# Patient Record
Sex: Female | Born: 1935
Health system: Southern US, Community
[De-identification: ages and names within clinical notes are randomized; demographics above are authoritative.]

## PROBLEM LIST (undated history)

## (undated) DIAGNOSIS — G20 Parkinson's disease: Secondary | ICD-10-CM

## (undated) DIAGNOSIS — I10 Essential (primary) hypertension: Secondary | ICD-10-CM

## (undated) DIAGNOSIS — F419 Anxiety disorder, unspecified: Secondary | ICD-10-CM

## (undated) DIAGNOSIS — F329 Major depressive disorder, single episode, unspecified: Secondary | ICD-10-CM

## (undated) DIAGNOSIS — K219 Gastro-esophageal reflux disease without esophagitis: Secondary | ICD-10-CM

## (undated) DIAGNOSIS — F32A Depression, unspecified: Secondary | ICD-10-CM

## (undated) DIAGNOSIS — G20A1 Parkinson's disease without dyskinesia, without mention of fluctuations: Secondary | ICD-10-CM

## (undated) DIAGNOSIS — E119 Type 2 diabetes mellitus without complications: Secondary | ICD-10-CM

## (undated) DIAGNOSIS — H9190 Unspecified hearing loss, unspecified ear: Secondary | ICD-10-CM

## (undated) DIAGNOSIS — C8590 Non-Hodgkin lymphoma, unspecified, unspecified site: Secondary | ICD-10-CM

## (undated) HISTORY — PX: MASTECTOMY: SHX3

## (undated) HISTORY — DX: Gastro-esophageal reflux disease without esophagitis: K21.9

## (undated) HISTORY — DX: Unspecified hearing loss, unspecified ear: H91.90

## (undated) HISTORY — PX: OTHER SURGICAL HISTORY: SHX169

## (undated) HISTORY — DX: Type 2 diabetes mellitus without complications: E11.9

---

## 1998-03-20 ENCOUNTER — Other Ambulatory Visit: Admission: RE | Admit: 1998-03-20 | Discharge: 1998-03-20 | Payer: Self-pay | Admitting: Obstetrics and Gynecology

## 1998-10-31 ENCOUNTER — Ambulatory Visit (HOSPITAL_BASED_OUTPATIENT_CLINIC_OR_DEPARTMENT_OTHER): Admission: RE | Admit: 1998-10-31 | Discharge: 1998-10-31 | Payer: Self-pay | Admitting: Otolaryngology

## 1999-03-27 ENCOUNTER — Other Ambulatory Visit: Admission: RE | Admit: 1999-03-27 | Discharge: 1999-03-27 | Payer: Self-pay | Admitting: Obstetrics and Gynecology

## 1999-08-14 ENCOUNTER — Ambulatory Visit (HOSPITAL_COMMUNITY): Admission: RE | Admit: 1999-08-14 | Discharge: 1999-08-14 | Payer: Self-pay | Admitting: Family Medicine

## 1999-08-14 ENCOUNTER — Encounter: Payer: Self-pay | Admitting: Family Medicine

## 1999-08-25 ENCOUNTER — Encounter: Payer: Self-pay | Admitting: *Deleted

## 1999-08-25 ENCOUNTER — Ambulatory Visit (HOSPITAL_COMMUNITY): Admission: RE | Admit: 1999-08-25 | Discharge: 1999-08-25 | Payer: Self-pay | Admitting: *Deleted

## 2000-04-02 ENCOUNTER — Other Ambulatory Visit: Admission: RE | Admit: 2000-04-02 | Discharge: 2000-04-02 | Payer: Self-pay | Admitting: Obstetrics and Gynecology

## 2000-04-29 ENCOUNTER — Ambulatory Visit (HOSPITAL_COMMUNITY): Admission: RE | Admit: 2000-04-29 | Discharge: 2000-04-29 | Payer: Self-pay | Admitting: *Deleted

## 2000-04-29 ENCOUNTER — Encounter (INDEPENDENT_AMBULATORY_CARE_PROVIDER_SITE_OTHER): Payer: Self-pay | Admitting: *Deleted

## 2000-10-13 ENCOUNTER — Ambulatory Visit (HOSPITAL_COMMUNITY): Admission: RE | Admit: 2000-10-13 | Discharge: 2000-10-13 | Payer: Self-pay | Admitting: Gastroenterology

## 2000-10-13 ENCOUNTER — Encounter (INDEPENDENT_AMBULATORY_CARE_PROVIDER_SITE_OTHER): Payer: Self-pay | Admitting: Specialist

## 2001-05-30 ENCOUNTER — Other Ambulatory Visit: Admission: RE | Admit: 2001-05-30 | Discharge: 2001-05-30 | Payer: Self-pay | Admitting: Obstetrics and Gynecology

## 2002-02-16 ENCOUNTER — Ambulatory Visit (HOSPITAL_COMMUNITY): Admission: RE | Admit: 2002-02-16 | Discharge: 2002-02-16 | Payer: Self-pay | Admitting: Family Medicine

## 2002-02-16 ENCOUNTER — Encounter: Payer: Self-pay | Admitting: Family Medicine

## 2002-11-01 ENCOUNTER — Encounter: Payer: Self-pay | Admitting: Obstetrics and Gynecology

## 2002-11-01 ENCOUNTER — Encounter: Admission: RE | Admit: 2002-11-01 | Discharge: 2002-11-01 | Payer: Self-pay | Admitting: Obstetrics and Gynecology

## 2003-01-02 ENCOUNTER — Encounter: Admission: RE | Admit: 2003-01-02 | Discharge: 2003-01-02 | Payer: Self-pay | Admitting: Obstetrics and Gynecology

## 2003-01-02 ENCOUNTER — Encounter: Payer: Self-pay | Admitting: Obstetrics and Gynecology

## 2003-03-05 ENCOUNTER — Encounter: Admission: RE | Admit: 2003-03-05 | Discharge: 2003-03-05 | Payer: Self-pay | Admitting: Obstetrics and Gynecology

## 2003-03-05 ENCOUNTER — Encounter: Payer: Self-pay | Admitting: Obstetrics and Gynecology

## 2003-03-19 ENCOUNTER — Ambulatory Visit (HOSPITAL_COMMUNITY): Admission: RE | Admit: 2003-03-19 | Discharge: 2003-03-19 | Payer: Self-pay | Admitting: Obstetrics and Gynecology

## 2003-03-19 ENCOUNTER — Encounter: Payer: Self-pay | Admitting: Obstetrics and Gynecology

## 2003-03-22 ENCOUNTER — Encounter: Admission: RE | Admit: 2003-03-22 | Discharge: 2003-03-22 | Payer: Self-pay | Admitting: Obstetrics and Gynecology

## 2003-03-22 ENCOUNTER — Encounter: Payer: Self-pay | Admitting: Obstetrics and Gynecology

## 2003-04-12 ENCOUNTER — Other Ambulatory Visit: Admission: RE | Admit: 2003-04-12 | Discharge: 2003-04-12 | Payer: Self-pay | Admitting: *Deleted

## 2003-04-16 ENCOUNTER — Encounter: Payer: Self-pay | Admitting: Surgery

## 2003-04-16 ENCOUNTER — Encounter: Admission: RE | Admit: 2003-04-16 | Discharge: 2003-04-16 | Payer: Self-pay | Admitting: Surgery

## 2003-04-17 ENCOUNTER — Ambulatory Visit (HOSPITAL_BASED_OUTPATIENT_CLINIC_OR_DEPARTMENT_OTHER): Admission: RE | Admit: 2003-04-17 | Discharge: 2003-04-17 | Payer: Self-pay | Admitting: Surgery

## 2003-05-14 ENCOUNTER — Other Ambulatory Visit: Admission: RE | Admit: 2003-05-14 | Discharge: 2003-05-14 | Payer: Self-pay | Admitting: Oncology

## 2003-05-14 ENCOUNTER — Encounter (INDEPENDENT_AMBULATORY_CARE_PROVIDER_SITE_OTHER): Payer: Self-pay | Admitting: Specialist

## 2003-07-02 ENCOUNTER — Encounter: Admission: RE | Admit: 2003-07-02 | Discharge: 2003-07-02 | Payer: Self-pay | Admitting: Oncology

## 2003-09-10 ENCOUNTER — Encounter: Admission: RE | Admit: 2003-09-10 | Discharge: 2003-09-10 | Payer: Self-pay | Admitting: Oncology

## 2003-11-12 ENCOUNTER — Encounter: Admission: RE | Admit: 2003-11-12 | Discharge: 2003-11-12 | Payer: Self-pay | Admitting: Oncology

## 2003-12-25 ENCOUNTER — Encounter: Admission: RE | Admit: 2003-12-25 | Discharge: 2003-12-25 | Payer: Self-pay | Admitting: Oncology

## 2004-02-05 ENCOUNTER — Encounter: Payer: Self-pay | Admitting: Thoracic Surgery

## 2004-02-05 ENCOUNTER — Encounter (INDEPENDENT_AMBULATORY_CARE_PROVIDER_SITE_OTHER): Payer: Self-pay | Admitting: *Deleted

## 2004-02-05 ENCOUNTER — Inpatient Hospital Stay (HOSPITAL_COMMUNITY): Admission: RE | Admit: 2004-02-05 | Discharge: 2004-02-09 | Payer: Self-pay | Admitting: Thoracic Surgery

## 2004-02-21 ENCOUNTER — Encounter: Admission: RE | Admit: 2004-02-21 | Discharge: 2004-02-21 | Payer: Self-pay | Admitting: Thoracic Surgery

## 2004-03-13 ENCOUNTER — Encounter: Admission: RE | Admit: 2004-03-13 | Discharge: 2004-03-13 | Payer: Self-pay | Admitting: Thoracic Surgery

## 2004-03-24 ENCOUNTER — Encounter: Admission: RE | Admit: 2004-03-24 | Discharge: 2004-03-24 | Payer: Self-pay | Admitting: Oncology

## 2004-05-27 ENCOUNTER — Encounter: Admission: RE | Admit: 2004-05-27 | Discharge: 2004-05-27 | Payer: Self-pay | Admitting: Oncology

## 2004-06-02 ENCOUNTER — Ambulatory Visit: Payer: Self-pay | Admitting: Oncology

## 2004-06-03 ENCOUNTER — Ambulatory Visit: Payer: Self-pay | Admitting: Oncology

## 2004-06-25 ENCOUNTER — Encounter: Admission: RE | Admit: 2004-06-25 | Discharge: 2004-06-25 | Payer: Self-pay | Admitting: Thoracic Surgery

## 2004-08-11 ENCOUNTER — Encounter: Admission: RE | Admit: 2004-08-11 | Discharge: 2004-08-11 | Payer: Self-pay | Admitting: Oncology

## 2004-08-12 ENCOUNTER — Ambulatory Visit: Payer: Self-pay | Admitting: Oncology

## 2004-11-12 ENCOUNTER — Ambulatory Visit: Payer: Self-pay | Admitting: Oncology

## 2004-11-17 ENCOUNTER — Encounter: Admission: RE | Admit: 2004-11-17 | Discharge: 2004-11-17 | Payer: Self-pay | Admitting: Oncology

## 2005-01-01 ENCOUNTER — Encounter: Admission: RE | Admit: 2005-01-01 | Discharge: 2005-01-01 | Payer: Self-pay | Admitting: Oncology

## 2005-01-02 ENCOUNTER — Ambulatory Visit: Payer: Self-pay | Admitting: Oncology

## 2005-03-12 ENCOUNTER — Ambulatory Visit: Payer: Self-pay | Admitting: Oncology

## 2005-04-06 ENCOUNTER — Encounter: Admission: RE | Admit: 2005-04-06 | Discharge: 2005-04-06 | Payer: Self-pay | Admitting: Oncology

## 2005-05-05 ENCOUNTER — Ambulatory Visit: Payer: Self-pay | Admitting: Oncology

## 2005-05-06 ENCOUNTER — Ambulatory Visit (HOSPITAL_COMMUNITY): Admission: RE | Admit: 2005-05-06 | Discharge: 2005-05-06 | Payer: Self-pay | Admitting: Oncology

## 2005-06-30 ENCOUNTER — Encounter: Admission: RE | Admit: 2005-06-30 | Discharge: 2005-06-30 | Payer: Self-pay | Admitting: Oncology

## 2005-07-02 ENCOUNTER — Ambulatory Visit: Payer: Self-pay | Admitting: Oncology

## 2005-08-24 ENCOUNTER — Ambulatory Visit: Payer: Self-pay | Admitting: Oncology

## 2005-09-14 ENCOUNTER — Ambulatory Visit (HOSPITAL_COMMUNITY): Admission: RE | Admit: 2005-09-14 | Discharge: 2005-09-14 | Payer: Self-pay | Admitting: Oncology

## 2005-09-14 ENCOUNTER — Encounter (HOSPITAL_COMMUNITY): Admission: RE | Admit: 2005-09-14 | Discharge: 2005-11-05 | Payer: Self-pay | Admitting: Oncology

## 2005-10-12 ENCOUNTER — Ambulatory Visit: Payer: Self-pay | Admitting: Oncology

## 2005-11-30 ENCOUNTER — Ambulatory Visit: Payer: Self-pay | Admitting: Oncology

## 2005-11-30 LAB — CBC WITH DIFFERENTIAL/PLATELET
BASO%: 1.2 % (ref 0.0–2.0)
Eosinophils Absolute: 0.2 10*3/uL (ref 0.0–0.5)
HCT: UNDETERMINED % (ref 34.8–46.6)
MCHC: UNDETERMINED g/dL (ref 32.0–36.0)
MONO#: 0.5 10*3/uL (ref 0.1–0.9)
NEUT#: 5.5 10*3/uL (ref 1.5–6.5)
NEUT%: 69.9 % (ref 39.6–76.8)
Platelets: 240 10*3/uL (ref 145–400)
WBC: 7.9 10*3/uL (ref 3.9–10.0)
lymph#: 1.6 10*3/uL (ref 0.9–3.3)

## 2005-12-14 ENCOUNTER — Encounter: Admission: RE | Admit: 2005-12-14 | Discharge: 2005-12-14 | Payer: Self-pay | Admitting: Oncology

## 2005-12-29 LAB — CBC WITH DIFFERENTIAL/PLATELET
BASO%: 1.6 % (ref 0.0–2.0)
Eosinophils Absolute: 0.2 10*3/uL (ref 0.0–0.5)
LYMPH%: 17.9 % (ref 14.0–48.0)
MCHC: UNDETERMINED g/dL (ref 32.0–36.0)
MONO#: 0.5 10*3/uL (ref 0.1–0.9)
NEUT#: 5.5 10*3/uL (ref 1.5–6.5)
Platelets: 234 10*3/uL (ref 145–400)
RBC: UNDETERMINED 10*6/uL (ref 3.70–5.32)
RDW: UNDETERMINED % (ref 11.3–14.5)
WBC: 7.8 10*3/uL (ref 3.9–10.0)
lymph#: 1.4 10*3/uL (ref 0.9–3.3)

## 2006-01-05 ENCOUNTER — Other Ambulatory Visit: Admission: RE | Admit: 2006-01-05 | Discharge: 2006-01-05 | Payer: Self-pay | Admitting: Obstetrics and Gynecology

## 2006-01-08 ENCOUNTER — Encounter: Admission: RE | Admit: 2006-01-08 | Discharge: 2006-01-08 | Payer: Self-pay | Admitting: Oncology

## 2006-02-09 ENCOUNTER — Ambulatory Visit (HOSPITAL_COMMUNITY): Admission: RE | Admit: 2006-02-09 | Discharge: 2006-02-09 | Payer: Self-pay | Admitting: Oncology

## 2006-02-09 ENCOUNTER — Ambulatory Visit: Payer: Self-pay | Admitting: Oncology

## 2006-02-12 LAB — CBC WITH DIFFERENTIAL/PLATELET
BASO%: 1.8 % (ref 0.0–2.0)
Basophils Absolute: 0.1 10*3/uL (ref 0.0–0.1)
HCT: 30.5 % — ABNORMAL LOW (ref 34.8–46.6)
HGB: 10.6 g/dL — ABNORMAL LOW (ref 11.6–15.9)
MONO#: 0.5 10*3/uL (ref 0.1–0.9)
NEUT%: 70.4 % (ref 39.6–76.8)
WBC: 7.6 10*3/uL (ref 3.9–10.0)
lymph#: 1.3 10*3/uL (ref 0.9–3.3)

## 2006-04-07 ENCOUNTER — Ambulatory Visit (HOSPITAL_COMMUNITY): Admission: RE | Admit: 2006-04-07 | Discharge: 2006-04-07 | Payer: Self-pay | Admitting: Oncology

## 2006-04-07 ENCOUNTER — Ambulatory Visit: Payer: Self-pay | Admitting: Oncology

## 2006-04-09 LAB — CBC WITH DIFFERENTIAL/PLATELET
BASO%: 2.6 % — ABNORMAL HIGH (ref 0.0–2.0)
Basophils Absolute: 0.2 10*3/uL — ABNORMAL HIGH (ref 0.0–0.1)
EOS%: 5.9 % (ref 0.0–7.0)
HGB: 9.6 g/dL — ABNORMAL LOW (ref 11.6–15.9)
MCH: 32.9 pg (ref 26.0–34.0)
MCHC: 36.5 g/dL — ABNORMAL HIGH (ref 32.0–36.0)
MCV: 90.1 fL (ref 81.0–101.0)
MONO%: 8.6 % (ref 0.0–13.0)
RDW: 19.2 % — ABNORMAL HIGH (ref 11.3–14.5)
lymph#: 1.4 10*3/uL (ref 0.9–3.3)

## 2006-06-08 ENCOUNTER — Ambulatory Visit: Payer: Self-pay | Admitting: Oncology

## 2006-06-10 LAB — CBC WITH DIFFERENTIAL/PLATELET
BASO%: 0.9 % (ref 0.0–2.0)
Eosinophils Absolute: 0.1 10*3/uL (ref 0.0–0.5)
LYMPH%: 18 % (ref 14.0–48.0)
MONO#: 0.5 10*3/uL (ref 0.1–0.9)
NEUT#: 6.3 10*3/uL (ref 1.5–6.5)
Platelets: 232 10*3/uL (ref 145–400)
RBC: 2.94 10*6/uL — ABNORMAL LOW (ref 3.70–5.32)
WBC: 8.5 10*3/uL (ref 3.9–10.0)
lymph#: 1.5 10*3/uL (ref 0.9–3.3)

## 2006-06-11 LAB — FOLATE RBC

## 2006-06-11 LAB — VITAMIN B12: Vitamin B-12: 1053 pg/mL — ABNORMAL HIGH (ref 211–911)

## 2006-06-16 LAB — URINALYSIS, MICROSCOPIC - CHCC
Ketones: NEGATIVE mg/dL
Nitrite: POSITIVE
Protein: 300 mg/dL
pH: 6 (ref 4.6–8.0)

## 2006-06-22 LAB — CBC WITH DIFFERENTIAL/PLATELET
BASO%: 1.2 % (ref 0.0–2.0)
Basophils Absolute: 0.1 10*3/uL (ref 0.0–0.1)
EOS%: 1.8 % (ref 0.0–7.0)
Eosinophils Absolute: 0.1 10*3/uL (ref 0.0–0.5)
HCT: 25.3 % — ABNORMAL LOW (ref 34.8–46.6)
HGB: 8.9 g/dL — ABNORMAL LOW (ref 11.6–15.9)
LYMPH%: 17.9 % (ref 14.0–48.0)
MCH: 29.2 pg (ref 26.0–34.0)
MCHC: 34.9 g/dL (ref 32.0–36.0)
MCV: 83.6 fL (ref 81.0–101.0)
MONO#: 0.6 10*3/uL (ref 0.1–0.9)
MONO%: 7 % (ref 0.0–13.0)
NEUT#: 6 10*3/uL (ref 1.5–6.5)
NEUT%: 72.2 % (ref 39.6–76.8)
Platelets: 241 10*3/uL (ref 145–400)
RBC: 3.03 10*6/uL — ABNORMAL LOW (ref 3.70–5.32)
RDW: 15.3 % — ABNORMAL HIGH (ref 11.3–14.5)
WBC: 8.4 10*3/uL (ref 3.9–10.0)
lymph#: 1.5 10*3/uL (ref 0.9–3.3)

## 2006-06-22 LAB — HOLD TUBE, BLOOD BANK

## 2006-07-08 ENCOUNTER — Encounter (HOSPITAL_COMMUNITY): Admission: RE | Admit: 2006-07-08 | Discharge: 2006-07-22 | Payer: Self-pay | Admitting: Oncology

## 2006-07-08 LAB — CBC WITH DIFFERENTIAL/PLATELET
BASO%: 1.1 % (ref 0.0–2.0)
EOS%: 0.9 % (ref 0.0–7.0)
HCT: 26.7 % — ABNORMAL LOW (ref 34.8–46.6)
LYMPH%: 17 % (ref 14.0–48.0)
MCH: 26.6 pg (ref 26.0–34.0)
MCHC: 32.6 g/dL (ref 32.0–36.0)
MONO%: 8.1 % (ref 0.0–13.0)
NEUT%: 72.9 % (ref 39.6–76.8)
Platelets: 289 10*3/uL (ref 145–400)

## 2006-07-13 LAB — COMPREHENSIVE METABOLIC PANEL
AST: 19 U/L (ref 0–37)
Albumin: 4.1 g/dL (ref 3.5–5.2)
Alkaline Phosphatase: 99 U/L (ref 39–117)
BUN: 29 mg/dL — ABNORMAL HIGH (ref 6–23)
Potassium: 3.6 mEq/L (ref 3.5–5.3)
Sodium: 139 mEq/L (ref 135–145)

## 2006-07-13 LAB — CBC WITH DIFFERENTIAL/PLATELET
Basophils Absolute: 0.1 10*3/uL (ref 0.0–0.1)
EOS%: 4.4 % (ref 0.0–7.0)
MCH: 27.1 pg (ref 26.0–34.0)
MCHC: 33.6 g/dL (ref 32.0–36.0)
MCV: 80.5 fL — ABNORMAL LOW (ref 81.0–101.0)
MONO%: 7.5 % (ref 0.0–13.0)
RBC: 4.3 10*6/uL (ref 3.70–5.32)
RDW: 13.4 % (ref 11.3–14.5)

## 2006-07-15 LAB — COMPREHENSIVE METABOLIC PANEL
AST: 24 U/L (ref 0–37)
BUN: 23 mg/dL (ref 6–23)
Calcium: 11.3 mg/dL — ABNORMAL HIGH (ref 8.4–10.5)
Chloride: 99 mEq/L (ref 96–112)
Creatinine, Ser: 1.47 mg/dL — ABNORMAL HIGH (ref 0.40–1.20)

## 2006-07-16 LAB — CBC & DIFF AND RETIC
BASO%: 1.2 % (ref 0.0–2.0)
EOS%: 3.2 % (ref 0.0–7.0)
HCT: 32.1 % — ABNORMAL LOW (ref 34.8–46.6)
IRF: 0.13 (ref 0.130–0.330)
MCH: 28 pg (ref 26.0–34.0)
MCHC: 34 g/dL (ref 32.0–36.0)
NEUT%: 68.3 % (ref 39.6–76.8)
RDW: 15.6 % — ABNORMAL HIGH (ref 11.3–14.5)
lymph#: 1 10*3/uL (ref 0.9–3.3)

## 2006-07-16 LAB — COMPREHENSIVE METABOLIC PANEL
ALT: 13 U/L (ref 0–35)
CO2: 29 mEq/L (ref 19–32)
Creatinine, Ser: 1.17 mg/dL (ref 0.40–1.20)
Total Bilirubin: 0.6 mg/dL (ref 0.3–1.2)

## 2006-07-16 LAB — PTH, INTACT AND CALCIUM
Calcium, Total (PTH): 11.1 mg/dL — ABNORMAL HIGH (ref 8.4–10.5)
PTH: <2.5 pg/mL — ABNORMAL LOW (ref 14.0–72.0)

## 2006-07-16 LAB — CHCC SMEAR

## 2006-07-19 ENCOUNTER — Ambulatory Visit: Payer: Self-pay | Admitting: Oncology

## 2006-07-22 LAB — COMPREHENSIVE METABOLIC PANEL
AST: 20 U/L (ref 0–37)
Alkaline Phosphatase: 90 U/L (ref 39–117)
BUN: 10 mg/dL (ref 6–23)
Calcium: 9.7 mg/dL (ref 8.4–10.5)
Creatinine, Ser: 1.02 mg/dL (ref 0.40–1.20)
Glucose, Bld: 102 mg/dL — ABNORMAL HIGH (ref 70–99)

## 2006-07-22 LAB — CBC WITH DIFFERENTIAL/PLATELET
Basophils Absolute: 0.1 10*3/uL (ref 0.0–0.1)
Eosinophils Absolute: 0.2 10*3/uL (ref 0.0–0.5)
HCT: 33.4 % — ABNORMAL LOW (ref 34.8–46.6)
HGB: 11.2 g/dL — ABNORMAL LOW (ref 11.6–15.9)
LYMPH%: 18.8 % (ref 14.0–48.0)
MCHC: 33.7 g/dL (ref 32.0–36.0)
MONO#: 0.4 10*3/uL (ref 0.1–0.9)
NEUT#: 3.5 10*3/uL (ref 1.5–6.5)
NEUT%: 69.3 % (ref 39.6–76.8)
Platelets: 221 10*3/uL (ref 145–400)
WBC: 5.1 10*3/uL (ref 3.9–10.0)

## 2006-07-22 LAB — FERRITIN: Ferritin: 440 ng/mL — ABNORMAL HIGH (ref 10–291)

## 2006-07-29 LAB — LACTATE DEHYDROGENASE: LDH: 159 U/L (ref 94–250)

## 2006-07-29 LAB — CBC & DIFF AND RETIC
Basophils Absolute: 0.1 10*3/uL (ref 0.0–0.1)
HCT: 34 % — ABNORMAL LOW (ref 34.8–46.6)
HGB: 11.4 g/dL — ABNORMAL LOW (ref 11.6–15.9)
IRF: 0.27 (ref 0.130–0.330)
LYMPH%: 18.6 % (ref 14.0–48.0)
MCH: 27.5 pg (ref 26.0–34.0)
MONO#: 0.4 10*3/uL (ref 0.1–0.9)
NEUT%: 71 % (ref 39.6–76.8)
Platelets: 232 10*3/uL (ref 145–400)
WBC: 6 10*3/uL (ref 3.9–10.0)
lymph#: 1.1 10*3/uL (ref 0.9–3.3)

## 2006-07-29 LAB — HOLD TUBE, BLOOD BANK

## 2006-07-29 LAB — COMPREHENSIVE METABOLIC PANEL
ALT: 17 U/L (ref 0–35)
AST: 22 U/L (ref 0–37)
Albumin: 3.7 g/dL (ref 3.5–5.2)
Calcium: 9.2 mg/dL (ref 8.4–10.5)
Chloride: 101 mEq/L (ref 96–112)
Potassium: 4.3 mEq/L (ref 3.5–5.3)
Sodium: 138 mEq/L (ref 135–145)
Total Protein: 6.4 g/dL (ref 6.0–8.3)

## 2006-08-03 LAB — CBC WITH DIFFERENTIAL/PLATELET
BASO%: 1.6 % (ref 0.0–2.0)
HCT: 31.9 % — ABNORMAL LOW (ref 34.8–46.6)
MCHC: 34.2 g/dL (ref 32.0–36.0)
MONO#: 0.3 10*3/uL (ref 0.1–0.9)
NEUT%: 66.8 % (ref 39.6–76.8)
RDW: 12.9 % (ref 11.3–14.5)
WBC: 5 10*3/uL (ref 3.9–10.0)
lymph#: 1 10*3/uL (ref 0.9–3.3)

## 2006-08-13 LAB — COMPREHENSIVE METABOLIC PANEL
Albumin: 4 g/dL (ref 3.5–5.2)
BUN: 14 mg/dL (ref 6–23)
Calcium: 8.9 mg/dL (ref 8.4–10.5)
Chloride: 108 mEq/L (ref 96–112)
Glucose, Bld: 77 mg/dL (ref 70–99)
Potassium: 4.3 mEq/L (ref 3.5–5.3)
Sodium: 141 mEq/L (ref 135–145)
Total Protein: 6.6 g/dL (ref 6.0–8.3)

## 2006-08-13 LAB — CBC WITH DIFFERENTIAL/PLATELET
Basophils Absolute: 0.1 10*3/uL (ref 0.0–0.1)
Eosinophils Absolute: 0.2 10*3/uL (ref 0.0–0.5)
HCT: 34.4 % — ABNORMAL LOW (ref 34.8–46.6)
HGB: 11.6 g/dL (ref 11.6–15.9)
MONO#: 0.3 10*3/uL (ref 0.1–0.9)
NEUT#: 2.9 10*3/uL (ref 1.5–6.5)
NEUT%: 63.7 % (ref 39.6–76.8)
RDW: 13.4 % (ref 11.3–14.5)
WBC: 4.6 10*3/uL (ref 3.9–10.0)
lymph#: 1.1 10*3/uL (ref 0.9–3.3)

## 2006-08-24 LAB — CBC WITH DIFFERENTIAL/PLATELET
BASO%: 1.5 % (ref 0.0–2.0)
LYMPH%: 19.1 % (ref 14.0–48.0)
MCH: 28 pg (ref 26.0–34.0)
MCHC: 33.5 g/dL (ref 32.0–36.0)
MCV: 83.7 fL (ref 81.0–101.0)
MONO%: 6.5 % (ref 0.0–13.0)
Platelets: 201 10*3/uL (ref 145–400)
RBC: 3.92 10*6/uL (ref 3.70–5.32)
WBC: 4.7 10*3/uL (ref 3.9–10.0)

## 2006-08-24 LAB — HOLD TUBE, BLOOD BANK

## 2006-08-24 LAB — COMPREHENSIVE METABOLIC PANEL
ALT: 17 U/L (ref 0–35)
AST: 22 U/L (ref 0–37)
Creatinine, Ser: 0.89 mg/dL (ref 0.40–1.20)
Total Bilirubin: 0.8 mg/dL (ref 0.3–1.2)

## 2006-09-14 ENCOUNTER — Ambulatory Visit: Payer: Self-pay | Admitting: Oncology

## 2006-09-17 LAB — COMPREHENSIVE METABOLIC PANEL
ALT: 22 U/L (ref 0–35)
BUN: 22 mg/dL (ref 6–23)
CO2: 29 mEq/L (ref 19–32)
Calcium: 9.3 mg/dL (ref 8.4–10.5)
Creatinine, Ser: 0.88 mg/dL (ref 0.40–1.20)
Total Bilirubin: 0.6 mg/dL (ref 0.3–1.2)

## 2006-09-17 LAB — CBC WITH DIFFERENTIAL/PLATELET
BASO%: 1 % (ref 0.0–2.0)
Basophils Absolute: 0.1 10*3/uL (ref 0.0–0.1)
HCT: 33.6 % — ABNORMAL LOW (ref 34.8–46.6)
HGB: 11.8 g/dL (ref 11.6–15.9)
LYMPH%: 18.7 % (ref 14.0–48.0)
MCH: 29.8 pg (ref 26.0–34.0)
MCHC: 35.2 g/dL (ref 32.0–36.0)
MONO#: 0.4 10*3/uL (ref 0.1–0.9)
NEUT%: 69.3 % (ref 39.6–76.8)
Platelets: 200 10*3/uL (ref 145–400)
WBC: 5.7 10*3/uL (ref 3.9–10.0)

## 2006-09-17 LAB — LACTATE DEHYDROGENASE: LDH: 171 U/L (ref 94–250)

## 2006-10-04 LAB — CBC WITH DIFFERENTIAL/PLATELET
Basophils Absolute: 0 10*3/uL (ref 0.0–0.1)
EOS%: 3.4 % (ref 0.0–7.0)
Eosinophils Absolute: 0.2 10*3/uL (ref 0.0–0.5)
HCT: 35.5 % (ref 34.8–46.6)
HGB: 12 g/dL (ref 11.6–15.9)
MCH: 28.3 pg (ref 26.0–34.0)
MCV: 83.9 fL (ref 81.0–101.0)
MONO%: 5.9 % (ref 0.0–13.0)
NEUT#: 4 10*3/uL (ref 1.5–6.5)
NEUT%: 73 % (ref 39.6–76.8)
Platelets: 196 10*3/uL (ref 145–400)
RDW: 11.7 % (ref 11.3–14.5)

## 2006-10-04 LAB — COMPREHENSIVE METABOLIC PANEL
AST: 20 U/L (ref 0–37)
Albumin: 4.1 g/dL (ref 3.5–5.2)
Alkaline Phosphatase: 113 U/L (ref 39–117)
BUN: 15 mg/dL (ref 6–23)
Calcium: 8.9 mg/dL (ref 8.4–10.5)
Creatinine, Ser: 0.82 mg/dL (ref 0.40–1.20)
Glucose, Bld: 85 mg/dL (ref 70–99)
Potassium: 4.2 mEq/L (ref 3.5–5.3)

## 2006-11-10 ENCOUNTER — Ambulatory Visit: Payer: Self-pay | Admitting: Oncology

## 2006-11-15 LAB — CBC WITH DIFFERENTIAL/PLATELET
Basophils Absolute: 0.1 10*3/uL (ref 0.0–0.1)
Eosinophils Absolute: 0.2 10*3/uL (ref 0.0–0.5)
HGB: 10.5 g/dL — ABNORMAL LOW (ref 11.6–15.9)
MONO#: 0.4 10*3/uL (ref 0.1–0.9)
NEUT#: 3.7 10*3/uL (ref 1.5–6.5)
Platelets: 189 10*3/uL (ref 145–400)
RBC: 3.43 10*6/uL — ABNORMAL LOW (ref 3.70–5.32)
RDW: 13.4 % (ref 11.3–14.5)
WBC: 5.2 10*3/uL (ref 3.9–10.0)

## 2006-11-15 LAB — COMPREHENSIVE METABOLIC PANEL
Albumin: 3.8 g/dL (ref 3.5–5.2)
BUN: 11 mg/dL (ref 6–23)
CO2: 26 mEq/L (ref 19–32)
Calcium: 8.8 mg/dL (ref 8.4–10.5)
Glucose, Bld: 88 mg/dL (ref 70–99)
Potassium: 4.1 mEq/L (ref 3.5–5.3)
Sodium: 141 mEq/L (ref 135–145)
Total Protein: 7 g/dL (ref 6.0–8.3)

## 2006-11-15 LAB — LACTATE DEHYDROGENASE: LDH: 214 U/L (ref 94–250)

## 2006-12-07 LAB — CBC WITH DIFFERENTIAL/PLATELET
Basophils Absolute: 0.1 10*3/uL (ref 0.0–0.1)
EOS%: 3.2 % (ref 0.0–7.0)
HCT: 30.8 % — ABNORMAL LOW (ref 34.8–46.6)
HGB: 10.5 g/dL — ABNORMAL LOW (ref 11.6–15.9)
MCH: 29.1 pg (ref 26.0–34.0)
MONO#: 0.4 10*3/uL (ref 0.1–0.9)
NEUT%: 66.3 % (ref 39.6–76.8)
lymph#: 1.4 10*3/uL (ref 0.9–3.3)

## 2006-12-17 ENCOUNTER — Encounter: Admission: RE | Admit: 2006-12-17 | Discharge: 2006-12-17 | Payer: Self-pay | Admitting: Oncology

## 2006-12-22 ENCOUNTER — Ambulatory Visit: Payer: Self-pay | Admitting: Oncology

## 2006-12-24 LAB — CBC WITH DIFFERENTIAL/PLATELET
Basophils Absolute: 0.1 10*3/uL (ref 0.0–0.1)
EOS%: 2.4 % (ref 0.0–7.0)
HCT: 28.3 % — ABNORMAL LOW (ref 34.8–46.6)
HGB: 10.3 g/dL — ABNORMAL LOW (ref 11.6–15.9)
MCH: 30.8 pg (ref 26.0–34.0)
MCV: 84.4 fL (ref 81.0–101.0)
MONO%: 6.5 % (ref 0.0–13.0)
NEUT%: 73.3 % (ref 39.6–76.8)
Platelets: 193 10*3/uL (ref 145–400)

## 2006-12-24 LAB — COMPREHENSIVE METABOLIC PANEL
AST: 18 U/L (ref 0–37)
Alkaline Phosphatase: 118 U/L — ABNORMAL HIGH (ref 39–117)
BUN: 17 mg/dL (ref 6–23)
Calcium: 8.9 mg/dL (ref 8.4–10.5)
Creatinine, Ser: 0.88 mg/dL (ref 0.40–1.20)

## 2007-01-26 ENCOUNTER — Ambulatory Visit (HOSPITAL_COMMUNITY): Admission: RE | Admit: 2007-01-26 | Discharge: 2007-01-26 | Payer: Self-pay | Admitting: Oncology

## 2007-02-01 ENCOUNTER — Ambulatory Visit: Payer: Self-pay | Admitting: Oncology

## 2007-02-01 LAB — CBC & DIFF AND RETIC
Basophils Absolute: 0 10*3/uL (ref 0.0–0.1)
Eosinophils Absolute: 0.1 10*3/uL (ref 0.0–0.5)
HGB: 10.3 g/dL — ABNORMAL LOW (ref 11.6–15.9)
MCV: 97.2 fL (ref 81.0–101.0)
MONO#: 0.4 10*3/uL (ref 0.1–0.9)
MONO%: 6.6 % (ref 0.0–13.0)
NEUT#: 5.1 10*3/uL (ref 1.5–6.5)
Platelets: 196 10*3/uL (ref 145–400)
RDW: 14.6 % — ABNORMAL HIGH (ref 11.3–14.5)
RETIC #: 112.2 10*3/uL (ref 19.7–115.1)
WBC: 6.7 10*3/uL (ref 3.9–10.0)

## 2007-02-01 LAB — COMPREHENSIVE METABOLIC PANEL
AST: 22 U/L (ref 0–37)
Alkaline Phosphatase: 113 U/L (ref 39–117)
BUN: 17 mg/dL (ref 6–23)
Calcium: 9 mg/dL (ref 8.4–10.5)
Creatinine, Ser: 0.96 mg/dL (ref 0.40–1.20)
Total Bilirubin: 0.8 mg/dL (ref 0.3–1.2)

## 2007-03-16 LAB — COMPREHENSIVE METABOLIC PANEL
AST: 20 U/L (ref 0–37)
BUN: 15 mg/dL (ref 6–23)
CO2: 27 mEq/L (ref 19–32)
Calcium: 9.1 mg/dL (ref 8.4–10.5)
Chloride: 105 mEq/L (ref 96–112)
Creatinine, Ser: 0.83 mg/dL (ref 0.40–1.20)

## 2007-03-16 LAB — CBC WITH DIFFERENTIAL/PLATELET
Basophils Absolute: 0.1 10*3/uL (ref 0.0–0.1)
EOS%: 2.9 % (ref 0.0–7.0)
HCT: 30.7 % — ABNORMAL LOW (ref 34.8–46.6)
HGB: 10.5 g/dL — ABNORMAL LOW (ref 11.6–15.9)
LYMPH%: 17.4 % (ref 14.0–48.0)
MCH: 28.9 pg (ref 26.0–34.0)
MCV: 84.7 fL (ref 81.0–101.0)
NEUT%: 72.6 % (ref 39.6–76.8)
Platelets: 162 10*3/uL (ref 145–400)
lymph#: 1.3 10*3/uL (ref 0.9–3.3)

## 2007-04-15 ENCOUNTER — Ambulatory Visit: Payer: Self-pay | Admitting: Oncology

## 2007-04-19 LAB — CBC & DIFF AND RETIC
Basophils Absolute: 0 10*3/uL (ref 0.0–0.1)
HCT: 30.5 % — ABNORMAL LOW (ref 34.8–46.6)
HGB: 10.7 g/dL — ABNORMAL LOW (ref 11.6–15.9)
IRF: 0.32 (ref 0.130–0.330)
MONO#: 0.4 10*3/uL (ref 0.1–0.9)
NEUT%: 70.5 % (ref 39.6–76.8)
WBC: 6.7 10*3/uL (ref 3.9–10.0)
lymph#: 1.4 10*3/uL (ref 0.9–3.3)

## 2007-04-19 LAB — COMPREHENSIVE METABOLIC PANEL
ALT: 15 U/L (ref 0–35)
CO2: 27 mEq/L (ref 19–32)
Calcium: 9.2 mg/dL (ref 8.4–10.5)
Chloride: 106 mEq/L (ref 96–112)
Sodium: 142 mEq/L (ref 135–145)
Total Protein: 7.2 g/dL (ref 6.0–8.3)

## 2007-06-13 ENCOUNTER — Ambulatory Visit (HOSPITAL_COMMUNITY): Admission: RE | Admit: 2007-06-13 | Discharge: 2007-06-13 | Payer: Self-pay | Admitting: Oncology

## 2007-06-14 ENCOUNTER — Ambulatory Visit: Payer: Self-pay | Admitting: Oncology

## 2007-06-16 LAB — CBC & DIFF AND RETIC
Basophils Absolute: 0 10*3/uL (ref 0.0–0.1)
EOS%: 2.5 % (ref 0.0–7.0)
MCH: 28.4 pg (ref 26.0–34.0)
MCV: 80.8 fL — ABNORMAL LOW (ref 81.0–101.0)
MONO%: 6.7 % (ref 0.0–13.0)
RBC: 3.92 10*6/uL (ref 3.70–5.32)
RDW: 14.7 % — ABNORMAL HIGH (ref 11.3–14.5)
RETIC #: 86.6 10*3/uL (ref 19.7–115.1)
Retic %: 2.2 % (ref 0.4–2.3)

## 2007-06-16 LAB — COMPREHENSIVE METABOLIC PANEL
AST: 20 U/L (ref 0–37)
BUN: 18 mg/dL (ref 6–23)
Calcium: 9.2 mg/dL (ref 8.4–10.5)
Chloride: 105 mEq/L (ref 96–112)
Creatinine, Ser: 0.82 mg/dL (ref 0.40–1.20)

## 2007-07-15 ENCOUNTER — Ambulatory Visit (HOSPITAL_COMMUNITY): Admission: RE | Admit: 2007-07-15 | Discharge: 2007-07-15 | Payer: Self-pay | Admitting: Oncology

## 2007-07-15 LAB — COMPREHENSIVE METABOLIC PANEL
Albumin: 4 g/dL (ref 3.5–5.2)
BUN: 16 mg/dL (ref 6–23)
CO2: 26 mEq/L (ref 19–32)
Calcium: 9.7 mg/dL (ref 8.4–10.5)
Chloride: 104 mEq/L (ref 96–112)
Glucose, Bld: 105 mg/dL — ABNORMAL HIGH (ref 70–99)
Potassium: 4.2 mEq/L (ref 3.5–5.3)

## 2007-07-15 LAB — CBC WITH DIFFERENTIAL/PLATELET
Basophils Absolute: 0.1 10*3/uL (ref 0.0–0.1)
Eosinophils Absolute: 0.1 10*3/uL (ref 0.0–0.5)
HGB: 10.8 g/dL — ABNORMAL LOW (ref 11.6–15.9)
MCV: 81.5 fL (ref 81.0–101.0)
MONO#: 0.4 10*3/uL (ref 0.1–0.9)
MONO%: 5.4 % (ref 0.0–13.0)
NEUT#: 4.9 10*3/uL (ref 1.5–6.5)
RDW: 11.8 % (ref 11.3–14.5)
lymph#: 1.2 10*3/uL (ref 0.9–3.3)

## 2007-07-15 LAB — LACTATE DEHYDROGENASE: LDH: 231 U/L (ref 94–250)

## 2007-08-18 ENCOUNTER — Ambulatory Visit: Payer: Self-pay | Admitting: Oncology

## 2007-08-23 LAB — CBC WITH DIFFERENTIAL/PLATELET
BASO%: 1.1 % (ref 0.0–2.0)
Basophils Absolute: 0.1 10*3/uL (ref 0.0–0.1)
EOS%: 1.6 % (ref 0.0–7.0)
MCH: 27.6 pg (ref 26.0–34.0)
MCHC: 34.3 g/dL (ref 32.0–36.0)
MCV: 80.4 fL — ABNORMAL LOW (ref 81.0–101.0)
MONO%: 6.7 % (ref 0.0–13.0)
RDW: 11.6 % (ref 11.3–14.5)
lymph#: 0.8 10*3/uL — ABNORMAL LOW (ref 0.9–3.3)

## 2007-08-23 LAB — COMPREHENSIVE METABOLIC PANEL
ALT: 16 U/L (ref 0–35)
AST: 24 U/L (ref 0–37)
Albumin: 4 g/dL (ref 3.5–5.2)
Alkaline Phosphatase: 109 U/L (ref 39–117)
BUN: 17 mg/dL (ref 6–23)
Calcium: 9.5 mg/dL (ref 8.4–10.5)
Chloride: 102 mEq/L (ref 96–112)
Creatinine, Ser: 0.91 mg/dL (ref 0.40–1.20)
Potassium: 4.2 mEq/L (ref 3.5–5.3)

## 2007-10-04 ENCOUNTER — Ambulatory Visit: Payer: Self-pay | Admitting: Oncology

## 2007-10-06 LAB — CBC WITH DIFFERENTIAL/PLATELET
BASO%: 1.6 % (ref 0.0–2.0)
EOS%: 3.6 % (ref 0.0–7.0)
HCT: 33.2 % — ABNORMAL LOW (ref 34.8–46.6)
LYMPH%: 18.4 % (ref 14.0–48.0)
MCH: 27.3 pg (ref 26.0–34.0)
MCHC: 33.9 g/dL (ref 32.0–36.0)
NEUT%: 68.9 % (ref 39.6–76.8)
Platelets: 170 10*3/uL (ref 145–400)
RBC: 4.13 10*6/uL (ref 3.70–5.32)
lymph#: 1 10*3/uL (ref 0.9–3.3)

## 2007-10-06 LAB — COMPREHENSIVE METABOLIC PANEL
ALT: 12 U/L (ref 0–35)
AST: 19 U/L (ref 0–37)
Creatinine, Ser: 0.9 mg/dL (ref 0.40–1.20)
Total Bilirubin: 0.5 mg/dL (ref 0.3–1.2)

## 2007-11-30 ENCOUNTER — Ambulatory Visit: Payer: Self-pay | Admitting: Oncology

## 2007-12-02 ENCOUNTER — Ambulatory Visit (HOSPITAL_COMMUNITY): Admission: RE | Admit: 2007-12-02 | Discharge: 2007-12-02 | Payer: Self-pay | Admitting: Oncology

## 2007-12-02 LAB — CBC WITH DIFFERENTIAL/PLATELET
BASO%: 1.4 % (ref 0.0–2.0)
Eosinophils Absolute: 0.2 10*3/uL (ref 0.0–0.5)
HCT: 32.8 % — ABNORMAL LOW (ref 34.8–46.6)
MCHC: 33.4 g/dL (ref 32.0–36.0)
MONO#: 0.4 10*3/uL (ref 0.1–0.9)
NEUT#: 3.6 10*3/uL (ref 1.5–6.5)
RBC: 4.14 10*6/uL (ref 3.70–5.32)
WBC: 5.2 10*3/uL (ref 3.9–10.0)
lymph#: 0.9 10*3/uL (ref 0.9–3.3)

## 2007-12-02 LAB — COMPREHENSIVE METABOLIC PANEL
ALT: 17 U/L (ref 0–35)
Albumin: 3.9 g/dL (ref 3.5–5.2)
CO2: 28 mEq/L (ref 19–32)
Calcium: 10.2 mg/dL (ref 8.4–10.5)
Chloride: 106 mEq/L (ref 96–112)
Potassium: 3.9 mEq/L (ref 3.5–5.3)
Sodium: 142 mEq/L (ref 135–145)
Total Protein: 6.6 g/dL (ref 6.0–8.3)

## 2007-12-02 LAB — LACTATE DEHYDROGENASE: LDH: 245 U/L (ref 94–250)

## 2007-12-13 ENCOUNTER — Ambulatory Visit (HOSPITAL_COMMUNITY): Admission: RE | Admit: 2007-12-13 | Discharge: 2007-12-13 | Payer: Self-pay | Admitting: Oncology

## 2007-12-15 ENCOUNTER — Ambulatory Visit (HOSPITAL_COMMUNITY): Admission: RE | Admit: 2007-12-15 | Discharge: 2007-12-15 | Payer: Self-pay | Admitting: Oncology

## 2007-12-21 ENCOUNTER — Ambulatory Visit: Payer: Self-pay

## 2007-12-21 ENCOUNTER — Ambulatory Visit (HOSPITAL_COMMUNITY): Admission: RE | Admit: 2007-12-21 | Discharge: 2007-12-21 | Payer: Self-pay | Admitting: Oncology

## 2007-12-21 ENCOUNTER — Ambulatory Visit: Payer: Self-pay | Admitting: Cardiology

## 2007-12-21 ENCOUNTER — Encounter: Payer: Self-pay | Admitting: Internal Medicine

## 2007-12-22 ENCOUNTER — Ambulatory Visit (HOSPITAL_COMMUNITY): Admission: RE | Admit: 2007-12-22 | Discharge: 2007-12-22 | Payer: Self-pay | Admitting: Oncology

## 2007-12-23 ENCOUNTER — Ambulatory Visit: Payer: Self-pay

## 2007-12-26 ENCOUNTER — Inpatient Hospital Stay (HOSPITAL_COMMUNITY): Admission: AD | Admit: 2007-12-26 | Discharge: 2007-12-30 | Payer: Self-pay | Admitting: Oncology

## 2007-12-26 ENCOUNTER — Ambulatory Visit: Admission: RE | Admit: 2007-12-26 | Discharge: 2007-12-26 | Payer: Self-pay | Admitting: Critical Care Medicine

## 2007-12-26 ENCOUNTER — Ambulatory Visit: Payer: Self-pay | Admitting: Oncology

## 2007-12-27 ENCOUNTER — Ambulatory Visit: Payer: Self-pay | Admitting: Thoracic Surgery

## 2007-12-27 ENCOUNTER — Encounter (INDEPENDENT_AMBULATORY_CARE_PROVIDER_SITE_OTHER): Payer: Self-pay | Admitting: Interventional Radiology

## 2008-01-04 LAB — CBC WITH DIFFERENTIAL/PLATELET
BASO%: 1.5 % (ref 0.0–2.0)
Basophils Absolute: 0.1 10*3/uL (ref 0.0–0.1)
EOS%: 4 % (ref 0.0–7.0)
HCT: 30.4 % — ABNORMAL LOW (ref 34.8–46.6)
HGB: 10.2 g/dL — ABNORMAL LOW (ref 11.6–15.9)
LYMPH%: 6.5 % — ABNORMAL LOW (ref 14.0–48.0)
MCH: 26.4 pg (ref 26.0–34.0)
MCHC: 33.5 g/dL (ref 32.0–36.0)
MCV: 78.7 fL — ABNORMAL LOW (ref 81.0–101.0)
MONO%: 8.8 % (ref 0.0–13.0)
NEUT%: 79.2 % — ABNORMAL HIGH (ref 39.6–76.8)
Platelets: 262 10*3/uL (ref 145–400)
lymph#: 0.3 10*3/uL — ABNORMAL LOW (ref 0.9–3.3)

## 2008-01-16 ENCOUNTER — Ambulatory Visit: Payer: Self-pay | Admitting: Oncology

## 2008-01-16 ENCOUNTER — Ambulatory Visit (HOSPITAL_COMMUNITY): Admission: RE | Admit: 2008-01-16 | Discharge: 2008-01-16 | Payer: Self-pay | Admitting: Oncology

## 2008-01-16 ENCOUNTER — Encounter (HOSPITAL_COMMUNITY): Admission: RE | Admit: 2008-01-16 | Discharge: 2008-04-05 | Payer: Self-pay | Admitting: Oncology

## 2008-01-23 ENCOUNTER — Ambulatory Visit (HOSPITAL_COMMUNITY): Admission: RE | Admit: 2008-01-23 | Discharge: 2008-01-23 | Payer: Self-pay | Admitting: Oncology

## 2008-01-23 ENCOUNTER — Inpatient Hospital Stay (HOSPITAL_COMMUNITY): Admission: AD | Admit: 2008-01-23 | Discharge: 2008-01-25 | Payer: Self-pay | Admitting: Oncology

## 2008-01-23 LAB — BASIC METABOLIC PANEL
BUN: 19 mg/dL (ref 6–23)
Calcium: 9.2 mg/dL (ref 8.4–10.5)
Glucose, Bld: 101 mg/dL — ABNORMAL HIGH (ref 70–99)
Sodium: 140 mEq/L (ref 135–145)

## 2008-01-23 LAB — CBC WITH DIFFERENTIAL/PLATELET
EOS%: 2 % (ref 0.0–7.0)
Eosinophils Absolute: 0.1 10*3/uL (ref 0.0–0.5)
LYMPH%: 7.7 % — ABNORMAL LOW (ref 14.0–48.0)
MCH: 27.6 pg (ref 26.0–34.0)
MCV: 79.6 fL — ABNORMAL LOW (ref 81.0–101.0)
MONO%: 7.4 % (ref 0.0–13.0)
NEUT#: 3.8 10*3/uL (ref 1.5–6.5)
Platelets: 114 10*3/uL — ABNORMAL LOW (ref 145–400)
RBC: 4.37 10*6/uL (ref 3.70–5.32)
RDW: 15.5 % — ABNORMAL HIGH (ref 11.3–14.5)

## 2008-01-26 ENCOUNTER — Ambulatory Visit: Payer: Self-pay | Admitting: Internal Medicine

## 2008-01-30 LAB — BASIC METABOLIC PANEL
BUN: 19 mg/dL (ref 6–23)
CO2: 26 mEq/L (ref 19–32)
Chloride: 103 mEq/L (ref 96–112)
Creatinine, Ser: 1.13 mg/dL (ref 0.40–1.20)
Glucose, Bld: 93 mg/dL (ref 70–99)
Potassium: 5 mEq/L (ref 3.5–5.3)

## 2008-01-30 LAB — CBC WITH DIFFERENTIAL/PLATELET
EOS%: 2 % (ref 0.0–7.0)
LYMPH%: 9.3 % — ABNORMAL LOW (ref 14.0–48.0)
MCH: 27.8 pg (ref 26.0–34.0)
MCHC: 35.2 g/dL (ref 32.0–36.0)
MCV: 78.9 fL — ABNORMAL LOW (ref 81.0–101.0)
MONO%: 7.4 % (ref 0.0–13.0)
Platelets: 172 10*3/uL (ref 145–400)
RBC: 4.1 10*6/uL (ref 3.70–5.32)
RDW: 15.1 % — ABNORMAL HIGH (ref 11.3–14.5)

## 2008-02-08 LAB — CBC WITH DIFFERENTIAL/PLATELET
BASO%: 0.4 % (ref 0.0–2.0)
Eosinophils Absolute: 0 10*3/uL (ref 0.0–0.5)
LYMPH%: 44 % (ref 14.0–48.0)
MCHC: 34.5 g/dL (ref 32.0–36.0)
MCV: 77.6 fL — ABNORMAL LOW (ref 81.0–101.0)
MONO#: 0.1 10*3/uL (ref 0.1–0.9)
MONO%: 10.5 % (ref 0.0–13.0)
NEUT#: 0.5 10*3/uL — ABNORMAL LOW (ref 1.5–6.5)
Platelets: 204 10*3/uL (ref 145–400)
RBC: 3.41 10*6/uL — ABNORMAL LOW (ref 3.70–5.32)
RDW: 14.1 % (ref 11.3–14.5)
WBC: 1.3 10*3/uL — ABNORMAL LOW (ref 3.9–10.0)

## 2008-02-08 LAB — COMPREHENSIVE METABOLIC PANEL
ALT: 12 U/L (ref 0–35)
AST: 23 U/L (ref 0–37)
CO2: 27 mEq/L (ref 19–32)
Creatinine, Ser: 1.29 mg/dL — ABNORMAL HIGH (ref 0.40–1.20)
Sodium: 137 mEq/L (ref 135–145)
Total Bilirubin: 0.5 mg/dL (ref 0.3–1.2)
Total Protein: 4.6 g/dL — ABNORMAL LOW (ref 6.0–8.3)

## 2008-02-08 LAB — TECHNOLOGIST REVIEW

## 2008-02-14 LAB — CBC WITH DIFFERENTIAL/PLATELET
BASO%: 0.5 % (ref 0.0–2.0)
EOS%: 1.9 % (ref 0.0–7.0)
HCT: 26 % — ABNORMAL LOW (ref 34.8–46.6)
LYMPH%: 11.5 % — ABNORMAL LOW (ref 14.0–48.0)
MCH: 28.2 pg (ref 26.0–34.0)
MCHC: 35.1 g/dL (ref 32.0–36.0)
MCV: 80.5 fL — ABNORMAL LOW (ref 81.0–101.0)
MONO%: 8.7 % (ref 0.0–13.0)
NEUT%: 77.4 % — ABNORMAL HIGH (ref 39.6–76.8)
Platelets: 194 10*3/uL (ref 145–400)
RBC: 3.23 10*6/uL — ABNORMAL LOW (ref 3.70–5.32)

## 2008-02-14 LAB — COMPREHENSIVE METABOLIC PANEL
ALT: 13 U/L (ref 0–35)
AST: 22 U/L (ref 0–37)
Albumin: 2.5 g/dL — ABNORMAL LOW (ref 3.5–5.2)
Alkaline Phosphatase: 83 U/L (ref 39–117)
Potassium: 4.5 mEq/L (ref 3.5–5.3)
Sodium: 138 mEq/L (ref 135–145)
Total Bilirubin: 0.4 mg/dL (ref 0.3–1.2)
Total Protein: 4.9 g/dL — ABNORMAL LOW (ref 6.0–8.3)

## 2008-02-21 ENCOUNTER — Ambulatory Visit (HOSPITAL_COMMUNITY): Admission: RE | Admit: 2008-02-21 | Discharge: 2008-02-21 | Payer: Self-pay | Admitting: Oncology

## 2008-02-23 ENCOUNTER — Ambulatory Visit (HOSPITAL_COMMUNITY): Admission: RE | Admit: 2008-02-23 | Discharge: 2008-02-23 | Payer: Self-pay | Admitting: Oncology

## 2008-02-23 ENCOUNTER — Ambulatory Visit: Payer: Self-pay | Admitting: Internal Medicine

## 2008-02-23 LAB — CBC WITH DIFFERENTIAL/PLATELET
BASO%: 3.2 % — ABNORMAL HIGH (ref 0.0–2.0)
EOS%: 2 % (ref 0.0–7.0)
HCT: 25.4 % — ABNORMAL LOW (ref 34.8–46.6)
LYMPH%: 17.6 % (ref 14.0–48.0)
MCH: 28.7 pg (ref 26.0–34.0)
MCHC: 34.5 g/dL (ref 32.0–36.0)
MCV: 83.2 fL (ref 81.0–101.0)
MONO#: 0.3 10*3/uL (ref 0.1–0.9)
MONO%: 12.4 % (ref 0.0–13.0)
NEUT%: 64.8 % (ref 39.6–76.8)
Platelets: 208 10*3/uL (ref 145–400)

## 2008-02-27 ENCOUNTER — Ambulatory Visit: Payer: Self-pay | Admitting: Internal Medicine

## 2008-02-27 LAB — CONVERTED CEMR LAB
Calcium: 9.1 mg/dL (ref 8.4–10.5)
Creatinine, Ser: 1.5 mg/dL — ABNORMAL HIGH (ref 0.4–1.2)
GFR calc non Af Amer: 36 mL/min

## 2008-03-05 ENCOUNTER — Ambulatory Visit: Payer: Self-pay | Admitting: Oncology

## 2008-03-08 ENCOUNTER — Ambulatory Visit: Payer: Self-pay | Admitting: Internal Medicine

## 2008-03-08 LAB — CONVERTED CEMR LAB
Bilirubin, Direct: 0.1 mg/dL (ref 0.0–0.3)
Calcium: 8.3 mg/dL — ABNORMAL LOW (ref 8.4–10.5)
GFR calc Af Amer: 43 mL/min
Glucose, Bld: 141 mg/dL — ABNORMAL HIGH (ref 70–99)
Sodium: 136 meq/L (ref 135–145)
Total Bilirubin: 0.7 mg/dL (ref 0.3–1.2)
Total Protein: 5.7 g/dL — ABNORMAL LOW (ref 6.0–8.3)

## 2008-03-19 LAB — CBC WITH DIFFERENTIAL/PLATELET
Eosinophils Absolute: 0.1 10*3/uL (ref 0.0–0.5)
LYMPH%: 13.2 % — ABNORMAL LOW (ref 14.0–48.0)
MCH: 29.7 pg (ref 26.0–34.0)
MCV: 83.1 fL (ref 81.0–101.0)
MONO%: 26.1 % — ABNORMAL HIGH (ref 0.0–13.0)
NEUT#: 0.5 10*3/uL — ABNORMAL LOW (ref 1.5–6.5)
Platelets: 221 10*3/uL (ref 145–400)
RBC: 3.56 10*6/uL — ABNORMAL LOW (ref 3.70–5.32)

## 2008-03-19 LAB — COMPREHENSIVE METABOLIC PANEL
Albumin: 3 g/dL — ABNORMAL LOW (ref 3.5–5.2)
Alkaline Phosphatase: 85 U/L (ref 39–117)
BUN: 24 mg/dL — ABNORMAL HIGH (ref 6–23)
Calcium: 9.4 mg/dL (ref 8.4–10.5)
Chloride: 97 mEq/L (ref 96–112)
Glucose, Bld: 120 mg/dL — ABNORMAL HIGH (ref 70–99)
Potassium: 4 mEq/L (ref 3.5–5.3)

## 2008-03-19 LAB — HOLD TUBE, BLOOD BANK

## 2008-04-03 ENCOUNTER — Ambulatory Visit (HOSPITAL_COMMUNITY): Admission: RE | Admit: 2008-04-03 | Discharge: 2008-04-03 | Payer: Self-pay | Admitting: Oncology

## 2008-04-03 LAB — CBC WITH DIFFERENTIAL/PLATELET
BASO%: 1.2 % (ref 0.0–2.0)
Eosinophils Absolute: 0.2 10*3/uL (ref 0.0–0.5)
HCT: 29.8 % — ABNORMAL LOW (ref 34.8–46.6)
LYMPH%: 7.2 % — ABNORMAL LOW (ref 14.0–48.0)
MCHC: 34.4 g/dL (ref 32.0–36.0)
MCV: 86.2 fL (ref 81.0–101.0)
MONO#: 0.3 10*3/uL (ref 0.1–0.9)
MONO%: 5.3 % (ref 0.0–13.0)
NEUT%: 83.1 % — ABNORMAL HIGH (ref 39.6–76.8)
Platelets: 184 10*3/uL (ref 145–400)
WBC: 4.9 10*3/uL (ref 3.9–10.0)

## 2008-04-03 LAB — TSH: TSH: 3.267 u[IU]/mL (ref 0.350–4.500)

## 2008-04-03 LAB — COMPREHENSIVE METABOLIC PANEL
Alkaline Phosphatase: 94 U/L (ref 39–117)
CO2: 27 mEq/L (ref 19–32)
Creatinine, Ser: 0.96 mg/dL (ref 0.40–1.20)
Glucose, Bld: 90 mg/dL (ref 70–99)
Total Bilirubin: 0.4 mg/dL (ref 0.3–1.2)

## 2008-04-03 LAB — LACTATE DEHYDROGENASE: LDH: 311 U/L — ABNORMAL HIGH (ref 94–250)

## 2008-04-20 ENCOUNTER — Ambulatory Visit: Payer: Self-pay | Admitting: Oncology

## 2008-04-20 LAB — CBC WITH DIFFERENTIAL/PLATELET
Basophils Absolute: 0.1 10*3/uL (ref 0.0–0.1)
Eosinophils Absolute: 0.1 10*3/uL (ref 0.0–0.5)
HGB: 10.2 g/dL — ABNORMAL LOW (ref 11.6–15.9)
MONO#: 0.3 10*3/uL (ref 0.1–0.9)
NEUT#: 1.6 10*3/uL (ref 1.5–6.5)
RDW: 13.7 % (ref 11.3–14.5)
lymph#: 0.4 10*3/uL — ABNORMAL LOW (ref 0.9–3.3)

## 2008-04-20 LAB — URINALYSIS, MICROSCOPIC - CHCC
Bilirubin (Urine): NEGATIVE
Epithelial Cells: NONE SEEN
Ketones: NEGATIVE mg/dL
Specific Gravity, Urine: 1.01 (ref 1.003–1.035)
pH: 6.5 (ref 4.6–8.0)

## 2008-04-25 ENCOUNTER — Ambulatory Visit (HOSPITAL_COMMUNITY): Admission: RE | Admit: 2008-04-25 | Discharge: 2008-04-25 | Payer: Self-pay | Admitting: Oncology

## 2008-05-09 LAB — COMPREHENSIVE METABOLIC PANEL
ALT: 29 U/L (ref 0–35)
Albumin: 3.6 g/dL (ref 3.5–5.2)
Alkaline Phosphatase: 89 U/L (ref 39–117)
CO2: 27 mEq/L (ref 19–32)
Glucose, Bld: 121 mg/dL — ABNORMAL HIGH (ref 70–99)
Potassium: 4.1 mEq/L (ref 3.5–5.3)
Sodium: 142 mEq/L (ref 135–145)
Total Bilirubin: 0.6 mg/dL (ref 0.3–1.2)
Total Protein: 5.9 g/dL — ABNORMAL LOW (ref 6.0–8.3)

## 2008-05-09 LAB — CBC WITH DIFFERENTIAL/PLATELET
Basophils Absolute: 0.1 10*3/uL (ref 0.0–0.1)
Eosinophils Absolute: 0.2 10*3/uL (ref 0.0–0.5)
HGB: 11.7 g/dL (ref 11.6–15.9)
NEUT#: 2.6 10*3/uL (ref 1.5–6.5)
RBC: 3.96 10*6/uL (ref 3.70–5.32)
RDW: 13.7 % (ref 11.3–14.5)
WBC: 3.6 10*3/uL — ABNORMAL LOW (ref 3.9–10.0)
lymph#: 0.5 10*3/uL — ABNORMAL LOW (ref 0.9–3.3)

## 2008-05-09 LAB — URINALYSIS, MICROSCOPIC - CHCC
Bilirubin (Urine): NEGATIVE
Glucose: NEGATIVE g/dL
Ketones: NEGATIVE mg/dL
RBC count: NEGATIVE (ref 0–2)
pH: 6 (ref 4.6–8.0)

## 2008-05-09 LAB — LACTATE DEHYDROGENASE: LDH: 202 U/L (ref 94–250)

## 2008-05-17 ENCOUNTER — Encounter: Admission: RE | Admit: 2008-05-17 | Discharge: 2008-05-17 | Payer: Self-pay | Admitting: Family Medicine

## 2008-06-01 LAB — CBC WITH DIFFERENTIAL/PLATELET
EOS%: 1.6 % (ref 0.0–7.0)
HGB: 11 g/dL — ABNORMAL LOW (ref 11.6–15.9)
MCH: 29.3 pg (ref 26.0–34.0)
MCV: 82.9 fL (ref 81.0–101.0)
MONO%: 8.3 % (ref 0.0–13.0)
NEUT#: 3.7 10*3/uL (ref 1.5–6.5)
RBC: 3.76 10*6/uL (ref 3.70–5.32)
RDW: 12.6 % (ref 11.3–14.5)
lymph#: 0.3 10*3/uL — ABNORMAL LOW (ref 0.9–3.3)

## 2008-06-05 ENCOUNTER — Ambulatory Visit: Payer: Self-pay | Admitting: Oncology

## 2008-06-05 LAB — URINALYSIS, MICROSCOPIC - CHCC
Bilirubin (Urine): NEGATIVE
Blood: NEGATIVE
Glucose: NEGATIVE g/dL
Ketones: NEGATIVE mg/dL
Leukocyte Esterase: NEGATIVE
Specific Gravity, Urine: 1.015 (ref 1.003–1.035)
pH: 6 (ref 4.6–8.0)

## 2008-06-05 LAB — COMPREHENSIVE METABOLIC PANEL
AST: 33 U/L (ref 0–37)
Alkaline Phosphatase: 227 U/L — ABNORMAL HIGH (ref 39–117)
Glucose, Bld: 151 mg/dL — ABNORMAL HIGH (ref 70–99)
Sodium: 140 mEq/L (ref 135–145)
Total Bilirubin: 0.6 mg/dL (ref 0.3–1.2)
Total Protein: 6.4 g/dL (ref 6.0–8.3)

## 2008-06-05 LAB — CBC WITH DIFFERENTIAL/PLATELET
Basophils Absolute: 0.1 10*3/uL (ref 0.0–0.1)
Eosinophils Absolute: 0.1 10*3/uL (ref 0.0–0.5)
HGB: 10.9 g/dL — ABNORMAL LOW (ref 11.6–15.9)
MONO#: 0.4 10*3/uL (ref 0.1–0.9)
NEUT#: 3.4 10*3/uL (ref 1.5–6.5)
RBC: 3.76 10*6/uL (ref 3.70–5.32)
RDW: 12.4 % (ref 11.3–14.5)
WBC: 4.4 10*3/uL (ref 3.9–10.0)
lymph#: 0.3 10*3/uL — ABNORMAL LOW (ref 0.9–3.3)

## 2008-06-05 LAB — LACTATE DEHYDROGENASE: LDH: 216 U/L (ref 94–250)

## 2008-06-11 ENCOUNTER — Ambulatory Visit (HOSPITAL_COMMUNITY): Admission: RE | Admit: 2008-06-11 | Discharge: 2008-06-11 | Payer: Self-pay | Admitting: Oncology

## 2008-06-19 LAB — CBC WITH DIFFERENTIAL/PLATELET
BASO%: 1.8 % (ref 0.0–2.0)
EOS%: 1.1 % (ref 0.0–7.0)
HGB: 10.5 g/dL — ABNORMAL LOW (ref 11.6–15.9)
MCH: 28.4 pg (ref 26.0–34.0)
MCHC: 34.9 g/dL (ref 32.0–36.0)
RBC: 3.69 10*6/uL — ABNORMAL LOW (ref 3.70–5.32)
RDW: 12.3 % (ref 11.3–14.5)
lymph#: 0.3 10*3/uL — ABNORMAL LOW (ref 0.9–3.3)

## 2008-06-26 LAB — CBC WITH DIFFERENTIAL/PLATELET
BASO%: 3.7 % — ABNORMAL HIGH (ref 0.0–2.0)
Basophils Absolute: 0.1 10*3/uL (ref 0.0–0.1)
EOS%: 1.5 % (ref 0.0–7.0)
HGB: 10 g/dL — ABNORMAL LOW (ref 11.6–15.9)
MCH: 28 pg (ref 26.0–34.0)
MCHC: 34.2 g/dL (ref 32.0–36.0)
MCV: 81.9 fL (ref 81.0–101.0)
MONO%: 9.9 % (ref 0.0–13.0)
NEUT%: 69.1 % (ref 39.6–76.8)
RDW: 13.5 % (ref 11.3–14.5)
lymph#: 0.6 10*3/uL — ABNORMAL LOW (ref 0.9–3.3)

## 2008-07-30 ENCOUNTER — Ambulatory Visit: Payer: Self-pay | Admitting: Internal Medicine

## 2008-07-31 ENCOUNTER — Ambulatory Visit: Payer: Self-pay | Admitting: Oncology

## 2008-08-01 ENCOUNTER — Ambulatory Visit (HOSPITAL_COMMUNITY): Admission: RE | Admit: 2008-08-01 | Discharge: 2008-08-01 | Payer: Self-pay | Admitting: Oncology

## 2008-10-11 ENCOUNTER — Ambulatory Visit: Payer: Self-pay | Admitting: Oncology

## 2008-10-15 LAB — COMPREHENSIVE METABOLIC PANEL
ALT: 21 U/L (ref 0–35)
AST: 22 U/L (ref 0–37)
Albumin: 4 g/dL (ref 3.5–5.2)
CO2: 29 mEq/L (ref 19–32)
Calcium: 9.6 mg/dL (ref 8.4–10.5)
Chloride: 105 mEq/L (ref 96–112)
Potassium: 4 mEq/L (ref 3.5–5.3)
Sodium: 143 mEq/L (ref 135–145)
Total Protein: 6.2 g/dL (ref 6.0–8.3)

## 2008-10-15 LAB — CBC WITH DIFFERENTIAL/PLATELET
Basophils Absolute: 0 10*3/uL (ref 0.0–0.1)
Eosinophils Absolute: 0.1 10*3/uL (ref 0.0–0.5)
HGB: 11.6 g/dL (ref 11.6–15.9)
MCV: 83.6 fL (ref 79.5–101.0)
MONO#: 0.3 10*3/uL (ref 0.1–0.9)
MONO%: 8.1 % (ref 0.0–14.0)
NEUT#: 2.3 10*3/uL (ref 1.5–6.5)
RBC: 4.03 10*6/uL (ref 3.70–5.45)
RDW: 12.7 % (ref 11.2–14.5)
WBC: 3.2 10*3/uL — ABNORMAL LOW (ref 3.9–10.3)
lymph#: 0.5 10*3/uL — ABNORMAL LOW (ref 0.9–3.3)
nRBC: 0 % (ref 0–0)

## 2008-10-15 LAB — LACTATE DEHYDROGENASE: LDH: 220 U/L (ref 94–250)

## 2008-10-15 LAB — TECHNOLOGIST REVIEW: Technologist Review: 2

## 2008-10-25 ENCOUNTER — Encounter: Admission: RE | Admit: 2008-10-25 | Discharge: 2008-10-25 | Payer: Self-pay | Admitting: Orthopaedic Surgery

## 2008-11-26 ENCOUNTER — Ambulatory Visit: Payer: Self-pay | Admitting: Oncology

## 2008-12-04 ENCOUNTER — Encounter: Admission: RE | Admit: 2008-12-04 | Discharge: 2008-12-04 | Payer: Self-pay | Admitting: Neurology

## 2008-12-09 IMAGING — US US RENAL
1 series · 14 of 25 positions shown · non-contrast
Comparison: CT 01/23/2008

CLINICAL DATA: Lymphoma.  Hydronephrosis. Hypertension

RENAL/URINARY TRACT ULTRASOUND
TECHNIQUE: Complete ultrasound examination of the urinary tract
was performed including evaluation of the kidneys renal collecting
systems and urinary bladder.

[Series 1: unknown · 0.28mm/px · 14 of 31 slices shown]
[im 1/31]
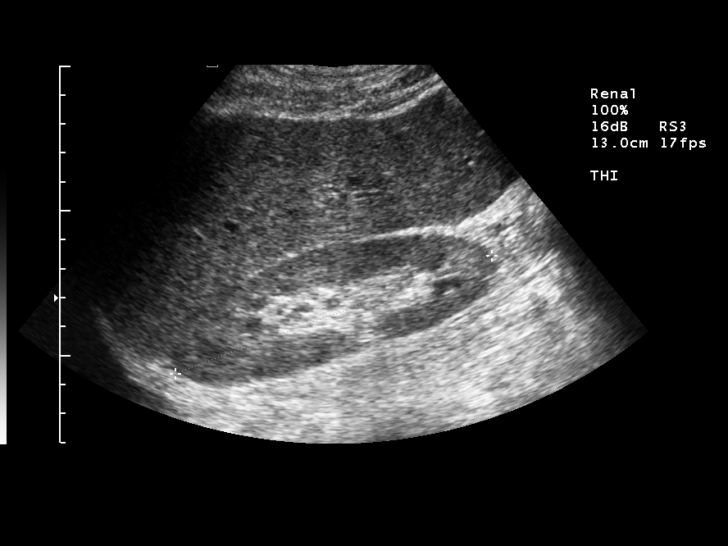
[im 3/31]
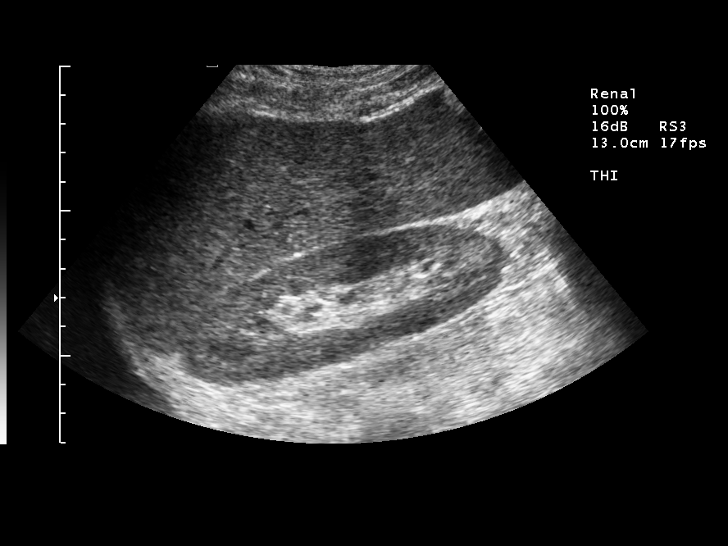
[im 6/31]
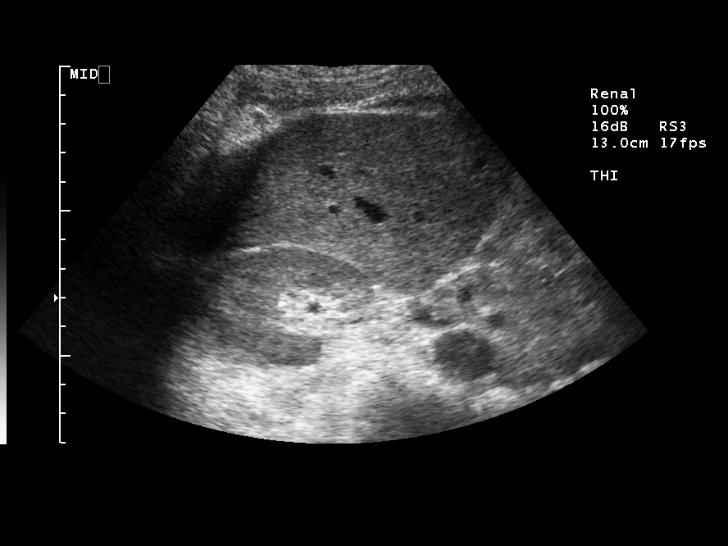
[im 8/31]
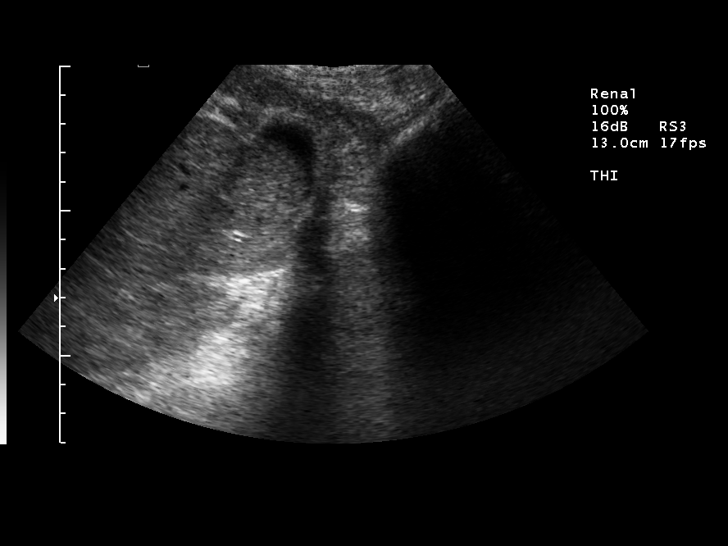
[im 11/31]
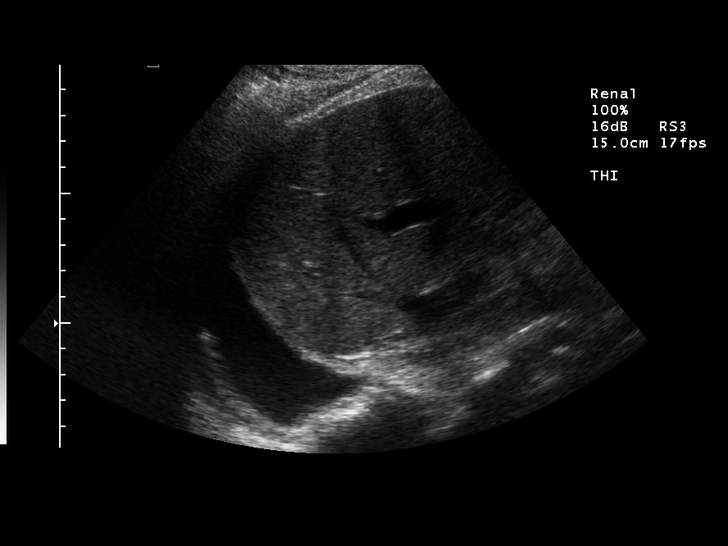
[im 12/31]
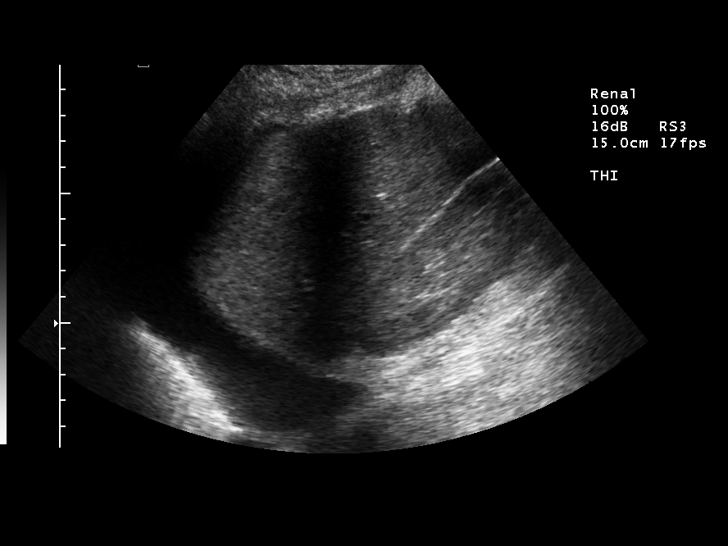
[im 14/31]
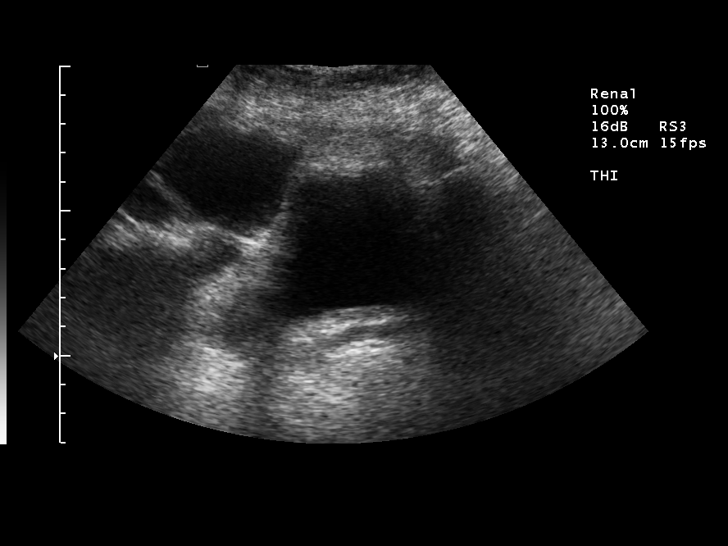
[im 17/31]
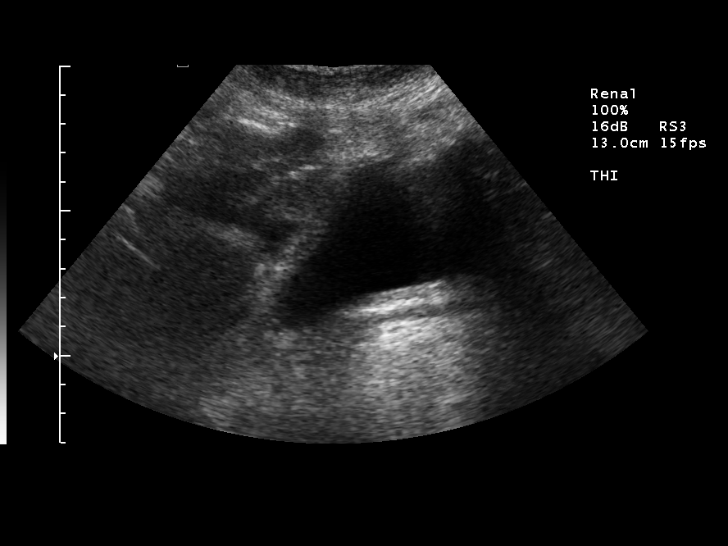
[im 19/31]
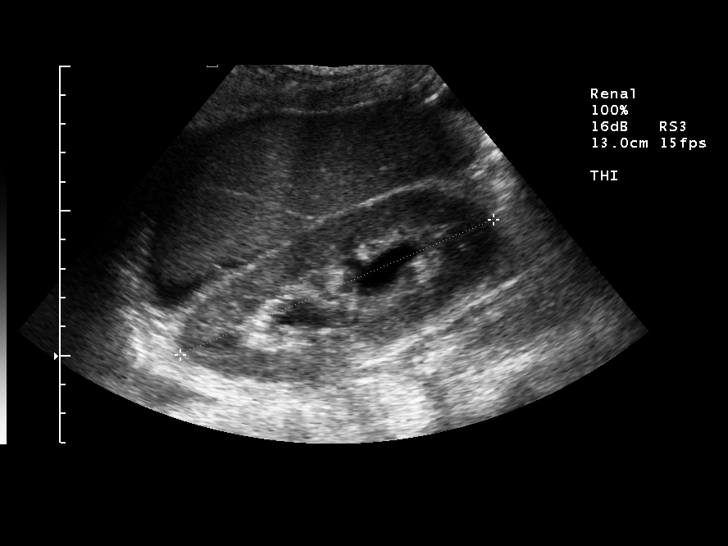
[im 21/31]
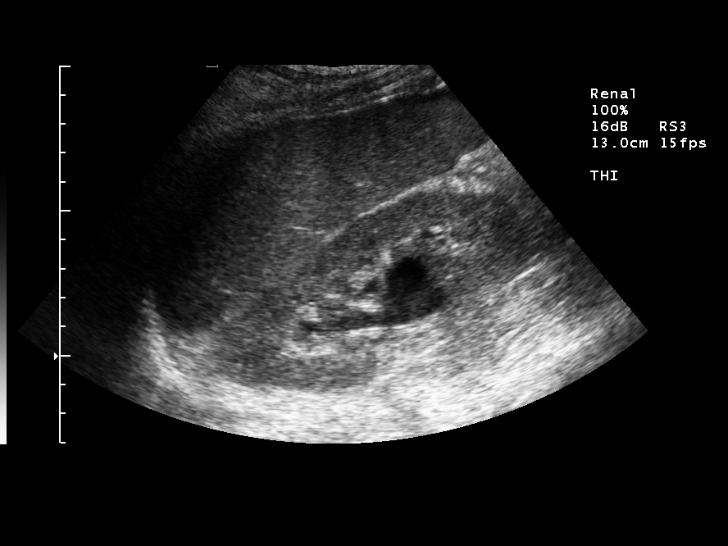
[im 23/31]
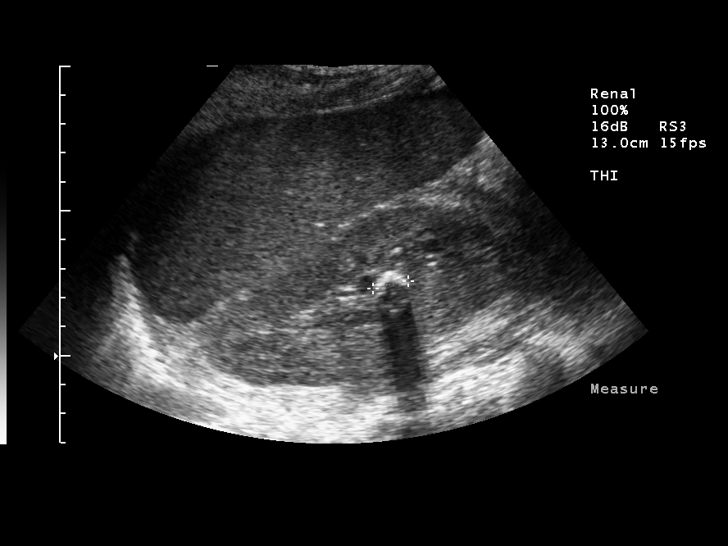
[im 26/31]
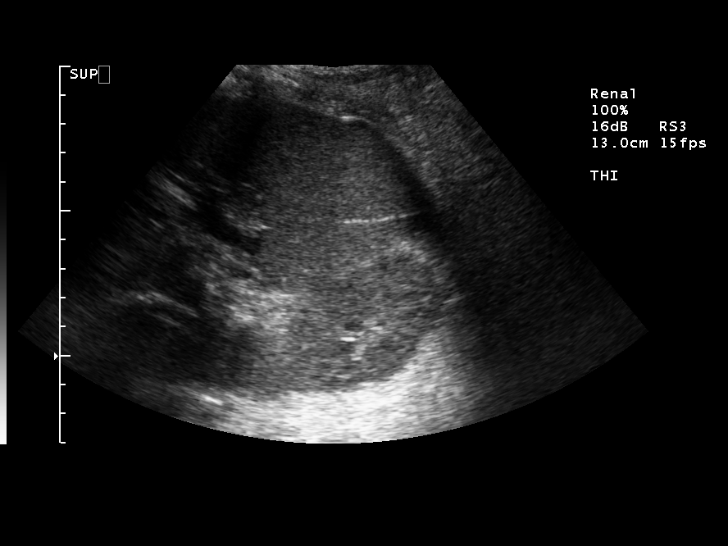
[im 28/31]
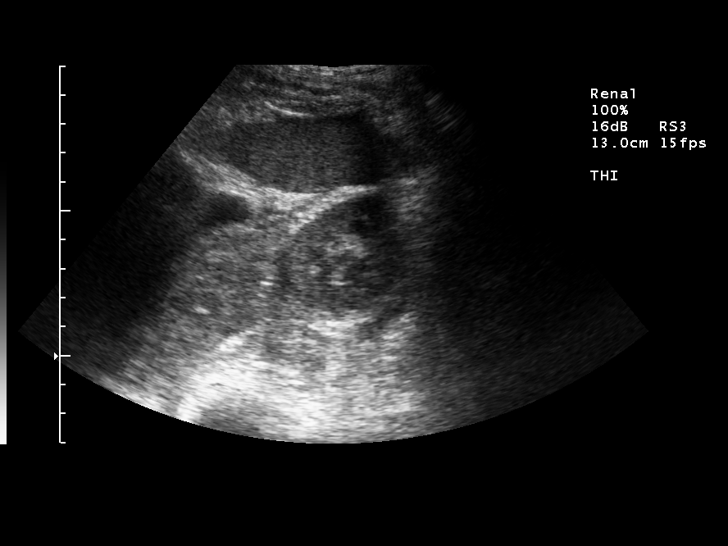
[im 31/31]
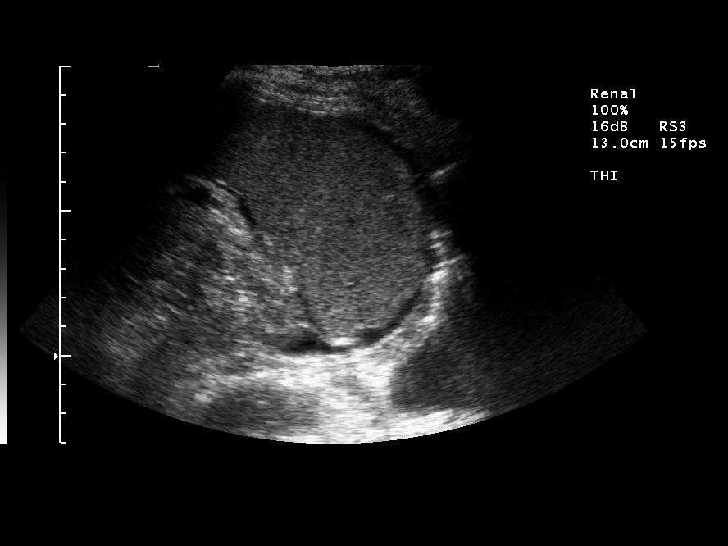

[14 of 25 positions shown; findings below may reference images not displayed]

FINDINGS: The right and left kidneys measure 11.6 and 11.8 cm in
length, respectively.  Moderate left hydronephrosis.  1.2 cm
curvilinear calcification in the left kidney corresponding with the
calcification noted on CT. Normal renal parenchymal
echotexture/echogenicity.  Pleural effusions and ascites.
IMPRESSION: Moderate left hydronephrosis.

## 2008-12-21 ENCOUNTER — Encounter (INDEPENDENT_AMBULATORY_CARE_PROVIDER_SITE_OTHER): Payer: Self-pay | Admitting: *Deleted

## 2008-12-31 ENCOUNTER — Encounter: Payer: Self-pay | Admitting: Internal Medicine

## 2008-12-31 LAB — CBC WITH DIFFERENTIAL/PLATELET
Basophils Absolute: 0 10*3/uL (ref 0.0–0.1)
Eosinophils Absolute: 0.1 10*3/uL (ref 0.0–0.5)
HGB: 11.9 g/dL (ref 11.6–15.9)
LYMPH%: 19.2 % (ref 14.0–49.7)
MCV: 86.7 fL (ref 79.5–101.0)
MONO#: 0.3 10*3/uL (ref 0.1–0.9)
MONO%: 6.5 % (ref 0.0–14.0)
NEUT#: 3 10*3/uL (ref 1.5–6.5)
Platelets: 126 10*3/uL — ABNORMAL LOW (ref 145–400)
RDW: 12.4 % (ref 11.2–14.5)

## 2008-12-31 LAB — COMPREHENSIVE METABOLIC PANEL
Albumin: 4 g/dL (ref 3.5–5.2)
Alkaline Phosphatase: 101 U/L (ref 39–117)
BUN: 15 mg/dL (ref 6–23)
CO2: 26 mEq/L (ref 19–32)
Glucose, Bld: 81 mg/dL (ref 70–99)
Potassium: 4 mEq/L (ref 3.5–5.3)

## 2009-01-01 ENCOUNTER — Encounter: Payer: Self-pay | Admitting: Internal Medicine

## 2009-01-01 ENCOUNTER — Ambulatory Visit: Payer: Self-pay | Admitting: Internal Medicine

## 2009-01-01 DIAGNOSIS — R609 Edema, unspecified: Secondary | ICD-10-CM

## 2009-01-01 DIAGNOSIS — I10 Essential (primary) hypertension: Secondary | ICD-10-CM | POA: Insufficient documentation

## 2009-02-22 ENCOUNTER — Ambulatory Visit (HOSPITAL_COMMUNITY): Admission: RE | Admit: 2009-02-22 | Discharge: 2009-02-22 | Payer: Self-pay | Admitting: Otolaryngology

## 2009-04-03 ENCOUNTER — Ambulatory Visit: Payer: Self-pay | Admitting: Oncology

## 2009-04-05 ENCOUNTER — Encounter: Payer: Self-pay | Admitting: Internal Medicine

## 2009-04-05 LAB — COMPREHENSIVE METABOLIC PANEL WITH GFR
ALT: 29 U/L (ref 0–35)
AST: 28 U/L (ref 0–37)
Albumin: 3.9 g/dL (ref 3.5–5.2)
Alkaline Phosphatase: 108 U/L (ref 39–117)
BUN: 19 mg/dL (ref 6–23)
CO2: 25 meq/L (ref 19–32)
Calcium: 8.8 mg/dL (ref 8.4–10.5)
Chloride: 106 meq/L (ref 96–112)
Creatinine, Ser: 1 mg/dL (ref 0.40–1.20)
Glucose, Bld: 101 mg/dL — ABNORMAL HIGH (ref 70–99)
Potassium: 4.2 meq/L (ref 3.5–5.3)
Sodium: 142 meq/L (ref 135–145)
Total Bilirubin: 0.4 mg/dL (ref 0.3–1.2)
Total Protein: 6.3 g/dL (ref 6.0–8.3)

## 2009-04-05 LAB — CBC WITH DIFFERENTIAL/PLATELET
BASO%: 0.9 % (ref 0.0–2.0)
Basophils Absolute: 0 10*3/uL (ref 0.0–0.1)
EOS%: 2.9 % (ref 0.0–7.0)
Eosinophils Absolute: 0.1 10*3/uL (ref 0.0–0.5)
HCT: 34 % — ABNORMAL LOW (ref 34.8–46.6)
HGB: 11.7 g/dL (ref 11.6–15.9)
LYMPH%: 21.2 % (ref 14.0–49.7)
MCH: 29.3 pg (ref 25.1–34.0)
MCHC: 34.4 g/dL (ref 31.5–36.0)
MCV: 85 fL (ref 79.5–101.0)
MONO#: 0.3 10*3/uL (ref 0.1–0.9)
MONO%: 8.7 % (ref 0.0–14.0)
NEUT#: 2.3 10*3/uL (ref 1.5–6.5)
NEUT%: 66.3 % (ref 38.4–76.8)
Platelets: 122 10*3/uL — ABNORMAL LOW (ref 145–400)
RBC: 4 10*6/uL (ref 3.70–5.45)
RDW: 12.6 % (ref 11.2–14.5)
WBC: 3.5 10*3/uL — ABNORMAL LOW (ref 3.9–10.3)
lymph#: 0.7 10*3/uL — ABNORMAL LOW (ref 0.9–3.3)
nRBC: 0 % (ref 0–0)

## 2009-04-05 LAB — LACTATE DEHYDROGENASE: LDH: 150 U/L (ref 94–250)

## 2009-06-07 ENCOUNTER — Encounter: Admission: RE | Admit: 2009-06-07 | Discharge: 2009-06-07 | Payer: Self-pay | Admitting: Family Medicine

## 2009-06-26 ENCOUNTER — Ambulatory Visit: Payer: Self-pay | Admitting: Oncology

## 2009-06-28 ENCOUNTER — Encounter: Payer: Self-pay | Admitting: Internal Medicine

## 2009-06-28 LAB — CBC WITH DIFFERENTIAL/PLATELET
BASO%: 1.5 % (ref 0.0–2.0)
LYMPH%: 15.2 % (ref 14.0–49.7)
MCHC: 33.5 g/dL (ref 31.5–36.0)
MONO#: 0.3 10*3/uL (ref 0.1–0.9)
Platelets: 122 10*3/uL — ABNORMAL LOW (ref 145–400)
RBC: 4.05 10*6/uL (ref 3.70–5.45)
RDW: 12.9 % (ref 11.2–14.5)
WBC: 3.3 10*3/uL — ABNORMAL LOW (ref 3.9–10.3)
lymph#: 0.5 10*3/uL — ABNORMAL LOW (ref 0.9–3.3)

## 2009-06-28 LAB — COMPREHENSIVE METABOLIC PANEL
ALT: 26 U/L (ref 0–35)
CO2: 29 mEq/L (ref 19–32)
Sodium: 141 mEq/L (ref 135–145)
Total Bilirubin: 0.4 mg/dL (ref 0.3–1.2)
Total Protein: 6.2 g/dL (ref 6.0–8.3)

## 2009-06-28 LAB — LACTATE DEHYDROGENASE: LDH: 156 U/L (ref 94–250)

## 2009-08-29 ENCOUNTER — Ambulatory Visit: Payer: Self-pay | Admitting: Oncology

## 2009-09-02 ENCOUNTER — Encounter: Payer: Self-pay | Admitting: Internal Medicine

## 2009-09-02 LAB — COMPREHENSIVE METABOLIC PANEL
ALT: 22 U/L (ref 0–35)
Alkaline Phosphatase: 101 U/L (ref 39–117)
CO2: 28 mEq/L (ref 19–32)
Sodium: 141 mEq/L (ref 135–145)
Total Bilirubin: 0.4 mg/dL (ref 0.3–1.2)
Total Protein: 6.1 g/dL (ref 6.0–8.3)

## 2009-09-02 LAB — CBC WITH DIFFERENTIAL/PLATELET
Eosinophils Absolute: 0.1 10*3/uL (ref 0.0–0.5)
MCV: 87 fL (ref 79.5–101.0)
MONO%: 9.5 % (ref 0.0–14.0)
NEUT#: 2.5 10*3/uL (ref 1.5–6.5)
RBC: 3.92 10*6/uL (ref 3.70–5.45)
RDW: 13.1 % (ref 11.2–14.5)
WBC: 3.8 10*3/uL — ABNORMAL LOW (ref 3.9–10.3)
lymph#: 0.8 10*3/uL — ABNORMAL LOW (ref 0.9–3.3)
nRBC: 0 % (ref 0–0)

## 2009-09-02 LAB — LACTATE DEHYDROGENASE: LDH: 141 U/L (ref 94–250)

## 2009-11-11 ENCOUNTER — Encounter: Payer: Self-pay | Admitting: Internal Medicine

## 2009-11-11 ENCOUNTER — Ambulatory Visit: Payer: Self-pay | Admitting: Oncology

## 2009-11-11 LAB — CBC WITH DIFFERENTIAL/PLATELET
BASO%: 0.8 % (ref 0.0–2.0)
EOS%: 2.1 % (ref 0.0–7.0)
HGB: 11.6 g/dL (ref 11.6–15.9)
MCH: 29.1 pg (ref 25.1–34.0)
MCHC: 34.2 g/dL (ref 31.5–36.0)
RBC: 3.99 10*6/uL (ref 3.70–5.45)
RDW: 12.7 % (ref 11.2–14.5)
lymph#: 1 10*3/uL (ref 0.9–3.3)
nRBC: 0 % (ref 0–0)

## 2009-11-11 LAB — COMPREHENSIVE METABOLIC PANEL
AST: 26 U/L (ref 0–37)
Albumin: 3.8 g/dL (ref 3.5–5.2)
Alkaline Phosphatase: 84 U/L (ref 39–117)
Potassium: 4.2 mEq/L (ref 3.5–5.3)
Sodium: 141 mEq/L (ref 135–145)
Total Bilirubin: 0.7 mg/dL (ref 0.3–1.2)
Total Protein: 6 g/dL (ref 6.0–8.3)

## 2009-11-11 LAB — LACTATE DEHYDROGENASE: LDH: 138 U/L (ref 94–250)

## 2009-11-12 LAB — TSH: TSH: 2.141 u[IU]/mL (ref 0.350–4.500)

## 2009-12-03 ENCOUNTER — Encounter: Payer: Self-pay | Admitting: Internal Medicine

## 2010-04-22 ENCOUNTER — Ambulatory Visit: Payer: Self-pay | Admitting: Oncology

## 2010-04-24 LAB — CBC WITH DIFFERENTIAL/PLATELET
Eosinophils Absolute: 0.1 10*3/uL (ref 0.0–0.5)
HCT: 33.6 % — ABNORMAL LOW (ref 34.8–46.6)
LYMPH%: 29.8 % (ref 14.0–49.7)
MONO#: 0.3 10*3/uL (ref 0.1–0.9)
NEUT#: 2.6 10*3/uL (ref 1.5–6.5)
NEUT%: 60.5 % (ref 38.4–76.8)
Platelets: 126 10*3/uL — ABNORMAL LOW (ref 145–400)
RBC: 3.93 10*6/uL (ref 3.70–5.45)
WBC: 4.2 10*3/uL (ref 3.9–10.3)
nRBC: 0 % (ref 0–0)

## 2010-04-24 LAB — COMPREHENSIVE METABOLIC PANEL
ALT: 18 U/L (ref 0–35)
Albumin: 3.8 g/dL (ref 3.5–5.2)
CO2: 26 mEq/L (ref 19–32)
Calcium: 9.1 mg/dL (ref 8.4–10.5)
Chloride: 107 mEq/L (ref 96–112)
Creatinine, Ser: 0.83 mg/dL (ref 0.40–1.20)

## 2010-04-24 LAB — LACTATE DEHYDROGENASE: LDH: 160 U/L (ref 94–250)

## 2010-06-11 ENCOUNTER — Encounter: Admission: RE | Admit: 2010-06-11 | Discharge: 2010-06-11 | Payer: Self-pay | Admitting: Family Medicine

## 2010-06-30 ENCOUNTER — Encounter: Admission: RE | Admit: 2010-06-30 | Discharge: 2010-06-30 | Payer: Self-pay | Admitting: Family Medicine

## 2010-07-01 ENCOUNTER — Ambulatory Visit: Payer: Self-pay | Admitting: Oncology

## 2010-07-01 LAB — CBC WITH DIFFERENTIAL/PLATELET
BASO%: 0.6 % (ref 0.0–2.0)
EOS%: 1.6 % (ref 0.0–7.0)
HCT: 30.2 % — ABNORMAL LOW (ref 34.8–46.6)
LYMPH%: 13.9 % — ABNORMAL LOW (ref 14.0–49.7)
MCH: 27 pg (ref 25.1–34.0)
MCHC: 32.8 g/dL (ref 31.5–36.0)
NEUT%: 75.5 % (ref 38.4–76.8)
Platelets: 173 10*3/uL (ref 145–400)

## 2010-07-01 LAB — COMPREHENSIVE METABOLIC PANEL
Albumin: 3.8 g/dL (ref 3.5–5.2)
Alkaline Phosphatase: 164 U/L — ABNORMAL HIGH (ref 39–117)
BUN: 13 mg/dL (ref 6–23)
Glucose, Bld: 126 mg/dL — ABNORMAL HIGH (ref 70–99)
Total Bilirubin: 0.3 mg/dL (ref 0.3–1.2)

## 2010-07-10 ENCOUNTER — Encounter: Payer: Self-pay | Admitting: Internal Medicine

## 2010-07-10 LAB — CBC WITH DIFFERENTIAL/PLATELET
Eosinophils Absolute: 0.1 10*3/uL (ref 0.0–0.5)
HCT: 32.6 % — ABNORMAL LOW (ref 34.8–46.6)
LYMPH%: 22.6 % (ref 14.0–49.7)
MONO#: 0.4 10*3/uL (ref 0.1–0.9)
NEUT#: 3.3 10*3/uL (ref 1.5–6.5)
Platelets: 172 10*3/uL (ref 145–400)
RBC: 3.99 10*6/uL (ref 3.70–5.45)
WBC: 5 10*3/uL (ref 3.9–10.3)
lymph#: 1.1 10*3/uL (ref 0.9–3.3)
nRBC: 0 % (ref 0–0)

## 2010-07-10 LAB — IRON AND TIBC: Iron: 38 ug/dL — ABNORMAL LOW (ref 42–145)

## 2010-07-10 LAB — RETICULOCYTES
RBC: 4.12 10*6/uL (ref 3.70–5.45)
Retic %: 1.23 % (ref 0.50–1.50)
Retic Ct Abs: 50.68 10*3/uL (ref 18.30–72.70)

## 2010-07-10 LAB — TSH: TSH: 1.928 u[IU]/mL (ref 0.350–4.500)

## 2010-07-29 ENCOUNTER — Encounter
Admission: RE | Admit: 2010-07-29 | Discharge: 2010-07-29 | Payer: Self-pay | Source: Home / Self Care | Attending: Family Medicine | Admitting: Family Medicine

## 2010-07-31 ENCOUNTER — Encounter: Payer: Self-pay | Admitting: Internal Medicine

## 2010-07-31 ENCOUNTER — Ambulatory Visit: Payer: Self-pay | Admitting: Oncology

## 2010-07-31 LAB — CBC WITH DIFFERENTIAL/PLATELET
BASO%: 0.6 % (ref 0.0–2.0)
Basophils Absolute: 0 10*3/uL (ref 0.0–0.1)
EOS%: 2.3 % (ref 0.0–7.0)
Eosinophils Absolute: 0.1 10*3/uL (ref 0.0–0.5)
HCT: 33 % — ABNORMAL LOW (ref 34.8–46.6)
HGB: 10.6 g/dL — ABNORMAL LOW (ref 11.6–15.9)
LYMPH%: 26.4 % (ref 14.0–49.7)
MCH: 26 pg (ref 25.1–34.0)
MCHC: 32.1 g/dL (ref 31.5–36.0)
MCV: 81.1 fL (ref 79.5–101.0)
MONO#: 0.3 10*3/uL (ref 0.1–0.9)
MONO%: 7 % (ref 0.0–14.0)
NEUT#: 3 10*3/uL (ref 1.5–6.5)
NEUT%: 63.7 % (ref 38.4–76.8)
Platelets: 104 10*3/uL — ABNORMAL LOW (ref 145–400)
RBC: 4.07 10*6/uL (ref 3.70–5.45)
RDW: 14.2 % (ref 11.2–14.5)
WBC: 4.7 10*3/uL (ref 3.9–10.3)
lymph#: 1.2 10*3/uL (ref 0.9–3.3)
nRBC: 0 % (ref 0–0)

## 2010-08-01 ENCOUNTER — Encounter
Admission: RE | Admit: 2010-08-01 | Discharge: 2010-08-01 | Payer: Self-pay | Source: Home / Self Care | Attending: Family Medicine | Admitting: Family Medicine

## 2010-08-06 ENCOUNTER — Other Ambulatory Visit: Payer: Self-pay | Admitting: Diagnostic Radiology

## 2010-08-06 ENCOUNTER — Encounter
Admission: RE | Admit: 2010-08-06 | Discharge: 2010-08-06 | Payer: Self-pay | Source: Home / Self Care | Attending: Surgery | Admitting: Surgery

## 2010-08-13 ENCOUNTER — Encounter: Payer: Self-pay | Admitting: Internal Medicine

## 2010-08-17 ENCOUNTER — Encounter: Payer: Self-pay | Admitting: Family Medicine

## 2010-08-17 ENCOUNTER — Encounter: Payer: Self-pay | Admitting: Oncology

## 2010-08-18 ENCOUNTER — Encounter: Payer: Self-pay | Admitting: Family Medicine

## 2010-08-18 ENCOUNTER — Encounter: Payer: Self-pay | Admitting: Oncology

## 2010-08-20 ENCOUNTER — Other Ambulatory Visit (HOSPITAL_COMMUNITY): Payer: Self-pay | Admitting: Surgery

## 2010-08-20 DIAGNOSIS — C50919 Malignant neoplasm of unspecified site of unspecified female breast: Secondary | ICD-10-CM

## 2010-08-28 NOTE — Letter (Signed)
[facility]   Imported By: Marylou Mccoy 01/24/2010 11:27:56  _____________________________________________________________________  External Attachment:    Type:   Image     Comment:   External Document

## 2010-08-28 NOTE — Letter (Signed)
[facility]   Imported By: Marylou Mccoy 01/15/2010 17:52:26  _____________________________________________________________________  External Attachment:    Type:   Image     Comment:   External Document

## 2010-08-28 NOTE — Letter (Signed)
Summary: [facility]  [facility]   Imported By: Lennie Odor 07/24/2010 16:58:35  _____________________________________________________________________  External Attachment:    Type:   Image     Comment:   External Document

## 2010-08-28 NOTE — Letter (Signed)
[facility]   Imported By: Roderic Ovens 10/15/2009 15:59:26  _____________________________________________________________________  External Attachment:    Type:   Image     Comment:   External Document

## 2010-09-01 ENCOUNTER — Other Ambulatory Visit (HOSPITAL_COMMUNITY): Payer: Medicare Other

## 2010-09-01 ENCOUNTER — Other Ambulatory Visit (HOSPITAL_COMMUNITY): Payer: Self-pay | Admitting: Surgery

## 2010-09-01 ENCOUNTER — Ambulatory Visit (HOSPITAL_COMMUNITY)
Admission: RE | Admit: 2010-09-01 | Discharge: 2010-09-01 | Disposition: A | Discharge status: Home / Self Care | Payer: Medicare Other | Source: Ambulatory Visit | Attending: Surgery | Admitting: Surgery

## 2010-09-01 ENCOUNTER — Encounter (HOSPITAL_COMMUNITY)
Admission: RE | Admit: 2010-09-01 | Discharge: 2010-09-01 | Disposition: A | Discharge status: Home / Self Care | Payer: Medicare Other | Source: Ambulatory Visit | Attending: Surgery | Admitting: Surgery

## 2010-09-01 DIAGNOSIS — Z01811 Encounter for preprocedural respiratory examination: Secondary | ICD-10-CM

## 2010-09-01 DIAGNOSIS — Z01818 Encounter for other preprocedural examination: Secondary | ICD-10-CM | POA: Insufficient documentation

## 2010-09-01 DIAGNOSIS — C50919 Malignant neoplasm of unspecified site of unspecified female breast: Secondary | ICD-10-CM | POA: Insufficient documentation

## 2010-09-01 LAB — COMPREHENSIVE METABOLIC PANEL
ALT: 19 U/L (ref 0–35)
AST: 25 U/L (ref 0–37)
Alkaline Phosphatase: 93 U/L (ref 39–117)
CO2: 28 mEq/L (ref 19–32)
Chloride: 105 mEq/L (ref 96–112)
Creatinine, Ser: 0.99 mg/dL (ref 0.4–1.2)
GFR calc Af Amer: >60 mL/min (ref >60)
GFR calc non Af Amer: 54 mL/min — ABNORMAL LOW (ref >60)
Potassium: 4.3 mEq/L (ref 3.5–5.1)
Sodium: 142 mEq/L (ref 135–145)
Total Bilirubin: 0.4 mg/dL (ref 0.3–1.2)

## 2010-09-01 LAB — CBC
Hemoglobin: 11.2 g/dL — ABNORMAL LOW (ref 12.0–15.0)
MCH: 25.9 pg — ABNORMAL LOW (ref 26.0–34.0)
RBC: 4.32 MIL/uL (ref 3.87–5.11)
WBC: 4.1 10*3/uL (ref 4.0–10.5)

## 2010-09-01 LAB — DIFFERENTIAL
Basophils Relative: 1 % (ref 0–1)
Monocytes Relative: 8 % (ref 3–12)
Neutro Abs: 2.7 10*3/uL (ref 1.7–7.7)
Neutrophils Relative %: 66 % (ref 43–77)

## 2010-09-03 ENCOUNTER — Inpatient Hospital Stay (HOSPITAL_COMMUNITY)
Admission: RE | Admit: 2010-09-03 | Discharge: 2010-09-05 | DRG: 580 | Disposition: A | Discharge status: Home Health | Payer: Medicare Other | Source: Ambulatory Visit | Attending: Surgery | Admitting: Surgery

## 2010-09-03 ENCOUNTER — Inpatient Hospital Stay (HOSPITAL_COMMUNITY): Payer: Medicare Other

## 2010-09-03 ENCOUNTER — Other Ambulatory Visit: Payer: Self-pay | Admitting: Surgery

## 2010-09-03 ENCOUNTER — Other Ambulatory Visit (HOSPITAL_COMMUNITY): Payer: Self-pay

## 2010-09-03 DIAGNOSIS — J45909 Unspecified asthma, uncomplicated: Secondary | ICD-10-CM | POA: Diagnosis present

## 2010-09-03 DIAGNOSIS — C50919 Malignant neoplasm of unspecified site of unspecified female breast: Secondary | ICD-10-CM

## 2010-09-03 DIAGNOSIS — D62 Acute posthemorrhagic anemia: Secondary | ICD-10-CM | POA: Diagnosis not present

## 2010-09-03 DIAGNOSIS — Z87898 Personal history of other specified conditions: Secondary | ICD-10-CM

## 2010-09-03 DIAGNOSIS — G20 Parkinson's disease: Secondary | ICD-10-CM | POA: Diagnosis present

## 2010-09-03 DIAGNOSIS — G20A1 Parkinson's disease without dyskinesia, without mention of fluctuations: Secondary | ICD-10-CM | POA: Diagnosis present

## 2010-09-03 DIAGNOSIS — D0590 Unspecified type of carcinoma in situ of unspecified breast: Principal | ICD-10-CM | POA: Diagnosis present

## 2010-09-03 HISTORY — PX: MASTECTOMY: SHX3

## 2010-09-03 MED ORDER — TECHNETIUM TC 99M SULFUR COLLOID FILTERED
1.0000 | Freq: Once | INTRAVENOUS | Status: AC | PRN
  Administered 2010-09-03: 1 via INTRADERMAL

## 2010-09-03 NOTE — Letter (Signed)
Summary: [facility]   Imported By: Marylou Mccoy 08/25/2010 15:21:52  _____________________________________________________________________  External Attachment:    Type:   Image     Comment:   External Document

## 2010-09-04 LAB — CBC
Platelets: 118 10*3/uL — ABNORMAL LOW (ref 150–400)
RBC: 3.73 MIL/uL — ABNORMAL LOW (ref 3.87–5.11)
WBC: 4.4 10*3/uL (ref 4.0–10.5)

## 2010-09-04 LAB — BASIC METABOLIC PANEL
Chloride: 105 mEq/L (ref 96–112)
GFR calc Af Amer: >60 mL/min (ref >60)
Potassium: 4.3 mEq/L (ref 3.5–5.1)
Sodium: 137 mEq/L (ref 135–145)

## 2010-09-05 NOTE — Op Note (Signed)
NAMEMERELIN, HUMAN        ACCOUNT NO.:  [id number]  MEDICAL RECORD NO.:  [id number]           PATIENT TYPE:  I  LOCATION:  5126                         FACILITY:  [facility]  PHYSICIAN:  Wilmon Arms. Corliss Skains, M.D. DATE OF BIRTH:  20-Oct-1927  DATE OF PROCEDURE:  09/03/2010 DATE OF DISCHARGE:                              OPERATIVE REPORT   PREOPERATIVE DIAGNOSIS:  Bilaterally breast cancer.  POSTOPERATIVE DIAGNOSIS:  Bilaterally breast cancer.  PROCEDURE PERFORMED:  Bilateral mastectomies, bilateral sentinel lymph node biopsies, and blue dye injection.  SURGEON:  Wilmon Arms. Corliss Skains, MD  ASSISTANT:  Currie Paris, MD  ANESTHESIA:  General.  INDICATIONS:  This is an 77 year old female who recently had an abnormal mammogram on the right.  She underwent biopsy showing DCIS with some low- grade invasive ductal carcinoma.  After initial evaluation, she was also noted have a left-sided palpable mass.  This was later biopsied and also returned with DCIS and invasive ductal carcinoma.  She presents now for bilateral mastectomies and bilateral sentinel node biopsies.  DESCRIPTION OF PROCEDURE:  The patient was injected with technetium sulfur colloid in the holding area.  She was brought to the operating room, placed in supine position on the operating table.  After an adequate level of general anesthesia was obtained, the patient's chest and axilla were prepped with ChloraPrep, draped in sterile fashion. Time-out was taken to assure the proper patient and proper procedure. We outlined elliptical incisions on both sides of the chest involving the nipple-areolar complexes.  We began on the right side.  We made skin incisions and raised skin flaps using the mastectomy hooks.  We dissected to the edge of the sternum medially, the inframammary crease inferiorly, and the infraclavicular chest wall superiorly.  We dissected laterally until we could see the anterior edge of the latissimus  dorsi. We then used the NeoProbe to identify an area of increased activity.  We identified an area showing a blue node.  We dissected this free.  Ex vivo count was 1367.  This was sent as sentinel lymph node #1 from the right side.  We then interrogated the axilla again.  This showed activity of over 700.  Up under the edge of the pectoralis muscle, we were able to find another tiny sentinel lymph node with activity of 703. No other significant activity was noted.  We then dissected the breast free from the pectoralis muscle taking the pectoralis fascia.  We dissected the entire breast free, leaving the axillary contents.  The right chest was then irrigated and we inspected for hemostasis.  Several hemoclips were used.  We packed this with a wet sponge.  We then turned our attention to the left side.  We raised similar skin flaps and did a similar dissection.  We were able to look at one lymph node in the left axilla which had activity of 176.  This was also blue.  This was sent as sentinel lymph node #1 from the left.  No other activity was noted in the axilla.  We then removed the left breast tissue in similar fashion. We thoroughly irrigated and inspected for hemostasis.  We placed a single  19-Blake drain on each side.  We secured these with 2-0 nylon. The wounds were closed with subcutaneous 3-0 Vicryl with interrupted sutures.  Staples were used to close the skin.  Both drains were then placed to bulb suction.  The wounds were dressed with Xeroform gauze and dry 4 x 4's.  The patient was extubated and brought to recovery in stable condition.  All sponge, instrument, needle counts were correct.     Wilmon Arms. Corliss Skains, M.D.     MKT/MEDQ  D:  09/03/2010  T:  09/04/2010  Job:  [id number]  Electronically Signed by Manus Rudd M.D. on 09/05/2010 04:11:17 PM

## 2010-09-08 NOTE — Discharge Summary (Signed)
  [name], [name]        ACCOUNT NO.:  [id number]  MEDICAL RECORD NO.:  [id number]           PATIENT TYPE:  I  LOCATION:  5126                         FACILITY:  [facility]  PHYSICIAN:  Wilmon Arms. Corliss Skains, M.D. DATE OF BIRTH:  1927/08/02  DATE OF ADMISSION:  09/03/2010 DATE OF DISCHARGE:  09/05/2010                              DISCHARGE SUMMARY   ADMISSION DIAGNOSIS:  Bilateral breast cancer.  FINAL DIAGNOSIS:  Bilateral breast cancer.  PROCEDURE:  Bilateral mastectomies, bilateral sentinel lymph node biopsies.  This is an 77 year old female who was recently diagnosed with bilateral breast cancers.  She was admitted to the hospital on September 03, 2010, where she underwent bilateral mastectomies and bilateral sentinel lymph node biopsies with a blue dye injection.  The patient has done reasonably well postoperatively.  She is having minimal pain and this is well controlled with p.o. pain medication.  Her incisions looked good and are healing well.  Both drains are functioning and are draining some serosanguineous fluid.  DISCHARGE INSTRUCTIONS:  She was given Percocet p.r.n. for pain.  We will try to arrange home health to help with her drain care at least for the first couple of days.  She may eat a regular diet.  She should sponge bathe only.  Follow up will be arranged by my office but will be for next week to possibly remove the drain.     Wilmon Arms. Corliss Skains, M.D.     MKT/MEDQ  D:  09/05/2010  T:  09/05/2010  Job:  [id number]  Electronically Signed by Manus Rudd M.D. on 09/08/2010 04:53:41 PM

## 2010-09-10 ENCOUNTER — Encounter: Payer: Self-pay | Admitting: Internal Medicine

## 2010-09-10 ENCOUNTER — Encounter (HOSPITAL_BASED_OUTPATIENT_CLINIC_OR_DEPARTMENT_OTHER): Payer: Medicare Other | Admitting: Oncology

## 2010-09-10 DIAGNOSIS — T451X5A Adverse effect of antineoplastic and immunosuppressive drugs, initial encounter: Secondary | ICD-10-CM

## 2010-09-10 DIAGNOSIS — D693 Immune thrombocytopenic purpura: Secondary | ICD-10-CM

## 2010-09-10 DIAGNOSIS — D630 Anemia in neoplastic disease: Secondary | ICD-10-CM

## 2010-09-10 DIAGNOSIS — D6481 Anemia due to antineoplastic chemotherapy: Secondary | ICD-10-CM

## 2010-09-10 DIAGNOSIS — C8589 Other specified types of non-Hodgkin lymphoma, extranodal and solid organ sites: Secondary | ICD-10-CM

## 2010-09-23 NOTE — Progress Notes (Signed)
Summary: [facility]: Office Progress Note  [facility]: Office Progress Note   Imported By: Earl Many 09/17/2010 09:05:53  _____________________________________________________________________  External Attachment:    Type:   Image     Comment:   External Document

## 2010-10-13 ENCOUNTER — Encounter (HOSPITAL_BASED_OUTPATIENT_CLINIC_OR_DEPARTMENT_OTHER): Payer: Medicare Other | Admitting: Oncology

## 2010-10-13 ENCOUNTER — Other Ambulatory Visit: Payer: Self-pay | Admitting: Oncology

## 2010-10-13 DIAGNOSIS — T451X5A Adverse effect of antineoplastic and immunosuppressive drugs, initial encounter: Secondary | ICD-10-CM

## 2010-10-13 DIAGNOSIS — D630 Anemia in neoplastic disease: Secondary | ICD-10-CM

## 2010-10-13 DIAGNOSIS — D649 Anemia, unspecified: Secondary | ICD-10-CM

## 2010-10-13 DIAGNOSIS — C8589 Other specified types of non-Hodgkin lymphoma, extranodal and solid organ sites: Secondary | ICD-10-CM

## 2010-10-13 DIAGNOSIS — D693 Immune thrombocytopenic purpura: Secondary | ICD-10-CM

## 2010-10-13 LAB — CBC WITH DIFFERENTIAL/PLATELET
Basophils Absolute: 0 10*3/uL (ref 0.0–0.1)
Eosinophils Absolute: 0.1 10*3/uL (ref 0.0–0.5)
HCT: 30.5 % — ABNORMAL LOW (ref 34.8–46.6)
HGB: 9.7 g/dL — ABNORMAL LOW (ref 11.6–15.9)
MCH: 25 pg — ABNORMAL LOW (ref 25.1–34.0)
MONO#: 0.3 10*3/uL (ref 0.1–0.9)
NEUT#: 2.1 10*3/uL (ref 1.5–6.5)
NEUT%: 59.3 % (ref 38.4–76.8)
WBC: 3.5 10*3/uL — ABNORMAL LOW (ref 3.9–10.3)
lymph#: 1 10*3/uL (ref 0.9–3.3)

## 2010-10-14 ENCOUNTER — Other Ambulatory Visit: Payer: Self-pay | Admitting: Oncology

## 2010-10-14 LAB — CHCC SMEAR

## 2010-10-14 NOTE — Progress Notes (Signed)
Summary: [facility]: Office Visit  [facility]: Office Visit   Imported By: Earl Many 10/03/2010 13:58:29  _____________________________________________________________________  External Attachment:    Type:   Image     Comment:   External Document

## 2010-11-02 LAB — CBC
HCT: 34.2 % — ABNORMAL LOW (ref 36.0–46.0)
HCT: 34.7 % — ABNORMAL LOW (ref 36.0–46.0)
Hemoglobin: 12 g/dL (ref 12.0–15.0)
Hemoglobin: 12.1 g/dL (ref 12.0–15.0)
MCHC: 35 g/dL (ref 30.0–36.0)
MCV: 89.2 fL (ref 78.0–100.0)
Platelets: 147 10*3/uL — ABNORMAL LOW (ref 150–400)
RBC: 3.9 MIL/uL (ref 3.87–5.11)
RDW: 12.9 % (ref 11.5–15.5)
WBC: 5.1 10*3/uL (ref 4.0–10.5)

## 2010-11-02 LAB — COMPREHENSIVE METABOLIC PANEL
AST: 20 U/L (ref 0–37)
Alkaline Phosphatase: 99 U/L (ref 39–117)
BUN: 12 mg/dL (ref 6–23)
CO2: 28 mEq/L (ref 19–32)
Chloride: 106 mEq/L (ref 96–112)
Creatinine, Ser: 0.82 mg/dL (ref 0.4–1.2)
GFR calc non Af Amer: >60 mL/min (ref >60)
Potassium: 4.1 mEq/L (ref 3.5–5.1)
Total Bilirubin: 0.6 mg/dL (ref 0.3–1.2)

## 2010-11-02 LAB — DIFFERENTIAL
Basophils Absolute: 0 10*3/uL (ref 0.0–0.1)
Basophils Relative: 1 % (ref 0–1)
Eosinophils Relative: 4 % (ref 0–5)
Lymphocytes Relative: 16 % (ref 12–46)
Monocytes Absolute: 0.3 10*3/uL (ref 0.1–1.0)

## 2010-11-02 LAB — APTT: aPTT: 35 seconds (ref 24–37)

## 2010-11-02 LAB — PROTIME-INR: INR: 0.9 (ref 0.00–1.49)

## 2010-11-11 ENCOUNTER — Encounter (HOSPITAL_BASED_OUTPATIENT_CLINIC_OR_DEPARTMENT_OTHER): Payer: Medicare Other | Admitting: Oncology

## 2010-11-11 ENCOUNTER — Other Ambulatory Visit: Payer: Self-pay | Admitting: Oncology

## 2010-11-11 DIAGNOSIS — D693 Immune thrombocytopenic purpura: Secondary | ICD-10-CM

## 2010-11-11 DIAGNOSIS — D630 Anemia in neoplastic disease: Secondary | ICD-10-CM

## 2010-11-11 DIAGNOSIS — T451X5A Adverse effect of antineoplastic and immunosuppressive drugs, initial encounter: Secondary | ICD-10-CM

## 2010-11-11 DIAGNOSIS — D6481 Anemia due to antineoplastic chemotherapy: Secondary | ICD-10-CM

## 2010-11-11 DIAGNOSIS — C8590 Non-Hodgkin lymphoma, unspecified, unspecified site: Secondary | ICD-10-CM

## 2010-11-11 DIAGNOSIS — C8589 Other specified types of non-Hodgkin lymphoma, extranodal and solid organ sites: Secondary | ICD-10-CM

## 2010-11-11 LAB — CBC WITH DIFFERENTIAL/PLATELET
BASO%: 0.8 % (ref 0.0–2.0)
EOS%: 2 % (ref 0.0–7.0)
HCT: 28.6 % — ABNORMAL LOW (ref 34.8–46.6)
MCH: 24.7 pg — ABNORMAL LOW (ref 25.1–34.0)
MCHC: 31.8 g/dL (ref 31.5–36.0)
MONO#: 0.3 10*3/uL (ref 0.1–0.9)
RBC: 3.69 10*6/uL — ABNORMAL LOW (ref 3.70–5.45)
RDW: 14.4 % (ref 11.2–14.5)
WBC: 3.6 10*3/uL — ABNORMAL LOW (ref 3.9–10.3)
lymph#: 0.8 10*3/uL — ABNORMAL LOW (ref 0.9–3.3)
nRBC: 0 % (ref 0–0)

## 2010-11-11 LAB — RETICULOCYTES (CHCC)
RBC.: 3.72 MIL/uL — ABNORMAL LOW (ref 3.87–5.11)
Retic Ct Pct: 1.5 % (ref 0.4–3.1)

## 2010-11-11 LAB — CHCC SMEAR

## 2010-11-24 ENCOUNTER — Encounter (HOSPITAL_COMMUNITY): Payer: Self-pay

## 2010-11-24 ENCOUNTER — Ambulatory Visit (HOSPITAL_COMMUNITY)
Admission: RE | Admit: 2010-11-24 | Discharge: 2010-11-24 | Disposition: A | Discharge status: Home / Self Care | Payer: Medicare Other | Source: Ambulatory Visit | Attending: Oncology | Admitting: Oncology

## 2010-11-24 DIAGNOSIS — R599 Enlarged lymph nodes, unspecified: Secondary | ICD-10-CM | POA: Insufficient documentation

## 2010-11-24 DIAGNOSIS — I722 Aneurysm of renal artery: Secondary | ICD-10-CM | POA: Insufficient documentation

## 2010-11-24 DIAGNOSIS — J984 Other disorders of lung: Secondary | ICD-10-CM | POA: Insufficient documentation

## 2010-11-24 DIAGNOSIS — C8583 Other specified types of non-Hodgkin lymphoma, intra-abdominal lymph nodes: Secondary | ICD-10-CM | POA: Insufficient documentation

## 2010-11-24 DIAGNOSIS — Z9010 Acquired absence of unspecified breast and nipple: Secondary | ICD-10-CM | POA: Insufficient documentation

## 2010-11-24 DIAGNOSIS — C8590 Non-Hodgkin lymphoma, unspecified, unspecified site: Secondary | ICD-10-CM

## 2010-11-24 DIAGNOSIS — C50919 Malignant neoplasm of unspecified site of unspecified female breast: Secondary | ICD-10-CM | POA: Insufficient documentation

## 2010-11-24 DIAGNOSIS — D693 Immune thrombocytopenic purpura: Secondary | ICD-10-CM | POA: Insufficient documentation

## 2010-11-24 HISTORY — DX: Non-Hodgkin lymphoma, unspecified, unspecified site: C85.90

## 2010-11-24 MED ORDER — IOHEXOL 300 MG/ML  SOLN: 100.0000 mL | Freq: Once | INTRAMUSCULAR | Status: AC | PRN

## 2010-11-28 ENCOUNTER — Encounter (HOSPITAL_BASED_OUTPATIENT_CLINIC_OR_DEPARTMENT_OTHER): Payer: Medicare Other | Admitting: Oncology

## 2010-11-28 ENCOUNTER — Other Ambulatory Visit: Payer: Self-pay | Admitting: Oncology

## 2010-11-28 DIAGNOSIS — D630 Anemia in neoplastic disease: Secondary | ICD-10-CM

## 2010-11-28 DIAGNOSIS — T451X5A Adverse effect of antineoplastic and immunosuppressive drugs, initial encounter: Secondary | ICD-10-CM

## 2010-11-28 DIAGNOSIS — C8589 Other specified types of non-Hodgkin lymphoma, extranodal and solid organ sites: Secondary | ICD-10-CM

## 2010-11-28 DIAGNOSIS — D693 Immune thrombocytopenic purpura: Secondary | ICD-10-CM

## 2010-11-28 LAB — CBC & DIFF AND RETIC
BASO%: 0.8 % (ref 0.0–2.0)
Basophils Absolute: 0 10*3/uL (ref 0.0–0.1)
EOS%: 2.1 % (ref 0.0–7.0)
HGB: 10 g/dL — ABNORMAL LOW (ref 11.6–15.9)
MCH: 24.7 pg — ABNORMAL LOW (ref 25.1–34.0)
RDW: 14.7 % — ABNORMAL HIGH (ref 11.2–14.5)
Retic %: 1.11 % (ref 0.50–1.50)
Retic Ct Abs: 44.96 10*3/uL (ref 18.30–72.70)
WBC: 3.9 10*3/uL (ref 3.9–10.3)
lymph#: 1 10*3/uL (ref 0.9–3.3)

## 2010-11-28 LAB — COMPREHENSIVE METABOLIC PANEL
AST: 19 U/L (ref 0–37)
Alkaline Phosphatase: 100 U/L (ref 39–117)
BUN: 16 mg/dL (ref 6–23)
Creatinine, Ser: 0.94 mg/dL (ref 0.40–1.20)

## 2010-12-02 LAB — HEPATITIS B CORE ANTIBODY, TOTAL: Hep B Core Total Ab: NEGATIVE

## 2010-12-04 ENCOUNTER — Encounter (HOSPITAL_BASED_OUTPATIENT_CLINIC_OR_DEPARTMENT_OTHER): Payer: Medicare Other | Admitting: Oncology

## 2010-12-04 DIAGNOSIS — C8589 Other specified types of non-Hodgkin lymphoma, extranodal and solid organ sites: Secondary | ICD-10-CM

## 2010-12-04 DIAGNOSIS — Z5111 Encounter for antineoplastic chemotherapy: Secondary | ICD-10-CM

## 2010-12-04 DIAGNOSIS — Z5112 Encounter for antineoplastic immunotherapy: Secondary | ICD-10-CM

## 2010-12-05 ENCOUNTER — Encounter (HOSPITAL_BASED_OUTPATIENT_CLINIC_OR_DEPARTMENT_OTHER): Payer: Medicare Other | Admitting: Oncology

## 2010-12-05 DIAGNOSIS — C8589 Other specified types of non-Hodgkin lymphoma, extranodal and solid organ sites: Secondary | ICD-10-CM

## 2010-12-05 DIAGNOSIS — Z5111 Encounter for antineoplastic chemotherapy: Secondary | ICD-10-CM

## 2010-12-09 NOTE — Assessment & Plan Note (Signed)
[facility]                            CARDIOLOGY OFFICE NOTE   NAME:[name], [name]                 MRN:          [id number]  DATE:07/30/2008                            DOB:          1928/07/25    ONCOLOGIST:  Leighton Roach. Truett Perna, MD   INTERVAL HISTORY:  Ms. Mccullum is a 77 year old woman with a history  of recurrent non-Hodgkin lymphoma, retroperitoneal lymphadenopathy  associated with left hydronephrosis as well as hypertension and renal  insufficiency.  We have been following her for her hypertension and her  lower extremity edema.   She returns today for routine followup.  She says she is doing quite  well.  She is sad over the recent death of her brother.  Previously, we  have treated her blood pressure with Coreg, Norvasc, and spironolactone.  She said each of these had significant side effects of fatigue and  Norvasc worsened her edema, so she has stopped.  She says that her blood  pressure as she has been doing pretty well with systolics usually in the  140s.  Her lower extremity edema has also resolved and she has stopped  taking her Demadex.   She has not had any chest pain.  She does have occasional dyspnea.  No  focal neurologic symptoms.   CURRENT MEDICATIONS:  1  Os-Cal.  1. Multivitamin.  2. Acyclovir 200 b.i.d.  3. Advair b.i.d.   PHYSICAL EXAMINATION:  GENERAL:  She is an elderly woman, no acute  distress, ambulates around the clinic without respiratory difficulty.  VITAL SIGNS:  Blood pressure is 140/82, heart rate 83, and weight is  130.  HEENT:  Normal.  NECK:  Supple.  There is no JVD.  Carotids are 2+ bilaterally without  bruits.  There is no lymphadenopathy or thyromegaly.  CARDIAC:  Shows a  2/6 systolic ejection murmur at the left sternal border as well as a 2/6  systolic ejection murmur at the right sternal border as to is well  preserved.  LUNGS:  Clear.  ABDOMEN:  Soft, nontender, and nondistended.  No  hepatosplenomegaly, no  bruits, no masses.  Good bowel sounds.  EXTREMITIES:  Warm with no  cyanosis, clubbing, or edema.  NEUROLOGIC:  Alert and oriented x3.  Cranial nerves II-XII are intact.  Moves all 4 extremities without difficulty.   ASSESSMENT AND PLAN:  1. Hypertension.  Blood pressure remains borderline, but it is much      improved with all antihypertensive medications.  I suspect that she      may have had an adverse reaction to one of her chemotherapy      medications earlier in a year, which caused her blood pressure to      spike.  It is much better now, although not ideal.  I given her      symptoms with her previous even low-dose antihypertensive      medications.  I feel okay watching her blood pressure as long as      her systolics are under 150.  I explained this to her and her      husband.  They will  keep a blood pressure log.  Should the blood      pressure be over 150 persistently, they will contact me and we will      initiate therapy once again.  2. Lower extremity edema.  This is resolved.  She is off diuretics.      We will follow as needed.  3. Aortic stenosis.  This is a very mild by echocardiogram.  We will      follow every 2 years.   DISPOSITION:  Return to clinic in 6 months for followup.     Bevelyn Buckles. Bensimhon, MD  Electronically Signed    DRB/MedQ  DD: 07/30/2008  DT: 07/31/2008  Job #: [id number]   cc:   Leighton Roach. Truett Perna, M.D.

## 2010-12-09 NOTE — Assessment & Plan Note (Signed)
[facility]                            CARDIOLOGY OFFICE NOTE   NAME:Mathews, [name]                 MRN:          [id number]  DATE:12/21/2007                            DOB:          10-19-1927    REASON FOR CONSULTATION:  Severe dyspnea.   [name] is a very pleasant 77 year old woman who does not have  any known significant cardiac history.  She has never had a stress test  or a cardiac catheterization.   In 2004, she was diagnosed with non-Hodgkin's lymphoma.  She was treated  with Rituxan.  She did not have radiation.  She went into remission.  In  2005 there was some lung nodules and she underwent a VATS procedure by  Dr. Edwyna Shell and underwent lung biopsy.  The pathology on this was  negative.  However, there was still some concern of possible underlying  lymphoma according to [name].   She went to [place] in March for several weeks, and while she was there  she was doing very well.  She was able to walk all over the place  without any problem.  However, upon her return she began to be very  short of breath.  Chest x-ray revealed some fluid around her previous  operative lung site.  This was thought to be a pleural effusion with  possible recurrent lymphoma.  She was evaluated for thoracentesis,  apparently, but there was not enough fluid to tap.  Earlier this month  she underwent a non-contrast CT scan of the chest which showed a  recurrent lymphoma and retroperitoneal mass.  There was some evidence of  mild obstruction, but no evidence of total obstruction of the ureter.   Now she complains of being very short of breath to the point where she  cannot even walk 15 to 20 feet without getting very dyspneic.  Dr.  Truett Perna drew a BNP and it was just minimally elevated at 125.  She has  had some lower extremity edema but this has gotten better with Lasix.  She denies any orthopnea.  She has not had any chest pressure or pain.  No PND.  She does not have a history of tobacco use, but does have some  second-hand smoke exposure.  She has not had fevers, chills, or cough.  She is just about to begin her chemotherapy.   REVIEW OF SYSTEMS:  Review of systems is otherwise notable for a history  of asthma, anemia, fatigue, headaches, and occasional lightheadedness.  The remainder of the review of systems is negative except for HPI and  problem list.   PROBLEM LIST:  1. Non-Hodgkin's lymphoma with recurrence as described above.  2. History of asthma.  3. Hypertension.   CURRENT MEDICATIONS:  1. Allopurinol 300 a day.  2. Lasix 20 a day.  3. Avelox 400 a day.  4. Acyclovir.  5. Os-Cal.  6. Multivitamin.  7. Fludara chemotherapy.  8. Rituxan chemotherapy.   ALLERGIES:  No known drug allergies.   SOCIAL HISTORY:  She is married.  She has 4 grown children.  Denies any  significant tobacco or alcohol use.  FAMILY HISTORY:  There is no family history of premature coronary artery  disease.   PHYSICAL EXAMINATION:  She looks younger than her stated age; however,  she has significant dyspnea just walking a few feet.  Respiratory effort  at rest is not increased.  Blood pressure is 160/70 with a heart rate of 95.  HEENT:  Normal.  NECK:  Supple.  It is hard to see her JVD, but it does not appear  markedly elevated.  Carotids are 2+ bilaterally with bilateral bruits.  These are likely radiated off her aortic valve.  There is no  lymphadenopathy or thyromegaly appreciated.  CARDIAC:  Her PMI is nondisplaced.  She has an S4.  There is a 2/6  systolic ejection murmur at the right sternal border radiating across  the precordium.  S2 is preserved.  LUNGS:  She has very diminished air movement; however, there is no  wheezing just mild bibasilar crackles, but very blunted breath sounds at  the bases.  ABDOMEN:  Soft, nontender, nondistended, no hepatosplenomegaly, no  bruits, no obvious masses palpated.   EXTREMITIES:  Warm with no cyanosis or clubbing, there is just trace  edema.  Distal pulses are 1+.  NEURO:  Alert and oriented x3.  Cranial nerves II-XII are intact.  Moves  all 4 extremities without difficulty.  Affect is pleasant.   On ambulating around the clinic, saturations stay in the mid 90s on room  air.   EKG shows normal sinus rhythm at 90, no ST-T wave abnormalities.   We performed an echocardiogram in the office.  Preliminary  interpretation by me is ejection fraction of 65%.  She has mild left  ventricular hypertrophy.  There is evidence of mild diastolic  dysfunction.  Aortic valve is moderately calcified.  There is very mild  aortic stenosis.  The RV looks okay.  No obvious pericardial effusion.   ASSESSMENT AND PLAN:  Dyspnea:  This is a very tough situation.  I am  not exactly sure what the etiology of her severe dyspnea is.  She does  have some evidence of diastolic dysfunction, but I do not think that she  has significant fluid overload especially to explain her symptoms.  However, we will go ahead and check another BNP.  I am somewhat worried  about the possibility of a pulmonary embolism as this occurred on the  way back from her trip from [place].  We will check a D-dimer.  Other  possibilities include lung involvement with low lymphoma and coronary  artery disease.  We have also gone ahead and ordered a Myoview.   DISPOSITION:  We will wait for the results of her labs to come back.  If  her D-dimer is markedly elevated, I do think it would be worth checking  a CT scan of the chest to rule out pulmonary embolus, also help [place]  evaluate for possible worsening effusions or other airspace disease.  I  will discuss this with Dr. Truett Perna.   ADDENDUM:  Labs came back after I saw [name].  BNP was 430 which  was definitely more elevated than it was before, but not  exorbitantly  so.  Troponin was negative.  D-dimer was 7.7.  I have contacted Dr.  Truett Perna and  we are going to proceed with a CT scan of the chest to rule  out pulmonary embolus.  If this is negative we will see her back early  next week and initiate more aggressive treatment for heart failure  and  also see if we cannot get the results of her Myoview.   Total time spent:  Approximately an hour and 15 minutes.     Bevelyn Buckles. Bensimhon, MD  Electronically Signed    DRB/MedQ  DD: 12/22/2007  DT: 12/22/2007  Job #: [id number]   cc:   Leighton Roach. Truett Perna, M.D.  Ines Bloomer, M.D.

## 2010-12-09 NOTE — H&P (Signed)
NAMEJACOBA, CHERNEY        ACCOUNT NO.:  [id number]   MEDICAL RECORD NO.:  [id number]          PATIENT TYPE:  AMB   LOCATION:  SDS                          FACILITY:  [facility]   PHYSICIAN:  Carolan Shiver, M.D.    DATE OF BIRTH:  [birth year]   DATE OF ADMISSION:  02/22/2009  DATE OF DISCHARGE:  02/22/2009                              HISTORY & PHYSICAL   CHIEF COMPLAINT:  Inability to hear from her right ear.   HISTORY OF PRESENT ILLNESS:  Heath Badon is a very pleasant 77-  year-old white female who has had a lifelong history of chronic ear  disease.  She underwent a right modified radical mastoidectomy with an  incus transposition for right middle ear and attic cholesteatoma by  myself on January 06, 1996.  She then underwent a revision tympanoplasty of  her right ear with a Nanetta Batty ossiculoplasty on October 31, 1998.  She was left with a moderately severe mixed hearing loss due to absence  of the middle ear space due to chronic eustachian tube dysfunction.  She  was fitted with a left in-the-ear hearing aid in the left ear, but was  struggling to hear.  Preop audiometric testing on February 19, 2009,  documented an SRT of 85 dB with 88% discrimination in the right ear and  an SRT of 35 dB with 92% discrimination in the left ear.  She had a  moderately severe mixed hearing loss in the right ear.  She tried a BAHA  Cordelle headband stimulation and heard well from the right ear with a  BAHA.  She was therefore counseled that she could do nothing or proceed  with the [facility] implant of her right temporal bone.   The patient is known to have non-Hodgkin lymphoma and has received 2  courses of chemotherapy.  Her oncologist is Dr. Rolm Baptise.  I  discussed whether the patient could undergo a BAHA implant with him and  he cleared her for the procedure.   Risks, complications, and alternatives of the procedure were explained  to the patient and to her husband, [name].   Questions were invited and  answered and she elected to proceed with a BAHA implant of her right  temporal bone.   PAST MEDICAL HISTORY:  Positive for non-Hodgkin lymphoma, in remission.   MEDICATIONS:  Her medications include acyclovir, multivitamins, and  Advair.   ALLERGIES:  She has no known allergies.   PREVIOUS PROCEDURES:  Right modified radical mastoidectomy in 1997 and a  revision of Nanetta Batty ossiculoplasty of her right ear in April  2000.   FAMILY HISTORY:  Positive for lung disease.   SOCIAL HISTORY:  She is married, retired, has several children, has a  high school education, does not use alcohol or tobacco products.   REVIEW OF SYSTEMS:  Positive for hypertension, asthma, and the treated  non-Hodgkin lymphoma.   PHYSICAL EXAMINATION:  VITAL SIGNS:  Stable.  GENERAL:  Awake, alert, spontaneous, coherent, and logical.  HEENT:  Head was normocephalic with bilateral symmetric facial motion.  Her right external ear was stable.  She had a healed right postauricular  incision and a stable well epithelialized right mastoid cavity without  signs of cholesteatoma or infection.  Left tympanic membrane was intact  and mobile.  Nose was negative.  Oral cavity, lips, and palate were  normal.  NECK:  Negative.  CHEST:  Clear.  HEART:  Normal sinus rhythm.  BREAST:  Not performed.  ABDOMEN:  Benign.  GENITALIA AND RECTAL:  Not performed.  EXTREMITIES:  Unremarkable.  NEUROLOGIC:  Physiologic with the exception of inability to hear from  the right ear and the sensorineural hearing loss in the left ear.   ADMISSION LABORATORY DATA:  Hemoglobin of 12.1 with hematocrit of 34.7,  white blood cell count was 5100.  PT was 12.6, PTT 35, INR 0.9.  Electrolytes were within normal limits with a sodium of 114 and  potassium of 4.1.  Calcium was 9.2.  Chest x-ray showed chronic  bibasilar densities, slightly improved in the right lung base with  residual densities, most likely  representing scarring.  There is a trace  right effusion.  EKG showed normal sinus rhythm.   Preop audiometric testing showed SRT of 85 dB right ear with 88%  discrimination at 100 dB HTL.  She had SRT of 35 dB left ear with 92%  discrimination at 75 dB HTL.   IMPRESSION:  1. Moderately severe mixed hearing loss, right ear with 88%      discrimination.  2. Moderate sensorineural hearing loss, left ear with an SRT of 35 dB      and 92% discrimination.  3. Non-Hodgkin lymphoma, in remission.   PLAN:  The patient is recommended for a BAHA osseointegrated implant,  right temporal bone on the morning of February 22, 2009, at 7:30 a.m., [facility] OR room #3 under general endotracheal anesthesia.  Risks,  complications, and alternatives of the procedure were explained to the  patient and her husband.  Questions were invited and answered and  informed consent was signed and witnessed.   Approximately 6 weeks' postoperatively, the patient will be fit with a  BAHA [employer] sound processor.      Carolan Shiver, M.D.  Electronically Signed     EMK/MEDQ  D:  02/22/2009  T:  02/22/2009  Job:  [id number]   cc:   Leighton Roach. Truett Perna, M.D.  Melida Quitter, M.D.  Bevelyn Buckles. Bensimhon, MD  Genene Churn. Love, M.D.  Claude Manges. Cleophas Dunker, M.D.

## 2010-12-09 NOTE — Assessment & Plan Note (Signed)
[facility]                            CARDIOLOGY OFFICE NOTE   NAME:[name], [name]                 MRN:          [id number]  DATE:01/26/2008                            DOB:          09-15-1927    ONCOLOGIST:  Leighton Roach. Truett Perna, MD   INTERVAL HISTORY:  [name] is a 77 year old woman with a history  of recurrent non-Hodgkin lymphoma with retroperitoneal lymphadenopathy  associated with left hydronephrosis as well as severe hypertension and  renal insufficiency.  She comes today for followup on her lower  extremity edema.   She had been undergoing chemotherapy with Dr. Truett Perna.  Unfortunately a  few days ago, her blood pressure was markedly elevated, systolics in the  220-230 range.  This was associated with a headache.  She was  hospitalized and started on Norvasc.  She has had a nice response to  this, and her blood pressures come down anywhere from the 140-170 range.  Her headache has resolved.  Unfortunately, she has been having problems  with severe lower extremity edema.  She denies any orthopnea or PND.  She does have some mild exertional dyspnea associated with fatigue.   CURRENT MEDICATIONS:  1. Os-Cal.  2. Multivitamin.  3. Rituxan.  4. Fludara.  5. Acyclovir 200 b.i.d.  6. Norvasc 5 daily.   PHYSICAL EXAMINATION:  GENERAL:  She is a fatigued-appearing woman, in  no acute distress, ambulates around the clinic without any respiratory  difficulty.  VITAL SIGNS:  Blood pressure is 186/88, heart rate is 93, and weight is  144.  HEENT:  Normal.  NECK:  Supple.  JVD is elevated all the way to her ear.  Carotids are 2+  bilaterally without bruits.  There is no lymphadenopathy or thyromegaly.  CARDIAC:  Regular rate and rhythm with a 2/6 systolic ejection murmur at  the left sternal border.  There is also a 2/6 systolic ejection murmur  at the right sternal border.  LUNGS:  Clear.  ABDOMEN:  Distended.  No Hepatosplenomegaly,  no bruits, no masses  appreciated.  EXTREMITIES:  Warm.  She has at least 3+ edema bilaterally up to the  knees.  There is no significant thigh edema.  There is no skin  breakdown.  NEUROLOGIC:  Alert and oriented x3.  Cranial nerves II through XII are  intact.  Moves all 4 extremities without difficulty.   Review of her echocardiogram shows EF of 60-70% with very mild aortic  stenosis and evidence of diastolic dysfunction.  Estimated pulmonary  pressures, peak pulmonary pressure was not reported due to the lack of  significant TR jet.   ASSESSMENT AND PLAN:  1. Severe lower extremity edema.  She has evidence of significant      fluid overload.  We will start her on Demadex 20 mg b.i.d. and      potassium 20 mg b.i.d.  Hopefully, she will have some good response      to this.  We will also put her on compression stockings if she is      not responding to the Demadex, could consider adding Zaroxolyn 2.5  mg b.i.d.  She will check and follow up her electrolytes with Dr.      Truett Perna.  2. Hypertension.  Blood pressure is dramatically improved from before,      but still not perfect.  We will titrate her medications as needed.      We have to be careful with her Norvasc as this is likely      contributing to her edema.  She may be a candidate for something      like spironolactone, which may help her volume as well as her blood      pressure.  I will see her back in several weeks.     Bevelyn Buckles. Bensimhon, MD  Electronically Signed    DRB/MedQ  DD: 01/26/2008  DT: 01/27/2008  Job #: [id number]   cc:   Leighton Roach. Truett Perna, M.D.

## 2010-12-09 NOTE — Consult Note (Signed)
NAMEJAZARI, [name]        ACCOUNT NO.:  [id number]   MEDICAL RECORD NO.:  [id number]          PATIENT TYPE:  INP   LOCATION:  1342                         FACILITY:  [facility]   PHYSICIAN:  Ines Bloomer, M.D. DATE OF BIRTH:  1928-06-20   DATE OF CONSULTATION:  DATE OF DISCHARGE:                                 CONSULTATION   CHIEF COMPLAINT:  Dyspnea and fever.   HISTORY OF PRESENT ILLNESS:  This is a 77 year old patient who has been  treated for non-Hodgkin lymphoma who is admitted for shortness of  breath, fever, rash and with a generalized feeling poorly and altered  mental status.  She was diagnosed with low-grade non-Hodgkin lymphoma in  2004, and has had multiple lymph nodes.  She was treated with rituximab  in 2005 and had an autoimmune hemolytic anemia.  She has been off  therapy over the last year and a half.  She has developed shortness  breath and she had bilateral pleural effusions, right greater the than  left and some patchy ground-glass disease in the upper lobe.  She also  had a right axillary mass that has increased in size and bulky  retroperitoneal and iliac lymphadenopathy.  She was evaluated by Dr.  Gala Romney from cardiology for congestive heart failure, which was  apparently negative.  She has started back on fludarabine and rituximab  recently, and is in shortness of breath.  CT scan showed no pulmonary  embolus.   She also complains of a frontal headache.   PAST MEDICAL HISTORY:  Significant for herpes zoster, hypercalcemia,  chronic livedo rash, non-Hodgkin lymphoma, positive anti-cardiolipin  antibody, history of ITP.   MEDICATIONS:  1. Acyclovir.  2. Advair.  3. Quinine   ALLERGIES:  She has no allergies.   FAMILY HISTORY:  Noncontributory.   SOCIAL HISTORY:  Does not smoke or drink.  Lives with her husband.   REVIEW OF SYSTEMS:  RESPIRATORY:  She has shortness of breath on minimal  exertion.  GI:  No nausea, vomiting, constipation,  or diarrhea.  SKIN:  She has bluish rash on the trunk.  NEUROLOGICAL:  She complains of left frontal headache.  Neurologically,  she has apparently been very sleepy the last several days.  EENT:  No changes in her eyes or head.  HEMATOLOGIC:  See history of present illness and past medical history.  PSYCHIATRIC:  No psychiatric illnesses.   PHYSICAL EXAMINATION:  VITAL SIGNS:  Blood pressure 130/80, pulse 90,  temperature 99.5, sat 92%.  HEAD, EYES, EAR, NOSE, AND THROAT:  Unremarkable.  LUNGS:  There is decreased breath sound in both bases, particularly  right lower lobe.  CARDIAC:  Regular sinus rhythm, no murmurs.  ABDOMEN:  Soft. No hepatosplenomegaly.  EXTREMITY:  There is 1+ edema and 1+ pulses.  NEUROLOGICAL:  She is oriented x3.  Sensory and motor intact.  SKIN:  There is a rash on her upper chest and back.   IMPRESSION:  1. Non-Hodgkin lymphoma.  2. Dyspnea.  3. Fever and chills.  4. New right pleural effusion.   PLAN:  Thoracentesis and analysis.      Ines Bloomer, M.D.  Electronically Signed     DPB/MEDQ  D:  12/27/2007  T:  12/28/2007  Job:  [id number]

## 2010-12-09 NOTE — Assessment & Plan Note (Signed)
[name]                            CARDIOLOGY OFFICE NOTE   NAME:Mathews, [name]                 MRN:          [id number]  DATE:03/08/2008                            DOB:          1927/12/28    PRIMARY CARE PHYSICIAN:  [name] Roach. Truett Perna, MD   HISTORY:  [name] is 77 year old woman with a history of  recurrent non-Hodgkin lymphoma with retroperitoneal lymphadenopathy  associated with left hydronephrosis as well as severe hypertension,  renal insufficiency, and severe lower extremity edema.   We have been titrating her diuretics and her antihypertensive regimen.  She is finally doing quite well.  Her edema has resolved.  She is  feeling much much better.  She is able now to walk with her husband up  to 30 minutes at a time and feels back to her baseline.  She is not  having any orthopnea.  No PND.   REVIEW OF SYSTEMS:  Negative except for HPI and problem list.   CURRENT MEDICATIONS:  Prednisone as part of her chemotherapy regimen.  She also recently Rituxan, Vincristine, and cyclophosphamide.  Other  medications are spironolactone 25 a day, Advair, Demadex 20 b.i.d.,  acyclovir 200 b.i.d., multivitamin, and Os-Cal.   PHYSICAL EXAMINATION:  GENERAL:  She looks much better.  She is vigorous  and ambulates around the clinic without any respiratory distress.  VITAL SIGNS:  Blood pressure was initially 142/60, on manual recheck  160/72, heart rate 90, weight 127 which is down 17 pounds from last  month and 4 pounds from we saw her just 3 weeks ago.  HEENT:  Normal.  NECK:  Supple.  There is no JVD.  Carotids are 2+ bilaterally without  any bruits.  Others no lymphadenopathy or thyromegaly.  CARDIAC:  She  has a regular rate and rhythm with a soft systolic ejection murmur at  right sternal border.  S2 is well preserved.  There is also 2/6 systolic  ejection murmur at the right sternal border.  LUNGS:  Clear.  ABDOMEN:  Soft, nontender,  and nondistended.  No hepatosplenomegaly.  No  bruits.  No masses.  Good bowel sounds.  EXTREMITIES:  Warm with no  cyanosis, clubbing, or edema.  NEUROLOGIC:  Alert and oriented x3.  Cranial nerves II through XII are  intact.  Moves all fours extremities without difficulty.  Affect is  pleasant.   ASSESSMENT AND PLAN:  1. Volume overload.  This is much improved.  Her creatinine is up just      a bit from 1.26 to 1.5.  We will need to watch closely for volume      depletion.  We may need to back down on her Demadex a bit.  We will      recheck her labs today, if her creatinine continues to go up, we      will decrease her Demadex once a day.  2. Hypertension.  Blood pressure is elevated.  We will start Coreg      3.125 b.i.d.  She had significant amount of swelling on Norvasc.  3. Aortic stenosis is very mild.  Continue surveillance  echocardiograms.  4. Lymphoma as per Dr. Truett Mathews.   DISPOSITION:  We will see her back in several months for followup.     Bevelyn Buckles. Bensimhon, MD  Electronically Signed    DRB/MedQ  DD: 03/08/2008  DT: 03/09/2008  Job #: [id number]   cc:   [name], M.D.

## 2010-12-09 NOTE — H&P (Signed)
NAMEGISSELL, [name]        ACCOUNT NO.:  [id number]   MEDICAL RECORD NO.:  [id number]          PATIENT TYPE:  INP   LOCATION:  1339                         [facility]ACILITY:  [facility]   PHYSICIAN:  Leighton Roach. Truett Perna, M.D. DATE OF BIRTH:  12/14/1927   DATE OF ADMISSION:  01/23/2008  DATE OF DISCHARGE:                              HISTORY & PHYSICAL   PATIENT IDENTIFICATION:  [name] is a 77 year old with a history  of non-Hodgkin's lymphoma.  She is now admitted with a headache, severe  hypertension, and an elevated creatinine.   HISTORY OF PRESENT ILLNESS:  [name] was diagnosed with a low  grade non-Hodgkin's lymphoma in 2004.  She had a low grade lymphoma  involving multiple lymph nodes and the lungs.   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  She was again  treated with Rituximab in November 2007 when she developed progressive  anemia, malaise, and weight loss.  She was maintained off of specific  therapy until May 2009 when she developed increased shortness of breath  and a restaging CT confirmed new bilateral pleural effusions, patchy  ground glass air space disease within the upper lobes, a right axillary  soft tissue mass (increased from November 2008), and bulky  retroperitoneal adenopathy.  She was treated with a first cycle of  fludarabine/rituximab therapy on May 26.   She was admitted on June 1 with fever, chills, shortness of breath, and  malaise.  She also had a rash felt to be related to allopurinol.   Her performance status improved while hospitalized.  She was transfused  with packed red blood cells after the hemoglobin returned at 8.9 on June  22.  She reports persistent malaise following the red cell transfusion.  She presented to the office today for a routine appointment followup  visit.   She complains of a consistent headache and malaise.   She denies  fever.   She was noted to have a markedly elevated blood pressure in the office.  The blood pressure improved partially after the clonidine tablet.  She  is now admitted for further evaluation of the severe hypertension.   PAST MEDICAL HISTORY:  1. History of non-Hodgkin's lymphoma as per HPI.  2. History of ITP.  3. History of herpes zoster, maintained on prophylactic acyclovir.  4. History of a chronic livedo rash.  5. History of hypercalcemia.  6. History positive anticardiolipin antibody.   CURRENT MEDICATIONS:  1. Ambien 5 mg nightly p.r.n.  2. Percocet 2.5 mg q.4 h p.r.n.  3. Calcium plus vitamin D b.i.d.  4. Acyclovir 2 mg b.i.d.  5. Advair discus 1 inhalation b.i.d.  6. Multivitamin daily.  7. Quinine 3 tablets nightly p.r.n.   ALLERGIES:  NO KNOWN DRUG ALLERGIES.   FAMILY HISTORY:  Noncontributory.   SOCIAL HISTORY:  She lives with husband in [place].  She does not use  tobacco or alcohol.   REVIEW OF SYSTEMS:  CONSTITUTIONAL - She denies fever.  She feels cold  .  RESPIRATORY - She has exertional dyspnea.  CARDIAC - Negative.  GU -  Negative.  GI - Negative. NEUROLOGIC - She has been lethargic and  somnolent.  No focal symptoms.  No change in visual acuity.  She  complains of a headache.   PHYSICAL EXAMINATION:  Pressure 228/100 (repeat 236/112), pulse 94,  temperature 97.7.  HEENT:  No thrush.  The fundi are without evidence of papilledema.  NECK: The neck is supple.  LUNGS: Clear with a slight decrease in breath sounds at the right lower  chest.  CARDIAC:  Regular rhythm.  ABDOMEN: No hepatosplenomegaly.  No mass.  EXTREMITIES:  1-2+ pitting edema below the knee bilaterally.  NEUROLOGIC:  She is alert and oriented.  The motor exam appears grossly  intact.  Finger-to-nose testing is normal.  I did not test gait.   LABORATORY DATA:  Hemoglobin 12.1, platelets 114,000, white count 4.7,  ANC 3.8, BUN 19, creatinine 1.29.  Sodium 140, potassium 3.9,  calcium  9.2.   IMPRESSION AND PLAN:  1. Non-Hodgkin's lymphoma, status post one cycle of salvage therapy      consisting of fludarabine/rituximab on Dec 20, 2007.  2. History of dyspnea secondary to chronic obstructive pulmonary      disease and a right pleural effusion, improved; but she has      persistent exertional dyspnea.  3. Admission with a fever of unclear etiology on December 26, 2007 -      resolved.  4. History of ITP.  5. History of herpes zoster, maintained on prophylactic acyclovir.  6. Right pleural effusion, status post a thoracentesis on December 27, 2007      with the cytology consistent with involvement by non-Hodgkin's      lymphoma.  7. Anemia secondary to non-Hodgkin's lymphoma, chemotherapy, and a      history of autoimmune hemolytic anemia.  She is status post a red      cell transfusion on June 23.  8. Bilateral leg edema -?  Related to renal insufficiency or      abdominal/pelvic lymphadenopathy.  9. Left hydronephrosis - She is noted to have a moderate degree of      hydronephrosis on a CT today.  10.Severe hypertension -?  Related to renal obstruction or another      cause.   [name] complains of a headache.  This may be related to this  severe hypertension.  She will be admitted for control of the severe  hypertension and an expedited diagnostic evaluation.  I will ask urology  to evaluate the hydronephrosis.  She may benefit from a retrograde study  of the left ureter and placement of a ureter stent.   We will consult nephrology for management of hypertension as needed.   [name] is due for a second cycle of fludarabine/rituximab.  We  will place further chemotherapy on hold until the hypertension improves.  A restaging noncontrast CT of the abdomen and pelvis today confirms an  improvement in the bulky retroperitoneal and iliac adenopathy.      Leighton Roach Truett Perna, M.D.  Electronically Signed     GBS/MEDQ  D:  01/23/2008  T:  01/23/2008   Job:  [id number]   cc:   Holley Bouche, M.D.  Fax: [phone]   Bevelyn Buckles. Bensimhon, MD  [address] [place] [address]

## 2010-12-09 NOTE — Discharge Summary (Signed)
[name], [name]        ACCOUNT NO.:  [id number]   MEDICAL RECORD NO.:  [id number]          PATIENT TYPE:  INP   LOCATION:  1339                         FACILITY:  [facility]   PHYSICIAN:  Leighton Roach. Truett Perna, M.D. DATE OF BIRTH:  October 16, 1927   DATE OF ADMISSION:  01/23/2008  DATE OF DISCHARGE:  01/25/2008                               DISCHARGE SUMMARY   CONDITION AT DISCHARGE:  Improved.   DISCHARGE DIAGNOSIS:  1. Admission with severe hypertension and a headache, improved at      discharge.  2. Non-Hodgkin's lymphoma, status post fludarabine/rituximab therapy      initiated on Dec 20, 2007.  3. Left hydronephrosis confirmed on an ultrasound during this hospital      admission, likely related to obstructing retroperitoneal/pelvic      lymphadenopathy.  4. Mild renal insufficiency.  5. History of idiopathic thrombocytopenic purpura.  6. History of Herpes zoster, maintained on prophylactic acyclovir.  7. History of hypercalcemia.  8. History of a positive anticardiolipin antibody.  9. Chronic libido rash.   HOSPITAL CONSULTATIONS:  None.   HOSPITAL PROCEDURES:  Renal ultrasound.   HOSPITAL COURSE:  [name] is a 77 year old with a history of non-  Hodgkin's lymphoma.  She was treated with a first cycle of salvage  chemotherapy consisting of fludarabine and rituximab on May 26.   She presented to the office for a routine follow-up visit on June 29 and  complained of a headache.  She was noted to have severe systolic and  diastolic hypertension.  She was treated with clonidine in the office  and despite the clonidine the blood pressure remained elevated.  She was  admitted for further evaluation.   A CT of the abdomen and pelvis on the day of admission confirmed left  hydronephrosis.  The retroperitoneal mass and pelvic lymphadenopathy  have improved.   She was placed on Norvasc for blood pressure control.  The systolic  blood pressure ranged generally between 170  and 180 throughout the  remainder of this admission with diastolic blood pressure readings in  the 80s and 90s.  The headache resolved.   A renal ultrasound on June 30 confirmed moderate left hydronephrosis.   I discussed the case with urology.  They felt a ureter stent was not  indicated due to the mild elevation in the creatinine.  They felt it was  unlikely the hypertension was related to hydronephrosis.   She will continue Norvasc at discharge.  I will consult cardiology  regarding ongoing management of hypertension in the setting of mild  renal insufficiency and lower extremity edema.   She appears stable for discharge on the morning of July 1.   DISCHARGE MEDICATIONS:  1. Acyclovir 200 mg b.i.d.  2. Advair one inhalation b.i.d.  3. Calcium plus vitamin D b.i.d.  4. Norvasc 5 mg daily.   She is to call for shortness of breath.  She will follow up as scheduled  with Dr. Truett Perna on July 6.      Leighton Roach Truett Perna, M.D.  Electronically Signed     GBS/MEDQ  D:  01/25/2008  T:  01/25/2008  Job:  [id number]   cc:   Holley Bouche, M.D.  Fax: [phone]   Bevelyn Buckles. Bensimhon, MD  [address] [place] [address]

## 2010-12-09 NOTE — H&P (Signed)
[name], [name]        ACCOUNT NO.:  [id number]   MEDICAL RECORD NO.:  [id number]          PATIENT TYPE:  INP   LOCATION:  1342                         FACILITY:  [facility]   PHYSICIAN:  Leighton Roach. Truett Perna, M.D. DATE OF BIRTH:  Dec 19, 1927   DATE OF ADMISSION:  12/26/2007  DATE OF DISCHARGE:                              HISTORY & PHYSICAL   PATIENT IDENTIFICATION:  [name] is a 77 year old with a history  of non-Hodgkin's lymphoma.  She is now admitted with shortness of  breath, fever, a skin rash, and an altered mental status.   HISTORY OF PRESENT ILLNESS:  [name] was diagnosed with a low-  grade non-Hodgkin's lymphoma initially in 2004.  She has a history of a  low-grade lymphoma involving multiple lymph nodes and the lungs.   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   She was again treated with Rituximab in November of 2007 when she  developed progressive anemia, malaise, and weight loss.   She has been observed off of specific therapy for the past 1-1/2 years.   [name] recently developed increased shortness of breath.  The  hemoglobin has remained stable.  She underwent a restaging CT evaluation  on Dec 13, 2007.  This confirmed new bilateral pleural effusions on the  right greater than left, patchy ground glass air space disease within  the upper lobes, a right axillary soft tissue mass that has increased in  size compared to November of 2008, and bulky retroperitoneal/iliac  adenopathy.   The shortness of breath progressed last week.  She was evaluated by Dr.  Gala Romney due to a concern of congestive heart failure.  The cardiology  evaluation was unremarkable.   We decided to proceed with systemic therapy consisting of  fludarabine/Rituximab.  The first cycle of fludarabine/Rituximab was  initiated on Dec 20, 2007.   She developed increased shortness of breath  on Dec 22, 2007, and a CT of  the chest was negative for a pulmonary embolism.   Over the past few days, she has developed an intermittent fever, chills,  malaise, progressive shortness of breath, and a skin rash.  Allopurinol  was discontinued on Dec 23, 2007.   [name] has been somnolent for the past few days.  She complains  of a left frontal headache and a left-sided ear ache.   PAST MEDICAL HISTORY:  1. History of herpes zoster, maintained on prophylactic acyclovir.  2. History of a chronic livedo rash.  3. History of hypercalcemia.  4. Non-Hodgkin's lymphoma, as per HPI.  5. History of a positive anticardiolipin antibody.  6. History of ITP.   CURRENT MEDICATIONS:  1. Quinine nightly p.r.n.  2. Acyclovir 200 mg b.i.d.  3. Advair 1 inhalation b.i.d.  4. Calcium plus vitamin D b.i.d.  5. Multivitamin daily.   ALLERGIES:  NO KNOWN DRUG ALLERGIES.   FAMILY HISTORY:  Noncontributory.   SOCIAL HISTORY:  She lives with her husband in [place].   REVIEW OF SYSTEMS:  CONSTITUTIONAL:  She reports a fever and chills for  the past few  days.  RESPIRATORY:  She has shortness of breath with  minimal exertion.  CARDIAC:  Negative.  GU:  Negative.  GI:  Negative.  SKIN:  She has developed a confluent erythematous rash at the trunk for  the past 1 week.  HEENT:  She complains of pain at the left frontal area  and at the left ear.  NEUROLOGIC:  She has been somnolent for the past  few days.  No focal symptoms.   PHYSICAL EXAMINATION:  VITAL SIGNS:  Blood pressure 148/68, pulse 85,  temperature 99.5.  HEENT:  There is a small amount of thrush at the right buccal mucosa and  over the tongue.  Bilateral external canals without evidence of  infection.  The left tympanic membrane appears perforated.  LUNGS:  Diminished breath sounds with inspiratory rhonchi throughout the  right lower chest.  CARDIAC:  Regular rhythm.  Tachycardia.  ABDOMEN:  Nontender.  No hepatosplenomegaly.   No mass.  EXTREMITIES:  1-2+ edema below the knee bilaterally.  NEUROLOGIC:  She is alert and oriented.  Motor exam appears grossly  intact.  The gait is normal.  SKIN:  There is a confluent erythematous rash at the upper chest greater  than the back.   IMPRESSION AND PLAN:  1. Low grade non-Hodgkin's lymphoma with recent clinical/radiologic      progression, status post cycle #1 of fludarabine/Rituximab      initiated on Dec 22, 2007.  2. History of anemia secondary to non-Hodgkin's lymphoma and cold      agglutinin disease.  3. History of idiopathic thrombocytopenic purpura.  4. History of herpes zoster.  5. History of a positive anticardiolipin antibody.  6. History of hypercalcemia.  7. Shortness of breath, most likely related to a large right pleural      effusion documented on repeat CT scan over the past few weeks.  8. Left hydronephrosis on a CT Dec 12, 2007, no significant      hydronephrosis was seen on a renal ultrasound.  9. Skin rash, potentially related to allopurinol or chemotherapy.  10.Fever/chills, ? related to non-Hodgkin's lymphoma or infection.  11.Somnolence, ? etiology.  We will obtain a chemistry panel today.   [name] appears systemically ill.  She will be admitted for  further diagnostic evaluation.  We will ask Dr. Edwyna Shell to see her for  consideration of a right VATS or PleurX catheter.   The fever may be related to the non-Hodgkin's lymphoma, an infection, or  less likely tumor lysis syndrome.  We will obtain a chemistry panel,  urinalysis, and cultures on hospital admission.      Leighton Roach Truett Perna, M.D.  Electronically Signed     GBS/MEDQ  D:  12/26/2007  T:  12/26/2007  Job:  [id number]

## 2010-12-09 NOTE — Assessment & Plan Note (Signed)
HEALTHCARE                            CARDIOLOGY OFFICE NOTE   NAME:[name], [name]                 MRN:          [id number]  DATE:02/23/2008                            DOB:          June 29, 1928    ONCOLOGIST:  Leighton Roach. Truett Perna, MD.   INTERVAL HISTORY:  [name] is a 77 year old woman with a history  of recurrent non-Hodgkin lymphoma with retroperitoneal lymphadenopathy  associated with left hydronephrosis as well as severe hypertension and  renal insufficiency.  We have been following her for severe lower  extremity edema.   When we last saw her on January 26, 2008, at that time, she had significant  lower extremity edema.  We started her on Demadex and also put her on  support hose.  She comes today for followup.  Her weight is down 13  pounds.  She said the stockings and the Demadex helped a lot.  She said  the stockings are now way too big for her.  Unfortunately, she is still  having some lower extremity edema, this is much better.  She denies any  orthopnea or PND.  Her main complaint is just extreme fatigue.  She has  been out of her Norvasc now for about a week.   CURRENT MEDICATIONS:  Os-Cal, multivitamin, acyclovir 200 b.i.d.,  Norvasc 5 mg a day (out x1 week), Demadex 20 b.i.d., and Advair b.i.d.   PHYSICAL EXAMINATION:  GENERAL:  She is in no acute distress. Ambulates  around the clinic without respiratory difficulty.  VITAL SIGNS:  Blood pressure is 162/78; heart rate is 85; and weight is  131, which is down from 144.  HEENT:  Normal.  NECK:  Supple.  JVP is about 7 cm of water.  Carotids are 2+ bilaterally  without bruits.  There is no lymphadenopathy or thyromegaly.  CARDIAC:  She has a regular rate and rhythm with 2/6 systolic ejection  murmur at the left sternal border as well as a 2/6 systolic ejection  murmur at the right sternal border.  No gallop.  LUNGS:  Clear.  ABDOMEN:  Soft, nontender, and nondistended.  No  hepatosplenomegaly.  No  bruits.  No masses.  Good bowel sounds.  EXTREMITIES:  Warm.  She has  about 2+ edema from the mid calf down, this has much improved.  NEURO:  Alert and oriented x3.  Cranial nerves II-XII are intact.  Moves  all 4 extremities without difficulty.  Affect is pleasant.   ASSESSMENT AND PLAN:  1. Lower extremity edema, this is much better.  We will continue her      on the Demadex.  She has stopped her Norvasc, which may have also      been contributing to her lower extremity edema.  At this point,      given her hypertension and residual lower extremity edema, we will      start her on spironolactone 25 a day.  We will watch her potassium      very closely.  We will check it today as well as on Monday.  2. Hypertension, blood pressure has improved.  She is  now off her      Norvasc, which I think is a good thing given her lower extremity      edema and problems with constipation.  Hopefully, the      spironolactone will help keep it in check.  We may also consider      adding an ACE inhibitor or an ARB as needed.  She will get a blood      pressure check next week.  3. Lymphoma, as per Dr. Truett Perna.   DISPOSITION:  We will see her back in about a month for followup.  We  will have her replace her stockings as well.     Bevelyn Buckles. Bensimhon, MD  Electronically Signed    DRB/MedQ  DD: 02/23/2008  DT: 02/23/2008  Job #: [id number]   cc:   Leighton Roach. Truett Perna, M.D.

## 2010-12-09 NOTE — Discharge Summary (Signed)
NAMEJERRIS, [name]        ACCOUNT NO.:  [id number]   MEDICAL RECORD NO.:  [id number]          PATIENT TYPE:  INP   LOCATION:  1342                         [facility]AC[facility]   PHYSICIAN:  [name], M.D. DATE OF BIRTH:  [birth year]   DATE OF ADMISSION:  12/26/2007  DATE OF DISCHARGE:  12/30/2007                               DISCHARGE SUMMARY   CONDITION AT DISCHARGE:  Improved.   DISCHARGE DIAGNOSES:  1. Non-Hodgkin's lymphoma status post fludarabine/Rituxan day 11.  2. Dyspnea, likely multifactorial secondary to chronic obstructive      pulmonary disease, right pleural effusion and anemia, improved at      discharge.  3. Fever, question etiology, likely secondary to non-Hodgkin's      lymphoma, drug reaction versus tumor lysis syndrome.  4. E. coli urinary tract infection to complete a 3 day course of      Bactrim as an outpatient.  5. History of (idiopathic thrombocytopenic purpura).  6. History of herpes zoster maintained on prophylactic acyclovir.  7. Right pleural effusion status post thoracentesis December 27, 2007, with      cytology consistent with involvement by non-Hodgkin's lymphoma.  8. Anemia secondary to non-Hodgkin's lymphoma/chemotherapy, history of      autoimmune hemolytic anemia.   CONSULTATIONS:  Dr. Karle Mathews.   PROCEDURES:  Right-sided thoracentesis December 27, 2007, by interventional  radiology.   HISTORY OF PRESENT ILLNESS:  [name] is a 77 year old woman with  non-Hodgkin's lymphoma.  She was initially diagnosed with a low-grade  non-Hodgkin's lymphoma in 2004.  She has a history of low-grade lymphoma  involving multiple lymph nodes and the lungs.  She was treated with  Rituximab in late 2005 after developing an autoimmune hemolytic anemia  related to a cold agglutinin.  She was again treated with Rituximab in  November of 2007 when she develop progressive anemia, malaise and weight  loss.  She had been observed off of specific therapy  for the past 1-1/2  years.   [name] recently developed increased shortness of breath.  Her  hemoglobin has remained stable.  Restaging CT evaluation Dec 13, 2007,  confirmed new bilateral pleural effusions right greater than left,  patchy ground glass air space disease within the upper lobes, a right  axillary soft tissue mass that had increased in size compared to  November of 2008 and bulky retroperitoneal/iliac adenopathy.   Over the past 1 week prior to admission the shortness of breath had  worsened.  She was evaluated by Dr. Gala Mathews due to concern for  congestive heart failure.  Cardiology evaluation was unremarkable.   She began treatment with fludarabine/Rituxan beginning Dec 20, 2007.  On  Dec 22, 2007, she developed increased shortness of breath.  Chest CT was  negative for a pulmonary embolism.  Over the past several days prior to  admission she developed intermittent fever, chills, malaise, progressive  dyspnea and a skin rash.  When evaluated in the office the day of  admission it was also noted that [name] had been somnolent for  several days and she was complaining of a left frontal headache and left-  sided  earache.  She was subsequently admitted for further evaluation.   Mathews COURSE:  [name] was admitted to oncology unit at [name] on December 26, 2007.  Admission lab work showed hemoglobin  9.6, white count 2.0, absolute neutrophil count 1.6, platelet count  83,000, sodium 132, potassium 4.1, BUN 19, creatinine 1.01, bilirubin  0.8, alkaline phosphatase 191, SGOT 43, SGPT 31, total protein 5.5,  albumin 2.3, calcium 7.9, uric acid 5.0, LDH 272.  A urinalysis was  essentially unremarkable.  Two sets of blood cultures and a urine  culture were obtained.   Her temperature spiked to 102.5 degrees the night of admission.  She had  no further temperature elevations.  Both blood cultures remained  sterile.  The urine culture returned  positive for E. coli.  She was  started on a course of Cipro.  The sensitivities returned several days  later showing that the E. coli was resistant to Cipro.  She will be  discharged home on Septra DS 1 tablet twice daily for 3 days.   [name] was evaluated by Dr. Edwyna Mathews for possible placement of a  PleurX catheter.  Dr. Edwyna Mathews recommended proceeding with a  thoracentesis.  On December 27, 2007, [name] underwent a right-sided  thoracentesis procedure by interventional radiology with 50 mL of  pleural fluid removed.  The chest x-ray following the procedure showed  the right pleural effusion was slightly smaller.  There was no evidence  of a pneumothorax post thoracentesis.  Cytology on the pleural fluid  showed abundant lymphocytes admixed with reactive mesothelial cells  which Dr. Truett Mathews felt was consistent with involvement by non-Hodgkin's  lymphoma.  Dr. Edwyna Mathews did not feel that placement of a pleuritic  catheter would help her dyspnea.   [name]'s dyspnea slowly improved during the hospitalization.  Dr. Truett Mathews felt that the dyspnea was likely multifactorial secondary  to a combination of COPD, the right pleural effusion and anemia.  At  discharge she was ambulating in the hallway without difficulty.   On admission [name] reported a left frontal headache and left-  sided earache.  CT of the sinuses on December 27, 2007, showed no evidence  for inflammatory disease.  [name]'s headache improved.  She  continued to have pain at the left ear that had also improved by  discharge.   [name] was felt to be stable for discharge home December 30, 2007.  She will be scheduled for outpatient followup next week.   LABORATORY DATA:  December 30, 2007, LDH 262, sodium 140, potassium 4.0, BUN  7, creatinine 0.75, bilirubin 0.6, alkaline phosphatase 129, SGOT 33,  SGPT 22, total protein 4.8, albumin 2.0, calcium 7.7, hemoglobin 8.6,  white count 2.9, absolute  neutrophil count 1.7, platelet count 118,000.   DISCHARGE DISPOSITION:  1. Condition - improved.  2. Activity - increase activity slowly.  3. Diet - no restrictions.  4. Wound care - not applicable.  5. Special instructions - call with increased shortness of breath,      fever of greater than or equal to 101 degrees or any other      problems.  6. Followup - with Dr. Truett Mathews January 04, 2008, (office will call with      appointment time).   DISCHARGE MEDICATIONS:  1. Acyclovir 200 mg twice daily.  2. Advair 1 inhalation twice daily.  3. Calcium plus vitamin D twice daily.  4. Multivitamin daily.  5. Septra DS 1 tablet twice daily  for 3 days.      Lonna Cobb, N.P.      [name], M.D.  Electronically Signed    LT/MEDQ  D:  12/30/2007  T:  12/30/2007  Job:  [id number]   cc:   Ines Bloomer, M.D.  [address]  [place]  [place] [address]

## 2010-12-09 NOTE — Op Note (Signed)
[name], [name]        ACCOUNT NO.:  [id number]   MEDICAL RECORD NO.:  [id number]          PATIENT TYPE:  AMB   LOCATION:  SDS                          FACILITY:  [facility]   PHYSICIAN:  Carolan Shiver, M.D.    DATE OF BIRTH:  1928/05/30   DATE OF PROCEDURE:  DATE OF DISCHARGE:  02/22/2009                               OPERATIVE REPORT   JUSTIFICATION FOR PROCEDURE:  [name] is a 77 year old  white female here today for a BAHA osseointegrated implant, right  temporal bone to try to improve her hearing.  The patient has had a  lifelong history of chronic right ear disease.  She underwent a right  modified radical mastoidectomy with an incus transposition for right  middle ear and attic cholesteatoma on January 06, 1996.  She underwent a  revision tympanoplasty with a Nanetta Batty, ossiculoplasty on October 31, 1998, but this failed.  She was left with a moderately severe mixed  hearing loss of her right ear.  Audiometric testing on February 19, 2009,  documented an SRT of 85 dB with 88% discrimination at 100 dB HTL.  She  had a moderate sensorineural hearing loss in the left ear with an SRT of  35 dB and 92% discrimination.  She was wearing an in-the-ear hearing aid  on the left, but is still struggling to hear.  Because of this, she was  tried with a Cordelle BAHA headband.  She liked the sound quality and  was recommended for a BAHA implant of her right temporal bone and will  be fit with an intensive processor.  Risks, complications, and  alternatives of the procedure were explained to her.  Questions were  invited and answered and informed consent was signed and witnessed.   The patient is known to have non-Hodgkin lymphoma.  She is status post 2  cycles of a chemotherapy.  She is currently in remission and she was  cleared for the procedure by her oncologist, Dr. Alan Ripper.   JUSTIFICATION FOR INPATIENT SETTING:  The patient's age, need for  general  endotracheal anesthesia.   JUSTIFICATION FOR OVERNIGHT STAY:  Not applicable.   PREOPERATIVE DIAGNOSES:  1. Moderately severe mixed hearing loss, right ear, status post right      modified radical mastoidectomy in 1997.  2. Non-Hodgkin lymphoma in remission.   POSTOPERATIVE DIAGNOSES:  1. Moderately severe mixed hearing loss, right ear, status post right      modified radical mastoidectomy in 1997.  2. Non-Hodgkin lymphoma in remission.   OPERATION:  BAHA osseointegrated implant, right temporal bone.   SURGEON:  Carolan Shiver, MD   ANESTHESIA:  General endotracheal, Dr.  Hart Robinsons.   COMPLICATIONS:  None.   DISCHARGE STATUS:  Stable.   SUMMARY OF REPORT:  After the patient was taken to the operating room,  she was placed in the supine position.  An IV had been begun in the  holding area.  General IV induction was then performed under the  guidance of Dr. Hart Robinsons.  The patient was then orally  intubated.  Eyelids were taped and shut.  She was properly  positioned  and monitored.  Elbows and ankles were padded with foam rubber and a  time-out was performed.   The patient was then turned 90 degrees.  A small amount of hair was  clipped in the right postauricular area.  Hair was taped and a stocking  cap was applied.  Skin was cleansed with chlorhexidine.  A time-out was  then performed again.  A BAHA template was used to mark a skin graft  site in the right temporoparietal area, 55 mm from the ear canal.  The  area was then infiltrated with 6 mL of 1% Xylocaine with 1:100,000  epinephrine.  The patient's right postauricular area and the ear were  prepped with Betadine and draped in a standard fashion for a BAHA  implant.   Using a BAHA dermatome, an inferiorly based skin graft was elevated.  Soft tissue was then elevated as an inferiorly based flap.  This was  dissected down to the mucoperiosteum.  It previously tattooed a spot for  the flange fixture.  A  cruciate incision was then made in the  mucoperiosteum and 4 flaps were elevated with a Raspitorium.  A BAHA  guide hole 3 mm deep was then drilled.  Solid bone was encountered.  Drill guide collar was removed and the hole was drilled to 4 mm depth.  Again, there was good bone present.  A 4-mm countersink bur was then  used, 2000 RPMs to drill the hole for this flange fixture.  Continuous  suction irrigation was used during this process and great care was taken  not to hit the bone.  The area was then irrigated with bacitracin-  containing saline.  The inferiorly based soft tissue flap was then  removed.  The fat right surrounding the site for 360 degrees was then  undermined and excised.  The skin graft was then punched with a 4-mm  punch over the flange fixture site.   A 4-mm flange fixture was then mounted on the BAHA drill.  The flange  fixture was then inserted into the hole in the bone using 40 cm of  torque.  The skin graft was then positioned over the flange fixture and  the skin graft was then sutured to the donor site using interrupted 4-0  Vicryls at the corners and running 3-0 Monocryl along the 3 sides.  The  skin graft was then pie-crusted and quilted to the mucoperiosteum using  interrupted 3-0 Monocryls.  Bacitracin ointment was applied and Adaptic  gauze layer was then placed and a surgical sponge cut to fit the donor  site was placed over the flange fixture and held in place with a healing  cap.  Bacitracin ointment was applied around the sponge.  The ear was  padded with Telfa cotton and a standard adult Glasscock mastoid dressing  was applied loosely in the standard fashion.  The patient was then  awakened, extubated, and transferred to hospital bed.  She appeared to  tolerate the general endotracheal anesthesia and the procedures well and  left the operating room in stable condition.   Total fluids 1 liter.  The patient received Ancef 1 g IV, Zofran 4 mg IV  at the  beginning and end of procedure and Decadron 10 mg IV.   [name] will be discharged today as an outpatient with her  husband.  He will be instructed to return her to my office on February 28, 2009, at 1:50 p.m.   DISCHARGE MEDICATIONS:  1.  Cefzil 500 mg p.o. b.i.d. x10 days with food, #20 tabs.  2. Percocet 5/325, #30 tabs, 1 p.o. q.4 h. p.r.n. pain.   She is to keep her head elevated on 2 pillows for the next 3 evenings,  avoid water exposure of the skin graft site, and follow a regular diet.  She is to call [phone] for any postoperative problems.  She will be  given both verbal and written instructions.   In approximately 6 weeks, once the skin graft site is healed and the  flange fixture has osseointegrated, the patient will be fit with an  intensive sound processor.      Carolan Shiver, M.D.  Electronically Signed     EMK/MEDQ  D:  02/22/2009  T:  02/22/2009  Job:  [id number]   cc:   Leighton Roach. Truett Perna, M.D.  Melida Quitter, M.D.  Bevelyn Buckles. Bensimhon, MD  Genene Churn. Love, M.D.  Claude Manges. Cleophas Dunker, M.D.

## 2010-12-12 NOTE — Discharge Summary (Signed)
[name], [name]                    ACCOUNT NO.:  [id number]   MEDICAL RECORD NO.:  [id number]                   PATIENT TYPE:  INP   LOCATION:  3312                                 FACILITY:  [facility]   PHYSICIAN:  Ines Bloomer, M.D.              DATE OF BIRTH:  1927/11/07   DATE OF ADMISSION:  02/05/2004  DATE OF DISCHARGE:  02/09/2004                                 DISCHARGE SUMMARY   PRIMARY PHYSICIAN:  Dr. Neva Seat.   ONCOLOGIST:  Dr. Jillyn Hidden B. Sherrill.   ADMISSION DIAGNOSIS:  Multiple bilateral lung nodules with history of low-  grade lymphoma.   DISCHARGE DIAGNOSES/SECONDARY DIAGNOSES:  1. Multiple bilateral lung nodules with history of low-grade lymphoma,     followed by [name], status post right lower lobe and right     middle lobe wedge resection, final pathology still pending.  2. Chronic obstructive pulmonary disease.  3. Idiopathic thrombocytopenia, which is also followed by Dr. Truett Perna.     Platelet count on February 07, 2004 was 253,000.   BRIEF HISTORY:  [name] is a 77 year old Caucasian female with history  of low-grade lymphoma diagnosed in September of 2004 after an excision of a  right lateral chest wall mass.  She has been followed by Dr. Truett Perna for  this as well as ITP.  On a recent routine followup chest CT scan, she was  found to have multiple lung nodules.  She was referred to Dr. Ines Bloomer at the [facility] office for further evaluation of these nodules.  After  review of her films and exam of the patient, Dr. Edwyna Shell recommended a video-  assisted thorascopic surgery of her right lung to determine the etiology of  her lung nodules.  Surgery was tentatively scheduled for February 05, 2004.  Preoperative pulmonary function tests showed an FVC of 1.58 and FEV-1 of 1,  which was about 50% of normal.   HOSPITAL COURSE:  As anticipated, [name] was electively admitted to  [facility] on February 05, 2004 and did  undergo a right video-assisted  thorascopic surgery, right thoracotomy and wedge resection of her right  middle lobe and right lower lobe lung lesions.  Two chest tubes were  inserted intraoperatively.  Specimens were sent for routine culture, AFB and  fungal testing as well as for pathology.  As of postoperative day 3, her  pathology results were still pending.  [name] had tolerated her  surgery well and after a short stay in the recovery room, was transferred in  stable condition to a stepdown unit.   On postoperative day 1, [name] was doing well.  She was afebrile and  was hemodynamically stable.  She was saturating 92% on 2 L by nasal cannula.  Her chest x-ray did show bibasilar atelectasis.  Her chest tube output was  not excessive.  Her urine output was adequate.  She began ambulating, but  with  some soreness.  It was felt that 1 chest tube could be removed.  Since  she began an oral diet, her IV fluids were decreased.  Her pain was managed  with a morphine PCA pump.   Over the next several days, [name] continued to make progress.  Her  second chest tube was discontinued on postoperative day 2.  Followup chest x-  ray was stable, showing only a tiny right apical pneumothorax of less than  3%.  She did have right basilar atelectasis which was improving.  Her labs  remained stable.  She was weaned from supplemental oxygen and was saturating  90% and above.  On the evening of postoperative day 2, she did spiked a  temperature of 101.5.  This was treated with Tylenol as well as aggressive  pulmonary toilet.  She was also started on prophylactic antibiotics,  specifically a 7-day course of Avelox.  Her Foley catheter was removed and  she was voiding without difficulty.  After her chest tubes were removed, her  PCA was discontinued and her pain was managed successfully on oral  medication.  She was evaluated by the mobility team and was felt stable for  transfers  with supervision only.  Her bowels were functioning appropriately.  On exam, she was in sinus rhythm.  Her heart had a regular rate and rhythm.  Her lung sounds were diminished on the right base but otherwise clear.  Her  right chest incision was clean and dry.  Her abdominal exam was benign and  she has no lower extremity edema.  It was felt that if she continued to  progress in this manner, that she would be ready for discharge home by  postop day 4, February 09, 2004.  Official discharge orders will be pending that  there are no changes in patient's status noted during morning rounds and  that she remains afebrile.   RADIOLOGY:  PA and lateral chest x-ray on February 08, 2004 showed a tiny right  apical pneumothorax estimated at less than 5%, stable right lower lobe  airspace disease and atelectasis, slightly more apparent left upper lobe 2-  cm nodular opacity.   LABORATORY DATA:  Routine intraoperative cultures from her right lung nodule  showed no growth x2 days.  Labs on February 07, 2004 showed WBC of 7.5,  hemoglobin 10.4, hematocrit 29.8, platelet count 253,000; sodium 135,  potassium 4.0, glucose 112, BUN 6, creatinine 0.6, total bilirubin 1.1,  alkaline phosphatase 83, SGOT 25, SGPT 20, total protein 5.4, albumin 2.7  and calcium 8.3.   Final pathology reports from her intraoperative lung biopsies were still  pending as of February 08, 2004.   DISCHARGE MEDICATIONS:  1. Acyclovir 200 mg 1 p.o. b.i.d.  2. Advair Diskus 100/50 mcg 1 puff b.i.d.  3. Multivitamin 1 p.o. daily.  4. Vitamin C, vitamin B, vitamin E and calcium supplements daily as     directed.  5. Avelox 400 mg 1 p.o. daily x5 days.  6. Tylox 1-2 tablets p.o. q.4 h. p.r.n. pain.   ACTIVITY:  She is instructed to driving or heavy lifting.  She is also to  avoid strenuous activity but is encouraged to continue daily walking  exercises.   DIET:  She may resume her pre-hospitalization diet.  WOUND CARE:  She may shower  daily with mild soap and water.  She is to  notify the [facility] office for fever greater than 101 or redness or drainage  from her incision site  or increasing shortness of breath.   FOLLOWUP:  1. She is to follow up with Dr. Edwyna Shell at the [facility]TS office in 7-10 days.  She     will have a chest x-ray 1 hour before     her appointment at the [facility].  She is instructed     to bring her chest x-ray films with her to the CVTS office.  The [facility]     office will notify her of her specific appointment date and times.  2. She is to follow up with Dr. Truett Perna as previously scheduled.      Jerold Coombe, P.A.                  Ines Bloomer, M.D.    AWZ/MEDQ  D:  02/08/2004  T:  02/09/2004  Job:  [id number]   cc:   Ines Bloomer, M.D.  [address]  [place]  [place] [address]   Leighton Roach. Truett Perna, M.D.  [address] [facility]  [place]  [place] [address]  Fax: [phone]   Carolan Shiver.D.

## 2010-12-12 NOTE — Procedures (Signed)
[facility]  Patient:    [name], [name]                 MRN: [id number] Proc. Date: 10/13/00 Adm. Date:  [id number] Attending:  Louie Bun CC:         Arvella Merles, M.D.   Procedure Report  PROCEDURE:  Colonoscopy.  INDICATION FOR PROCEDURE:  Screening colonoscopy in a 77 year old patient with no previous colon imaging.  DESCRIPTION OF PROCEDURE:  The patient was placed in the left lateral decubitus position then placed on the pulse monitor with continuous low flow oxygen delivered by nasal cannula. She was sedated with 25 mg IV Demerol and 5 mg IV Versed. The Olympus video colonoscope was inserted into the rectum and advanced to the cecum, confirmed by transillumination at McBurneys point and visualization of the ileocecal valve and appendiceal orifice. The prep was good. In the base of the cecum, there was a sessile 6 mm polyp that was fulgurated by hot biopsy. The remainder of the cecum, ascending, transverse, descending and sigmoid appeared normal with no further masses, polyps, diverticula or other mucosal abnormalities. The rectum likewise appeared normal and retroflexed view of the anus revealed no obviously enlarged internal hemorrhoids. The colonoscope was then withdrawn and the patient returned to the recovery room in stable condition. The patient tolerated the procedure well and there were no immediate complications.  IMPRESSION:  Small cecal polyp.  PLAN:  Await histology for determination of method and interval for future colon screening. DD:  10/13/00 TD:  10/14/00 Job: [id number]

## 2010-12-12 NOTE — Op Note (Signed)
NAMESHARILYN, GEISINGER                    ACCOUNT NO.:  [id number]   MEDICAL RECORD NO.:  [id number]                   PATIENT TYPE:  INP   LOCATION:  2899                                 FACILITY:  [facility]   PHYSICIAN:  Ines Bloomer, M.D.              DATE OF BIRTH:  Sep 06, 1927   DATE OF PROCEDURE:  DATE OF DISCHARGE:                                 OPERATIVE REPORT   PREOPERATIVE DIAGNOSIS:  Status post chemotherapy for lymphoma, enlarging  multiple pulmonary nodules.   POSTOPERATIVE DIAGNOSIS:  Status post chemotherapy for lymphoma, enlarging  multiple pulmonary nodules.   OPERATION PERFORMED:  Right video assisted thoracoscopic surgery,  thoracotomy, wedge resection of right lower lobe and right middle lobe  lesions.   SURGEON:  Ines Bloomer, M.D.   ASSISTANT:  Jerold Coombe, P.A.   DESCRIPTION OF PROCEDURE:  After percutaneous insertion of all monitoring  lines, the patient underwent general anesthesia.  He was turned to the right  lateral thoracotomy position, prepped and draped in the usual sterile  manner.  A dual lumen tube was inserted. This patient had been treated with  chemotherapy for lymphoma, which was in remission, and was found to have  multiple enlarging nodules in the lung.  He was brought to the operating  room for biopsy.  Two trocar sites were made in the anterior and posterior  axillary line at the seventh intercostal space.  Two trocars were inserted.  There were multiple adhesions in the upper lobe and across the superior  portion of the middle lobe, so a small 3 cm incision was made at the sixth  intercostal space just below this and in order to allow finger palpation of  the lower portion of the middle lobe and right lower lobe.  Several nodules  were palpated in the medial basilar segment of the right lower lobe.  It was  grasped with a Duval lung clamp and then stapled and resected with the EZ-45  stapler.  Then several small   lesions in the medial segment of the right  middle lobe were grasped with a Duval lung clamp and resected with the EZ-45  stapler.  Then two other lesions in the right lower lobe, one in the lateral  basilar segment and one in the posterior basilar segment were grasped with  [facility] lung clamps, stapled and removed with the EZ-45 stapler.  CoSeal was  applied to the staple lines in the usual fashion.  Two chest tubes were  placed through the trocar sites and tied in place with 0 silk.  A Marcaine  block was done in the usual fashion.  The On-Q catheters were placed in the  interspace in the usual fashion.  Chest was closed with #1 Vicryl in the  muscle layer, 2-0 Vicryl in the subcutaneous tissue and Dermabond for the  skin.  The patient was then transferred to the recovery room in stable  condition.  Ines Bloomer, M.D.    DPB/MEDQ  D:  02/05/2004  T:  02/05/2004  Job:  [id number]

## 2010-12-12 NOTE — Op Note (Signed)
   [name], [name]                    ACCOUNT NO.:  [id number]   MEDICAL RECORD NO.:  [id number]                   PATIENT TYPE:  AMB   LOCATION:  DSC                                  FACILITY:  [facility]   PHYSICIAN:  Sandria Bales. Ezzard Standing, M.D.               DATE OF BIRTH:  [birth year]   DATE OF PROCEDURE:  04/17/2003  DATE OF DISCHARGE:                                 OPERATIVE REPORT   PREOPERATIVE DIAGNOSIS:  Right axillary mass.   POSTOPERATIVE DIAGNOSIS:  Right axillary lymphadenopathy.   OPERATION PERFORMED:  Biopsy of right axillary lymph node.   SURGEON:  Sandria Bales. Ezzard Standing, M.D.   ASSISTANT:  None.   ANESTHESIA:  General LMA.   COMPLICATIONS:  None.   INDICATIONS FOR PROCEDURE:  [name] is a 77 year old white female  with a left retroperitoneal mass and right axillary mass which is consistent  with possible lymphoma.  The patient comes for initial biopsy of the right  axillary mass.  Indication and potential complications were explained to the  patient, the potential complications include but not limited to infection,  bleeding, seroma and lack of diagnosis.  She now presents to the operating  room.   DESCRIPTION OF PROCEDURE:  She was given general LMA anesthesia.  I did a  right axillary ultrasound which shows a 1.8 x 1.1 cm mass which has  architecture of a probable lymph node.  Incision made directly over this  mass and I then excised both that mass with some other surrounding lymph  nodes so I probably took out two or three lymph nodes in total. These lymph  nodes were abnormally large and soft.  Hemostasis was controlled with Bovie  electrocautery and 3-0 Vicryl sutures.  Subcutaneous tissue was closed with  3-0 Vicryl sutures.  Skin closed with 5-0 Vicryl, painted with tincture of  benzoin and Steri-Stripped.  I spoke with Dr. Tammi Sou identifying this  as a lymphoma work-up.  Pathology is pending at time of dictation.                           Sandria Bales. Ezzard Standing, M.D.    DHN/MEDQ  D:  04/17/2003  T:  04/17/2003  Job:  [id number]   cc:   Holley Bouche, M.D.  [address]  [place], [place] [address]  Fax: [phone]   Huel Cote, M.D.  [address] [place]  [place], [place] [address]  Fax: [phone]   Leighton Roach. Truett Perna, M.D.  [address] [facility]  [place]  [place]  [address]  Fax: [phone]

## 2010-12-12 NOTE — H&P (Signed)
NAMESHEWANDA, Mathews                    ACCOUNT NO.:  [id number]   MEDICAL RECORD NO.:  [id number]                   PATIENT TYPE:  INP   LOCATION:  NA                                   FACILITY:  [facility]   PHYSICIAN:  Ines Bloomer, M.D.              DATE OF BIRTH:  1928/05/14   DATE OF ADMISSION:  02/05/2004  DATE OF DISCHARGE:                                HISTORY & PHYSICAL   PRIMARY CARE [name]:  [name].   ONCOLOGIST:  [name], M.D.   HISTORY OF PRESENT ILLNESS:  A 77 year old Caucasian female was referred  from Dr. Truett Mathews for evaluation of multiple lung nodules.  She has a  diagnosis of lymphoma and followup CT scans revealed bilateral lung nodules.  Followup scans showed increase in the size and number of these nodules.  She  was evaluated by Dr. Edwyna Mathews at the [facility] office, on January 09, 2004.  After  examination of the patient and review of the available records, he  recommended proceeding with open lung biopsies.   PAST MEDICAL HISTORY:  1. COPD.  2. Low grade lymphoma, diagnosed September 2004, after excision of a right     lateral chest wall mass.  No treatment has been necessary.  3. ITP this is followed by Dr. Truett Mathews, her last platelet count 231,000.  [name] specifically denies any history of coronary disease,  hypertension, diabetes, kidney disease, or thyroid disease.   This patient has no known drug allergies.   MEDICATIONS:  Prior to admission:  1. Acyclovir 200 mg b.i.d.  2. Advair 100/50 mg p.r.n. (probably used every other day).  3. She uses vitamin supplementation, multivitamin, calcium, vitamin C, B     vitamins, vitamin E.  No herbal supplements are used.   SOCIAL HISTORY:  She and her husband have been married 41 years, four adult  children.  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  She does  not smoke cigarettes or use alcohol.  She lives with her family in a single  level dwelling.  She drives  and her husband, [name], will be  available for assistance after discharge from the hospital.   FAMILY HISTORY:  Significant for her mother died at 32 possibly from cancer.  Her father died at age 36 from lung cancer.   REVIEW OF SYSTEMS:  GENERAL:  [name] reports feeling very well.  She  has significant right ear hearing loss.  She wears a hearing-aid.  She also  has partial upper plate dentures.  She has some dyspnea on exertion.  She  has an occasional dry cough.  No chest pain, no pedal edema.  Her appetite  is good.  Her weight has been stable.  No changes in her bowel habits.  No  GU complaints.  No dizziness, syncope, or recent falls.  She ambulates  independently, does not use any assisting devices.  She reports no  claudication, but she does have occasional leg cramps at night.  She has had  no recent infections or fever.   PHYSICAL EXAMINATION:  VITAL SIGNS:  Blood pressure 136/84.  She is 5'2  tall.  She weighs 140 pounds.  GENERAL:  This is a 77 year old Caucasian female who appears younger than  her stated age.  She is in no acute distress.  She is very pleasant and  cooperative with this exam.  EYES:  PERRLA.  EOMI.  THROAT:  Oral mucosa is pink and moist.  NECK:  With a full range of motion.  No carotid bruits, thyromegaly, or  lymphadenopathy.  LUNGS:  Her breathing is unlabored.  Her breath sounds are clear  bilaterally.  HEART:  Is in a regular rhythm.  There are no murmurs.  She does have a well  healed, small, right, mid axillary scar.  ABDOMEN:  Flat with normoactive bowel sounds.  It is soft and nontender.  EXTREMITIES:  Lower extremities are without edema, varicosities, or venous  stasis changes.  She has 2+ femoral pulses bleeding and 2+ pedal pulses  bilaterally.  NEUROLOGIC:  She is alert and oriented.  Cranial nerves II-XII are grossly  intact.  Her gait is steady.  She has full range of motion all of her  extremities.  Muscle strength is  5/5 in her lower extremities.   LABORATORY STUDIES:  Pending and we will do a CBC, B-MET, PT, INR, and a  urinalysis.   PFTs performed at the [facility] office, on January 09, 2004, revealed FVC 1.58, and  FEV1 at 1.  These results are approximately 50% of normal.   IMPRESSION:  This is a 77 year old female with low grade lymphoma and  multiple bilateral lung nodules.   PLAN:  Elective admission to [facility] in the care of Dr. Edwyna Mathews,  on February 05, 2004, for an open lung biopsy on the right.  The procedure risks  and benefits have been discussed with [name].  She agrees to proceed  with surgery.      [name], N.P.                  Ines Bloomer, M.D.    CTK/MEDQ  D:  02/01/2004  T:  02/02/2004  Job:  [id number]   cc:   patient's hospital chart   Ines Bloomer, M.D.  [address]  [place]  [place] [address]

## 2010-12-17 ENCOUNTER — Ambulatory Visit (HOSPITAL_COMMUNITY)
Admission: RE | Admit: 2010-12-17 | Discharge: 2010-12-17 | Disposition: A | Discharge status: Home / Self Care | Payer: Medicare Other | Source: Ambulatory Visit | Attending: Oncology | Admitting: Oncology

## 2010-12-17 ENCOUNTER — Other Ambulatory Visit: Payer: Self-pay | Admitting: Oncology

## 2010-12-17 ENCOUNTER — Encounter (HOSPITAL_BASED_OUTPATIENT_CLINIC_OR_DEPARTMENT_OTHER): Payer: Medicare Other | Admitting: Oncology

## 2010-12-17 DIAGNOSIS — R05 Cough: Secondary | ICD-10-CM

## 2010-12-17 DIAGNOSIS — R059 Cough, unspecified: Secondary | ICD-10-CM

## 2010-12-17 DIAGNOSIS — D630 Anemia in neoplastic disease: Secondary | ICD-10-CM

## 2010-12-17 DIAGNOSIS — T451X5A Adverse effect of antineoplastic and immunosuppressive drugs, initial encounter: Secondary | ICD-10-CM

## 2010-12-17 DIAGNOSIS — C8589 Other specified types of non-Hodgkin lymphoma, extranodal and solid organ sites: Secondary | ICD-10-CM

## 2010-12-17 DIAGNOSIS — C8588 Other specified types of non-Hodgkin lymphoma, lymph nodes of multiple sites: Secondary | ICD-10-CM

## 2010-12-17 DIAGNOSIS — D693 Immune thrombocytopenic purpura: Secondary | ICD-10-CM

## 2010-12-17 LAB — CBC WITH DIFFERENTIAL/PLATELET
BASO%: 0.7 % (ref 0.0–2.0)
Basophils Absolute: 0 10*3/uL (ref 0.0–0.1)
HCT: 30.1 % — ABNORMAL LOW (ref 34.8–46.6)
HGB: 9.8 g/dL — ABNORMAL LOW (ref 11.6–15.9)
MONO#: 0.3 10*3/uL (ref 0.1–0.9)
NEUT%: 69.5 % (ref 38.4–76.8)
WBC: 4.1 10*3/uL (ref 3.9–10.3)
lymph#: 0.7 10*3/uL — ABNORMAL LOW (ref 0.9–3.3)

## 2010-12-31 ENCOUNTER — Other Ambulatory Visit: Payer: Self-pay | Admitting: Oncology

## 2010-12-31 ENCOUNTER — Encounter (HOSPITAL_BASED_OUTPATIENT_CLINIC_OR_DEPARTMENT_OTHER): Payer: Medicare Other | Admitting: Oncology

## 2010-12-31 DIAGNOSIS — D630 Anemia in neoplastic disease: Secondary | ICD-10-CM

## 2010-12-31 DIAGNOSIS — D649 Anemia, unspecified: Secondary | ICD-10-CM

## 2010-12-31 DIAGNOSIS — D693 Immune thrombocytopenic purpura: Secondary | ICD-10-CM

## 2010-12-31 DIAGNOSIS — C50919 Malignant neoplasm of unspecified site of unspecified female breast: Secondary | ICD-10-CM

## 2010-12-31 DIAGNOSIS — T451X5A Adverse effect of antineoplastic and immunosuppressive drugs, initial encounter: Secondary | ICD-10-CM

## 2010-12-31 DIAGNOSIS — C8589 Other specified types of non-Hodgkin lymphoma, extranodal and solid organ sites: Secondary | ICD-10-CM

## 2010-12-31 DIAGNOSIS — D6481 Anemia due to antineoplastic chemotherapy: Secondary | ICD-10-CM

## 2010-12-31 LAB — COMPREHENSIVE METABOLIC PANEL
Albumin: 4.1 g/dL (ref 3.5–5.2)
Alkaline Phosphatase: 77 U/L (ref 39–117)
BUN: 14 mg/dL (ref 6–23)
CO2: 28 mEq/L (ref 19–32)
Calcium: 9 mg/dL (ref 8.4–10.5)
Glucose, Bld: 105 mg/dL — ABNORMAL HIGH (ref 70–99)
Potassium: 4.1 mEq/L (ref 3.5–5.3)

## 2010-12-31 LAB — CBC WITH DIFFERENTIAL/PLATELET
Basophils Absolute: 0 10*3/uL (ref 0.0–0.1)
Eosinophils Absolute: 0.3 10*3/uL (ref 0.0–0.5)
HGB: 10.8 g/dL — ABNORMAL LOW (ref 11.6–15.9)
MCV: 76.8 fL — ABNORMAL LOW (ref 79.5–101.0)
MONO#: 0.2 10*3/uL (ref 0.1–0.9)
MONO%: 7.8 % (ref 0.0–14.0)
NEUT#: 2 10*3/uL (ref 1.5–6.5)
Platelets: 108 10*3/uL — ABNORMAL LOW (ref 145–400)
RDW: 15.9 % — ABNORMAL HIGH (ref 11.2–14.5)

## 2010-12-31 LAB — LACTATE DEHYDROGENASE: LDH: 138 U/L (ref 94–250)

## 2011-01-01 ENCOUNTER — Encounter (HOSPITAL_BASED_OUTPATIENT_CLINIC_OR_DEPARTMENT_OTHER): Payer: Medicare Other | Admitting: Oncology

## 2011-01-01 DIAGNOSIS — Z5111 Encounter for antineoplastic chemotherapy: Secondary | ICD-10-CM

## 2011-01-01 DIAGNOSIS — C8589 Other specified types of non-Hodgkin lymphoma, extranodal and solid organ sites: Secondary | ICD-10-CM

## 2011-01-01 DIAGNOSIS — Z5112 Encounter for antineoplastic immunotherapy: Secondary | ICD-10-CM

## 2011-01-02 ENCOUNTER — Encounter (HOSPITAL_BASED_OUTPATIENT_CLINIC_OR_DEPARTMENT_OTHER): Payer: Medicare Other | Admitting: Oncology

## 2011-01-02 DIAGNOSIS — Z5111 Encounter for antineoplastic chemotherapy: Secondary | ICD-10-CM

## 2011-01-02 DIAGNOSIS — C8589 Other specified types of non-Hodgkin lymphoma, extranodal and solid organ sites: Secondary | ICD-10-CM

## 2011-01-27 ENCOUNTER — Encounter (HOSPITAL_BASED_OUTPATIENT_CLINIC_OR_DEPARTMENT_OTHER): Payer: Medicare Other | Admitting: Oncology

## 2011-01-27 DIAGNOSIS — C8589 Other specified types of non-Hodgkin lymphoma, extranodal and solid organ sites: Secondary | ICD-10-CM

## 2011-02-02 ENCOUNTER — Encounter (HOSPITAL_BASED_OUTPATIENT_CLINIC_OR_DEPARTMENT_OTHER): Payer: Medicare Other | Admitting: Oncology

## 2011-02-02 ENCOUNTER — Other Ambulatory Visit: Payer: Self-pay | Admitting: Oncology

## 2011-02-02 DIAGNOSIS — C8589 Other specified types of non-Hodgkin lymphoma, extranodal and solid organ sites: Secondary | ICD-10-CM

## 2011-02-02 DIAGNOSIS — Z5111 Encounter for antineoplastic chemotherapy: Secondary | ICD-10-CM

## 2011-02-02 LAB — CBC WITH DIFFERENTIAL/PLATELET
BASO%: 1.2 % (ref 0.0–2.0)
Eosinophils Absolute: 0.2 10*3/uL (ref 0.0–0.5)
MCHC: 32.8 g/dL (ref 31.5–36.0)
MONO#: 0.3 10*3/uL (ref 0.1–0.9)
NEUT#: 1.8 10*3/uL (ref 1.5–6.5)
RBC: 4.19 10*6/uL (ref 3.70–5.45)
RDW: 14.7 % — ABNORMAL HIGH (ref 11.2–14.5)
WBC: 2.6 10*3/uL — ABNORMAL LOW (ref 3.9–10.3)
lymph#: 0.4 10*3/uL — ABNORMAL LOW (ref 0.9–3.3)
nRBC: 0 % (ref 0–0)

## 2011-02-02 LAB — COMPREHENSIVE METABOLIC PANEL
ALT: 15 U/L (ref 0–35)
AST: 20 U/L (ref 0–37)
CO2: 28 mEq/L (ref 19–32)
Calcium: 8.9 mg/dL (ref 8.4–10.5)
Chloride: 104 mEq/L (ref 96–112)
Sodium: 138 mEq/L (ref 135–145)
Total Protein: 6.5 g/dL (ref 6.0–8.3)

## 2011-02-03 ENCOUNTER — Encounter (HOSPITAL_BASED_OUTPATIENT_CLINIC_OR_DEPARTMENT_OTHER): Payer: Medicare Other | Admitting: Oncology

## 2011-02-03 DIAGNOSIS — Z5111 Encounter for antineoplastic chemotherapy: Secondary | ICD-10-CM

## 2011-02-03 DIAGNOSIS — D693 Immune thrombocytopenic purpura: Secondary | ICD-10-CM

## 2011-02-03 DIAGNOSIS — C8589 Other specified types of non-Hodgkin lymphoma, extranodal and solid organ sites: Secondary | ICD-10-CM

## 2011-02-03 DIAGNOSIS — C50919 Malignant neoplasm of unspecified site of unspecified female breast: Secondary | ICD-10-CM

## 2011-02-27 ENCOUNTER — Encounter (HOSPITAL_BASED_OUTPATIENT_CLINIC_OR_DEPARTMENT_OTHER): Payer: Medicare Other | Admitting: Oncology

## 2011-02-27 ENCOUNTER — Other Ambulatory Visit: Payer: Self-pay | Admitting: Oncology

## 2011-02-27 DIAGNOSIS — R918 Other nonspecific abnormal finding of lung field: Secondary | ICD-10-CM

## 2011-02-27 DIAGNOSIS — Z5111 Encounter for antineoplastic chemotherapy: Secondary | ICD-10-CM

## 2011-02-27 DIAGNOSIS — C8589 Other specified types of non-Hodgkin lymphoma, extranodal and solid organ sites: Secondary | ICD-10-CM

## 2011-02-27 DIAGNOSIS — C8590 Non-Hodgkin lymphoma, unspecified, unspecified site: Secondary | ICD-10-CM

## 2011-02-27 LAB — CBC WITH DIFFERENTIAL/PLATELET
BASO%: 0.6 % (ref 0.0–2.0)
EOS%: 2.5 % (ref 0.0–7.0)
HCT: 25 % — ABNORMAL LOW (ref 34.8–46.6)
LYMPH%: 25 % (ref 14.0–49.7)
MCH: 26.8 pg (ref 25.1–34.0)
MCHC: 34.3 g/dL (ref 31.5–36.0)
MONO#: 0.3 10*3/uL (ref 0.1–0.9)
NEUT%: 56.7 % (ref 38.4–76.8)
Platelets: 177 10*3/uL (ref 145–400)
RBC: 3.2 10*6/uL — ABNORMAL LOW (ref 3.70–5.45)
WBC: 2.2 10*3/uL — ABNORMAL LOW (ref 3.9–10.3)
lymph#: 0.5 10*3/uL — ABNORMAL LOW (ref 0.9–3.3)

## 2011-02-27 LAB — RETICULOCYTES (CHCC): Retic Ct Pct: 1.1 % (ref 0.4–2.3)

## 2011-03-03 ENCOUNTER — Encounter (HOSPITAL_COMMUNITY): Payer: Self-pay

## 2011-03-03 ENCOUNTER — Other Ambulatory Visit: Payer: Self-pay | Admitting: Oncology

## 2011-03-03 ENCOUNTER — Encounter (HOSPITAL_BASED_OUTPATIENT_CLINIC_OR_DEPARTMENT_OTHER): Payer: Medicare Other | Admitting: Oncology

## 2011-03-03 ENCOUNTER — Ambulatory Visit (HOSPITAL_COMMUNITY)
Admission: RE | Admit: 2011-03-03 | Discharge: 2011-03-03 | Disposition: A | Discharge status: Home / Self Care | Payer: Medicare Other | Source: Ambulatory Visit | Attending: Oncology | Admitting: Oncology

## 2011-03-03 DIAGNOSIS — D693 Immune thrombocytopenic purpura: Secondary | ICD-10-CM

## 2011-03-03 DIAGNOSIS — I708 Atherosclerosis of other arteries: Secondary | ICD-10-CM | POA: Insufficient documentation

## 2011-03-03 DIAGNOSIS — J984 Other disorders of lung: Secondary | ICD-10-CM | POA: Insufficient documentation

## 2011-03-03 DIAGNOSIS — I722 Aneurysm of renal artery: Secondary | ICD-10-CM | POA: Insufficient documentation

## 2011-03-03 DIAGNOSIS — R918 Other nonspecific abnormal finding of lung field: Secondary | ICD-10-CM

## 2011-03-03 DIAGNOSIS — C50919 Malignant neoplasm of unspecified site of unspecified female breast: Secondary | ICD-10-CM

## 2011-03-03 DIAGNOSIS — D6481 Anemia due to antineoplastic chemotherapy: Secondary | ICD-10-CM

## 2011-03-03 DIAGNOSIS — C8589 Other specified types of non-Hodgkin lymphoma, extranodal and solid organ sites: Secondary | ICD-10-CM | POA: Insufficient documentation

## 2011-03-03 DIAGNOSIS — C8590 Non-Hodgkin lymphoma, unspecified, unspecified site: Secondary | ICD-10-CM

## 2011-03-03 DIAGNOSIS — M5126 Other intervertebral disc displacement, lumbar region: Secondary | ICD-10-CM | POA: Insufficient documentation

## 2011-03-03 DIAGNOSIS — R911 Solitary pulmonary nodule: Secondary | ICD-10-CM | POA: Insufficient documentation

## 2011-03-03 DIAGNOSIS — T451X5A Adverse effect of antineoplastic and immunosuppressive drugs, initial encounter: Secondary | ICD-10-CM

## 2011-03-03 DIAGNOSIS — C8588 Other specified types of non-Hodgkin lymphoma, lymph nodes of multiple sites: Secondary | ICD-10-CM

## 2011-03-03 LAB — CBC WITH DIFFERENTIAL/PLATELET
BASO%: 0.9 % (ref 0.0–2.0)
EOS%: 1.7 % (ref 0.0–7.0)
HCT: 27.4 % — ABNORMAL LOW (ref 34.8–46.6)
LYMPH%: 17.8 % (ref 14.0–49.7)
MCH: 25.5 pg (ref 25.1–34.0)
MCHC: 32.5 g/dL (ref 31.5–36.0)
MONO%: 18.7 % — ABNORMAL HIGH (ref 0.0–14.0)
NEUT%: 60.9 % (ref 38.4–76.8)
Platelets: 215 10*3/uL (ref 145–400)
RBC: 3.49 10*6/uL — ABNORMAL LOW (ref 3.70–5.45)
WBC: 2.3 10*3/uL — ABNORMAL LOW (ref 3.9–10.3)
nRBC: 0 % (ref 0–0)

## 2011-03-04 LAB — COMPREHENSIVE METABOLIC PANEL
AST: 17 U/L (ref 0–37)
Albumin: 3.7 g/dL (ref 3.5–5.2)
Alkaline Phosphatase: 138 U/L — ABNORMAL HIGH (ref 39–117)
BUN: 15 mg/dL (ref 6–23)
Creatinine, Ser: 1.01 mg/dL (ref 0.50–1.10)
Glucose, Bld: 101 mg/dL — ABNORMAL HIGH (ref 70–99)
Total Bilirubin: 0.3 mg/dL (ref 0.3–1.2)

## 2011-03-09 ENCOUNTER — Other Ambulatory Visit: Payer: Self-pay | Admitting: Oncology

## 2011-03-09 ENCOUNTER — Encounter (HOSPITAL_BASED_OUTPATIENT_CLINIC_OR_DEPARTMENT_OTHER): Payer: Medicare Other | Admitting: Oncology

## 2011-03-09 DIAGNOSIS — C8588 Other specified types of non-Hodgkin lymphoma, lymph nodes of multiple sites: Secondary | ICD-10-CM

## 2011-03-09 DIAGNOSIS — T451X5A Adverse effect of antineoplastic and immunosuppressive drugs, initial encounter: Secondary | ICD-10-CM

## 2011-03-09 DIAGNOSIS — C50919 Malignant neoplasm of unspecified site of unspecified female breast: Secondary | ICD-10-CM

## 2011-03-09 DIAGNOSIS — D693 Immune thrombocytopenic purpura: Secondary | ICD-10-CM

## 2011-03-09 DIAGNOSIS — C8589 Other specified types of non-Hodgkin lymphoma, extranodal and solid organ sites: Secondary | ICD-10-CM

## 2011-03-09 LAB — CBC WITH DIFFERENTIAL/PLATELET
Basophils Absolute: 0 10*3/uL (ref 0.0–0.1)
Eosinophils Absolute: 0.1 10*3/uL (ref 0.0–0.5)
HCT: 25.8 % — ABNORMAL LOW (ref 34.8–46.6)
HGB: 9 g/dL — ABNORMAL LOW (ref 11.6–15.9)
MCH: 26.9 pg (ref 25.1–34.0)
MCV: 77.2 fL — ABNORMAL LOW (ref 79.5–101.0)
MONO%: 11.1 % (ref 0.0–14.0)
NEUT#: 1.7 10*3/uL (ref 1.5–6.5)
NEUT%: 67.3 % (ref 38.4–76.8)
RDW: 14.8 % — ABNORMAL HIGH (ref 11.2–14.5)

## 2011-03-11 ENCOUNTER — Telehealth: Payer: Self-pay | Admitting: Pulmonary Disease

## 2011-03-11 NOTE — Telephone Encounter (Signed)
[email or web address].[name], CMA  

## 2011-03-12 ENCOUNTER — Telehealth: Payer: Self-pay | Admitting: Pulmonary Disease

## 2011-03-12 ENCOUNTER — Encounter: Payer: Self-pay | Admitting: Pulmonary Disease

## 2011-03-12 ENCOUNTER — Institutional Professional Consult (permissible substitution): Payer: Medicare Other | Admitting: Pulmonary Disease

## 2011-03-12 ENCOUNTER — Ambulatory Visit (INDEPENDENT_AMBULATORY_CARE_PROVIDER_SITE_OTHER): Payer: Medicare Other | Admitting: Pulmonary Disease

## 2011-03-12 VITALS — BP 120/70 | HR 82 | Temp 98.4°F | Ht 63.0 in | Wt 136.6 lb

## 2011-03-12 DIAGNOSIS — R9389 Abnormal findings on diagnostic imaging of other specified body structures: Secondary | ICD-10-CM

## 2011-03-12 NOTE — Progress Notes (Signed)
  Subjective:    Patient ID: [name], female    DOB: 1928-03-20, 77 y.o.   MRN: [id number]  HPI PCP - [facility]  83/F , never smoker with Non Hodgkin's lymphoma referred for evaluaiton of abnormal imaging. NHL was first  Treated with fludarabine/ rituxan in July 2009, salvage therapy with bendamustine / rituxan was initiated in 5/12 after CT 4/12  showed progressive lymphoma in the abdomen/ pelvis. She has completed 3 cycles.  Restaging CT in 8/12 showed stable soft tissue density in retroperitoneum but  reduced wall thickening associated with the dilated loop of small bowel in the pelvis. CT chest showed progressive bilateral scarring and ground-glass opacities in both lungs, with some peribronchovascular predominance and favoring  the upper lobes. A spiculated lingular opacity was essentially resolved and replaced with some faint regional ground-glass opacity  blending into the background appearance. She also has a h/o BL mastectomy 2/12  for synchronous malignancies (rt pT1N0 & Lt pT2 N0) , ER /PR pos, Her2  Neg, onadjuvant arimidex. She also carries a diagnosis of early Parkinson's & takes advair for 'asthma'  Her main complaint is a cough , dry - water makes better, codeine makes her sleepy Takes ambein prn  C/o Jitters, uneasy, sleepy in daytime She is on prophylactic acyclovir for herpes zoster & has ITP. Review of her last labs show a WC of 2.3, adequate plts.   Review of Systems  Constitutional: Negative for fever and unexpected weight change.  HENT: Negative for ear pain, nosebleeds, congestion, sore throat, rhinorrhea, sneezing, trouble swallowing, dental problem, postnasal drip and sinus pressure.   Eyes: Negative for redness and itching.  Respiratory: Positive for cough. Negative for chest tightness, shortness of breath and wheezing.   Cardiovascular: Negative for palpitations and leg swelling.  Gastrointestinal: Negative for nausea and vomiting.   Genitourinary: Negative for dysuria.  Musculoskeletal: Negative for joint swelling.  Skin: Negative for rash.  Neurological: Negative for headaches.  Hematological: Does not bruise/bleed easily.  Psychiatric/Behavioral: Negative for dysphoric mood. The patient is not nervous/anxious.        Objective:   Physical Exam  Gen. Pleasant, well-nourished, in no distress, normal affect ENT - no lesions, no post nasal drip Neck: No JVD, no thyromegaly, no carotid bruits Lungs: no use of accessory muscles, no dullness to percussion, clear without rales or rhonchi  Cardiovascular: Rhythm regular, heart sounds  normal, no murmurs or gallops, no peripheral edema Abdomen: soft and non-tender, no hepatosplenomegaly, BS normal. Musculoskeletal: No deformities, no cyanosis or clubbing Neuro:  alert, non focal       Assessment & Plan:

## 2011-03-12 NOTE — Patient Instructions (Signed)
YOu have patchy infiltrates in your lung that have worsened from April 2012 - this may be inflammation or infection Bronchoscopy scheduled for Tuesday 10 am I will discuss with Dr Myrle Sheng. OK to take cough syrup as needed

## 2011-03-12 NOTE — Telephone Encounter (Signed)
Spoke with Crystal at cancer center and pt needs to be seen for lung infiltrates. Pt has hx of non-hodgkin's lymphoma and breast cancer. Crystal will fax over any office notes that she has and she will contact the pt regarding this appointment. Crystal was fine with scheduling the appt with Dr. Vassie Loll today.

## 2011-03-12 NOTE — Telephone Encounter (Signed)
Per RA need to let pt know he did speak with Dr. Myrle Sheng and he is okay with pt proceeding with Bronch on Tuesday. lmomtcb x 1

## 2011-03-12 NOTE — Telephone Encounter (Signed)
Spoke to pt and advised if she wanted to be seen today it would have to be with dr Vassie Loll, if she wants to see dr clance we can search for another appt.---pt called back before we got finished with this msg and decided she would take appt with dr Vassie Loll, advised pt to bring all medications and any x-rays she has. Lawson Fiscal spoke to dr sherrill's office (crystal) and they will fax records for this appt--pt will see dr Vassie Loll at 2:30 today

## 2011-03-13 ENCOUNTER — Encounter: Payer: Self-pay | Admitting: Pulmonary Disease

## 2011-03-13 DIAGNOSIS — R9389 Abnormal findings on diagnostic imaging of other specified body structures: Secondary | ICD-10-CM | POA: Insufficient documentation

## 2011-03-13 NOTE — Telephone Encounter (Signed)
Returning call.

## 2011-03-13 NOTE — Assessment & Plan Note (Addendum)
DD is broad - would favor inflammation over infection given chronic (> 4 month) course for worsening but smoldering fungal infections are possible. Also malignancy remains remotely possible - metastatic breast, unlikely  Lymphoma involving the lung or primary lung CA  In this never smoker. The various options of biopsy including bronchoscopy and surgical biopsy were discussed.The risks of each procedure including coughing, bleeding and the  chances of lung puncture requiring chest tube were discussed in great detail. The benefits & alternatives including serial follow up were also discussed. Empiric steroids can be tried but I would not recommend this . Also,more chemotherpy is being considered. I would recommend proceeding with a bronchoscopy - the yield of 60-70% was discussed. Dr Truett Perna agrees with t he plan

## 2011-03-13 NOTE — Telephone Encounter (Signed)
Pt informed that Dr. Vassie Loll did speak with Dr. Truett Perna and Julieta Bellini will proceed on Tues., 8/21 as planned. Pt wanted to let RA know that the cough medication she has been using is Delsym OTC.

## 2011-03-16 NOTE — Telephone Encounter (Signed)
lmomtcb to schedule pt on Thursday @ 2:30pm. If schedule is not open yet please advise LED and she will schedule same.

## 2011-03-17 ENCOUNTER — Other Ambulatory Visit: Payer: Self-pay | Admitting: Pulmonary Disease

## 2011-03-17 ENCOUNTER — Ambulatory Visit (HOSPITAL_COMMUNITY)
Admission: RE | Admit: 2011-03-17 | Discharge: 2011-03-17 | Disposition: A | Discharge status: Home / Self Care | Payer: Medicare Other | Source: Ambulatory Visit | Attending: Pulmonary Disease | Admitting: Pulmonary Disease

## 2011-03-17 DIAGNOSIS — R918 Other nonspecific abnormal finding of lung field: Secondary | ICD-10-CM

## 2011-03-17 DIAGNOSIS — G20A1 Parkinson's disease without dyskinesia, without mention of fluctuations: Secondary | ICD-10-CM | POA: Insufficient documentation

## 2011-03-17 DIAGNOSIS — Z801 Family history of malignant neoplasm of trachea, bronchus and lung: Secondary | ICD-10-CM | POA: Insufficient documentation

## 2011-03-17 DIAGNOSIS — G20 Parkinson's disease: Secondary | ICD-10-CM | POA: Insufficient documentation

## 2011-03-17 DIAGNOSIS — C801 Malignant (primary) neoplasm, unspecified: Secondary | ICD-10-CM

## 2011-03-17 DIAGNOSIS — C8589 Other specified types of non-Hodgkin lymphoma, extranodal and solid organ sites: Secondary | ICD-10-CM | POA: Insufficient documentation

## 2011-03-17 DIAGNOSIS — C8299 Follicular lymphoma, unspecified, extranodal and solid organ sites: Secondary | ICD-10-CM

## 2011-03-17 DIAGNOSIS — J45909 Unspecified asthma, uncomplicated: Secondary | ICD-10-CM | POA: Insufficient documentation

## 2011-03-17 DIAGNOSIS — Z79899 Other long term (current) drug therapy: Secondary | ICD-10-CM | POA: Insufficient documentation

## 2011-03-18 ENCOUNTER — Telehealth: Payer: Self-pay | Admitting: Pulmonary Disease

## 2011-03-18 LAB — PNEUMOCYSTIS JIROVECI SMEAR BY DFA: Pneumocystis jiroveci Ag: NEGATIVE

## 2011-03-18 NOTE — Telephone Encounter (Signed)
Pt was calling to find out when Dr. Vassie Loll would call with bronchoscopy results. I informed her that this may take a few days but as soon as Dr. Vassie Loll gets the results he will call or have his nurse call with the results and if a sooner appt is needed they will scheduled this with her then. Pt verbalized understanding of this and had me also tell her spouse.

## 2011-03-19 LAB — CULTURE, RESPIRATORY W GRAM STAIN

## 2011-03-23 ENCOUNTER — Telehealth: Payer: Self-pay | Admitting: Pulmonary Disease

## 2011-03-23 NOTE — Telephone Encounter (Signed)
Jessica pls advise if you have any results from dr Vassie Loll

## 2011-03-26 NOTE — Telephone Encounter (Signed)
PT RETURNED CALL FROM JESSICA R. CALL [phone]. [name]

## 2011-03-26 NOTE — Progress Notes (Signed)
Quick Note:  I informed pt of RA's findings and recommendations. Pt verbalized understanding. Note faxed to Dr. Truett Perna ______

## 2011-03-26 NOTE — Telephone Encounter (Signed)
lmomtcb x1 

## 2011-03-26 NOTE — Telephone Encounter (Signed)
I informed pt of RA's findings and recommendations. Pt verbalized understanding. Results faxed to Dr. Truett Perna.

## 2011-04-01 ENCOUNTER — Other Ambulatory Visit: Payer: Self-pay | Admitting: Oncology

## 2011-04-01 ENCOUNTER — Encounter (HOSPITAL_BASED_OUTPATIENT_CLINIC_OR_DEPARTMENT_OTHER): Payer: Medicare Other | Admitting: Oncology

## 2011-04-01 DIAGNOSIS — I10 Essential (primary) hypertension: Secondary | ICD-10-CM

## 2011-04-01 DIAGNOSIS — D649 Anemia, unspecified: Secondary | ICD-10-CM

## 2011-04-01 DIAGNOSIS — D693 Immune thrombocytopenic purpura: Secondary | ICD-10-CM

## 2011-04-01 DIAGNOSIS — C8589 Other specified types of non-Hodgkin lymphoma, extranodal and solid organ sites: Secondary | ICD-10-CM

## 2011-04-01 DIAGNOSIS — C50919 Malignant neoplasm of unspecified site of unspecified female breast: Secondary | ICD-10-CM

## 2011-04-01 DIAGNOSIS — Z5111 Encounter for antineoplastic chemotherapy: Secondary | ICD-10-CM

## 2011-04-01 LAB — CBC WITH DIFFERENTIAL/PLATELET
Basophils Absolute: 0.1 10*3/uL (ref 0.0–0.1)
EOS%: 6.6 % (ref 0.0–7.0)
Eosinophils Absolute: 0.3 10*3/uL (ref 0.0–0.5)
HGB: 9.8 g/dL — ABNORMAL LOW (ref 11.6–15.9)
LYMPH%: 9.6 % — ABNORMAL LOW (ref 14.0–49.7)
MCH: 25.6 pg (ref 25.1–34.0)
MCV: 79.1 fL — ABNORMAL LOW (ref 79.5–101.0)
MONO%: 9 % (ref 0.0–14.0)
NEUT#: 3.4 10*3/uL (ref 1.5–6.5)
Platelets: 178 10*3/uL (ref 145–400)

## 2011-04-01 NOTE — Op Note (Addendum)
  [name], [name]        ACCOUNT NO.:  [id number]  MEDICAL RECORD NO.:  [id number]  LOCATION:  XRAY                         FACILITY:  [facility]  PHYSICIAN:  Oretha Milch, MD      DATE OF BIRTH:  1928-01-04  DATE OF PROCEDURE:  03/17/2011 DATE OF DISCHARGE:                              OPERATIVE REPORT   PROCEDURE PERFORMED:  Video bronchoscopy with biopsies.  INDICATIONS FOR PROCEDURE:  Ms. Markiewicz is an 77 year old never- smoker with non-Hodgkin's lymphoma, on chemotherapy.  She has developed bilateral pulmonary infiltrates which have been slowly progressive over the past 4 months on CT scan.  The differential diagnosis include inflammation, less likely infection, or malignancy.  Risks and benefits of the procedure including coughing, bleeding, and a small chance of lung puncture requiring chest tube were discussed with the patient in great detail and she evidenced understanding.  PROCEDURE IN DETAIL:  Written informed consent was obtained from the patient prior to the procedure.  2 mg of Versed and 50 mcg of fentanyl were used in divided doses.  Bronchoscope was inserted from the left nares.  Upper airway appeared normal.  Vocal cords showed normal appearance and motion.  The tracheobronchial tree was examined to the subsegmental level.  No endobronchial lesions were noted and very minimal phlegm was noted.  Attention was then turned to her right upper lobe, washings were obtained from this area and from the right lower lobe.  Transbronchial biopsies x4 were obtained from the right upper lobe.  Transbronchial biopsies x2 were obtained from the right lower lobe under fluoroscopy.  The patient tolerated the procedure well and was awake right after.  Chest x-ray will be performed to rule out the presence of pneumothorax.     Oretha Milch, MD     RVA/MEDQ  D:  03/17/2011  T:  03/17/2011  Job:  [id number]  cc:   Ladene Artist, M.D. Fax:  [phone]  Electronically Signed by Cyril Mourning MD on 04/01/2011 03:18:56 PM

## 2011-04-06 ENCOUNTER — Ambulatory Visit: Payer: Medicare Other | Admitting: Pulmonary Disease

## 2011-04-14 LAB — FUNGUS CULTURE W SMEAR: Fungal Smear: NONE SEEN

## 2011-04-23 LAB — BODY FLUID CELL COUNT WITH DIFFERENTIAL: Lymphs, Fluid: 97

## 2011-04-23 LAB — COMPREHENSIVE METABOLIC PANEL
ALT: 22
Albumin: 2 — ABNORMAL LOW
BUN: 19
BUN: 7
Calcium: 7.7 — ABNORMAL LOW
Calcium: 7.9 — ABNORMAL LOW
Creatinine, Ser: 1.01
Glucose, Bld: 101 — ABNORMAL HIGH
Glucose, Bld: 99
Potassium: 4
Sodium: 140
Total Protein: 4.8 — ABNORMAL LOW
Total Protein: 5.5 — ABNORMAL LOW

## 2011-04-23 LAB — DIFFERENTIAL
Basophils Absolute: 0
Basophils Relative: 0
Basophils Relative: 1
Eosinophils Absolute: 0.2
Eosinophils Relative: 8 — ABNORMAL HIGH
Eosinophils Relative: 9 — ABNORMAL HIGH
Lymphocytes Relative: 12
Lymphocytes Relative: 5 — ABNORMAL LOW
Lymphocytes Relative: 8 — ABNORMAL LOW
Lymphs Abs: 0.2 — ABNORMAL LOW
Lymphs Abs: 0.6 — ABNORMAL LOW
Monocytes Absolute: 0.1
Monocytes Absolute: 0.2
Monocytes Absolute: 0.3
Monocytes Relative: 10
Monocytes Relative: 12
Neutro Abs: 1.2 — ABNORMAL LOW
Neutro Abs: 1.7
Neutrophils Relative %: 57
Neutrophils Relative %: 62
Neutrophils Relative %: 79 — ABNORMAL HIGH

## 2011-04-23 LAB — AFB CULTURE WITH SMEAR (NOT AT ARMC)

## 2011-04-23 LAB — CULTURE, BLOOD (ROUTINE X 2): Culture: NO GROWTH

## 2011-04-23 LAB — BASIC METABOLIC PANEL
CO2: 22
Calcium: 8.3 — ABNORMAL LOW
Creatinine, Ser: 1.24 — ABNORMAL HIGH
GFR calc Af Amer: 51 — ABNORMAL LOW
GFR calc non Af Amer: 42 — ABNORMAL LOW
Sodium: 137

## 2011-04-23 LAB — CROSSMATCH
ABO/RH(D): O POS
DAT, IgG: POSITIVE

## 2011-04-23 LAB — CBC
HCT: 28 — ABNORMAL LOW
Hemoglobin: 8.6 — ABNORMAL LOW
MCHC: 34
MCHC: 34
MCV: 76.6 — ABNORMAL LOW
MCV: 76.9 — ABNORMAL LOW
Platelets: 118 — ABNORMAL LOW
Platelets: 72 — ABNORMAL LOW
RBC: 3.64 — ABNORMAL LOW
RBC: 3.64 — ABNORMAL LOW
RDW: 15.1
RDW: 15.2
WBC: 2 — ABNORMAL LOW
WBC: 2 — ABNORMAL LOW

## 2011-04-23 LAB — BODY FLUID CULTURE

## 2011-04-23 LAB — URINALYSIS, ROUTINE W REFLEX MICROSCOPIC
Hgb urine dipstick: NEGATIVE
Protein, ur: >300 — AB
Urobilinogen, UA: 0.2

## 2011-04-23 LAB — CLOSTRIDIUM DIFFICILE EIA: C difficile Toxins A+B, EIA: NEGATIVE

## 2011-04-23 LAB — FUNGUS CULTURE W SMEAR: Fungal Smear: NONE SEEN

## 2011-04-23 LAB — PROTEIN, BODY FLUID: Total protein, fluid: <3

## 2011-04-23 LAB — PATHOLOGIST SMEAR REVIEW

## 2011-04-23 LAB — URINE CULTURE

## 2011-04-23 LAB — URINE MICROSCOPIC-ADD ON

## 2011-04-23 LAB — TYPE AND SCREEN

## 2011-04-23 LAB — LACTATE DEHYDROGENASE, PLEURAL OR PERITONEAL FLUID: LD, Fluid: 215 — ABNORMAL HIGH

## 2011-04-23 LAB — GLUCOSE, SEROUS FLUID: Glucose, Fluid: 103

## 2011-04-23 LAB — LACTATE DEHYDROGENASE: LDH: 272 — ABNORMAL HIGH

## 2011-04-23 LAB — URIC ACID: Uric Acid, Serum: 5

## 2011-04-24 LAB — CROSSMATCH
ABO/RH(D): O POS
Antibody Screen: POSITIVE

## 2011-05-07 ENCOUNTER — Other Ambulatory Visit: Payer: Self-pay | Admitting: Oncology

## 2011-05-07 ENCOUNTER — Encounter (HOSPITAL_BASED_OUTPATIENT_CLINIC_OR_DEPARTMENT_OTHER): Payer: Medicare Other | Admitting: Oncology

## 2011-05-07 DIAGNOSIS — Z23 Encounter for immunization: Secondary | ICD-10-CM

## 2011-05-07 DIAGNOSIS — C8589 Other specified types of non-Hodgkin lymphoma, extranodal and solid organ sites: Secondary | ICD-10-CM

## 2011-05-07 DIAGNOSIS — R5381 Other malaise: Secondary | ICD-10-CM

## 2011-05-07 DIAGNOSIS — C50919 Malignant neoplasm of unspecified site of unspecified female breast: Secondary | ICD-10-CM

## 2011-05-07 LAB — CBC WITH DIFFERENTIAL/PLATELET
BASO%: 2 % (ref 0.0–2.0)
EOS%: 5.1 % (ref 0.0–7.0)
HCT: 33.1 % — ABNORMAL LOW (ref 34.8–46.6)
LYMPH%: 13.7 % — ABNORMAL LOW (ref 14.0–49.7)
MCH: 26.4 pg (ref 25.1–34.0)
MCHC: 32.9 g/dL (ref 31.5–36.0)
NEUT%: 66.7 % (ref 38.4–76.8)
RBC: 4.13 10*6/uL (ref 3.70–5.45)
lymph#: 0.4 10*3/uL — ABNORMAL LOW (ref 0.9–3.3)

## 2011-05-07 LAB — COMPREHENSIVE METABOLIC PANEL
AST: 23 U/L (ref 0–37)
Alkaline Phosphatase: 96 U/L (ref 39–117)
BUN: 18 mg/dL (ref 6–23)
Creatinine, Ser: 0.98 mg/dL (ref 0.50–1.10)
Potassium: 4.1 mEq/L (ref 3.5–5.3)

## 2011-06-04 ENCOUNTER — Other Ambulatory Visit: Payer: Self-pay | Admitting: Oncology

## 2011-07-03 ENCOUNTER — Other Ambulatory Visit (HOSPITAL_BASED_OUTPATIENT_CLINIC_OR_DEPARTMENT_OTHER): Payer: Medicare Other | Admitting: Lab

## 2011-07-03 ENCOUNTER — Ambulatory Visit (HOSPITAL_BASED_OUTPATIENT_CLINIC_OR_DEPARTMENT_OTHER): Payer: Medicare Other | Admitting: Oncology

## 2011-07-03 ENCOUNTER — Telehealth: Payer: Self-pay | Admitting: Oncology

## 2011-07-03 ENCOUNTER — Other Ambulatory Visit: Payer: Self-pay | Admitting: Oncology

## 2011-07-03 VITALS — BP 133/59 | HR 82 | Temp 97.3°F | Ht 62.0 in | Wt 136.4 lb

## 2011-07-03 DIAGNOSIS — C8590 Non-Hodgkin lymphoma, unspecified, unspecified site: Secondary | ICD-10-CM | POA: Insufficient documentation

## 2011-07-03 DIAGNOSIS — D6481 Anemia due to antineoplastic chemotherapy: Secondary | ICD-10-CM

## 2011-07-03 DIAGNOSIS — C50419 Malignant neoplasm of upper-outer quadrant of unspecified female breast: Secondary | ICD-10-CM

## 2011-07-03 DIAGNOSIS — C8589 Other specified types of non-Hodgkin lymphoma, extranodal and solid organ sites: Secondary | ICD-10-CM

## 2011-07-03 DIAGNOSIS — Z5111 Encounter for antineoplastic chemotherapy: Secondary | ICD-10-CM

## 2011-07-03 DIAGNOSIS — D693 Immune thrombocytopenic purpura: Secondary | ICD-10-CM

## 2011-07-03 DIAGNOSIS — C50919 Malignant neoplasm of unspecified site of unspecified female breast: Secondary | ICD-10-CM

## 2011-07-03 LAB — COMPREHENSIVE METABOLIC PANEL
ALT: 19 U/L (ref 0–35)
AST: 26 U/L (ref 0–37)
Albumin: 4.1 g/dL (ref 3.5–5.2)
Alkaline Phosphatase: 86 U/L (ref 39–117)
BUN: 22 mg/dL (ref 6–23)
Calcium: 9.2 mg/dL (ref 8.4–10.5)
Chloride: 105 mEq/L (ref 96–112)
Potassium: 4.5 mEq/L (ref 3.5–5.3)

## 2011-07-03 LAB — CBC WITH DIFFERENTIAL/PLATELET
BASO%: 0.7 % (ref 0.0–2.0)
Eosinophils Absolute: 0.1 10*3/uL (ref 0.0–0.5)
HCT: 34.1 % — ABNORMAL LOW (ref 34.8–46.6)
HGB: 11.5 g/dL — ABNORMAL LOW (ref 11.6–15.9)
LYMPH%: 18.2 % (ref 14.0–49.7)
MCHC: 33.7 g/dL (ref 31.5–36.0)
MCV: 82 fL (ref 79.5–101.0)
MONO#: 0.3 10*3/uL (ref 0.1–0.9)
RBC: 4.16 10*6/uL (ref 3.70–5.45)
RDW: 14.3 % (ref 11.2–14.5)
lymph#: 0.5 10*3/uL — ABNORMAL LOW (ref 0.9–3.3)
nRBC: 0 % (ref 0–0)

## 2011-07-03 LAB — TECHNOLOGIST REVIEW

## 2011-07-03 NOTE — Telephone Encounter (Signed)
gve the pt her march 2013 appt calendar 

## 2011-07-03 NOTE — Progress Notes (Signed)
OFFICE PROGRESS NOTE   INTERVAL HISTORY:   She returns as scheduled. She reports an improved energy level. She denies dyspnea. She is tolerating the Arimidex well.  Objective:  Vital signs in last 24 hours:  Blood pressure 133/59, pulse 82, temperature 97.3 F (36.3 C), temperature source Oral, height 5\' 2"  (1.575 m), weight 136 lb 6.4 oz (61.871 kg).    HEENT: Neck without Lymphatics: No cervical, supraclavicular, axillary, or inguinal lymph nodes Resp: Lungs clear bilaterally Cardio: Regular rate and rhythm, 2/6 systolic murmur GI: Mild tenderness in the left mid abdomen. No mass. No hepatosplenomegaly. Vascular: No leg edema Neuro: There is an intermittent tremor of the right hand  Breast: Status post bilateral mastectomy. No evidence for chest wall tumor recurrence.    Lab Results:  Lab Results  Component Value Date   WBC 2.9* 07/03/2011   HGB 11.5* 07/03/2011   HCT 34.1* 07/03/2011   MCV 82.0 07/03/2011   PLT 128* 07/03/2011      Medications: I have reviewed the patient's current medications.  Assessment/Plan: 1. Non-Hodgkin lymphoma treated with fludarabine/rituximab, last in July of 2009.   a. Restaging CT 11/24/2010 revealed evidence of progressive lymphoma in the abdomen and pelvis.   b. Initiation of salvage therapy with bendamustine/rituximab 12/04/2010.  She completed 3 cycles.   c. Restaging CT 03/03/2011 revealed stable retroperitoneal soft tissue and a marked decrease in the soft tissue thickening associated with dilated loops of small bowel. 2. History of ITP. 3. History of Herpes zoster-maintained on prophylactic acyclovir. 4. Anemia secondary to non-Hodgkin lymphoma, chemotherapy, and a history of autoimmune hemolysis.  The hemoglobin has improved. 5. Hypertension. 6. Right-hand tremor-followed by [name]. 7. Hearing loss-status post placement of an implanted hearing device by Dr. Jac Canavan. 8. Bilateral breast cancer, right breast cancer-grade 1, T1 N0,  ER/PR positive, and HER2 negative.  Left breast cancer, synchronous grade 2, T2 N0, ER/PR positive, and HER2 negative.  She began adjuvant Arimidex on 09/15/2010. 9. Fatigue/malaise-likely secondary to non-Hodgkin lymphoma and chemotherapy-much improved. 10. Inflammatory changes of both lungs on a CT of the chest 11/24/2010-progressive on the restaging CT 03/03/2011, status post a bronchoscopy 03/17/2011 with no evidence of granulomata or tumor. 11. 11 mm spiculated lesion in the lingula on a CT 11/24/2010-less distinct on the CT 03/03/2011.   Disposition: She has an improved performance status. The anemia is better and there is no evidence for progression of the lymphoma. She is in clinical remission from breast cancer.  She will continue Arimidex. She will return for an office and lab visit in 3 months.   Lucile Shutters, MD  07/03/2011  12:51 PM

## 2011-07-30 DIAGNOSIS — B9789 Other viral agents as the cause of diseases classified elsewhere: Secondary | ICD-10-CM | POA: Diagnosis not present

## 2011-07-30 DIAGNOSIS — J209 Acute bronchitis, unspecified: Secondary | ICD-10-CM | POA: Diagnosis not present

## 2011-10-02 DIAGNOSIS — J209 Acute bronchitis, unspecified: Secondary | ICD-10-CM | POA: Diagnosis not present

## 2011-10-06 ENCOUNTER — Other Ambulatory Visit: Payer: Self-pay | Admitting: *Deleted

## 2011-10-06 ENCOUNTER — Telehealth: Payer: Self-pay | Admitting: *Deleted

## 2011-10-06 NOTE — Telephone Encounter (Signed)
Need to go out of town this weekend to [facility] having open heart surgery. Husband asking if Dr. Truett Perna can see her this week prior to trip (current appointment is 3/19)? She still does not have much energy and has "coughing spells" at times. Request forwarded to MD for review.

## 2011-10-07 ENCOUNTER — Telehealth: Payer: Self-pay | Admitting: Oncology

## 2011-10-07 NOTE — Telephone Encounter (Signed)
called pt and r/s appt for 03/19 to 03/14

## 2011-10-08 ENCOUNTER — Ambulatory Visit (HOSPITAL_BASED_OUTPATIENT_CLINIC_OR_DEPARTMENT_OTHER): Payer: Medicare Other | Admitting: Oncology

## 2011-10-08 ENCOUNTER — Other Ambulatory Visit (HOSPITAL_BASED_OUTPATIENT_CLINIC_OR_DEPARTMENT_OTHER): Payer: Medicare Other | Admitting: Lab

## 2011-10-08 VITALS — BP 172/90 | HR 88 | Temp 97.5°F | Ht 62.0 in | Wt 140.5 lb

## 2011-10-08 DIAGNOSIS — C50919 Malignant neoplasm of unspecified site of unspecified female breast: Secondary | ICD-10-CM

## 2011-10-08 DIAGNOSIS — R5381 Other malaise: Secondary | ICD-10-CM

## 2011-10-08 DIAGNOSIS — C8589 Other specified types of non-Hodgkin lymphoma, extranodal and solid organ sites: Secondary | ICD-10-CM

## 2011-10-08 DIAGNOSIS — R5383 Other fatigue: Secondary | ICD-10-CM | POA: Diagnosis not present

## 2011-10-08 LAB — CBC WITH DIFFERENTIAL/PLATELET
Basophils Absolute: 0 10*3/uL (ref 0.0–0.1)
EOS%: 2.2 % (ref 0.0–7.0)
Eosinophils Absolute: 0.1 10*3/uL (ref 0.0–0.5)
HGB: 10.1 g/dL — ABNORMAL LOW (ref 11.6–15.9)
LYMPH%: 10.7 % — ABNORMAL LOW (ref 14.0–49.7)
MCH: 28.1 pg (ref 25.1–34.0)
MCV: 84.1 fL (ref 79.5–101.0)
MONO%: 6.3 % (ref 0.0–14.0)
NEUT#: 3.6 10*3/uL (ref 1.5–6.5)
Platelets: 206 10*3/uL (ref 145–400)
RBC: 3.59 10*6/uL — ABNORMAL LOW (ref 3.70–5.45)

## 2011-10-08 MED ORDER — ANASTROZOLE 1 MG PO TABS: 1.0000 mg | ORAL_TABLET | Freq: Every day | ORAL | Status: DC

## 2011-10-08 NOTE — Progress Notes (Signed)
OFFICE PROGRESS NOTE   INTERVAL HISTORY:   She returns a few days prior to a scheduled visit. She complains of malaise. She denies fever, night sweats, and anorexia. No dyspnea. She had a cough last week and was prescribed an antibiotic by Dr. Tiburcio Pea. The cough has improved. She continues Arimidex. No hot flashes.  Objective:  Vital signs in last 24 hours:  Blood pressure 187/95, pulse 88, temperature 97.5 F (36.4 C), temperature source Oral, height 5\' 2"  (1.575 m), weight 140 lb 8 oz (63.73 kg).    HEENT: Neck without mass Lymphatics: No cervical, supraclavicular, axillary, or inguinal nodes Resp: Lungs clear bilaterally. Cardio: Regular rate and rhythm GI: No hepatosplenomegaly, no mass Vascular: No leg edema   Lab Results:  Lab Results  Component Value Date   WBC 4.5 10/08/2011   HGB 10.1* 10/08/2011   HCT 30.2* 10/08/2011   MCV 84.1 10/08/2011   PLT 206 10/08/2011   ANC 3.6   Medications: I have reviewed the patient's current medications.  Assessment/Plan: 1. Non-Hodgkin lymphoma treated with fludarabine/rituximab, last in July of 2009.  a. Restaging CT 11/24/2010 revealed evidence of progressive lymphoma in the abdomen and pelvis.  b. Initiation of salvage therapy with bendamustine/rituximab 12/04/2010. She completed 3 cycles.  c. Restaging CT 03/03/2011 revealed stable retroperitoneal soft tissue and a marked decrease in the soft tissue thickening associated with dilated loops of small bowel. 2. History of ITP. 3. History of Herpes zoster-maintained on prophylactic acyclovir. 4. Anemia secondary to non-Hodgkin lymphoma, chemotherapy, and a history of autoimmune hemolysis. The hemoglobin is slightly lower today.. 5. Hypertension. 6. Right-hand tremor-followed by [name]. 7. Hearing loss-status post placement of an implanted hearing device by Dr. Jac Canavan. 8. Bilateral breast cancer, right breast cancer-grade 1, T1 N0, ER/PR positive, and HER2 negative. Left breast  cancer, synchronous grade 2, T2 N0, ER/PR positive, and HER2 negative. She began adjuvant Arimidex on 09/15/2010. 9. Fatigue/malaise- 10. Inflammatory changes of both lungs on a CT of the chest 11/24/2010-progressive on the restaging CT 03/03/2011, status post a bronchoscopy 03/17/2011 with no evidence of granulomata or tumor. 11. 11 mm spiculated lesion in the lingula on a CT 11/24/2010-less distinct on the CT 03/03/2011.    Disposition:  She remains in clinical remission from the non-Hodgkin's lymphoma and breast cancer. The malaise may be related to a recent upper respiratory infection. She will contact us for progression of the malaise or new symptoms. She will return for an office and lab visit on 11/26/2011.   Lucile Shutters, MD  10/08/2011  5:14 PM

## 2011-10-09 ENCOUNTER — Telehealth: Payer: Self-pay | Admitting: Oncology

## 2011-10-09 NOTE — Telephone Encounter (Signed)
pt aware of her 5/3 appt    aom

## 2011-10-13 ENCOUNTER — Other Ambulatory Visit: Payer: Medicare Other | Admitting: Lab

## 2011-10-13 ENCOUNTER — Ambulatory Visit: Payer: Medicare Other | Admitting: Oncology

## 2011-10-30 DIAGNOSIS — J209 Acute bronchitis, unspecified: Secondary | ICD-10-CM | POA: Diagnosis not present

## 2011-11-04 ENCOUNTER — Other Ambulatory Visit: Payer: Self-pay | Admitting: *Deleted

## 2011-11-04 DIAGNOSIS — C8589 Other specified types of non-Hodgkin lymphoma, extranodal and solid organ sites: Secondary | ICD-10-CM

## 2011-11-04 MED ORDER — ANASTROZOLE 1 MG PO TABS: 1.0000 mg | ORAL_TABLET | Freq: Every day | ORAL | Status: DC

## 2011-11-13 ENCOUNTER — Other Ambulatory Visit: Payer: Self-pay | Admitting: Oncology

## 2011-11-13 DIAGNOSIS — C8589 Other specified types of non-Hodgkin lymphoma, extranodal and solid organ sites: Secondary | ICD-10-CM

## 2011-11-23 DIAGNOSIS — H2510 Age-related nuclear cataract, unspecified eye: Secondary | ICD-10-CM | POA: Diagnosis not present

## 2011-11-26 ENCOUNTER — Ambulatory Visit: Payer: Medicare Other | Admitting: Oncology

## 2011-11-26 ENCOUNTER — Other Ambulatory Visit: Payer: Medicare Other

## 2011-11-26 ENCOUNTER — Other Ambulatory Visit: Payer: Self-pay | Admitting: *Deleted

## 2011-11-27 ENCOUNTER — Ambulatory Visit (HOSPITAL_BASED_OUTPATIENT_CLINIC_OR_DEPARTMENT_OTHER): Payer: Medicare Other | Admitting: Oncology

## 2011-11-27 ENCOUNTER — Other Ambulatory Visit (HOSPITAL_BASED_OUTPATIENT_CLINIC_OR_DEPARTMENT_OTHER): Payer: Medicare Other | Admitting: Lab

## 2011-11-27 ENCOUNTER — Telehealth: Payer: Self-pay | Admitting: *Deleted

## 2011-11-27 VITALS — BP 149/75 | HR 78 | Temp 97.1°F | Ht 62.0 in | Wt 141.0 lb

## 2011-11-27 DIAGNOSIS — C50919 Malignant neoplasm of unspecified site of unspecified female breast: Secondary | ICD-10-CM | POA: Diagnosis not present

## 2011-11-27 DIAGNOSIS — C8589 Other specified types of non-Hodgkin lymphoma, extranodal and solid organ sites: Secondary | ICD-10-CM

## 2011-11-27 DIAGNOSIS — D630 Anemia in neoplastic disease: Secondary | ICD-10-CM

## 2011-11-27 DIAGNOSIS — D649 Anemia, unspecified: Secondary | ICD-10-CM

## 2011-11-27 DIAGNOSIS — Z79811 Long term (current) use of aromatase inhibitors: Secondary | ICD-10-CM

## 2011-11-27 LAB — COMPREHENSIVE METABOLIC PANEL
AST: 19 U/L (ref 0–37)
Alkaline Phosphatase: 89 U/L (ref 39–117)
BUN: 19 mg/dL (ref 6–23)
Calcium: 9 mg/dL (ref 8.4–10.5)
Creatinine, Ser: 1.1 mg/dL (ref 0.50–1.10)
Total Bilirubin: 0.2 mg/dL — ABNORMAL LOW (ref 0.3–1.2)

## 2011-11-27 LAB — CBC WITH DIFFERENTIAL/PLATELET
BASO%: 1.2 % (ref 0.0–2.0)
EOS%: 2 % (ref 0.0–7.0)
HCT: 32 % — ABNORMAL LOW (ref 34.8–46.6)
LYMPH%: 14.9 % (ref 14.0–49.7)
MCH: 28.8 pg (ref 25.1–34.0)
MCHC: 34.1 g/dL (ref 31.5–36.0)
MONO%: 6.4 % (ref 0.0–14.0)
NEUT%: 75.5 % (ref 38.4–76.8)
Platelets: 117 10*3/uL — ABNORMAL LOW (ref 145–400)
RBC: 3.79 10*6/uL (ref 3.70–5.45)

## 2011-11-27 NOTE — Telephone Encounter (Signed)
gave patient appointment for 02-02-2012 starting at 9:30am printed out calendar and gave to the patient

## 2011-11-27 NOTE — Progress Notes (Signed)
OFFICE PROGRESS NOTE   INTERVAL HISTORY:   She returns as scheduled. She reports a good appetite. No hot flashes or pain. She complains of malaise, intermittent hoarseness, and balance difficulty. The right hand tremor persists.  Objective:  Vital signs in last 24 hours:  Blood pressure 149/75, pulse 78, temperature 97.1 F (36.2 C), temperature source Oral, height 5\' 2"  (1.575 m), weight 141 lb (63.957 kg).    HEENT: Neck without mass, oropharynx without visible mass Lymphatics: No cervical, supraclavicular, axillary, or inguinal nodes Resp: Lungs clear bilaterally Cardio: Regular rate and rhythm, 2/6 systolic murmur GI: No hepatosplenomegaly, no mass, nontender Vascular: No leg edema Neuro: Alert and oriented. Finger to nose testing is normal. The gait is normal. Right-hand tremor Breast: Status post bilateral mastectomy .no evidence for chest wall tumor recurrence.   Lab Results:  Lab Results  Component Value Date   WBC 3.4* 11/27/2011   HGB 10.9* 11/27/2011   HCT 32.0* 11/27/2011   MCV 84.4 11/27/2011   PLT 117* 11/27/2011   ANC 2.6   Medications: I have reviewed the patient's current medications.  Assessment/Plan: 1. Non-Hodgkin lymphoma treated with fludarabine/rituximab, last in July of 2009.  a. Restaging CT 11/24/2010 revealed evidence of progressive lymphoma in the abdomen and pelvis.  b. Initiation of salvage therapy with bendamustine/rituximab 12/04/2010. She completed 3 cycles.  c. Restaging CT 03/03/2011 revealed stable retroperitoneal soft tissue and a marked decrease in the soft tissue thickening associated with dilated loops of small bowel. 2. History of ITP. Intermittent mild thrombocytopenia persists. 3. History of Herpes zoster-maintained on prophylactic acyclovir. 4. Anemia secondary to non-Hodgkin lymphoma, chemotherapy, and a history of autoimmune hemolysis. The hemoglobin is higher today. 5.  Hypertension. 6. Right-hand tremor-followed by Dr.  Sandria Manly. 7. Hearing loss-status post placement of an implanted hearing device by Dr. Jac Canavan. 8. Bilateral breast cancer, right breast cancer-grade 1, T1 N0, ER/PR positive, and HER2 negative. Left breast cancer, synchronous grade 2, T2 N0, ER/PR positive, and HER2 negative. She began adjuvant Arimidex on 09/15/2010. 9. Inflammatory changes of both lungs on a CT of the chest 11/24/2010-progressive on the restaging CT 03/03/2011, status post a bronchoscopy 03/17/2011 with no evidence of granulomata or tumor. 10. 11 mm spiculated lesion in the lingula on a CT 11/24/2010-less distinct on the CT 03/03/2011. 11. Hoarseness-unlikely related to the diagnoses of breast cancer or lymphoma. She plans to schedule an appointment with Dr. Jac Canavan   Disposition:  She remains in clinical remission from lymphoma and breast cancer. She will continue arimidex. She will return problems visit in 2 months.   Thornton Papas, MD  11/27/2011  4:43 PM

## 2012-01-14 DIAGNOSIS — H2510 Age-related nuclear cataract, unspecified eye: Secondary | ICD-10-CM | POA: Diagnosis not present

## 2012-01-14 DIAGNOSIS — H1030 Unspecified acute conjunctivitis, unspecified eye: Secondary | ICD-10-CM | POA: Diagnosis not present

## 2012-01-15 DIAGNOSIS — J13 Pneumonia due to Streptococcus pneumoniae: Secondary | ICD-10-CM | POA: Diagnosis not present

## 2012-01-18 DIAGNOSIS — J13 Pneumonia due to Streptococcus pneumoniae: Secondary | ICD-10-CM | POA: Diagnosis not present

## 2012-01-26 DIAGNOSIS — R5381 Other malaise: Secondary | ICD-10-CM | POA: Diagnosis not present

## 2012-01-26 DIAGNOSIS — R5383 Other fatigue: Secondary | ICD-10-CM | POA: Diagnosis not present

## 2012-01-26 DIAGNOSIS — F329 Major depressive disorder, single episode, unspecified: Secondary | ICD-10-CM | POA: Diagnosis not present

## 2012-01-26 DIAGNOSIS — J189 Pneumonia, unspecified organism: Secondary | ICD-10-CM | POA: Diagnosis not present

## 2012-01-29 ENCOUNTER — Other Ambulatory Visit: Payer: Self-pay | Admitting: *Deleted

## 2012-02-02 ENCOUNTER — Ambulatory Visit (HOSPITAL_BASED_OUTPATIENT_CLINIC_OR_DEPARTMENT_OTHER): Payer: Medicare Other | Admitting: Oncology

## 2012-02-02 ENCOUNTER — Telehealth: Payer: Self-pay | Admitting: Oncology

## 2012-02-02 ENCOUNTER — Other Ambulatory Visit (HOSPITAL_BASED_OUTPATIENT_CLINIC_OR_DEPARTMENT_OTHER): Payer: Medicare Other

## 2012-02-02 VITALS — BP 162/84 | HR 85 | Temp 97.0°F | Ht 62.0 in | Wt 140.1 lb

## 2012-02-02 DIAGNOSIS — R222 Localized swelling, mass and lump, trunk: Secondary | ICD-10-CM | POA: Diagnosis not present

## 2012-02-02 DIAGNOSIS — C50919 Malignant neoplasm of unspecified site of unspecified female breast: Secondary | ICD-10-CM | POA: Diagnosis not present

## 2012-02-02 DIAGNOSIS — D696 Thrombocytopenia, unspecified: Secondary | ICD-10-CM | POA: Diagnosis not present

## 2012-02-02 DIAGNOSIS — C8589 Other specified types of non-Hodgkin lymphoma, extranodal and solid organ sites: Secondary | ICD-10-CM

## 2012-02-02 DIAGNOSIS — D649 Anemia, unspecified: Secondary | ICD-10-CM

## 2012-02-02 LAB — CBC WITH DIFFERENTIAL/PLATELET
Basophils Absolute: 0 10*3/uL (ref 0.0–0.1)
Eosinophils Absolute: 0.1 10*3/uL (ref 0.0–0.5)
HCT: 32.7 % — ABNORMAL LOW (ref 34.8–46.6)
HGB: 10.9 g/dL — ABNORMAL LOW (ref 11.6–15.9)
MCV: 85.4 fL (ref 79.5–101.0)
MONO%: 9.2 % (ref 0.0–14.0)
NEUT#: 2.8 10*3/uL (ref 1.5–6.5)
NEUT%: 70.8 % (ref 38.4–76.8)
RDW: 13.3 % (ref 11.2–14.5)

## 2012-02-02 NOTE — Telephone Encounter (Signed)
appts made and printed for pt aom °

## 2012-02-02 NOTE — Progress Notes (Signed)
   [name]    OFFICE PROGRESS NOTE   INTERVAL HISTORY:   She returns as scheduled. She was diagnosed with an upper respiratory infection while visiting her son in [place] 2-3 weeks ago. She was treated with Levaquin, cough medication, and a steroid Dosepak. The symptoms are resolved.  She has noted malaise over the past few months. There has been some improvement since starting Wellbutrin.  Objective:  Vital signs in last 24 hours:  Blood pressure 162/84, pulse 85, temperature 97 F (36.1 C), temperature source Oral, height 5\' 2"  (1.575 m), weight 140 lb 1.6 oz (63.549 kg).    HEENT: Neck without mass Lymphatics: No cervical, supraclavicular, or axillary nodes Resp: Lungs clear bilaterally Cardio: Regular rate and rhythm GI: No hepatosplenomegaly, no mass Vascular: No leg edema Neuro: Tremor of the right hand      Lab Results:  Lab Results  Component Value Date   WBC 3.9 02/02/2012   HGB 10.9* 02/02/2012   HCT 32.7* 02/02/2012   MCV 85.4 02/02/2012   PLT 141* 02/02/2012   ANC 2.8    Medications: I have reviewed the patient's current medications.  Assessment/Plan: 1. Non-Hodgkin lymphoma treated with fludarabine/rituximab, last in July of 2009.  a. Restaging CT 11/24/2010 revealed evidence of progressive lymphoma in the abdomen and pelvis.  b. Initiation of salvage therapy with bendamustine/rituximab 12/04/2010. She completed 3 cycles.  c. Restaging CT 03/03/2011 revealed stable retroperitoneal soft tissue and a marked decrease in the soft tissue thickening associated with dilated loops of small bowel. 2. History of ITP. Intermittent mild thrombocytopenia persists. 3. History of Herpes zoster-maintained on prophylactic acyclovir. 4. Anemia secondary to non-Hodgkin lymphoma, chemotherapy, and a history of autoimmune hemolysis. The hemoglobin is stable today. 5. Hypertension. 6. Right-hand tremor-followed by [name]. 7. Hearing loss-status post  placement of an implanted hearing device by Dr. Jac Canavan. 8. Bilateral breast cancer, right breast cancer-grade 1, T1 N0, ER/PR positive, and HER2 negative. Left breast cancer, synchronous grade 2, T2 N0, ER/PR positive, and HER2 negative. She began adjuvant Arimidex on 09/15/2010. 9. Inflammatory changes of both lungs on a CT of the chest 11/24/2010-progressive on the restaging CT 03/03/2011, status post a bronchoscopy 03/17/2011 with no evidence of granulomata or tumor. 10. 11 mm spiculated lesion in the lingula on a CT 11/24/2010-less distinct on the CT 03/03/2011. 11. Upper respiratory infection while visiting her son in [place], June 2013, resolved after treatment with Levaquin/steroids   Disposition:  She appears stable. I doubt the malaise is related to the history of lymphoma or breast cancer. She will return for an office and lab visit in 2 months. [name] will contact us in the interim for increased malaise or new symptoms.   Thornton Papas, MD  02/02/2012  6:41 PM

## 2012-02-22 DIAGNOSIS — Z0000 Encounter for general adult medical examination without abnormal findings: Secondary | ICD-10-CM | POA: Diagnosis not present

## 2012-02-22 DIAGNOSIS — Z124 Encounter for screening for malignant neoplasm of cervix: Secondary | ICD-10-CM | POA: Diagnosis not present

## 2012-02-22 DIAGNOSIS — Z01419 Encounter for gynecological examination (general) (routine) without abnormal findings: Secondary | ICD-10-CM | POA: Diagnosis not present

## 2012-04-04 ENCOUNTER — Other Ambulatory Visit (HOSPITAL_BASED_OUTPATIENT_CLINIC_OR_DEPARTMENT_OTHER): Payer: Medicare Other

## 2012-04-04 ENCOUNTER — Ambulatory Visit (HOSPITAL_BASED_OUTPATIENT_CLINIC_OR_DEPARTMENT_OTHER): Payer: Medicare Other | Admitting: Oncology

## 2012-04-04 ENCOUNTER — Telehealth: Payer: Self-pay | Admitting: Oncology

## 2012-04-04 VITALS — BP 146/82 | HR 89 | Temp 97.1°F | Resp 20 | Ht 62.0 in | Wt 134.2 lb

## 2012-04-04 DIAGNOSIS — C50919 Malignant neoplasm of unspecified site of unspecified female breast: Secondary | ICD-10-CM | POA: Diagnosis not present

## 2012-04-04 DIAGNOSIS — R222 Localized swelling, mass and lump, trunk: Secondary | ICD-10-CM | POA: Diagnosis not present

## 2012-04-04 DIAGNOSIS — C8589 Other specified types of non-Hodgkin lymphoma, extranodal and solid organ sites: Secondary | ICD-10-CM | POA: Diagnosis not present

## 2012-04-04 LAB — COMPREHENSIVE METABOLIC PANEL (CC13)
BUN: 19 mg/dL (ref 7.0–26.0)
CO2: 27 mEq/L (ref 22–29)
Calcium: 9.3 mg/dL (ref 8.4–10.4)
Chloride: 105 mEq/L (ref 98–107)
Creatinine: 1.2 mg/dL — ABNORMAL HIGH (ref 0.6–1.1)

## 2012-04-04 LAB — CBC WITH DIFFERENTIAL/PLATELET
Eosinophils Absolute: 0.1 10*3/uL (ref 0.0–0.5)
HCT: 35.9 % (ref 34.8–46.6)
LYMPH%: 13.2 % — ABNORMAL LOW (ref 14.0–49.7)
MCHC: 34 g/dL (ref 31.5–36.0)
MONO#: 0.3 10*3/uL (ref 0.1–0.9)
NEUT#: 3.8 10*3/uL (ref 1.5–6.5)
NEUT%: 76.9 % — ABNORMAL HIGH (ref 38.4–76.8)
Platelets: 149 10*3/uL (ref 145–400)
WBC: 4.9 10*3/uL (ref 3.9–10.3)

## 2012-04-04 NOTE — Telephone Encounter (Signed)
Date of appt on November 2013 per pt request

## 2012-04-04 NOTE — Progress Notes (Signed)
   [facility]    OFFICE PROGRESS NOTE   INTERVAL HISTORY:   She returns as scheduled. She reports an improved energy level since starting the Wellbutrin. Good appetite. She is exercising regularly. No shortness of breath. No change at the chest wall. Stable right hand tremor. Mild balance difficulty.  Objective:  Vital signs in last 24 hours:  Blood pressure 146/82, pulse 89, temperature 97.1 F (36.2 C), temperature source Oral, resp. rate 20, height 5\' 2"  (1.575 m), weight 134 lb 3.2 oz (60.873 kg).    HEENT: Neck without mass Lymphatics: No cervical, supraclavicular, or axillary nodes Resp: Bilateral inspiratory bronchial sounds, good air movement bilaterally, no respiratory distress Cardio: Regular rate and rhythm GI: No hepatosplenic the, no mass, nontender Vascular: No leg edema Neuro: Finger to nose testing is normal  Skin: Status post bilateral mastectomy. No evidence for chest wall tumor recurrence     Lab Results:  Lab Results  Component Value Date   WBC 4.9 04/04/2012   HGB 12.2 04/04/2012   HCT 35.9 04/04/2012   MCV 85.1 04/04/2012   PLT 149 04/04/2012   ANC 3.8    Medications: I have reviewed the patient's current medications.  Assessment/Plan: 1. Non-Hodgkin lymphoma treated with fludarabine/rituximab, last in July of 2009.  a. Restaging CT 11/24/2010 revealed evidence of progressive lymphoma in the abdomen and pelvis.  b. Initiation of salvage therapy with bendamustine/rituximab 12/04/2010. She completed 3 cycles.  c. Restaging CT 03/03/2011 revealed stable retroperitoneal soft tissue and a marked decrease in the soft tissue thickening associated with dilated loops of small bowel. 2. History of ITP. Intermittent mild thrombocytopenia persists. 3. History of Herpes zoster-maintained on prophylactic acyclovir. 4. Anemia secondary to non-Hodgkin lymphoma, chemotherapy, and a history of autoimmune hemolysis. The hemoglobin is stable  today. 5. Hypertension. 6. Right-hand tremor-followed by [name]. 7. Hearing loss-status post placement of an implanted hearing device by Dr. Jac Canavan. 8. Bilateral breast cancer, right breast cancer-grade 1, T1 N0, ER/PR positive, and HER2 negative. Left breast cancer, synchronous grade 2, T2 N0, ER/PR positive, and HER2 negative. She began adjuvant Arimidex on 09/15/2010. 9. Inflammatory changes of both lungs on a CT of the chest 11/24/2010-progressive on the restaging CT 03/03/2011, status post a bronchoscopy 03/17/2011 with no evidence of granulomata or tumor. 10. 11 mm spiculated lesion in the lingula on a CT 11/24/2010-less distinct on the CT 03/03/2011. 11. Upper respiratory infection while visiting her son in [place], June 2013, resolved after treatment with Levaquin/steroids  Disposition:  She remains in clinical remission from breast cancer. She will continue Arimidex. There is no evidence for progression of the non-Hodgkin's lymphoma. Ms. Brickner will return for an office and lab visit in 2 months.   Thornton Papas, MD  04/04/2012  3:34 PM

## 2012-04-04 NOTE — Telephone Encounter (Signed)
Gave pt appt for November 2013 lab and MD °

## 2012-05-08 ENCOUNTER — Emergency Department (HOSPITAL_COMMUNITY): Payer: Medicare Other

## 2012-05-08 ENCOUNTER — Emergency Department (HOSPITAL_COMMUNITY)
Admission: EM | Admit: 2012-05-08 | Discharge: 2012-05-08 | Disposition: A | Discharge status: Home / Self Care | Payer: Medicare Other | Attending: Emergency Medicine | Admitting: Emergency Medicine

## 2012-05-08 DIAGNOSIS — S199XXA Unspecified injury of neck, initial encounter: Secondary | ICD-10-CM | POA: Diagnosis not present

## 2012-05-08 DIAGNOSIS — W1809XA Striking against other object with subsequent fall, initial encounter: Secondary | ICD-10-CM | POA: Insufficient documentation

## 2012-05-08 DIAGNOSIS — R011 Cardiac murmur, unspecified: Secondary | ICD-10-CM | POA: Insufficient documentation

## 2012-05-08 DIAGNOSIS — S0120XA Unspecified open wound of nose, initial encounter: Secondary | ICD-10-CM | POA: Diagnosis not present

## 2012-05-08 DIAGNOSIS — IMO0002 Reserved for concepts with insufficient information to code with codable children: Secondary | ICD-10-CM | POA: Diagnosis not present

## 2012-05-08 DIAGNOSIS — R51 Headache: Secondary | ICD-10-CM | POA: Insufficient documentation

## 2012-05-08 DIAGNOSIS — S0993XA Unspecified injury of face, initial encounter: Secondary | ICD-10-CM | POA: Diagnosis not present

## 2012-05-08 DIAGNOSIS — W19XXXA Unspecified fall, initial encounter: Secondary | ICD-10-CM

## 2012-05-08 DIAGNOSIS — S01501A Unspecified open wound of lip, initial encounter: Secondary | ICD-10-CM | POA: Diagnosis not present

## 2012-05-08 DIAGNOSIS — T1490XA Injury, unspecified, initial encounter: Secondary | ICD-10-CM | POA: Diagnosis not present

## 2012-05-08 DIAGNOSIS — S0180XA Unspecified open wound of other part of head, initial encounter: Secondary | ICD-10-CM | POA: Diagnosis not present

## 2012-05-08 DIAGNOSIS — S0190XA Unspecified open wound of unspecified part of head, initial encounter: Secondary | ICD-10-CM | POA: Diagnosis not present

## 2012-05-08 MED ORDER — LIDOCAINE-EPINEPHRINE-TETRACAINE (LET) SOLUTION
3.0000 mL | Freq: Once | NASAL | Status: AC
  Administered 2012-05-08: 3 mL via TOPICAL
  Filled 2012-05-08: qty 3

## 2012-05-08 MED ORDER — LIDOCAINE-EPINEPHRINE (PF) 1 %-1:200000 IJ SOLN
INTRAMUSCULAR | Status: AC
  Administered 2012-05-08: 08:00:00
  Filled 2012-05-08: qty 10

## 2012-05-08 NOTE — ED Provider Notes (Signed)
History     CSN: [id number]  Arrival date & time 05/08/12  0414   First MD Initiated Contact with Patient 05/08/12 0416      Chief Complaint  Patient presents with  . Fall     HPI  The patient presents after a seemingly mechanical fall.  She is amnestic to the event, but states that she may have been sleep-walking. Since the event there has been pain diffusely in her head, as well as focally in her nose and upper lip.  She initially had significant bleeding from each of these areas, but this has largely stopped by ED arrival.   She denies any post-fall cp/dyspnea/additional LH, unilateral weakness, visual changes, or other focal complaints.    Past Medical History  Diagnosis Date  . NHL (non-Hodgkin's lymphoma)     nhl dx 9/04 breast ca dx1/12  . Asthma     Past Surgical History  Procedure Date  . Mastectomy 2 /8/ 12    bilateral    Family History  Problem Relation Age of Onset  . Lung cancer Father     History  Substance Use Topics  . Smoking status: Never Smoker   . Smokeless tobacco: Not on file   Comment: husband wsa smoker  . Alcohol Use: No    OB History    Grav Para Term Preterm Abortions TAB SAB Ect Mult Living                  Review of Systems  Constitutional:       HPI  HENT:       HPI otherwise negative  Eyes: Negative.   Respiratory:       HPI, otherwise negative  Cardiovascular:       HPI, otherwise nmegative  Gastrointestinal: Negative for nausea and vomiting.  Genitourinary:       HPI, otherwise negative  Musculoskeletal:       HPI, otherwise negative  Skin: Positive for wound.  Neurological: Negative for dizziness, syncope, facial asymmetry, weakness, light-headedness, numbness and headaches.    Allergies  Review of patient's allergies indicates no known allergies.  Home Medications   Current Outpatient Rx  Name Route Sig Dispense Refill  . ACYCLOVIR 400 MG PO TABS  TAKE 1 TABLET BY MOUTH TWICE A DAY 60 tablet 5  .  ANASTROZOLE 1 MG PO TABS Oral Take 1 tablet (1 mg total) by mouth daily. 90 tablet 3  . BUPROPION HCL ER (XL) 150 MG PO TB24 Oral Take 300 mg by mouth daily.     [name] CALCIUM 600 + D PO Oral Take 1 capsule by mouth daily.     [name] CRANBERRY EXTRACT PO Oral Take 1 tablet by mouth daily.      [name] DARIFENACIN HYDROBROMIDE ER 7.5 MG PO TB24 Oral Take 7.5 mg by mouth daily.     [name] FLUTICASONE-SALMETEROL 100-50 MCG/DOSE IN AEPB Inhalation Inhale 1 puff into the lungs every 12 (twelve) hours.      [name] LORATADINE 10 MG PO TABS Oral Take 10 mg by mouth daily. For runny nose as needed    . MULTI-VITAMIN/MINERALS PO TABS Oral Take 1 tablet by mouth daily.      . XYLITOL 500 MG MT DISK Mouth/Throat Use as directed in the mouth or throat. Uses 2 disc as needed for dry mouth      BP 214/104  Pulse 85  Temp 98.4 F (36.9 C) (Oral)  Resp 18  SpO2 99%  Physical Exam  Nursing note and vitals reviewed. Constitutional: She is oriented to person, place, and time. She appears well-developed and well-nourished. No distress.  HENT:  Head: Normocephalic and atraumatic. Head is without raccoon's eyes and without Battle's sign.  Nose: Nose lacerations, sinus tenderness and nasal deformity present. No septal deviation or nasal septal hematoma. No epistaxis.    Mouth/Throat: Uvula is midline and oropharynx is clear and moist.       Dental bridges intact.  No gross oropharyngeal lesions.  Eyes: Conjunctivae normal and EOM are normal. Pupils are equal, round, and reactive to light.  Neck: No spinous process tenderness and no muscular tenderness present. No rigidity. No edema, no erythema and normal range of motion present.  Cardiovascular: Normal rate and regular rhythm.   Murmur heard. Pulmonary/Chest: Effort normal and breath sounds normal. No stridor. No respiratory distress.  Abdominal: She exhibits no distension.  Musculoskeletal: She exhibits no edema.       Superficial abrasion to the L elbow.  No TTP, no restricted  ROM.  Neurological: She is alert and oriented to person, place, and time. No cranial nerve deficit.  Skin: Skin is warm and dry.  Psychiatric: She has a normal mood and affect.    ED Course  Procedures (including critical care time)   Labs Reviewed  CBC WITH DIFFERENTIAL  URINALYSIS, ROUTINE W REFLEX MICROSCOPIC  COMPREHENSIVE METABOLIC PANEL   No results found.   No diagnosis found.  O2- 99%ra, normal  LACERATION REPAIR Performed by: Gerhard Munch Authorized by: Gerhard Munch Consent: Verbal consent obtained. Risks and benefits: risks, benefits and alternatives were discussed Consent given by: patient Patient identity confirmed: provided demographic data Prepped and Draped in normal sterile fashion Wound explored  Laceration Location: superior lip w Dorita Fray border involvement  Laceration Length: 3cm  No Foreign Bodies seen or palpated  Anesthesia: local infiltration  Local anesthetic:LET  Anesthetic total: 10 ml  Irrigation method: syringe Amount of cleaning: standard  Skin closure: 6-0 nylon Number of sutures: 2  Technique: simple interupted  Patient tolerance: Patient tolerated the procedure well with no immediate complications.   MDM  This pleasant elderly F now presents following a fall, to which she is amnestic.  Given the facial trauma, and the uncertainty regarding the fall the patient had CT eval.  (However, with her denial of CP, or any other focal complaints beyond facial pain following the fall there is low-suspicion for acute ongoing dysrhythmia or infectious etiology).  CT scans were largely reassuring, and following lac repair the patient was discharged in stable condition (after a discussion on return precautions and the need for appropriate follow-up care.)    Gerhard Munch, MD 05/09/12 [phone]

## 2012-05-08 NOTE — ED Notes (Signed)
QIO:NG29<BM> Expected date:<BR> Expected time:<BR> Means of arrival:<BR> Comments:<BR> Rm 21, Medic 231, Fall, Laceration

## 2012-05-08 NOTE — ED Notes (Signed)
Pt put tissue in urine specimen.  Will try again in 30 minutes.

## 2012-05-08 NOTE — ED Notes (Signed)
Per EMS, pt. Is from home who fell and hit her face on the counter, denies LOC. Pt. Is alert and oriented x 3. , claimed that she took her Ambien pill at 1 am today and woke up to get something. Pt. Acquired lacerations on left upper lip and left nare.

## 2012-05-13 ENCOUNTER — Encounter (HOSPITAL_COMMUNITY): Payer: Self-pay | Admitting: *Deleted

## 2012-05-13 ENCOUNTER — Emergency Department (HOSPITAL_COMMUNITY)
Admission: EM | Admit: 2012-05-13 | Discharge: 2012-05-13 | Disposition: A | Discharge status: Home / Self Care | Payer: Medicare Other | Attending: Emergency Medicine | Admitting: Emergency Medicine

## 2012-05-13 DIAGNOSIS — Z4802 Encounter for removal of sutures: Secondary | ICD-10-CM | POA: Diagnosis not present

## 2012-05-13 DIAGNOSIS — J45909 Unspecified asthma, uncomplicated: Secondary | ICD-10-CM | POA: Diagnosis not present

## 2012-05-13 NOTE — ED Notes (Signed)
Pt reports falling this past Sunday morning and had 2 stitches placed in L upper lip. Denies pain. No s/sx infection.

## 2012-05-13 NOTE — ED Provider Notes (Signed)
History     CSN: [id number]  Arrival date & time 05/13/12  1134   First MD Initiated Contact with Patient 05/13/12 1251      Chief Complaint  Patient presents with  . Suture / Staple Removal    (Consider location/radiation/quality/duration/timing/severity/associated sxs/prior treatment) Patient is a 78 y.o. female presenting with suture removal.  Suture / Staple Removal  The sutures were placed 3 to 6 days ago. Treatments since wound repair include antibiotic ointment use. Fever duration: no fever. There has been no drainage from the wound. There is no redness present. There is no swelling present. The pain has no pain. She has no difficulty moving the affected extremity or digit.    Past Medical History  Diagnosis Date  . NHL (non-Hodgkin's lymphoma)     nhl dx 9/04 breast ca dx1/12  . Asthma     Past Surgical History  Procedure Date  . Mastectomy 2 /8/ 12    bilateral    Family History  Problem Relation Age of Onset  . Lung cancer Father     History  Substance Use Topics  . Smoking status: Never Smoker   . Smokeless tobacco: Not on file   Comment: husband wsa smoker  . Alcohol Use: No    OB History    Grav Para Term Preterm Abortions TAB SAB Ect Mult Living                  Review of Systems  Constitutional: Negative for fever, diaphoresis and activity change.  HENT: Negative for congestion and neck pain.   Respiratory: Negative for cough.   Genitourinary: Negative for dysuria.  Musculoskeletal: Negative for myalgias.  Skin: Negative for color change and wound.  Neurological: Negative for headaches.  All other systems reviewed and are negative.    Allergies  Review of patient's allergies indicates no known allergies.  Home Medications   Current Outpatient Rx  Name Route Sig Dispense Refill  . ACYCLOVIR 400 MG PO TABS Oral Take 400 mg by mouth 2 (two) times daily.    . ANASTROZOLE 1 MG PO TABS Oral Take 1 tablet (1 mg total) by mouth daily. 90  tablet 3  . BUPROPION HCL ER (XL) 150 MG PO TB24 Oral Take 150 mg by mouth daily.     [name] CALCIUM 600 + D PO Oral Take 1 capsule by mouth daily.     [name] CRANBERRY EXTRACT PO Oral Take 1 tablet by mouth daily.      [name] DARIFENACIN HYDROBROMIDE ER 7.5 MG PO TB24 Oral Take 7.5 mg by mouth daily.     [name] FLUTICASONE-SALMETEROL 100-50 MCG/DOSE IN AEPB Inhalation Inhale 1 puff into the lungs every 12 (twelve) hours.     [name] LORATADINE 10 MG PO TABS Oral Take 10 mg by mouth daily. For runny nose as needed    . MULTI-VITAMIN/MINERALS PO TABS Oral Take 1 tablet by mouth daily.      . XYLITOL 500 MG MT DISK Mouth/Throat Use as directed in the mouth or throat. Uses 2 disc as needed for dry mouth      BP 157/76  Pulse 85  Temp 97.7 F (36.5 C) (Oral)  Resp 14  SpO2 100%  Physical Exam  Constitutional: She appears well-developed and well-nourished. No distress.  HENT:  Head: Normocephalic.  Eyes: Conjunctivae normal and EOM are normal. Pupils are equal, round, and reactive to light.  Neck: Normal range of motion. Neck supple.  Cardiovascular: Normal rate.  Intact distal pulses  Pulmonary/Chest: Effort normal.  Skin: Skin is dry. She is not diaphoretic.       Wound healing appropriately with good approximation. No surrounding warmth or erythema. No purulent drainage. Mild ttp along scar line.    ED Course  SUTURE REMOVAL Date/Time: 05/13/2012 1:20 PM Performed by: Jaci Carrel Authorized by: Jaci Carrel Consent: Verbal consent obtained. Consent given by: patient Patient understanding: patient states understanding of the procedure being performed Patient consent: the patient's understanding of the procedure matches consent given Patient identity confirmed: verbally with patient Body area: mouth Wound Appearance: clean Sutures Removed: 2 Patient tolerance: Patient tolerated the procedure well with no immediate complications.   (including critical care time)  Labs Reviewed - No data to  display No results found.   1. Visit for suture removal       MDM  Wound check  2 sutures removed, pt with good healing. Scar minimization discussed.  At this time there does not appear to be any evidence of an acute emergency medical condition and the patient appears stable for discharge with appropriate outpatient follow up.Diagnosis was discussed with patient who verbalizes understanding and is agreeable to discharge.     Jaci Carrel, [place] 05/13/12 1321

## 2012-05-13 NOTE — ED Provider Notes (Signed)
Medical screening examination/treatment/procedure(s) were performed by non-physician practitioner and as supervising physician I was immediately available for consultation/collaboration.   Offie Pickron Y. Lucia Harm, MD 05/13/12 1347 

## 2012-06-01 ENCOUNTER — Encounter (INDEPENDENT_AMBULATORY_CARE_PROVIDER_SITE_OTHER): Payer: Self-pay | Admitting: Surgery

## 2012-06-01 ENCOUNTER — Ambulatory Visit (INDEPENDENT_AMBULATORY_CARE_PROVIDER_SITE_OTHER): Payer: Medicare Other | Admitting: Surgery

## 2012-06-01 VITALS — BP 148/86 | HR 70 | Temp 97.8°F | Resp 16 | Ht 63.0 in | Wt 133.5 lb

## 2012-06-01 DIAGNOSIS — C50919 Malignant neoplasm of unspecified site of unspecified female breast: Secondary | ICD-10-CM | POA: Diagnosis not present

## 2012-06-01 NOTE — Progress Notes (Signed)
Long-term followup of bilateral breast cancer status post bilateral mastectomies and bilateral sentinel lymph node biopsies in favor of 2012 the patient is being followed by Dr. Truett Perna for her lymphoma and her breast cancer. She is currently on  Arimidex and is tolerating it well.    On physical examination, both chest incisions are well-healed with no sign of any palpable nodules. No underlying seroma. No axillary lymphadenopathy on either side.  Status post bilateral breast cancers with bilateral mastectomies and sentinel lymph node biopsies. Continue treatment with Dr. Truett Perna. We will see her as needed.  Wilmon Arms. Corliss Skains, MD, [facility] Surgery  06/01/2012 5:20 PM

## 2012-06-13 ENCOUNTER — Other Ambulatory Visit (HOSPITAL_BASED_OUTPATIENT_CLINIC_OR_DEPARTMENT_OTHER): Payer: Medicare Other

## 2012-06-13 ENCOUNTER — Telehealth: Payer: Self-pay | Admitting: Oncology

## 2012-06-13 ENCOUNTER — Ambulatory Visit (HOSPITAL_BASED_OUTPATIENT_CLINIC_OR_DEPARTMENT_OTHER): Payer: Medicare Other | Admitting: Oncology

## 2012-06-13 VITALS — BP 186/90 | HR 77 | Temp 98.2°F | Resp 18 | Ht 63.0 in | Wt 135.6 lb

## 2012-06-13 DIAGNOSIS — Z23 Encounter for immunization: Secondary | ICD-10-CM | POA: Diagnosis not present

## 2012-06-13 DIAGNOSIS — D630 Anemia in neoplastic disease: Secondary | ICD-10-CM

## 2012-06-13 DIAGNOSIS — C50919 Malignant neoplasm of unspecified site of unspecified female breast: Secondary | ICD-10-CM | POA: Diagnosis not present

## 2012-06-13 DIAGNOSIS — C50419 Malignant neoplasm of upper-outer quadrant of unspecified female breast: Secondary | ICD-10-CM

## 2012-06-13 DIAGNOSIS — C8589 Other specified types of non-Hodgkin lymphoma, extranodal and solid organ sites: Secondary | ICD-10-CM

## 2012-06-13 LAB — CBC WITH DIFFERENTIAL/PLATELET
BASO%: 0.5 % (ref 0.0–2.0)
Eosinophils Absolute: 0.1 10*3/uL (ref 0.0–0.5)
MCHC: 33.2 g/dL (ref 31.5–36.0)
MCV: 86 fL (ref 79.5–101.0)
MONO%: 7.4 % (ref 0.0–14.0)
NEUT#: 3 10*3/uL (ref 1.5–6.5)
RBC: 4.13 10*6/uL (ref 3.70–5.45)
RDW: 12.8 % (ref 11.2–14.5)
WBC: 4.2 10*3/uL (ref 3.9–10.3)
nRBC: 0 % (ref 0–0)

## 2012-06-13 LAB — COMPREHENSIVE METABOLIC PANEL (CC13)
ALT: 20 U/L (ref 0–55)
AST: 20 U/L (ref 5–34)
Alkaline Phosphatase: 97 U/L (ref 40–150)
CO2: 30 mEq/L — ABNORMAL HIGH (ref 22–29)
Sodium: 141 mEq/L (ref 136–145)
Total Bilirubin: 0.48 mg/dL (ref 0.20–1.20)
Total Protein: 5.9 g/dL — ABNORMAL LOW (ref 6.4–8.3)

## 2012-06-13 MED ORDER — INFLUENZA VIRUS VACC SPLIT PF IM SUSP
0.5000 mL | Freq: Once | INTRAMUSCULAR | Status: AC
  Administered 2012-06-13: 0.5 mL via INTRAMUSCULAR
  Filled 2012-06-13: qty 0.5

## 2012-06-13 NOTE — Progress Notes (Signed)
    [facility]    OFFICE PROGRESS NOTE   INTERVAL HISTORY:   She returns as scheduled. Good appetite. She continues to have difficulty with balance. Stable right hand tremor. She is walking for exercise. No hot flashes or arthralgias.  She recently had an episode of sleep walking and an unusual dream after taking Ambien.  Objective:  Vital signs in last 24 hours:  Blood pressure 186/90, pulse 77, temperature 98.2 F (36.8 C), temperature source Oral, resp. rate 18, height 5\' 3"  (1.6 m), weight 135 lb 9.6 oz (61.508 kg).    HEENT: Neck without mass Lymphatics: No cervical, supraclavicular, axillary, or inguinal nodes Resp: Lungs clear bilaterally Cardio: Regular rate and rhythm, 2/6 systolic murmur GI: No hepatomegaly, no mass Vascular: No leg edema=  Skin: Status post bilateral mastectomy. No evidence for chest wall tumor recurrence.  Neurologic: Alert and oriented, finger to nose testing is normal  Portacath/PICC-without erythema  Lab Results:  Lab Results  Component Value Date   WBC 4.2 06/13/2012   HGB 11.8 06/13/2012   HCT 35.5 06/13/2012   MCV 86.0 06/13/2012   PLT 136* 06/13/2012   ANC 3.0 BUN 15, creatinine 1.1, calcium 9.6, albumin 3.7  Medications: I have reviewed the patient's current medications.  Assessment/Plan: 1. Non-Hodgkin lymphoma treated with fludarabine/rituximab, last in July of 2009.  a. Restaging CT 11/24/2010 revealed evidence of progressive lymphoma in the abdomen and pelvis.  b. Initiation of salvage therapy with bendamustine/rituximab 12/04/2010. She completed 3 cycles.  c. Restaging CT 03/03/2011 revealed stable retroperitoneal soft tissue and a marked decrease in the soft tissue thickening associated with dilated loops of small bowel. 2. History of ITP. Intermittent mild thrombocytopenia persists. 3. History of Herpes zoster-maintained on prophylactic acyclovir. 4. Anemia secondary to non-Hodgkin lymphoma, chemotherapy,  and a history of autoimmune hemolysis. The hemoglobin is stable today. 5. Hypertension. 6. Right-hand tremor-followed by [name]. 7. Hearing loss-status post placement of an implanted hearing device by Dr. Jac Canavan. 8. Bilateral breast cancer, right breast cancer-grade 1, T1 N0, ER/PR positive, and HER2 negative. Left breast cancer, synchronous grade 2, T2 N0, ER/PR positive, and HER2 negative. She began adjuvant Arimidex on 09/15/2010. 9. Inflammatory changes of both lungs on a CT of the chest 11/24/2010-progressive on the restaging CT 03/03/2011, status post a bronchoscopy 03/17/2011 with no evidence of granulomata or tumor. 10. 11 mm spiculated lesion in the lingula on a CT 11/24/2010-less distinct on the CT 03/03/2011. 11. Upper respiratory infection while visiting her son in [place], June 2013, resolved after treatment with Levaquin/steroids  Disposition:  She appears stable. She remains in clinical remission from non-Hodgkin's lipoma and breast cancer.  Ms. Finfrock will return for an office and lab visit in 3 months.  She continues followup with [name] for the tremor.   Thornton Papas, MD  06/13/2012  1:55 PM

## 2012-06-13 NOTE — Telephone Encounter (Signed)
Gave pt appt for lab and md for February 2014 lab and MD

## 2012-06-16 DIAGNOSIS — H2510 Age-related nuclear cataract, unspecified eye: Secondary | ICD-10-CM | POA: Diagnosis not present

## 2012-08-26 ENCOUNTER — Ambulatory Visit: Payer: Medicare Other | Attending: Family Medicine | Admitting: Physical Therapy

## 2012-08-30 DIAGNOSIS — H908 Mixed conductive and sensorineural hearing loss, unspecified: Secondary | ICD-10-CM | POA: Diagnosis not present

## 2012-09-06 DIAGNOSIS — G20 Parkinson's disease: Secondary | ICD-10-CM | POA: Diagnosis not present

## 2012-09-13 ENCOUNTER — Ambulatory Visit (HOSPITAL_BASED_OUTPATIENT_CLINIC_OR_DEPARTMENT_OTHER): Payer: Medicare Other | Admitting: Oncology

## 2012-09-13 ENCOUNTER — Telehealth: Payer: Self-pay | Admitting: Oncology

## 2012-09-13 ENCOUNTER — Other Ambulatory Visit (HOSPITAL_BASED_OUTPATIENT_CLINIC_OR_DEPARTMENT_OTHER): Payer: Medicare Other | Admitting: Lab

## 2012-09-13 VITALS — BP 139/69 | HR 79 | Temp 97.8°F | Resp 18 | Ht 63.0 in | Wt 136.4 lb

## 2012-09-13 DIAGNOSIS — C50919 Malignant neoplasm of unspecified site of unspecified female breast: Secondary | ICD-10-CM

## 2012-09-13 DIAGNOSIS — T451X5A Adverse effect of antineoplastic and immunosuppressive drugs, initial encounter: Secondary | ICD-10-CM | POA: Diagnosis not present

## 2012-09-13 DIAGNOSIS — R259 Unspecified abnormal involuntary movements: Secondary | ICD-10-CM

## 2012-09-13 DIAGNOSIS — D6481 Anemia due to antineoplastic chemotherapy: Secondary | ICD-10-CM | POA: Diagnosis not present

## 2012-09-13 DIAGNOSIS — C8589 Other specified types of non-Hodgkin lymphoma, extranodal and solid organ sites: Secondary | ICD-10-CM

## 2012-09-13 LAB — COMPREHENSIVE METABOLIC PANEL (CC13)
ALT: 18 U/L (ref 0–55)
AST: 19 U/L (ref 5–34)
BUN: 20.1 mg/dL (ref 7.0–26.0)
Calcium: 9.3 mg/dL (ref 8.4–10.4)
Creatinine: 1.2 mg/dL — ABNORMAL HIGH (ref 0.6–1.1)
Total Bilirubin: 0.41 mg/dL (ref 0.20–1.20)

## 2012-09-13 LAB — CBC WITH DIFFERENTIAL/PLATELET
BASO%: 1 % (ref 0.0–2.0)
Basophils Absolute: 0 10e3/uL (ref 0.0–0.1)
EOS%: 2.8 % (ref 0.0–7.0)
Eosinophils Absolute: 0.1 10e3/uL (ref 0.0–0.5)
HCT: 35.9 % (ref 34.8–46.6)
HGB: 12.1 g/dL (ref 11.6–15.9)
LYMPH%: 15.1 % (ref 14.0–49.7)
MCH: 29.4 pg (ref 25.1–34.0)
MCHC: 33.7 g/dL (ref 31.5–36.0)
MCV: 87.1 fL (ref 79.5–101.0)
MONO#: 0.3 10e3/uL (ref 0.1–0.9)
MONO%: 8.3 % (ref 0.0–14.0)
NEUT#: 2.9 10e3/uL (ref 1.5–6.5)
NEUT%: 72.8 % (ref 38.4–76.8)
Platelets: 139 10e3/uL — ABNORMAL LOW (ref 145–400)
RBC: 4.12 10e6/uL (ref 3.70–5.45)
RDW: 12.6 % (ref 11.2–14.5)
WBC: 4 10e3/uL (ref 3.9–10.3)
lymph#: 0.6 10e3/uL — ABNORMAL LOW (ref 0.9–3.3)
nRBC: 0 % (ref 0–0)

## 2012-09-13 NOTE — Telephone Encounter (Signed)
Gave pt appt for lab and MD on June 2014  °

## 2012-09-13 NOTE — Progress Notes (Signed)
   [facility]    OFFICE PROGRESS NOTE   INTERVAL HISTORY:   She returns as scheduled. She continues to have an intermittent tremor and ataxia. She has been referred for physical therapy. Good appetite. No pain.  Objective:  Vital signs in last 24 hours:  Blood pressure 139/69, pulse 79, temperature 97.8 F (36.6 C), temperature source Oral, resp. rate 18, height 5\' 3"  (1.6 m), weight 136 lb 6.4 oz (61.871 kg).    HEENT: Neck without mass Lymphatics: No cervical, supra-clavicular, or axillary nodes Resp: Lungs clear bilaterally Cardio: Regular rate and rhythm GI: No hepatomegaly, no mass Vascular: No leg edema Neuro: Resting tremor of the right and left hand  Skin: Status post bilateral mastectomy, no evidence for chest wall tumor recurrence    Lab Results:  Lab Results  Component Value Date   WBC 4.0 09/13/2012   HGB 12.1 09/13/2012   HCT 35.9 09/13/2012   MCV 87.1 09/13/2012   PLT 139* 09/13/2012   ANC 2.9 Calcium 9.3, albumin 3.5   Medications: I have reviewed the patient's current medications.  Assessment/Plan: 1. Non-Hodgkin lymphoma treated with fludarabine/rituximab, last in July of 2009.  a. Restaging CT 11/24/2010 revealed evidence of progressive lymphoma in the abdomen and pelvis.  b. Initiation of salvage therapy with bendamustine/rituximab 12/04/2010. She completed 3 cycles.  c. Restaging CT 03/03/2011 revealed stable retroperitoneal soft tissue and a marked decrease in the soft tissue thickening associated with dilated loops of small bowel. 2. History of ITP. Intermittent mild thrombocytopenia persists. 3. History of Herpes zoster-maintained on prophylactic acyclovir. 4. Anemia secondary to non-Hodgkin lymphoma, chemotherapy, and a history of autoimmune hemolysis. The hemoglobin is stable today. 5. Hypertension. 6. Right-hand tremor/ataxia-followed by [name]. 7. Hearing loss-status post placement of an implanted hearing device by Dr.  Jac Canavan. 8. Bilateral breast cancer, right breast cancer-grade 1, T1 N0, ER/PR positive, and HER2 negative. Left breast cancer, synchronous grade 2, T2 N0, ER/PR positive, and HER2 negative. She began adjuvant Arimidex on 09/15/2010. 9. Inflammatory changes of both lungs on a CT of the chest 11/24/2010-progressive on the restaging CT 03/03/2011, status post a bronchoscopy 03/17/2011 with no evidence of granulomata or tumor. 10. 11 mm spiculated lesion in the lingula on a CT 11/24/2010-less distinct on the CT 03/03/2011. 11. Upper respiratory infection while visiting her son in [place], June 2013, resolved after treatment with Levaquin/steroids  Disposition:  No evidence of progressive lymphoma. She remains in clinical remission from breast cancer and will continue Arimidex.  Ms. Loyola will return for an office and lab visit in 3 months.  She has been referred to Dr. Arlana Pouch for ongoing evaluation of the tremor/ataxia.   Thornton Papas, MD  09/13/2012  2:06 PM

## 2012-09-16 ENCOUNTER — Ambulatory Visit: Payer: Medicare Other | Attending: Neurology | Admitting: Physical Therapy

## 2012-09-16 DIAGNOSIS — G20 Parkinson's disease: Secondary | ICD-10-CM | POA: Diagnosis not present

## 2012-09-16 DIAGNOSIS — Z5189 Encounter for other specified aftercare: Secondary | ICD-10-CM | POA: Insufficient documentation

## 2012-09-16 DIAGNOSIS — R471 Dysarthria and anarthria: Secondary | ICD-10-CM | POA: Insufficient documentation

## 2012-09-16 DIAGNOSIS — R41841 Cognitive communication deficit: Secondary | ICD-10-CM | POA: Insufficient documentation

## 2012-09-16 DIAGNOSIS — R269 Unspecified abnormalities of gait and mobility: Secondary | ICD-10-CM | POA: Insufficient documentation

## 2012-09-16 DIAGNOSIS — G20A1 Parkinson's disease without dyskinesia, without mention of fluctuations: Secondary | ICD-10-CM | POA: Insufficient documentation

## 2012-09-19 ENCOUNTER — Emergency Department (HOSPITAL_COMMUNITY): Payer: Medicare Other

## 2012-09-19 ENCOUNTER — Emergency Department (HOSPITAL_COMMUNITY)
Admission: EM | Admit: 2012-09-19 | Discharge: 2012-09-20 | Disposition: A | Discharge status: Home / Self Care | Payer: Medicare Other | Attending: Emergency Medicine | Admitting: Emergency Medicine

## 2012-09-19 ENCOUNTER — Encounter: Payer: Self-pay | Admitting: Neurology

## 2012-09-19 ENCOUNTER — Ambulatory Visit (INDEPENDENT_AMBULATORY_CARE_PROVIDER_SITE_OTHER): Payer: Medicare Other | Admitting: Neurology

## 2012-09-19 ENCOUNTER — Encounter (HOSPITAL_COMMUNITY): Payer: Self-pay | Admitting: *Deleted

## 2012-09-19 VITALS — BP 150/76 | HR 84 | Temp 98.2°F | Resp 16 | Ht 63.0 in | Wt 137.0 lb

## 2012-09-19 DIAGNOSIS — W1809XA Striking against other object with subsequent fall, initial encounter: Secondary | ICD-10-CM | POA: Insufficient documentation

## 2012-09-19 DIAGNOSIS — D649 Anemia, unspecified: Secondary | ICD-10-CM

## 2012-09-19 DIAGNOSIS — T148XXA Other injury of unspecified body region, initial encounter: Secondary | ICD-10-CM | POA: Diagnosis not present

## 2012-09-19 DIAGNOSIS — D539 Nutritional anemia, unspecified: Secondary | ICD-10-CM | POA: Diagnosis not present

## 2012-09-19 DIAGNOSIS — J45909 Unspecified asthma, uncomplicated: Secondary | ICD-10-CM | POA: Diagnosis not present

## 2012-09-19 DIAGNOSIS — R5383 Other fatigue: Secondary | ICD-10-CM | POA: Diagnosis not present

## 2012-09-19 DIAGNOSIS — Z853 Personal history of malignant neoplasm of breast: Secondary | ICD-10-CM | POA: Diagnosis not present

## 2012-09-19 DIAGNOSIS — Y939 Activity, unspecified: Secondary | ICD-10-CM | POA: Insufficient documentation

## 2012-09-19 DIAGNOSIS — Z79899 Other long term (current) drug therapy: Secondary | ICD-10-CM | POA: Diagnosis not present

## 2012-09-19 DIAGNOSIS — E119 Type 2 diabetes mellitus without complications: Secondary | ICD-10-CM | POA: Insufficient documentation

## 2012-09-19 DIAGNOSIS — T1490XA Injury, unspecified, initial encounter: Secondary | ICD-10-CM | POA: Diagnosis not present

## 2012-09-19 DIAGNOSIS — R5381 Other malaise: Secondary | ICD-10-CM | POA: Diagnosis not present

## 2012-09-19 DIAGNOSIS — R51 Headache: Secondary | ICD-10-CM | POA: Diagnosis not present

## 2012-09-19 DIAGNOSIS — G20 Parkinson's disease: Secondary | ICD-10-CM | POA: Diagnosis not present

## 2012-09-19 DIAGNOSIS — Z8719 Personal history of other diseases of the digestive system: Secondary | ICD-10-CM | POA: Diagnosis not present

## 2012-09-19 DIAGNOSIS — S0003XA Contusion of scalp, initial encounter: Secondary | ICD-10-CM | POA: Diagnosis not present

## 2012-09-19 DIAGNOSIS — Y92009 Unspecified place in unspecified non-institutional (private) residence as the place of occurrence of the external cause: Secondary | ICD-10-CM | POA: Insufficient documentation

## 2012-09-19 DIAGNOSIS — G20A1 Parkinson's disease without dyskinesia, without mention of fluctuations: Secondary | ICD-10-CM | POA: Insufficient documentation

## 2012-09-19 DIAGNOSIS — S0083XA Contusion of other part of head, initial encounter: Secondary | ICD-10-CM | POA: Diagnosis not present

## 2012-09-19 DIAGNOSIS — S199XXA Unspecified injury of neck, initial encounter: Secondary | ICD-10-CM | POA: Diagnosis not present

## 2012-09-19 DIAGNOSIS — S298XXA Other specified injuries of thorax, initial encounter: Secondary | ICD-10-CM | POA: Diagnosis not present

## 2012-09-19 DIAGNOSIS — W19XXXA Unspecified fall, initial encounter: Secondary | ICD-10-CM

## 2012-09-19 DIAGNOSIS — Z0389 Encounter for observation for other suspected diseases and conditions ruled out: Secondary | ICD-10-CM | POA: Diagnosis not present

## 2012-09-19 DIAGNOSIS — S0990XA Unspecified injury of head, initial encounter: Secondary | ICD-10-CM | POA: Diagnosis not present

## 2012-09-19 MED ORDER — CARBIDOPA-LEVODOPA 25-100 MG PO TABS: ORAL_TABLET | ORAL | Status: DC

## 2012-09-19 NOTE — ED Provider Notes (Addendum)
History     CSN: [id number]  Arrival date & time 09/19/12  2324   First MD Initiated Contact with Patient 09/19/12 2333      Chief Complaint  Patient presents with  . Fall  . Hypertension    (Consider location/radiation/quality/duration/timing/severity/associated sxs/prior treatment) HPI HX per PT, walking tonight PTA, states she lost her balance and fell backwards striking her head, no LOC or neck pain.  Denies any weakness, dizziness or numbness. No CP or SOB. Her husband helped her up, she applied ice to head and now presents here for evaluation.  No new medications. Some tailbone discomfort but denies any other pain, injury or trauma. MOD in severity, still mild HA, no bleeding.  Past Medical History  Diagnosis Date  . NHL (non-Hodgkin's lymphoma)     nhl dx 9/04 breast ca dx1/12  . Asthma   . Hearing loss   . Diabetes mellitus without complication     09/19/12.Marland KitchenMarland Kitchenpt denies  . GERD (gastroesophageal reflux disease)     Past Surgical History  Procedure Laterality Date  . Mastectomy  2 /8/ 12    bilateral  . Ba-ha ear implant      Family History  Problem Relation Age of Onset  . Lung cancer Father   . Cancer Father     lung  . Cancer Mother     History  Substance Use Topics  . Smoking status: Never Smoker   . Smokeless tobacco: Never Used     Comment: husband wsa smoker  . Alcohol Use: No     Comment: no    OB History   Grav Para Term Preterm Abortions TAB SAB Ect Mult Living                  Review of Systems  Constitutional: Negative for fever and chills.  HENT: Negative for neck pain and neck stiffness.   Eyes: Negative for pain.  Respiratory: Negative for shortness of breath.   Cardiovascular: Negative for chest pain.  Gastrointestinal: Negative for abdominal pain.  Genitourinary: Negative for dysuria.  Musculoskeletal: Negative for back pain.  Skin: Negative for rash.  Neurological: Negative for seizures, syncope, weakness, light-headedness  and numbness.  All other systems reviewed and are negative.    Allergies  Review of patient's allergies indicates no known allergies.  Home Medications   Current Outpatient Rx  Name  Route  Sig  Dispense  Refill  . acyclovir (ZOVIRAX) 400 MG tablet   Oral   Take 400 mg by mouth 2 (two) times daily.         [name] anastrozole (ARIMIDEX) 1 MG tablet   Oral   Take 1 tablet (1 mg total) by mouth daily.   90 tablet   3   . buPROPion (WELLBUTRIN XL) 300 MG 24 hr tablet   Oral   Take 300 mg by mouth daily.         . Calcium Carbonate-Vitamin D (CALCIUM 600 + D PO)   Oral   Take 1 capsule by mouth daily.          . carbidopa-levodopa (SINEMET) 25-100 MG per tablet      1/2 tablet by mouth tid x week, then 1/2 in AM and noon & 1 in evening x 1 week, then 1/2 in AM & 1 at noon & evening x 1 week, then 1 po tid before meals thereafter.   90 tablet   3   . CRANBERRY EXTRACT PO   Oral  Take 1 tablet by mouth daily.           [name] darifenacin (ENABLEX) 15 MG 24 hr tablet   Oral   Take 15 mg by mouth daily.         . Fluticasone-Salmeterol (ADVAIR DISKUS) 100-50 MCG/DOSE AEPB   Inhalation   Inhale 1 puff into the lungs every 12 (twelve) hours.          [name] loratadine (CLARITIN) 10 MG tablet   Oral   Take 10 mg by mouth daily. For runny nose as needed         . Multiple Vitamins-Minerals (MULTIVITAMIN WITH MINERALS) tablet   Oral   Take 1 tablet by mouth daily.             BP 206/99  Pulse 87  Temp(Src) 97.9 F (36.6 C) (Oral)  SpO2 100%  Physical Exam  Constitutional: She is oriented to person, place, and time. She appears well-developed and well-nourished.  HENT:  Head: Normocephalic.  Posterior scalp contusion, no lac  Eyes: EOM are normal. Pupils are equal, round, and reactive to light.  Neck: Neck supple.  No c spine tenderness  Cardiovascular: Normal rate, regular rhythm and intact distal pulses.   Pulmonary/Chest: Effort normal and breath sounds  normal. No respiratory distress. She exhibits no tenderness.  Abdominal: Soft. Bowel sounds are normal. She exhibits no distension. There is no tenderness. There is no rebound.  Musculoskeletal: Normal range of motion. She exhibits no edema.  No T/L spine tenderness, no deficits with distal N/V intact x 4  Neurological: She is alert and oriented to person, place, and time. No cranial nerve deficit. Coordination normal.  Skin: Skin is warm and dry.    ED Course  Procedures (including critical care time)  Results for orders placed during the hospital encounter of 09/19/12  URINALYSIS, ROUTINE W REFLEX MICROSCOPIC      Result Value Range   Color, Urine YELLOW  YELLOW   APPearance HAZY (*) CLEAR   Specific Gravity, Urine 1.012  1.005 - 1.030   pH 5.0  5.0 - 8.0   Glucose, UA NEGATIVE  NEGATIVE mg/dL   Hgb urine dipstick NEGATIVE  NEGATIVE   Bilirubin Urine NEGATIVE  NEGATIVE   Ketones, ur NEGATIVE  NEGATIVE mg/dL   Protein, ur NEGATIVE  NEGATIVE mg/dL   Urobilinogen, UA 0.2  0.0 - 1.0 mg/dL   Nitrite NEGATIVE  NEGATIVE   Leukocytes, UA SMALL (*) NEGATIVE  CBC      Result Value Range   WBC 5.9  4.0 - 10.5 K/uL   RBC 3.94  3.87 - 5.11 MIL/uL   Hemoglobin 11.8 (*) 12.0 - 15.0 g/dL   HCT 84.1 (*) 32.4 - 40.1 %   MCV 85.3  78.0 - 100.0 fL   MCH 29.9  26.0 - 34.0 pg   MCHC 35.1  30.0 - 36.0 g/dL   RDW 02.7  25.3 - 66.4 %   Platelets 141 (*) 150 - 400 K/uL  URINE MICROSCOPIC-ADD ON      Result Value Range   Squamous Epithelial / LPF RARE  RARE   WBC, UA 0-2  <3 WBC/hpf   RBC / HPF 0-2  <3 RBC/hpf   Bacteria, UA RARE  RARE  POCT I-STAT, CHEM 8      Result Value Range   Sodium 139  135 - 145 mEq/L   Potassium 4.2  3.5 - 5.1 mEq/L   Chloride 106  96 - 112 mEq/L  BUN 24 (*) 6 - 23 mg/dL   Creatinine, Ser 1.61  0.50 - 1.10 mg/dL   Glucose, Bld 096 (*) 70 - 99 mg/dL   Calcium, Ion 0.45  4.09 - 1.30 mmol/L   TCO2 27  0 - 100 mmol/L   Hemoglobin 11.6 (*) 12.0 - 15.0 g/dL   HCT  81.1 (*) 91.4 - 46.0 %   Dg Chest 2 View  09/20/2012  *RADIOLOGY REPORT*  Clinical Data: Fall.  CHEST - 2 VIEW  Comparison: 10/30/2011  Findings: Slightly shallow inspiration.  Lucency in the upper lungs suggest emphysema.  Scattered fibrosis in the lungs.  Surgical clips in the axilla bilaterally.  Normal heart size and pulmonary vascularity.  No focal consolidation or airspace disease in the lungs.  No blunting of costophrenic angles.  No pneumothorax. Mediastinal contours appear intact.  Calcified and tortuous aorta. Degenerative changes in the spine.  No significant changes since previous study.  IMPRESSION: Emphysematous changes in the lungs with scattered fibrosis.  No evidence of active pulmonary disease.   Original Report Authenticated By: Burman Nieves, M.D.    Ct Head Wo Contrast  09/20/2012  *RADIOLOGY REPORT*  Clinical Data:  The patient lost balance and fell to the ground, striking head on concrete.  Hypertension.  History of non-Hodgkins lymphoma.  CT HEAD WITHOUT CONTRAST CT CERVICAL SPINE WITHOUT CONTRAST  Technique:  Multidetector CT imaging of the head and cervical spine was performed following the standard protocol without intravenous contrast.  Multiplanar CT image reconstructions of the cervical spine were also generated.  Comparison:  CT head and cervical spine 05/08/2012.  CT HEAD  Findings: Diffuse cerebral atrophy.  Mild ventricular dilatation consistent with central atrophy.  Low attenuation changes in the deep white matter consistent with small vessel ischemia.  No mass effect or midline shift.  No abnormal extra-axial fluid collections.  Gray-white matter junctions are distinct.  Basal cisterns are not effaced.  The no evidence of acute intracranial hemorrhage.  There is a subcutaneous soft tissue scalp hematoma over the left posterior vertex.  No underlying skull fractures. Postoperative change in the right posterior parietal skull.  This visualized paranasal sinuses and mastoid  air cells are not opacified. Intracranial vascular calcifications.  IMPRESSION: No acute intracranial abnormalities.  CT CERVICAL SPINE  Findings: The normal alignment of the cervical vertebrae and facet joints.  Lateral masses of C1 appear symmetrical.  The odontoid process appears intact.  Diffuse degenerative changes with narrowed cervical interspaces and endplate hypertrophic changes throughout. Degenerative changes in the cervical facet joints and at C1-2.  No vertebral compression deformities.  No prevertebral soft tissue swelling.  Bone cortex and trabecular architecture appear intact. No focal bone lesion or bone destruction.  Vascular calcifications in the cervical carotid arteries.  Scarring in the lung apices.  IMPRESSION: Diffuse degenerative changes in the cervical spine.  No displaced fractures identified.   Original Report Authenticated By: Burman Nieves, M.D.    Ct Cervical Spine Wo Contrast  09/20/2012  *RADIOLOGY REPORT*  Clinical Data:  The patient lost balance and fell to the ground, striking head on concrete.  Hypertension.  History of non-Hodgkins lymphoma.  CT HEAD WITHOUT CONTRAST CT CERVICAL SPINE WITHOUT CONTRAST  Technique:  Multidetector CT imaging of the head and cervical spine was performed following the standard protocol without intravenous contrast.  Multiplanar CT image reconstructions of the cervical spine were also generated.  Comparison:  CT head and cervical spine 05/08/2012.  CT HEAD  Findings: Diffuse cerebral atrophy.  Mild ventricular dilatation consistent with central atrophy.  Low attenuation changes in the deep white matter consistent with small vessel ischemia.  No mass effect or midline shift.  No abnormal extra-axial fluid collections.  Gray-white matter junctions are distinct.  Basal cisterns are not effaced.  The no evidence of acute intracranial hemorrhage.  There is a subcutaneous soft tissue scalp hematoma over the left posterior vertex.  No underlying skull  fractures. Postoperative change in the right posterior parietal skull.  This visualized paranasal sinuses and mastoid air cells are not opacified. Intracranial vascular calcifications.  IMPRESSION: No acute intracranial abnormalities.  CT CERVICAL SPINE  Findings: The normal alignment of the cervical vertebrae and facet joints.  Lateral masses of C1 appear symmetrical.  The odontoid process appears intact.  Diffuse degenerative changes with narrowed cervical interspaces and endplate hypertrophic changes throughout. Degenerative changes in the cervical facet joints and at C1-2.  No vertebral compression deformities.  No prevertebral soft tissue swelling.  Bone cortex and trabecular architecture appear intact. No focal bone lesion or bone destruction.  Vascular calcifications in the cervical carotid arteries.  Scarring in the lung apices.  IMPRESSION: Diffuse degenerative changes in the cervical spine.  No displaced fractures identified.   Original Report Authenticated By: Burman Nieves, M.D.        Date: 09/19/2012  Rate: 89  Rhythm: normal sinus rhythm  QRS Axis: normal  Intervals: normal  ST/T Wave abnormalities: nonspecific ST changes  Conduction Disutrbances:none  Narrative Interpretation:   Old EKG Reviewed: none available  IVFs. IV fentanyl  2:06 AM recheck - is feeling much better and requesting to be discharged home, husband now bedside, he witnessed fall and states she lost her balance, was holding water in each hand and had nothing to grab to break her fall, landed on buttocks and then hit her head.  HAs PD followed by NEU/ She ambulates in the ED NAD. Stable for d/c home and outpatient follow up MDM   Fall, no syncope  ECG. Imaging and labs reviewed as above  VS and nursing notes reviewed. BP normalizing with pain control.   Serial evaluations  IVFs. IV narcotics          Sunnie Nielsen, MD 09/20/12 0210  Sunnie Nielsen, MD 09/20/12 [phone]

## 2012-09-19 NOTE — Patient Instructions (Addendum)
1.  Start carbidopa/levodopa:  1/2 po three times daily x 1 week, then 1/2 in am and noon and 1 at night for a week, then 1/2 in am and 1 at noon and night for a week, then 1 po three times per day thereafter 2.  Continue PT and exercise

## 2012-09-19 NOTE — ED Notes (Signed)
Pt. Taken to Radiology

## 2012-09-19 NOTE — ED Notes (Signed)
Pt was walking one sidewalk, lost balance and fell to ground.  Pt states she struck her head on the concrete, but denies any LOC or confusion.  Pt states pain at area of impact, but denies any back or neck pain.  Pt hypertensive when assessed by EMS BP of 208/104.  Equal grip strength, pupils PERRLA, and no facial drooping noted.

## 2012-09-19 NOTE — Progress Notes (Signed)
[name] was seen today in the movement disorders clinic for neurologic consultation at the request of [name].    The consultation is for the evaluation of ataxia and tremor.  The patient has been a previous pt of [name].  [name] dx the patient with parkinsonism and ET.  This is an 79 y/o female with a complex medical hx of both non Hodgkins lymphoma require chemotherapy and breast CA, s/p bilateral mastectomy.  She has a long hx of tremor.  I reviewed [name] notes that were made available to me.  It appears that she presented with right hand resting tremor in 2009, without associated rigidity or bradykinesia.  There were no known medications causing the problem and it appears that she was initially dx with ET and later transitioned the dx to parkinsonism.  The patient reports no tremor in the L hand.  Her husband reports a little tremor in the R leg.  The patient reports that tremor is most significant with relaxing and she has no tremor if her hands are preoccupied.    She has never been on PD medication or been to PT until yesterday.    Specific Symptoms:  Tremor: yes Voice: "dry throat"; always been soft spoken, and pt reports that friends note that "comes and goes" Sleep: no problems  Vivid Dreams:  yes but always has  Acting out dreams:  yes and has had sleep walking and ended up falling and hit face requiring stitches.  Had hx of sleep walking when younger. Wet Pillows: yes Postural symptoms:  yes and started PT recently.  Falls?  no Bradykinesia symptoms: difficulty with initiating movement, slow movements and drooling while awake; shuffling intermittently per husband Loss of smell:  no Loss of taste:  no Urinary Incontinence:  yes, wears pad and just few drops of urine Difficulty Swallowing:  no Handwriting, micrographia: yes Trouble with ADL's:  no  Trouble buttoning clothing: yes Depression:  no Memory changes:  yes, trouble with recalling dates,  drives very rarely Hallucinations:  no  visual distortions: yes N/V:  no Lightheaded:  no  Syncope: no Diplopia:  no Dyskinesia:  no  Neuroimaging has  previously been performed.  An MRI brain was last done in 2010 but pt can no longer have MRI's because of an implantable hearing device.    CT HEAD WITHOUT CONTRAST CT MAXILLOFACIAL WITHOUT CONTRAST   Technique:  Multidetector CT imaging of the head and maxillofacial structures were performed using the standard protocol without intravenous contrast. Multiplanar CT image reconstructions of the maxillofacial structures were also generated.   Comparison:  MRI brain dated 12/04/2008   CT HEAD   Findings: No evidence of parenchymal hemorrhage or extra-axial fluid collection. No mass lesion, mass effect, or midline shift.   No CT evidence of acute infarction.   Subcortical white matter and periventricular small vessel ischemic changes.  Intracranial atherosclerosis.   Global cortical atrophy, possibly age appropriate.  No ventriculomegaly.   The visualized paranasal sinuses are essentially clear.  The left mastoid air cells are unopacified.  Prior right mastoidectomy.   Metallic device in the right occipital scalp (series 8/image 21). No evidence of calvarial fracture.   IMPRESSION: No evidence of acute intracranial abnormality.   Atrophy with small vessel ischemic changes and intracranial atherosclerosis.   CT MAXILLOFACIAL   Findings:   No evidence of maxillofacial fracture.   The visualized paranasal sinuses are essentially clear.  The left mastoid air cells are unopacified.  Prior right mastoidectomy.   The bilateral orbits, including the retroconal soft tissues, are within normal limits.   The visualized cervical spine is intact to the bottom of C7, noting mild multilevel degenerative changes.   IMPRESSION: No evidence of maxillofacial fracture. A CT brain was recently performed:    Clinical Data:  Alzheimer's. Right hand tremors. History of  lymphoma.  MRI HEAD WITHOUT AND WITH CONTRAST  Technique: Multiplanar, multiecho pulse sequences of the brain and  surrounding structures were obtained according to standard protocol  without and with intravenous contrast  Contrast: 7 ml Multihance IV.  Comparison: CT 03/24/2004.  Findings: There is age appropriate atrophy. This is generalized  atrophy which is not lobar specific. There is moderate patchy  hyperintensity throughout the cerebral white matter bilaterally.  This is compatible with chronic microvascular ischemia. There is  a small cyst in the right basal ganglia. The brainstem and  cerebellum appear normal. Diffusion weighted imaging is negative  for acute infarct. Negative for hemorrhage or mass lesion.  Following contrast infusion there is a small venous angioma in the  right lateral frontal lobe. No evidence of edema or hemorrhage is  identified. No enhancing mass lesion is identified. There is  chronic sinusitis with mucosal thickening in the paranasal sinuses.  There also is an air-fluid level in the left frontal sinus.  IMPRESSION:  Age appropriate atrophy with chronic microvascular ischemia. No  acute infarct.  Small venous angioma in the right frontal lobe.  Acute and chronic sinusitis.    PREVIOUS MEDICATIONS: none to date  ALLERGIES:  No Known Allergies  CURRENT MEDICATIONS:  Current Outpatient Prescriptions on File Prior to Visit  Medication Sig Dispense Refill  . acyclovir (ZOVIRAX) 400 MG tablet Take 400 mg by mouth 2 (two) times daily.      [name] anastrozole (ARIMIDEX) 1 MG tablet Take 1 tablet (1 mg total) by mouth daily.  90 tablet  3  . buPROPion (WELLBUTRIN XL) 300 MG 24 hr tablet Take 300 mg by mouth daily.      . Calcium Carbonate-Vitamin D (CALCIUM 600 + D PO) Take 1 capsule by mouth daily.       [name] CRANBERRY EXTRACT PO Take 1 tablet by mouth daily.        [name] darifenacin (ENABLEX) 15 MG 24 hr tablet Take  15 mg by mouth daily.      . Fluticasone-Salmeterol (ADVAIR DISKUS) 100-50 MCG/DOSE AEPB Inhale 1 puff into the lungs every 12 (twelve) hours.       . Multiple Vitamins-Minerals (MULTIVITAMIN WITH MINERALS) tablet Take 1 tablet by mouth daily.        [name] loratadine (CLARITIN) 10 MG tablet Take 10 mg by mouth daily. For runny nose as needed       No current facility-administered medications on file prior to visit.    PAST MEDICAL HISTORY:   Past Medical History  Diagnosis Date  . NHL (non-Hodgkin's lymphoma)     nhl dx 9/04 breast ca dx1/12  . Asthma   . Hearing loss   . Diabetes mellitus without complication     09/19/12.Marland KitchenMarland Kitchenpt denies  . GERD (gastroesophageal reflux disease)     PAST SURGICAL HISTORY:   Past Surgical History  Procedure Laterality Date  . Mastectomy  2 /8/ 12    bilateral  . Ba-ha ear implant      SOCIAL HISTORY:   History   Social History  . Marital Status: Married    Spouse Name: N/A  Number of Children: N/A  . Years of Education: N/A   Occupational History  . 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 Smoking status: Never Smoker   . Smokeless tobacco: Never Used     Comment: husband wsa smoker  . Alcohol Use: No     Comment: no  . Drug Use: No  . Sexually Active: Not on file   Other Topics Concern  . Not on file   Social History Narrative  . No narrative on file    FAMILY HISTORY:   Family Status  Relation Status Death Age  . Father Deceased     CA, lung  . Mother Deceased     CA; complications of splenectomy  . Brother Deceased     3, renal failure, DM-2; CAD; CA  . Child Alive     5 (4 biological), one with PKU    ROS:  A complete 10 system review of systems was obtained and was unremarkable apart from what is mentioned above.  PHYSICAL EXAMINATION:    VITALS:   Filed Vitals:   09/19/12 1234  BP: 150/76  Pulse: 84  Temp: 98.2 F (36.8 C)  Resp: 16  Height: 5\' 3"  (1.6 m)  Weight: 137 lb (62.143 kg)     GEN:  The patient appears stated age and is in NAD. HEENT:  Normocephalic, atraumatic.  The mucous membranes are moist. The superficial temporal arteries are without ropiness or tenderness. CV:  RRR Lungs:  CTAB Neck/HEME:  There are no carotid bruits bilaterally.  Neurological examination:  Orientation: The patient is alert and oriented x3.  Attention and concentration are normal.    Able to name objects and repeat phrases.  A complete Mini-Mental status examination was performed today and the patient scored a 26/30. Cranial nerves: There is good facial symmetry. Pupils are equal round and reactive to light bilaterally. Fundoscopic exam reveals clear margins bilaterally. Extraocular muscles are intact. The visual fields are full to confrontational testing. The speech is fluent and clear. Soft palate rises symmetrically and there is no tongue deviation. Hearing is intact to conversational tone. Sensation: Sensation is intact to light and pinprick throughout (facial, trunk, extremities). Vibration is decreased at the bilateral big toe. There is no extinction with double simultaneous stimulation. There is no sensory dermatomal level identified. Motor: Strength is 5/5 in the bilateral upper and lower extremities.   Shoulder shrug is equal and symmetric.  There is no pronator drift. Deep tendon reflexes: Deep tendon reflexes are 3/4 at the bilateral biceps, triceps, brachioradialis, patella and trace at the bilateralachilles. Plantar responses are downgoing bilaterally.  Movement examination: Tone: There is increased tone in the right upper extremity, moderate, and mild increased tone in the left upper extremity.  Tone increases with activation procedures.  There does appear to be increased tone in the bilateral lower extremities, but there is a gegenhalten quality to it. Abnormal movements: there is an intermittent right upper extremity resting tremor.  A more mild left upper extremity resting  tremor is noted occasionally. Coordination:  There is  decremation with RAM's, seen greater in the right upper extremity than the left. Gait and Station: The patient has some difficulty arising out of a deep-seated chair without the use of the handsbut on the second trial she is able to get up. The patient's stride length is fairly normal.  The patient has a positive pull test.      Labs:  Lab  Results  Component Value Date   WBC 4.0 09/13/2012   HGB 12.1 09/13/2012   HCT 35.9 09/13/2012   MCV 87.1 09/13/2012   PLT 139* 09/13/2012     Chemistry      Component Value Date/Time   NA 144 09/13/2012 1126   NA 141 11/27/2011 1526   K 4.6 09/13/2012 1126   K 4.3 11/27/2011 1526   CL 106 09/13/2012 1126   CL 103 11/27/2011 1526   CO2 30* 09/13/2012 1126   CO2 31 11/27/2011 1526   BUN 20.1 09/13/2012 1126   BUN 19 11/27/2011 1526   CREATININE 1.2* 09/13/2012 1126   CREATININE 1.10 11/27/2011 1526      Component Value Date/Time   CALCIUM 9.3 09/13/2012 1126   CALCIUM 9.0 11/27/2011 1526   CALCIUM 11.1* 07/15/2006 1212   ALKPHOS 92 09/13/2012 1126   ALKPHOS 89 11/27/2011 1526   AST 19 09/13/2012 1126   AST 19 11/27/2011 1526   ALT 18 09/13/2012 1126   ALT 16 11/27/2011 1526   BILITOT 0.41 09/13/2012 1126   BILITOT 0.2* 11/27/2011 1526      Lab Results  Component Value Date   VITAMINB12 1053* 06/10/2006   Lab Results  Component Value Date   TSH 1.928 07/10/2010     ASSESSMENT/PLAN:  1.  Probable idiopathic Parkinson's disease.  This is evidenced by bradykinesia, tremor, postural instability and rigidity.  There are no atypical features.  -We discussed the diagnosis as well as pathophysiology of the disease.  We discussed treatment options as well as prognostic indicators.  Patient education was provided.  -I would like to start the patient on levodopa therapy.  She was given a titration schedule that will work her up to one tablet prior to meals 3 times a day.  Risks, benefits, side effects and alternative  therapies were discussed.  The opportunity to ask questions was given and they were answered to the best of my ability.  The patient expressed understanding and willingness to follow the outlined treatment protocols.  -She will continue with physical therapy.  We talked about the [facility] and they were very interested in this.  Patient education was provided.  -We talked about the importance of exercise, albeit safe exercise. 2.  She does have some features of REM behavior disorder.  We are going to need to monitor this just to make sure that she is safe.  It sounds like she has not had disruptive behavior in quite some time, but when she did it required stitches. 3.  I was going to recheck her TSH and B12 today, but her insurance indicated that B. 12 was done recently.  We will try to get a copy from her primary care physician. 4.  Time in the room today was 80 minutes, with greater than 50% in counseling, as above. 5.  Return in about 3 months (around 12/17/2012).

## 2012-09-20 ENCOUNTER — Ambulatory Visit: Payer: Medicare Other | Admitting: Physical Therapy

## 2012-09-20 ENCOUNTER — Emergency Department (HOSPITAL_COMMUNITY): Payer: Medicare Other

## 2012-09-20 ENCOUNTER — Ambulatory Visit: Payer: Medicare Other

## 2012-09-20 DIAGNOSIS — S199XXA Unspecified injury of neck, initial encounter: Secondary | ICD-10-CM | POA: Diagnosis not present

## 2012-09-20 DIAGNOSIS — S0990XA Unspecified injury of head, initial encounter: Secondary | ICD-10-CM | POA: Diagnosis not present

## 2012-09-20 LAB — URINALYSIS, ROUTINE W REFLEX MICROSCOPIC
Bilirubin Urine: NEGATIVE
Hgb urine dipstick: NEGATIVE
Nitrite: NEGATIVE
Specific Gravity, Urine: 1.012 (ref 1.005–1.030)
Urobilinogen, UA: 0.2 mg/dL (ref 0.0–1.0)
pH: 5 (ref 5.0–8.0)

## 2012-09-20 LAB — POCT I-STAT, CHEM 8
BUN: 24 mg/dL — ABNORMAL HIGH (ref 6–23)
Chloride: 106 mEq/L (ref 96–112)
Creatinine, Ser: 1.1 mg/dL (ref 0.50–1.10)
Sodium: 139 mEq/L (ref 135–145)

## 2012-09-20 LAB — URINE MICROSCOPIC-ADD ON

## 2012-09-20 LAB — CBC
Hemoglobin: 11.8 g/dL — ABNORMAL LOW (ref 12.0–15.0)
MCH: 29.9 pg (ref 26.0–34.0)
Platelets: 141 10*3/uL — ABNORMAL LOW (ref 150–400)
RBC: 3.94 MIL/uL (ref 3.87–5.11)
WBC: 5.9 10*3/uL (ref 4.0–10.5)

## 2012-09-20 MED ORDER — FENTANYL CITRATE 0.05 MG/ML IJ SOLN
25.0000 ug | INTRAMUSCULAR | Status: DC | PRN
  Administered 2012-09-20: 25 ug via INTRAVENOUS
  Filled 2012-09-20: qty 2

## 2012-09-20 MED ORDER — ONDANSETRON HCL 4 MG/2ML IJ SOLN
4.0000 mg | Freq: Once | INTRAMUSCULAR | Status: AC
  Administered 2012-09-20: 4 mg via INTRAVENOUS
  Filled 2012-09-20: qty 2

## 2012-09-20 NOTE — ED Notes (Signed)
Pt returned from Radiology.

## 2012-09-21 ENCOUNTER — Other Ambulatory Visit: Payer: Self-pay

## 2012-09-21 MED ORDER — CARBIDOPA-LEVODOPA CR 25-100 MG PO TBCR: EXTENDED_RELEASE_TABLET | ORAL | Status: DC

## 2012-09-23 ENCOUNTER — Ambulatory Visit: Payer: Medicare Other | Admitting: Physical Therapy

## 2012-09-23 ENCOUNTER — Ambulatory Visit: Payer: Medicare Other

## 2012-09-23 DIAGNOSIS — G20 Parkinson's disease: Secondary | ICD-10-CM | POA: Diagnosis not present

## 2012-09-23 DIAGNOSIS — R471 Dysarthria and anarthria: Secondary | ICD-10-CM | POA: Diagnosis not present

## 2012-09-23 DIAGNOSIS — R41841 Cognitive communication deficit: Secondary | ICD-10-CM | POA: Diagnosis not present

## 2012-09-23 DIAGNOSIS — R269 Unspecified abnormalities of gait and mobility: Secondary | ICD-10-CM | POA: Diagnosis not present

## 2012-09-23 DIAGNOSIS — Z5189 Encounter for other specified aftercare: Secondary | ICD-10-CM | POA: Diagnosis not present

## 2012-10-03 ENCOUNTER — Ambulatory Visit: Payer: Medicare Other | Attending: Neurology | Admitting: Occupational Therapy

## 2012-10-03 ENCOUNTER — Ambulatory Visit: Payer: Medicare Other | Admitting: Physical Therapy

## 2012-10-03 ENCOUNTER — Ambulatory Visit: Payer: Medicare Other | Admitting: Speech Pathology

## 2012-10-03 DIAGNOSIS — R41841 Cognitive communication deficit: Secondary | ICD-10-CM | POA: Diagnosis not present

## 2012-10-03 DIAGNOSIS — R471 Dysarthria and anarthria: Secondary | ICD-10-CM | POA: Insufficient documentation

## 2012-10-03 DIAGNOSIS — R269 Unspecified abnormalities of gait and mobility: Secondary | ICD-10-CM | POA: Insufficient documentation

## 2012-10-03 DIAGNOSIS — Z5189 Encounter for other specified aftercare: Secondary | ICD-10-CM | POA: Insufficient documentation

## 2012-10-03 DIAGNOSIS — G20 Parkinson's disease: Secondary | ICD-10-CM | POA: Diagnosis not present

## 2012-10-03 DIAGNOSIS — G20A1 Parkinson's disease without dyskinesia, without mention of fluctuations: Secondary | ICD-10-CM | POA: Insufficient documentation

## 2012-10-04 DIAGNOSIS — M545 Low back pain: Secondary | ICD-10-CM | POA: Diagnosis not present

## 2012-10-06 ENCOUNTER — Ambulatory Visit: Payer: Medicare Other | Admitting: Speech Pathology

## 2012-10-06 ENCOUNTER — Ambulatory Visit: Payer: Medicare Other | Admitting: Physical Therapy

## 2012-10-06 ENCOUNTER — Ambulatory Visit: Payer: Medicare Other | Admitting: Occupational Therapy

## 2012-10-06 DIAGNOSIS — R41841 Cognitive communication deficit: Secondary | ICD-10-CM | POA: Diagnosis not present

## 2012-10-06 DIAGNOSIS — D492 Neoplasm of unspecified behavior of bone, soft tissue, and skin: Secondary | ICD-10-CM | POA: Diagnosis not present

## 2012-10-06 DIAGNOSIS — G20 Parkinson's disease: Secondary | ICD-10-CM | POA: Diagnosis not present

## 2012-10-06 DIAGNOSIS — Z5189 Encounter for other specified aftercare: Secondary | ICD-10-CM | POA: Diagnosis not present

## 2012-10-06 DIAGNOSIS — R471 Dysarthria and anarthria: Secondary | ICD-10-CM | POA: Diagnosis not present

## 2012-10-06 DIAGNOSIS — R269 Unspecified abnormalities of gait and mobility: Secondary | ICD-10-CM | POA: Diagnosis not present

## 2012-10-10 ENCOUNTER — Ambulatory Visit: Payer: Medicare Other | Admitting: Physical Therapy

## 2012-10-10 ENCOUNTER — Ambulatory Visit: Payer: Medicare Other | Admitting: Occupational Therapy

## 2012-10-10 ENCOUNTER — Ambulatory Visit: Payer: Medicare Other

## 2012-10-10 DIAGNOSIS — R269 Unspecified abnormalities of gait and mobility: Secondary | ICD-10-CM | POA: Diagnosis not present

## 2012-10-12 ENCOUNTER — Ambulatory Visit: Payer: Medicare Other | Admitting: Physical Therapy

## 2012-10-12 ENCOUNTER — Ambulatory Visit: Payer: Medicare Other

## 2012-10-12 ENCOUNTER — Ambulatory Visit: Payer: Medicare Other | Admitting: Occupational Therapy

## 2012-10-12 DIAGNOSIS — R41841 Cognitive communication deficit: Secondary | ICD-10-CM | POA: Diagnosis not present

## 2012-10-12 DIAGNOSIS — G20 Parkinson's disease: Secondary | ICD-10-CM | POA: Diagnosis not present

## 2012-10-12 DIAGNOSIS — R269 Unspecified abnormalities of gait and mobility: Secondary | ICD-10-CM | POA: Diagnosis not present

## 2012-10-12 DIAGNOSIS — Z5189 Encounter for other specified aftercare: Secondary | ICD-10-CM | POA: Diagnosis not present

## 2012-10-12 DIAGNOSIS — R471 Dysarthria and anarthria: Secondary | ICD-10-CM | POA: Diagnosis not present

## 2012-10-13 ENCOUNTER — Telehealth: Payer: Self-pay | Admitting: Neurology

## 2012-10-13 DIAGNOSIS — G20A1 Parkinson's disease without dyskinesia, without mention of fluctuations: Secondary | ICD-10-CM

## 2012-10-13 NOTE — Telephone Encounter (Signed)
Tiffany  Please send rehab an order for PT for this pt.  Thanks!

## 2012-10-13 NOTE — Addendum Note (Signed)
Addended by: Lelon Huh on: 10/13/2012 04:14 PM   Modules accepted: Orders

## 2012-10-14 ENCOUNTER — Other Ambulatory Visit: Payer: Self-pay | Admitting: *Deleted

## 2012-10-14 DIAGNOSIS — C8589 Other specified types of non-Hodgkin lymphoma, extranodal and solid organ sites: Secondary | ICD-10-CM

## 2012-10-14 MED ORDER — ACYCLOVIR 400 MG PO TABS: 400.0000 mg | ORAL_TABLET | Freq: Two times a day (BID) | ORAL | Status: DC

## 2012-10-17 ENCOUNTER — Ambulatory Visit: Payer: Medicare Other

## 2012-10-17 ENCOUNTER — Ambulatory Visit: Payer: Medicare Other | Admitting: Physical Therapy

## 2012-10-25 ENCOUNTER — Ambulatory Visit: Payer: Medicare Other | Attending: Neurology | Admitting: Occupational Therapy

## 2012-10-25 ENCOUNTER — Ambulatory Visit: Payer: Medicare Other

## 2012-10-25 ENCOUNTER — Ambulatory Visit: Payer: Medicare Other | Admitting: Physical Therapy

## 2012-10-25 DIAGNOSIS — R41841 Cognitive communication deficit: Secondary | ICD-10-CM | POA: Insufficient documentation

## 2012-10-25 DIAGNOSIS — R471 Dysarthria and anarthria: Secondary | ICD-10-CM | POA: Diagnosis not present

## 2012-10-25 DIAGNOSIS — R269 Unspecified abnormalities of gait and mobility: Secondary | ICD-10-CM | POA: Insufficient documentation

## 2012-10-25 DIAGNOSIS — G20A1 Parkinson's disease without dyskinesia, without mention of fluctuations: Secondary | ICD-10-CM | POA: Insufficient documentation

## 2012-10-25 DIAGNOSIS — G20 Parkinson's disease: Secondary | ICD-10-CM | POA: Diagnosis not present

## 2012-10-25 DIAGNOSIS — Z5189 Encounter for other specified aftercare: Secondary | ICD-10-CM | POA: Diagnosis not present

## 2012-10-27 ENCOUNTER — Ambulatory Visit: Payer: Medicare Other | Admitting: Physical Therapy

## 2012-10-27 ENCOUNTER — Ambulatory Visit: Payer: Medicare Other

## 2012-10-27 ENCOUNTER — Ambulatory Visit: Payer: Medicare Other | Admitting: Occupational Therapy

## 2012-10-27 DIAGNOSIS — R269 Unspecified abnormalities of gait and mobility: Secondary | ICD-10-CM | POA: Diagnosis not present

## 2012-10-27 DIAGNOSIS — R41841 Cognitive communication deficit: Secondary | ICD-10-CM | POA: Diagnosis not present

## 2012-10-27 DIAGNOSIS — Z5189 Encounter for other specified aftercare: Secondary | ICD-10-CM | POA: Diagnosis not present

## 2012-10-27 DIAGNOSIS — R471 Dysarthria and anarthria: Secondary | ICD-10-CM | POA: Diagnosis not present

## 2012-10-27 DIAGNOSIS — G20 Parkinson's disease: Secondary | ICD-10-CM | POA: Diagnosis not present

## 2012-10-31 ENCOUNTER — Encounter: Payer: Medicare Other | Admitting: Occupational Therapy

## 2012-10-31 ENCOUNTER — Ambulatory Visit: Payer: Medicare Other | Admitting: Physical Therapy

## 2012-11-02 ENCOUNTER — Ambulatory Visit: Payer: Medicare Other

## 2012-11-02 ENCOUNTER — Ambulatory Visit: Payer: Medicare Other | Admitting: Occupational Therapy

## 2012-11-02 ENCOUNTER — Ambulatory Visit: Payer: Medicare Other | Admitting: Physical Therapy

## 2012-11-02 DIAGNOSIS — R471 Dysarthria and anarthria: Secondary | ICD-10-CM | POA: Diagnosis not present

## 2012-11-02 DIAGNOSIS — G20 Parkinson's disease: Secondary | ICD-10-CM | POA: Diagnosis not present

## 2012-11-02 DIAGNOSIS — Z5189 Encounter for other specified aftercare: Secondary | ICD-10-CM | POA: Diagnosis not present

## 2012-11-02 DIAGNOSIS — R269 Unspecified abnormalities of gait and mobility: Secondary | ICD-10-CM | POA: Diagnosis not present

## 2012-11-02 DIAGNOSIS — R41841 Cognitive communication deficit: Secondary | ICD-10-CM | POA: Diagnosis not present

## 2012-11-07 ENCOUNTER — Ambulatory Visit: Payer: Medicare Other | Admitting: Occupational Therapy

## 2012-11-07 ENCOUNTER — Ambulatory Visit: Payer: Medicare Other | Admitting: Physical Therapy

## 2012-11-07 ENCOUNTER — Ambulatory Visit: Payer: Medicare Other

## 2012-11-07 DIAGNOSIS — R41841 Cognitive communication deficit: Secondary | ICD-10-CM | POA: Diagnosis not present

## 2012-11-07 DIAGNOSIS — Z5189 Encounter for other specified aftercare: Secondary | ICD-10-CM | POA: Diagnosis not present

## 2012-11-07 DIAGNOSIS — R471 Dysarthria and anarthria: Secondary | ICD-10-CM | POA: Diagnosis not present

## 2012-11-07 DIAGNOSIS — R269 Unspecified abnormalities of gait and mobility: Secondary | ICD-10-CM | POA: Diagnosis not present

## 2012-11-07 DIAGNOSIS — G20 Parkinson's disease: Secondary | ICD-10-CM | POA: Diagnosis not present

## 2012-11-09 ENCOUNTER — Ambulatory Visit: Payer: Medicare Other | Admitting: Occupational Therapy

## 2012-11-09 ENCOUNTER — Ambulatory Visit: Payer: Medicare Other

## 2012-11-09 ENCOUNTER — Ambulatory Visit: Payer: Medicare Other | Admitting: Physical Therapy

## 2012-11-09 DIAGNOSIS — R269 Unspecified abnormalities of gait and mobility: Secondary | ICD-10-CM | POA: Diagnosis not present

## 2012-11-09 DIAGNOSIS — R41841 Cognitive communication deficit: Secondary | ICD-10-CM | POA: Diagnosis not present

## 2012-11-09 DIAGNOSIS — Z5189 Encounter for other specified aftercare: Secondary | ICD-10-CM | POA: Diagnosis not present

## 2012-11-09 DIAGNOSIS — R471 Dysarthria and anarthria: Secondary | ICD-10-CM | POA: Diagnosis not present

## 2012-11-09 DIAGNOSIS — G20 Parkinson's disease: Secondary | ICD-10-CM | POA: Diagnosis not present

## 2012-12-05 DIAGNOSIS — H2510 Age-related nuclear cataract, unspecified eye: Secondary | ICD-10-CM | POA: Diagnosis not present

## 2012-12-26 ENCOUNTER — Telehealth: Payer: Self-pay | Admitting: Oncology

## 2012-12-26 ENCOUNTER — Ambulatory Visit (HOSPITAL_BASED_OUTPATIENT_CLINIC_OR_DEPARTMENT_OTHER): Payer: Medicare Other | Admitting: Oncology

## 2012-12-26 ENCOUNTER — Other Ambulatory Visit (HOSPITAL_BASED_OUTPATIENT_CLINIC_OR_DEPARTMENT_OTHER): Payer: Medicare Other | Admitting: Lab

## 2012-12-26 ENCOUNTER — Other Ambulatory Visit: Payer: Self-pay | Admitting: Oncology

## 2012-12-26 VITALS — BP 166/75 | HR 84 | Temp 97.8°F | Resp 18 | Ht 63.0 in | Wt 131.2 lb

## 2012-12-26 DIAGNOSIS — C8589 Other specified types of non-Hodgkin lymphoma, extranodal and solid organ sites: Secondary | ICD-10-CM

## 2012-12-26 DIAGNOSIS — D6481 Anemia due to antineoplastic chemotherapy: Secondary | ICD-10-CM

## 2012-12-26 DIAGNOSIS — C50919 Malignant neoplasm of unspecified site of unspecified female breast: Secondary | ICD-10-CM

## 2012-12-26 DIAGNOSIS — D696 Thrombocytopenia, unspecified: Secondary | ICD-10-CM

## 2012-12-26 LAB — CBC WITH DIFFERENTIAL/PLATELET
BASO%: 0.7 % (ref 0.0–2.0)
Basophils Absolute: 0 10*3/uL (ref 0.0–0.1)
EOS%: 2.2 % (ref 0.0–7.0)
Eosinophils Absolute: 0.1 10*3/uL (ref 0.0–0.5)
HCT: 36.2 % (ref 34.8–46.6)
HGB: 12.1 g/dL (ref 11.6–15.9)
LYMPH%: 17.2 % (ref 14.0–49.7)
MCH: 29.3 pg (ref 25.1–34.0)
MCHC: 33.4 g/dL (ref 31.5–36.0)
MCV: 87.7 fL (ref 79.5–101.0)
MONO#: 0.3 10*3/uL (ref 0.1–0.9)
MONO%: 6.4 % (ref 0.0–14.0)
NEUT#: 3 10*3/uL (ref 1.5–6.5)
NEUT%: 73.5 % (ref 38.4–76.8)
Platelets: 134 10*3/uL — ABNORMAL LOW (ref 145–400)
RBC: 4.13 10*6/uL (ref 3.70–5.45)
RDW: 13 % (ref 11.2–14.5)
WBC: 4.1 10*3/uL (ref 3.9–10.3)
lymph#: 0.7 10*3/uL — ABNORMAL LOW (ref 0.9–3.3)
nRBC: 0 % (ref 0–0)

## 2012-12-26 LAB — COMPREHENSIVE METABOLIC PANEL (CC13)
ALT: 7 U/L (ref 0–55)
Albumin: 3.7 g/dL (ref 3.5–5.0)
CO2: 29 mEq/L (ref 22–29)
Calcium: 9 mg/dL (ref 8.4–10.4)
Chloride: 106 mEq/L (ref 98–107)
Potassium: 3.9 mEq/L (ref 3.5–5.1)
Sodium: 144 mEq/L (ref 136–145)
Total Protein: 6.2 g/dL — ABNORMAL LOW (ref 6.4–8.3)

## 2012-12-26 NOTE — Progress Notes (Signed)
   [facility]    OFFICE PROGRESS NOTE   INTERVAL HISTORY:   She returns as scheduled. She continues Arimidex. She reports malaise, but she exercises 3 days per week. Good appetite.  Objective:  Vital signs in last 24 hours:  Blood pressure 166/75, pulse 84, temperature 97.8 F (36.6 C), temperature source Oral, resp. rate 18, height 5\' 3"  (1.6 m), weight 131 lb 3.2 oz (59.512 kg).    HEENT: Neck without Lymphatics: No cervical, supraclavicular, axillary, or inguinal node Resp: Lungs clear bilaterally Cardio: Regular rate and rhythm GI: No hepatosplenomegaly, fullness in the left mid abdomen without a discrete mass Vascular: No leg edema Breasts: Status post bilateral mastectomy. No evidence for chest wall tumor recurrence.     Lab Results:  Lab Results  Component Value Date   WBC 4.1 12/26/2012   HGB 12.1 12/26/2012   HCT 36.2 12/26/2012   MCV 87.7 12/26/2012   PLT 134* 12/26/2012   ANC 3.0    Medications: I have reviewed the patient's current medications.  Assessment/Plan: 1. Non-Hodgkin lymphoma treated with fludarabine/rituximab, last in July of 2009.  a. Restaging CT 11/24/2010 revealed evidence of progressive lymphoma in the abdomen and pelvis.  b. Initiation of salvage therapy with bendamustine/rituximab 12/04/2010. She completed 3 cycles.  c. Restaging CT 03/03/2011 revealed stable retroperitoneal soft tissue and a marked decrease in the soft tissue thickening associated with dilated loops of small bowel. 2. History of ITP. Intermittent mild thrombocytopenia persists. 3. History of Herpes zoster-maintained on prophylactic acyclovir. 4. Anemia secondary to non-Hodgkin lymphoma, chemotherapy, and a history of autoimmune hemolysis. The hemoglobin is stable today. 5. Hypertension. 6. Right-hand tremor/ataxia-followed by neurology, now taking Sinemet 7. Hearing loss-status post placement of an implanted hearing device by Dr. Jac Canavan. 8. Bilateral breast  cancer, right breast cancer-grade 1, T1 N0, ER/PR positive, and HER2 negative. Left breast cancer, synchronous grade 2, T2 N0, ER/PR positive, and HER2 negative. She began adjuvant Arimidex on 09/15/2010. 9. Inflammatory changes of both lungs on a CT of the chest 11/24/2010-progressive on the restaging CT 03/03/2011, status post a bronchoscopy 03/17/2011 with no evidence of granulomata or tumor. 10. 11 mm spiculated lesion in the lingula on a CT 11/24/2010-less distinct on the CT 03/03/2011. 11. Upper respiratory infection while visiting her son in [place], June 2013, resolved after treatment with Levaquin/steroids   Disposition:  She remains in clinical remission from breast cancer and lymphoma. She will continue Arimidex. Ms. Violette will return for office visit in 4 months. She will contact us in the interim for new symptoms.   Thornton Papas, MD  12/26/2012  12:48 PM

## 2012-12-26 NOTE — Telephone Encounter (Signed)
Called to request verbal order for mastectomy supplies patient needed on 12/01/12. Reports she had been calling office since then for the verbal order without success. Approved supplies to date 12/01/12.

## 2012-12-26 NOTE — Telephone Encounter (Signed)
Gave pt apt for lab and MD on October 2014

## 2012-12-28 DIAGNOSIS — H10019 Acute follicular conjunctivitis, unspecified eye: Secondary | ICD-10-CM | POA: Diagnosis not present

## 2013-01-03 DIAGNOSIS — B301 Conjunctivitis due to adenovirus: Secondary | ICD-10-CM | POA: Diagnosis not present

## 2013-01-25 ENCOUNTER — Other Ambulatory Visit: Payer: Self-pay | Admitting: Oncology

## 2013-02-10 DIAGNOSIS — H2510 Age-related nuclear cataract, unspecified eye: Secondary | ICD-10-CM | POA: Diagnosis not present

## 2013-02-15 ENCOUNTER — Ambulatory Visit: Payer: Medicare Other | Admitting: Neurology

## 2013-02-17 ENCOUNTER — Other Ambulatory Visit: Payer: Self-pay | Admitting: Family Medicine

## 2013-02-17 ENCOUNTER — Ambulatory Visit
Admission: RE | Admit: 2013-02-17 | Discharge: 2013-02-17 | Disposition: A | Discharge status: Home / Self Care | Payer: Medicare Other | Source: Ambulatory Visit | Attending: Family Medicine | Admitting: Family Medicine

## 2013-02-17 DIAGNOSIS — J209 Acute bronchitis, unspecified: Secondary | ICD-10-CM | POA: Diagnosis not present

## 2013-02-17 DIAGNOSIS — R918 Other nonspecific abnormal finding of lung field: Secondary | ICD-10-CM | POA: Diagnosis not present

## 2013-02-23 DIAGNOSIS — K5900 Constipation, unspecified: Secondary | ICD-10-CM | POA: Diagnosis not present

## 2013-02-23 DIAGNOSIS — J189 Pneumonia, unspecified organism: Secondary | ICD-10-CM | POA: Diagnosis not present

## 2013-02-28 ENCOUNTER — Telehealth: Payer: Self-pay

## 2013-02-28 MED ORDER — CARBIDOPA-LEVODOPA 25-100 MG PO TABS: 1.0000 | ORAL_TABLET | Freq: Three times a day (TID) | ORAL | Status: DC

## 2013-02-28 NOTE — Telephone Encounter (Signed)
Just 30 day supply for now.  She is a few months over due for her appt.  Will refill again at her appt

## 2013-02-28 NOTE — Telephone Encounter (Signed)
rx sent to [facility].

## 2013-02-28 NOTE — Telephone Encounter (Signed)
Faxed refill request for sinemet, pt has f/u scheduled for 8/21

## 2013-03-02 DIAGNOSIS — K5900 Constipation, unspecified: Secondary | ICD-10-CM | POA: Diagnosis not present

## 2013-03-02 DIAGNOSIS — J189 Pneumonia, unspecified organism: Secondary | ICD-10-CM | POA: Diagnosis not present

## 2013-03-03 DIAGNOSIS — J189 Pneumonia, unspecified organism: Secondary | ICD-10-CM | POA: Diagnosis not present

## 2013-03-07 DIAGNOSIS — H95199 Other disorders following mastoidectomy, unspecified ear: Secondary | ICD-10-CM | POA: Diagnosis not present

## 2013-03-07 DIAGNOSIS — H905 Unspecified sensorineural hearing loss: Secondary | ICD-10-CM | POA: Diagnosis not present

## 2013-03-07 DIAGNOSIS — H908 Mixed conductive and sensorineural hearing loss, unspecified: Secondary | ICD-10-CM | POA: Diagnosis not present

## 2013-03-14 ENCOUNTER — Ambulatory Visit: Payer: Medicare Other

## 2013-03-14 ENCOUNTER — Ambulatory Visit: Payer: Self-pay | Admitting: Occupational Therapy

## 2013-03-14 ENCOUNTER — Ambulatory Visit: Payer: Self-pay | Admitting: Physical Therapy

## 2013-03-16 ENCOUNTER — Ambulatory Visit (INDEPENDENT_AMBULATORY_CARE_PROVIDER_SITE_OTHER): Payer: Medicare Other | Admitting: Neurology

## 2013-03-16 ENCOUNTER — Encounter: Payer: Self-pay | Admitting: Neurology

## 2013-03-16 VITALS — BP 106/70 | HR 88 | Temp 97.6°F | Resp 20 | Wt 128.0 lb

## 2013-03-16 DIAGNOSIS — G20 Parkinson's disease: Secondary | ICD-10-CM

## 2013-03-16 DIAGNOSIS — J189 Pneumonia, unspecified organism: Secondary | ICD-10-CM | POA: Diagnosis not present

## 2013-03-16 MED ORDER — CARBIDOPA-LEVODOPA 25-100 MG PO TABS: 1.0000 | ORAL_TABLET | Freq: Three times a day (TID) | ORAL | Status: DC

## 2013-03-16 NOTE — Addendum Note (Signed)
Addended by: Lelon Huh on: 03/16/2013 04:02 PM   Modules accepted: Orders

## 2013-03-16 NOTE — Progress Notes (Addendum)
[name] was seen today in the movement disorders clinic for neurologic consultation at the request of [name].    The consultation is for the evaluation of ataxia and tremor.  The patient has been a previous pt of [name].  [name] dx the patient with parkinsonism and ET.  This is an 79 y/o female with a complex medical hx of both non Hodgkins lymphoma require chemotherapy and breast CA, s/p bilateral mastectomy.  She has a long hx of tremor.  I reviewed [name] notes that were made available to me.  It appears that she presented with right hand resting tremor in 2009, without associated rigidity or bradykinesia.  There were no known medications causing the problem and it appears that she was initially dx with ET and later transitioned the dx to parkinsonism.  The patient reports no tremor in the L hand.  Her husband reports a little tremor in the R leg.  The patient reports that tremor is most significant with relaxing and she has no tremor if her hands are preoccupied.    She has never been on PD medication or been to PT until yesterday.  03/16/13 update:  The pt presents today with her husband.  She has not been seen since February, at which time I dx her with PD.  She was started on levodopa 25/100 three times per day.  She did not follow up sooner as she had bronchitis and pneumonia for the last 2 months and is just now recovering.  She did go to PT and liked it but would like to go back.  She is doing well on the levodopa.  Her husband rarely notice tremor any longer.   Wearing off:  no  How long before next dose:  Not applicable Falls:   no N/V:  no Hallucinations:  no  visual distortions: no Lightheaded:  yes rarely  Syncope: no Dyskinesia:  no   Neuroimaging has  previously been performed.  An MRI brain was last done in 2010 but pt can no longer have MRI's because of an implantable hearing device.    CT HEAD WITHOUT CONTRAST CT MAXILLOFACIAL WITHOUT CONTRAST    Technique:  Multidetector CT imaging of the head and maxillofacial structures were performed using the standard protocol without intravenous contrast. Multiplanar CT image reconstructions of the maxillofacial structures were also generated.   Comparison:  MRI brain dated 12/04/2008   CT HEAD   Findings: No evidence of parenchymal hemorrhage or extra-axial fluid collection. No mass lesion, mass effect, or midline shift.   No CT evidence of acute infarction.   Subcortical white matter and periventricular small vessel ischemic changes.  Intracranial atherosclerosis.   Global cortical atrophy, possibly age appropriate.  No ventriculomegaly.   The visualized paranasal sinuses are essentially clear.  The left mastoid air cells are unopacified.  Prior right mastoidectomy.   Metallic device in the right occipital scalp (series 8/image 21). No evidence of calvarial fracture.   IMPRESSION: No evidence of acute intracranial abnormality.   Atrophy with small vessel ischemic changes and intracranial atherosclerosis.   CT MAXILLOFACIAL   Findings:   No evidence of maxillofacial fracture.   The visualized paranasal sinuses are essentially clear.  The left mastoid air cells are unopacified.  Prior right mastoidectomy.   The bilateral orbits, including the retroconal soft tissues, are within normal limits.   The visualized cervical spine is intact to the bottom of C7, noting mild multilevel degenerative changes.   IMPRESSION:  No evidence of maxillofacial fracture. A CT brain was recently performed:    Clinical Data: Alzheimer's. Right hand tremors. History of  lymphoma.  MRI HEAD WITHOUT AND WITH CONTRAST  Technique: Multiplanar, multiecho pulse sequences of the brain and  surrounding structures were obtained according to standard protocol  without and with intravenous contrast  Contrast: 7 ml Multihance IV.  Comparison: CT 03/24/2004.  Findings: There is age appropriate  atrophy. This is generalized  atrophy which is not lobar specific. There is moderate patchy  hyperintensity throughout the cerebral white matter bilaterally.  This is compatible with chronic microvascular ischemia. There is  a small cyst in the right basal ganglia. The brainstem and  cerebellum appear normal. Diffusion weighted imaging is negative  for acute infarct. Negative for hemorrhage or mass lesion.  Following contrast infusion there is a small venous angioma in the  right lateral frontal lobe. No evidence of edema or hemorrhage is  identified. No enhancing mass lesion is identified. There is  chronic sinusitis with mucosal thickening in the paranasal sinuses.  There also is an air-fluid level in the left frontal sinus.  IMPRESSION:  Age appropriate atrophy with chronic microvascular ischemia. No  acute infarct.  Small venous angioma in the right frontal lobe.  Acute and chronic sinusitis.    PREVIOUS MEDICATIONS: none to date  ALLERGIES:  No Known Allergies  CURRENT MEDICATIONS:  Current Outpatient Prescriptions on File Prior to Visit  Medication Sig Dispense Refill  . acyclovir (ZOVIRAX) 400 MG tablet TAKE 1 TABLET (400 MG TOTAL) BY MOUTH 2 (TWO) TIMES DAILY.  60 tablet  5  . anastrozole (ARIMIDEX) 1 MG tablet TAKE 1 TABLET (1 MG TOTAL) BY MOUTH DAILY.  90 tablet  1  . buPROPion (WELLBUTRIN XL) 300 MG 24 hr tablet Take 300 mg by mouth daily.      . Calcium Carbonate-Vitamin D (CALCIUM 600 + D PO) Take 1 capsule by mouth daily.       . carbidopa-levodopa (SINEMET IR) 25-100 MG per tablet Take 1 tablet by mouth 3 (three) times daily.  90 tablet  0  . CRANBERRY EXTRACT PO Take 1 tablet by mouth daily.        . Fluticasone-Salmeterol (ADVAIR DISKUS) 100-50 MCG/DOSE AEPB Inhale 1 puff into the lungs every 12 (twelve) hours.       [name] loratadine (CLARITIN) 10 MG tablet Take 10 mg by mouth daily. For runny nose as needed      . Multiple Vitamins-Minerals (MULTIVITAMIN WITH MINERALS)  tablet Take 1 tablet by mouth daily.        [name] darifenacin (ENABLEX) 15 MG 24 hr tablet Take 15 mg by mouth daily.       No current facility-administered medications on file prior to visit.    PAST MEDICAL HISTORY:   Past Medical History  Diagnosis Date  . NHL (non-Hodgkin's lymphoma)     nhl dx 9/04 breast ca dx1/12  . Asthma   . Hearing loss   . Diabetes mellitus without complication     09/19/12.Marland KitchenMarland Kitchenpt denies  . GERD (gastroesophageal reflux disease)     PAST SURGICAL HISTORY:   Past Surgical History  Procedure Laterality Date  . Mastectomy  2 /8/ 12    bilateral  . Ba-ha ear implant      SOCIAL HISTORY:   History   Social History  . Marital Status: Married    Spouse Name: N/A    Number of Children: N/A  . Years of Education: N/A  Occupational History  . 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 Smoking status: Never Smoker   . Smokeless tobacco: Never Used     Comment: husband wsa smoker  . Alcohol Use: No     Comment: no  . Drug Use: No  . Sexual Activity: Not on file   Other Topics Concern  . Not on file   Social History Narrative  . No narrative on file    FAMILY HISTORY:   Family Status  Relation Status Death Age  . Father Deceased     CA, lung  . Mother Deceased     CA; complications of splenectomy  . Brother Deceased     3, renal failure, DM-2; CAD; CA  . Child Alive     5 (4 biological), one with PKU    ROS:  A complete 10 system review of systems was obtained and was unremarkable apart from what is mentioned above.  PHYSICAL EXAMINATION:    VITALS:   Filed Vitals:   03/16/13 1447  BP: 106/70  Pulse: 88  Temp: 97.6 F (36.4 C)  Resp: 20  Weight: 128 lb (58.06 kg)    GEN:  The patient appears stated age and is in NAD. HEENT:  Normocephalic, atraumatic.  The mucous membranes are moist. The superficial temporal arteries are without ropiness or tenderness. CV:  RRR Lungs:  CTAB Neck/HEME:  There are no carotid  bruits bilaterally.  Neurological examination:  Orientation: The patient is alert and oriented x3.  Attention and concentration are normal.    Able to name objects and repeat phrases.  A complete Mini-Mental status examination was performed today and the patient scored a 26/30. Cranial nerves: There is good facial symmetry. Pupils are equal round and reactive to light bilaterally. Fundoscopic exam reveals clear margins bilaterally. Extraocular muscles are intact. The visual fields are full to confrontational testing. The speech is fluent and clear. Soft palate rises symmetrically and there is no tongue deviation. Hearing is intact to conversational tone. Sensation: Sensation is intact to light and pinprick throughout (facial, trunk, extremities). Vibration is decreased at the bilateral big toe. There is no extinction with double simultaneous stimulation. There is no sensory dermatomal level identified. Motor: Strength is 5/5 in the bilateral upper and lower extremities.   Shoulder shrug is equal and symmetric.  There is no pronator drift. Deep tendon reflexes: Deep tendon reflexes are 3/4 at the bilateral biceps, triceps, brachioradialis, patella and trace at the bilateralachilles. Plantar responses are downgoing bilaterally.  Movement examination: Tone: There is increased tone in the right upper extremity, moderate, and mild increased tone in the left upper extremity.  Tone increases with activation procedures.  There does appear to be increased tone in the bilateral lower extremities, but there is a gegenhalten quality to it. Abnormal movements: there is a very rare right upper extremity resting tremor.  The left was normal. Coordination:  There is  virtually no decremation seen with rapid alternating movements today. Gait and Station: The patient has no difficulty arising without the use of her hands.  The patient's stride length is fairly normal.  Labs:  Lab Results  Component Value Date   WBC  4.1 12/26/2012   HGB 12.1 12/26/2012   HCT 36.2 12/26/2012   MCV 87.7 12/26/2012   PLT 134* 12/26/2012     Chemistry      Component Value Date/Time   NA 144 12/26/2012 1110   NA 139 09/20/2012  0058   K 3.9 12/26/2012 1110   K 4.2 09/20/2012 0058   CL 106 12/26/2012 1110   CL 106 09/20/2012 0058   CO2 29 12/26/2012 1110   CO2 31 11/27/2011 1526   BUN 15.9 12/26/2012 1110   BUN 24* 09/20/2012 0058   CREATININE 1.2* 12/26/2012 1110   CREATININE 1.10 09/20/2012 0058      Component Value Date/Time   CALCIUM 9.0 12/26/2012 1110   CALCIUM 9.0 11/27/2011 1526   CALCIUM 11.1* 07/15/2006 1212   ALKPHOS 86 12/26/2012 1110   ALKPHOS 89 11/27/2011 1526   AST 19 12/26/2012 1110   AST 19 11/27/2011 1526   ALT 7 12/26/2012 1110   ALT 16 11/27/2011 1526   BILITOT 0.36 12/26/2012 1110   BILITOT 0.2* 11/27/2011 1526      Lab Results  Component Value Date   VITAMINB12 1053* 06/10/2006   Lab Results  Component Value Date   TSH 2.93 09/19/2012   Reviewed labs that pt brought from 02/17/13.  AST 58, ALT was increased at 106 but PCP felt d/t pneumonia and meds and pt was to redo labs.  ASSESSMENT/PLAN:  1.  Probable idiopathic Parkinson's disease.  This is evidenced by bradykinesia, tremor, postural instability and rigidity.  There are no atypical features.  -We discussed the diagnosis as well as pathophysiology of the disease.  We discussed treatment options as well as prognostic indicators.  Patient education was provided.  -I would like to continue the patient on levodopa therapy.  She is improved on low dose levodopa therapy. Risks, benefits, side effects and alternative therapies were discussed.  The opportunity to ask questions was given and they were answered to the best of my ability.  The patient expressed understanding and willingness to follow the outlined treatment protocols.  -She will be referred back to the Parkinson's program.  -We talked about the importance of exercise, albeit safe exercise. 2.  She does have some  features of REM behavior disorder.  We are going to need to monitor this just to make sure that she is safe.  It sounds like she has not had disruptive behavior in quite some time, but when she did it required stitches. 3.  I talked to her about safety. 4.  I will see her back in early December, sooner should new neurologic issues arise.

## 2013-03-16 NOTE — Patient Instructions (Addendum)
Follow up in December.

## 2013-04-05 DIAGNOSIS — H269 Unspecified cataract: Secondary | ICD-10-CM | POA: Diagnosis not present

## 2013-04-05 DIAGNOSIS — H2589 Other age-related cataract: Secondary | ICD-10-CM | POA: Diagnosis not present

## 2013-04-05 DIAGNOSIS — H2510 Age-related nuclear cataract, unspecified eye: Secondary | ICD-10-CM | POA: Diagnosis not present

## 2013-04-05 DIAGNOSIS — I10 Essential (primary) hypertension: Secondary | ICD-10-CM | POA: Diagnosis not present

## 2013-04-13 DIAGNOSIS — H2510 Age-related nuclear cataract, unspecified eye: Secondary | ICD-10-CM | POA: Diagnosis not present

## 2013-04-13 DIAGNOSIS — I10 Essential (primary) hypertension: Secondary | ICD-10-CM | POA: Diagnosis not present

## 2013-04-17 DIAGNOSIS — H2589 Other age-related cataract: Secondary | ICD-10-CM | POA: Diagnosis not present

## 2013-04-17 DIAGNOSIS — H2510 Age-related nuclear cataract, unspecified eye: Secondary | ICD-10-CM | POA: Diagnosis not present

## 2013-04-17 DIAGNOSIS — H269 Unspecified cataract: Secondary | ICD-10-CM | POA: Diagnosis not present

## 2013-04-26 DIAGNOSIS — L299 Pruritus, unspecified: Secondary | ICD-10-CM | POA: Diagnosis not present

## 2013-04-26 DIAGNOSIS — I10 Essential (primary) hypertension: Secondary | ICD-10-CM | POA: Diagnosis not present

## 2013-05-02 ENCOUNTER — Telehealth: Payer: Self-pay | Admitting: Oncology

## 2013-05-02 ENCOUNTER — Ambulatory Visit (HOSPITAL_BASED_OUTPATIENT_CLINIC_OR_DEPARTMENT_OTHER): Payer: Medicare Other | Admitting: Oncology

## 2013-05-02 ENCOUNTER — Ambulatory Visit (HOSPITAL_COMMUNITY)
Admission: RE | Admit: 2013-05-02 | Discharge: 2013-05-02 | Disposition: A | Discharge status: Home / Self Care | Payer: Medicare Other | Source: Ambulatory Visit | Attending: Oncology | Admitting: Oncology

## 2013-05-02 ENCOUNTER — Other Ambulatory Visit (HOSPITAL_BASED_OUTPATIENT_CLINIC_OR_DEPARTMENT_OTHER): Payer: Medicare Other

## 2013-05-02 VITALS — BP 159/73 | HR 82 | Temp 97.9°F | Resp 20 | Ht 63.0 in | Wt 130.1 lb

## 2013-05-02 DIAGNOSIS — I10 Essential (primary) hypertension: Secondary | ICD-10-CM | POA: Diagnosis not present

## 2013-05-02 DIAGNOSIS — C8589 Other specified types of non-Hodgkin lymphoma, extranodal and solid organ sites: Secondary | ICD-10-CM | POA: Insufficient documentation

## 2013-05-02 DIAGNOSIS — Z23 Encounter for immunization: Secondary | ICD-10-CM

## 2013-05-02 DIAGNOSIS — C50919 Malignant neoplasm of unspecified site of unspecified female breast: Secondary | ICD-10-CM

## 2013-05-02 DIAGNOSIS — D630 Anemia in neoplastic disease: Secondary | ICD-10-CM | POA: Diagnosis not present

## 2013-05-02 DIAGNOSIS — I700 Atherosclerosis of aorta: Secondary | ICD-10-CM | POA: Insufficient documentation

## 2013-05-02 DIAGNOSIS — J984 Other disorders of lung: Secondary | ICD-10-CM | POA: Insufficient documentation

## 2013-05-02 DIAGNOSIS — M4180 Other forms of scoliosis, site unspecified: Secondary | ICD-10-CM | POA: Insufficient documentation

## 2013-05-02 LAB — COMPREHENSIVE METABOLIC PANEL (CC13)
ALT: 17 U/L (ref 0–55)
AST: 28 U/L (ref 5–34)
Calcium: 9.7 mg/dL (ref 8.4–10.4)
Chloride: 106 mEq/L (ref 98–109)
Creatinine: 1 mg/dL (ref 0.6–1.1)
Total Bilirubin: 0.38 mg/dL (ref 0.20–1.20)

## 2013-05-02 LAB — CBC WITH DIFFERENTIAL/PLATELET
BASO%: 0.7 % (ref 0.0–2.0)
LYMPH%: 16.6 % (ref 14.0–49.7)
MCHC: 32.8 g/dL (ref 31.5–36.0)
MONO#: 0.3 10*3/uL (ref 0.1–0.9)
Platelets: 149 10*3/uL (ref 145–400)
RBC: 4.06 10*6/uL (ref 3.70–5.45)
WBC: 4.4 10*3/uL (ref 3.9–10.3)
nRBC: 0 % (ref 0–0)

## 2013-05-02 MED ORDER — INFLUENZA VAC SPLIT QUAD 0.5 ML IM SUSP
0.5000 mL | Freq: Once | INTRAMUSCULAR | Status: AC
  Administered 2013-05-02: 0.5 mL via INTRAMUSCULAR
  Filled 2013-05-02: qty 0.5

## 2013-05-02 NOTE — Progress Notes (Signed)
    [facility]    OFFICE PROGRESS NOTE   INTERVAL HISTORY:   She returns for scheduled visit. She reports being diagnosed with acute bronchitis and "pneumonia "this summer. She was treated with antibiotics and these symptoms resolved. She recently underwent bilateral cataract surgery. Her vision has improved. Good appetite. No dyspnea at present. She is taking Arimidex.  A chest x-ray 02/17/2013 revealed increased bibasilar opacities and a new right upper lobe airspace disease compared to a chest x-ray from 09/19/2012.  Objective:  Vital signs in last 24 hours:  Blood pressure 159/73, pulse 82, temperature 97.9 F (36.6 C), temperature source Oral, resp. rate 20, height 5\' 3"  (1.6 m), weight 130 lb 1.6 oz (59.013 kg).    HEENT: Neck without mass Lymphatics: No cervical, supra-clavicular, axillary, or inguinal nodes Resp: End inspiratory rhonchi with decreased breath sounds at the left base, no respiratory distress. No wheezing. Cardio: Regular rate and rhythm, 2/6 systolic GI: No hepatosplenomegaly, no mass Vascular: No leg edema Breasts: Status post right mastectomy. No evidence for chest wall tumor recurrence. Left breast without mass.  Lab Results:  Lab Results  Component Value Date   WBC 4.4 05/02/2013   HGB 11.6 05/02/2013   HCT 35.4 05/02/2013   MCV 87.2 05/02/2013   PLT 149 05/02/2013   ANC 3.2    Medications: I have reviewed the patient's current medications.  Assessment/Plan: 1. Non-Hodgkin lymphoma treated with fludarabine/rituximab, last in July of 2009.  a. Restaging CT 11/24/2010 revealed evidence of progressive lymphoma in the abdomen and pelvis.  b. Initiation of salvage therapy with bendamustine/rituximab 12/04/2010. She completed 3 cycles.  c. Restaging CT 03/03/2011 revealed stable retroperitoneal soft tissue and a marked decrease in the soft tissue thickening associated with dilated loops of small bowel. 2. History of ITP. Intermittent  mild thrombocytopenia persists. 3. History of Herpes zoster-maintained on prophylactic acyclovir. 4. Anemia secondary to non-Hodgkin lymphoma, chemotherapy, and a history of autoimmune hemolysis. The hemoglobin is stable today. 5. Hypertension. 6. Right-hand tremor/ataxia-followed by neurology, now taking Sinemet 7. Hearing loss-status post placement of an implanted hearing device by Dr. Jac Canavan. 8. Bilateral breast cancer, right breast cancer-grade 1, T1 N0, ER/PR positive, and HER2 negative. Left breast cancer, synchronous grade 2, T2 N0, ER/PR positive, and HER2 negative. She began adjuvant Arimidex on 09/15/2010. 9. Inflammatory changes of both lungs on a CT of the chest 11/24/2010-progressive on the restaging CT 03/03/2011, status post a bronchoscopy 03/17/2011 with no evidence of granulomata or tumor. 10. 11 mm spiculated lesion in the lingula on a CT 11/24/2010-less distinct on the CT 03/03/2011. 11. Upper respiratory infection while visiting her son in [place], June 2013, resolved after treatment with Levaquin/steroids 12. Upper respiratory infection,? Pneumonia summer 2014-chest x-ray 02/17/2013 with bilateral infiltrates   Disposition:  She remains in clinical remission from non-Hodgkin's lymphoma and breast cancer. She will continue Arimidex. We will check a chest x-ray to be sure the lung infiltrates have improved.  Ms. Schmid received an influenza vaccine today. She will ask Dr. Tiburcio Pea when she is due for a pneumococcal vaccine.  Ms. Conkright will return for an office visit in 4 months.   Thornton Papas, MD  05/02/2013  3:44 PM

## 2013-05-02 NOTE — Telephone Encounter (Signed)
schdld pt for [id number] lab and OV Sent pt directly to xray shh

## 2013-05-03 ENCOUNTER — Telehealth: Payer: Self-pay | Admitting: *Deleted

## 2013-05-03 NOTE — Telephone Encounter (Signed)
Message copied by Caleb Popp on Wed May 03, 2013  9:55 AM ------      Message from: Ladene Artist      Created: Tue May 02, 2013  8:36 PM       Please call patient, cxr is better ------

## 2013-05-03 NOTE — Telephone Encounter (Signed)
Informed pt chest X-Ray looks better, per Dr. Truett Perna. She voiced understanding.

## 2013-05-09 DIAGNOSIS — F411 Generalized anxiety disorder: Secondary | ICD-10-CM | POA: Diagnosis not present

## 2013-05-09 DIAGNOSIS — E785 Hyperlipidemia, unspecified: Secondary | ICD-10-CM | POA: Diagnosis not present

## 2013-05-09 DIAGNOSIS — N318 Other neuromuscular dysfunction of bladder: Secondary | ICD-10-CM | POA: Diagnosis not present

## 2013-05-09 DIAGNOSIS — I10 Essential (primary) hypertension: Secondary | ICD-10-CM | POA: Diagnosis not present

## 2013-05-17 ENCOUNTER — Other Ambulatory Visit: Payer: Self-pay

## 2013-05-17 ENCOUNTER — Telehealth: Payer: Self-pay | Admitting: Neurology

## 2013-05-17 DIAGNOSIS — G20 Parkinson's disease: Secondary | ICD-10-CM

## 2013-05-17 NOTE — Telephone Encounter (Signed)
Pt needs new order entered in [facility] for PT, OT, and Speech dx-Parkinson's. Order was entered early in the year but the patient was sick at that time. That order was cancelled or closed and we need to enter a new order. The patient is ready to go forward with the appt / Sherri S.

## 2013-05-17 NOTE — Telephone Encounter (Signed)
New order placed in [facility] ?

## 2013-06-02 DIAGNOSIS — L259 Unspecified contact dermatitis, unspecified cause: Secondary | ICD-10-CM | POA: Diagnosis not present

## 2013-06-02 DIAGNOSIS — L821 Other seborrheic keratosis: Secondary | ICD-10-CM | POA: Diagnosis not present

## 2013-06-02 DIAGNOSIS — I781 Nevus, non-neoplastic: Secondary | ICD-10-CM | POA: Diagnosis not present

## 2013-06-13 ENCOUNTER — Ambulatory Visit: Payer: Medicare Other

## 2013-06-13 ENCOUNTER — Ambulatory Visit: Payer: Medicare Other | Attending: Neurology | Admitting: Physical Therapy

## 2013-06-13 ENCOUNTER — Ambulatory Visit: Payer: Medicare Other | Admitting: Occupational Therapy

## 2013-06-13 DIAGNOSIS — R279 Unspecified lack of coordination: Secondary | ICD-10-CM | POA: Insufficient documentation

## 2013-06-13 DIAGNOSIS — M2420 Disorder of ligament, unspecified site: Secondary | ICD-10-CM | POA: Insufficient documentation

## 2013-06-13 DIAGNOSIS — R471 Dysarthria and anarthria: Secondary | ICD-10-CM | POA: Insufficient documentation

## 2013-06-13 DIAGNOSIS — M629 Disorder of muscle, unspecified: Secondary | ICD-10-CM | POA: Insufficient documentation

## 2013-06-13 DIAGNOSIS — R269 Unspecified abnormalities of gait and mobility: Secondary | ICD-10-CM | POA: Insufficient documentation

## 2013-06-13 DIAGNOSIS — Z5189 Encounter for other specified aftercare: Secondary | ICD-10-CM | POA: Diagnosis not present

## 2013-06-28 ENCOUNTER — Ambulatory Visit: Payer: Medicare Other | Admitting: Occupational Therapy

## 2013-06-28 ENCOUNTER — Ambulatory Visit: Payer: Medicare Other

## 2013-06-28 ENCOUNTER — Ambulatory Visit: Payer: Medicare Other | Attending: Neurology | Admitting: Physical Therapy

## 2013-06-28 ENCOUNTER — Encounter: Payer: Self-pay | Admitting: Neurology

## 2013-06-28 ENCOUNTER — Ambulatory Visit (INDEPENDENT_AMBULATORY_CARE_PROVIDER_SITE_OTHER): Payer: Medicare Other | Admitting: Neurology

## 2013-06-28 VITALS — BP 126/62 | HR 84 | Temp 97.3°F | Resp 14 | Ht 61.5 in | Wt 134.5 lb

## 2013-06-28 DIAGNOSIS — G20 Parkinson's disease: Secondary | ICD-10-CM

## 2013-06-28 DIAGNOSIS — R269 Unspecified abnormalities of gait and mobility: Secondary | ICD-10-CM | POA: Insufficient documentation

## 2013-06-28 DIAGNOSIS — R471 Dysarthria and anarthria: Secondary | ICD-10-CM | POA: Insufficient documentation

## 2013-06-28 DIAGNOSIS — M629 Disorder of muscle, unspecified: Secondary | ICD-10-CM | POA: Diagnosis not present

## 2013-06-28 DIAGNOSIS — R279 Unspecified lack of coordination: Secondary | ICD-10-CM | POA: Insufficient documentation

## 2013-06-28 DIAGNOSIS — Z5189 Encounter for other specified aftercare: Secondary | ICD-10-CM | POA: Diagnosis not present

## 2013-06-28 DIAGNOSIS — M2420 Disorder of ligament, unspecified site: Secondary | ICD-10-CM | POA: Insufficient documentation

## 2013-06-28 MED ORDER — CARBIDOPA-LEVODOPA 25-100 MG PO TABS: 1.0000 | ORAL_TABLET | Freq: Three times a day (TID) | ORAL | Status: DC

## 2013-06-28 NOTE — Progress Notes (Signed)
[name] was seen today in the movement disorders clinic for neurologic consultation at the request of [name].    The consultation is for the evaluation of ataxia and tremor.  The patient has been a previous pt of Dr. Sandria Mathews.  Dr. Sandria Mathews dx the patient with parkinsonism and ET.  This is an 79 y/o female with a complex medical hx of both non Hodgkins lymphoma require chemotherapy and breast CA, s/p bilateral mastectomy.  She has a long hx of tremor.  I reviewed [name] notes that were made available to me.  It appears that she presented with right hand resting tremor in 2009, without associated rigidity or bradykinesia.  There were no known medications causing the problem and it appears that she was initially dx with ET and later transitioned the dx to parkinsonism.  The patient reports no tremor in the L hand.  Her husband reports a little tremor in the R leg.  The patient reports that tremor is most significant with relaxing and she has no tremor if her hands are preoccupied.    She has never been on PD medication or been to PT until yesterday.  03/16/13 update:  The pt presents today with her husband.  She has not been seen since February, 2014 at which time I dx her with PD.  She was started on levodopa 25/100 three times per day.  She did not follow up sooner as she had bronchitis and pneumonia for the last 2 months and is just now recovering.  She did go to PT and liked it but would like to go back.  She is doing well on the levodopa.  Her husband rarely notice tremor any longer.  06/28/13 update:    The pt today presents for f/u with her husband who supplements the hx.  She is on carbidopa/levodopa 25/100 three times per day.  She is doing well but notices R arm tremor now that had gone away.  Rarely she will note tremor in the L hand which she had not noted previously.  She does have some cramping of the feet.  She is unsure if this is associated with wearing off.  She did have some  of this while in my office, and it was time for her to redose the carbidopa/levodopa.  She is now attending therapy, and is enjoying and finding benefit from it.  Wearing off:  Unsure, as above How long before next dose:  Not applicable Falls:   no N/V:  no Hallucinations:  no  visual distortions: no Lightheaded:  yes rarely  Syncope: no Dyskinesia:  no   Neuroimaging has  previously been performed.  An MRI brain was last done in 2010 but pt can no longer have MRI's because of an implantable hearing device.    CT HEAD WITHOUT CONTRAST CT MAXILLOFACIAL WITHOUT CONTRAST   Technique:  Multidetector CT imaging of the head and maxillofacial structures were performed using the standard protocol without intravenous contrast. Multiplanar CT image reconstructions of the maxillofacial structures were also generated.   Comparison:  MRI brain dated 12/04/2008   CT HEAD   Findings: No evidence of parenchymal hemorrhage or extra-axial fluid collection. No mass lesion, mass effect, or midline shift.   No CT evidence of acute infarction.   Subcortical white matter and periventricular small vessel ischemic changes.  Intracranial atherosclerosis.   Global cortical atrophy, possibly age appropriate.  No ventriculomegaly.   The visualized paranasal sinuses are essentially clear.  The left  mastoid air cells are unopacified.  Prior right mastoidectomy.   Metallic device in the right occipital scalp (series 8/image 21). No evidence of calvarial fracture.   IMPRESSION: No evidence of acute intracranial abnormality.   Atrophy with small vessel ischemic changes and intracranial atherosclerosis.   CT MAXILLOFACIAL   Findings:   No evidence of maxillofacial fracture.   The visualized paranasal sinuses are essentially clear.  The left mastoid air cells are unopacified.  Prior right mastoidectomy.   The bilateral orbits, including the retroconal soft tissues, are within normal limits.    The visualized cervical spine is intact to the bottom of C7, noting mild multilevel degenerative changes.   IMPRESSION: No evidence of maxillofacial fracture. A CT brain was recently performed:    Clinical Data: Alzheimer's. Right hand tremors. History of  lymphoma.  MRI HEAD WITHOUT AND WITH CONTRAST  Technique: Multiplanar, multiecho pulse sequences of the brain and  surrounding structures were obtained according to standard protocol  without and with intravenous contrast  Contrast: 7 ml Multihance IV.  Comparison: CT 03/24/2004.  Findings: There is age appropriate atrophy. This is generalized  atrophy which is not lobar specific. There is moderate patchy  hyperintensity throughout the cerebral white matter bilaterally.  This is compatible with chronic microvascular ischemia. There is  a small cyst in the right basal ganglia. The brainstem and  cerebellum appear normal. Diffusion weighted imaging is negative  for acute infarct. Negative for hemorrhage or mass lesion.  Following contrast infusion there is a small venous angioma in the  right lateral frontal lobe. No evidence of edema or hemorrhage is  identified. No enhancing mass lesion is identified. There is  chronic sinusitis with mucosal thickening in the paranasal sinuses.  There also is an air-fluid level in the left frontal sinus.  IMPRESSION:  Age appropriate atrophy with chronic microvascular ischemia. No  acute infarct.  Small venous angioma in the right frontal lobe.  Acute and chronic sinusitis.    PREVIOUS MEDICATIONS: none to date  ALLERGIES:  No Known Allergies  CURRENT MEDICATIONS:  Current Outpatient Prescriptions on File Prior to Visit  Medication Sig Dispense Refill  . acyclovir (ZOVIRAX) 400 MG tablet TAKE 1 TABLET (400 MG TOTAL) BY MOUTH 2 (TWO) TIMES DAILY.  60 tablet  5  . amLODipine (NORVASC) 5 MG tablet Take 5 mg by mouth daily.      [name] anastrozole (ARIMIDEX) 1 MG tablet TAKE 1 TABLET (1 MG  TOTAL) BY MOUTH DAILY.  90 tablet  1  . buPROPion (WELLBUTRIN XL) 300 MG 24 hr tablet Take 300 mg by mouth daily.      . Calcium Carbonate-Vitamin D (CALCIUM 600 + D PO) Take 1 capsule by mouth daily.       . carbidopa-levodopa (SINEMET IR) 25-100 MG per tablet Take 1 tablet by mouth 3 (three) times daily.  270 tablet  1  . darifenacin (ENABLEX) 15 MG 24 hr tablet Take 15 mg by mouth daily.      . Fluticasone-Salmeterol (ADVAIR DISKUS) 100-50 MCG/DOSE AEPB Inhale 1 puff into the lungs every 12 (twelve) hours.       [name] losartan-hydrochlorothiazide (HYZAAR) 100-12.5 MG per tablet Take 1 tablet by mouth daily.      . Multiple Vitamins-Minerals (MULTIVITAMIN WITH MINERALS) tablet Take 1 tablet by mouth daily.         No current facility-administered medications on file prior to visit.    PAST MEDICAL HISTORY:   Past Medical History  Diagnosis Date  .  NHL (non-Hodgkin's lymphoma)     nhl dx 9/04 breast ca dx1/12  . Asthma   . Hearing loss   . Diabetes mellitus without complication     09/19/12.Marland KitchenMarland Kitchenpt denies  . GERD (gastroesophageal reflux disease)     PAST SURGICAL HISTORY:   Past Surgical History  Procedure Laterality Date  . Mastectomy  2 /8/ 12    bilateral  . Ba-ha ear implant      SOCIAL HISTORY:   History   Social History  . Marital Status: Married    Spouse Name: N/A    Number of Children: N/A  . Years of Education: N/A   Occupational History  . 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 Smoking status: Never Smoker   . Smokeless tobacco: Never Used     Comment: husband wsa smoker  . Alcohol Use: No     Comment: no  . Drug Use: No  . Sexual Activity: Not on file   Other Topics Concern  . Not on file   Social History Narrative  . No narrative on file    FAMILY HISTORY:   Family Status  Relation Status Death Age  . Father Deceased     CA, lung  . Mother Deceased     CA; complications of splenectomy  . Brother Deceased     3, renal  failure, DM-2; CAD; CA  . Child Alive     5 (4 biological), one with PKU    ROS:  A complete 10 system review of systems was obtained and was unremarkable apart from what is mentioned above.  PHYSICAL EXAMINATION:    VITALS:   Filed Vitals:   06/28/13 1050  BP: 126/62  Pulse: 84  Temp: 97.3 F (36.3 C)  Resp: 14  Height: 5' 1.5" (1.562 m)  Weight: 134 lb 8 oz (61.009 kg)    GEN:  The patient appears stated age and is in NAD. HEENT:  Normocephalic, atraumatic.  The mucous membranes are moist. The superficial temporal arteries are without ropiness or tenderness. CV:  RRR Lungs:  CTAB Neck/HEME:  There are no carotid bruits bilaterally.  Neurological examination:  Orientation: The patient is alert and oriented x3.  Attention and concentration are normal.    Able to name objects and repeat phrases.  A complete Mini-Mental status examination was performed in August, 2014 and the patient scored a 26/30. Cranial nerves: There is good facial symmetry. Pupils are equal round and reactive to light bilaterally. Fundoscopic exam reveals clear margins bilaterally. Extraocular muscles are intact. The visual fields are full to confrontational testing. The speech is fluent and clear. Soft palate rises symmetrically and there is no tongue deviation. Hearing is intact to conversational tone. Sensation: Sensation is intact to light and pinprick throughout (facial, trunk, extremities). Vibration is decreased at the bilateral big toe. There is no extinction with double simultaneous stimulation. There is no sensory dermatomal level identified. Motor: Strength is 5/5 in the bilateral upper and lower extremities.   Shoulder shrug is equal and symmetric.  There is no pronator drift. Deep tendon reflexes: Deep tendon reflexes are 3/4 at the bilateral biceps, triceps, brachioradialis, patella and trace at the bilateralachilles. Plantar responses are downgoing bilaterally.  Movement examination: Tone: There  is very mild increased tone in the bilateral upper extremities.  Tone increases with activation procedures.  There does appear to be increased tone in the bilateral lower extremities, but there is a gegenhalten quality  to it. Abnormal movements: there is a very rare right upper extremity resting tremor.  The left was normal. Coordination:  There is  virtually no decremation seen with rapid alternating movements today. Gait and Station: The patient has no difficulty arising without the use of her hands.  The patient's stride length is fairly normal.  Labs:  Lab Results  Component Value Date   WBC 4.4 05/02/2013   HGB 11.6 05/02/2013   HCT 35.4 05/02/2013   MCV 87.2 05/02/2013   PLT 149 05/02/2013     Chemistry      Component Value Date/Time   NA 143 05/02/2013 1106   NA 139 09/20/2012 0058   K 4.3 05/02/2013 1106   K 4.2 09/20/2012 0058   CL 106 12/26/2012 1110   CL 106 09/20/2012 0058   CO2 30* 05/02/2013 1106   CO2 31 11/27/2011 1526   BUN 20.4 05/02/2013 1106   BUN 24* 09/20/2012 0058   CREATININE 1.0 05/02/2013 1106   CREATININE 1.10 09/20/2012 0058      Component Value Date/Time   CALCIUM 9.7 05/02/2013 1106   CALCIUM 9.0 11/27/2011 1526   CALCIUM 11.1* 07/15/2006 1212   ALKPHOS 113 05/02/2013 1106   ALKPHOS 89 11/27/2011 1526   AST 28 05/02/2013 1106   AST 19 11/27/2011 1526   ALT 17 05/02/2013 1106   ALT 16 11/27/2011 1526   BILITOT 0.38 05/02/2013 1106   BILITOT 0.2* 11/27/2011 1526      Lab Results  Component Value Date   VITAMINB12 1053* 06/10/2006   Lab Results  Component Value Date   TSH 2.93 09/19/2012   Reviewed labs that pt brought from 02/17/13.  AST 58, ALT was increased at 106 but PCP felt d/t pneumonia and meds and pt was to redo labs.  ASSESSMENT/PLAN:  1.  Idiopathic Parkinson's disease, dx 08/2012.  This is evidenced by bradykinesia, tremor, postural instability and rigidity.  There are no atypical features.  -We discussed the diagnosis as well as pathophysiology of the  disease.  We discussed treatment options as well as prognostic indicators.  Patient education was provided.  -I would like to continue the patient on levodopa therapy.  She is improved on low dose levodopa therapy. Risks, benefits, side effects and alternative therapies were discussed.  The opportunity to ask questions was given and they were answered to the best of my ability.  The patient expressed understanding and willingness to follow the outlined treatment protocols.  -She will be continue with the Parkinson's program.  -We talked about the importance of exercise, albeit safe exercise.  -I. asked her to keep track of cramping of the feet to see if this is purely wearing off phenomenon.  If so, then we will add entacapone.  She is to call me in 2 weeks. 2.  She does have some features of REM behavior disorder.  We are going to need to monitor this just to make sure that she is safe.  It sounds like she has not had disruptive behavior in quite some time, but when she did it required stitches. 3.  I talked to her about safety. 4.  I will see her back in early February, sooner should new neurologic issues arise.

## 2013-07-03 ENCOUNTER — Other Ambulatory Visit: Payer: Self-pay | Admitting: Oncology

## 2013-07-04 ENCOUNTER — Ambulatory Visit: Payer: Medicare Other | Admitting: Occupational Therapy

## 2013-07-04 ENCOUNTER — Ambulatory Visit: Payer: Medicare Other | Admitting: Physical Therapy

## 2013-07-04 ENCOUNTER — Ambulatory Visit: Payer: Medicare Other

## 2013-07-04 DIAGNOSIS — R471 Dysarthria and anarthria: Secondary | ICD-10-CM | POA: Diagnosis not present

## 2013-07-04 DIAGNOSIS — R269 Unspecified abnormalities of gait and mobility: Secondary | ICD-10-CM | POA: Diagnosis not present

## 2013-07-04 DIAGNOSIS — R279 Unspecified lack of coordination: Secondary | ICD-10-CM | POA: Diagnosis not present

## 2013-07-04 DIAGNOSIS — M629 Disorder of muscle, unspecified: Secondary | ICD-10-CM | POA: Diagnosis not present

## 2013-07-04 DIAGNOSIS — Z5189 Encounter for other specified aftercare: Secondary | ICD-10-CM | POA: Diagnosis not present

## 2013-07-06 DIAGNOSIS — H532 Diplopia: Secondary | ICD-10-CM | POA: Diagnosis not present

## 2013-07-10 ENCOUNTER — Other Ambulatory Visit: Payer: Self-pay | Admitting: Oncology

## 2013-07-11 ENCOUNTER — Encounter: Payer: Medicare Other | Admitting: Occupational Therapy

## 2013-07-11 ENCOUNTER — Ambulatory Visit: Payer: Medicare Other | Admitting: Physical Therapy

## 2013-07-12 ENCOUNTER — Ambulatory Visit: Payer: Medicare Other | Admitting: Physical Therapy

## 2013-07-12 ENCOUNTER — Ambulatory Visit: Payer: Medicare Other

## 2013-07-12 ENCOUNTER — Telehealth: Payer: Self-pay

## 2013-07-12 ENCOUNTER — Ambulatory Visit: Payer: Medicare Other | Admitting: Occupational Therapy

## 2013-07-12 DIAGNOSIS — R269 Unspecified abnormalities of gait and mobility: Secondary | ICD-10-CM | POA: Diagnosis not present

## 2013-07-12 DIAGNOSIS — Z5189 Encounter for other specified aftercare: Secondary | ICD-10-CM | POA: Diagnosis not present

## 2013-07-12 DIAGNOSIS — R279 Unspecified lack of coordination: Secondary | ICD-10-CM | POA: Diagnosis not present

## 2013-07-12 DIAGNOSIS — M629 Disorder of muscle, unspecified: Secondary | ICD-10-CM | POA: Diagnosis not present

## 2013-07-12 DIAGNOSIS — R471 Dysarthria and anarthria: Secondary | ICD-10-CM | POA: Diagnosis not present

## 2013-07-12 MED ORDER — ENTACAPONE 200 MG PO TABS: 200.0000 mg | ORAL_TABLET | Freq: Three times a day (TID) | ORAL | Status: DC

## 2013-07-12 NOTE — Telephone Encounter (Signed)
Called pt and relayed your message. 

## 2013-07-12 NOTE — Telephone Encounter (Signed)
Ok, but I wanted to know if this was a cramping associated with when her meds were wearing off (i.e. Time for the next dose) or if it is anytime.  Did she keep track of the timing?

## 2013-07-12 NOTE — Telephone Encounter (Signed)
Pt called per Dr.Tat's instructions to give an update on her condition. She said that she is still having a slight ache across the top of her right foot.

## 2013-07-12 NOTE — Telephone Encounter (Signed)
Called pt back and asked your questions. She said that it is happening when the meds are wearing off. It is the worst at noon.

## 2013-07-12 NOTE — Telephone Encounter (Signed)
[name], have pt start entacapone WITH each dose of levodopa, so she will take it tid with her levodopa.   I have already sent it in to the pharmacy.  Let me know if she gets any diarrhea.

## 2013-07-13 DIAGNOSIS — L219 Seborrheic dermatitis, unspecified: Secondary | ICD-10-CM | POA: Diagnosis not present

## 2013-07-13 DIAGNOSIS — L821 Other seborrheic keratosis: Secondary | ICD-10-CM | POA: Diagnosis not present

## 2013-07-25 ENCOUNTER — Ambulatory Visit: Payer: Medicare Other | Admitting: Physical Therapy

## 2013-07-25 ENCOUNTER — Ambulatory Visit: Payer: Medicare Other | Admitting: Occupational Therapy

## 2013-07-25 DIAGNOSIS — Z5189 Encounter for other specified aftercare: Secondary | ICD-10-CM | POA: Diagnosis not present

## 2013-07-25 DIAGNOSIS — R269 Unspecified abnormalities of gait and mobility: Secondary | ICD-10-CM | POA: Diagnosis not present

## 2013-07-25 DIAGNOSIS — R471 Dysarthria and anarthria: Secondary | ICD-10-CM | POA: Diagnosis not present

## 2013-07-25 DIAGNOSIS — M629 Disorder of muscle, unspecified: Secondary | ICD-10-CM | POA: Diagnosis not present

## 2013-07-25 DIAGNOSIS — R279 Unspecified lack of coordination: Secondary | ICD-10-CM | POA: Diagnosis not present

## 2013-07-28 ENCOUNTER — Telehealth: Payer: Self-pay

## 2013-07-28 NOTE — Telephone Encounter (Signed)
Yes, I called pt and scheduled the appt.

## 2013-07-28 NOTE — Telephone Encounter (Signed)
These aren't the symptoms that the entacapone was to help with.  I gave that to her because of the foot cramping.  Is that better?

## 2013-07-28 NOTE — Telephone Encounter (Signed)
Pt called to give an update on her condition. She said the entacapone is not helping her symptoms, her rt leg is worse and the pain shoots up to right behind her knee when she stands up or sits down. Could this be PD related? Please advise.

## 2013-07-28 NOTE — Telephone Encounter (Signed)
Pt said the cramping has gotten worse.

## 2013-07-28 NOTE — Telephone Encounter (Signed)
Sounds like she needs to come in sooner.  Can she come in 13th at 1pm?

## 2013-08-08 ENCOUNTER — Other Ambulatory Visit: Payer: Self-pay | Admitting: Oncology

## 2013-08-08 ENCOUNTER — Ambulatory Visit (INDEPENDENT_AMBULATORY_CARE_PROVIDER_SITE_OTHER): Payer: Medicare Other | Admitting: Neurology

## 2013-08-08 ENCOUNTER — Encounter: Payer: Self-pay | Admitting: Neurology

## 2013-08-08 VITALS — BP 130/68 | HR 60 | Temp 97.5°F | Resp 12 | Ht 63.0 in | Wt 134.7 lb

## 2013-08-08 DIAGNOSIS — G20 Parkinson's disease: Secondary | ICD-10-CM

## 2013-08-08 DIAGNOSIS — IMO0002 Reserved for concepts with insufficient information to code with codable children: Secondary | ICD-10-CM | POA: Diagnosis not present

## 2013-08-08 DIAGNOSIS — M5416 Radiculopathy, lumbar region: Secondary | ICD-10-CM

## 2013-08-08 DIAGNOSIS — C8589 Other specified types of non-Hodgkin lymphoma, extranodal and solid organ sites: Secondary | ICD-10-CM

## 2013-08-08 MED ORDER — PREGABALIN 75 MG PO CAPS: ORAL_CAPSULE | ORAL | Status: DC

## 2013-08-08 NOTE — Progress Notes (Signed)
[name] was seen today in the movement disorders clinic for neurologic consultation at the request of Dr. Betsy Coder.    The consultation is for the evaluation of ataxia and tremor.  The patient has been a previous pt of Dr. Erling Cruz.  Dr. Erling Cruz dx the patient with parkinsonism and ET.  This is an 80 y/o female with a complex medical hx of both non Hodgkins lymphoma require chemotherapy and breast CA, s/p bilateral mastectomy.  She has a long hx of tremor.  I reviewed Dr. Bernardo Heater notes that were made available to me.  It appears that she presented with right hand resting tremor in 2009, without associated rigidity or bradykinesia.  There were no known medications causing the problem and it appears that she was initially dx with ET and later transitioned the dx to parkinsonism.  The patient reports no tremor in the L hand.  Her husband reports a little tremor in the R leg.  The patient reports that tremor is most significant with relaxing and she has no tremor if her hands are preoccupied.    She has never been on PD medication or been to PT until yesterday.  03/16/13 update:  The pt presents today with her husband.  She has not been seen since February, 2014 at which time I dx her with PD.  She was started on levodopa 25/100 three times per day.  She did not follow up sooner as she had bronchitis and pneumonia for the last 2 months and is just now recovering.  She did go to PT and liked it but would like to go back.  She is doing well on the levodopa.  Her husband rarely notice tremor any longer.  06/28/13 update:    The pt today presents for f/u with her husband who supplements the hx.  She is on carbidopa/levodopa 25/100 three times per day.  She is doing well but notices R arm tremor now that had gone away.  Rarely she will note tremor in the L hand which she had not noted previously.  She does have some cramping of the feet.  She is unsure if this is associated with wearing off.  She did have some  of this while in my office, and it was time for her to redose the carbidopa/levodopa.  She is now attending therapy, and is enjoying and finding benefit from it.  08/08/12 update:  The patient presents today with her husband, who supplements the history.  The patient has Parkinson's disease and is currently on carbidopa/levodopa 25/100, one tablet 3 times per day.  Last visit, she was describing some cramping in her feet and I was trying to figure out whether or not this was an "off" phenomenon.  After paying attention, the patient called and said that she thought it was and we added entacapone.  The cramping in the feet is better but she is now having a stabbing and shooting pain down the back of the leg and to the lateral side of the lower leg that stops at the ankle.  It has stopped her from exercising.  She sometimes has back pain.  This is fairly inconsistent.  The leg pain seems to start about 4 days after she finished rehabilitation, which did seem to help.  She is not sure if the pain is related to the rehabilitation, however.  Wearing off:  No How long before next dose:  Not applicable Falls:   no N/V:  no Hallucinations:  no  visual distortions: no Lightheaded:  yes rarely  Syncope: no Dyskinesia:  no   Neuroimaging has  previously been performed.  An MRI brain was last done in 2010 but pt can no longer have MRI's because of an implantable hearing device.    CT HEAD WITHOUT CONTRAST CT MAXILLOFACIAL WITHOUT CONTRAST   Technique:  Multidetector CT imaging of the head and maxillofacial structures were performed using the standard protocol without intravenous contrast. Multiplanar CT image reconstructions of the maxillofacial structures were also generated.   Comparison:  MRI brain dated 12/04/2008   CT HEAD   Findings: No evidence of parenchymal hemorrhage or extra-axial fluid collection. No mass lesion, mass effect, or midline shift.   No CT evidence of acute infarction.    Subcortical white matter and periventricular small vessel ischemic changes.  Intracranial atherosclerosis.   Global cortical atrophy, possibly age appropriate.  No ventriculomegaly.   The visualized paranasal sinuses are essentially clear.  The left mastoid air cells are unopacified.  Prior right mastoidectomy.   Metallic device in the right occipital scalp (series 8/image 21). No evidence of calvarial fracture.   IMPRESSION: No evidence of acute intracranial abnormality.   Atrophy with small vessel ischemic changes and intracranial atherosclerosis.   CT MAXILLOFACIAL   Findings:   No evidence of maxillofacial fracture.   The visualized paranasal sinuses are essentially clear.  The left mastoid air cells are unopacified.  Prior right mastoidectomy.   The bilateral orbits, including the retroconal soft tissues, are within normal limits.   The visualized cervical spine is intact to the bottom of C7, noting mild multilevel degenerative changes.   IMPRESSION: No evidence of maxillofacial fracture. A CT brain was recently performed:    Clinical Data: Alzheimer's. Right hand tremors. History of  lymphoma.  MRI HEAD WITHOUT AND WITH CONTRAST  Technique: Multiplanar, multiecho pulse sequences of the brain and  surrounding structures were obtained according to standard protocol  without and with intravenous contrast  Contrast: 7 ml Multihance IV.  Comparison: CT 03/24/2004.  Findings: There is age appropriate atrophy. This is generalized  atrophy which is not lobar specific. There is moderate patchy  hyperintensity throughout the cerebral white matter bilaterally.  This is compatible with chronic microvascular ischemia. There is  a small cyst in the right basal ganglia. The brainstem and  cerebellum appear normal. Diffusion weighted imaging is negative  for acute infarct. Negative for hemorrhage or mass lesion.  Following contrast infusion there is a small venous angioma  in the  right lateral frontal lobe. No evidence of edema or hemorrhage is  identified. No enhancing mass lesion is identified. There is  chronic sinusitis with mucosal thickening in the paranasal sinuses.  There also is an air-fluid level in the left frontal sinus.  IMPRESSION:  Age appropriate atrophy with chronic microvascular ischemia. No  acute infarct.  Small venous angioma in the right frontal lobe.  Acute and chronic sinusitis.    PREVIOUS MEDICATIONS: none to date  ALLERGIES:  No Known Allergies  CURRENT MEDICATIONS:  Current Outpatient Prescriptions on File Prior to Visit  Medication Sig Dispense Refill  . amLODipine (NORVASC) 5 MG tablet Take 5 mg by mouth daily.      [name] anastrozole (ARIMIDEX) 1 MG tablet TAKE 1 TABLET (1 MG TOTAL) BY MOUTH DAILY.  90 tablet  3  . buPROPion (WELLBUTRIN XL) 300 MG 24 hr tablet Take 300 mg by mouth daily.      . Calcium Carbonate-Vitamin D (CALCIUM 600 + D  PO) Take 1 capsule by mouth daily.       . carbidopa-levodopa (SINEMET IR) 25-100 MG per tablet Take 1 tablet by mouth 3 (three) times daily.  270 tablet  1  . darifenacin (ENABLEX) 15 MG 24 hr tablet Take 15 mg by mouth daily.      . entacapone (COMTAN) 200 MG tablet Take 1 tablet (200 mg total) by mouth 3 (three) times daily.  90 tablet  3  . Fluticasone-Salmeterol (ADVAIR DISKUS) 100-50 MCG/DOSE AEPB Inhale 1 puff into the lungs every 12 (twelve) hours.       [name] losartan-hydrochlorothiazide (HYZAAR) 100-12.5 MG per tablet Take 1 tablet by mouth daily.      . Multiple Vitamins-Minerals (MULTIVITAMIN WITH MINERALS) tablet Take 1 tablet by mouth daily.         No current facility-administered medications on file prior to visit.    PAST MEDICAL HISTORY:   Past Medical History  Diagnosis Date  . NHL (non-Hodgkin's lymphoma)     nhl dx 9/04 breast ca dx1/12  . Asthma   . Hearing loss   . Diabetes mellitus without complication     07/27/73.Marland KitchenMarland Kitchenpt denies  . GERD (gastroesophageal reflux  disease)     PAST SURGICAL HISTORY:   Past Surgical History  Procedure Laterality Date  . Mastectomy  2 /8/ 12    bilateral  . Ba-ha ear implant      SOCIAL HISTORY:   History   Social History  . Marital Status: Married    Spouse Name: N/A    Number of Children: N/A  . Years of Education: N/A   Occupational History  . retired     Pharmacist, hospital   Social History Main Topics  . Smoking status: Never Smoker   . Smokeless tobacco: Never Used     Comment: husband wsa smoker  . Alcohol Use: No     Comment: no  . Drug Use: No  . Sexual Activity: Not on file   Other Topics Concern  . Not on file   Social History Narrative  . No narrative on file    FAMILY HISTORY:   Family Status  Relation Status Death Age  . Father Deceased     CA, lung  . Mother Deceased     CA; complications of splenectomy  . Brother Deceased     3, renal failure, DM-2; CAD; CA  . Child Alive     5 (4 biological), one with PKU    ROS:  A complete 10 system review of systems was obtained and was unremarkable apart from what is mentioned above.  PHYSICAL EXAMINATION:    VITALS:   Filed Vitals:   08/08/13 1245  BP: 130/68  Pulse: 60  Temp: 97.5 F (36.4 C)  Resp: 12  Height: 5\' 3"  (1.6 m)  Weight: 134 lb 11.2 oz (61.1 kg)    GEN:  The patient appears stated age and is in NAD. HEENT:  Normocephalic, atraumatic.  The mucous membranes are moist. The superficial temporal arteries are without ropiness or tenderness. CV:  RRR Lungs:  CTAB Neck/HEME:  There are no carotid bruits bilaterally.  Neurological examination:  Orientation: The patient is alert and oriented x3.  Attention and concentration are normal.    Able to name objects and repeat phrases.  A complete Mini-Mental status examination was performed in August, 2014 and the patient scored a 26/30. Cranial nerves: There is good facial symmetry. Pupils are equal round and reactive to light bilaterally.  Fundoscopic exam reveals clear  margins bilaterally. Extraocular muscles are intact. The visual fields are full to confrontational testing. The speech is fluent and clear. Soft palate rises symmetrically and there is no tongue deviation. Hearing is intact to conversational tone. Sensation: Sensation is intact to light and pinprick throughout (facial, trunk, extremities). Vibration is decreased at the bilateral big toe. There is no extinction with double simultaneous stimulation. There is no sensory dermatomal level identified. Motor: Strength is 5/5 in the bilateral upper and lower extremities.   Shoulder shrug is equal and symmetric.  There is no pronator drift. Deep tendon reflexes: Deep tendon reflexes are 3/4 at the bilateral biceps, triceps, brachioradialis, patella and trace at the bilateralachilles. Plantar responses are downgoing bilaterally.  Movement examination: Tone: There is normal tone in the upper and lower extremities today. Abnormal movements: there is a very rare right upper extremity resting tremor.  The left was normal. Coordination:  There is  virtually no decremation seen with rapid alternating movements today. Gait and Station: The patient has trouble arising out of the chair without use of her hands today, because of right leg pain.  Gait is very antalgic today. Labs:  Lab Results  Component Value Date   WBC 4.4 05/02/2013   HGB 11.6 05/02/2013   HCT 35.4 05/02/2013   MCV 87.2 05/02/2013   PLT 149 05/02/2013     Chemistry      Component Value Date/Time   NA 143 05/02/2013 1106   NA 139 09/20/2012 0058   K 4.3 05/02/2013 1106   K 4.2 09/20/2012 0058   CL 106 12/26/2012 1110   CL 106 09/20/2012 0058   CO2 30* 05/02/2013 1106   CO2 31 11/27/2011 1526   BUN 20.4 05/02/2013 1106   BUN 24* 09/20/2012 0058   CREATININE 1.0 05/02/2013 1106   CREATININE 1.10 09/20/2012 0058      Component Value Date/Time   CALCIUM 9.7 05/02/2013 1106   CALCIUM 9.0 11/27/2011 1526   CALCIUM 11.1* 07/15/2006 1212   ALKPHOS 113  05/02/2013 1106   ALKPHOS 89 11/27/2011 1526   AST 28 05/02/2013 1106   AST 19 11/27/2011 1526   ALT 17 05/02/2013 1106   ALT 16 11/27/2011 1526   BILITOT 0.38 05/02/2013 1106   BILITOT 0.2* 11/27/2011 1526      Lab Results  Component Value Date   VITAMINB12 [id number]* 06/10/2006   Lab Results  Component Value Date   TSH 2.93 09/19/2012   Reviewed labs that pt brought from 02/17/13.  AST 58, ALT was increased at 106 but PCP felt d/t pneumonia and meds and pt was to redo labs.  ASSESSMENT/PLAN:  1.  Idiopathic Parkinson's disease, dx 08/2012.  This is evidenced by bradykinesia, tremor, postural instability and rigidity.  There are no atypical features.  -We discussed the diagnosis as well as pathophysiology of the disease.  We discussed treatment options as well as prognostic indicators.  Patient education was provided.  -I would like to continue the patient on levodopa therapy and entacapone.  She is improved on low dose levodopa therapy. Risks, benefits, side effects and alternative therapies were discussed.  The opportunity to ask questions was given and they were answered to the best of my ability.  The patient expressed understanding and willingness to follow the outlined treatment protocols. 2.  R L5 radiculopathy vs. Sciatica.  -I. we'll schedule an EMG of the right lower extremity.  Depending on the results of that, we will consider MRI of the  lumbar spine.  -The patient wanted to try some medication before the EMG, as she is having a significant amount of discomfort in the right lower extremity.  We decided to add Lyrica.  Samples were given.  She can take 75 mg twice per day.  Risks, benefits, side effects and alternative therapies were discussed.  The opportunity to ask questions was given and they were answered to the best of my ability.  The patient expressed understanding and willingness to follow the outlined treatment protocols. 3.  I talked to her about safety. 4.  I will see her back in  March, sooner should new neurologic issues arise.

## 2013-08-08 NOTE — Patient Instructions (Addendum)
1.  Start lyrica 75 mg twice per day.   2. We will schedule you for an EMG with Dr Posey Pronto and Dr Vanden Fawaz will see you after.

## 2013-08-14 ENCOUNTER — Telehealth: Payer: Self-pay | Admitting: Neurology

## 2013-08-14 MED ORDER — ENTACAPONE 200 MG PO TABS: 200.0000 mg | ORAL_TABLET | Freq: Three times a day (TID) | ORAL | Status: DC

## 2013-08-14 NOTE — Telephone Encounter (Signed)
Left message for patient or husband to call back and ask for me to let them know of the medication change.

## 2013-08-14 NOTE — Telephone Encounter (Signed)
Patient's husband called to let us know the Lyrica is helping her legs but it is causing her to be drowsy and dizzy. They want to know if she should cut this down to once per day or if there is another medication she could be taking. Please advise.

## 2013-08-14 NOTE — Telephone Encounter (Signed)
Entacapone 200 mg #270 tablets with no refills request received from [facility] on [address]. Per last office note on 08/08/13 - patient to stay on medication. Refill approved and e-scribed to patient's pharmacy.

## 2013-08-14 NOTE — Telephone Encounter (Signed)
Give her RX for the 50 mg instead (she currently has the 75 mg) and take bid.

## 2013-08-15 MED ORDER — PREGABALIN 50 MG PO CAPS: 50.0000 mg | ORAL_CAPSULE | Freq: Two times a day (BID) | ORAL | Status: DC

## 2013-08-15 NOTE — Telephone Encounter (Signed)
Patient's husband called back- made him aware we would like to switch patient to 50 mg BID to see if that reduces side effects. He is in agreement and medication called to [place].

## 2013-08-17 DIAGNOSIS — M5380 Other specified dorsopathies, site unspecified: Secondary | ICD-10-CM | POA: Diagnosis not present

## 2013-08-17 DIAGNOSIS — M545 Low back pain, unspecified: Secondary | ICD-10-CM | POA: Diagnosis not present

## 2013-08-17 DIAGNOSIS — IMO0002 Reserved for concepts with insufficient information to code with codable children: Secondary | ICD-10-CM | POA: Diagnosis not present

## 2013-08-17 DIAGNOSIS — M999 Biomechanical lesion, unspecified: Secondary | ICD-10-CM | POA: Diagnosis not present

## 2013-08-18 DIAGNOSIS — IMO0002 Reserved for concepts with insufficient information to code with codable children: Secondary | ICD-10-CM | POA: Diagnosis not present

## 2013-08-18 DIAGNOSIS — M545 Low back pain, unspecified: Secondary | ICD-10-CM | POA: Diagnosis not present

## 2013-08-18 DIAGNOSIS — M5380 Other specified dorsopathies, site unspecified: Secondary | ICD-10-CM | POA: Diagnosis not present

## 2013-08-18 DIAGNOSIS — M999 Biomechanical lesion, unspecified: Secondary | ICD-10-CM | POA: Diagnosis not present

## 2013-08-21 DIAGNOSIS — M545 Low back pain, unspecified: Secondary | ICD-10-CM | POA: Diagnosis not present

## 2013-08-21 DIAGNOSIS — M999 Biomechanical lesion, unspecified: Secondary | ICD-10-CM | POA: Diagnosis not present

## 2013-08-21 DIAGNOSIS — IMO0002 Reserved for concepts with insufficient information to code with codable children: Secondary | ICD-10-CM | POA: Diagnosis not present

## 2013-08-21 DIAGNOSIS — M5380 Other specified dorsopathies, site unspecified: Secondary | ICD-10-CM | POA: Diagnosis not present

## 2013-08-23 ENCOUNTER — Telehealth: Payer: Self-pay | Admitting: *Deleted

## 2013-08-23 NOTE — Telephone Encounter (Signed)
sw pt husband gv appt for 08/31/13 however he requested for his spouse to come in on 09/14/13 w/ labs@ 10:30am and ov@ 11am....td

## 2013-08-24 DIAGNOSIS — M545 Low back pain, unspecified: Secondary | ICD-10-CM | POA: Diagnosis not present

## 2013-08-24 DIAGNOSIS — M5380 Other specified dorsopathies, site unspecified: Secondary | ICD-10-CM | POA: Diagnosis not present

## 2013-08-24 DIAGNOSIS — IMO0002 Reserved for concepts with insufficient information to code with codable children: Secondary | ICD-10-CM | POA: Diagnosis not present

## 2013-08-24 DIAGNOSIS — M999 Biomechanical lesion, unspecified: Secondary | ICD-10-CM | POA: Diagnosis not present

## 2013-08-28 ENCOUNTER — Telehealth: Payer: Self-pay | Admitting: Neurology

## 2013-08-28 DIAGNOSIS — H4910 Fourth [trochlear] nerve palsy, unspecified eye: Secondary | ICD-10-CM | POA: Diagnosis not present

## 2013-08-28 NOTE — Telephone Encounter (Signed)
Husband called to cancel EMG appt. He says they would like to speak with Dr. [name] further before rescheduling the appt. Pt has a follow up appt w/ Dr. [name] in March / Venida Jarvis

## 2013-08-29 DIAGNOSIS — M545 Low back pain, unspecified: Secondary | ICD-10-CM | POA: Diagnosis not present

## 2013-08-29 DIAGNOSIS — M5380 Other specified dorsopathies, site unspecified: Secondary | ICD-10-CM | POA: Diagnosis not present

## 2013-08-29 DIAGNOSIS — IMO0002 Reserved for concepts with insufficient information to code with codable children: Secondary | ICD-10-CM | POA: Diagnosis not present

## 2013-08-29 DIAGNOSIS — M999 Biomechanical lesion, unspecified: Secondary | ICD-10-CM | POA: Diagnosis not present

## 2013-08-30 ENCOUNTER — Ambulatory Visit: Payer: Medicare Other | Admitting: Neurology

## 2013-08-31 ENCOUNTER — Other Ambulatory Visit: Payer: Medicare Other

## 2013-08-31 ENCOUNTER — Ambulatory Visit: Payer: Medicare Other | Admitting: Oncology

## 2013-08-31 DIAGNOSIS — M999 Biomechanical lesion, unspecified: Secondary | ICD-10-CM | POA: Diagnosis not present

## 2013-08-31 DIAGNOSIS — IMO0002 Reserved for concepts with insufficient information to code with codable children: Secondary | ICD-10-CM | POA: Diagnosis not present

## 2013-08-31 DIAGNOSIS — M545 Low back pain, unspecified: Secondary | ICD-10-CM | POA: Diagnosis not present

## 2013-08-31 DIAGNOSIS — M5380 Other specified dorsopathies, site unspecified: Secondary | ICD-10-CM | POA: Diagnosis not present

## 2013-09-04 ENCOUNTER — Encounter: Payer: Medicare Other | Admitting: Neurology

## 2013-09-05 DIAGNOSIS — M545 Low back pain, unspecified: Secondary | ICD-10-CM | POA: Diagnosis not present

## 2013-09-05 DIAGNOSIS — IMO0002 Reserved for concepts with insufficient information to code with codable children: Secondary | ICD-10-CM | POA: Diagnosis not present

## 2013-09-05 DIAGNOSIS — M5380 Other specified dorsopathies, site unspecified: Secondary | ICD-10-CM | POA: Diagnosis not present

## 2013-09-05 DIAGNOSIS — M999 Biomechanical lesion, unspecified: Secondary | ICD-10-CM | POA: Diagnosis not present

## 2013-09-06 DIAGNOSIS — F3289 Other specified depressive episodes: Secondary | ICD-10-CM | POA: Diagnosis not present

## 2013-09-06 DIAGNOSIS — M899 Disorder of bone, unspecified: Secondary | ICD-10-CM | POA: Diagnosis not present

## 2013-09-06 DIAGNOSIS — F329 Major depressive disorder, single episode, unspecified: Secondary | ICD-10-CM | POA: Diagnosis not present

## 2013-09-06 DIAGNOSIS — M949 Disorder of cartilage, unspecified: Secondary | ICD-10-CM | POA: Diagnosis not present

## 2013-09-06 DIAGNOSIS — N318 Other neuromuscular dysfunction of bladder: Secondary | ICD-10-CM | POA: Diagnosis not present

## 2013-09-06 DIAGNOSIS — G20 Parkinson's disease: Secondary | ICD-10-CM | POA: Diagnosis not present

## 2013-09-06 DIAGNOSIS — Z0000 Encounter for general adult medical examination without abnormal findings: Secondary | ICD-10-CM | POA: Diagnosis not present

## 2013-09-06 DIAGNOSIS — I10 Essential (primary) hypertension: Secondary | ICD-10-CM | POA: Diagnosis not present

## 2013-09-06 DIAGNOSIS — E785 Hyperlipidemia, unspecified: Secondary | ICD-10-CM | POA: Diagnosis not present

## 2013-09-14 ENCOUNTER — Other Ambulatory Visit: Payer: Self-pay | Admitting: Oncology

## 2013-09-14 ENCOUNTER — Other Ambulatory Visit: Payer: Medicare Other

## 2013-09-14 ENCOUNTER — Ambulatory Visit: Payer: Medicare Other | Admitting: Oncology

## 2013-09-14 ENCOUNTER — Telehealth: Payer: Self-pay | Admitting: Oncology

## 2013-09-14 DIAGNOSIS — C8589 Other specified types of non-Hodgkin lymphoma, extranodal and solid organ sites: Secondary | ICD-10-CM

## 2013-09-14 NOTE — Telephone Encounter (Signed)
returned pt call no answer...did not cx appt until i speak with pt.

## 2013-09-15 DIAGNOSIS — J209 Acute bronchitis, unspecified: Secondary | ICD-10-CM | POA: Diagnosis not present

## 2013-09-22 ENCOUNTER — Telehealth: Payer: Self-pay | Admitting: Oncology

## 2013-09-22 NOTE — Telephone Encounter (Signed)
s.w. pt husband and gv new d.t. of missed appt per Dr. Benay Spice Staff....ok and aware of new appt

## 2013-09-25 DIAGNOSIS — R350 Frequency of micturition: Secondary | ICD-10-CM | POA: Diagnosis not present

## 2013-09-25 DIAGNOSIS — N3946 Mixed incontinence: Secondary | ICD-10-CM | POA: Diagnosis not present

## 2013-09-25 DIAGNOSIS — R351 Nocturia: Secondary | ICD-10-CM | POA: Diagnosis not present

## 2013-09-27 DIAGNOSIS — J209 Acute bronchitis, unspecified: Secondary | ICD-10-CM | POA: Diagnosis not present

## 2013-09-29 ENCOUNTER — Telehealth: Payer: Self-pay | Admitting: Neurology

## 2013-09-29 NOTE — Telephone Encounter (Signed)
Pt wants to talk to someone about appt for next tues please call [phone]

## 2013-09-29 NOTE — Telephone Encounter (Signed)
Spoke with patient's husband. They wanted to confirm visit was a follow up and not an EMG. I advised it was just a follow up with Dr Tat. They will keep appt for Tuesday.

## 2013-10-03 ENCOUNTER — Ambulatory Visit (INDEPENDENT_AMBULATORY_CARE_PROVIDER_SITE_OTHER): Payer: Medicare Other | Admitting: Neurology

## 2013-10-03 ENCOUNTER — Encounter: Payer: Self-pay | Admitting: Neurology

## 2013-10-03 VITALS — BP 108/62 | HR 80 | Resp 16 | Ht 63.0 in | Wt 126.0 lb

## 2013-10-03 DIAGNOSIS — R110 Nausea: Secondary | ICD-10-CM | POA: Diagnosis not present

## 2013-10-03 DIAGNOSIS — G20 Parkinson's disease: Secondary | ICD-10-CM | POA: Diagnosis not present

## 2013-10-03 NOTE — Progress Notes (Signed)
[name] was seen today in the movement disorders clinic for neurologic consultation at the request of Dr. Betsy Coder.    The consultation is for the evaluation of ataxia and tremor.  The patient has been a previous pt of Dr. Erling Cruz.  Dr. Erling Cruz dx the patient with parkinsonism and ET.  This is an 80 y/o female with a complex medical hx of both non Hodgkins lymphoma require chemotherapy and breast CA, s/p bilateral mastectomy.  She has a long hx of tremor.  I reviewed Dr. Bernardo Heater notes that were made available to me.  It appears that she presented with right hand resting tremor in 2009, without associated rigidity or bradykinesia.  There were no known medications causing the problem and it appears that she was initially dx with ET and later transitioned the dx to parkinsonism.  The patient reports no tremor in the L hand.  Her husband reports a little tremor in the R leg.  The patient reports that tremor is most significant with relaxing and she has no tremor if her hands are preoccupied.    She has never been on PD medication or been to PT until yesterday.  03/16/13 update:  The pt presents today with her husband.  She has not been seen since February, 2014 at which time I dx her with PD.  She was started on levodopa 25/100 three times per day.  She did not follow up sooner as she had bronchitis and pneumonia for the last 2 months and is just now recovering.  She did go to PT and liked it but would like to go back.  She is doing well on the levodopa.  Her husband rarely notice tremor any longer.  06/28/13 update:    The pt today presents for f/u with her husband who supplements the hx.  She is on carbidopa/levodopa 25/100 three times per day.  She is doing well but notices R arm tremor now that had gone away.  Rarely she will note tremor in the L hand which she had not noted previously.  She does have some cramping of the feet.  She is unsure if this is associated with wearing off.  She did have some  of this while in my office, and it was time for her to redose the carbidopa/levodopa.  She is now attending therapy, and is enjoying and finding benefit from it.  08/08/12 update:  The patient presents today with her husband, who supplements the history.  The patient has Parkinson's disease and is currently on carbidopa/levodopa 25/100, one tablet 3 times per day.  Last visit, she was describing some cramping in her feet and I was trying to figure out whether or not this was an "off" phenomenon.  After paying attention, the patient called and said that she thought it was and we added entacapone.  The cramping in the feet is better but she is now having a stabbing and shooting pain down the back of the leg and to the lateral side of the lower leg that stops at the ankle.  It has stopped her from exercising.  She sometimes has back pain.  This is fairly inconsistent.  The leg pain seems to start about 4 days after she finished rehabilitation, which did seem to help.  She is not sure if the pain is related to the rehabilitation, however.  10/03/13 update:  The patient presents today with her husband, who supplements the history.  The patient is currently on carbidopa/levodopa 25/100, one  tablet 3 times per day.  She is also on Comtan 200 mg, one tablet 3 times per day.  From a Parkinson's standpoint, she has been doing well.  No falls.  No hallucinations.  Her husband rarely sees tremor, and that is primarily when she is in bed at night and he will feel it rather than see it.  Last visit, she was complaining of back pain that was likely sciatica.  I ordered an EMG but they ultimately canceled that.  I tried her on Lyrica but that caused her to be excessively sleepy.  She went to the chiropractor and states that she is doing much better from that regard.  Unfortunately, she has had bronchitis and a urinary tract infection.  She has been on several antibiotics, which have caused her to be nauseated.  She has also had  dental work and had several of her teeth pulled.  Because of these things, she has had a decreased appetite and really has not been able to eat.  She had a temporary partial placed, but her appetite still hasn't been what it used to be, primarily because of the nausea from the antibiotic.  She has not been exercising because of being sick.  She is hoping to get back to that soon.  Wearing off:  No How long before next dose:  Not applicable Falls:   no N/V:  no Hallucinations:  no  visual distortions: no Lightheaded:  yes rarely  Syncope: no Dyskinesia:  no   Neuroimaging has  previously been performed.  An MRI brain was last done in 2010 but pt can no longer have MRI's because of an implantable hearing device.    CT HEAD WITHOUT CONTRAST CT MAXILLOFACIAL WITHOUT CONTRAST   Technique:  Multidetector CT imaging of the head and maxillofacial structures were performed using the standard protocol without intravenous contrast. Multiplanar CT image reconstructions of the maxillofacial structures were also generated.   Comparison:  MRI brain dated 12/04/2008   CT HEAD   Findings: No evidence of parenchymal hemorrhage or extra-axial fluid collection. No mass lesion, mass effect, or midline shift.   No CT evidence of acute infarction.   Subcortical white matter and periventricular small vessel ischemic changes.  Intracranial atherosclerosis.   Global cortical atrophy, possibly age appropriate.  No ventriculomegaly.   The visualized paranasal sinuses are essentially clear.  The left mastoid air cells are unopacified.  Prior right mastoidectomy.   Metallic device in the right occipital scalp (series 8/image 21). No evidence of calvarial fracture.   IMPRESSION: No evidence of acute intracranial abnormality.   Atrophy with small vessel ischemic changes and intracranial atherosclerosis.   CT MAXILLOFACIAL   Findings:   No evidence of maxillofacial fracture.   The visualized  paranasal sinuses are essentially clear.  The left mastoid air cells are unopacified.  Prior right mastoidectomy.   The bilateral orbits, including the retroconal soft tissues, are within normal limits.   The visualized cervical spine is intact to the bottom of C7, noting mild multilevel degenerative changes.   IMPRESSION: No evidence of maxillofacial fracture. A CT brain was recently performed:    Clinical Data: Alzheimer's. Right hand tremors. History of  lymphoma.  MRI HEAD WITHOUT AND WITH CONTRAST  Technique: Multiplanar, multiecho pulse sequences of the brain and  surrounding structures were obtained according to standard protocol  without and with intravenous contrast  Contrast: 7 ml Multihance IV.  Comparison: CT 03/24/2004.  Findings: There is age appropriate atrophy. This is  generalized  atrophy which is not lobar specific. There is moderate patchy  hyperintensity throughout the cerebral white matter bilaterally.  This is compatible with chronic microvascular ischemia. There is  a small cyst in the right basal ganglia. The brainstem and  cerebellum appear normal. Diffusion weighted imaging is negative  for acute infarct. Negative for hemorrhage or mass lesion.  Following contrast infusion there is a small venous angioma in the  right lateral frontal lobe. No evidence of edema or hemorrhage is  identified. No enhancing mass lesion is identified. There is  chronic sinusitis with mucosal thickening in the paranasal sinuses.  There also is an air-fluid level in the left frontal sinus.  IMPRESSION:  Age appropriate atrophy with chronic microvascular ischemia. No  acute infarct.  Small venous angioma in the right frontal lobe.  Acute and chronic sinusitis.    PREVIOUS MEDICATIONS: none to date  ALLERGIES:  No Known Allergies  CURRENT MEDICATIONS:  Current Outpatient Prescriptions on File Prior to Visit  Medication Sig Dispense Refill  . acyclovir (ZOVIRAX) 400 MG  tablet TAKE 1 TABLET BY MOUTH 2 TIMES DAILY.  60 tablet  0  . amLODipine (NORVASC) 5 MG tablet Take 5 mg by mouth daily.      [name] anastrozole (ARIMIDEX) 1 MG tablet TAKE 1 TABLET (1 MG TOTAL) BY MOUTH DAILY.  90 tablet  3  . buPROPion (WELLBUTRIN XL) 300 MG 24 hr tablet Take 300 mg by mouth daily.      . Calcium Carbonate-Vitamin D (CALCIUM 600 + D PO) Take 1 capsule by mouth daily.       . carbidopa-levodopa (SINEMET IR) 25-100 MG per tablet Take 1 tablet by mouth 3 (three) times daily.  270 tablet  1  . darifenacin (ENABLEX) 15 MG 24 hr tablet Take 15 mg by mouth daily.      . entacapone (COMTAN) 200 MG tablet Take 1 tablet (200 mg total) by mouth 3 (three) times daily.  270 tablet  0  . Fluticasone-Salmeterol (ADVAIR DISKUS) 100-50 MCG/DOSE AEPB Inhale 1 puff into the lungs every 12 (twelve) hours.       [name] losartan-hydrochlorothiazide (HYZAAR) 100-12.5 MG per tablet Take 1 tablet by mouth daily.      . Multiple Vitamins-Minerals (MULTIVITAMIN WITH MINERALS) tablet Take 1 tablet by mouth daily.        . pregabalin (LYRICA) 50 MG capsule Take 1 capsule (50 mg total) by mouth 2 (two) times daily.  60 capsule  2   No current facility-administered medications on file prior to visit.    PAST MEDICAL HISTORY:   Past Medical History  Diagnosis Date  . NHL (non-Hodgkin's lymphoma)     nhl dx 9/04 breast ca dx1/12  . Asthma   . Hearing loss   . Diabetes mellitus without complication     9/62/83.Marland KitchenMarland Kitchenpt denies  . GERD (gastroesophageal reflux disease)     PAST SURGICAL HISTORY:   Past Surgical History  Procedure Laterality Date  . Mastectomy  2 /8/ 12    bilateral  . Ba-ha ear implant      SOCIAL HISTORY:   History   Social History  . Marital Status: Married    Spouse Name: N/A    Number of Children: N/A  . Years of Education: N/A   Occupational History  . retired     Pharmacist, hospital   Social History Main Topics  . Smoking status: Never Smoker   . Smokeless tobacco: Never Used  Comment: husband wsa smoker  . Alcohol Use: No     Comment: no  . Drug Use: No  . Sexual Activity: Not on file   Other Topics Concern  . Not on file   Social History Narrative  . No narrative on file    FAMILY HISTORY:   Family Status  Relation Status Death Age  . Father Deceased     CA, lung  . Mother Deceased     CA; complications of splenectomy  . Brother Deceased     3, renal failure, DM-2; CAD; CA  . Child Alive     5 (4 biological), one with PKU    ROS:  A complete 10 system review of systems was obtained and was unremarkable apart from what is mentioned above.  PHYSICAL EXAMINATION:    VITALS:   Filed Vitals:   10/03/13 1120  BP: 108/62  Pulse: 80  Resp: 16  Height: 5\' 3"  (1.6 m)  Weight: 126 lb (57.153 kg)    GEN:  The patient appears stated age and is in NAD.   Neurological examination:  Orientation: The patient is alert and oriented x3.  Attention and concentration are normal.    Able to name objects and repeat phrases.  A complete Mini-Mental status examination was performed in August, 2014 and the patient scored a 26/30. Cranial nerves: There is good facial symmetry.  The visual fields are full to confrontational testing. The speech is fluent and clear. Soft palate rises symmetrically and there is no tongue deviation. Hearing is intact to conversational tone. Sensation: Sensation is intact to light outh throughout. Motor: Strength is 5/5 in the bilateral upper and lower extremities.   Shoulder shrug is equal and symmetric.  There is no pronator drift. Deep tendon reflexes: Deep tendon reflexes are 3/4 at the bilateral biceps, triceps, brachioradialis, patella and trace at the bilateralachilles. Plantar responses are downgoing bilaterally.  Movement examination: Tone: There is normal tone in the upper and lower extremities today. Abnormal movements: there is a very rare right upper extremity resting tremor.  The left was normal. Coordination:  There is   virtually no decremation seen with rapid alternating movements today. Gait and Station: The patient no has trouble arising out of the chair without use of her hands today.  Stride length and arm swing are good. Labs:  Lab Results  Component Value Date   WBC 4.4 05/02/2013   HGB 11.6 05/02/2013   HCT 35.4 05/02/2013   MCV 87.2 05/02/2013   PLT 149 05/02/2013     Chemistry      Component Value Date/Time   NA 143 05/02/2013 1106   NA 139 09/20/2012 0058   K 4.3 05/02/2013 1106   K 4.2 09/20/2012 0058   CL 106 12/26/2012 1110   CL 106 09/20/2012 0058   CO2 30* 05/02/2013 1106   CO2 31 11/27/2011 1526   BUN 20.4 05/02/2013 1106   BUN 24* 09/20/2012 0058   CREATININE 1.0 05/02/2013 1106   CREATININE 1.10 09/20/2012 0058      Component Value Date/Time   CALCIUM 9.7 05/02/2013 1106   CALCIUM 9.0 11/27/2011 1526   CALCIUM 11.1* 07/15/2006 1212   ALKPHOS 113 05/02/2013 1106   ALKPHOS 89 11/27/2011 1526   AST 28 05/02/2013 1106   AST 19 11/27/2011 1526   ALT 17 05/02/2013 1106   ALT 16 11/27/2011 1526   BILITOT 0.38 05/02/2013 1106   BILITOT 0.2* 11/27/2011 1526      Lab Results  Component Value Date   [id number]* 06/10/2006   Lab Results  Component Value Date   TSH 2.93 09/19/2012   Reviewed labs that pt brought from 02/17/13.  AST 58, ALT was increased at 106 but PCP felt d/t pneumonia and meds and pt was to redo labs.  ASSESSMENT/PLAN:  1.  Idiopathic Parkinson's disease, dx 08/2012.  This is evidenced by bradykinesia, tremor, postural instability and rigidity.  There are no atypical features.  -We discussed the diagnosis as well as pathophysiology of the disease.  We discussed treatment options as well as prognostic indicators.  Patient education was provided.  -I would like to continue the patient on levodopa therapy and entacapone.  She is improved on low dose levodopa therapy. Risks, benefits, side effects and alternative therapies were discussed.  The opportunity to ask questions was given and  they were answered to the best of my ability.  The patient expressed understanding and willingness to follow the outlined treatment protocols. 2.  R L5 radiculopathy vs. Sciatica.  -Resolved after chiropractic treatment 3.  Nausea  -This is likely related to her antibiotic use currently.  I encouraged her to try protein shakes (but not at the same time as her levodopa), as she has not been eating well lately. 4.  I will see her back in July, sooner should new neurologic issues arise.

## 2013-10-03 NOTE — Patient Instructions (Signed)
Follow up in 4 months. Call with any questions prior.

## 2013-10-09 DIAGNOSIS — M899 Disorder of bone, unspecified: Secondary | ICD-10-CM | POA: Diagnosis not present

## 2013-10-09 DIAGNOSIS — M949 Disorder of cartilage, unspecified: Secondary | ICD-10-CM | POA: Diagnosis not present

## 2013-10-13 ENCOUNTER — Other Ambulatory Visit: Payer: Self-pay | Admitting: Oncology

## 2013-10-19 ENCOUNTER — Other Ambulatory Visit (HOSPITAL_BASED_OUTPATIENT_CLINIC_OR_DEPARTMENT_OTHER): Payer: Medicare Other

## 2013-10-19 ENCOUNTER — Telehealth: Payer: Self-pay | Admitting: Oncology

## 2013-10-19 ENCOUNTER — Ambulatory Visit (HOSPITAL_BASED_OUTPATIENT_CLINIC_OR_DEPARTMENT_OTHER): Payer: Medicare Other | Admitting: Oncology

## 2013-10-19 VITALS — BP 137/58 | HR 78 | Temp 97.8°F | Resp 18 | Ht 63.0 in | Wt 126.1 lb

## 2013-10-19 DIAGNOSIS — C50919 Malignant neoplasm of unspecified site of unspecified female breast: Secondary | ICD-10-CM

## 2013-10-19 DIAGNOSIS — C8589 Other specified types of non-Hodgkin lymphoma, extranodal and solid organ sites: Secondary | ICD-10-CM

## 2013-10-19 DIAGNOSIS — C8588 Other specified types of non-Hodgkin lymphoma, lymph nodes of multiple sites: Secondary | ICD-10-CM | POA: Diagnosis not present

## 2013-10-19 DIAGNOSIS — D630 Anemia in neoplastic disease: Secondary | ICD-10-CM | POA: Diagnosis not present

## 2013-10-19 LAB — CBC WITH DIFFERENTIAL/PLATELET
BASO%: 1.1 % (ref 0.0–2.0)
Basophils Absolute: 0.1 10*3/uL (ref 0.0–0.1)
EOS ABS: 0.2 10*3/uL (ref 0.0–0.5)
EOS%: 3.4 % (ref 0.0–7.0)
HCT: 29.8 % — ABNORMAL LOW (ref 34.8–46.6)
HGB: 10 g/dL — ABNORMAL LOW (ref 11.6–15.9)
LYMPH#: 0.7 10*3/uL — AB (ref 0.9–3.3)
LYMPH%: 15.4 % (ref 14.0–49.7)
MCH: 29.4 pg (ref 25.1–34.0)
MCHC: 33.6 g/dL (ref 31.5–36.0)
MCV: 87.6 fL (ref 79.5–101.0)
MONO#: 0.3 10*3/uL (ref 0.1–0.9)
MONO%: 6.8 % (ref 0.0–14.0)
NEUT%: 73.3 % (ref 38.4–76.8)
NEUTROS ABS: 3.4 10*3/uL (ref 1.5–6.5)
NRBC: 0 % (ref 0–0)
Platelets: 139 10*3/uL — ABNORMAL LOW (ref 145–400)
RBC: 3.4 10*6/uL — AB (ref 3.70–5.45)
RDW: 12.8 % (ref 11.2–14.5)
WBC: 4.7 10*3/uL (ref 3.9–10.3)

## 2013-10-19 NOTE — Progress Notes (Signed)
  [place] OFFICE PROGRESS NOTE   Diagnosis: Non-Hodgkin's lymphoma  INTERVAL HISTORY:   She returns as scheduled. She is on new medication for the Parkinson's disease. She continues Arimidex. No hot flashes or arthralgias. She complains of "dizziness ". No vertigo. She had an upper respiratory infection last month.  Objective:  Vital signs in last 24 hours:  Blood pressure 137/58, pulse 78, temperature 97.8 F (36.6 C), temperature source Oral, resp. rate 18, height _0  (1.6 m), weight 126 lb 1.6 oz (57.199 kg), SpO2 98.00%.    HEENT: Neck without mass Lymphatics: No cervical, supra-clavicular, axillary, or inguinal nodes Resp: Lungs clear bilaterally Cardio: Regular rate and rhythm, 2/6 systolic murmur GI: No hepatosplenomegaly, no mass Vascular: No leg edema Neuro: Alert and oriented, finger to nose testing is normal  Breast: Status post bilateral mastectomy. No evidence for local tumor recurrence.     Lab Results:  Lab Results  Component Value Date   WBC 4.7 10/19/2013   HGB 10.0* 10/19/2013   HCT 29.8* 10/19/2013   MCV 87.6 10/19/2013   PLT 139* 10/19/2013   NEUTROABS 3.4 10/19/2013    Medications: I have reviewed the patient's current medications.  Assessment/Plan: 1. Non-Hodgkin lymphoma treated with fludarabine/rituximab, last in July of 2009.  a. Restaging CT 11/24/2010 revealed evidence of progressive lymphoma in the abdomen and pelvis.  b. Initiation of salvage therapy with bendamustine/rituximab 12/04/2010. She completed 3 cycles.  c. Restaging CT 03/03/2011 revealed stable retroperitoneal soft tissue and a marked decrease in the soft tissue thickening associated with dilated loops of small bowel. 2. History of ITP. Intermittent mild thrombocytopenia persists. 3. History of Herpes zoster-maintained on prophylactic acyclovir. 4. Anemia secondary to non-Hodgkin lymphoma, chemotherapy, and a history of autoimmune hemolysis. The hemoglobin is  lower today. 5. Hypertension. 6. Right-hand tremor/ataxia-followed by neurology, now taking Sinemet, diagnosed with Parkinson's disease 7. Hearing loss-status post placement of an implanted hearing device by Dr. Cresenciano Lick. 8. Bilateral breast cancer, right breast cancer-grade 1, T1 N0, ER/PR positive, and HER2 negative. Left breast cancer, synchronous grade 2, T2 N0, ER/PR positive, and HER2 negative. She began adjuvant Arimidex on 09/15/2010. 9. Inflammatory changes of both lungs on a CT of the chest 11/24/2010-progressive on the restaging CT 03/03/2011, status post a bronchoscopy 03/17/2011 with no evidence of granulomata or tumor. 10. 11 mm spiculated lesion in the lingula on a CT 11/24/2010-less distinct on the CT 03/03/2011. 11. Upper respiratory infection while visiting her son in [place], June 2013, resolved after treatment with Levaquin/steroids 12. Upper respiratory infection,? Pneumonia summer 2014-chest x-ray 02/17/2013 with bilateral infiltrates  Disposition:  She remains in clinical remission from non-Hodgkin's lipoma. She is more anemic today. This could be related to the recent respiratory infection. She has a history of hemolytic anemia in the past. Ms. Parlow will return for an office visit and CBC in 2 months.  She plans to see Dr. Thornell Mule next week for evaluation of the "dizziness ". I doubt the dizziness is related to her history of lymphoma or breast cancer.  Betsy Coder, MD  10/19/2013  1:55 PM

## 2013-10-19 NOTE — Telephone Encounter (Signed)
gv adn printed aptp sched and avs for pt for June °

## 2013-10-23 DIAGNOSIS — H95199 Other disorders following mastoidectomy, unspecified ear: Secondary | ICD-10-CM | POA: Diagnosis not present

## 2013-10-23 DIAGNOSIS — R42 Dizziness and giddiness: Secondary | ICD-10-CM | POA: Diagnosis not present

## 2013-10-23 DIAGNOSIS — H905 Unspecified sensorineural hearing loss: Secondary | ICD-10-CM | POA: Diagnosis not present

## 2013-10-23 DIAGNOSIS — H908 Mixed conductive and sensorineural hearing loss, unspecified: Secondary | ICD-10-CM | POA: Diagnosis not present

## 2013-11-08 ENCOUNTER — Telehealth: Payer: Self-pay | Admitting: Dietician

## 2013-11-08 DIAGNOSIS — R350 Frequency of micturition: Secondary | ICD-10-CM | POA: Diagnosis not present

## 2013-11-08 DIAGNOSIS — N3946 Mixed incontinence: Secondary | ICD-10-CM | POA: Diagnosis not present

## 2013-11-08 NOTE — Telephone Encounter (Signed)
error 

## 2013-11-09 DIAGNOSIS — L259 Unspecified contact dermatitis, unspecified cause: Secondary | ICD-10-CM | POA: Diagnosis not present

## 2013-11-09 DIAGNOSIS — L219 Seborrheic dermatitis, unspecified: Secondary | ICD-10-CM | POA: Diagnosis not present

## 2013-12-05 DIAGNOSIS — H26499 Other secondary cataract, unspecified eye: Secondary | ICD-10-CM | POA: Diagnosis not present

## 2013-12-07 ENCOUNTER — Telehealth: Payer: Self-pay | Admitting: Neurology

## 2013-12-07 NOTE — Telephone Encounter (Signed)
Increase levodopa to four times per day from 3 times per day.

## 2013-12-07 NOTE — Telephone Encounter (Signed)
Patient states she was given entacapone for right foot pain that helped for awhile but the pain is back. Should she see a podiatrist or should we try something else? Please advise.

## 2013-12-07 NOTE — Telephone Encounter (Signed)
Pt called requesting to speak to a nurse regarding the slight pain in her right foot.

## 2013-12-08 DIAGNOSIS — N3946 Mixed incontinence: Secondary | ICD-10-CM | POA: Diagnosis not present

## 2013-12-08 NOTE — Telephone Encounter (Signed)
Patient made aware to increase levodopa and call and let us know if this does not help.

## 2013-12-08 NOTE — Telephone Encounter (Signed)
Left message on machine for patient to call back.

## 2013-12-08 NOTE — Telephone Encounter (Signed)
[place] PHONE CALL

## 2013-12-11 DIAGNOSIS — L219 Seborrheic dermatitis, unspecified: Secondary | ICD-10-CM | POA: Diagnosis not present

## 2013-12-11 DIAGNOSIS — L821 Other seborrheic keratosis: Secondary | ICD-10-CM | POA: Diagnosis not present

## 2013-12-16 ENCOUNTER — Other Ambulatory Visit: Payer: Self-pay | Admitting: Oncology

## 2013-12-16 DIAGNOSIS — C8589 Other specified types of non-Hodgkin lymphoma, extranodal and solid organ sites: Secondary | ICD-10-CM

## 2013-12-26 ENCOUNTER — Other Ambulatory Visit (HOSPITAL_BASED_OUTPATIENT_CLINIC_OR_DEPARTMENT_OTHER): Payer: Medicare Other

## 2013-12-26 ENCOUNTER — Ambulatory Visit (HOSPITAL_BASED_OUTPATIENT_CLINIC_OR_DEPARTMENT_OTHER): Payer: Medicare Other | Admitting: Oncology

## 2013-12-26 ENCOUNTER — Telehealth: Payer: Self-pay | Admitting: Oncology

## 2013-12-26 VITALS — BP 118/64 | HR 83 | Temp 97.6°F | Resp 18 | Ht 63.0 in | Wt 125.4 lb

## 2013-12-26 DIAGNOSIS — C50919 Malignant neoplasm of unspecified site of unspecified female breast: Secondary | ICD-10-CM | POA: Diagnosis not present

## 2013-12-26 DIAGNOSIS — C50019 Malignant neoplasm of nipple and areola, unspecified female breast: Secondary | ICD-10-CM | POA: Diagnosis not present

## 2013-12-26 DIAGNOSIS — C8588 Other specified types of non-Hodgkin lymphoma, lymph nodes of multiple sites: Secondary | ICD-10-CM

## 2013-12-26 DIAGNOSIS — C8589 Other specified types of non-Hodgkin lymphoma, extranodal and solid organ sites: Secondary | ICD-10-CM

## 2013-12-26 DIAGNOSIS — D630 Anemia in neoplastic disease: Secondary | ICD-10-CM

## 2013-12-26 DIAGNOSIS — T451X5A Adverse effect of antineoplastic and immunosuppressive drugs, initial encounter: Secondary | ICD-10-CM | POA: Diagnosis not present

## 2013-12-26 DIAGNOSIS — I10 Essential (primary) hypertension: Secondary | ICD-10-CM

## 2013-12-26 DIAGNOSIS — G20 Parkinson's disease: Secondary | ICD-10-CM

## 2013-12-26 DIAGNOSIS — D6481 Anemia due to antineoplastic chemotherapy: Secondary | ICD-10-CM | POA: Diagnosis not present

## 2013-12-26 LAB — CBC WITH DIFFERENTIAL/PLATELET
BASO%: 0.7 % (ref 0.0–2.0)
BASOS ABS: 0 10*3/uL (ref 0.0–0.1)
EOS%: 2.5 % (ref 0.0–7.0)
Eosinophils Absolute: 0.1 10*3/uL (ref 0.0–0.5)
HCT: 31.2 % — ABNORMAL LOW (ref 34.8–46.6)
HGB: 10.7 g/dL — ABNORMAL LOW (ref 11.6–15.9)
LYMPH#: 0.7 10*3/uL — AB (ref 0.9–3.3)
LYMPH%: 15.8 % (ref 14.0–49.7)
MCH: 30.2 pg (ref 25.1–34.0)
MCHC: 34.3 g/dL (ref 31.5–36.0)
MCV: 88.1 fL (ref 79.5–101.0)
MONO#: 0.3 10*3/uL (ref 0.1–0.9)
MONO%: 6.8 % (ref 0.0–14.0)
NEUT#: 3.3 10*3/uL (ref 1.5–6.5)
NEUT%: 74.2 % (ref 38.4–76.8)
PLATELETS: 129 10*3/uL — AB (ref 145–400)
RBC: 3.54 10*6/uL — ABNORMAL LOW (ref 3.70–5.45)
RDW: 13.1 % (ref 11.2–14.5)
WBC: 4.4 10*3/uL (ref 3.9–10.3)
nRBC: 0 % (ref 0–0)

## 2013-12-26 LAB — COMPREHENSIVE METABOLIC PANEL (CC13)
ALBUMIN: 3.7 g/dL (ref 3.5–5.0)
ALT: 8 U/L (ref 0–55)
ANION GAP: 14 meq/L — AB (ref 3–11)
AST: 18 U/L (ref 5–34)
Alkaline Phosphatase: 81 U/L (ref 40–150)
BUN: 18.8 mg/dL (ref 7.0–26.0)
CALCIUM: 9.5 mg/dL (ref 8.4–10.4)
CHLORIDE: 105 meq/L (ref 98–109)
CO2: 25 mEq/L (ref 22–29)
CREATININE: 1.2 mg/dL — AB (ref 0.6–1.1)
GLUCOSE: 97 mg/dL (ref 70–140)
POTASSIUM: 4 meq/L (ref 3.5–5.1)
Sodium: 144 mEq/L (ref 136–145)
Total Bilirubin: 0.36 mg/dL (ref 0.20–1.20)
Total Protein: 5.9 g/dL — ABNORMAL LOW (ref 6.4–8.3)

## 2013-12-26 NOTE — Progress Notes (Signed)
  [place] OFFICE PROGRESS NOTE   Diagnosis: Breast cancer, non-Hodgkin's lymphoma  INTERVAL HISTORY:   She returns as scheduled. She continues Arimidex. No hot flashes or arthralgias. The "dizziness "improved with an adjustment in her medications. She continues to have episodes of dizziness with changes in the temperature.  Objective:  Vital signs in last 24 hours:  Blood pressure 118/64, pulse 83, temperature 97.6 F (36.4 C), temperature source Oral, resp. rate 18, height _0  (1.6 m), weight 125 lb 6.4 oz (56.881 kg), SpO2 99.00%.    HEENT: Neck without mass Lymphatics: No cervical, supraclavicular, axillary, or inguinal nodes Resp: Lungs clear bilaterally Cardio: Regular rate and rhythm GI: No hepatosplenomegaly, nontender, no mass Vascular: No leg edema   Lab Results:  Lab Results  Component Value Date   WBC 4.4 12/26/2013   HGB 10.7* 12/26/2013   HCT 31.2* 12/26/2013   MCV 88.1 12/26/2013   PLT 129* 12/26/2013   NEUTROABS 3.3 12/26/2013    Medications: I have reviewed the patient's current medications.  Assessment/Plan: 1. Non-Hodgkin lymphoma treated with fludarabine/rituximab, last in July of 2009.  a. Restaging CT 11/24/2010 revealed evidence of progressive lymphoma in the abdomen and pelvis.  b. Initiation of salvage therapy with bendamustine/rituximab 12/04/2010. She completed 3 cycles.  c. Restaging CT 03/03/2011 revealed stable retroperitoneal soft tissue and a marked decrease in the soft tissue thickening associated with dilated loops of small bowel. 2. History of ITP. Intermittent mild thrombocytopenia persists. 3. History of Herpes zoster-maintained on prophylactic acyclovir. 4. Anemia secondary to non-Hodgkin lymphoma, chemotherapy, and a history of autoimmune hemolysis.  5. Hypertension. 6. Right-hand tremor/ataxia-followed by neurology, now taking Sinemet, diagnosed with Parkinson's disease 7. Hearing loss-status post placement of an  implanted hearing device by Dr. Cresenciano Lick. 8. Bilateral breast cancer, right breast cancer-grade 1, T1 N0, ER/PR positive, and HER2 negative. Left breast cancer, synchronous grade 2, T2 N0, ER/PR positive, and HER2 negative. She began adjuvant Arimidex on 09/15/2010. 9. Inflammatory changes of both lungs on a CT of the chest 11/24/2010-progressive on the restaging CT 03/03/2011, status post a bronchoscopy 03/17/2011 with no evidence of granulomata or tumor. 10. 11 mm spiculated lesion in the lingula on a CT 11/24/2010-less distinct on the CT 03/03/2011. 11. Upper respiratory infection while visiting her son in [place], June 2013, resolved after treatment with Levaquin/steroids 12. Upper respiratory infection,? Pneumonia summer 2014-chest x-ray 02/17/2013 with bilateral infiltrates     Disposition: She remains in clinical remission from non-Hodgkin's lymphoma and breast cancer. She will continue Arimidex. [name] will return for an office visit in September 2015.  [name], MD  12/26/2013  11:06 AM

## 2013-12-26 NOTE — Telephone Encounter (Signed)
, °

## 2014-01-04 DIAGNOSIS — F3289 Other specified depressive episodes: Secondary | ICD-10-CM | POA: Diagnosis not present

## 2014-01-04 DIAGNOSIS — F411 Generalized anxiety disorder: Secondary | ICD-10-CM | POA: Diagnosis not present

## 2014-01-04 DIAGNOSIS — N318 Other neuromuscular dysfunction of bladder: Secondary | ICD-10-CM | POA: Diagnosis not present

## 2014-01-04 DIAGNOSIS — F329 Major depressive disorder, single episode, unspecified: Secondary | ICD-10-CM | POA: Diagnosis not present

## 2014-01-04 DIAGNOSIS — E785 Hyperlipidemia, unspecified: Secondary | ICD-10-CM | POA: Diagnosis not present

## 2014-01-04 DIAGNOSIS — I10 Essential (primary) hypertension: Secondary | ICD-10-CM | POA: Diagnosis not present

## 2014-01-15 DIAGNOSIS — J40 Bronchitis, not specified as acute or chronic: Secondary | ICD-10-CM | POA: Diagnosis not present

## 2014-01-16 ENCOUNTER — Ambulatory Visit: Payer: Medicare Other | Admitting: Physical Therapy

## 2014-01-16 ENCOUNTER — Ambulatory Visit: Payer: Medicare Other | Admitting: Speech Pathology

## 2014-01-16 ENCOUNTER — Ambulatory Visit: Payer: Medicare Other | Attending: Neurology | Admitting: Occupational Therapy

## 2014-01-16 DIAGNOSIS — Z5189 Encounter for other specified aftercare: Secondary | ICD-10-CM | POA: Insufficient documentation

## 2014-01-16 DIAGNOSIS — R269 Unspecified abnormalities of gait and mobility: Secondary | ICD-10-CM | POA: Insufficient documentation

## 2014-01-16 DIAGNOSIS — M629 Disorder of muscle, unspecified: Secondary | ICD-10-CM | POA: Insufficient documentation

## 2014-01-16 DIAGNOSIS — M2420 Disorder of ligament, unspecified site: Secondary | ICD-10-CM | POA: Insufficient documentation

## 2014-01-16 DIAGNOSIS — R471 Dysarthria and anarthria: Secondary | ICD-10-CM | POA: Insufficient documentation

## 2014-01-16 DIAGNOSIS — R279 Unspecified lack of coordination: Secondary | ICD-10-CM | POA: Insufficient documentation

## 2014-01-20 ENCOUNTER — Other Ambulatory Visit: Payer: Self-pay | Admitting: Oncology

## 2014-01-20 DIAGNOSIS — C8589 Other specified types of non-Hodgkin lymphoma, extranodal and solid organ sites: Secondary | ICD-10-CM

## 2014-02-02 ENCOUNTER — Encounter: Payer: Self-pay | Admitting: Neurology

## 2014-02-02 ENCOUNTER — Ambulatory Visit (INDEPENDENT_AMBULATORY_CARE_PROVIDER_SITE_OTHER): Payer: Medicare Other | Admitting: Neurology

## 2014-02-02 VITALS — BP 122/66 | HR 88 | Resp 14 | Ht 63.0 in | Wt 123.0 lb

## 2014-02-02 DIAGNOSIS — G20 Parkinson's disease: Secondary | ICD-10-CM

## 2014-02-02 NOTE — Progress Notes (Signed)
[name] was seen today in the movement disorders clinic for neurologic consultation at the request of Dr. Betsy Coder.    The consultation is for the evaluation of ataxia and tremor.  The patient has been a previous pt of Dr. Erling Cruz.  Dr. Erling Cruz dx the patient with parkinsonism and ET.  This is an 80 y/o female with a complex medical hx of both non Hodgkins lymphoma require chemotherapy and breast CA, s/p bilateral mastectomy.  She has a long hx of tremor.  I reviewed Dr. Bernardo Heater notes that were made available to me.  It appears that she presented with right hand resting tremor in 2009, without associated rigidity or bradykinesia.  There were no known medications causing the problem and it appears that she was initially dx with ET and later transitioned the dx to parkinsonism.  The patient reports no tremor in the L hand.  Her husband reports a little tremor in the R leg.  The patient reports that tremor is most significant with relaxing and she has no tremor if her hands are preoccupied.    She has never been on PD medication or been to PT until yesterday.  03/16/13 update:  The pt presents today with her husband.  She has not been seen since February, 2014 at which time I dx her with PD.  She was started on levodopa 25/100 three times per day.  She did not follow up sooner as she had bronchitis and pneumonia for the last 2 months and is just now recovering.  She did go to PT and liked it but would like to go back.  She is doing well on the levodopa.  Her husband rarely notice tremor any longer.  06/28/13 update:    The pt today presents for f/u with her husband who supplements the hx.  She is on carbidopa/levodopa 25/100 three times per day.  She is doing well but notices R arm tremor now that had gone away.  Rarely she will note tremor in the L hand which she had not noted previously.  She does have some cramping of the feet.  She is unsure if this is associated with wearing off.  She did have some  of this while in my office, and it was time for her to redose the carbidopa/levodopa.  She is now attending therapy, and is enjoying and finding benefit from it.  08/08/12 update:  The patient presents today with her husband, who supplements the history.  The patient has Parkinson's disease and is currently on carbidopa/levodopa 25/100, one tablet 3 times per day.  Last visit, she was describing some cramping in her feet and I was trying to figure out whether or not this was an "off" phenomenon.  After paying attention, the patient called and said that she thought it was and we added entacapone.  The cramping in the feet is better but she is now having a stabbing and shooting pain down the back of the leg and to the lateral side of the lower leg that stops at the ankle.  It has stopped her from exercising.  She sometimes has back pain.  This is fairly inconsistent.  The leg pain seems to start about 4 days after she finished rehabilitation, which did seem to help.  She is not sure if the pain is related to the rehabilitation, however.  10/03/13 update:  The patient presents today with her husband, who supplements the history.  The patient is currently on carbidopa/levodopa 25/100, one  tablet 3 times per day.  She is also on Comtan 200 mg, one tablet 3 times per day.  From a Parkinson's standpoint, she has been doing well.  No falls.  No hallucinations.  Her husband rarely sees tremor, and that is primarily when she is in bed at night and he will feel it rather than see it.  Last visit, she was complaining of back pain that was likely sciatica.  I ordered an EMG but they ultimately canceled that.  I tried her on Lyrica but that caused her to be excessively sleepy.  She went to the chiropractor and states that she is doing much better from that regard.  Unfortunately, she has had bronchitis and a urinary tract infection.  She has been on several antibiotics, which have caused her to be nauseated.  She has also had  dental work and had several of her teeth pulled.  Because of these things, she has had a decreased appetite and really has not been able to eat.  She had a temporary partial placed, but her appetite still hasn't been what it used to be, primarily because of the nausea from the antibiotic.  She has not been exercising because of being sick.  She is hoping to get back to that soon.  02/02/14 update:  The patient presents today with her husband, who supplements the history.  The patient is currently on carbidopa/levodopa 25/100, 1 tablet at 9 AM/1 PM and 2 tablets at 6 PM.   She is also on Comtan 200 mg, one tablet 3 times per day.   The discomfort in the foot is much better.  From a Parkinson's standpoint, she has been doing well.  No falls.  No hallucinations.  The patient was screened through our Parkinson's screening program on 01/16/2014 and was either at baseline or better than baseline at all therapies and is scheduled for re-screening in January.  She is not currently exercising.    Wearing off:  No How long before next dose:  Not applicable Falls:   no N/V:  no Hallucinations:  no  visual distortions: no Lightheaded:  yes rarely  Syncope: no Dyskinesia:  no   Neuroimaging has  previously been performed.  An MRI brain was last done in 2010 but pt can no longer have MRI's because of an implantable hearing device.    CT HEAD WITHOUT CONTRAST CT MAXILLOFACIAL WITHOUT CONTRAST   Technique:  Multidetector CT imaging of the head and maxillofacial structures were performed using the standard protocol without intravenous contrast. Multiplanar CT image reconstructions of the maxillofacial structures were also generated.   Comparison:  MRI brain dated 12/04/2008   CT HEAD   Findings: No evidence of parenchymal hemorrhage or extra-axial fluid collection. No mass lesion, mass effect, or midline shift.   No CT evidence of acute infarction.   Subcortical white matter and periventricular small  vessel ischemic changes.  Intracranial atherosclerosis.   Global cortical atrophy, possibly age appropriate.  No ventriculomegaly.   The visualized paranasal sinuses are essentially clear.  The left mastoid air cells are unopacified.  Prior right mastoidectomy.   Metallic device in the right occipital scalp (series 8/image 21). No evidence of calvarial fracture.   IMPRESSION: No evidence of acute intracranial abnormality.   Atrophy with small vessel ischemic changes and intracranial atherosclerosis.   CT MAXILLOFACIAL   Findings:   No evidence of maxillofacial fracture.   The visualized paranasal sinuses are essentially clear.  The left mastoid air cells are  unopacified.  Prior right mastoidectomy.   The bilateral orbits, including the retroconal soft tissues, are within normal limits.   The visualized cervical spine is intact to the bottom of C7, noting mild multilevel degenerative changes.   IMPRESSION: No evidence of maxillofacial fracture. A CT brain was recently performed:    Clinical Data: Alzheimer's. Right hand tremors. History of  lymphoma.  MRI HEAD WITHOUT AND WITH CONTRAST  Technique: Multiplanar, multiecho pulse sequences of the brain and  surrounding structures were obtained according to standard protocol  without and with intravenous contrast  Contrast: 7 ml Multihance IV.  Comparison: CT 03/24/2004.  Findings: There is age appropriate atrophy. This is generalized  atrophy which is not lobar specific. There is moderate patchy  hyperintensity throughout the cerebral white matter bilaterally.  This is compatible with chronic microvascular ischemia. There is  a small cyst in the right basal ganglia. The brainstem and  cerebellum appear normal. Diffusion weighted imaging is negative  for acute infarct. Negative for hemorrhage or mass lesion.  Following contrast infusion there is a small venous angioma in the  right lateral frontal lobe. No evidence of  edema or hemorrhage is  identified. No enhancing mass lesion is identified. There is  chronic sinusitis with mucosal thickening in the paranasal sinuses.  There also is an air-fluid level in the left frontal sinus.  IMPRESSION:  Age appropriate atrophy with chronic microvascular ischemia. No  acute infarct.  Small venous angioma in the right frontal lobe.  Acute and chronic sinusitis.    PREVIOUS MEDICATIONS: none to date  ALLERGIES:  No Known Allergies  CURRENT MEDICATIONS:  Current Outpatient Prescriptions on File Prior to Visit  Medication Sig Dispense Refill  . acyclovir (ZOVIRAX) 400 MG tablet TAKE 1 TABLET BY MOUTH 2 TIMES DAILY  60 tablet  3  . amLODipine (NORVASC) 5 MG tablet Take 5 mg by mouth daily.      [name] anastrozole (ARIMIDEX) 1 MG tablet TAKE 1 TABLET (1 MG TOTAL) BY MOUTH DAILY.  90 tablet  3  . Calcium Carbonate-Vitamin D (CALCIUM 600 + D PO) Take 2 capsules by mouth daily.       . carbidopa-levodopa (SINEMET IR) 25-100 MG per tablet Take 1 tablet by mouth 4 (four) times daily.      . cetirizine (ZYRTEC) 10 MG tablet Take 10 mg by mouth daily.      . Cholecalciferol 1000 UNITS capsule Take 2,000 Units by mouth daily.      . Clobetasol Prop Crea-Coal Tar 0.05 & 2.3 % KIT Apply topically as needed. For rash on scalp      . entacapone (COMTAN) 200 MG tablet Take 1 tablet (200 mg total) by mouth 3 (three) times daily.  270 tablet  0  . Fluticasone-Salmeterol (ADVAIR DISKUS) 100-50 MCG/DOSE AEPB Inhale 1 puff into the lungs every 12 (twelve) hours.       [name] losartan-hydrochlorothiazide (HYZAAR) 100-12.5 MG per tablet Take 0.5 tablets by mouth daily.       . mirabegron ER (MYRBETRIQ) 50 MG TB24 tablet Take 50 mg by mouth daily.      . Multiple Vitamins-Minerals (MULTIVITAMIN WITH MINERALS) tablet Take 1 tablet by mouth daily.         No current facility-administered medications on file prior to visit.    PAST MEDICAL HISTORY:   Past Medical History  Diagnosis Date  . NHL  (non-Hodgkin's lymphoma)     nhl dx 9/04 breast ca dx1/12  . Asthma   .  Hearing loss   . Diabetes mellitus without complication     04/19/45.Marland KitchenMarland Kitchenpt denies  . GERD (gastroesophageal reflux disease)     PAST SURGICAL HISTORY:   Past Surgical History  Procedure Laterality Date  . Mastectomy  2 /8/ 12    bilateral  . Ba-ha ear implant      SOCIAL HISTORY:   History   Social History  . Marital Status: Married    Spouse Name: N/A    Number of Children: N/A  . Years of Education: N/A   Occupational History  . retired     Pharmacist, hospital   Social History Main Topics  . Smoking status: Never Smoker   . Smokeless tobacco: Never Used     Comment: husband wsa smoker  . Alcohol Use: No     Comment: no  . Drug Use: No  . Sexual Activity: Not on file   Other Topics Concern  . Not on file   Social History Narrative  . No narrative on file    FAMILY HISTORY:   Family Status  Relation Status Death Age  . Father Deceased     CA, lung  . Mother Deceased     CA; complications of splenectomy  . Brother Deceased     3, renal failure, DM-2; CAD; CA  . Child Alive     5 (4 biological), one with PKU    ROS:  A complete 10 system review of systems was obtained and was unremarkable apart from what is mentioned above.  PHYSICAL EXAMINATION:    VITALS:   Filed Vitals:   02/02/14 1106  BP: 122/66  Pulse: 88  Resp: 14  Height: 5' 3" (1.6 m)  Weight: 123 lb (55.792 kg)    GEN:  The patient appears stated age and is in NAD.   Neurological examination:  Orientation: The patient is alert and oriented x3.  Attention and concentration are normal.    Able to name objects and repeat phrases.  A complete Mini-Mental status examination was performed in August, 2014 and the patient scored a 26/30. Cranial nerves: There is good facial symmetry.  The visual fields are full to confrontational testing. The speech is fluent and clear. Soft palate rises symmetrically and there is no tongue  deviation. Hearing is intact to conversational tone. Sensation: Sensation is intact to light outh throughout. Motor: Strength is 5/5 in the bilateral upper and lower extremities.   Shoulder shrug is equal and symmetric.  There is no pronator drift. Deep tendon reflexes: Deep tendon reflexes are 3/4 at the bilateral biceps, triceps, brachioradialis, patella and trace at the bilateralachilles. Plantar responses are downgoing bilaterally.  Movement examination: Tone: There is normal tone in the upper and lower extremities today. Abnormal movements: there is  no tremor on the right.   The left was normal. Coordination:  There is  virtually no decremation seen with rapid alternating movements today. Gait and Station: The patient no has trouble arising out of the chair without use of her hands today.  Stride length and arm swing are good. Labs:  Lab Results  Component Value Date   WBC 4.4 12/26/2013   HGB 10.7* 12/26/2013   HCT 31.2* 12/26/2013   MCV 88.1 12/26/2013   PLT 129* 12/26/2013     Chemistry      Component Value Date/Time   NA 144 12/26/2013 1021   NA 139 09/20/2012 0058   K 4.0 12/26/2013 1021   K 4.2 09/20/2012 0058   CL  106 12/26/2012 1110   CL 106 09/20/2012 0058   CO2 25 12/26/2013 1021   CO2 31 11/27/2011 1526   BUN 18.8 12/26/2013 1021   BUN 24* 09/20/2012 0058   CREATININE 1.2* 12/26/2013 1021   CREATININE 1.10 09/20/2012 0058      Component Value Date/Time   CALCIUM 9.5 12/26/2013 1021   CALCIUM 9.0 11/27/2011 1526   CALCIUM 11.1* 07/15/2006 1212   ALKPHOS 81 12/26/2013 1021   ALKPHOS 89 11/27/2011 1526   AST 18 12/26/2013 1021   AST 19 11/27/2011 1526   ALT 8 12/26/2013 1021   ALT 16 11/27/2011 1526   BILITOT 0.36 12/26/2013 1021   BILITOT 0.2* 11/27/2011 1526      Lab Results  Component Value Date   VITAMINB12 1053* 06/10/2006   Lab Results  Component Value Date   TSH 2.93 09/19/2012   Reviewed labs that pt brought from 02/17/13.  AST 58, ALT was increased at 106 but PCP felt d/t pneumonia and  meds and pt was to redo labs.  ASSESSMENT/PLAN:  1.  Idiopathic Parkinson's disease, dx 08/2012.  This is evidenced by bradykinesia, tremor, postural instability and rigidity.  There are no atypical features.  -We discussed the diagnosis as well as pathophysiology of the disease.  We discussed treatment options as well as prognostic indicators.  Patient education was provided.  -I would like to continue the patient on levodopa therapy and entacapone.  She is improved on low dose levodopa therapy.  she is currently on carbidopa/levodopa 25/100 one tablet at 9 AM/one tablet at 1 PM and 2 tablets at 6 PM and will continue this.  Risks, benefits, side effects and alternative therapies were discussed.  The opportunity to ask questions was given and they were answered to the best of my ability.  The patient expressed understanding and willingness to follow the outlined treatment protocols.  -Encouraged restart exercise.  Pt education provided. 2.  R L5 radiculopathy vs. Sciatica.  -Resolved after chiropractic treatment 3. Return in about 4 months (around 06/05/2014).

## 2014-02-19 DIAGNOSIS — R350 Frequency of micturition: Secondary | ICD-10-CM | POA: Diagnosis not present

## 2014-03-19 ENCOUNTER — Other Ambulatory Visit: Payer: Self-pay | Admitting: Neurology

## 2014-03-19 DIAGNOSIS — C8589 Other specified types of non-Hodgkin lymphoma, extranodal and solid organ sites: Secondary | ICD-10-CM

## 2014-03-19 MED ORDER — CARBIDOPA-LEVODOPA 25-100 MG PO TABS: ORAL_TABLET | ORAL | Status: DC

## 2014-03-19 NOTE — Telephone Encounter (Signed)
Carbidopa Levodopa refill requested. Per last office note- patient to remain on medication. Refill approved and sent to patient's pharmacy.   

## 2014-04-17 ENCOUNTER — Ambulatory Visit (HOSPITAL_BASED_OUTPATIENT_CLINIC_OR_DEPARTMENT_OTHER): Payer: Medicare Other | Admitting: Oncology

## 2014-04-17 ENCOUNTER — Telehealth: Payer: Self-pay | Admitting: Oncology

## 2014-04-17 ENCOUNTER — Other Ambulatory Visit (HOSPITAL_BASED_OUTPATIENT_CLINIC_OR_DEPARTMENT_OTHER): Payer: Medicare Other

## 2014-04-17 VITALS — BP 118/53 | HR 76 | Temp 97.7°F | Resp 20 | Ht 63.0 in | Wt 120.8 lb

## 2014-04-17 DIAGNOSIS — D6481 Anemia due to antineoplastic chemotherapy: Secondary | ICD-10-CM

## 2014-04-17 DIAGNOSIS — C50919 Malignant neoplasm of unspecified site of unspecified female breast: Secondary | ICD-10-CM

## 2014-04-17 DIAGNOSIS — T451X5A Adverse effect of antineoplastic and immunosuppressive drugs, initial encounter: Secondary | ICD-10-CM

## 2014-04-17 DIAGNOSIS — I10 Essential (primary) hypertension: Secondary | ICD-10-CM

## 2014-04-17 DIAGNOSIS — C8588 Other specified types of non-Hodgkin lymphoma, lymph nodes of multiple sites: Secondary | ICD-10-CM

## 2014-04-17 DIAGNOSIS — G20 Parkinson's disease: Secondary | ICD-10-CM

## 2014-04-17 DIAGNOSIS — C50419 Malignant neoplasm of upper-outer quadrant of unspecified female breast: Secondary | ICD-10-CM | POA: Diagnosis not present

## 2014-04-17 DIAGNOSIS — D630 Anemia in neoplastic disease: Secondary | ICD-10-CM | POA: Diagnosis not present

## 2014-04-17 DIAGNOSIS — C8589 Other specified types of non-Hodgkin lymphoma, extranodal and solid organ sites: Secondary | ICD-10-CM

## 2014-04-17 DIAGNOSIS — G20A1 Parkinson's disease without dyskinesia, without mention of fluctuations: Secondary | ICD-10-CM

## 2014-04-17 DIAGNOSIS — R634 Abnormal weight loss: Secondary | ICD-10-CM | POA: Diagnosis not present

## 2014-04-17 LAB — COMPREHENSIVE METABOLIC PANEL (CC13)
ALT: <6 U/L (ref 0–55)
ANION GAP: 8 meq/L (ref 3–11)
AST: 20 U/L (ref 5–34)
Albumin: 3.8 g/dL (ref 3.5–5.0)
Alkaline Phosphatase: 87 U/L (ref 40–150)
BILIRUBIN TOTAL: 0.51 mg/dL (ref 0.20–1.20)
BUN: 16 mg/dL (ref 7.0–26.0)
CHLORIDE: 107 meq/L (ref 98–109)
CO2: 30 meq/L — AB (ref 22–29)
CREATININE: 1.2 mg/dL — AB (ref 0.6–1.1)
Calcium: 9.3 mg/dL (ref 8.4–10.4)
Glucose: 126 mg/dl (ref 70–140)
Potassium: 3.8 mEq/L (ref 3.5–5.1)
Sodium: 145 mEq/L (ref 136–145)
Total Protein: 6.3 g/dL — ABNORMAL LOW (ref 6.4–8.3)

## 2014-04-17 LAB — CBC WITH DIFFERENTIAL/PLATELET
BASO%: 1 % (ref 0.0–2.0)
Basophils Absolute: 0 10*3/uL (ref 0.0–0.1)
EOS ABS: 0.1 10*3/uL (ref 0.0–0.5)
EOS%: 1.7 % (ref 0.0–7.0)
HEMATOCRIT: 32 % — AB (ref 34.8–46.6)
HGB: 11 g/dL — ABNORMAL LOW (ref 11.6–15.9)
LYMPH%: 22.1 % (ref 14.0–49.7)
MCH: 30.2 pg (ref 25.1–34.0)
MCHC: 34.4 g/dL (ref 31.5–36.0)
MCV: 87.9 fL (ref 79.5–101.0)
MONO#: 0.3 10*3/uL (ref 0.1–0.9)
MONO%: 7.7 % (ref 0.0–14.0)
NEUT%: 67.5 % (ref 38.4–76.8)
NEUTROS ABS: 2.7 10*3/uL (ref 1.5–6.5)
PLATELETS: 142 10*3/uL — AB (ref 145–400)
RBC: 3.64 10*6/uL — ABNORMAL LOW (ref 3.70–5.45)
RDW: 12.7 % (ref 11.2–14.5)
WBC: 4 10*3/uL (ref 3.9–10.3)
lymph#: 0.9 10*3/uL (ref 0.9–3.3)
nRBC: 0 % (ref 0–0)

## 2014-04-17 NOTE — Progress Notes (Signed)
  [facility] OFFICE PROGRESS NOTE   Diagnosis: Non-Hodgkin's lymphoma, breast cancer  INTERVAL HISTORY:   She returns as scheduled. She continues Arimidex. She is concerned about recent weight loss. She had a single episode of vomiting after eating "chili ". No difficulty with bowel or bladder function. No, pain.  She also reports "dizziness "when going from a hot to a cold environment. She has decreased the dose of her blood pressure medications.  Objective:  Vital signs in last 24 hours:  Blood pressure 118/53, pulse 76, temperature 97.7 F (36.5 C), temperature source Oral, resp. rate 20, height $RemoveBe'5\' 3"'[id number]$  (1.6 m), weight 120 lb 12.8 oz (54.795 kg).    HEENT: Neck without mass Lymphatics: No cervical, supraclavicular, axillary, or inguinal nodes Resp: Lungs clear bilaterally Cardio: Regular rate and rhythm, 2 or 6 systolic murmur GI: No hepatosplenomegaly, nontender, no mass Vascular: No leg edema Breast: Status post bilateral mastectomy. No evidence for chest wall tumor recurrence    Lab Results:  Lab Results  Component Value Date   WBC 4.0 04/17/2014   HGB 11.0* 04/17/2014   HCT 32.0* 04/17/2014   MCV 87.9 04/17/2014   PLT 142* 04/17/2014   NEUTROABS 2.7 04/17/2014     Imaging:  No results found.  Medications: I have reviewed the patient's current medications.  Assessment/Plan: 1. Non-Hodgkin lymphoma treated with fludarabine/rituximab, last in July of 2009.  a. Restaging CT 11/24/2010 revealed evidence of progressive lymphoma in the abdomen and pelvis.  b. Initiation of salvage therapy with bendamustine/rituximab 12/04/2010. She completed 3 cycles.  c. Restaging CT 03/03/2011 revealed stable retroperitoneal soft tissue and a marked decrease in the soft tissue thickening associated with dilated loops of small bowel. 2. History of ITP. Intermittent mild thrombocytopenia persists. 3. History of Herpes zoster-maintained on prophylactic acyclovir. 4. Anemia  secondary to non-Hodgkin lymphoma, chemotherapy, and a history of autoimmune hemolysis.  5. Hypertension. 6. Right-hand tremor/ataxia-followed by neurology, now taking Sinemet, diagnosed with Parkinson's disease 7. Hearing loss-status post placement of an implanted hearing device by Dr. Cresenciano Lick. 8. Bilateral breast cancer, right breast cancer-grade 1, T1 N0, ER/PR positive, and HER2 negative. Left breast cancer, synchronous grade 2, T2 N0, ER/PR positive, and HER2 negative. She began adjuvant Arimidex on 09/15/2010. 9. Inflammatory changes of both lungs on a CT of the chest 11/24/2010-progressive on the restaging CT 03/03/2011, status post a bronchoscopy 03/17/2011 with no evidence of granulomata or tumor. 10. 11 mm spiculated lesion in the lingula on a CT 11/24/2010-less distinct on the CT 03/03/2011. 11. Upper respiratory infection while visiting her son in [place], June 2013, resolved after treatment with Levaquin/steroids 12. Upper respiratory infection,? Pneumonia summer 2014-chest x-ray 02/17/2013 with bilateral infiltrates   Disposition:  She remains in clinical remission from lymphoma and breast cancer. She has lost approximately 5 pounds over the past 6 months. This could be related to Parkinson's disease or another problem. I have a low clinical suspicion for recurrence of breast cancer. She will contact us for persistent nausea or new abdominal symptoms and we will arrange for a restaging CT of the abdomen/pelvis.  Ms. Borgwardt will return for an influenza vaccine in one to 2 weeks. She will be scheduled for a 2 month office visit. I suggested she followup with Dr. Kenton Kingfisher and her neurologist to evaluate the "dizziness "and weight loss.  Betsy Coder, MD  04/17/2014  12:09 PM

## 2014-04-17 NOTE — Telephone Encounter (Signed)
, °

## 2014-04-23 ENCOUNTER — Telehealth: Payer: Self-pay | Admitting: Dietician

## 2014-04-23 NOTE — Telephone Encounter (Signed)
Brief Outpatient Oncology Nutrition Note  Patient has been identified to be at risk on malnutrition screen.  Wt Readings from Last 10 Encounters:  04/17/14 120 lb 12.8 oz (54.795 kg)  02/02/14 123 lb (55.792 kg)  12/26/13 125 lb 6.4 oz (56.881 kg)  10/19/13 126 lb 1.6 oz (57.199 kg)  10/03/13 126 lb (57.153 kg)  08/08/13 134 lb 11.2 oz (61.1 kg)  06/28/13 134 lb 8 oz (61.009 kg)  05/02/13 130 lb 1.6 oz (59.013 kg)  03/16/13 128 lb (58.06 kg)  12/26/12 131 lb 3.2 oz (59.512 kg)      Dx:  Hx of breast cancer and non-hodgkin's lymphoma in remission.  Called patient due to weight loss.  Patient was not available at this time.  Message left with contact information for the outpatient [facility] RD.  Antonieta Iba, RD, LDN

## 2014-05-01 ENCOUNTER — Telehealth: Payer: Self-pay | Admitting: Oncology

## 2014-05-01 NOTE — Telephone Encounter (Signed)
returned pt call and s.w pt and advised on appts.[name] KitchenMarland Mathews

## 2014-05-02 ENCOUNTER — Ambulatory Visit (HOSPITAL_BASED_OUTPATIENT_CLINIC_OR_DEPARTMENT_OTHER): Payer: Medicare Other

## 2014-05-02 VITALS — BP 128/63 | HR 80 | Temp 97.5°F

## 2014-05-02 DIAGNOSIS — H9041 Sensorineural hearing loss, unilateral, right ear, with unrestricted hearing on the contralateral side: Secondary | ICD-10-CM | POA: Diagnosis not present

## 2014-05-02 DIAGNOSIS — Z23 Encounter for immunization: Secondary | ICD-10-CM

## 2014-05-02 MED ORDER — INFLUENZA VAC SPLIT QUAD 0.5 ML IM SUSY
0.5000 mL | PREFILLED_SYRINGE | Freq: Once | INTRAMUSCULAR | Status: AC
  Administered 2014-05-02: 0.5 mL via INTRAMUSCULAR
  Filled 2014-05-02: qty 0.5

## 2014-05-04 DIAGNOSIS — Z01419 Encounter for gynecological examination (general) (routine) without abnormal findings: Secondary | ICD-10-CM | POA: Diagnosis not present

## 2014-05-04 DIAGNOSIS — Z0000 Encounter for general adult medical examination without abnormal findings: Secondary | ICD-10-CM | POA: Diagnosis not present

## 2014-05-04 DIAGNOSIS — Z124 Encounter for screening for malignant neoplasm of cervix: Secondary | ICD-10-CM | POA: Diagnosis not present

## 2014-05-09 ENCOUNTER — Telehealth: Payer: Self-pay | Admitting: Dietician

## 2014-05-09 NOTE — Telephone Encounter (Signed)
Brief Outpatient Oncology Nutrition Note  Returning patient's call.  Unable to reach patient at this time.  Patient is concerned about her weight loss.  Will continue to contact patient.  Antonieta Iba, RD, LDN

## 2014-05-14 ENCOUNTER — Telehealth: Payer: Self-pay | Admitting: Nutrition

## 2014-05-14 NOTE — Telephone Encounter (Signed)
Patient reports poor appetite and weight loss.  She has had occasional vomiting with nausea.  Patient is concerned and would like information on dietary changes she can make.  Educated patient on strategies for increasing appetite.  Recommended 6 small meals and snacks.  Recommended increase [employer] breakfast essentials 3 times a day between meals.  Will mail fact sheets, and coupons to patient along with contact information for further questions or concerns.  Patient is appreciative.

## 2014-05-17 DIAGNOSIS — H908 Mixed conductive and sensorineural hearing loss, unspecified: Secondary | ICD-10-CM | POA: Diagnosis not present

## 2014-05-17 DIAGNOSIS — H95191 Other disorders following mastoidectomy, right ear: Secondary | ICD-10-CM | POA: Diagnosis not present

## 2014-05-29 ENCOUNTER — Other Ambulatory Visit: Payer: Self-pay | Admitting: Oncology

## 2014-05-29 DIAGNOSIS — C8599 Non-Hodgkin lymphoma, unspecified, extranodal and solid organ sites: Secondary | ICD-10-CM

## 2014-06-01 DIAGNOSIS — F419 Anxiety disorder, unspecified: Secondary | ICD-10-CM | POA: Diagnosis not present

## 2014-06-01 DIAGNOSIS — K219 Gastro-esophageal reflux disease without esophagitis: Secondary | ICD-10-CM | POA: Diagnosis not present

## 2014-06-01 DIAGNOSIS — Z23 Encounter for immunization: Secondary | ICD-10-CM | POA: Diagnosis not present

## 2014-06-01 DIAGNOSIS — M859 Disorder of bone density and structure, unspecified: Secondary | ICD-10-CM | POA: Diagnosis not present

## 2014-06-01 DIAGNOSIS — I10 Essential (primary) hypertension: Secondary | ICD-10-CM | POA: Diagnosis not present

## 2014-06-01 DIAGNOSIS — Z853 Personal history of malignant neoplasm of breast: Secondary | ICD-10-CM | POA: Diagnosis not present

## 2014-06-01 DIAGNOSIS — E785 Hyperlipidemia, unspecified: Secondary | ICD-10-CM | POA: Diagnosis not present

## 2014-06-01 DIAGNOSIS — N3281 Overactive bladder: Secondary | ICD-10-CM | POA: Diagnosis not present

## 2014-06-01 DIAGNOSIS — Z8572 Personal history of non-Hodgkin lymphomas: Secondary | ICD-10-CM | POA: Diagnosis not present

## 2014-06-04 ENCOUNTER — Other Ambulatory Visit: Payer: Self-pay | Admitting: Oncology

## 2014-06-05 ENCOUNTER — Encounter: Payer: Self-pay | Admitting: Neurology

## 2014-06-05 ENCOUNTER — Ambulatory Visit (INDEPENDENT_AMBULATORY_CARE_PROVIDER_SITE_OTHER): Payer: Medicare Other | Admitting: Neurology

## 2014-06-05 VITALS — BP 130/64 | HR 92 | Ht 62.0 in | Wt 122.0 lb

## 2014-06-05 DIAGNOSIS — R112 Nausea with vomiting, unspecified: Secondary | ICD-10-CM

## 2014-06-05 DIAGNOSIS — G20 Parkinson's disease: Secondary | ICD-10-CM

## 2014-06-05 NOTE — Patient Instructions (Signed)
1. Starting Monday hold the Comtan for one week and let us know how you are doing.

## 2014-06-05 NOTE — Progress Notes (Signed)
[name] was seen today in the movement disorders clinic for neurologic consultation at the request of [name].    The consultation is for the evaluation of ataxia and tremor.  The patient has been a previous pt of [name].  [name] dx the patient with parkinsonism and ET.  This is an 80 y/o female with a complex medical hx of both non Hodgkins lymphoma require chemotherapy and breast CA, s/p bilateral mastectomy.  She has a long hx of tremor.  I reviewed [name] notes that were made available to me.  It appears that she presented with right hand resting tremor in 2009, without associated rigidity or bradykinesia.  There were no known medications causing the problem and it appears that she was initially dx with ET and later transitioned the dx to parkinsonism.  The patient reports no tremor in the L hand.  Her husband reports a little tremor in the R leg.  The patient reports that tremor is most significant with relaxing and she has no tremor if her hands are preoccupied.    She has never been on PD medication or been to PT until yesterday.  03/16/13 update:  The pt presents today with her husband.  She has not been seen since February, 2014 at which time I dx her with PD.  She was started on levodopa 25/100 three times per day.  She did not follow up sooner as she had bronchitis and pneumonia for the last 2 months and is just now recovering.  She did go to PT and liked it but would like to go back.  She is doing well on the levodopa.  Her husband rarely notice tremor any longer.  06/28/13 update:    The pt today presents for f/u with her husband who supplements the hx.  She is on carbidopa/levodopa 25/100 three times per day.  She is doing well but notices R arm tremor now that had gone away.  Rarely she will note tremor in the L hand which she had not noted previously.  She does have some cramping of the feet.  She is unsure if this is associated with wearing off.  She did have some  of this while in my office, and it was time for her to redose the carbidopa/levodopa.  She is now attending therapy, and is enjoying and finding benefit from it.  08/08/12 update:  The patient presents today with her husband, who supplements the history.  The patient has Parkinson's disease and is currently on carbidopa/levodopa 25/100, one tablet 3 times per day.  Last visit, she was describing some cramping in her feet and I was trying to figure out whether or not this was an "off" phenomenon.  After paying attention, the patient called and said that she thought it was and we added entacapone.  The cramping in the feet is better but she is now having a stabbing and shooting pain down the back of the leg and to the lateral side of the lower leg that stops at the ankle.  It has stopped her from exercising.  She sometimes has back pain.  This is fairly inconsistent.  The leg pain seems to start about 4 days after she finished rehabilitation, which did seem to help.  She is not sure if the pain is related to the rehabilitation, however.  10/03/13 update:  The patient presents today with her husband, who supplements the history.  The patient is currently on carbidopa/levodopa 25/100, one  tablet 3 times per day.  She is also on Comtan 200 mg, one tablet 3 times per day.  From a Parkinson's standpoint, she has been doing well.  No falls.  No hallucinations.  Her husband rarely sees tremor, and that is primarily when she is in bed at night and he will feel it rather than see it.  Last visit, she was complaining of back pain that was likely sciatica.  I ordered an EMG but they ultimately canceled that.  I tried her on Lyrica but that caused her to be excessively sleepy.  She went to the chiropractor and states that she is doing much better from that regard.  Unfortunately, she has had bronchitis and a urinary tract infection.  She has been on several antibiotics, which have caused her to be nauseated.  She has also had  dental work and had several of her teeth pulled.  Because of these things, she has had a decreased appetite and really has not been able to eat.  She had a temporary partial placed, but her appetite still hasn't been what it used to be, primarily because of the nausea from the antibiotic.  She has not been exercising because of being sick.  She is hoping to get back to that soon.  02/02/14 update:  The patient presents today with her husband, who supplements the history.  The patient is currently on carbidopa/levodopa 25/100, 1 tablet at 9 AM/1 PM and 2 tablets at 6 PM.   She is also on Comtan 200 mg, one tablet 3 times per day.   The discomfort in the foot is much better.  From a Parkinson's standpoint, she has been doing well.  No falls.  No hallucinations.  The patient was screened through our Parkinson's screening program on 01/16/2014 and was either at baseline or better than baseline at all therapies and is scheduled for re-screening in January.  She is not currently exercising.    06/05/14 update:  The patient presents today with her husband, who supplements the history.  The patient is currently on carbidopa/levodopa 25/100, 1 tablet at 9 AM/1 PM and 2 tablets at 6 PM.   She is also on Comtan 200 mg, one tablet 3 times per day.  No falls.  No lightheadedness or near syncope.  No hallucinations.   I reviewed records since last visit.  Weight loss has been a concern.  The dietician has talked with her about suggestions to help, and the patient states that she was told to take Carnation 3 times per day.  Pt states that the biggest issue that has caused weight loss has been persistent nausea, which has been going on for about 2 months.  They really do not think that it is related to the Parkinson's medications, because it did not start with changes in Parkinson's medications.  It actually seemed to start after a visit to a restaurant and then persisted.  She just feels nauseated, has lost taste for food and  has lost appetite.  She followed up with [name] and was started on Zantac about 3 or 4 days ago and does feel better and her blood pressure medication was discontinued.   Neuroimaging has  previously been performed.  An MRI brain was last done in 2010 but pt can no longer have MRI's because of an implantable hearing device.    CT HEAD WITHOUT CONTRAST CT MAXILLOFACIAL WITHOUT CONTRAST   Technique:  Multidetector CT imaging of the head and maxillofacial structures  were performed using the standard protocol without intravenous contrast. Multiplanar CT image reconstructions of the maxillofacial structures were also generated.   Comparison:  MRI brain dated 12/04/2008   CT HEAD   Findings: No evidence of parenchymal hemorrhage or extra-axial fluid collection. No mass lesion, mass effect, or midline shift.   No CT evidence of acute infarction.   Subcortical white matter and periventricular small vessel ischemic changes.  Intracranial atherosclerosis.   Global cortical atrophy, possibly age appropriate.  No ventriculomegaly.   The visualized paranasal sinuses are essentially clear.  The left mastoid air cells are unopacified.  Prior right mastoidectomy.   Metallic device in the right occipital scalp (series 8/image 21). No evidence of calvarial fracture.   IMPRESSION: No evidence of acute intracranial abnormality.   Atrophy with small vessel ischemic changes and intracranial atherosclerosis.   CT MAXILLOFACIAL   Findings:   No evidence of maxillofacial fracture.   The visualized paranasal sinuses are essentially clear.  The left mastoid air cells are unopacified.  Prior right mastoidectomy.   The bilateral orbits, including the retroconal soft tissues, are within normal limits.   The visualized cervical spine is intact to the bottom of C7, noting mild multilevel degenerative changes.   IMPRESSION: No evidence of maxillofacial fracture. A CT brain was recently  performed:    Clinical Data: Alzheimer's. Right hand tremors. History of  lymphoma.  MRI HEAD WITHOUT AND WITH CONTRAST  Technique: Multiplanar, multiecho pulse sequences of the brain and  surrounding structures were obtained according to standard protocol  without and with intravenous contrast  Contrast: 7 ml Multihance IV.  Comparison: CT 03/24/2004.  Findings: There is age appropriate atrophy. This is generalized  atrophy which is not lobar specific. There is moderate patchy  hyperintensity throughout the cerebral white matter bilaterally.  This is compatible with chronic microvascular ischemia. There is  a small cyst in the right basal ganglia. The brainstem and  cerebellum appear normal. Diffusion weighted imaging is negative  for acute infarct. Negative for hemorrhage or mass lesion.  Following contrast infusion there is a small venous angioma in the  right lateral frontal lobe. No evidence of edema or hemorrhage is  identified. No enhancing mass lesion is identified. There is  chronic sinusitis with mucosal thickening in the paranasal sinuses.  There also is an air-fluid level in the left frontal sinus.  IMPRESSION:  Age appropriate atrophy with chronic microvascular ischemia. No  acute infarct.  Small venous angioma in the right frontal lobe.  Acute and chronic sinusitis.   PREVIOUS MEDICATIONS: none to date  ALLERGIES:  No Known Allergies  CURRENT MEDICATIONS:  Current Outpatient Prescriptions on File Prior to Visit  Medication Sig Dispense Refill  . acyclovir (ZOVIRAX) 400 MG tablet TAKE 1 TABLET BY MOUTH 2 TIMES DAILY 60 tablet 3  . amLODipine (NORVASC) 5 MG tablet Take 5 mg by mouth daily.    [name] anastrozole (ARIMIDEX) 1 MG tablet TAKE 1 TABLET (1 MG TOTAL) BY MOUTH DAILY. 90 tablet 3  . Calcium Carbonate-Vitamin D (CALCIUM 600 + D PO) Take 2 capsules by mouth daily.     . carbidopa-levodopa (SINEMET IR) 25-100 MG per tablet one tablet at 9 AM/one tablet at 1 PM and  2 tablets at 6 PM (Patient taking differently: 1 tablet 4 (four) times daily. ) 360 tablet 3  . cetirizine (ZYRTEC) 10 MG tablet Take 10 mg by mouth daily.    . Cholecalciferol 1000 UNITS capsule Take 2,000 Units by mouth daily.    [name]  Clobetasol Prop Crea-Coal Tar 0.05 & 2.3 % KIT Apply topically as needed. For rash on scalp    . entacapone (COMTAN) 200 MG tablet Take 1 tablet (200 mg total) by mouth 3 (three) times daily. 270 tablet 0  . Fluticasone-Salmeterol (ADVAIR DISKUS) 100-50 MCG/DOSE AEPB Inhale 1 puff into the lungs every 12 (twelve) hours.     . mirabegron ER (MYRBETRIQ) 50 MG TB24 tablet Take 50 mg by mouth daily.    . Multiple Vitamins-Minerals (MULTIVITAMIN WITH MINERALS) tablet Take 1 tablet by mouth daily.       No current facility-administered medications on file prior to visit.    PAST MEDICAL HISTORY:   Past Medical History  Diagnosis Date  . NHL (non-Hodgkin's lymphoma)     nhl dx 9/04 breast ca dx1/12  . Asthma   . Hearing loss   . Diabetes mellitus without complication     09/19/12.Marland KitchenMarland Kitchenpt denies  . GERD (gastroesophageal reflux disease)     PAST SURGICAL HISTORY:   Past Surgical History  Procedure Laterality Date  . Mastectomy  2 /8/ 12    bilateral  . Ba-ha ear implant      SOCIAL HISTORY:   History   Social History  . Marital Status: Married    Spouse Name: N/A    Number of Children: N/A  . Years of Education: N/A   Occupational History  . 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 Smoking status: Never Smoker   . Smokeless tobacco: Never Used     Comment: husband wsa smoker  . Alcohol Use: No     Comment: no  . Drug Use: No  . Sexual Activity: Not on file   Other Topics Concern  . Not on file   Social History Narrative    FAMILY HISTORY:   Family Status  Relation Status Death Age  . Father Deceased     CA, lung  . Mother Deceased     CA; complications of splenectomy  . Brother Deceased     3, renal failure, DM-2;  CAD; CA  . Child Alive     5 (4 biological), one with PKU    ROS:  A complete 10 system review of systems was obtained and was unremarkable apart from what is mentioned above.  PHYSICAL EXAMINATION:    VITALS:   Filed Vitals:   06/05/14 1107  BP: 130/64  Pulse: 92  Height: 5\' 2"  (1.575 m)  Weight: 122 lb (55.339 kg)    GEN:  The patient appears stated age and is in NAD. CV:  RRR with 3/6 SEM Lungs:  CTAB Neck:  No bruits.     Neurological examination:  Orientation: The patient is alert and oriented x3.   Cranial nerves: There is good facial symmetry.  The visual fields are full to confrontational testing. The speech is fluent and clear. Soft palate rises symmetrically and there is no tongue deviation. Hearing is intact to conversational tone. Sensation: Sensation is intact to light outh throughout. Motor: Strength is 5/5 in the bilateral upper and lower extremities.   Shoulder shrug is equal and symmetric.  There is no pronator drift. Deep tendon reflexes: Deep tendon reflexes are 3/4 at the bilateral biceps, triceps, brachioradialis, patella and trace at the bilateralachilles. Plantar responses are downgoing bilaterally.  Movement examination: Tone: There is mild rigidity on the left. Abnormal movements: There is rare, irregular tremor on the L.  there is  no tremor  on the right.    Coordination:  There is mild decremation with finger taps on the L Gait and Station: The patient no has trouble arising out of the chair without use of her hands today.  Stride length and arm swing are good. Labs:  Lab Results  Component Value Date   WBC 4.0 04/17/2014   HGB 11.0* 04/17/2014   HCT 32.0* 04/17/2014   MCV 87.9 04/17/2014   PLT 142* 04/17/2014     Chemistry      Component Value Date/Time   NA 145 04/17/2014 1026   NA 139 09/20/2012 0058   K 3.8 04/17/2014 1026   K 4.2 09/20/2012 0058   CL 106 12/26/2012 1110   CL 106 09/20/2012 0058   CO2 30* 04/17/2014 1026   CO2 31  11/27/2011 1526   BUN 16.0 04/17/2014 1026   BUN 24* 09/20/2012 0058   CREATININE 1.2* 04/17/2014 1026   CREATININE 1.10 09/20/2012 0058      Component Value Date/Time   CALCIUM 9.3 04/17/2014 1026   CALCIUM 9.0 11/27/2011 1526   CALCIUM 11.1* 07/15/2006 1212   ALKPHOS 87 04/17/2014 1026   ALKPHOS 89 11/27/2011 1526   AST 20 04/17/2014 1026   AST 19 11/27/2011 1526   ALT <6 04/17/2014 1026   ALT 16 11/27/2011 1526   BILITOT 0.51 04/17/2014 1026   BILITOT 0.2* 11/27/2011 1526      Lab Results  Component Value Date   VITAMINB12 1053* 06/10/2006   Lab Results  Component Value Date   TSH 2.93 09/19/2012    ASSESSMENT/PLAN:  1.  Idiopathic Parkinson's disease, dx 08/2012.  This is evidenced by bradykinesia, tremor, postural instability and rigidity.  There are no atypical features.  -We discussed the diagnosis as well as pathophysiology of the disease.  We discussed treatment options as well as prognostic indicators.  Patient education was provided.  - she is currently on carbidopa/levodopa 25/100 one tablet at 9 AM/one tablet at 1 PM and 2 tablets at 6 PM and will continue this.  Risks, benefits, side effects and alternative therapies were discussed.  The opportunity to ask questions was given and they were answered to the best of my ability.  The patient expressed understanding and willingness to follow the outlined treatment protocols.  -Pt is on entacapone tid and while I doubt this is cause of GI distress as the timing is not right, told her to hold for a week  -dont take carnation protein supplement at same time as levodopa.    -May need slightly more levodopa but didn't want to make changes while having GI issues.  May need GI referral to r/o other issues like H pylori but PCP investigating and greatly appreciate input.  -Encouraged restart exercise.  Pt education provided. 2. Return in about 4 months (around 10/04/2014).

## 2014-06-08 ENCOUNTER — Telehealth: Payer: Self-pay | Admitting: Nutrition

## 2014-06-08 NOTE — Telephone Encounter (Signed)
Returned patient phone call to answer questions on nutrition information. Drinking CIB twice daily. Enjoys coupons and is requesting additional coupons. Nausea medication has improved nausea. Questions answered. Mailed coupons for CIB.

## 2014-06-18 ENCOUNTER — Telehealth: Payer: Self-pay | Admitting: *Deleted

## 2014-06-18 NOTE — Telephone Encounter (Signed)
Patient needs you to give her a call back about a medication Call back number [phone]

## 2014-06-18 NOTE — Telephone Encounter (Addendum)
Left message on machine for patient to call back. To discuss how she did after stopping Comtan.

## 2014-06-19 NOTE — Telephone Encounter (Signed)
Spoke with patient and her husband - they state stopping the Comtan did not make any difference with her GI issues. She is going back on this today. They will call if needed.

## 2014-06-28 ENCOUNTER — Ambulatory Visit (HOSPITAL_BASED_OUTPATIENT_CLINIC_OR_DEPARTMENT_OTHER): Payer: Medicare Other | Admitting: Oncology

## 2014-06-28 ENCOUNTER — Other Ambulatory Visit (HOSPITAL_BASED_OUTPATIENT_CLINIC_OR_DEPARTMENT_OTHER): Payer: Medicare Other

## 2014-06-28 ENCOUNTER — Telehealth: Payer: Self-pay | Admitting: Oncology

## 2014-06-28 VITALS — BP 144/64 | HR 76 | Temp 97.6°F | Resp 18 | Ht 62.0 in | Wt 120.7 lb

## 2014-06-28 DIAGNOSIS — C50919 Malignant neoplasm of unspecified site of unspecified female breast: Secondary | ICD-10-CM

## 2014-06-28 DIAGNOSIS — C8599 Non-Hodgkin lymphoma, unspecified, extranodal and solid organ sites: Secondary | ICD-10-CM

## 2014-06-28 DIAGNOSIS — C8590 Non-Hodgkin lymphoma, unspecified, unspecified site: Secondary | ICD-10-CM

## 2014-06-28 DIAGNOSIS — D630 Anemia in neoplastic disease: Secondary | ICD-10-CM

## 2014-06-28 DIAGNOSIS — D6481 Anemia due to antineoplastic chemotherapy: Secondary | ICD-10-CM

## 2014-06-28 LAB — COMPREHENSIVE METABOLIC PANEL (CC13)
ALBUMIN: 3.9 g/dL (ref 3.5–5.0)
ALK PHOS: 86 U/L (ref 40–150)
ALT: <6 U/L (ref 0–55)
AST: 19 U/L (ref 5–34)
Anion Gap: 9 mEq/L (ref 3–11)
BUN: 16.5 mg/dL (ref 7.0–26.0)
CHLORIDE: 105 meq/L (ref 98–109)
CO2: 31 mEq/L — ABNORMAL HIGH (ref 22–29)
Calcium: 9.8 mg/dL (ref 8.4–10.4)
Creatinine: 1.2 mg/dL — ABNORMAL HIGH (ref 0.6–1.1)
EGFR: 42 mL/min/{1.73_m2} — AB (ref >90)
GLUCOSE: 82 mg/dL (ref 70–140)
POTASSIUM: 4.4 meq/L (ref 3.5–5.1)
SODIUM: 144 meq/L (ref 136–145)
Total Bilirubin: 0.39 mg/dL (ref 0.20–1.20)
Total Protein: 6 g/dL — ABNORMAL LOW (ref 6.4–8.3)

## 2014-06-28 LAB — CBC WITH DIFFERENTIAL/PLATELET
BASO%: 0.8 % (ref 0.0–2.0)
BASOS ABS: 0 10*3/uL (ref 0.0–0.1)
EOS ABS: 0.1 10*3/uL (ref 0.0–0.5)
EOS%: 1.7 % (ref 0.0–7.0)
HCT: 33.3 % — ABNORMAL LOW (ref 34.8–46.6)
HEMOGLOBIN: 11.3 g/dL — AB (ref 11.6–15.9)
LYMPH#: 0.8 10*3/uL — AB (ref 0.9–3.3)
LYMPH%: 16.5 % (ref 14.0–49.7)
MCH: 30.4 pg (ref 25.1–34.0)
MCHC: 33.9 g/dL (ref 31.5–36.0)
MCV: 89.5 fL (ref 79.5–101.0)
MONO#: 0.4 10*3/uL (ref 0.1–0.9)
MONO%: 8.8 % (ref 0.0–14.0)
NEUT%: 72.2 % (ref 38.4–76.8)
NEUTROS ABS: 3.5 10*3/uL (ref 1.5–6.5)
Platelets: 132 10*3/uL — ABNORMAL LOW (ref 145–400)
RBC: 3.72 10*6/uL (ref 3.70–5.45)
RDW: 12.6 % (ref 11.2–14.5)
WBC: 4.8 10*3/uL (ref 3.9–10.3)
nRBC: 0 % (ref 0–0)

## 2014-06-28 MED ORDER — ANASTROZOLE 1 MG PO TABS: 1.0000 mg | ORAL_TABLET | Freq: Every day | ORAL | Status: DC

## 2014-06-28 NOTE — Telephone Encounter (Signed)
gv adn printed appt sched and avs for pt for April 2016 °

## 2014-06-28 NOTE — Progress Notes (Signed)
  [facility] OFFICE PROGRESS NOTE   Diagnosis: Non-Hodgkin's lymphoma, breast cancer    INTERVAL HISTORY:   She returns as scheduled. She continues to have malaise. Zantac helped the nausea. She continues Arimidex. The Parkinson's symptoms have improved. She feels weaker in the mornings.  Objective:  Vital signs in last 24 hours:  Blood pressure 144/64, pulse 76, temperature 97.6 F (36.4 C), temperature source Oral, resp. rate 18, height $RemoveBe'5\' 2"'[id number]$  (1.575 m), weight 120 lb 11.2 oz (54.749 kg).    HEENT: Neck without mass Lymphatics: No cervical, supraclavicular, axillary, or inguinal nodes Resp: Scattered end inspiratory rhonchi, no respiratory distress Cardio: Regular rate and rhythm, 2/6 systolic murmur GI: No hepatosplenic the, nontender, no mass Vascular: No leg edema Breast: Status post bilateral mastectomy, no evidence for chest wall tumor recurrence.   Portacath/PICC-without erythema  Lab Results:  Lab Results  Component Value Date   WBC 4.8 06/28/2014   HGB 11.3* 06/28/2014   HCT 33.3* 06/28/2014   MCV 89.5 06/28/2014   PLT 132* 06/28/2014   NEUTROABS 3.5 06/28/2014    Medications: I have reviewed the patient's current medications.  Assessment/Plan: 1. Non-Hodgkin lymphoma treated with fludarabine/rituximab, last in July of 2009.  1. Restaging CT 11/24/2010 revealed evidence of progressive lymphoma in the abdomen and pelvis.  2. Initiation of salvage therapy with bendamustine/rituximab 12/04/2010. She completed 3 cycles.  3. Restaging CT 03/03/2011 revealed stable retroperitoneal soft tissue and a marked decrease in the soft tissue thickening associated with dilated loops of small bowel. 2. History of ITP. Intermittent mild thrombocytopenia persists. 3. History of Herpes zoster-maintained on prophylactic acyclovir. 4. Anemia secondary to non-Hodgkin lymphoma, chemotherapy, and a history of autoimmune hemolysis.   5. Hypertension. 6. Right-hand tremor/ataxia-followed by neurology, now taking Sinemet, diagnosed with Parkinson's disease 7. Hearing loss-status post placement of an implanted hearing device by Dr. Cresenciano Lick. 8. Bilateral breast cancer, right breast cancer-grade 1, T1 N0, ER/PR positive, and HER2 negative. Left breast cancer, synchronous grade 2, T2 N0, ER/PR positive, and HER2 negative. She began adjuvant Arimidex on 09/15/2010. 9. Inflammatory changes of both lungs on a CT of the chest 11/24/2010-progressive on the restaging CT 03/03/2011, status post a bronchoscopy 03/17/2011 with no evidence of granulomata or tumor. 10. 11 mm spiculated lesion in the lingula on a CT 11/24/2010-less distinct on the CT 03/03/2011. 11. Upper respiratory infection while visiting her son in [place], June 2013, resolved after treatment with Levaquin/steroids 12. Upper respiratory infection,? Pneumonia summer 2014-chest x-ray 02/17/2013 with bilateral infiltrates   Disposition:  She remains in clinical remission from breast cancer and lymphoma. She will continue Arimidex. Ms. Krass will return for an office visit in 4 months.  Betsy Coder, MD  06/28/2014  11:29 AM

## 2014-07-23 DIAGNOSIS — J069 Acute upper respiratory infection, unspecified: Secondary | ICD-10-CM | POA: Diagnosis not present

## 2014-08-01 DIAGNOSIS — F329 Major depressive disorder, single episode, unspecified: Secondary | ICD-10-CM | POA: Diagnosis not present

## 2014-08-01 DIAGNOSIS — R5382 Chronic fatigue, unspecified: Secondary | ICD-10-CM | POA: Diagnosis not present

## 2014-08-01 DIAGNOSIS — E785 Hyperlipidemia, unspecified: Secondary | ICD-10-CM | POA: Diagnosis not present

## 2014-08-01 DIAGNOSIS — I10 Essential (primary) hypertension: Secondary | ICD-10-CM | POA: Diagnosis not present

## 2014-08-10 DIAGNOSIS — R5382 Chronic fatigue, unspecified: Secondary | ICD-10-CM | POA: Diagnosis not present

## 2014-08-10 DIAGNOSIS — E785 Hyperlipidemia, unspecified: Secondary | ICD-10-CM | POA: Diagnosis not present

## 2014-08-23 ENCOUNTER — Ambulatory Visit: Payer: Medicare Other | Admitting: Occupational Therapy

## 2014-08-23 ENCOUNTER — Ambulatory Visit: Payer: Medicare Other | Attending: Neurology | Admitting: Physical Therapy

## 2014-08-23 ENCOUNTER — Ambulatory Visit: Payer: Medicare Other

## 2014-08-23 DIAGNOSIS — G20 Parkinson's disease: Secondary | ICD-10-CM

## 2014-08-23 DIAGNOSIS — R269 Unspecified abnormalities of gait and mobility: Secondary | ICD-10-CM

## 2014-08-23 DIAGNOSIS — R490 Dysphonia: Secondary | ICD-10-CM

## 2014-08-23 DIAGNOSIS — R258 Other abnormal involuntary movements: Secondary | ICD-10-CM

## 2014-08-23 NOTE — Therapy (Signed)
[facility] [address] [place], [name], [address] Phone: [phone]   Fax:  [phone]  Patient Details  Name: [name] MRN: [id number] Date of Birth: [birth year] Referring Provider:  [name], MD  Encounter Date: 08/23/2014 Physical Therapy Parkinson's Disease Screen   Timed Up and Go test:10.22 sec  10 meter walk test: 3.72 ft/sec  5 time sit to stand test:11.41 sec  Patient would benefit from Physical Therapy evaluation due to updating HEP and addressing pt's reported difficulties in morning functional mobility activities.  Mady Haagensen, PT 08/23/2014 9:48 AM Phone: [phone] Fax: [phone]   Frazier Butt. 08/23/2014, 9:35 AM  [facility] [address] [place], [name], [address] Phone: [phone]   Fax:  [phone]

## 2014-08-23 NOTE — Therapy (Signed)
[facility] [address] [place], [name], [address] Phone: [phone]   Fax:  [phone]  Patient Details  Name: [name] MRN: [id number] Date of Birth: [birth year] Referring Provider:  [name], MD  Encounter Date: 08/23/2014   Speech Therapy Parkinson's Disease Screen  Date: 08-23-14    Decibel Level today in 5 minute conversation: 69dB  (WNL=70-72 dB) with sound level meter 30cm away from pt's mouth. Conversational volume has decreased since last treatment course.  Pt stated she experiences coughing with meals approx 1-2/week.  Pt would benefit from a Speech-language eval for dysarthria, please order via EPIC if agreed. SLP will monitor swallowing status during her next treatment course.    [place], [place] 08/23/2014, 10:13 AM  [facility] [address] [place], [name], [address] Phone: [phone]   Fax:  [phone]

## 2014-08-23 NOTE — Therapy (Signed)
[facility] [address] [place], [name], [address] Phone: [phone]   Fax:  [phone]  Patient Details  Name: [name] MRN: [id number] Date of Birth: 08-Apr-1928 Referring Provider:  [name], MD  Encounter Date: 08/23/2014  Occupational Therapy Parkinson's Disease Screen  Physical Performance Test item #4 (donning/doffing jacket):  17.81sec  9-hole peg test:    RUE  27.09sec        LUE  28.44sec  Box & Blocks Test:   RUE  55 blocks        LUE  50 blocks  Change in ability to perform ADLs/IADLs:  Pt reports difficulty early in morning and increased time to get ready.  Pt reports that she feels better by lunchtime.  Pt would benefit from occupational therapy evaluation due to  Difficulty/increased time needed for ADLs.   [facility], OTR/L 08/23/2014, 9:44 AM  [facility] [address] [place], [name], [address] Phone: [phone]   Fax:  [phone]

## 2014-09-21 ENCOUNTER — Other Ambulatory Visit: Payer: Self-pay | Admitting: Oncology

## 2014-10-02 DIAGNOSIS — F419 Anxiety disorder, unspecified: Secondary | ICD-10-CM | POA: Diagnosis not present

## 2014-10-02 DIAGNOSIS — R110 Nausea: Secondary | ICD-10-CM | POA: Diagnosis not present

## 2014-10-02 DIAGNOSIS — I10 Essential (primary) hypertension: Secondary | ICD-10-CM | POA: Diagnosis not present

## 2014-10-02 DIAGNOSIS — G20 Parkinson's disease: Secondary | ICD-10-CM | POA: Diagnosis not present

## 2014-10-04 ENCOUNTER — Ambulatory Visit (INDEPENDENT_AMBULATORY_CARE_PROVIDER_SITE_OTHER): Payer: Medicare Other | Admitting: Neurology

## 2014-10-04 ENCOUNTER — Encounter: Payer: Self-pay | Admitting: Neurology

## 2014-10-04 VITALS — BP 110/60 | HR 56 | Ht 63.0 in | Wt 116.0 lb

## 2014-10-04 DIAGNOSIS — R112 Nausea with vomiting, unspecified: Secondary | ICD-10-CM | POA: Diagnosis not present

## 2014-10-04 DIAGNOSIS — G20 Parkinson's disease: Secondary | ICD-10-CM

## 2014-10-04 DIAGNOSIS — K219 Gastro-esophageal reflux disease without esophagitis: Secondary | ICD-10-CM | POA: Diagnosis not present

## 2014-10-04 NOTE — Progress Notes (Signed)
[name] was seen today in the movement disorders clinic for neurologic consultation at the request of Dr. Betsy Coder.    The consultation is for the evaluation of ataxia and tremor.  The patient has been a previous pt of Dr. Erling Mathews.  Dr. Erling Mathews dx the patient with parkinsonism and ET.  This is an 81 y/o female with a complex medical hx of both non Hodgkins lymphoma require chemotherapy and breast CA, s/p bilateral mastectomy.  She has a long hx of tremor.  I reviewed Dr. Bernardo Heater notes that were made available to me.  It appears that she presented with right hand resting tremor in 2009, without associated rigidity or bradykinesia.  There were no known medications causing the problem and it appears that she was initially dx with ET and later transitioned the dx to parkinsonism.  The patient reports no tremor in the L hand.  Her husband reports a little tremor in the R leg.  The patient reports that tremor is most significant with relaxing and she has no tremor if her hands are preoccupied.    She has never been on PD medication or been to PT until yesterday.  03/16/13 update:  The pt presents today with her husband.  She has not been seen since February, 2014 at which time I dx her with PD.  She was started on levodopa 25/100 three times per day.  She did not follow up sooner as she had bronchitis and pneumonia for the last 2 months and is just now recovering.  She did go to PT and liked it but would like to go back.  She is doing well on the levodopa.  Her husband rarely notice tremor any longer.  06/28/13 update:    The pt today presents for f/u with her husband who supplements the hx.  She is on carbidopa/levodopa 25/100 three times per day.  She is doing well but notices R arm tremor now that had gone away.  Rarely she will note tremor in the L hand which she had not noted previously.  She does have some cramping of the feet.  She is unsure if this is associated with wearing off.  She did have some  of this while in my office, and it was time for her to redose the carbidopa/levodopa.  She is now attending therapy, and is enjoying and finding benefit from it.  08/08/12 update:  The patient presents today with her husband, who supplements the history.  The patient has Parkinson's disease and is currently on carbidopa/levodopa 25/100, one tablet 3 times per day.  Last visit, she was describing some cramping in her feet and I was trying to figure out whether or not this was an "off" phenomenon.  After paying attention, the patient called and said that she thought it was and we added entacapone.  The cramping in the feet is better but she is now having a stabbing and shooting pain down the back of the leg and to the lateral side of the lower leg that stops at the ankle.  It has stopped her from exercising.  She sometimes has back pain.  This is fairly inconsistent.  The leg pain seems to start about 4 days after she finished rehabilitation, which did seem to help.  She is not sure if the pain is related to the rehabilitation, however.  10/03/13 update:  The patient presents today with her husband, who supplements the history.  The patient is currently on carbidopa/levodopa 25/100, one  tablet 3 times per day.  She is also on Comtan 200 mg, one tablet 3 times per day.  From a Parkinson's standpoint, she has been doing well.  No falls.  No hallucinations.  Her husband rarely sees tremor, and that is primarily when she is in bed at night and he will feel it rather than see it.  Last visit, she was complaining of back pain that was likely sciatica.  I ordered an EMG but they ultimately canceled that.  I tried her on Lyrica but that caused her to be excessively sleepy.  She went to the chiropractor and states that she is doing much better from that regard.  Unfortunately, she has had bronchitis and a urinary tract infection.  She has been on several antibiotics, which have caused her to be nauseated.  She has also had  dental work and had several of her teeth pulled.  Because of these things, she has had a decreased appetite and really has not been able to eat.  She had a temporary partial placed, but her appetite still hasn't been what it used to be, primarily because of the nausea from the antibiotic.  She has not been exercising because of being sick.  She is hoping to get back to that soon.  02/02/14 update:  The patient presents today with her husband, who supplements the history.  The patient is currently on carbidopa/levodopa 25/100, 1 tablet at 9 AM/1 PM and 2 tablets at 6 PM.   She is also on Comtan 200 mg, one tablet 3 times per day.   The discomfort in the foot is much better.  From a Parkinson's standpoint, she has been doing well.  No falls.  No hallucinations.  The patient was screened through our Parkinson's screening program on 01/16/2014 and was either at baseline or better than baseline at all therapies and is scheduled for re-screening in January.  She is not currently exercising.    06/05/14 update:  The patient presents today with her husband, who supplements the history.  The patient is currently on carbidopa/levodopa 25/100, 1 tablet at 9 AM/1 PM and 2 tablets at 6 PM.   She is also on Comtan 200 mg, one tablet 3 times per day.  No falls.  No lightheadedness or near syncope.  No hallucinations.   I reviewed records since last visit.  Weight loss has been a concern.  The dietician has talked with her about suggestions to help, and the patient states that she was told to take Carnation 3 times per day.  Pt states that the biggest issue that has caused weight loss has been persistent nausea, which has been going on for about 2 months.  They really do not think that it is related to the Parkinson's medications, because it did not start with changes in Parkinson's medications.  It actually seemed to start after a visit to a restaurant and then persisted.  She just feels nauseated, has lost taste for food and  has lost appetite.  She followed up with Dr. Kenton Kingfisher and was started on Zantac about 3 or 4 days ago and does feel better and her blood pressure medication was discontinued.  10/04/14 update:  The patient presents today with her husband, who supplements the history.  The patient is currently on carbidopa/levodopa 25/100, 1 tablet at 9 AM/1 PM and 2 tablets at 6 PM.   She is also on Comtan 200 mg, one tablet 3 times per day. We d/c her comtan  for a week to see if her GI upset changed at all and it did not so we restarted it since obviously that was not the etiology.  She continues to have GI upset in the AM.  She went to her PCP and she was given Zofran and states that it made it worse but she only took it once.  It is only the AM when she has nausea and she does better the rest of the day.     Neuroimaging has  previously been performed.  An MRI brain was last done in 2010 but pt can no longer have MRI's because of an implantable hearing device.    PREVIOUS MEDICATIONS: none to date  ALLERGIES:  No Known Allergies  CURRENT MEDICATIONS:  Current Outpatient Prescriptions on File Prior to Visit  Medication Sig Dispense Refill  . acyclovir (ZOVIRAX) 400 MG tablet TAKE 1 TABLET BY MOUTH 2 TIMES DAILY 60 tablet 3  . ALPRAZolam (XANAX) 0.25 MG tablet Take 0.25 mg by mouth as needed for anxiety. Patient taking approx 2 times weekly    . amLODipine (NORVASC) 5 MG tablet Take 2.5 mg by mouth daily.     [name] anastrozole (ARIMIDEX) 1 MG tablet Take 1 tablet (1 mg total) by mouth daily. 90 tablet 3  . Calcium Carbonate-Vitamin D (CALCIUM 600 + D PO) Take 2 capsules by mouth daily.     . carbidopa-levodopa (SINEMET IR) 25-100 MG per tablet one tablet at 9 AM/one tablet at 1 PM and 2 tablets at 6 PM (Patient taking differently: 1 tablet 4 (four) times daily. ) 360 tablet 3  . cetirizine (ZYRTEC) 10 MG tablet Take 10 mg by mouth daily.    . Cholecalciferol 1000 UNITS capsule Take 2,000 Units by mouth daily.    .  entacapone (COMTAN) 200 MG tablet Take 1 tablet (200 mg total) by mouth 3 (three) times daily. 270 tablet 0  . Fluticasone-Salmeterol (ADVAIR DISKUS) 100-50 MCG/DOSE AEPB Inhale 1 puff into the lungs every 12 (twelve) hours.     . Multiple Vitamins-Minerals (MULTIVITAMIN WITH MINERALS) tablet Take 1 tablet by mouth daily.      [name] oxybutynin (DITROPAN-XL) 10 MG 24 hr tablet Take 10 mg by mouth at bedtime.    . ranitidine (ZANTAC) 300 MG tablet Take 300 mg by mouth 2 (two) times daily.     No current facility-administered medications on file prior to visit.    PAST MEDICAL HISTORY:   Past Medical History  Diagnosis Date  . NHL (non-Hodgkin's lymphoma)     nhl dx 9/04 breast ca dx1/12  . Asthma   . Hearing loss   . Diabetes mellitus without complication     6/38/17.Marland KitchenMarland Kitchenpt denies  . GERD (gastroesophageal reflux disease)     PAST SURGICAL HISTORY:   Past Surgical History  Procedure Laterality Date  . Mastectomy  2 /8/ 12    bilateral  . Ba-ha ear implant      SOCIAL HISTORY:   History   Social History  . Marital Status: Married    Spouse Name: N/A  . Number of Children: N/A  . Years of Education: N/A   Occupational History  . retired     Pharmacist, hospital   Social History Main Topics  . Smoking status: Never Smoker   . Smokeless tobacco: Never Used     Comment: husband wsa smoker  . Alcohol Use: No     Comment: no  . Drug Use: No  . Sexual Activity: Not  on file   Other Topics Concern  . Not on file   Social History Narrative    FAMILY HISTORY:   Family Status  Relation Status Death Age  . Father Deceased     CA, lung  . Mother Deceased     CA; complications of splenectomy  . Brother Deceased     3, renal failure, DM-2; CAD; CA  . Child Alive     5 (4 biological), one with PKU    ROS:  A complete 10 system review of systems was obtained and was unremarkable apart from what is mentioned above.  PHYSICAL EXAMINATION:    VITALS:   Filed Vitals:   10/04/14  1114  BP: 110/60  Pulse: 56  Height: 5\' 3"  (1.6 m)  Weight: 116 lb (52.617 kg)    GEN:  The patient appears stated age and is in NAD. CV:  RRR with 3/6 SEM Lungs:  CTAB Neck:  No bruits.     Neurological examination:  Orientation: The patient is alert and oriented x3.   Cranial nerves: There is good facial symmetry.  The visual fields are full to confrontational testing. The speech is fluent and clear. Soft palate rises symmetrically and there is no tongue deviation. Hearing is intact to conversational tone. Sensation: Sensation is intact to light outh throughout. Motor: Strength is 5/5 in the bilateral upper and lower extremities.   Shoulder shrug is equal and symmetric.  There is no pronator drift.   Movement examination: Tone: There is no rigidity. Abnormal movements: There is no tremor noted today Coordination:  There is no decremation noted today Gait and Station: The patient no has trouble arising out of the chair without use of her hands today.  Stride length and arm swing are good. Labs:  Lab Results  Component Value Date   WBC 4.8 06/28/2014   HGB 11.3* 06/28/2014   HCT 33.3* 06/28/2014   MCV 89.5 06/28/2014   PLT 132* 06/28/2014     Chemistry      Component Value Date/Time   NA 144 06/28/2014 1046   NA 139 09/20/2012 0058   K 4.4 06/28/2014 1046   K 4.2 09/20/2012 0058   CL 106 12/26/2012 1110   CL 106 09/20/2012 0058   CO2 31* 06/28/2014 1046   CO2 31 11/27/2011 1526   BUN 16.5 06/28/2014 1046   BUN 24* 09/20/2012 0058   CREATININE 1.2* 06/28/2014 1046   CREATININE 1.10 09/20/2012 0058      Component Value Date/Time   CALCIUM 9.8 06/28/2014 1046   CALCIUM 9.0 11/27/2011 1526   CALCIUM 11.1* 07/15/2006 1212   ALKPHOS 86 06/28/2014 1046   ALKPHOS 89 11/27/2011 1526   AST 19 06/28/2014 1046   AST 19 11/27/2011 1526   ALT <6 06/28/2014 1046   ALT 16 11/27/2011 1526   BILITOT 0.39 06/28/2014 1046   BILITOT 0.2* 11/27/2011 1526      Lab Results   Component Value Date   VITAMINB12 1053* 06/10/2006   Lab Results  Component Value Date   TSH 2.93 09/19/2012    ASSESSMENT/PLAN:  1.  Idiopathic Parkinson's disease, dx 08/2012.  This is evidenced by bradykinesia, tremor, postural instability and rigidity.  There are no atypical features.  -We discussed the diagnosis as well as pathophysiology of the disease.  We discussed treatment options as well as prognostic indicators.  Patient education was provided.  - she is currently on carbidopa/levodopa 25/100 one tablet at 9 AM/one tablet at 1 PM  and 2 tablets at 6 PM and will continue this.  Risks, benefits, side effects and alternative therapies were discussed.  The opportunity to ask questions was given and they were answered to the best of my ability.  The patient expressed understanding and willingness to follow the outlined treatment protocols.  -Pt is on entacapone tid and she will continue on this  2.  AM nausea  -Wonder if due to reflux. Discussed things like protonix/nexium, etc but leaving to discretion of PCP as out of my field.  Already on zantac.    Tried to stop the comtan and made no difference in the sx's.  May need GI referral to r/o other issues like H pylori but PCP investigating and greatly appreciate input.  -Encouraged restart exercise.  Pt education provided. 3. Return in about 4 months (around 02/03/2015).

## 2014-10-04 NOTE — Progress Notes (Signed)
Note faxed to Dr Kenton Kingfisher at [phone] with confirmation received.

## 2014-10-11 ENCOUNTER — Telehealth: Payer: Self-pay | Admitting: Neurology

## 2014-10-11 DIAGNOSIS — G20 Parkinson's disease: Secondary | ICD-10-CM

## 2014-10-11 NOTE — Telephone Encounter (Signed)
-----   Message from Delton Prairie, OT sent at 10/11/2014  1:24 PM EDT ----- Regarding: Recommended therapy We have completed therapy screens for [name] and recommended OT, ST, and PT evaluations due to change in voice volume and difficulties with ADLs and mobility, particularly in the mornings.  Please send referrals via Epic if you agree.  Thanks!

## 2014-10-11 NOTE — Telephone Encounter (Signed)
Order entered in EPIC

## 2014-10-12 ENCOUNTER — Other Ambulatory Visit: Payer: Self-pay | Admitting: Neurology

## 2014-10-15 NOTE — Telephone Encounter (Signed)
Comtan refill requested. Per last office note- patient to remain on medication. Refill approved and sent to patient's pharmacy.

## 2014-11-09 ENCOUNTER — Ambulatory Visit: Payer: Medicare Other | Attending: Neurology | Admitting: Physical Therapy

## 2014-11-09 DIAGNOSIS — R4189 Other symptoms and signs involving cognitive functions and awareness: Secondary | ICD-10-CM | POA: Insufficient documentation

## 2014-11-09 DIAGNOSIS — R279 Unspecified lack of coordination: Secondary | ICD-10-CM | POA: Insufficient documentation

## 2014-11-09 DIAGNOSIS — R490 Dysphonia: Secondary | ICD-10-CM | POA: Insufficient documentation

## 2014-11-09 DIAGNOSIS — R258 Other abnormal involuntary movements: Secondary | ICD-10-CM | POA: Insufficient documentation

## 2014-11-09 DIAGNOSIS — R269 Unspecified abnormalities of gait and mobility: Secondary | ICD-10-CM | POA: Diagnosis not present

## 2014-11-09 DIAGNOSIS — R29898 Other symptoms and signs involving the musculoskeletal system: Secondary | ICD-10-CM | POA: Insufficient documentation

## 2014-11-09 NOTE — Addendum Note (Signed)
Addended by: Frazier Butt on: 11/09/2014 12:15 PM   Modules accepted: Orders

## 2014-11-09 NOTE — Therapy (Signed)
[facility] [address] [place], [name], [address] Phone: [phone]   Fax:  [phone]  Physical Therapy Evaluation  Patient Details  Name: [name] MRN: [id number] Date of Birth: 03/10/1928 Referring Provider:  [name], MD  Encounter Date: 11/09/2014      PT End of Session - 11/09/14 1202    Visit Number 1   Number of Visits 9  recommend 2x/wk 4 wks plus eval   Date for PT Re-Evaluation 01/08/15   PT Start Time 0849   PT Stop Time 0930   PT Time Calculation (min) 41 min   Activity Tolerance Patient tolerated treatment well   Behavior During Therapy [facility] for tasks assessed/performed      Past Medical History  Diagnosis Date  . NHL (non-Hodgkin's lymphoma)     nhl dx 9/04 breast ca dx1/12  . Asthma   . Hearing loss   . Diabetes mellitus without complication     7/79/39.Marland KitchenMarland Kitchenpt denies  . GERD (gastroesophageal reflux disease)     Past Surgical History  Procedure Laterality Date  . Mastectomy  2 /8/ 12    bilateral  . Ba-ha ear implant      There were no vitals filed for this visit.  Visit Diagnosis:  Bradykinesia  Abnormality of gait      Subjective Assessment - 11/09/14 0853    Subjective Pt is an 81 year old female who presents to OP PT with Parkinson's disease.  She takes most of her medications in the morning, and she feels sometimes nauseated and tired after this.  She reports usually feeling better after lunch.  She does not use assistive device.  She reports no falls.     Patient Stated Goals Pt's goals for therapy are to walk as I should and move as well as I can.   Currently in Pain? No/denies            [facility] PT Assessment - 11/09/14 0857    Assessment   Medical Diagnosis Parkinson's disease   Onset Date --  2014   Precautions   Precautions Fall   Balance Screen   Has the patient fallen in the past 6 months No   Has the patient had a decrease in activity  level because of a fear of falling?  Yes   Is the patient reluctant to leave their home because of a fear of falling?  No   Home Environment   Living Enviornment Private residence   Living Arrangements Alone   Available Help at Discharge Family   Type of [place] to enter   Entrance Stairs-Number of Steps 3   Entrance Stairs-Rails Can reach both   [place] One level   [place] - 4 wheels;Cane - single point;Hand held shower head   Prior Function   Level of Independence Independent with homemaking with ambulation;Independent with basic ADLs;Independent with gait  Has someone come to clean the home   Leisure Enjoys singing in choir; does walk at [employer] track 2 times per week   ROM / Strength   AROM / PROM / Strength Strength   Strength   Strength Assessment Site Hip;Knee;Ankle   Right/Left Hip Right;Left   Right Hip Flexion 4/5   Left Hip Flexion 4/5   Right/Left Knee Right;Left   Right Knee Flexion 4/5   Right Knee Extension 4/5   Left Knee Flexion 4/5   Left Knee Extension 4/5  Right/Left Ankle Right;Left   Right Ankle Dorsiflexion 3+/5   Left Ankle Dorsiflexion 3+/5   Transfers   Transfers Sit to Stand;Stand to Sit   Sit to Stand Five times sit to stand;7: Independent;From chair/3-in-1;Without upper extremity assist  5x sit<>stand:  11.31 sec   Stand to Sit 7: Independent;Without upper extremity assist;To chair/3-in-1   Ambulation/Gait   Ambulation/Gait Yes   Gait velocity 9.9 sec =3.31 ft/sec   Gait velocity - backwards 17.16 sec in 10 ft=0.58 ft/sec   Standardized Balance Assessment   Standardized Balance Assessment Timed Up and Go Test   Timed Up and Go Test   TUG Normal TUG;Manual TUG;Cognitive TUG   Normal TUG (seconds) 10.55   Manual TUG (seconds) 9.79   Cognitive TUG (seconds) 10.27   Functional Gait  Assessment   Gait assessed  Yes   Gait Level Surface Walks 20 ft in less than 7 sec but greater than 5.5 sec, uses  assistive device, slower speed, mild gait deviations, or deviates 6-10 in outside of the 12 in walkway width.  6.34   Change in Gait Speed Able to smoothly change walking speed without loss of balance or gait deviation. Deviate no more than 6 in outside of the 12 in walkway width.   Gait with Horizontal Head Turns Performs head turns smoothly with slight change in gait velocity (eg, minor disruption to smooth gait path), deviates 6-10 in outside 12 in walkway width, or uses an assistive device.   Gait with Vertical Head Turns Performs task with moderate change in gait velocity, slows down, deviates 10-15 in outside 12 in walkway width but recovers, can continue to walk.   Gait and Pivot Turn Pivot turns safely within 3 sec and stops quickly with no loss of balance.   Step Over Obstacle Is able to step over one shoe box (4.5 in total height) but must slow down and adjust steps to clear box safely. May require verbal cueing.   Gait with Narrow Base of Support Ambulates 4-7 steps.   Gait with Eyes Closed Cannot walk 20 ft without assistance, severe gait deviations or imbalance, deviates greater than 15 in outside 12 in walkway width or will not attempt task.   Ambulating Backwards Walks 20 ft, slow speed, abnormal gait pattern, evidence for imbalance, deviates 10-15 in outside 12 in walkway width.  10 ft=17.16 sec   Steps Alternating feet, must use rail.   Total Score 16                           PT Education - 11/09/14 1201    Education provided Yes   Education Details HEP-seated and standing heel/toe raises; sit<>stand as part of HEP   Person(s) Educated Patient   Methods Explanation;Demonstration;Handout   Comprehension Verbalized understanding;Returned demonstration             PT Long Term Goals - 11/09/14 1208    PT LONG TERM GOAL #1   Title Pt will perform HEP with family supervision for improved balance, strength and gait. (4 wk Target 12/08/14)   Time 4    Period Weeks   Status New   PT LONG TERM GOAL #2   Title Pt will improve Functional Gait Assessment to at least 20/30 for decreased fall risk.   Time 4   Period Weeks   Status New   PT LONG TERM GOAL #3   Title Pt will improve gait velocity backwards direction to at  least 1 ft/sec for improved balance recovery in posterior direction.   Time 4   Period Weeks   Status New   PT LONG TERM GOAL #4   Title Pt will verbalize plans for continued community fitness upon D/C from PT.   Time 4   Period Weeks   Status New               Plan - 11-30-2014 1200-11-14    Clinical Impression Statement Pt is an 81 year old female with Parkinson's disease who presents to OP PT with bradykinesia, decreased timing and coordination with gait, decreased balance with gait.  Pt reports having overall decreased mobility over the past several months due to episodes of nausea and lethargy.   Pt will benefit from skilled therapeutic intervention in order to improve on the following deficits Abnormal gait;Decreased balance;Decreased mobility;Decreased strength;Postural dysfunction   Rehab Potential Good   PT Frequency 2x / week   PT Duration 4 weeks  plus eval   PT Treatment/Interventions ADLs/Self Care Home Management;Gait training;Stair training;Functional mobility training;Therapeutic activities;Neuromuscular re-education;Balance training;Therapeutic exercise;Patient/family education   PT Next Visit Plan Review ankle stregnthening and sit<>stand as HEP given at eval; Initiate additions to HEP   PT Home Exercise Plan ?OTAGO vs PWR!   Consulted and Agree with Plan of Care Patient          G-Codes - 11-30-2014 11-15-10    Functional Assessment Tool Used Functional Gait Assessment 16/30, back gait velocity 0.58 ft/sec   Functional Limitation Mobility: Walking and moving around   Mobility: Walking and Moving Around Current Status [phone]) At least 20 percent but less than 40 percent impaired, limited or restricted    Mobility: Walking and Moving Around Goal Status [phone]) At least 1 percent but less than 20 percent impaired, limited or restricted       Problem List Patient Active Problem List   Diagnosis Date Noted  . Paralysis agitans 09/19/2012  . Lymphoma 07/03/2011  . Malignant neoplasm of breast (female), unspecified site 07/03/2011  . Abnormal CT of the chest 03/13/2011  . HYPERTENSION, BENIGN 01/01/2009  . EDEMA 01/01/2009    Daquavion Catala W. 11/30/14, 12:13 PM  Mady Haagensen, PT 11-30-14 12:13 PM Phone: [phone] Fax: [facility] [address] [place], [name], [address] Phone: [phone]   Fax:  [phone]

## 2014-11-09 NOTE — Patient Instructions (Signed)
Toe / Heel Raise   A)  Standing:  Gently rock back on heels and raise toes. Then rock forward on toes and raise heels.  Hold each position 3 seconds. Repeat sequence __20__ times per session. Do _2-3___ sessions per day.    B) You can do these heel toe raises in sitting as well.  Copyright  VHI. All rights reserved.  Functional Quadriceps: Sit to Stand   Sit on edge of chair, feet flat on floor. Lean forward, stand upright, extending knees fully.  Stand as tall as you can, then sit back down. Repeat _10___ times per set. Do __1-2__ sessions per day.  http://orth.exer.us/735   Copyright  VHI. All rights reserved.

## 2014-11-13 ENCOUNTER — Ambulatory Visit: Payer: Medicare Other | Admitting: Speech Pathology

## 2014-11-13 ENCOUNTER — Ambulatory Visit: Payer: Medicare Other

## 2014-11-13 ENCOUNTER — Ambulatory Visit: Payer: Medicare Other | Admitting: Occupational Therapy

## 2014-11-13 DIAGNOSIS — R279 Unspecified lack of coordination: Secondary | ICD-10-CM | POA: Diagnosis not present

## 2014-11-13 DIAGNOSIS — R4189 Other symptoms and signs involving cognitive functions and awareness: Secondary | ICD-10-CM | POA: Diagnosis not present

## 2014-11-13 DIAGNOSIS — R269 Unspecified abnormalities of gait and mobility: Secondary | ICD-10-CM | POA: Diagnosis not present

## 2014-11-13 DIAGNOSIS — R29898 Other symptoms and signs involving the musculoskeletal system: Secondary | ICD-10-CM | POA: Diagnosis not present

## 2014-11-13 DIAGNOSIS — R490 Dysphonia: Secondary | ICD-10-CM

## 2014-11-13 DIAGNOSIS — R258 Other abnormal involuntary movements: Secondary | ICD-10-CM

## 2014-11-13 DIAGNOSIS — G20 Parkinson's disease: Secondary | ICD-10-CM

## 2014-11-13 NOTE — Therapy (Signed)
[facility] [address] [place], [name], [address] Phone: [phone]   Fax:  [phone]  Physical Therapy Treatment  Patient Details  Name: [name] MRN: [id number] Date of Birth: 06/27/1928 Referring Provider:  [name], MD  Encounter Date: 11/13/2014    Past Medical History  Diagnosis Date  . NHL (non-Hodgkin's lymphoma)     nhl dx 9/04 breast ca dx1/12  . Asthma   . Hearing loss   . Diabetes mellitus without complication     5/45/62.Marland KitchenMarland Kitchenpt denies  . GERD (gastroesophageal reflux disease)     Past Surgical History  Procedure Laterality Date  . Mastectomy  2 /8/ 12    bilateral  . Ba-ha ear implant      There were no vitals filed for this visit.  Visit Diagnosis:  No diagnosis found.      Subjective Assessment - 11/13/14 1242    Subjective Pt reports feeling jittery in the morning. She says she is still feeling a bit jittery right now. She has already eaten lunch.    Currently in Pain? No/denies      Reviewed HEP from prior session with correct performance of sit to stands. Pt was recommended to perform ankle taps in standing rather than sitting to emphasize balance.  Taught, performed, provided printed handout with written instructions. Therapist demo and verbalc cues provided today.  PWR basic 4 standing 10-20x each PWR! UP PWR! [employer]! Twist PWR! Step  And Basic 4 sitting 10-20x each  PWR! UP PWR! [employer]! Twist PWR! Step  4 minutes Sci fit with all extremities level 2 with target 80 SPM (10% greater than self selected pace.) Pt maintained target.                                PT Long Term Goals - 11/09/14 1208    PT LONG TERM GOAL #1   Title Pt will perform HEP with family supervision for improved balance, strength and gait. (4 wk Target 12/08/14)   Time 4   Period Weeks   Status New   PT LONG TERM GOAL #2   Title Pt will improve  Functional Gait Assessment to at least 20/30 for decreased fall risk.   Time 4   Period Weeks   Status New   PT LONG TERM GOAL #3   Title Pt will improve gait velocity backwards direction to at least 1 ft/sec for improved balance recovery in posterior direction.   Time 4   Period Weeks   Status New   PT LONG TERM GOAL #4   Title Pt will verbalize plans for continued community fitness upon D/C from PT.   Time 4   Period Weeks   Status New               Problem List Patient Active Problem List   Diagnosis Date Noted  . Paralysis agitans 09/19/2012  . Lymphoma 07/03/2011  . Malignant neoplasm of breast (female), unspecified site 07/03/2011  . Abnormal CT of the chest 03/13/2011  . HYPERTENSION, BENIGN 01/01/2009  . EDEMA 01/01/2009    Delrae Sawyers, PT,DPT,NCS 11/13/2014 4:46 PM Phone [phone] FAX [phone]         [facility] [address] [place], [name], [address] Phone: [phone]   Fax:  [phone]

## 2014-11-13 NOTE — Therapy (Signed)
[facility] [address] [place], [name], [address] Phone: [phone]   Fax:  [phone]  Speech Language Pathology Evaluation  Patient Details  Name: [name] MRN: [id number] Date of Birth: 07-12-1928 Referring Provider:  [name], MD  Encounter Date: 11/13/2014      End of Session - 11/13/14 1355    SLP Start Time 1315   SLP Stop Time  1355   SLP Time Calculation (min) 40 min   Activity Tolerance Patient tolerated treatment well      Past Medical History  Diagnosis Date  . NHL (non-Hodgkin's lymphoma)     nhl dx 9/04 breast ca dx1/12  . Asthma   . Hearing loss   . Diabetes mellitus without complication     8/58/85.Marland KitchenMarland Kitchenpt denies  . GERD (gastroesophageal reflux disease)     Past Surgical History  Procedure Laterality Date  . Mastectomy  2 /8/ 12    bilateral  . Ba-ha ear implant      There were no vitals filed for this visit.  Visit Diagnosis: Hypokinetic Parkinsonian dysphonia      Subjective Assessment - 11/13/14 1322    Subjective "I have terrible mornings, I am jittery and can't get going in the morning"   Currently in Pain? No/denies            SLP Evaluation [facility] - 11/13/14 1322    SLP Visit Information   SLP Received On 11/13/14   Onset Date 2009   Medical Diagnosis Parkinson's Disease   Subjective   Patient/Family Stated Goal To be understood   General Information   HPI Pt known to [place] from prior course of treatment for hypokinetic dysarthira.    Mobility Status walk independently   Prior Functional Status   Cognitive/Linguistic Baseline Within functional limits   Type of Home House    Lives With Spouse   Vocation Retired   Retail buyer   Overall Motor Speech Appears within functional limits for tasks assessed   Respiration Within functional limits   Phonation Hoarse;Low vocal intensity           ADULT SLP TREATMENT - 11/13/14 0001    Cognitive-Linquistic Treatment   Treatment focused on Dysarthria   Skilled Treatment Trained pt in deep breathing before /a/ and facilitiated loud /a/ with cues to think shout with usual mod A. Trained in throat clear alternataives with usual mod A. Facilitated loud speech with oral reading, deep breaths before each sentence with average of 72dB - pt did report she felt too loud - I encouraged her that this is how she should fee and how loud she should be talking.            SLP Education - 11/13/14 1335    Education provided Yes   Education Details HEP sustained /a/, think shout, breathe more frequently during speech, reduce throat clears   Person(s) Educated Patient   Methods Explanation;Demonstration   Comprehension Verbalized understanding;Returned demonstration          SLP Short Term Goals - 11/13/14 1400    SLP SHORT TERM GOAL #1   Title Pt will demonsrate sustained /a/ of 84dB with occassional min A  (12/08/14)   Time 4   Period Weeks   Status New   SLP SHORT TERM GOAL #2   Title Pt will maintain average of 72dB during structured speech tasks with occassional min A (12/08/14)   Time 4   Period Weeks   Status New  SLP SHORT TERM GOAL #3   Title Pt will report using throat clear alternatives over 4 sessions and demonstrate less than 4 throat clears in a session with occassional min A (12/08/14)   Time 4   Period Weeks   Status New          SLP Long Term Goals - Dec 09, 2014 1505    SLP LONG TERM GOAL #1   Title Maintain an average of 70dB during conversation over 12 minutes with rare min A (01/08/15)   Time 8   Period Weeks   Status New   SLP LONG TERM GOAL #2   Title Philis Nettle audilbly in a noisy environment for 10 minutes with rare min A (01/08/15)   Time 8   Period Weeks   Status New          Plan - 12/09/14 1357    Clinical Impression Statement Pt presents with moderate hypokinetic dysarthria and dysphonia. Sustained /a/  measured by sound level meter 30 cm  from pt's mouth was an average of 78dB. Conversational speech was an average of 68 dB , with 70 to 72dB being WNL. Pt was observed to throat clear frequently and harshly throughout the evaluation. I suspect this frequent throat clearing may be contributing to her hoarseness.  Mrs. Kowalewski reports her husband can't understand her and she has been avoiding talking to her children over the phone as they frequently state they can't hear her. Mrs. Rozell denies difficulty swallowing pills or during meals. I recommend skilled ST to maximize intelligibility.   Speech Therapy Frequency 2x / week   Duration --  8 weeks   Treatment/Interventions Compensatory strategies;Functional tasks;Patient/family education;Internal/external aids;SLP instruction and feedback   Potential to Achieve Goals Good   Potential Considerations Previous level of function   Consulted and Agree with Plan of Care Patient          G-Codes - 09-Dec-2014 1507    Functional Assessment Tool Used NOMS   Functional Limitations Motor speech   Motor Speech Current Status [id number]) At least 40 percent but less than 60 percent impaired, limited or restricted   Motor Speech Goal Status (U9323) At least 20 percent but less than 40 percent impaired, limited or restricted      Problem List Patient Active Problem List   Diagnosis Date Noted  . Paralysis agitans 09/19/2012  . Lymphoma 07/03/2011  . Malignant neoplasm of breast (female), unspecified site 07/03/2011  . Abnormal CT of the chest 03/13/2011  . HYPERTENSION, BENIGN 01/01/2009  . EDEMA 01/01/2009    Zulma Court, Annye Rusk, SLP 12/09/2014, 3:10 PM  [facility] [address] [place], [name], [address] Phone: [phone]   Fax:  [phone]

## 2014-11-13 NOTE — Patient Instructions (Addendum)
Sustained /a/ 2x a day - 10 minutes - big breath before each /a/  Think shout, you should feel like you are talking too loud, that's ok.  Try to reduce throat clears by doing a hard swallow or taking a sip of water with a hard swallow -

## 2014-11-16 DIAGNOSIS — L218 Other seborrheic dermatitis: Secondary | ICD-10-CM | POA: Diagnosis not present

## 2014-11-16 DIAGNOSIS — L821 Other seborrheic keratosis: Secondary | ICD-10-CM | POA: Diagnosis not present

## 2014-11-19 ENCOUNTER — Ambulatory Visit: Payer: Medicare Other | Admitting: Occupational Therapy

## 2014-11-19 ENCOUNTER — Encounter: Payer: Self-pay | Admitting: Occupational Therapy

## 2014-11-19 ENCOUNTER — Ambulatory Visit: Payer: Medicare Other

## 2014-11-19 DIAGNOSIS — R4189 Other symptoms and signs involving cognitive functions and awareness: Secondary | ICD-10-CM

## 2014-11-19 DIAGNOSIS — R490 Dysphonia: Secondary | ICD-10-CM

## 2014-11-19 DIAGNOSIS — R258 Other abnormal involuntary movements: Secondary | ICD-10-CM | POA: Diagnosis not present

## 2014-11-19 DIAGNOSIS — R29898 Other symptoms and signs involving the musculoskeletal system: Secondary | ICD-10-CM

## 2014-11-19 DIAGNOSIS — R269 Unspecified abnormalities of gait and mobility: Secondary | ICD-10-CM | POA: Diagnosis not present

## 2014-11-19 DIAGNOSIS — G20 Parkinson's disease: Secondary | ICD-10-CM

## 2014-11-19 DIAGNOSIS — R279 Unspecified lack of coordination: Secondary | ICD-10-CM | POA: Diagnosis not present

## 2014-11-19 NOTE — Patient Instructions (Signed)
  Please complete the assigned speech therapy homework prior to your next session.  

## 2014-11-19 NOTE — Therapy (Signed)
[name] [address] [place], [name], [address] Phone: [phone]   Fax:  [phone]  Occupational Therapy Evaluation  Patient Details  Name: [name] MRN: [id number] Date of Birth: Apr 26, 1928 Referring Provider:  [name], MD  Encounter Date: 11/19/2014      OT End of Session - 11/19/14 1220    Visit Number 1   Number of Visits 9   Date for OT Re-Evaluation 12/19/14   Authorization Type 1-Med[employer], 2-BCBS, G-code needed   Authorization Time Period cert period 7/85/88-11/26/75   Authorization - Visit Number 1   Authorization - Number of Visits 10   OT Start Time 1019   OT Stop Time 1101   OT Time Calculation (min) 42 min   Activity Tolerance Patient tolerated treatment well   Behavior During Therapy [facility] for tasks assessed/performed      Past Medical History  Diagnosis Date  . NHL (non-Hodgkin's lymphoma)     nhl dx 9/04 breast ca dx1/12  . Asthma   . Hearing loss   . Diabetes mellitus without complication     11/05/85.Marland KitchenMarland Kitchenpt denies  . GERD (gastroesophageal reflux disease)     Past Surgical History  Procedure Laterality Date  . Mastectomy  2 /8/ 12    bilateral  . Ba-ha ear implant      There were no vitals filed for this visit.  Visit Diagnosis:  Bradykinesia  Lack of coordination  Rigidity  Cognitive deficits      Subjective Assessment - 11/19/14 1023    Subjective  the medication makes me feel jittery in the morning, but after I take my lunchtime meds, I'm ok   Patient Stated Goals improve ADLs   Currently in Pain? No/denies           [facility] OT Assessment - 11/19/14 0001    Assessment   Diagnosis Parkinson's Disease    Onset Date --  dx 2014 per MD records, OT screen 08/23/14   Precautions   Precautions Fall   Balance Screen   Has the patient fallen in the past 6 months No   Home  Environment   Lives With Spouse   Prior Function   Level of  Independence Independent with homemaking with ambulation   Leisure Enjoys singing in choir; does walk at [employer] track intermittently   ADL   Eating/Feeding Modified independent  difficulty opening containers   Grooming Modified independent  incr time   Upper Body Bathing Modified independent  incr time   Lower Body Bathing Increased time   Upper Body Dressing --  min difficulty with bra   Lower Body Dressing Increased time   Toilet Tranfer Modified independent   [place] Transfer Modified independent   IADL   Shopping --  always goes with husband   Light Housekeeping Performs light daily tasks such as dishwashing, bed making  has help for heavier activities every other week   Meal Prep --  with increased time   [place] own vehicle  limited locally to a few locations   Medication Management Is responsible for taking medication in correct dosages at correct time   Prior Level of Function Financial Management husband has always performs   Mobility   Mobility Status Independent   Written Expression   Dominant Hand Right   Handwriting Mild micrographia;100% legible   Activity Tolerance   Activity Tolerance --  20-30 min of activity prior  to rest   Cognition   Overall Cognitive Status Cognition to be further assessed in functional context PRN   Memory Impaired  has missed appt   Memory Impairment Decreased short term memory  reports difficulty with dates/numbers, relies on calendar   Awareness Appears intact   Bradyphrenia Yes   Observation/Other Assessments   Standing Functional Reach Test R-9inches, L-11inches   Physical Performance Test   Yes   Simulated Eating Time (seconds) 10.62   Donning Doffing Jacket Time (seconds) 17.78   Coordination   9 Hole Peg Test Right;Left   Right 9 Hole Peg Test 28.82   Left 9 Hole Peg Test 25.72   Box and Blocks R-53blocks, L-50blocks   Tremors none noted   Tone    Assessment Location Right Upper Extremity;Left Upper Extremity   ROM / Strength   AROM / PROM / Strength AROM   AROM   Overall AROM  Within functional limits for tasks performed   Hand Function   Right Hand Grip (lbs) 46   Left Hand Grip (lbs) 43   RUE Tone   RUE Tone --  very mild   LUE Tone   LUE Tone --  very mild                         OT Education - 11/19/14 1215    Education provided Yes   Education Details How PD can affect appetite and things to discuss with MD re: PD and decr appetite, upcoming [employer] Parkinson's Meeting topic of Nutrition   Person(s) Educated Patient   Methods Explanation   Comprehension Verbalized understanding             OT Long Term Goals - 11/19/14 1234    OT LONG TERM GOAL #1   Title Pt will be independent with updated HEP.--Goals due 12/19/14   Time 4   Period Weeks   Status New   OT LONG TERM GOAL #2   Title Pt will improve R dominant hand coordination for ADLs as shown by improving 9-hole peg test score by at least 3sec.    Baseline R-28.82, L-25.72   Time 4   Period Weeks   Status New   OT LONG TERM GOAL #3   Title Pt will verbalize understanding of updated strategies for ADLs/IADLs to incr ease/decrease time required.   Time 4   Period Weeks   Status New   OT LONG TERM GOAL #4   Title Pt will improve balance/functional reaching for ADLs/IADLs as shown by improving RUE standing functional reach to at least 11 inches.   Baseline R-9, L-11 inches   Time 4   Period Weeks   Status New   OT LONG TERM GOAL #5   Title Pt will verbalize understanding of memory compensation strategies for ADLs prn.   Time 4   Period Weeks   Status New               Plan - 11/19/14 1228    Clinical Impression Statement Pt familiar to this therapist returns to occupational therapy with reports of increased difficulty with ADLs in the morning and increased time needed to complete ADLs.  Pt also reports memory  difficulty.  Pt presents with the following:  mild bradykinesia, mild rigidity, mild decr coordination, decreased balance/functional mobility for ADLs/IADLs, and cognitive deficits   Pt will benefit from skilled therapeutic intervention in order to improve on the following deficits (Retired) Decreased balance;Decreased  activity tolerance;Decreased coordination;Impaired tone;Decreased cognition;Decreased knowledge of use of DME;Decreased mobility;Impaired UE functional use  bradykinesia   Rehab Potential Good   OT Frequency 2x / week   OT Duration 4 weeks  +eval    OT Treatment/Interventions Self-care/ADL training;Cryotherapy;Ultrasound;DME and/or AE instruction;Manual Therapy;Passive range of motion;Therapeutic activities;Balance training;Patient/family education;Therapeutic exercises;Functional Mobility Training;Energy conservation;Moist Heat;Fluidtherapy;Neuromuscular education;Splinting;Cognitive remediation/compensation;Therapeutic exercise   Plan supine PWR! moves HEP in prep for ADLs in morning   Recommended Other Services receiving PT, ST   Consulted and Agree with Plan of Care Patient          G-Codes - 11-25-2014 1238    Functional Assessment Tool Used needs updated HEP/strategies for ADLs/IADLs, Standing functional reach test:  R-9inches, L-11inches   Functional Limitation Self care   Self Care Current Status (Q2229) At least 20 percent but less than 40 percent impaired, limited or restricted   Self Care Goal Status (N9892) At least 1 percent but less than 20 percent impaired, limited or restricted      Problem List Patient Active Problem List   Diagnosis Date Noted  . Paralysis agitans 09/19/2012  . Lymphoma 07/03/2011  . Malignant neoplasm of breast (female), unspecified site 07/03/2011  . Abnormal CT of the chest 03/13/2011  . HYPERTENSION, BENIGN 01/01/2009  . EDEMA 01/01/2009    [facility] 11/25/2014, 12:53 PM  [facility] [address] [place], [name], [address] Phone: [phone]   Fax:  [place], OTR/L 11-25-2014 12:53 PM

## 2014-11-19 NOTE — Therapy (Signed)
[facility] [address] [place], [name], [address] Phone: [phone]   Fax:  [phone]  Speech Language Pathology Treatment  Patient Details  Name: [name] MRN: [id number] Date of Birth: October 09, 1927 Referring Provider:  [name], MD  Encounter Date: 11/19/2014      End of Session - 11/19/14 1139    Visit Number 2   Number of Visits 16   Date for SLP Re-Evaluation 01/08/15   SLP Start Time 1103   SLP Stop Time  1145   SLP Time Calculation (min) 42 min   Activity Tolerance Patient tolerated treatment well      Past Medical History  Diagnosis Date  . NHL (non-Hodgkin's lymphoma)     nhl dx 9/04 breast ca dx1/12  . Asthma   . Hearing loss   . Diabetes mellitus without complication     0/35/59.Marland KitchenMarland Kitchenpt denies  . GERD (gastroesophageal reflux disease)     Past Surgical History  Procedure Laterality Date  . Mastectomy  2 /8/ 12    bilateral  . Ba-ha ear implant      There were no vitals filed for this visit.  Visit Diagnosis: Hypokinetic Parkinsonian dysphonia      Subjective Assessment - 11/19/14 1110    Subjective "Im feeling jittery again this morning."               ADULT SLP TREATMENT - 11/19/14 1110    General Information   Behavior/Cognition Alert;Cooperative;Pleasant mood   Treatment Provided   Treatment provided Cognitive-Linquistic   Pain Assessment   Pain Assessment No/denies pain   Cognitive-Linquistic Treatment   Treatment focused on Dysarthria   Skilled Treatment SLP facilitated pt's louder speech (70-72dB) with reading sentences and pt providing answers to those sentences (problems solving) with loudness 70-72dB average 85% of the time (min A req'd rarely, fading to SBA). Loud /a/ with rare min A  for production at top of breath pattern faded to SBA by 3rd of 5 reps. Sentence level responses - pt benefitted from SLP cues for louder speech when she expanded  response to short answer (min cues, occasionally).     Assessment / Recommendations / Plan   Plan Continue with current plan of care   Progression Toward Goals   Progression toward goals Progressing toward goals            SLP Short Term Goals - 11/19/14 1142    SLP SHORT TERM GOAL #1   Title Pt will demonsrate sustained /a/ of 84dB with occassional min A  (12/08/14)   Time 4   Period Weeks   Status On-going   SLP SHORT TERM GOAL #2   Title Pt will maintain average of 72dB during structured speech tasks with occassional min A (12/08/14)   Time 4   Period Weeks   Status On-going   SLP SHORT TERM GOAL #3   Title Pt will report using throat clear alternatives over 4 sessions and demonstrate less than 4 throat clears in a session with occassional min A (12/08/14)   Time 4   Period Weeks   Status On-going          SLP Long Term Goals - 11/19/14 1142    SLP LONG TERM GOAL #1   Title Maintain an average of 70dB during conversation over 12 minutes with rare min A (01/08/15)   Time 8   Period Weeks   Status On-going   SLP LONG TERM GOAL #2  Title [employer] in a noisy environment for 10 minutes with rare min A (01/08/15)   Time 8   Period Weeks   Status On-going          Plan - 11/19/14 1140    Clinical Impression Statement Pt without throat clears during session - states SLP during eval provided helpful info for reducing throat clearing. Slkilled ST remains needed for maximizing intelligiblity across speaking situations.   Speech Therapy Frequency 2x / week   Duration --  7 weeks   Treatment/Interventions Compensatory strategies;Functional tasks;Patient/family education;Internal/external aids;SLP instruction and feedback   Potential to Achieve Goals Good   Potential Considerations Previous level of function        Problem List Patient Active Problem List   Diagnosis Date Noted  . Paralysis agitans 09/19/2012  . Lymphoma 07/03/2011  . Malignant neoplasm of  breast (female), unspecified site 07/03/2011  . Abnormal CT of the chest 03/13/2011  . HYPERTENSION, BENIGN 01/01/2009  . EDEMA 01/01/2009    Garald Balding, SLP 11/19/2014, 11:43 AM  [facility] [address] [place], [name], [address] Phone: [phone]   Fax:  [phone]

## 2014-11-20 DIAGNOSIS — H95191 Other disorders following mastoidectomy, right ear: Secondary | ICD-10-CM | POA: Diagnosis not present

## 2014-11-20 DIAGNOSIS — H908 Mixed conductive and sensorineural hearing loss, unspecified: Secondary | ICD-10-CM | POA: Diagnosis not present

## 2014-11-22 ENCOUNTER — Other Ambulatory Visit (HOSPITAL_BASED_OUTPATIENT_CLINIC_OR_DEPARTMENT_OTHER): Payer: Medicare Other

## 2014-11-22 ENCOUNTER — Ambulatory Visit (HOSPITAL_BASED_OUTPATIENT_CLINIC_OR_DEPARTMENT_OTHER): Payer: Medicare Other | Admitting: Oncology

## 2014-11-22 ENCOUNTER — Telehealth: Payer: Self-pay | Admitting: Oncology

## 2014-11-22 VITALS — BP 136/64 | HR 81 | Temp 97.8°F | Resp 18 | Ht 63.0 in | Wt 113.0 lb

## 2014-11-22 DIAGNOSIS — D6481 Anemia due to antineoplastic chemotherapy: Secondary | ICD-10-CM | POA: Diagnosis not present

## 2014-11-22 DIAGNOSIS — Z79810 Long term (current) use of selective estrogen receptor modulators (SERMs): Secondary | ICD-10-CM

## 2014-11-22 DIAGNOSIS — C8590 Non-Hodgkin lymphoma, unspecified, unspecified site: Secondary | ICD-10-CM | POA: Diagnosis not present

## 2014-11-22 DIAGNOSIS — Z853 Personal history of malignant neoplasm of breast: Secondary | ICD-10-CM

## 2014-11-22 DIAGNOSIS — R630 Anorexia: Secondary | ICD-10-CM | POA: Diagnosis not present

## 2014-11-22 LAB — COMPREHENSIVE METABOLIC PANEL (CC13)
ALK PHOS: 76 U/L (ref 40–150)
ALT: 7 U/L (ref 0–55)
AST: 19 U/L (ref 5–34)
Albumin: 3.9 g/dL (ref 3.5–5.0)
Anion Gap: 13 mEq/L — ABNORMAL HIGH (ref 3–11)
BILIRUBIN TOTAL: 0.42 mg/dL (ref 0.20–1.20)
BUN: 13.9 mg/dL (ref 7.0–26.0)
CO2: 25 mEq/L (ref 22–29)
Calcium: 9.1 mg/dL (ref 8.4–10.4)
Chloride: 105 mEq/L (ref 98–109)
Creatinine: 1.2 mg/dL — ABNORMAL HIGH (ref 0.6–1.1)
EGFR: 42 mL/min/{1.73_m2} — ABNORMAL LOW (ref >90)
GLUCOSE: 119 mg/dL (ref 70–140)
Potassium: 4.2 mEq/L (ref 3.5–5.1)
Sodium: 143 mEq/L (ref 136–145)
Total Protein: 6 g/dL — ABNORMAL LOW (ref 6.4–8.3)

## 2014-11-22 LAB — CBC WITH DIFFERENTIAL/PLATELET
BASO%: 0.9 % (ref 0.0–2.0)
Basophils Absolute: 0 10*3/uL (ref 0.0–0.1)
EOS%: 1.7 % (ref 0.0–7.0)
Eosinophils Absolute: 0.1 10*3/uL (ref 0.0–0.5)
HCT: 34.7 % — ABNORMAL LOW (ref 34.8–46.6)
HGB: 11.7 g/dL (ref 11.6–15.9)
LYMPH#: 0.7 10*3/uL — AB (ref 0.9–3.3)
LYMPH%: 15.6 % (ref 14.0–49.7)
MCH: 30.1 pg (ref 25.1–34.0)
MCHC: 33.7 g/dL (ref 31.5–36.0)
MCV: 89.2 fL (ref 79.5–101.0)
MONO#: 0.3 10*3/uL (ref 0.1–0.9)
MONO%: 6.8 % (ref 0.0–14.0)
NEUT#: 3.5 10*3/uL (ref 1.5–6.5)
NEUT%: 75 % (ref 38.4–76.8)
NRBC: 0 % (ref 0–0)
Platelets: 122 10*3/uL — ABNORMAL LOW (ref 145–400)
RBC: 3.89 10*6/uL (ref 3.70–5.45)
RDW: 13.3 % (ref 11.2–14.5)
WBC: 4.7 10*3/uL (ref 3.9–10.3)

## 2014-11-22 NOTE — Telephone Encounter (Signed)
Pt confirmed labs/ov per 04/27 POF, gave AVS and Calendar..... [name]  °

## 2014-11-22 NOTE — Progress Notes (Signed)
  [facility] OFFICE PROGRESS NOTE   Diagnosis: Non-Hodgkin's lymphoma, breast cancer  INTERVAL HISTORY:   She returns as scheduled. No fever, dyspnea, change over the chest wall, or palpable lymph nodes. She complains of altered taste and anorexia. She is active physically.  Objective:  Vital signs in last 24 hours:  Blood pressure 136/64, pulse 81, temperature 97.8 F (36.6 C), temperature source Oral, resp. rate 18, height $RemoveBe'5\' 3"'JyRzGSzDo$  (1.6 m), weight 113 lb (51.256 kg), SpO2 100 %.    HEENT: Neck without mass Lymphatics: No cervical, supraclavicular, axillary, or inguinal nodes. Resp: Lungs clear bilaterally Cardio: Regular rate and rhythm GI: No hepatosplenomegaly, fullness in the left lower abdomen, Mass.? Vascular: No leg edema Breast: Status post bilateral mastectomy. No evidence for chest wall tumor recurrence.      Lab Results:  Lab Results  Component Value Date   WBC 4.7 11/22/2014   HGB 11.7 11/22/2014   HCT 34.7* 11/22/2014   MCV 89.2 11/22/2014   PLT 122* 11/22/2014   NEUTROABS 3.5 11/22/2014   calcium 9.1, creatinine 1.2   Medications: I have reviewed the patient's current medications.  Assessment/Plan: 1. Non-Hodgkin lymphoma treated with fludarabine/rituximab, last in July of 2009.  1. Restaging CT 11/24/2010 revealed evidence of progressive lymphoma in the abdomen and pelvis.  2. Initiation of salvage therapy with bendamustine/rituximab 12/04/2010. She completed 3 cycles.  3. Restaging CT 03/03/2011 revealed stable retroperitoneal soft tissue and a marked decrease in the soft tissue thickening associated with dilated loops of small bowel. 2. History of ITP. Intermittent mild thrombocytopenia persists. 3. History of Herpes zoster-maintained on prophylactic acyclovir. 4. Anemia secondary to non-Hodgkin lymphoma, chemotherapy, and a history of autoimmune hemolysis.  5. Hypertension. 6. Right-hand tremor/ataxia-followed by neurology, now  taking Sinemet, diagnosed with Parkinson's disease 7. Hearing loss-status post placement of an implanted hearing device by Dr. Cresenciano Lick. 8. Bilateral breast cancer, right breast cancer-grade 1, T1 N0, ER/PR positive, and HER2 negative. Left breast cancer, synchronous grade 2, T2 N0, ER/PR positive, and HER2 negative. She began adjuvant Arimidex on 09/15/2010. 9. Inflammatory changes of both lungs on a CT of the chest 11/24/2010-progressive on the restaging CT 03/03/2011, status post a bronchoscopy 03/17/2011 with no evidence of granulomata or tumor. 10. 11 mm spiculated lesion in the lingula on a CT 11/24/2010-less distinct on the CT 03/03/2011. 11. Upper respiratory infection while visiting her son in [place], June 2013, resolved after treatment with Levaquin/steroids 12. Upper respiratory infection,? Pneumonia summer 2014-chest x-ray 02/17/2013 with bilateral infiltrates  Disposition:  She continues adjuvant Arimidex for treatment of breast cancer. There is no clinical evidence for progression of breast cancer or non-Hodgkin's lymphoma. However she is losing weight and has developed anorexia. We decided to proceed with a restaging CT evaluation. She will return for an office visit in approximately 2 weeks.  Betsy Coder, MD  11/22/2014  11:55 AM

## 2014-11-23 ENCOUNTER — Ambulatory Visit: Payer: Medicare Other

## 2014-11-23 ENCOUNTER — Ambulatory Visit: Payer: Medicare Other | Admitting: Occupational Therapy

## 2014-11-23 DIAGNOSIS — R29898 Other symptoms and signs involving the musculoskeletal system: Secondary | ICD-10-CM

## 2014-11-23 DIAGNOSIS — R279 Unspecified lack of coordination: Secondary | ICD-10-CM

## 2014-11-23 DIAGNOSIS — R258 Other abnormal involuntary movements: Secondary | ICD-10-CM | POA: Diagnosis not present

## 2014-11-23 DIAGNOSIS — R4189 Other symptoms and signs involving cognitive functions and awareness: Secondary | ICD-10-CM | POA: Diagnosis not present

## 2014-11-23 DIAGNOSIS — R490 Dysphonia: Secondary | ICD-10-CM | POA: Diagnosis not present

## 2014-11-23 DIAGNOSIS — G20 Parkinson's disease: Secondary | ICD-10-CM

## 2014-11-23 DIAGNOSIS — R269 Unspecified abnormalities of gait and mobility: Secondary | ICD-10-CM | POA: Diagnosis not present

## 2014-11-23 NOTE — Patient Instructions (Signed)
  Please complete the assigned speech therapy homework and return it to your next session.  

## 2014-11-23 NOTE — Therapy (Signed)
[facility] [address] [place], [name], [address] Phone: [phone]   Fax:  [phone]  Occupational Therapy Treatment  Patient Details  Name: [name] MRN: [id number] Date of Birth: [birth year] Referring Provider:  [name], MD  Encounter Date: 11/23/2014      OT End of Session - 11/23/14 1030    Visit Number 2   Number of Visits 9   Date for OT Re-Evaluation 12/19/14   Authorization Type 1-[employer], 2-BCBS, G-code needed   Authorization Time Period cert period 0/17/51-0/25/85   Authorization - Visit Number 2   Authorization - Number of Visits 10   OT Start Time 1021   OT Stop Time 1100   OT Time Calculation (min) 39 min   Activity Tolerance Patient tolerated treatment well   Behavior During Therapy [facility] for tasks assessed/performed      Past Medical History  Diagnosis Date  . NHL (non-Hodgkin's lymphoma)     nhl dx 9/04 breast ca dx1/12  . Asthma   . Hearing loss   . Diabetes mellitus without complication     2/77/82.Marland KitchenMarland Kitchenpt denies  . GERD (gastroesophageal reflux disease)     Past Surgical History  Procedure Laterality Date  . Mastectomy  2 /8/ 12    bilateral  . Ba-ha ear implant      There were no vitals filed for this visit.  Visit Diagnosis:  Bradykinesia  Lack of coordination  Rigidity  Cognitive deficits      Subjective Assessment - 11/23/14 1022    Patient Stated Goals improve ADLs   Currently in Pain? No/denies   Multiple Pain Sites No                      OT Treatments/Exercises (OP) - 11/23/14 0001    Neurological Re-education Exercises   Reciprocal Movements 5 mins on arm bike level 1 min v.c for speed, pt maintained 35-40 RPM           PWR ([facility]) - 11/23/14 1333    PWR! exercises Moves in supine   PWR! Up 10   PWR! Rock 20   PWR! Twist 20   PWR! Step 20   Comments mod v.c./ demonstration     Education provided regarding  handwriting strategies. Pt writes with increased size and legibility with smaller pen grip or medium sized pen, min v.c. For techniques. Handout provided and reviewed regarding handwriting strategies.         OT Education - 11/23/14 1335    Education provided Yes   Education Details PWR!supine   Person(s) Educated Patient   Methods Explanation;Demonstration;Verbal cues;Handout;Tactile cues   Comprehension Verbalized understanding;Returned demonstration;Need further instruction;Verbal cues required  needs reinforcement             OT Long Term Goals - 11/19/14 1234    OT LONG TERM GOAL #1   Title Pt will be independent with updated HEP.--Goals due 12/19/14   Time 4   Period Weeks   Status New   OT LONG TERM GOAL #2   Title Pt will improve R dominant hand coordination for ADLs as shown by improving 9-hole peg test score by at least 3sec.    Baseline R-28.82, L-25.72   Time 4   Period Weeks   Status New   OT LONG TERM GOAL #3   Title Pt will verbalize understanding of updated strategies for ADLs/IADLs to incr ease/decrease time required.   Time 4  Period Weeks   Status New   OT LONG TERM GOAL #4   Title Pt will improve balance/functional reaching for ADLs/IADLs as shown by improving RUE standing functional reach to at least 11 inches.   Baseline R-9, L-11 inches   Time 4   Period Weeks   Status New   OT LONG TERM GOAL #5   Title Pt will verbalize understanding of memory compensation strategies for ADLs prn.   Time 4   Period Weeks   Status New               Problem List Patient Active Problem List   Diagnosis Date Noted  . Paralysis agitans 09/19/2012  . Lymphoma 07/03/2011  . Malignant neoplasm of breast (female), unspecified site 07/03/2011  . Abnormal CT of the chest 03/13/2011  . HYPERTENSION, BENIGN 01/01/2009  . EDEMA 01/01/2009    RINE,KATHRYN 11/23/2014, 1:36 PM Theone Murdoch, OTR/L [phone] Phone: [phone] 1:36 PM  11/23/2014 [facility] [address] [place], [name], [address] Phone: [phone]   Fax:  [phone]

## 2014-11-23 NOTE — Therapy (Signed)
[facility] [address] [place], [name], [address] Phone: [phone]   Fax:  [phone]  Speech Language Pathology Treatment  Patient Details  Name: [name] MRN: [id number] Date of Birth: 1928-03-19 Referring Provider:  [name], MD  Encounter Date: 11/23/2014      End of Session - 11/23/14 1215    Visit Number 3   Number of Visits 16   Date for SLP Re-Evaluation 01/08/15   SLP Start Time 33   SLP Stop Time  1150   SLP Time Calculation (min) 45 min   Activity Tolerance Patient tolerated treatment well      Past Medical History  Diagnosis Date  . NHL (non-Hodgkin's lymphoma)     nhl dx 9/04 breast ca dx1/12  . Asthma   . Hearing loss   . Diabetes mellitus without complication     4/49/67.Marland KitchenMarland Kitchenpt denies  . GERD (gastroesophageal reflux disease)     Past Surgical History  Procedure Laterality Date  . Mastectomy  2 /8/ 12    bilateral  . Ba-ha ear implant      There were no vitals filed for this visit.  Visit Diagnosis: Hypokinetic Parkinsonian dysphonia      Subjective Assessment - 11/23/14 1130    Subjective Pt reports feeling "jittery" at this time of the morning, consistently until noontime dose.               ADULT SLP TREATMENT - 11/23/14 1122    General Information   Behavior/Cognition Alert;Cooperative;Confused   Treatment Provided   Treatment provided Cognitive-Linquistic   Pain Assessment   Pain Assessment No/denies pain   Cognitive-Linquistic Treatment   Treatment focused on Dysarthria   Skilled Treatment Pt with extensive difficulty with loud /a/; quality, volume, and pitch all very difficult for pt to achieve. SLP attempted to reduce severity of pt's distraction by asking her what was going on and pt unwilling to talk.  Pt continually clearing throat throughout session, even after SLP encouraged pt to take sips with hard swallows. SLP facilitated practice  with louder speech in sentence responses. Pt req'd cues for task directions rarely, and cues for thinking about the meaning of expressions (e.g., "dont judge a book by its cover"). Average 70dB, with mild hoarseness. SLP strongly encouraged pt to keep H2O near her the remainder of the day and cease throat clearing.      [place] [employer] Scales of Cognitive Functioning --   Assessment / Recommendations / Plan   Plan Continue with current plan of care   Progression Toward Goals   Progression toward goals Progressing toward goals          SLP Education - 11/23/14 1215    Education provided Yes   Education Details Throat clearing and it's negative affect on vocal quality, need for H2O instead   Person(s) Educated Patient   Methods Explanation   Comprehension Verbalized understanding;Verbal cues required          SLP Short Term Goals - 11/23/14 1218    SLP SHORT TERM GOAL #1   Title Pt will demonsrate sustained /a/ of 84dB with occassional min A  (12/08/14)   Time 4   Period Weeks   Status On-going   SLP SHORT TERM GOAL #2   Title Pt will maintain average of 72dB during structured speech tasks with occassional min A (12/08/14)   Time 4   Period Weeks   Status On-going   SLP SHORT TERM GOAL #  3   Title Pt will report using throat clear alternatives over 4 sessions and demonstrate less than 4 throat clears in a session with occassional min A (12/08/14)   Time 4   Period Weeks   Status On-going          SLP Long Term Goals - 11/23/14 1218    SLP LONG TERM GOAL #1   Title Maintain an average of 70dB during conversation over 12 minutes with rare min A (01/08/15)   Time 8   Period Weeks   Status On-going   SLP LONG TERM GOAL #2   Title Philis Nettle audilbly in a noisy environment for 10 minutes with rare min A (01/08/15)   Time 8   Period Weeks   Status On-going          Plan - 11/23/14 1216    Clinical Impression Statement Pt continually cleared throat during sesion - SLP  continually encouragd pt to sip water and provided gestural cues each time pt throat cleared with minimal success. Pt with much difficulty with loud /a/, stating she was both exhausted from previous OT session, and "jittery".   Speech Therapy Frequency 2x / week   Duration --  7 weeks   Treatment/Interventions Compensatory strategies;Functional tasks;Patient/family education;Internal/external aids;SLP instruction and feedback   Potential to Achieve Goals Good   Potential Considerations Previous level of function        Problem List Patient Active Problem List   Diagnosis Date Noted  . Paralysis agitans 09/19/2012  . Lymphoma 07/03/2011  . Malignant neoplasm of breast (female), unspecified site 07/03/2011  . Abnormal CT of the chest 03/13/2011  . HYPERTENSION, BENIGN 01/01/2009  . EDEMA 01/01/2009    Garald Balding, SLP 11/23/2014, 12:19 PM  [facility] [address] [place], [name], [address] Phone: [phone]   Fax:  [phone]

## 2014-11-27 ENCOUNTER — Ambulatory Visit: Payer: Medicare Other

## 2014-11-27 ENCOUNTER — Ambulatory Visit: Payer: Medicare Other | Attending: Neurology | Admitting: Physical Therapy

## 2014-11-27 ENCOUNTER — Ambulatory Visit: Payer: Medicare Other | Admitting: Occupational Therapy

## 2014-11-27 DIAGNOSIS — R490 Dysphonia: Secondary | ICD-10-CM

## 2014-11-27 DIAGNOSIS — R279 Unspecified lack of coordination: Secondary | ICD-10-CM

## 2014-11-27 DIAGNOSIS — R269 Unspecified abnormalities of gait and mobility: Secondary | ICD-10-CM

## 2014-11-27 DIAGNOSIS — R29898 Other symptoms and signs involving the musculoskeletal system: Secondary | ICD-10-CM | POA: Insufficient documentation

## 2014-11-27 DIAGNOSIS — R258 Other abnormal involuntary movements: Secondary | ICD-10-CM | POA: Insufficient documentation

## 2014-11-27 DIAGNOSIS — R4189 Other symptoms and signs involving cognitive functions and awareness: Secondary | ICD-10-CM | POA: Insufficient documentation

## 2014-11-27 NOTE — Therapy (Signed)
[name] [address] [place], [name], [address] Phone: [phone]   Fax:  [phone]  Occupational Therapy Treatment  Patient Details  Name: [name] MRN: [id number] Date of Birth: Jun 26, 1928 Referring Provider:  [name], MD  Encounter Date: 11/27/2014      OT End of Session - 11/27/14 0942    Visit Number 3   Number of Visits 9   Date for OT Re-Evaluation 12/19/14   Authorization Type 1-Med[employer], 2-BCBS, G-code needed   Authorization Time Period cert period 2/40/97-3/53/29   OT Start Time 0936   OT Stop Time 1012   OT Time Calculation (min) 36 min   Activity Tolerance Patient tolerated treatment well   Behavior During Therapy [facility] for tasks assessed/performed      Past Medical History  Diagnosis Date  . NHL (non-Hodgkin's lymphoma)     nhl dx 9/04 breast ca dx1/12  . Asthma   . Hearing loss   . Diabetes mellitus without complication     04/19/25.Marland KitchenMarland Kitchenpt denies  . GERD (gastroesophageal reflux disease)     Past Surgical History  Procedure Laterality Date  . Mastectomy  2 /8/ 12    bilateral  . Ba-ha ear implant      There were no vitals filed for this visit.  Visit Diagnosis:  Rigidity  Lack of coordination  Bradykinesia  Cognitive deficits                       PWR [facility]) - 11/27/14 1000    PWR! exercises Moves in supine   PWR! Up 20   PWR! Rock 20   PWR! Twist 20   PWR! Step 20   Comments min-mod v.c      Self care: Bag exercises for simulated ADLs, min v.c./ demonstration( crumple in hand x 4, behind head, behind back , pass in front, and over head x 20 reps each)  to simulate bathing and dressing activities.            OT Long Term Goals - 11/19/14 1234    OT LONG TERM GOAL #1   Title Pt will be independent with updated HEP.--Goals due 12/19/14   Time 4   Period Weeks   Status New   OT LONG TERM GOAL #2   Title Pt will  improve R dominant hand coordination for ADLs as shown by improving 9-hole peg test score by at least 3sec.    Baseline R-28.82, L-25.72   Time 4   Period Weeks   Status New   OT LONG TERM GOAL #3   Title Pt will verbalize understanding of updated strategies for ADLs/IADLs to incr ease/decrease time required.   Time 4   Period Weeks   Status New   OT LONG TERM GOAL #4   Title Pt will improve balance/functional reaching for ADLs/IADLs as shown by improving RUE standing functional reach to at least 11 inches.   Baseline R-9, L-11 inches   Time 4   Period Weeks   Status New   OT LONG TERM GOAL #5   Title Pt will verbalize understanding of memory compensation strategies for ADLs prn.   Time 4   Period Weeks   Status New               Plan - 11/27/14 1019    Clinical Impression Statement Pt is progressing towards goals, she requires reinforcement for large amplitude movements.   Plan big movements  with ADLS   OT Home Exercise Plan issued: PWR! supine 11/23/14, hand writing handout 11/23/14   Consulted and Agree with Plan of Care Patient        Problem List Patient Active Problem List   Diagnosis Date Noted  . Paralysis agitans 09/19/2012  . Lymphoma 07/03/2011  . Malignant neoplasm of breast (female), unspecified site 07/03/2011  . Abnormal CT of the chest 03/13/2011  . HYPERTENSION, BENIGN 01/01/2009  . EDEMA 01/01/2009    RINE,KATHRYN 11/27/2014, 10:21 AM Theone Murdoch, OTR/L [phone] Phone: [phone] 10:21 AM 11/27/2014 [facility] [address] [place], [name], [address] Phone: [phone]   Fax:  [phone]

## 2014-11-27 NOTE — Therapy (Signed)
[facility] [address] [place], [name], [address] Phone: [phone]   Fax:  [phone]  Physical Therapy Treatment  Patient Details  Name: [name] MRN: [id number] Date of Birth: [birth year] Referring Provider:  [name], MD  Encounter Date: 11/27/2014      PT End of Session - 11/27/14 1311    Visit Number 3   Number of Visits 9   Date for PT Re-Evaluation 01/08/15   PT Start Time 1125   PT Stop Time 1155   PT Time Calculation (min) 30 min   Activity Tolerance Patient limited by fatigue;Treatment limited secondary to medical complications (Comment)      Past Medical History  Diagnosis Date  . NHL (non-Hodgkin's lymphoma)     nhl dx 9/04 breast ca dx1/12  . Asthma   . Hearing loss   . Diabetes mellitus without complication     2/69/48.Marland KitchenMarland Kitchenpt denies  . GERD (gastroesophageal reflux disease)     Past Surgical History  Procedure Laterality Date  . Mastectomy  2 /8/ 12    bilateral  . Ba-ha ear implant      There were no vitals filed for this visit.  Visit Diagnosis:  Abnormality of gait      Subjective Assessment - 11/27/14 1309    Subjective Pt reports feeling anxious, jittery and woozy.  Feels like she did too much in OT earlier.  Husband thinks it is because she did not rest at all today and is having financial/family turmoil.   Patient Stated Goals Pt's goals for therapy are to walk as I should and move as well as I can.   Currently in Pain? No/denies     Pt reports not feeling well upon finding pt sitting with husband in lobby.  Reports feeling like she "over did it" during OT session but felt fine at the time and during ST session as well.  Reports she just took an anxiety pill.  BP sitting 198/81 HR 65 sitting BP sitting 3 minutes later 171/83 HR 70 BP standing for 1 minute 157/77 HR 73  Pt denies headache.  Discussed BP's with patient and husband.  Pt declined calling 911.   Spouse feels like pt is overly fatigued from previous day of "emotional turmoil" and did not rest at all. Spouse desires to take pt home to rest and agrees to call MD or 911 if pt's status changes. Above discussed with Mady Haagensen, PT.       PT Education - 11/27/14 1310    Education provided Yes   Education Details Call 911 if symptoms worsen vs calling primary MD if pt continues to feel weak, BP's taken today   Person(s) Educated Patient;Spouse   Methods Explanation   Comprehension Verbalized understanding             PT Long Term Goals - 11/09/14 1208    PT LONG TERM GOAL #1   Title Pt will perform HEP with family supervision for improved balance, strength and gait. (4 wk [place] 12/08/14)   Time 4   Period Weeks   Status New   PT LONG TERM GOAL #2   Title Pt will improve Functional Gait Assessment to at least 20/30 for decreased fall risk.   Time 4   Period Weeks   Status New   PT LONG TERM GOAL #3   Title Pt will improve gait velocity backwards direction to at least 1 ft/sec for improved balance recovery in posterior direction.  Time 4   Period Weeks   Status New   PT LONG TERM GOAL #4   Title Pt will verbalize plans for continued community fitness upon D/C from PT.   Time 4   Period Weeks   Status New               Plan - 11/27/14 1312    Clinical Impression Statement Pt not feeling well today and c/o fatigue, wooziness, jittery.  Husband gave her an anxiety pill after ST treatment but did not seem to help per pt.  Physical treatment not performed today secondary to pt not feeling well and varying BP's.  Continue PT per POC as pt tolerates.   Pt will benefit from skilled therapeutic intervention in order to improve on the following deficits Abnormal gait;Decreased balance;Decreased mobility;Decreased strength;Postural dysfunction   Rehab Potential Good   PT Frequency 2x / week   PT Duration 4 weeks   PT Treatment/Interventions ADLs/Self Care Home  Management;Gait training;Stair training;Functional mobility training;Therapeutic activities;Neuromuscular re-education;Balance training;Therapeutic exercise;Patient/family education   PT Next Visit Plan Review ankle stregnthening and sit<>stand as HEP given at eval; Initiate additions to HEP   Consulted and Agree with Plan of Care Patient;Family member/caregiver   Family Member Consulted spouse        Problem List Patient Active Problem List   Diagnosis Date Noted  . Paralysis agitans 09/19/2012  . Lymphoma 07/03/2011  . Malignant neoplasm of breast (female), unspecified site 07/03/2011  . Abnormal CT of the chest 03/13/2011  . HYPERTENSION, BENIGN 01/01/2009  . EDEMA 01/01/2009    Narda Bonds 11/27/2014, 1:14 PM  [facility] [address] [place], [name], [address] Phone: [phone]   Fax:  [place] 11/27/2014 1:14 PM Phone: [phone] Fax: [phone]

## 2014-11-27 NOTE — Patient Instructions (Signed)
  Please complete the assigned speech therapy homework and return it to your next session.  

## 2014-11-27 NOTE — Therapy (Signed)
[facility] [address] [place], [name], [address] Phone: [phone]   Fax:  [phone]  Speech Language Pathology Treatment  Patient Details  Name: [name] MRN: [id number] Date of Birth: 11/19/1927 Referring Provider:  [name], MD  Encounter Date: 11/27/2014      End of Session - 11/27/14 1042    Visit Number 4   Number of Visits 16   Date for SLP Re-Evaluation 01/08/15   SLP Start Time 41   SLP Stop Time  1100   SLP Time Calculation (min) 40 min   Activity Tolerance Patient limited by fatigue  with approx 5 minutes left in session      Past Medical History  Diagnosis Date  . NHL (non-Hodgkin's lymphoma)     nhl dx 9/04 breast ca dx1/12  . Asthma   . Hearing loss   . Diabetes mellitus without complication     9/38/10.Marland KitchenMarland Kitchenpt denies  . GERD (gastroesophageal reflux disease)     Past Surgical History  Procedure Laterality Date  . Mastectomy  2 /8/ 12    bilateral  . Ba-ha ear implant      There were no vitals filed for this visit.  Visit Diagnosis: Hypokinetic Parkinsonian dysphonia      Subjective Assessment - 11/27/14 1024    Subjective Same feeling of "jittery" as previous session.               ADULT SLP TREATMENT - 11/27/14 1024    General Information   Behavior/Cognition Alert;Cooperative;Confused   Treatment Provided   Treatment provided Cognitive-Linquistic   Pain Assessment   Pain Assessment No/denies pain   Cognitive-Linquistic Treatment   Treatment focused on Dysarthria   Skilled Treatment Pt vocalized loud "Hey!" as SLP attempting to avoid same difficulty as last session with loud /a/. Average 85dB with mildly hoarse voice. Pt produced sentences regarding pictured sequences for 14 minutes with average 72dB and occasional min A. Pt with hard throat clearing throughout session - SLP reminded pt to take a drink of water with hard swallow x2-3 if necessary,  instead of sing hard throat clears. Pt frequency of throat clearing decreased, and cues were faded from usual to rare cues to use water. Conversation of simple-mod complex nature with average 70dB over 7 minutes. As patient fatiged after 7 minutes loudness decr'd.    Assessment / Recommendations / Plan   Plan Continue with current plan of care   Progression Toward Goals   Progression toward goals Progressing toward goals          SLP Education - 11/27/14 1042    Education provided Yes   Education Details sips H2O with hard swallows instead of hard throat clearing   Person(s) Educated Patient   Methods Explanation;Demonstration   Comprehension Verbalized understanding;Verbal cues required          SLP Short Term Goals - 11/27/14 1043    SLP SHORT TERM GOAL #1   Title Pt will demonsrate sustained /a/ of 84dB with occassional min A  (12/08/14)   Time 3   Period Weeks   Status On-going   SLP SHORT TERM GOAL #2   Title Pt will maintain average of 72dB during structured speech tasks with occassional min A over 3 sessions (12/08/14)  modified due to consistency   Time 3   Period Weeks   Status Revised   SLP SHORT TERM GOAL #3   Title Pt will report using throat clear alternatives over 4  sessions and demonstrate less than 4 throat clears in a session with occassional min A (12/08/14)   Time 3   Period Weeks   Status On-going          SLP Long Term Goals - 11/27/14 1046    SLP LONG TERM GOAL #1   Title Maintain an average of 70dB during conversation over 12 minutes with rare min A over two sessions (01/08/15)  revised due to consistency   Time 7   Period Weeks   Status On-going   SLP LONG TERM GOAL #2   Title Philis Nettle audilbly in a noisy environment for 10 minutes with rare min A (01/08/15)   Time 7   Period Weeks   Status On-going          Plan - 11/27/14 1101    Clinical Impression Statement Pt with fatigue noted with approx 5 minutes left in session.    Speech  Therapy Frequency 2x / week   Duration --  7 weeks   Treatment/Interventions Compensatory strategies;Functional tasks;Patient/family education;Internal/external aids;SLP instruction and feedback   Potential to Achieve Goals Good   Potential Considerations Previous level of function        Problem List Patient Active Problem List   Diagnosis Date Noted  . Paralysis agitans 09/19/2012  . Lymphoma 07/03/2011  . Malignant neoplasm of breast (female), unspecified site 07/03/2011  . Abnormal CT of the chest 03/13/2011  . HYPERTENSION, BENIGN 01/01/2009  . EDEMA 01/01/2009    Garald Balding, SLP 11/27/2014, 11:09 AM  [facility] [address] [place], [name], [address] Phone: [phone]   Fax:  [phone]

## 2014-11-28 ENCOUNTER — Telehealth: Payer: Self-pay | Admitting: Neurology

## 2014-11-28 ENCOUNTER — Ambulatory Visit: Payer: Medicare Other

## 2014-11-28 ENCOUNTER — Ambulatory Visit: Payer: Medicare Other | Admitting: Occupational Therapy

## 2014-11-28 ENCOUNTER — Ambulatory Visit: Payer: Medicare Other | Admitting: Physical Therapy

## 2014-11-28 VITALS — BP 178/84

## 2014-11-28 VITALS — BP 168/69

## 2014-11-28 VITALS — BP 143/68

## 2014-11-28 DIAGNOSIS — R4189 Other symptoms and signs involving cognitive functions and awareness: Secondary | ICD-10-CM

## 2014-11-28 DIAGNOSIS — R29898 Other symptoms and signs involving the musculoskeletal system: Secondary | ICD-10-CM | POA: Diagnosis not present

## 2014-11-28 DIAGNOSIS — R279 Unspecified lack of coordination: Secondary | ICD-10-CM

## 2014-11-28 DIAGNOSIS — R269 Unspecified abnormalities of gait and mobility: Secondary | ICD-10-CM | POA: Diagnosis not present

## 2014-11-28 DIAGNOSIS — R258 Other abnormal involuntary movements: Secondary | ICD-10-CM | POA: Diagnosis not present

## 2014-11-28 DIAGNOSIS — R490 Dysphonia: Secondary | ICD-10-CM

## 2014-11-28 NOTE — Telephone Encounter (Signed)
Pt husband wants to talk to you about moving patient appt up please call [name] at [phone]

## 2014-11-28 NOTE — Therapy (Signed)
[facility] [address] [place], [name], [address] Phone: [phone]   Fax:  [phone]  Speech Language Pathology Treatment  Patient Details  Name: [name] MRN: [id number] Date of Birth: 04-13-1928 Referring Provider:  [name], MD  Encounter Date: 11/28/2014      End of Session - 11/28/14 1053    Visit Number 5   Number of Visits 16   Date for SLP Re-Evaluation 01/08/15   SLP Start Time 74   SLP Stop Time  1100   SLP Time Calculation (min) 40 min   Activity Tolerance Patient limited by fatigue  "jittery" feeling intermittently after 10:45am, where pt demonstrated heavier breathing      Past Medical History  Diagnosis Date  . NHL (non-Hodgkin's lymphoma)     nhl dx 9/04 breast ca dx1/12  . Asthma   . Hearing loss   . Diabetes mellitus without complication     09/18/95.Marland KitchenMarland Kitchenpt denies  . GERD (gastroesophageal reflux disease)     Past Surgical History  Procedure Laterality Date  . Mastectomy  2 /8/ 12    bilateral  . Ba-ha ear implant      Filed Vitals:   11/28/14 1052 11/28/14 1102  BP: 170/79 168/69    Visit Diagnosis: Hypokinetic Parkinsonian dysphonia      Subjective Assessment - 11/28/14 1025    Subjective Pt entered ST room clearing throat. Reports feeling jittery again this morning.               ADULT SLP TREATMENT - 11/28/14 1025    General Information   Behavior/Cognition Alert;Cooperative;Confused   Treatment Provided   Treatment provided Cognitive-Linquistic   Pain Assessment   Pain Assessment No/denies pain   Cognitive-Linquistic Treatment   Treatment focused on Dysarthria   Skilled Treatment Loud "Hey!" with average 84dB and good vocal quality. Short 4-5 minute conversational topics (simple to mod complex) today with average 70dB    Assessment / Recommendations / Plan   Plan Continue with current plan of care   Progression Toward Goals   Progression toward goals Progressing toward goals          SLP Education - 11/27/14 1042    Education provided Yes   Education Details sips H2O with hard swallows instead of hard throat clearing   Person(s) Educated Patient   Methods Explanation;Demonstration   Comprehension Verbalized understanding;Verbal cues required          SLP Short Term Goals - 11/28/14 1056    SLP SHORT TERM GOAL #1   Title Pt will demonsrate sustained /a/ of 84dB with occassional min A  (12/08/14)   Time 3   Period Weeks   Status On-going   SLP SHORT TERM GOAL #2   Title Pt will maintain average of 72dB during structured speech tasks with occassional min A over 3 sessions (12/08/14)  modified due to consistency   Time 3   Period Weeks   Status Revised   SLP SHORT TERM GOAL #3   Title Pt will report using throat clear alternatives over 4 sessions and demonstrate less than 4 throat clears in a session with occassional min A (12/08/14)   Time 3   Period Weeks   Status On-going          SLP Long Term Goals - 11/28/14 1056    SLP LONG TERM GOAL #1   Title Maintain an average of 70dB during conversation over 12 minutes with rare min A over  two sessions (01/08/15)  revised due to consistency   Time 7   Period Weeks   Status On-going   SLP LONG TERM GOAL #2   Title Philis Nettle audilbly in a noisy environment for 10 minutes with rare min A (01/08/15)   Time 7   Period Weeks   Status On-going          Plan - 11/28/14 1056    Clinical Impression Statement Pt with fatigue noted with approx 15 minutes left in session.    Speech Therapy Frequency 2x / week   Duration --  7 weeks   Treatment/Interventions Compensatory strategies;Functional tasks;Patient/family education;Internal/external aids;SLP instruction and feedback   Potential to Achieve Goals Good   Potential Considerations Previous level of function        Problem List Patient Active Problem List   Diagnosis Date Noted  . Paralysis  agitans 09/19/2012  . Lymphoma 07/03/2011  . Malignant neoplasm of breast (female), unspecified site 07/03/2011  . Abnormal CT of the chest 03/13/2011  . HYPERTENSION, BENIGN 01/01/2009  . EDEMA 01/01/2009    Garald Balding , SLP  11/28/2014, 11:02 AM  [facility] [address] [place], [name], [address] Phone: [phone]   Fax:  [phone]

## 2014-11-28 NOTE — Patient Instructions (Signed)

## 2014-11-28 NOTE — Therapy (Signed)
[facility] [address] [place], [name], [address] Phone: [phone]   Fax:  [phone]  Occupational Therapy Treatment  Patient Details  Name: [name] MRN: [id number] Date of Birth: [birth year] Referring Provider:  [name], MD  Encounter Date: 11/28/2014      OT End of Session - 11/28/14 1056    Visit Number 4   Number of Visits 9   Authorization Time Period cert period 3/82/50-5/39/76   Authorization - Visit Number 4   Authorization - Number of Visits 10   OT Start Time 0935   OT Stop Time 1015   OT Time Calculation (min) 40 min      Past Medical History  Diagnosis Date  . NHL (non-Hodgkin's lymphoma)     nhl dx 9/04 breast ca dx1/12  . Asthma   . Hearing loss   . Diabetes mellitus without complication     7/34/19.Marland KitchenMarland Kitchenpt denies  . GERD (gastroesophageal reflux disease)     Past Surgical History  Procedure Laterality Date  . Mastectomy  2 /8/ 12    bilateral  . Ba-ha ear implant      Filed Vitals:   11/28/14 0947  BP: 143/68    Visit Diagnosis:  Lack of coordination  Bradykinesia  Rigidity  Cognitive deficits      Subjective Assessment - 11/28/14 0951    Subjective  Pt reports exercises made her very tired yesterday   Patient Stated Goals improve ADLs   Currently in Pain? No/denies                      OT Treatments/Exercises (OP) - 11/28/14 0001    ADLs   Overall ADLs Education provided regarding big movements with ADLS, handout provided.   ADL Comments  exercises for simulated ADLS, behind head, pass behind back and under each foot, x 10 reps each.   Fine Motor Coordination   Fine Motor Coordination Flipping cards;Dealing card with thumb;Picking up coins;Manipulating coins;Stacking coins   Flipping cards min v.c., bilateral UE's increased difficulty with RUE   Dealing card with thumb min v.c.bilateral UE's increased difficulty with RUE   Picking  up coins min v.c.bilateral UE's   Stacking coins min v.c. bilateral UE's   Other Fine Motor Exercises rotating ball in fingertips with bilateral UE's min v.c.                OT Education - 11/28/14 1338    Education provided Yes   Education Details Big movements with ADLS   Person(s) Educated Patient   Methods Explanation;Demonstration;Verbal cues;Handout   Comprehension Verbalized understanding             OT Long Term Goals - 11/19/14 1234    OT LONG TERM GOAL #1   Title Pt will be independent with updated HEP.--Goals due 12/19/14   Time 4   Period Weeks   Status New   OT LONG TERM GOAL #2   Title Pt will improve R dominant hand coordination for ADLs as shown by improving 9-hole peg test score by at least 3sec.    Baseline R-28.82, L-25.72   Time 4   Period Weeks   Status New   OT LONG TERM GOAL #3   Title Pt will verbalize understanding of updated strategies for ADLs/IADLs to incr ease/decrease time required.   Time 4   Period Weeks   Status New   OT LONG TERM GOAL #4   Title Pt will  improve balance/functional reaching for ADLs/IADLs as shown by improving RUE standing functional reach to at least 11 inches.   Baseline R-9, L-11 inches   Time 4   Period Weeks   Status New   OT LONG TERM GOAL #5   Title Pt will verbalize understanding of memory compensation strategies for ADLs prn.   Time 4   Period Weeks   Status New               Plan - 11/28/14 7262    Clinical Impression Statement Pt is progressing towards goals yet is limited by decreased activity tolerance.   OT Frequency 2x / week   OT Duration 4 weeks   OT Treatment/Interventions Self-care/ADL training;Cryotherapy;Ultrasound;DME and/or AE instruction;Manual Therapy;Passive range of motion;Therapeutic activities;Balance training;Patient/family education;Therapeutic exercises;Functional Mobility Training;Energy conservation;Moist Heat;Fluidtherapy;Neuromuscular education;Splinting;Cognitive  remediation/compensation;Therapeutic exercise   Plan monitor BP/ mediacl issues,reinforce PWR! in supine   OT Home Exercise Plan issued: PWR! supine 11/23/14, hand writing handout 11/23/14, big movements with ADLS ahndout- 0/3/55   Consulted and Agree with Plan of Care Patient        Problem List Patient Active Problem List   Diagnosis Date Noted  . Paralysis agitans 09/19/2012  . Lymphoma 07/03/2011  . Malignant neoplasm of breast (female), unspecified site 07/03/2011  . Abnormal CT of the chest 03/13/2011  . HYPERTENSION, BENIGN 01/01/2009  . EDEMA 01/01/2009    Anjelika Ausburn 11/28/2014, 1:38 PM Theone Murdoch, OTR/L [phone] Phone: [phone] 1:38 PM 11/28/2014 [facility] [address] [place], [name], [address] Phone: [phone]   Fax:  [phone]

## 2014-11-28 NOTE — Telephone Encounter (Signed)
Spoke with Mr. Cush. He states that patient is having a hard time in the mornings feeling "jittery". She will take her medication when she wakes up but it takes her all morning to get rid of the "jittery" feeling. I offered to send this message to Dr Tat to adjust patient's medications but they wanted appt moved sooner to discuss in person. Appt moved.

## 2014-11-29 ENCOUNTER — Ambulatory Visit (HOSPITAL_COMMUNITY)
Admission: RE | Admit: 2014-11-29 | Discharge: 2014-11-29 | Disposition: A | Discharge status: Home / Self Care | Payer: Medicare Other | Source: Ambulatory Visit | Attending: Oncology | Admitting: Oncology

## 2014-11-29 ENCOUNTER — Encounter (HOSPITAL_COMMUNITY): Payer: Self-pay

## 2014-11-29 DIAGNOSIS — N858 Other specified noninflammatory disorders of uterus: Secondary | ICD-10-CM | POA: Diagnosis not present

## 2014-11-29 DIAGNOSIS — R634 Abnormal weight loss: Secondary | ICD-10-CM | POA: Insufficient documentation

## 2014-11-29 DIAGNOSIS — Z9013 Acquired absence of bilateral breasts and nipples: Secondary | ICD-10-CM | POA: Insufficient documentation

## 2014-11-29 DIAGNOSIS — K769 Liver disease, unspecified: Secondary | ICD-10-CM | POA: Diagnosis not present

## 2014-11-29 DIAGNOSIS — R918 Other nonspecific abnormal finding of lung field: Secondary | ICD-10-CM | POA: Diagnosis not present

## 2014-11-29 DIAGNOSIS — J439 Emphysema, unspecified: Secondary | ICD-10-CM | POA: Diagnosis not present

## 2014-11-29 DIAGNOSIS — Z853 Personal history of malignant neoplasm of breast: Secondary | ICD-10-CM | POA: Diagnosis not present

## 2014-11-29 DIAGNOSIS — J984 Other disorders of lung: Secondary | ICD-10-CM | POA: Diagnosis not present

## 2014-11-29 DIAGNOSIS — I722 Aneurysm of renal artery: Secondary | ICD-10-CM | POA: Diagnosis not present

## 2014-11-29 DIAGNOSIS — C8590 Non-Hodgkin lymphoma, unspecified, unspecified site: Secondary | ICD-10-CM

## 2014-11-29 DIAGNOSIS — Z8572 Personal history of non-Hodgkin lymphomas: Secondary | ICD-10-CM | POA: Diagnosis not present

## 2014-11-29 DIAGNOSIS — R630 Anorexia: Secondary | ICD-10-CM | POA: Insufficient documentation

## 2014-11-29 DIAGNOSIS — R531 Weakness: Secondary | ICD-10-CM | POA: Insufficient documentation

## 2014-11-30 ENCOUNTER — Telehealth: Payer: Self-pay | Admitting: *Deleted

## 2014-11-30 NOTE — Telephone Encounter (Signed)
Spoke with pt's husband, informed him CTs show no evidence of lymphoma or breast cancer. He voiced understanding. Confirmed appointment for 5/11.

## 2014-11-30 NOTE — Telephone Encounter (Signed)
-----   Message from [name], MD sent at 11/29/2014  8:50 PM EDT ----- Please call patient, Cts show no evidence of lymphoma or breast cancer

## 2014-11-30 NOTE — Therapy (Signed)
[facility] [address] [place], [name], [address] Phone: [phone]   Fax:  [phone]  Physical Therapy Treatment  Patient Details  Name: [name] MRN: [id number] Date of Birth: 07-13-1928 Referring Provider:  [name], MD  Encounter Date: 11/28/2014      PT End of Session - 11/30/14 0919    Visit Number 4   Number of Visits 9   Date for PT Re-Evaluation 01/08/15   PT Start Time 1105   PT Stop Time 1147   PT Time Calculation (min) 42 min   Activity Tolerance Patient limited by fatigue  pt c/o jittery, anxiousness, dizziness, wooziness   Behavior During Therapy Anxious      Past Medical History  Diagnosis Date  . Asthma   . Hearing loss   . Diabetes mellitus without complication     4/74/25.Marland KitchenMarland Kitchenpt denies  . GERD (gastroesophageal reflux disease)   . NHL (non-Hodgkin's lymphoma)     nhl dx 9/04 breast ca dx1/12    Past Surgical History  Procedure Laterality Date  . Mastectomy  2 /8/ 12    bilateral  . Ba-ha ear implant      Filed Vitals:   11/28/14 1115  BP: 178/84  SpO2: 73%    Visit Diagnosis:  Abnormality of gait      Subjective Assessment - 11/30/14 0913    Subjective Feel anxious, dizzy, woozy.     Patient is accompained by: Family member  husband   Currently in Pain? No/denies     Spent majority of session discussing pt's feeling of jittery-ness, wooziness, dizziness.  Pt is taking short, shallow breaths and reports she feels anxious.  Husband says this occurs in the mornings, but after her medications, by mid-day to afternoon, she is usually up and doing household chores.  Pt's responds well in sitting to cues for pursed lip breathing, with O2 sats going from 73% to 98% within matter of seconds.  Explained importance of proper breathing techniques, slowing breathing pace.    Trialed gait for 150 ft with HHA, with pt continuing to c/o wooziness and lightheadedness.   Pt uses 4-wheeled RW for 300 ft with improved ease of movement, increased step length and decreased c/o wooziness/dizziness.  Discussed with pt and husband using her 4-wheeled rollator at home for improved ease of movement and decreased anxiousness with gait.                            PT Education - 11/30/14 [phone]    Education Details use rollator at home; breathing techniques, discuss/follow-up with physician regarding pt's continued complaints of anxiety, dizziness limiting participation in therapy   Person(s) Educated Patient   Methods Explanation;Verbal cues   Comprehension Verbalized understanding             PT Long Term Goals - 11/09/14 1208    PT LONG TERM GOAL #1   Title Pt will perform HEP with family supervision for improved balance, strength and gait. (4 wk Target 12/08/14)   Time 4   Period Weeks   Status New   PT LONG TERM GOAL #2   Title Pt will improve Functional Gait Assessment to at least 20/30 for decreased fall risk.   Time 4   Period Weeks   Status New   PT LONG TERM GOAL #3   Title Pt will improve gait velocity backwards direction to at least 1 ft/sec for improved balance  recovery in posterior direction.   Time 4   Period Weeks   Status New   PT LONG TERM GOAL #4   Title Pt will verbalize plans for continued community fitness upon D/C from PT.   Time 4   Period Weeks   Status New               Plan - 11/30/14 0920    Clinical Impression Statement Pt  continues to not feel well today.  Pt with short distance ambulation in clinic does well with rollator walker.  Pt seems to feel more secure with walker, with less anxiety reported at end of session.   Pt will benefit from skilled therapeutic intervention in order to improve on the following deficits Abnormal gait;Decreased balance;Decreased mobility;Decreased strength;Postural dysfunction   Rehab Potential Good   PT Frequency 2x / week   PT Duration 4 weeks   PT  Treatment/Interventions ADLs/Self Care Home Management;Gait training;Stair training;Functional mobility training;Therapeutic activities;Neuromuscular re-education;Balance training;Therapeutic exercise;Patient/family education   PT Next Visit Plan Review ankle stregnthening and sit<>stand as HEP given at eval; Initiate additions to HEP   Consulted and Agree with Plan of Care Patient;Family member/caregiver   Family Member Consulted spouse        Problem List Patient Active Problem List   Diagnosis Date Noted  . Paralysis agitans 09/19/2012  . Lymphoma 07/03/2011  . Malignant neoplasm of breast (female), unspecified site 07/03/2011  . Abnormal CT of the chest 03/13/2011  . HYPERTENSION, BENIGN 01/01/2009  . EDEMA 01/01/2009    Judianne Seiple W. 11/30/2014, 9:22 AM Frazier Butt., PT [facility] [address] [place], [name], [address] Phone: [phone]   Fax:  [phone]

## 2014-12-03 DIAGNOSIS — I10 Essential (primary) hypertension: Secondary | ICD-10-CM | POA: Diagnosis not present

## 2014-12-03 DIAGNOSIS — F419 Anxiety disorder, unspecified: Secondary | ICD-10-CM | POA: Diagnosis not present

## 2014-12-03 DIAGNOSIS — E785 Hyperlipidemia, unspecified: Secondary | ICD-10-CM | POA: Diagnosis not present

## 2014-12-03 DIAGNOSIS — K219 Gastro-esophageal reflux disease without esophagitis: Secondary | ICD-10-CM | POA: Diagnosis not present

## 2014-12-03 DIAGNOSIS — F329 Major depressive disorder, single episode, unspecified: Secondary | ICD-10-CM | POA: Diagnosis not present

## 2014-12-04 ENCOUNTER — Ambulatory Visit: Payer: Medicare Other | Admitting: Occupational Therapy

## 2014-12-04 ENCOUNTER — Ambulatory Visit: Payer: Medicare Other

## 2014-12-04 VITALS — BP 148/73

## 2014-12-04 VITALS — BP 118/56 | HR 84

## 2014-12-04 DIAGNOSIS — R490 Dysphonia: Secondary | ICD-10-CM

## 2014-12-04 DIAGNOSIS — R29898 Other symptoms and signs involving the musculoskeletal system: Secondary | ICD-10-CM

## 2014-12-04 DIAGNOSIS — R4189 Other symptoms and signs involving cognitive functions and awareness: Secondary | ICD-10-CM

## 2014-12-04 DIAGNOSIS — R269 Unspecified abnormalities of gait and mobility: Secondary | ICD-10-CM

## 2014-12-04 DIAGNOSIS — R279 Unspecified lack of coordination: Secondary | ICD-10-CM

## 2014-12-04 DIAGNOSIS — R258 Other abnormal involuntary movements: Secondary | ICD-10-CM

## 2014-12-04 DIAGNOSIS — G20 Parkinson's disease: Secondary | ICD-10-CM

## 2014-12-04 NOTE — Therapy (Signed)
[facility] [address] [place], [name], [address] Phone: [phone]   Fax:  [phone]  Speech Language Pathology Treatment  Patient Details  Name: [name] MRN: [id number] Date of Birth: Apr 01, 1928 Referring Provider:  [name], MD  Encounter Date: 12/04/2014      End of Session - 12/04/14 1242    Visit Number 6   Number of Visits 16   Date for SLP Re-Evaluation 01/08/15   SLP Start Time 28   SLP Stop Time  1146   SLP Time Calculation (min) 41 min   Activity Tolerance Patient tolerated treatment well      Past Medical History  Diagnosis Date  . Asthma   . Hearing loss   . Diabetes mellitus without complication     9/93/71.Marland KitchenMarland Kitchenpt denies  . GERD (gastroesophageal reflux disease)   . NHL (non-Hodgkin's lymphoma)     nhl dx 9/04 breast ca dx1/12    Past Surgical History  Procedure Laterality Date  . Mastectomy  2 /8/ 12    bilateral  . Ba-ha ear implant      There were no vitals filed for this visit.  Visit Diagnosis: Hypokinetic Parkinsonian dysphonia      Subjective Assessment - 12/04/14 1117    Subjective Pt went to PCP [facility] Kingfisher) yesterday. Per pt, he took pt off of BP med.               ADULT SLP TREATMENT - 12/04/14 1120    General Information   Behavior/Cognition Alert;Cooperative;Confused   Treatment Provided   Treatment provided Cognitive-Linquistic   Pain Assessment   Pain Assessment No/denies pain   Cognitive-Linquistic Treatment   Treatment focused on Dysarthria   Skilled Treatment Loud "hey!" with average 87dB. Loud /a/ average 87dB.  Pt reports feeling much less "jittery" this morning than previous weeks. SLP faciliatated practice with loud speech to improve communication with friends and family. Opposites provided with average 74dB. Various levels of sentence/multi-sentence tasks were completed: Dual meaning words with average 72dB; skilled observation  used to ID pt's cognitive load decreasing pt ability to focus on loudness, rarely. (volume decr'd to average 69dB at these times). Item description with uncommon items (roller coaster, binoculars, chicken pox) with average 70dB and rare verbal cues for loudness. Absurdities in sentences told with average 71dB. Pt's opinions provided in order to increase cognitive load and practice with WNL speech during these times. Pt's average loudness 69dB. She told SLP when she thought she was softer, at appropriate times. She self corrected by repeating the idea of her last utterance and attempting louder speech, successful approx 50% of the time.   Assessment / Recommendations / Plan   Plan Continue with current plan of care   Progression Toward Goals   Progression toward goals Progressing toward goals  much better success with tasks today than previous 2 weeks            SLP Short Term Goals - 12/04/14 1249    SLP SHORT TERM GOAL #1   Title Pt will demonsrate sustained /a/ of 84dB with occassional min A  (12/08/14)   Time 2   Period Weeks   Status Achieved   SLP SHORT TERM GOAL #2   Title Pt will maintain average of 72dB during structured speech tasks with occassional min A over 3 sessions (12/08/14)  modified due to consistency   Time 2   Period Weeks   Status Revised   SLP SHORT TERM GOAL #  3   Title Pt will report using throat clear alternatives over 4 sessions and demonstrate less than 4 throat clears in a session with occassional min A (12/08/14)   Time 2   Period Weeks   Status On-going          SLP Long Term Goals - 12/04/14 1249    SLP LONG TERM GOAL #1   Title Maintain an average of 70dB during conversation over 12 minutes with rare min A over two sessions (01/08/15)  revised due to consistency   Time 6   Period Weeks   Status On-going   SLP LONG TERM GOAL #2   Title Philis Nettle audilbly in a noisy environment for 10 minutes with rare min A (01/08/15)   Time 6   Period Weeks    Status On-going          Plan - 12/04/14 1247    Clinical Impression Statement Pt much better today's session than last 2-3 weeks. Loudness was more consistent over session and from task to task. Skilled St remains needed to maximize loudness acrosss speaking situations.   Speech Therapy Frequency 2x / week   Duration --  6 weeks   Treatment/Interventions Compensatory strategies;Functional tasks;Patient/family education;Internal/external aids;SLP instruction and feedback   Potential Considerations Previous level of function        Problem List Patient Active Problem List   Diagnosis Date Noted  . Paralysis agitans 09/19/2012  . Lymphoma 07/03/2011  . Malignant neoplasm of breast (female), unspecified site 07/03/2011  . Abnormal CT of the chest 03/13/2011  . HYPERTENSION, BENIGN 01/01/2009  . EDEMA 01/01/2009    Keniya Schlotterbeck,slp 12/04/2014, 12:50 PM  [facility] [address] [place], [name], [address] Phone: [phone]   Fax:  [phone]

## 2014-12-04 NOTE — Patient Instructions (Signed)
Resistive Band Rowing   With green resistive band anchored in door, grasp both ends. Keeping elbows bent, pull back, squeezing shoulder blades together. Hold 2 seconds. Perform 3 sets of 20, make sure you don't lean backward, keep your feet planted on the ground. http://gt2.exer.us/98   Copyright  VHI. All rights reserved.    High marching with Large arm swing -March along your kitchen counter, lifting you knee up as high as you can and at the same time, lifting the opposite arm up over your head. Perform 3 laps at your counter daily   Turning  Turn around in a full circle, clockwise, making sure your feet fully lift off the floor. Then pause and turn counter-clockwise in the same manner. Repeat cycle 3x daily.    FUNCTIONAL MOBILITY: Heel Walking   Walk forward on heels. Hold counter only as needed for balance. When you get to the end of the counter, walk backward on heels. Perform 3 laps at counter daily Copyright  VHI. All rights reserved.

## 2014-12-04 NOTE — Patient Instructions (Signed)

## 2014-12-04 NOTE — Therapy (Signed)
[facility] [address] [place], [name], [address] Phone: [phone]   Fax:  [phone]  Physical Therapy Treatment  Patient Details  Name: [name] MRN: [id number] Date of Birth: October 24, 1927 Referring Provider:  [name], MD  Encounter Date: 12/04/2014      PT End of Session - 12/04/14 1011    Visit Number 5   Number of Visits 9   Date for PT Re-Evaluation 01/08/15   PT Start Time 0935   PT Stop Time 1015   PT Time Calculation (min) 40 min      Past Medical History  Diagnosis Date  . Asthma   . Hearing loss   . Diabetes mellitus without complication     7/61/60.Marland KitchenMarland Kitchenpt denies  . GERD (gastroesophageal reflux disease)   . NHL (non-Hodgkin's lymphoma)     nhl dx 9/04 breast ca dx1/12    Past Surgical History  Procedure Laterality Date  . Mastectomy  2 /8/ 12    bilateral  . Ba-ha ear implant      Filed Vitals:   12/04/14 0948  BP: 118/56  Pulse: 84    Visit Diagnosis:  Lack of coordination  Rigidity  Abnormality of gait      Subjective Assessment - 12/04/14 0941    Subjective Feels extra tired today, but reports she hasn't had any more of the spells she was previously having (extreme fatigue and hypertension)   Currently in Pain? No/denies       Reviewed HEP provided on eval. Pt performs these exercises perfectly.   The patient was taught, performed, and was provided with a home exercise program to address postural muscle strength and balance. See pt instructions for details.  Sci Fit x8 minutes with RPM 76 all extremities to work on increased intensity of movement and improved endurance.                           PT Education - 12/04/14 1011    Education provided Yes   Education Details HEP   Person(s) Educated Patient   Methods Explanation;Handout;Demonstration   Comprehension Verbalized understanding;Returned demonstration              PT Long Term Goals - 11/09/14 1208    PT LONG TERM GOAL #1   Title Pt will perform HEP with family supervision for improved balance, strength and gait. (4 wk Target 12/08/14)   Time 4   Period Weeks   Status New   PT LONG TERM GOAL #2   Title Pt will improve Functional Gait Assessment to at least 20/30 for decreased fall risk.   Time 4   Period Weeks   Status New   PT LONG TERM GOAL #3   Title Pt will improve gait velocity backwards direction to at least 1 ft/sec for improved balance recovery in posterior direction.   Time 4   Period Weeks   Status New   PT LONG TERM GOAL #4   Title Pt will verbalize plans for continued community fitness upon D/C from PT.   Time 4   Period Weeks   Status New               Plan - 12/04/14 1011    Clinical Impression Statement Pt feeling tired but better than previous session. Balance with ambulation without assistive device was good today. Updated HEP. Continue per POC   PT Next Visit Plan Review new HEP  Consulted and Agree with Plan of Care Patient        Problem List Patient Active Problem List   Diagnosis Date Noted  . Paralysis agitans 09/19/2012  . Lymphoma 07/03/2011  . Malignant neoplasm of breast (female), unspecified site 07/03/2011  . Abnormal CT of the chest 03/13/2011  . HYPERTENSION, BENIGN 01/01/2009  . EDEMA 01/01/2009    Delrae Sawyers, PT,DPT,NCS 12/04/2014 10:19 AM Phone [phone] FAX [phone]         [facility] [address] [place], [name], [address] Phone: [phone]   Fax:  [phone]

## 2014-12-04 NOTE — Therapy (Signed)
[facility] [address] [place], [name], [address] Phone: [phone]   Fax:  [phone]  Occupational Therapy Treatment  Patient Details  Name: [name] MRN: [id number] Date of Birth: Feb 17, 1928 Referring Provider:  [name], MD  Encounter Date: 12/04/2014      OT End of Session - 12/04/14 1024    Visit Number 5   Number of Visits 9   Date for OT Re-Evaluation 12/19/14   Authorization Type 1-[employer], 2-BCBS, G-code needed   Authorization Time Period cert period 4/43/15-4/00/86   Authorization - Visit Number 5   Authorization - Number of Visits 10   OT Start Time 1020   OT Stop Time 1100   OT Time Calculation (min) 40 min      Past Medical History  Diagnosis Date  . Asthma   . Hearing loss   . Diabetes mellitus without complication     7/61/95.Marland KitchenMarland Kitchenpt denies  . GERD (gastroesophageal reflux disease)   . NHL (non-Hodgkin's lymphoma)     nhl dx 9/04 breast ca dx1/12    Past Surgical History  Procedure Laterality Date  . Mastectomy  2 /8/ 12    bilateral  . Ba-ha ear implant      Filed Vitals:   12/04/14 1102  BP: 148/73    Visit Diagnosis:  Lack of coordination  Rigidity  Cognitive deficits  Bradykinesia                    OT Treatments/Exercises (OP) - 12/04/14 0001    Cognitive Exercises   Other Cognitive Exercises 1 Pt was educated regarding strategies to compensate for short term memory deficits.   Functional Reaching Activities   Mid Level To copy small peg design on vertical surface, min v.c. for design, with RUE, min v.c., emphasis on in hand manipulation when removing pegs, min v.c.           PWR ([facility]) - 12/04/14 1719    PWR! exercises Moves in supine   PWR! Up 10   PWR! Rock 10   PWR! Twist 10   PWR! Step 10   Comments min v.c.             OT Education - 12/04/14 1717    Education provided Yes   Education Details memory  compensation strategies, PWR! supine   Person(s) Educated Patient   Methods Explanation;Demonstration   Comprehension Verbalized understanding;Returned demonstration             OT Long Term Goals - 11/19/14 1234    OT LONG TERM GOAL #1   Title Pt will be independent with updated HEP.--Goals due 12/19/14   Time 4   Period Weeks   Status New   OT LONG TERM GOAL #2   Title Pt will improve R dominant hand coordination for ADLs as shown by improving 9-hole peg test score by at least 3sec.    Baseline R-28.82, L-25.72   Time 4   Period Weeks   Status New   OT LONG TERM GOAL #3   Title Pt will verbalize understanding of updated strategies for ADLs/IADLs to incr ease/decrease time required.   Time 4   Period Weeks   Status New   OT LONG TERM GOAL #4   Title Pt will improve balance/functional reaching for ADLs/IADLs as shown by improving RUE standing functional reach to at least 11 inches.   Baseline R-9, L-11 inches   Time 4   Period Weeks  Status New   OT LONG TERM GOAL #5   Title Pt will verbalize understanding of memory compensation strategies for ADLs prn.   Time 4   Period Weeks   Status New               Plan - 12/04/14 1057    Clinical Impression Statement Pt is progressing towards goals. Pt is feeling better and reports MD d/c her BP medication.   Pt will benefit from skilled therapeutic intervention in order to improve on the following deficits (Retired) Decreased balance;Decreased activity tolerance;Decreased coordination;Impaired tone;Decreased cognition;Decreased knowledge of use of DME;Decreased mobility;Impaired UE functional use   Rehab Potential Good   OT Frequency 2x / week   Plan ADL strategies/ large amplitude movements   OT Home Exercise Plan issued: PWR! supine 11/23/14, hand writing handout 11/23/14, big movements with ADLS handout- 10/03/74        Problem List Patient Active Problem List   Diagnosis Date Noted  . Paralysis agitans  09/19/2012  . Lymphoma 07/03/2011  . Malignant neoplasm of breast (female), unspecified site 07/03/2011  . Abnormal CT of the chest 03/13/2011  . HYPERTENSION, BENIGN 01/01/2009  . EDEMA 01/01/2009    Yaeko Fazekas 12/04/2014, 5:24 PM Theone Murdoch, OTR/L [phone] Phone: [phone] 5:24 PM 12/04/2014 [facility] [address] [place], [name], [address] Phone: [phone]   Fax:  [phone]

## 2014-12-05 ENCOUNTER — Ambulatory Visit (HOSPITAL_BASED_OUTPATIENT_CLINIC_OR_DEPARTMENT_OTHER): Payer: Medicare Other | Admitting: Nurse Practitioner

## 2014-12-05 ENCOUNTER — Telehealth: Payer: Self-pay | Admitting: Nurse Practitioner

## 2014-12-05 VITALS — BP 137/63 | HR 73 | Temp 97.5°F | Resp 18 | Ht 63.0 in | Wt 112.7 lb

## 2014-12-05 DIAGNOSIS — Z8572 Personal history of non-Hodgkin lymphomas: Secondary | ICD-10-CM

## 2014-12-05 DIAGNOSIS — C8590 Non-Hodgkin lymphoma, unspecified, unspecified site: Secondary | ICD-10-CM

## 2014-12-05 DIAGNOSIS — Z853 Personal history of malignant neoplasm of breast: Secondary | ICD-10-CM | POA: Diagnosis not present

## 2014-12-05 NOTE — Telephone Encounter (Signed)
per pof to sch pt appt-gave pt copy of sch °

## 2014-12-05 NOTE — Progress Notes (Addendum)
[facility] OFFICE PROGRESS NOTE   Diagnosis:  Non-Hodgkin's lymphoma, breast cancer  INTERVAL HISTORY:   Ms. Bermingham returns as scheduled. She continues to feel "jittery and lightheaded" mainly in the mornings. Blood pressure medication was recently discontinued. In general she states that she does not feel well. She has an alteration in taste. Appetite has been decreased for about 3 months. No fevers or sweats. She is on no new medications. Her husband notes memory issues over the past several weeks to a few months.  Objective:  Vital signs in last 24 hours:  Blood pressure 137/63, pulse 73, temperature 97.5 F (36.4 C), temperature source Oral, resp. rate 18, height 5' 3" (1.6 m), weight 112 lb 11.2 oz (51.12 kg), SpO2 99 %.    HEENT: No thrush or ulcers. Lymphatics: No palpable cervical or supra-clavicular lymph nodes. Resp: Lungs clear bilaterally. Cardio: Regular rate and rhythm. GI: Abdomen soft and nontender. No mass. No organomegaly. Vascular: No leg edema.  Lab Results:  Lab Results  Component Value Date   WBC 4.7 11/22/2014   HGB 11.7 11/22/2014   HCT 34.7* 11/22/2014   MCV 89.2 11/22/2014   PLT 122* 11/22/2014   NEUTROABS 3.5 11/22/2014    Imaging:  No results found.  Medications: I have reviewed the patient's current medications.  Assessment/Plan: 1. Non-Hodgkin lymphoma treated with fludarabine/rituximab, last in July of 2009.  1. Restaging CT 11/24/2010 revealed evidence of progressive lymphoma in the abdomen and pelvis.  2. Initiation of salvage therapy with bendamustine/rituximab 12/04/2010. She completed 3 cycles.  3. Restaging CT 03/03/2011 revealed stable retroperitoneal soft tissue and a marked decrease in the soft tissue thickening associated with dilated loops of small bowel. 2. History of ITP. Intermittent mild thrombocytopenia persists. 3. History of Herpes zoster-maintained on prophylactic acyclovir. 4. Anemia secondary  to non-Hodgkin lymphoma, chemotherapy, and a history of autoimmune hemolysis.  5. Hypertension. 6. Right-hand tremor/ataxia-followed by neurology, now taking Sinemet, diagnosed with Parkinson's disease 7. Hearing loss-status post placement of an implanted hearing device by Dr. Cresenciano Lick. 8. Bilateral breast cancer, right breast cancer-grade 1, T1 N0, ER/PR positive, and HER2 negative. Left breast cancer, synchronous grade 2, T2 N0, ER/PR positive, and HER2 negative. She began adjuvant Arimidex on 09/15/2010. 9. Inflammatory changes of both lungs on a CT of the chest 11/24/2010-progressive on the restaging CT 03/03/2011, status post a bronchoscopy 03/17/2011 with no evidence of granulomata or tumor. 10. 11 mm spiculated lesion in the lingula on a CT 11/24/2010-less distinct on the CT 03/03/2011. 11. Upper respiratory infection while visiting her son in [place], June 2013, resolved after treatment with Levaquin/steroids 12. Upper respiratory infection,? Pneumonia summer 2014-chest x-ray 02/17/2013 with bilateral infiltrates 13. CT chest abdomen and pelvis on 11/29/2014 with no evidence of active lymphoma or metastatic breast cancer. Previously demonstrated retroperitoneal process appeared improved with probable residual fibrosis. No discrete adenopathy. Interval near complete resolution of patchy airspace opacities in both lungs.   Disposition: Ms. Richer has a history of breast cancer and non-Hodgkin's lymphoma. Recent restaging CT scans showed no evidence of active malignancy. Question if the failure to thrive and memory issues are related to Parkinson's disease. She has an upcoming appointment with her neurologist.  She will return for a follow-up visit here in 3 months. She and her husband understand they can contact the office in the interim with problems, questions or concerns.  Patient seen with Dr. Benay Spice.    Ned Card ANP/GNP-BC   12/05/2014  11:16 AM This was  a shared visit  with Ned Card. The CTs and recent laboratory studies revealed no evidence of active lymphoma or breast cancer. She will follow-up with Dr. Kenton Kingfisher and her neurologist.  Julieanne Manson, M.D.

## 2014-12-06 DIAGNOSIS — H524 Presbyopia: Secondary | ICD-10-CM | POA: Diagnosis not present

## 2014-12-06 DIAGNOSIS — H532 Diplopia: Secondary | ICD-10-CM | POA: Diagnosis not present

## 2014-12-07 ENCOUNTER — Ambulatory Visit: Payer: Medicare Other | Admitting: Occupational Therapy

## 2014-12-07 ENCOUNTER — Ambulatory Visit: Payer: Medicare Other

## 2014-12-07 ENCOUNTER — Ambulatory Visit: Payer: Medicare Other | Admitting: Physical Therapy

## 2014-12-07 DIAGNOSIS — R258 Other abnormal involuntary movements: Secondary | ICD-10-CM

## 2014-12-07 DIAGNOSIS — R269 Unspecified abnormalities of gait and mobility: Secondary | ICD-10-CM

## 2014-12-07 DIAGNOSIS — R490 Dysphonia: Secondary | ICD-10-CM

## 2014-12-07 DIAGNOSIS — R4189 Other symptoms and signs involving cognitive functions and awareness: Secondary | ICD-10-CM | POA: Diagnosis not present

## 2014-12-07 DIAGNOSIS — R29898 Other symptoms and signs involving the musculoskeletal system: Secondary | ICD-10-CM

## 2014-12-07 DIAGNOSIS — R279 Unspecified lack of coordination: Secondary | ICD-10-CM

## 2014-12-07 DIAGNOSIS — G20 Parkinson's disease: Secondary | ICD-10-CM

## 2014-12-07 NOTE — Therapy (Signed)
[facility] [address] [place], [name], [address] Phone: [phone]   Fax:  [phone]  Physical Therapy Treatment  Patient Details  Name: [name] MRN: [id number] Date of Birth: 08-19-1927 Referring Provider:  [name], MD  Encounter Date: 12/07/2014      PT End of Session - 12/07/14 1228    Visit Number 6   Number of Visits 9   Date for PT Re-Evaluation 01/08/15   PT Start Time 0935   PT Stop Time 1015   PT Time Calculation (min) 40 min   Activity Tolerance Patient tolerated treatment well   Behavior During Therapy [facility] for tasks assessed/performed      Past Medical History  Diagnosis Date  . Asthma   . Hearing loss   . Diabetes mellitus without complication     02/18/35.Marland KitchenMarland Kitchenpt denies  . GERD (gastroesophageal reflux disease)   . NHL (non-Hodgkin's lymphoma)     nhl dx 9/04 breast ca dx1/12    Past Surgical History  Procedure Laterality Date  . Mastectomy  2 /8/ 12    bilateral  . Ba-ha ear implant      There were no vitals filed for this visit.  Visit Diagnosis:  Abnormality of gait  Bradykinesia      Subjective Assessment - 12/07/14 0937    Subjective Overall, feel better this week; but she still had at least one episode of feeling jittery and not feeling well.   Currently in Pain? No/denies      Reviewed HEP given last visit-pt return demo understanding. -Sidestepping at counter, 2 reps 10 ft each direction with UE support.   Standing with feet apart with head turns and head nods x 5 reps each with cues for visual target.  -At 6 inch step-forward step taps x 10 reps, side step taps x 10 reps with UE support.  Forward step ups x 10 reps each leg.   Pt requires seated rest breaks during activity.                 [facility] Adult PT Treatment/Exercise - 12/07/14 1005    Transfers   Transfers Sit to Stand;Stand to Sit   Sit to Stand 6: Modified independent  (Device/Increase time);With upper extremity assist;From chair/3-in-1   Stand to Sit 6: Modified independent (Device/Increase time);With upper extremity assist;To chair/3-in-1   Ambulation/Gait   Ambulation/Gait Yes   Ambulation/Gait Assistance 6: Modified independent (Device/Increase time)   Ambulation Distance (Feet) 400 Feet  no device; then 350 ft 4-wheeled RW   Gait Pattern Narrow base of support  decreased foot clearance bilaterally; forward flexed                     PT Long Term Goals - 12/07/14 1230    PT LONG TERM GOAL #1   Title Pt will perform HEP with family supervision for improved balance, strength and gait. (4 wk Target 12/08/14)   Status Achieved   PT LONG TERM GOAL #2   Title Pt will improve Functional Gait Assessment to at least 20/30 for decreased fall risk.   Time 4   Period Weeks   Status New   PT LONG TERM GOAL #3   Title Pt will improve gait velocity backwards direction to at least 1 ft/sec for improved balance recovery in posterior direction.   Time 4   Period Weeks   Status New   PT LONG TERM GOAL #4   Title Pt will verbalize  plans for continued community fitness upon D/C from PT.   Time 4   Period Weeks   Status New               Plan - 12/07/14 1228    Clinical Impression Statement Pt participates in therapy more today and appears to feel better, despite needing multiple rest breaks.  Pt may discharge next week, at end of plan of care, with plans to continue HEP at home.   Pt will benefit from skilled therapeutic intervention in order to improve on the following deficits Abnormal gait;Decreased balance;Decreased mobility;Decreased strength;Postural dysfunction   Rehab Potential Good   PT Frequency 2x / week   PT Duration 4 weeks   PT Treatment/Interventions ADLs/Self Care Home Management;Gait training;Stair training;Functional mobility training;Therapeutic activities;Neuromuscular re-education;Balance training;Therapeutic  exercise;Patient/family education   PT Next Visit Plan Check goals and plan to discharge next week; discuss walking program on a consistent basis; have husband present prior to D/C   Consulted and Agree with Plan of Care Patient        Problem List Patient Active Problem List   Diagnosis Date Noted  . Paralysis agitans 09/19/2012  . Lymphoma 07/03/2011  . Malignant neoplasm of breast (female), unspecified site 07/03/2011  . Abnormal CT of the chest 03/13/2011  . HYPERTENSION, BENIGN 01/01/2009  . EDEMA 01/01/2009    Izaha Shughart W. 12/07/2014, 12:33 PM  Frazier Butt., PT  [facility] [address] [place], [name], [address] Phone: [phone]   Fax:  [phone]

## 2014-12-07 NOTE — Patient Instructions (Signed)
  Please complete the assigned speech therapy homework prior to your next session.  

## 2014-12-07 NOTE — Therapy (Signed)
[facility] [address] [place], [name], [address] Phone: [phone]   Fax:  [phone]  Occupational Therapy Treatment  Patient Details  Name: [name] MRN: [id number] Date of Birth: May 11, 1928 Referring Provider:  [name], MD  Encounter Date: 12/07/2014      OT End of Session - 12/07/14 1046    Visit Number 6   Number of Visits 9   Date for OT Re-Evaluation 12/19/14   Authorization Type 1-Medicare [employer], 2-BCBS, G-code needed   Authorization Time Period cert period 3/61/44-10/09/38   Authorization - Visit Number 6   Authorization - Number of Visits 10   OT Start Time 1017   OT Stop Time 1100   OT Time Calculation (min) 43 min   Activity Tolerance Patient tolerated treatment well   Behavior During Therapy [facility] for tasks assessed/performed      Past Medical History  Diagnosis Date  . Asthma   . Hearing loss   . Diabetes mellitus without complication     0/86/76.Marland KitchenMarland Kitchenpt denies  . GERD (gastroesophageal reflux disease)   . NHL (non-Hodgkin's lymphoma)     nhl dx 9/04 breast ca dx1/12    Past Surgical History  Procedure Laterality Date  . Mastectomy  2 /8/ 12    bilateral  . Ba-ha ear implant      There were no vitals filed for this visit.  Visit Diagnosis:  Bradykinesia  Lack of coordination  Rigidity  Cognitive deficits      Subjective Assessment - 12/07/14 1026    Subjective  Pt reports she got very fatigued after PWR! execises in supine last time   Patient Stated Goals improve ADLs   Currently in Pain? No/denies                Treatment: Self care: Bag exercises for simulated ADLS:( see pt instructions) . Pt practiced donning /doffing jacket with adapted technique following v.c./ demonstration for therapy. Pt practiced cutting food using adapted techniques with larger movements, min v.c. Theraputic activities: Pt was instructed in coordination HEP, with  emphasis on large amplitude movements, min v.c.(see pt instructions)              OT Education - 12/07/14 1328    Education provided Yes   Education Details bag exercises HEP , coordination HEP   Person(s) Educated Patient   Methods Explanation;Demonstration   Comprehension Verbalized understanding;Returned demonstration             OT Long Term Goals - 11/19/14 1234    OT LONG TERM GOAL #1   Title Pt will be independent with updated HEP.--Goals due 12/19/14   Time 4   Period Weeks   Status New   OT LONG TERM GOAL #2   Title Pt will improve R dominant hand coordination for ADLs as shown by improving 9-hole peg test score by at least 3sec.    Baseline R-28.82, L-25.72   Time 4   Period Weeks   Status New   OT LONG TERM GOAL #3   Title Pt will verbalize understanding of updated strategies for ADLs/IADLs to incr ease/decrease time required.   Time 4   Period Weeks   Status New   OT LONG TERM GOAL #4   Title Pt will improve balance/functional reaching for ADLs/IADLs as shown by improving RUE standing functional reach to at least 11 inches.   Baseline R-9, L-11 inches   Time 4   Period Weeks   Status New  OT LONG TERM GOAL #5   Title Pt will verbalize understanding of memory compensation strategies for ADLs prn.   Time 4   Period Weeks   Status New               Plan - 12/07/14 1328    Clinical Impression Statement Pt continues to report significant fatigue following PWR! exercises in supine, therapist recommended pt stop performing. Pt was issued bag exercises for home.   Pt will benefit from skilled therapeutic intervention in order to improve on the following deficits (Retired) Decreased balance;Decreased activity tolerance;Decreased coordination;Impaired tone;Decreased cognition;Decreased knowledge of use of DME;Decreased mobility;Impaired UE functional use   Rehab Potential Good   OT Frequency 2x / week   OT Duration 4 weeks   OT  Treatment/Interventions Self-care/ADL training;Cryotherapy;Ultrasound;DME and/or AE instruction;Manual Therapy;Passive range of motion;Therapeutic activities;Balance training;Patient/family education;Therapeutic exercises;Functional Mobility Training;Energy conservation;Moist Heat;Fluidtherapy;Neuromuscular education;Splinting;Cognitive remediation/compensation;Therapeutic exercise   Plan check goals next week, anticipate d/c   OT Home Exercise Plan issued: PWR! supine 11/23/14, hand writing handout 11/23/14, big movements with ADLS handout- 01/03/44, memory strategies, handout issued, bag exercises (12/07/14), coord exercises 12/07/14   Consulted and Agree with Plan of Care Patient        Problem List Patient Active Problem List   Diagnosis Date Noted  . Paralysis agitans 09/19/2012  . Lymphoma 07/03/2011  . Malignant neoplasm of breast (female), unspecified site 07/03/2011  . Abnormal CT of the chest 03/13/2011  . HYPERTENSION, BENIGN 01/01/2009  . EDEMA 01/01/2009    RINE,KATHRYN 12/07/2014, 1:44 PM Theone Murdoch, OTR/L [phone] Phone: [phone] 1:44 PM 12/07/2014 [facility] [address] [place], [name], [address] Phone: [phone]   Fax:  [phone]

## 2014-12-07 NOTE — Patient Instructions (Addendum)
Bag Exercises:  Small trash bag or produce bag works best.  For all exercises, sit with big posture (sit up tall with head up) and use big movements. Perform the following exercises 1 times per day.   Hold end of bag in one hand. Stretch fingers out big to draw the entire bag into your palm. Repeat 2 times with each hand.  Hold bag in one hand. Stretch both arms/hands out to the side as big as you can. Then, pass bag from one hand to the other IN FRONT of you. Stretch arms back out big after each pass. Repeat 10 times.  Hold bag in one hand. Stretch both arms/hands out to the side as big as you can. Then, pass bag from one hand to the other BEHIND you. Stretch arms back out big after each pass. Repeat 10 times.  Hold bag in both hands in front of you with hands/arms shoulder length apart. Move bag behind your head. Repeat 10 times.  Hold bag in both hands in front of you with hands/arms shoulder length apart. Lift leg and move bag completely under each foot and back. Repeat 10 times on each side.          Coordination Exercises  Perform the following exercises for 20 minutes 1 times per day. Perform with both hand(s). Perform using big movements.   Flipping Cards: Place deck of cards on the table. Flip cards over by opening your hand big to grasp and then turn your palm up big.  Deal cards: Hold 1/2 or whole deck in your hand. Use thumb to push card off top of deck with one big push.  Rotate ball with fingertips: Pick up with fingers/thumb and move as much as you can with each turn/movement (clockwise and counter-clockwise).  Pick up coins and place in coin bank or container: Pick up with big, intentional movements. Do not drag coin to the edge.  Pick up coins and stack one at a time: Pick up with big, intentional movements. Do not drag coin to the edge. (5-10 in a stack)  Pick up 5-10 coins one at a time and hold in palm. Then, move coins from palm to fingertips one at time and  place in coin bank/container.

## 2014-12-07 NOTE — Therapy (Signed)
[facility] [address] [place], [name], [address] Phone: [phone]   Fax:  [phone]  Speech Language Pathology Treatment  Patient Details  Name: Marne Meline MRN: [id number] Date of Birth: 12/07/1927 Referring Provider:  [name], MD  Encounter Date: 12/07/2014      End of Session - 12/07/14 1145    Visit Number 7   Number of Visits 16   Date for SLP Re-Evaluation 01/08/15   SLP Start Time 1104   SLP Stop Time  1144   SLP Time Calculation (min) 40 min   Activity Tolerance Patient tolerated treatment well      Past Medical History  Diagnosis Date  . Asthma   . Hearing loss   . Diabetes mellitus without complication     6/38/46.Marland KitchenMarland Kitchenpt denies  . GERD (gastroesophageal reflux disease)   . NHL (non-Hodgkin's lymphoma)     nhl dx 9/04 breast ca dx1/12    Past Surgical History  Procedure Laterality Date  . Mastectomy  2 /8/ 12    bilateral  . Ba-ha ear implant      There were no vitals filed for this visit.  Visit Diagnosis: Hypokinetic Parkinsonian dysphonia      Subjective Assessment - 12/07/14 1111    Subjective "I'm exhausted. We went for a day of doctors yesterday."               ADULT SLP TREATMENT - 12/07/14 1113    General Information   Behavior/Cognition Alert;Cooperative;Confused   Treatment Provided   Treatment provided Cognitive-Linquistic   Pain Assessment   Pain Assessment No/denies pain   Cognitive-Linquistic Treatment   Treatment focused on Dysarthria   Skilled Treatment Loud /a/ with good vocal quality average 88dB. Simple conversation re: appointments yesterday required SLP verbal cues occasionally - loudness was sub-70dB. Sentence task (explaining proverbs/expressions) with 70dB 85% of the time. Item descriptions (multiple sentence responses); pt remained with adequate loudenss (at or ablve 70dB) 95% of the time. As session progressed, pt success decr'd  to approx 90% of the time.Pt cleared throat x2 today, and reportedly drank water the remaining times she felt congestion.   Assessment / Recommendations / Plan   Plan Continue with current plan of care   Progression Toward Goals   Progression toward goals Progressing toward goals            SLP Short Term Goals - 12/07/14 1146    SLP SHORT TERM GOAL #1   Status Achieved   SLP SHORT TERM GOAL #2   Title Pt will maintain average of 72dB during structured speech tasks with occassional min A over 3 sessions (12/08/14)  modified due to consistency   Time 2   Period Weeks   Status Revised   SLP SHORT TERM GOAL #3   Title Pt will report using throat clear alternatives over 4 sessions and demonstrate less than 4 throat clears in a session with occassional min A (12/08/14)   Status Achieved          SLP Long Term Goals - 12/07/14 1147    SLP LONG TERM GOAL #1   Title Maintain an average of 70dB during conversation over 12 minutes with rare min A over two sessions (01/08/15)  revised due to consistency   Time 6   Period Weeks   Status On-going   SLP LONG TERM GOAL #2   Title Philis Nettle audilbly in a noisy environment for 10 minutes with rare min A (01/08/15)  Time 6   Period Weeks   Status On-going          Plan - 12/07/14 1146    Speech Therapy Frequency 2x / week   Duration --  6 weeks   Treatment/Interventions Compensatory strategies;Functional tasks;Patient/family education;Internal/external aids;SLP instruction and feedback   Potential to Achieve Goals Good   Potential Considerations Previous level of function        Problem List Patient Active Problem List   Diagnosis Date Noted  . Paralysis agitans 09/19/2012  . Lymphoma 07/03/2011  . Malignant neoplasm of breast (female), unspecified site 07/03/2011  . Abnormal CT of the chest 03/13/2011  . HYPERTENSION, BENIGN 01/01/2009  . EDEMA 01/01/2009    Ashanti Littles, MS- ccc-slp 12/07/2014, 11:48 AM  [name] [address] [place], [name], [address] Phone: [phone]   Fax:  [phone]

## 2014-12-11 ENCOUNTER — Encounter: Payer: Self-pay | Admitting: Occupational Therapy

## 2014-12-11 ENCOUNTER — Ambulatory Visit: Payer: Medicare Other

## 2014-12-11 ENCOUNTER — Ambulatory Visit: Payer: Medicare Other | Admitting: Physical Therapy

## 2014-12-11 ENCOUNTER — Ambulatory Visit: Payer: Medicare Other | Admitting: Occupational Therapy

## 2014-12-11 VITALS — BP 180/81 | HR 75

## 2014-12-11 DIAGNOSIS — R258 Other abnormal involuntary movements: Secondary | ICD-10-CM

## 2014-12-11 DIAGNOSIS — R279 Unspecified lack of coordination: Secondary | ICD-10-CM

## 2014-12-11 DIAGNOSIS — G20 Parkinson's disease: Secondary | ICD-10-CM

## 2014-12-11 DIAGNOSIS — R4189 Other symptoms and signs involving cognitive functions and awareness: Secondary | ICD-10-CM

## 2014-12-11 DIAGNOSIS — R269 Unspecified abnormalities of gait and mobility: Secondary | ICD-10-CM

## 2014-12-11 DIAGNOSIS — R490 Dysphonia: Secondary | ICD-10-CM

## 2014-12-11 DIAGNOSIS — R29898 Other symptoms and signs involving the musculoskeletal system: Secondary | ICD-10-CM | POA: Diagnosis not present

## 2014-12-11 NOTE — Therapy (Signed)
[facility] [address] [place], [name], [address] Phone: [phone]   Fax:  [phone]  Physical Therapy Treatment  Patient Details  Name: Michaeline Eckersley MRN: [id number] Date of Birth: [birth year] Referring Provider:  [name], MD  Encounter Date: 12/11/2014      PT End of Session - 12/11/14 2244    Visit Number 7   Number of Visits 9   Date for PT Re-Evaluation 01/08/15   PT Start Time 4270   PT Stop Time 1402   PT Time Calculation (min) 43 min   Equipment Utilized During Treatment Gait belt   Activity Tolerance Patient tolerated treatment well   Behavior During Therapy [facility] for tasks assessed/performed      Past Medical History  Diagnosis Date  . Asthma   . Hearing loss   . Diabetes mellitus without complication     01/17/75.Marland KitchenMarland Kitchenpt denies  . GERD (gastroesophageal reflux disease)   . NHL (non-Hodgkin's lymphoma)     nhl dx 9/04 breast ca dx1/12    Past Surgical History  Procedure Laterality Date  . Mastectomy  2 /8/ 12    bilateral  . Ba-ha ear implant      Filed Vitals:   12/11/14 1325  BP: 180/81  Pulse: 75    Visit Diagnosis:  Bradykinesia  Rigidity  Abnormality of gait      Subjective Assessment - 12/11/14 1324    Subjective Doctor put me back on my blood pressure medications yesterday; was worried it was too high.  Feel a bit more jittery, not as bad as before.   Currently in Pain? No/denies      Gait: Gait training no device x 3 minutes, 475 ft with min guard/supervision and cues for improved arm swing and posture.  Gait training no device x 3 minutes, 400 ft with environmental scanning.  Pt requires min guard assistance and has slowed gait pattern with environmental scanning tasks.  Discussed walking program with patient and with husband, with hopes for pt to progress to walking up to 15-20 minutes per day.   Neuro Re-education:  -Alternating step taps at 6" and  12" step with bilateral UE support; x 15 reps each leg. -Forward step up/up -down/down x 10 reps each foot leading with bilateral UE support. -In parallel bars-heel/toe raises x 20 reps then standing with feet apart with head turns/head nods x 5 reps each.  Standing on foam balance beam with head turns/head nods x 5 reps each; marching in place, forward kicks, and forward step taps x 10 reps each. -Standing on rockerboard-ankle/hip strategy work x 10 reps, standing balance on rockerboard with arm swing with supervision/min guard assistance.  Sit<>stand x 5 reps from 18 inch chair with UE support, cues for increased forward lean.                           PT Education - 12/11/14 2243    Education provided Yes   Education Details walking program progression, plans for D/C next visit   Person(s) Educated Patient;Spouse   Methods Explanation;Demonstration   Comprehension Verbalized understanding             PT Long Term Goals - 12/07/14 1230    PT LONG TERM GOAL #1   Title Pt will perform HEP with family supervision for improved balance, strength and gait. (4 wk Target 12/08/14)   Status Achieved   PT LONG TERM GOAL #2  Title Pt will improve Functional Gait Assessment to at least 20/30 for decreased fall risk.   Time 4   Period Weeks   Status New   PT LONG TERM GOAL #3   Title Pt will improve gait velocity backwards direction to at least 1 ft/sec for improved balance recovery in posterior direction.   Time 4   Period Weeks   Status New   PT LONG TERM GOAL #4   Title Pt will verbalize plans for continued community fitness upon D/C from PT.   Time 4   Period Weeks   Status New               Plan - 12/11/14 2244    Clinical Impression Statement Pt participates well in balance exercises this visit.  Pt feels she is ready for discharge next visit.  Discussed briefly with pt/husband continued walking activities upon D/C from PT as well as importance of  consistent HEP performance.   Pt will benefit from skilled therapeutic intervention in order to improve on the following deficits Abnormal gait;Decreased balance;Decreased mobility;Decreased strength;Postural dysfunction   Rehab Potential Good   PT Frequency 2x / week   PT Duration 4 weeks   PT Treatment/Interventions ADLs/Self Care Home Management;Gait training;Stair training;Functional mobility training;Therapeutic activities;Neuromuscular re-education;Balance training;Therapeutic exercise;Patient/family education   PT Next Visit Plan Check goals and plan for discharge next visit; G-Code for D/C; ?FOTO        Problem List Patient Active Problem List   Diagnosis Date Noted  . Paralysis agitans 09/19/2012  . Lymphoma 07/03/2011  . Malignant neoplasm of breast (female), unspecified site 07/03/2011  . Abnormal CT of the chest 03/13/2011  . HYPERTENSION, BENIGN 01/01/2009  . EDEMA 01/01/2009    Camille Thau W. 12/11/2014, 10:47 PM  Frazier Butt., PT  [facility] [address] [place], [name], [address] Phone: [phone]   Fax:  [phone]

## 2014-12-11 NOTE — Patient Instructions (Signed)
Walking Program:  Walk at least 3 minutes several times a day around your house.  Add 1-2 minutes every several days to work up to walking about 10 minutes at a time, either indoors or outdoors.  Work up to walking around the track at [employer] for 15-20 minutes, 3 times per week.

## 2014-12-11 NOTE — Patient Instructions (Signed)
  Please complete the assigned speech therapy homework prior to your next session.  

## 2014-12-11 NOTE — Therapy (Signed)
[facility] [address] [place], [name], [address] Phone: [phone]   Fax:  [phone]  Occupational Therapy Treatment  Patient Details  Name: [name] MRN: [id number] Date of Birth: 1928-04-26 Referring Provider:  [name], MD  Encounter Date: 12/11/2014      OT End of Session - 12/11/14 1454    Visit Number 7   Number of Visits 9   Date for OT Re-Evaluation 12/19/14   Authorization Type 1-[employer], 2-BCBS, G-code needed   Authorization Time Period cert period 9/44/96-7/59/16   Authorization - Visit Number 7   Authorization - Number of Visits 10   OT Start Time 1450   OT Stop Time 1530   OT Time Calculation (min) 40 min   Behavior During Therapy [facility] for tasks assessed/performed      Past Medical History  Diagnosis Date  . Asthma   . Hearing loss   . Diabetes mellitus without complication     3/84/66.Marland KitchenMarland Kitchenpt denies  . GERD (gastroesophageal reflux disease)   . NHL (non-Hodgkin's lymphoma)     nhl dx 9/04 breast ca dx1/12    Past Surgical History  Procedure Laterality Date  . Mastectomy  2 /8/ 12    bilateral  . Ba-ha ear implant      There were no vitals filed for this visit.  Visit Diagnosis:  Bradykinesia  Lack of coordination  Rigidity  Cognitive deficits      Subjective Assessment - 12/11/14 1453    Subjective  My doctor took me off my BP meds and then put me back on them.  "I haven't been doing my ex because we have a lot going on"   Patient Stated Goals improve ADLs   Currently in Pain? No/denies                      OT Treatments/Exercises (OP) - 12/11/14 0001    ADLs   Overall ADLs Began checking goals and discussing progress in prep for d/c next session.  Pt was going to leave due to forgetting OT appt until OT found pt in the lobby area.  Emphasized importance of continued exercise since activity tolerance is less than when she was seen by  OT in the past.  Pt verbalized understanding.   Functional Mobility Standing functional reach test:  R-10inches, L-7inch   Medication Management recommended use of memory strategy/alarm for meds, calendar for appts and review with husband, recommended pt discuss timing of nighttime PD meds with MD tomorrow.   ADL Comments Reviewed bag HEP for simulated ADLs with big movements with min v.c. x10 ea.   Fine Motor Coordination   Flipping cards min v.c. for big movements with BUEs   Dealing card with thumb min v.c.bilateral UE's for big movements   Neurological Re-education Exercises   Reciprocal Movements Arm bike x 4 min level 1 with min v.c. to maintain >40 rpms.  Pt maintained 38-45rpms but could not complete 11min and needed rest after 63min.                OT Education - 12/11/14 1517    Education Details memory strategies, use of big movements for ADLs, bag exercises   Person(s) Educated Patient   Methods Explanation;Demonstration;Verbal cues   Comprehension Verbalized understanding;Verbal cues required;Returned demonstration             OT Long Term Goals - 12/11/14 1502    OT LONG TERM GOAL #1  Title Pt will be independent with updated HEP.--Goals due 12/19/14   Time 4   Period Weeks   Status New   OT LONG TERM GOAL #2   Title Pt will improve R dominant hand coordination for ADLs as shown by improving 9-hole peg test score by at least 3sec.    Baseline R-28.82, L-25.72   Time 4   Period Weeks   Status Achieved  R-22.87sec   OT LONG TERM GOAL #3   Title Pt will verbalize understanding of updated strategies for ADLs/IADLs to incr ease/decrease time required.   Time 4   Period Weeks   Status Achieved  12/11/14   OT LONG TERM GOAL #4   Title Pt will improve balance/functional reaching for ADLs/IADLs as shown by improving RUE standing functional reach to at least 11 inches.   Baseline R-9, L-11 inches   Time 4   Period Weeks   Status New   OT LONG TERM GOAL #5    Title Pt will verbalize understanding of memory compensation strategies for ADLs prn.   Time 4   Period Weeks   Status Achieved  12/11/14               Plan - 12/11/14 1455    Clinical Impression Statement Pt continues to demo decr activity tolerance, but is not regularly exercising at home.  She is no longer walkng and is only doing HEP 2x/week.  Emphasized importance of exercise.  Also reviewed memory strategies as pt forgot OT appt today.   Pt will benefit from skilled therapeutic intervention in order to improve on the following deficits (Retired) Decreased balance;Decreased activity tolerance;Decreased coordination;Impaired tone;Decreased cognition;Decreased knowledge of use of DME;Decreased mobility;Impaired UE functional use   Rehab Potential Good   OT Frequency 2x / week   OT Duration 4 weeks   OT Treatment/Interventions Self-care/ADL training;Cryotherapy;Ultrasound;DME and/or AE instruction;Manual Therapy;Passive range of motion;Therapeutic activities;Balance training;Patient/family education;Therapeutic exercises;Functional Mobility Training;Energy conservation;Moist Heat;Fluidtherapy;Neuromuscular education;Splinting;Cognitive remediation/compensation;Therapeutic exercise   Plan check remaining goals and d/c, G-code, schedule screens in approx 6 months   OT Home Exercise Plan issued: PWR! supine 11/23/14, hand writing handout 11/23/14, big movements with ADLS handout- 10/30/25, memory strategies, handout issued, bag exercises (12/07/14), coord exercises 12/07/14   Consulted and Agree with Plan of Care Patient        Problem List Patient Active Problem List   Diagnosis Date Noted  . Paralysis agitans 09/19/2012  . Lymphoma 07/03/2011  . Malignant neoplasm of breast (female), unspecified site 07/03/2011  . Abnormal CT of the chest 03/13/2011  . HYPERTENSION, BENIGN 01/01/2009  . EDEMA 01/01/2009    [facility] 12/11/2014, 3:45 PM  [facility] [address] [place], [name], [address] Phone: [phone]   Fax:  [place], OTR/L 12/11/2014 3:45 PM

## 2014-12-12 NOTE — Therapy (Signed)
[facility] [address] [place], [name], [address] Phone: [phone]   Fax:  [phone]  Speech Language Pathology Treatment  Patient Details  Name: [name] MRN: [id number] Date of Birth: 07/09/1928 Referring Provider:  [name], MD  Encounter Date: 12/11/2014      End of Session - 12/11/14 1448    Visit Number 8   Number of Visits 16   Date for SLP Re-Evaluation 01/08/15   SLP Start Time 1404   SLP Stop Time  1446   SLP Time Calculation (min) 42 min   Activity Tolerance Patient tolerated treatment well      Past Medical History  Diagnosis Date  . Asthma   . Hearing loss   . Diabetes mellitus without complication     0/99/83.Marland KitchenMarland Kitchenpt denies  . GERD (gastroesophageal reflux disease)   . NHL (non-Hodgkin's lymphoma)     nhl dx 9/04 breast ca dx1/12    Past Surgical History  Procedure Laterality Date  . Mastectomy  2 /8/ 12    bilateral  . Ba-ha ear implant      There were no vitals filed for this visit.  Visit Diagnosis: Hypokinetic Parkinsonian dysphonia      Subjective Assessment - 12/11/14 1411    Subjective Pt's MD placed her back on BP meds due to pt's BP requiring it. Pt with Dr. [name] appointment Friday 12-14-14.               ADULT SLP TREATMENT - 12/11/14 1411    General Information   Behavior/Cognition Alert;Cooperative;Pleasant mood   Treatment Provided   Treatment provided Cognitive-Linquistic   Pain Assessment   Pain Assessment No/denies pain   Cognitive-Linquistic Treatment   Treatment focused on Dysarthria   Skilled Treatment Pt reports pleased with progress thus far in therapy. Loud /a/ performed with average 84dB. Sentence responses facilitated by SLP with average 71dB. SLP then facilitated multiple sentence answers with average 70dB with verbal cues for loudness rarely needed. As session progressed (last 7-10 minutes), pt req'd more frequent cues.    Assessment / Recommendations / Plan   Plan Continue with current plan of care   Progression Toward Goals   Progression toward goals Progressing toward goals            SLP Short Term Goals - 12/11/14 1449    SLP SHORT TERM GOAL #1   Status Achieved   SLP SHORT TERM GOAL #2   Title Pt will maintain average of 72dB during structured speech tasks with occassional min A over 3 sessions (12/08/14)  modified due to consistency   Time 1   Period Weeks   Status Revised   SLP SHORT TERM GOAL #3   Title Pt will report using throat clear alternatives over 4 sessions and demonstrate less than 4 throat clears in a session with occassional min A (12/08/14)   Status Achieved          SLP Long Term Goals - 12/11/14 1011    SLP LONG TERM GOAL #1   Title Maintain an average of 70dB during conversation over 12 minutes with rare min A over two sessions (01/08/15)  revised due to consistency   Time 6   Period Weeks   Status Achieved   SLP LONG TERM GOAL #2   Title Philis Nettle audilbly in a noisy environment for 10 minutes with rare min A (01/08/15)   Time 6   Period Weeks   Status On-going  Plan - 12/11/14 1449    Clinical Impression Statement Pt with presumed fatigue today at end of session. Pt may be d/c'd in next 1-3 weeks as she is pleased with progress thus far.   Speech Therapy Frequency 2x / week   Duration --  5 weeks, possible d/c prior to that time   Treatment/Interventions Compensatory strategies;Functional tasks;Patient/family education;Internal/external aids;SLP instruction and feedback   Potential to Achieve Goals Good   Potential Considerations Previous level of function        Problem List Patient Active Problem List   Diagnosis Date Noted  . Paralysis agitans 09/19/2012  . Lymphoma 07/03/2011  . Malignant neoplasm of breast (female), unspecified site 07/03/2011  . Abnormal CT of the chest 03/13/2011  . HYPERTENSION, BENIGN 01/01/2009  . EDEMA 01/01/2009     Debbe Crumble , MS, [place]  12/12/2014, 10:13 AM  [facility] [address] [place], [name], [address] Phone: [phone]   Fax:  [phone]

## 2014-12-13 ENCOUNTER — Ambulatory Visit: Payer: Medicare Other

## 2014-12-13 ENCOUNTER — Ambulatory Visit: Payer: Medicare Other | Admitting: Occupational Therapy

## 2014-12-13 ENCOUNTER — Encounter: Payer: Self-pay | Admitting: Occupational Therapy

## 2014-12-13 VITALS — BP 182/90 | HR 76

## 2014-12-13 DIAGNOSIS — R269 Unspecified abnormalities of gait and mobility: Secondary | ICD-10-CM | POA: Diagnosis not present

## 2014-12-13 DIAGNOSIS — G20 Parkinson's disease: Secondary | ICD-10-CM

## 2014-12-13 DIAGNOSIS — R490 Dysphonia: Secondary | ICD-10-CM

## 2014-12-13 DIAGNOSIS — R4189 Other symptoms and signs involving cognitive functions and awareness: Secondary | ICD-10-CM | POA: Diagnosis not present

## 2014-12-13 DIAGNOSIS — R279 Unspecified lack of coordination: Secondary | ICD-10-CM

## 2014-12-13 DIAGNOSIS — R258 Other abnormal involuntary movements: Secondary | ICD-10-CM

## 2014-12-13 DIAGNOSIS — R29898 Other symptoms and signs involving the musculoskeletal system: Secondary | ICD-10-CM

## 2014-12-13 NOTE — Therapy (Signed)
[facility] [address] , [place], [address] Phone: [phone]   Fax:  [phone]  Speech Language Pathology Treatment  Patient Details  Name: [name] MRN: [id number] Date of Birth: 04/03/1928 Referring Provider:  Harris, William, MD  Encounter Date: 12/13/2014      End of Session - 12/13/14 1436    Visit Number 9   Number of Visits 16   Date for SLP Re-Evaluation 01/08/15   SLP Start Time 1404   SLP Stop Time  1445   SLP Time Calculation (min) 41 min   Activity Tolerance Patient tolerated treatment well      Past Medical History  Diagnosis Date  . Asthma   . Hearing loss   . Diabetes mellitus without complication     09/19/12...pt denies  . GERD (gastroesophageal reflux disease)   . NHL (non-Hodgkin's lymphoma)     nhl dx 9/04 breast ca dx1/12    Past Surgical History  Procedure Laterality Date  . Mastectomy  2 /8/ 12    bilateral  . Ba-ha ear implant      There were no vitals filed for this visit.  Visit Diagnosis: Hypokinetic Parkinsonian dysphonia      Subjective Assessment - 12/13/14 1416    Subjective Pt very "shaky". Did not do homework (inferring/describing feelings) due to "Nothing coming to (her)." "Four more?!??!" (after first loud "ah")               ADULT SLP TREATMENT - 12/13/14 1411    General Information   Behavior/Cognition Alert;Cooperative;Pleasant mood   Treatment Provided   Treatment provided Cognitive-Linquistic   Pain Assessment   Pain Assessment No/denies pain   Cognitive-Linquistic Treatment   Treatment focused on Dysarthria   Skilled Treatment Loud /a/ average 85dB. Structured tasks with average 72dB with door open to ST room. At home, pt states her husband occasionally cues her to talk louder which pt is successful at increasing loudness appropriately approx 85% of the time. Pt and SLP spoke in conversations of  22 minutes and for 14  minutes, for SLP to assess loudness over time in mod complex conversation. Pt maintained at least 70dB over the conversation.     Assessment / Recommendations / Plan   Plan Discharge SLP treatment due to (comment)   Progression Toward Goals   Progression toward goals Goals met, education completed, patient discharged from SLP            SLP Short Term Goals - 12/13/14 1438    SLP SHORT TERM GOAL #1   Status Achieved   SLP SHORT TERM GOAL #2   Title Pt will maintain average of 72dB during structured speech tasks with occassional min A over 3 sessions (12/08/14)  modified due to consistency   Time 1   Period Weeks   Status Achieved   SLP SHORT TERM GOAL #3   Title Pt will report using throat clear alternatives over 4 sessions and demonstrate less than 4 throat clears in a session with occassional min A (12/08/14)   Status Achieved          SLP Long Term Goals - 12/13/14 1438    SLP LONG TERM GOAL #1   Title Maintain an average of 70dB during conversation over 12 minutes with rare min A over two sessions (01/08/15)  revised due to consistency   Time 6   Period Weeks   Status Achieved   SLP LONG TERM GOAL #2     Title [employer] in a noisy environment for 10 minutes with rare min A (01/08/15)   Time 6   Period Weeks   Status Achieved          Plan - Dec 21, 2014 1436    Clinical Impression Statement Pt maintained 70-71dB conversation for two conversations today approx 35 minutes total. SLP encouraged pt to cont with loud /a/.    Treatment/Interventions Compensatory strategies;Functional tasks;Patient/family education;Internal/external aids;SLP instruction and feedback   Potential to Achieve Goals Good   Potential Considerations Previous level of function          G-Codes - 2014-12-21 1443    Functional Limitations Motor speech   Motor Speech Goal Status [phone]) At least 20 percent but less than 40 percent impaired, limited or restricted   Motor Speech Goal Status  ([id number]) At least 1 percent but less than 20 percent impaired, limited or restricted     SPEECH THERAPY DISCHARGE SUMMARY  Visits from Start of Care: 9  Current functional level related to goals / functional outcomes: Pt met goals (see above). She reports husband occasionally has to ask her to repeat, however when doing so, is successful in communication approx 85% of the time. Pt reports being happy with progress.   Remaining deficits: Mild dysarthria, intermittent. Worse when fatigued    Education / Equipment: Need to cont loud /a/, necessity to cont speaking in louder speech.  Plan: Patient agrees to discharge.  Patient goals were met. Patient is being discharged due to meeting the stated rehab goals.  ?????       Problem List Patient Active Problem List   Diagnosis Date Noted  . Paralysis agitans 09/19/2012  . Lymphoma 07/03/2011  . Malignant neoplasm of breast (female), unspecified site 07/03/2011  . Abnormal CT of the chest 03/13/2011  . HYPERTENSION, BENIGN 01/01/2009  . EDEMA 01/01/2009    SCHINKE,CARL , MS, CCC-SLP  2014/12/21, 2:44 PM  [facility] [address] [place], [name], [address] Phone: [phone]   Fax:  [phone]

## 2014-12-13 NOTE — Therapy (Signed)
[facility] [address] [place], [name], [address] Phone: [phone]   Fax:  [phone]  Physical Therapy Treatment  Patient Details  Name: [name] MRN: [id number] Date of Birth: 1928-07-01 Referring Provider:  [name], MD  Encounter Date: 12/13/2014      PT End of Session - 12/13/14 1613    Visit Number 8   Number of Visits 9   Date for PT Re-Evaluation 01/08/15   PT Start Time 2395   PT Stop Time 3202   PT Time Calculation (min) 38 min      Past Medical History  Diagnosis Date  . Asthma   . Hearing loss   . Diabetes mellitus without complication     3/34/35.Marland KitchenMarland Kitchenpt denies  . GERD (gastroesophageal reflux disease)   . NHL (non-Hodgkin's lymphoma)     nhl dx 9/04 breast ca dx1/12    Past Surgical History  Procedure Laterality Date  . Mastectomy  2 /8/ 12    bilateral  . Ba-ha ear implant      Filed Vitals:   12/13/14 1535 12/13/14 1612  BP: 191/88 182/90  Pulse: 73 76    Visit Diagnosis:  Lack of coordination  Bradykinesia  Abnormality of gait      Subjective Assessment - 12/13/14 1532    Subjective Husband has been checking blood pressure daily and reporting any notable changes to the doctor. She just started BP medication again on Monday.   Currently in Pain? No/denies            [facility] PT Assessment - 12/13/14 0001    Functional Gait  Assessment   Gait assessed  Yes   Gait Level Surface Walks 20 ft in less than 5.5 sec, no assistive devices, good speed, no evidence for imbalance, normal gait pattern, deviates no more than 6 in outside of the 12 in walkway width.   Change in Gait Speed Able to smoothly change walking speed without loss of balance or gait deviation. Deviate no more than 6 in outside of the 12 in walkway width.   Gait with Horizontal Head Turns Performs head turns smoothly with slight change in gait velocity (eg, minor disruption to smooth gait  path), deviates 6-10 in outside 12 in walkway width, or uses an assistive device.   Gait with Vertical Head Turns Performs head turns with no change in gait. Deviates no more than 6 in outside 12 in walkway width.   Gait and Pivot Turn Pivot turns safely within 3 sec and stops quickly with no loss of balance.   Step Over Obstacle Is able to step over 2 stacked shoe boxes taped together (9 in total height) without changing gait speed. No evidence of imbalance.   Gait with Narrow Base of Support Ambulates less than 4 steps heel to toe or cannot perform without assistance.   Gait with Eyes Closed Cannot walk 20 ft without assistance, severe gait deviations or imbalance, deviates greater than 15 in outside 12 in walkway width or will not attempt task.   Ambulating Backwards Walks 20 ft, uses assistive device, slower speed, mild gait deviations, deviates 6-10 in outside 12 in walkway width.   Steps Alternating feet, must use rail.   Total Score 21       Gait training: Checked functional gait assessment, extra time required due to loud gym and pt unable to hear verbal cues  Backward walking gait speed also checked.  Negotiates stairs safely with reciprocal gait pattern  and single hand rail. Also able to ascend safely without hand rail but must have hand rail to descend safely.  Self care-checked BP 2x, it was elevated. Thoroughly educated pt and husband regarding high levels of 284 for systolic and 132 for diastolic as too high for any activity, and to contact physician if they rise this high. Also to monitor regularly if the systolic reaches 440 or diastolic reaches 90. Husband verbalized understanding and states he has been checking it daily and writing it down to show MD if needed. Also discussed pt's plans for walking plan post d/c as well as recommendation to perform PWR! HEP regularly. Also informed pt of progress toward goals.                            PT Long Term Goals  - 12/30/14 1545    PT LONG TERM GOAL #1   Title Pt will perform HEP with family supervision for improved balance, strength and gait. (4 wk Target 12/08/14)   Status Achieved   PT LONG TERM GOAL #2   Title Pt will improve Functional Gait Assessment to at least 20/30 for decreased fall risk.   Time 4   Period Weeks   Status Achieved  scored 21/30 on 2014/12/30   PT LONG TERM GOAL #3   Title Pt will improve gait velocity backwards direction to at least 1 ft/sec for improved balance recovery in posterior direction.   Time 4   Period Weeks   Status Achieved  1.12 ft/sec backwards on Dec 30, 2014   PT LONG TERM GOAL #4   Title Pt will verbalize plans for continued community fitness upon D/C from PT.   Time 4   Period Weeks   Status Achieved  walking at [employer]; using total gym at home; PWR! HEP               Plan - Dec 30, 2014 1614    Clinical Impression Statement Pt to d/c today. She met all long term goals. She continues to have hypertension (see vitals section). Pt's husband is monitoring vitals and keeping the physician updated regarding the hypertension. Pt plans to continue to exercise post d/c.   Consulted and Agree with Plan of Care Patient;Family member/caregiver          G-Codes - 2014/12/30 1616    Functional Assessment Tool Used FGA 21/30; Backwards gait velocity 1.82 ft/sec   Functional Limitation Mobility: Walking and moving around   Mobility: Walking and Moving Around Current Status [phone]) At least 1 percent but less than 20 percent impaired, limited or restricted   Mobility: Walking and Moving Around Goal Status [phone]) At least 1 percent but less than 20 percent impaired, limited or restricted      Problem List Patient Active Problem List   Diagnosis Date Noted  . Paralysis agitans 09/19/2012  . Lymphoma 07/03/2011  . Malignant neoplasm of breast (female), unspecified site 07/03/2011  . Abnormal CT of the chest 03/13/2011  . HYPERTENSION, BENIGN 01/01/2009  .  EDEMA 01/01/2009   Delrae Sawyers, PT,DPT,NCS 12/30/2014 4:23 PM Phone [phone] FAX [phone]         [facility] [address] [place], [name], [address] Phone: [phone]   Fax:  [phone]    PHYSICAL THERAPY DISCHARGE SUMMARY  Visits from Start of Care: 8  Current functional level related to goals / functional outcomes: Pt met all LTGs     PT  Long Term Goals - 12/13/14 1545    PT LONG TERM GOAL #1   Title Pt will perform HEP with family supervision for improved balance, strength and gait. (4 wk Target 12/08/14)   Status Achieved   PT LONG TERM GOAL #2   Title Pt will improve Functional Gait Assessment to at least 20/30 for decreased fall risk.   Time 4   Period Weeks   Status Achieved  scored 21/30 on 12/13/14   PT LONG TERM GOAL #3   Title Pt will improve gait velocity backwards direction to at least 1 ft/sec for improved balance recovery in posterior direction.   Time 4   Period Weeks   Status Achieved  1.12 ft/sec backwards on 12/13/14   PT LONG TERM GOAL #4   Title Pt will verbalize plans for continued community fitness upon D/C from PT.   Time 4   Period Weeks   Status Achieved  walking at [employer]; using total gym at home; PWR! HEP        Remaining deficits: Bradykinesia, mild balance impairment   Education / Equipment: HEP, monitoring blood pressure  Plan: Patient agrees to discharge.  Patient goals were met. Patient is being discharged due to meeting the stated rehab goals.  ?????

## 2014-12-13 NOTE — Therapy (Signed)
[facility] [address] [place], [name], [address] Phone: [phone]   Fax:  [phone]  Occupational Therapy Treatment  Patient Details  Name: [name] MRN: [id number] Date of Birth: June 25, 1928 Referring Provider:  [name], MD  Encounter Date: 12/13/2014      OT End of Session - 12/13/14 1459    Visit Number 8   Number of Visits 9   Date for OT Re-Evaluation 12/19/14   Authorization Type 1-[employer], 2-[employer], G-code needed   Authorization Time Period cert period 5/91/63-8/46/65   Authorization - Visit Number 8   Authorization - Number of Visits 10   OT Start Time 1452   OT Stop Time 1530   OT Time Calculation (min) 38 min   Behavior During Therapy [facility] for tasks assessed/performed      Past Medical History  Diagnosis Date  . Asthma   . Hearing loss   . Diabetes mellitus without complication     9/93/57.Marland KitchenMarland Kitchenpt denies  . GERD (gastroesophageal reflux disease)   . NHL (non-Hodgkin's lymphoma)     nhl dx 9/04 breast ca dx1/12    Past Surgical History  Procedure Laterality Date  . Mastectomy  2 /8/ 12    bilateral  . Ba-ha ear implant      There were no vitals filed for this visit.  Visit Diagnosis:  Bradykinesia  Lack of coordination  Rigidity  Cognitive deficits      Subjective Assessment - 12/13/14 1457    Subjective  I have my appt with Dr. [name] tomorrow.   Currently in Pain? No/denies                      OT Treatments/Exercises (OP) - 12/13/14 0001    ADLs   Overall ADLs Pt/husband instructed in activities to promote cognitive skills.  Pt/husband verbalized understanding.   ADL Comments Checked remaining goals and discussed progress.  Recommended therapy screens in approx 6 months.  Pt/husband verbalized understanding.   Fine Motor Coordination   Fine Motor Coordination Small Pegboard   Small Pegboard copying small peg design for cognitive  component with min-mod difficulty and incr timeand min cues; min difficulty with coordination with each hand.   Stacking coins picking up and manipulating coins in each hand to stack with min difficulty                OT Education - 12/13/14 1659    Education Details importance of exercise   Person(s) Educated Patient;Spouse   Methods Explanation   Comprehension Verbalized understanding             OT Long Term Goals - 12/13/14 1458    OT LONG TERM GOAL #1   Title Pt will be independent with updated HEP.--Goals due 12/19/14   Time 4   Period Weeks   Status Achieved  12/13/14   OT LONG TERM GOAL #2   Title Pt will improve R dominant hand coordination for ADLs as shown by improving 9-hole peg test score by at least 3sec.    Baseline R-28.82, L-25.72   Time 4   Period Weeks   Status Achieved  R-22.87sec   OT LONG TERM GOAL #3   Title Pt will verbalize understanding of updated strategies for ADLs/IADLs to incr ease/decrease time required.   Time 4   Period Weeks   Status Achieved  12/11/14   OT LONG TERM GOAL #4   Title Pt will improve balance/functional reaching  for ADLs/IADLs as shown by improving RUE standing functional reach to at least 11 inches.   Baseline R-9, L-11 inches   Time 4   Period Weeks   Status Not Met  January 04, 2015:  R-10, L-10 inches.   OT LONG TERM GOAL #5   Title Pt will verbalize understanding of memory compensation strategies for ADLs prn.   Time 4   Period Weeks   Status Achieved  12/11/14               Plan - 04-Jan-2015 1459    Clinical Impression Statement Cognitive deficits continue to be a barrier.  Emphasized importance of carryover at home as pt reports minimal HEP performance.   Pt will benefit from skilled therapeutic intervention in order to improve on the following deficits (Retired) Decreased balance;Decreased activity tolerance;Decreased coordination;Impaired tone;Decreased cognition;Decreased knowledge of use of  DME;Decreased mobility;Impaired UE functional use   Rehab Potential Good   OT Frequency 2x / week   OT Duration 4 weeks   OT Treatment/Interventions Self-care/ADL training;Cryotherapy;Ultrasound;DME and/or AE instruction;Manual Therapy;Passive range of motion;Therapeutic activities;Balance training;Patient/family education;Therapeutic exercises;Functional Mobility Training;Energy conservation;Moist Heat;Fluidtherapy;Neuromuscular education;Splinting;Cognitive remediation/compensation;Therapeutic exercise   Plan d/c, shedule therapy screens in approx 6-20month   OT Home Exercise Plan issued: PWR! supine 11/23/14, hand writing handout 11/23/14, big movements with ADLS handout- 50/8/65 memory strategies, handout issued, bag exercises (12/07/14), coord exercises 12/07/14   Consulted and Agree with Plan of Care Patient          G-Codes - 006/10/20161507    Functional Assessment Tool Used verbalized understanding of updated HEP/strategies for ADLs/IADLs, Standing functional reach test:  R-10inches, L-10inches   Functional Limitation Self care   Self Care Goal Status (([id number] At least 1 percent but less than 20 percent impaired, limited or restricted   Self Care Discharge Status ([phone] At least 1 percent but less than 20 percent impaired, limited or restricted     OCCUPATIONAL THERAPY DISCHARGE SUMMARY  Remaining deficits: Bradykinesia, rigidity, decreased coordination, decreased balance for ADLs, cognitive deficits   Education / Equipment: Updated HEP and importance of continued, regular exercise, strategies for ADLs.  Pt verbalized understanding of all education provided.  Plan: Patient agrees to discharge.  Patient goals were partially met. Patient is being discharged due to                                                     Reaching maximal rehab potential at this time.  Pt would benefit from re-evaluation/occupational therapy screen in approx 6 months to assess for need for further  therapy/functional changes due to progressive nature of diagnosis. ?????       Problem List Patient Active Problem List   Diagnosis Date Noted  . Paralysis agitans 09/19/2012  . Lymphoma 07/03/2011  . Malignant neoplasm of breast (female), unspecified site 07/03/2011  . Abnormal CT of the chest 03/13/2011  . HYPERTENSION, BENIGN 01/01/2009  . EDEMA 01/01/2009    [facility]510-Jun-2016 5:04 PM  [place] [address]Phone: [phone]  Fax:  3[place] OTR/L 006/10/165:04 PM

## 2014-12-14 ENCOUNTER — Ambulatory Visit (INDEPENDENT_AMBULATORY_CARE_PROVIDER_SITE_OTHER): Payer: Medicare Other | Admitting: Neurology

## 2014-12-14 ENCOUNTER — Encounter: Payer: Self-pay | Admitting: Neurology

## 2014-12-14 VITALS — Ht 63.0 in | Wt 111.0 lb

## 2014-12-14 DIAGNOSIS — I10 Essential (primary) hypertension: Secondary | ICD-10-CM | POA: Diagnosis not present

## 2014-12-14 DIAGNOSIS — R634 Abnormal weight loss: Secondary | ICD-10-CM

## 2014-12-14 DIAGNOSIS — G20 Parkinson's disease: Secondary | ICD-10-CM

## 2014-12-14 NOTE — Progress Notes (Signed)
[name] was seen today in the movement disorders clinic for neurologic consultation at the request of Dr. Betsy Coder.    The consultation is for the evaluation of ataxia and tremor.  The patient has been a previous pt of Dr. Erling Cruz.  Dr. Erling Cruz dx the patient with parkinsonism and ET.  This is an 81 y/o female with a complex medical hx of both non Hodgkins lymphoma require chemotherapy and breast CA, s/p bilateral mastectomy.  She has a long hx of tremor.  I reviewed Dr. Bernardo Heater notes that were made available to me.  It appears that she presented with right hand resting tremor in 2009, without associated rigidity or bradykinesia.  There were no known medications causing the problem and it appears that she was initially dx with ET and later transitioned the dx to parkinsonism.  The patient reports no tremor in the L hand.  Her husband reports a little tremor in the R leg.  The patient reports that tremor is most significant with relaxing and she has no tremor if her hands are preoccupied.    She has never been on PD medication or been to PT until yesterday.  03/16/13 update:  The pt presents today with her husband.  She has not been seen since February, 2014 at which time I dx her with PD.  She was started on levodopa 25/100 three times per day.  She did not follow up sooner as she had bronchitis and pneumonia for the last 2 months and is just now recovering.  She did go to PT and liked it but would like to go back.  She is doing well on the levodopa.  Her husband rarely notice tremor any longer.  06/28/13 update:    The pt today presents for f/u with her husband who supplements the hx.  She is on carbidopa/levodopa 25/100 three times per day.  She is doing well but notices R arm tremor now that had gone away.  Rarely she will note tremor in the L hand which she had not noted previously.  She does have some cramping of the feet.  She is unsure if this is associated with wearing off.  She did have some  of this while in my office, and it was time for her to redose the carbidopa/levodopa.  She is now attending therapy, and is enjoying and finding benefit from it.  08/08/12 update:  The patient presents today with her husband, who supplements the history.  The patient has Parkinson's disease and is currently on carbidopa/levodopa 25/100, one tablet 3 times per day.  Last visit, she was describing some cramping in her feet and I was trying to figure out whether or not this was an "off" phenomenon.  After paying attention, the patient called and said that she thought it was and we added entacapone.  The cramping in the feet is better but she is now having a stabbing and shooting pain down the back of the leg and to the lateral side of the lower leg that stops at the ankle.  It has stopped her from exercising.  She sometimes has back pain.  This is fairly inconsistent.  The leg pain seems to start about 4 days after she finished rehabilitation, which did seem to help.  She is not sure if the pain is related to the rehabilitation, however.  10/03/13 update:  The patient presents today with her husband, who supplements the history.  The patient is currently on carbidopa/levodopa 25/100, one  tablet 3 times per day.  She is also on Comtan 200 mg, one tablet 3 times per day.  From a Parkinson's standpoint, she has been doing well.  No falls.  No hallucinations.  Her husband rarely sees tremor, and that is primarily when she is in bed at night and he will feel it rather than see it.  Last visit, she was complaining of back pain that was likely sciatica.  I ordered an EMG but they ultimately canceled that.  I tried her on Lyrica but that caused her to be excessively sleepy.  She went to the chiropractor and states that she is doing much better from that regard.  Unfortunately, she has had bronchitis and a urinary tract infection.  She has been on several antibiotics, which have caused her to be nauseated.  She has also had  dental work and had several of her teeth pulled.  Because of these things, she has had a decreased appetite and really has not been able to eat.  She had a temporary partial placed, but her appetite still hasn't been what it used to be, primarily because of the nausea from the antibiotic.  She has not been exercising because of being sick.  She is hoping to get back to that soon.  02/02/14 update:  The patient presents today with her husband, who supplements the history.  The patient is currently on carbidopa/levodopa 25/100, 1 tablet at 9 AM/1 PM and 2 tablets at 6 PM.   She is also on Comtan 200 mg, one tablet 3 times per day.   The discomfort in the foot is much better.  From a Parkinson's standpoint, she has been doing well.  No falls.  No hallucinations.  The patient was screened through our Parkinson's screening program on 01/16/2014 and was either at baseline or better than baseline at all therapies and is scheduled for re-screening in January.  She is not currently exercising.    06/05/14 update:  The patient presents today with her husband, who supplements the history.  The patient is currently on carbidopa/levodopa 25/100, 1 tablet at 9 AM/1 PM and 2 tablets at 6 PM.   She is also on Comtan 200 mg, one tablet 3 times per day.  No falls.  No lightheadedness or near syncope.  No hallucinations.   I reviewed records since last visit.  Weight loss has been a concern.  The dietician has talked with her about suggestions to help, and the patient states that she was told to take Carnation 3 times per day.  Pt states that the biggest issue that has caused weight loss has been persistent nausea, which has been going on for about 2 months.  They really do not think that it is related to the Parkinson's medications, because it did not start with changes in Parkinson's medications.  It actually seemed to start after a visit to a restaurant and then persisted.  She just feels nauseated, has lost taste for food and  has lost appetite.  She followed up with Dr. Kenton Kingfisher and was started on Zantac about 3 or 4 days ago and does feel better and her blood pressure medication was discontinued.  10/04/14 update:  The patient presents today with her husband, who supplements the history.  The patient is currently on carbidopa/levodopa 25/100, 1 tablet at 9 AM/1 PM and 2 tablets at 6 PM.   She is also on Comtan 200 mg, one tablet 3 times per day. We d/c her comtan  for a week to see if her GI upset changed at all and it did not so we restarted it since obviously that was not the etiology.  She continues to have GI upset in the AM.  She went to her PCP and she was given Zofran and states that it made it worse but she only took it once.  It is only the AM when she has nausea and she does better the rest of the day.    12/14/14 update:  The patient returns today, accompanied by her husband who supplements the history.  Records were reviewed since last visit.  She has a history of breast cancer and was having weight loss/anorexia and saw her oncologist.  She ended up having a CT chest, abdomen and pelvis and those were unremarkable for any new lesions.  She has lost about 13 pounds since March of last year.  She has been complaining about nausea the last several visits.  In fact, we've previously stopped her entacapone to see if that would help the nausea, but it did not.  She did not think that it was related at all to her levodopa.  She no longer has the nausea but in the AM she doesn't feel good.  She has difficulty describing what this means.  She remains on carbidopa/levodopa 25/100, 1 tablet at 9 AM/one tablet at 1 PM and 2 tablets at 6 PM.  She is still on entacapone, 200 mg 3 times a day.  They brought with them a list of blood pressures and while the SBP is generally high (170's-190's without med and 150's-180's with BP med) it was never over 200.  She states that she was off the BP med a few days and felt better but when she went  back on it she felt worse again.  She was told to go back on it because it her BP's were too high.  She is only on amlodipine 2.5 mg.     Neuroimaging has  previously been performed.  An MRI brain was last done in 2010 but pt can no longer have MRI's because of an implantable hearing device.    PREVIOUS MEDICATIONS: none to date  ALLERGIES:  No Known Allergies  CURRENT MEDICATIONS:  Current Outpatient Prescriptions on File Prior to Visit  Medication Sig Dispense Refill  . acetaminophen (TYLENOL) 325 MG tablet Take 650 mg by mouth every 6 (six) hours as needed.    [name] acyclovir (ZOVIRAX) 400 MG tablet TAKE 1 TABLET BY MOUTH 2 TIMES DAILY 60 tablet 3  . ALPRAZolam (XANAX) 0.25 MG tablet Take 0.25 mg by mouth as needed for anxiety. Patient taking approx 2 times weekly    . amLODipine (NORVASC) 2.5 MG tablet Take 2.5 mg by mouth daily.    [name] anastrozole (ARIMIDEX) 1 MG tablet Take 1 tablet (1 mg total) by mouth daily. 90 tablet 3  . Calcium Carbonate-Vitamin D (CALCIUM 600 + D PO) Take 2 capsules by mouth daily.     . carbidopa-levodopa (SINEMET IR) 25-100 MG per tablet one tablet at 9 AM/one tablet at 1 PM and 2 tablets at 6 PM (Patient taking differently: 1 tablet 4 (four) times daily. ) 360 tablet 3  . cetirizine (ZYRTEC) 10 MG tablet Take 10 mg by mouth daily.    . entacapone (COMTAN) 200 MG tablet TAKE 1 TABLET (200 MG TOTAL) BY MOUTH 3 (THREE) TIMES DAILY. 270 tablet 3  . Fluticasone-Salmeterol (ADVAIR DISKUS) 100-50 MCG/DOSE AEPB Inhale 1 puff into  the lungs every 12 (twelve) hours.     . Multiple Vitamins-Minerals (MULTIVITAMIN WITH MINERALS) tablet Take 1 tablet by mouth daily.      . ondansetron (ZOFRAN) 8 MG tablet Take by mouth every 8 (eight) hours as needed for nausea or vomiting.    [name] oxybutynin (DITROPAN-XL) 10 MG 24 hr tablet Take 10 mg by mouth at bedtime.    . ranitidine (ZANTAC) 300 MG tablet Take 300 mg by mouth 2 (two) times daily.    [name] zinc gluconate 50 MG tablet Take 50 mg  by mouth daily.     No current facility-administered medications on file prior to visit.    PAST MEDICAL HISTORY:   Past Medical History  Diagnosis Date  . Asthma   . Hearing loss   . Diabetes mellitus without complication     11/03/79.Marland KitchenMarland Kitchenpt denies  . GERD (gastroesophageal reflux disease)   . NHL (non-Hodgkin's lymphoma)     n[facility] dx 9/04 breast ca dx1/12    PAST SURGICAL HISTORY:   Past Surgical History  Procedure Laterality Date  . Mastectomy  2 /8/ 12    bilateral  . Ba-ha ear implant      SOCIAL HISTORY:   History   Social History  . Marital Status: Married    Spouse Name: N/A  . Number of Children: N/A  . Years of Education: N/A   Occupational History  . retired     Pharmacist, hospital   Social History Main Topics  . Smoking status: Never Smoker   . Smokeless tobacco: Never Used     Comment: husband wsa smoker  . Alcohol Use: No     Comment: no  . Drug Use: No  . Sexual Activity: Not on file   Other Topics Concern  . Not on file   Social History Narrative    FAMILY HISTORY:   Family Status  Relation Status Death Age  . Father Deceased     CA, lung  . Mother Deceased     CA; complications of splenectomy  . Brother Deceased     3, renal failure, DM-2; CAD; CA  . Child Alive     5 (4 biological), one with PKU    ROS:  A complete 10 system review of systems was obtained and was unremarkable apart from what is mentioned above.  PHYSICAL EXAMINATION:    VITALS:   Filed Vitals:   12/14/14 1044  Height: 5\' 3"  (1.6 m)  Weight: 111 lb (50.349 kg)   Wt Readings from Last 3 Encounters:  12/14/14 111 lb (50.349 kg)  12/05/14 112 lb 11.2 oz (51.12 kg)  11/22/14 113 lb (51.256 kg)     GEN:  The patient appears stated age and is in NAD. CV:  RRR with 3/6 SEM Lungs:  CTAB Neck:  No bruits.     Neurological examination:  Orientation:  Montreal Cognitive Assessment  12/14/2014  Visuospatial/ Executive (0/5) 3  Naming (0/3) 3  Attention: Read list  of digits (0/2) 2  Attention: Read list of letters (0/1) 1  Attention: Serial 7 subtraction starting at 100 (0/3) 0  Language: Repeat phrase (0/2) 1  Language : Fluency (0/1) 1  Abstraction (0/2) 1  Delayed Recall (0/5) 0  Orientation (0/6) 6  Total 18  Adjusted Score (based on education) 19   Cranial nerves: There is good facial symmetry.  The visual fields are full to confrontational testing. The speech is fluent and clear. Soft palate rises symmetrically and there  is no tongue deviation. Hearing is intact to conversational tone. Sensation: Sensation is intact to light outh throughout. Motor: Strength is 5/5 in the bilateral upper and lower extremities.   Shoulder shrug is equal and symmetric.  There is no pronator drift.   Movement examination: Tone: There is no rigidity. Abnormal movements: There is no tremor noted today Coordination:  There is no decremation noted today Gait and Station: The patient no has trouble arising out of the chair without use of her hands today.  Stride length and arm swing are good. Labs:  Lab Results  Component Value Date   WBC 4.7 11/22/2014   HGB 11.7 11/22/2014   HCT 34.7* 11/22/2014   MCV 89.2 11/22/2014   PLT 122* 11/22/2014     Chemistry      Component Value Date/Time   NA 143 11/22/2014 1044   NA 139 09/20/2012 0058   K 4.2 11/22/2014 1044   K 4.2 09/20/2012 0058   CL 106 12/26/2012 1110   CL 106 09/20/2012 0058   CO2 25 11/22/2014 1044   CO2 31 11/27/2011 1526   BUN 13.9 11/22/2014 1044   BUN 24* 09/20/2012 0058   CREATININE 1.2* 11/22/2014 1044   CREATININE 1.10 09/20/2012 0058      Component Value Date/Time   CALCIUM 9.1 11/22/2014 1044   CALCIUM 9.0 11/27/2011 1526   CALCIUM 11.1* 07/15/2006 1212   ALKPHOS 76 11/22/2014 1044   ALKPHOS 89 11/27/2011 1526   AST 19 11/22/2014 1044   AST 19 11/27/2011 1526   ALT 7 11/22/2014 1044   ALT 16 11/27/2011 1526   BILITOT 0.42 11/22/2014 1044   BILITOT 0.2* 11/27/2011 1526       Lab Results  Component Value Date   VITAMINB12 1053* 06/10/2006   Lab Results  Component Value Date   TSH 2.93 09/19/2012    ASSESSMENT/PLAN:  1.  Idiopathic Parkinson's disease, dx 08/2012.  This is evidenced by bradykinesia, tremor, postural instability and rigidity.  There are no atypical features.  -We discussed the diagnosis as well as pathophysiology of the disease.  We discussed treatment options as well as prognostic indicators.  Patient education was provided.  - she is currently on carbidopa/levodopa 25/100 one tablet at 9 AM/one tablet at 1 PM and 2 tablets at 6 PM and will continue this.  Risks, benefits, side effects and alternative therapies were discussed.  The opportunity to ask questions was given and they were answered to the best of my ability.  The patient expressed understanding and willingness to follow the outlined treatment protocols.  -Pt is on entacapone tid and she will continue on this.  However, after detailed conversation with her, I realized that she was not taking entacapone (the last dose) at the same time as the levodopa and I told her she needed to take them together.  I also talked to her extensively about keeping protein/her Carnation drinks away from the levodopa timing.  Much greater than 50% of this 60 minute visit spent in counseling with the patient and her husband.  -Her husband asked me about potentially adding carbidopa/levodopa 50/200 at night to see if that would make her mornings better.  I really did not want to do that until they get this blood pressure issue worked out, as the patient felt better when she was off of her blood pressure medication.  Therefore, I'm not really sure that this is a dopamine issue at night/early morning.  I did offer to stop the levodopa  altogether for a week and see how she did, but they did not want to do that. 2.  AM nausea with weight loss  -She has been worked up extensively for etiologies.  No evidence of a  return of her breast cancer.  We have tried to stop her entacapone to see if that would help the nausea but it did not.  She does not think it is related to her levodopa.  The nausea has actually gone away but she states that in the morning she just does not feel "good."  She relates that her blood pressure medication now.  She stopped it for one week and actually felt better, but her blood pressures were trending higher so she was restarted on it.  She asks me about that today.  I told her that would be up to her primary care physician as to what to do, but in general, we do that our Parkinson's patients run a wider range of blood pressures than the general population and 10 not to treat quite as aggressively.  She is going to follow-up with her primary care physician in this regard  -Encouraged her again to restart exercise.  Pt education provided. 3. F/u 3-4 months, sooner should new neuro issues arise.

## 2014-12-17 ENCOUNTER — Telehealth: Payer: Self-pay | Admitting: *Deleted

## 2014-12-17 NOTE — Telephone Encounter (Signed)
Patients husband needs you to call him in reference to this patient Call back number [phone]

## 2014-12-17 NOTE — Telephone Encounter (Signed)
Called back at number provided with no answer and no way to leave voicemail.

## 2014-12-17 NOTE — Telephone Encounter (Signed)
Spoke with patient's husband and just wanted to confirm that she should be taking Levodopa 30 minutes before meals with protein. Confirmed this with patient's husband. They will call as needed.

## 2014-12-21 ENCOUNTER — Encounter: Payer: Self-pay | Admitting: Nutrition

## 2014-12-21 NOTE — Progress Notes (Signed)
Patient called requesting I mail coupons for CIB to her home. I confirmed address with patient and mailed coupons today.

## 2014-12-31 ENCOUNTER — Other Ambulatory Visit: Payer: Self-pay | Admitting: Oncology

## 2015-01-01 ENCOUNTER — Other Ambulatory Visit: Payer: Self-pay | Admitting: Neurology

## 2015-01-01 MED ORDER — ENTACAPONE 200 MG PO TABS: ORAL_TABLET | ORAL | Status: DC

## 2015-01-01 NOTE — Telephone Encounter (Signed)
Comtan refill requested. Per last office note- patient to remain on medication. Refill approved and sent to patient's pharmacy.

## 2015-01-16 DIAGNOSIS — J209 Acute bronchitis, unspecified: Secondary | ICD-10-CM | POA: Diagnosis not present

## 2015-01-19 ENCOUNTER — Other Ambulatory Visit: Payer: Self-pay | Admitting: Oncology

## 2015-01-24 DIAGNOSIS — H26491 Other secondary cataract, right eye: Secondary | ICD-10-CM | POA: Diagnosis not present

## 2015-01-30 ENCOUNTER — Ambulatory Visit: Payer: Medicare Other | Admitting: Neurology

## 2015-02-01 DIAGNOSIS — F329 Major depressive disorder, single episode, unspecified: Secondary | ICD-10-CM | POA: Diagnosis not present

## 2015-02-01 DIAGNOSIS — I10 Essential (primary) hypertension: Secondary | ICD-10-CM | POA: Diagnosis not present

## 2015-02-07 ENCOUNTER — Emergency Department (HOSPITAL_COMMUNITY): Payer: Medicare Other

## 2015-02-07 ENCOUNTER — Ambulatory Visit (HOSPITAL_COMMUNITY): Admission: RE | Admit: 2015-02-07 | Payer: Medicare Other | Source: Ambulatory Visit

## 2015-02-07 ENCOUNTER — Encounter (HOSPITAL_COMMUNITY): Payer: Self-pay | Admitting: *Deleted

## 2015-02-07 ENCOUNTER — Emergency Department (HOSPITAL_COMMUNITY)
Admission: EM | Admit: 2015-02-07 | Discharge: 2015-02-07 | Disposition: A | Discharge status: Home / Self Care | Payer: Medicare Other | Attending: Emergency Medicine | Admitting: Emergency Medicine

## 2015-02-07 DIAGNOSIS — R0602 Shortness of breath: Secondary | ICD-10-CM | POA: Diagnosis present

## 2015-02-07 DIAGNOSIS — Z8572 Personal history of non-Hodgkin lymphomas: Secondary | ICD-10-CM | POA: Insufficient documentation

## 2015-02-07 DIAGNOSIS — J45901 Unspecified asthma with (acute) exacerbation: Secondary | ICD-10-CM | POA: Diagnosis not present

## 2015-02-07 DIAGNOSIS — K219 Gastro-esophageal reflux disease without esophagitis: Secondary | ICD-10-CM | POA: Insufficient documentation

## 2015-02-07 DIAGNOSIS — H9190 Unspecified hearing loss, unspecified ear: Secondary | ICD-10-CM | POA: Diagnosis not present

## 2015-02-07 DIAGNOSIS — E119 Type 2 diabetes mellitus without complications: Secondary | ICD-10-CM | POA: Insufficient documentation

## 2015-02-07 DIAGNOSIS — Z79899 Other long term (current) drug therapy: Secondary | ICD-10-CM | POA: Insufficient documentation

## 2015-02-07 DIAGNOSIS — R0600 Dyspnea, unspecified: Secondary | ICD-10-CM

## 2015-02-07 DIAGNOSIS — R011 Cardiac murmur, unspecified: Secondary | ICD-10-CM | POA: Insufficient documentation

## 2015-02-07 LAB — I-STAT TROPONIN, ED
Troponin i, poc: 0.01 ng/mL (ref 0.00–0.08)
Troponin i, poc: 0.02 ng/mL (ref 0.00–0.08)

## 2015-02-07 LAB — COMPREHENSIVE METABOLIC PANEL
ALK PHOS: 88 U/L (ref 38–126)
ALT: 8 U/L — ABNORMAL LOW (ref 14–54)
ANION GAP: 7 (ref 5–15)
AST: 40 U/L (ref 15–41)
Albumin: 3.8 g/dL (ref 3.5–5.0)
BUN: 16 mg/dL (ref 6–20)
CALCIUM: 9 mg/dL (ref 8.9–10.3)
CO2: 27 mmol/L (ref 22–32)
CREATININE: 1.24 mg/dL — AB (ref 0.44–1.00)
Chloride: 105 mmol/L (ref 101–111)
GFR calc Af Amer: 45 mL/min — ABNORMAL LOW (ref >60)
GFR, EST NON AFRICAN AMERICAN: 38 mL/min — AB (ref >60)
Glucose, Bld: 105 mg/dL — ABNORMAL HIGH (ref 65–99)
POTASSIUM: 4.2 mmol/L (ref 3.5–5.1)
Sodium: 139 mmol/L (ref 135–145)
TOTAL PROTEIN: 6.5 g/dL (ref 6.5–8.1)
Total Bilirubin: 1.1 mg/dL (ref 0.3–1.2)

## 2015-02-07 LAB — CBC WITH DIFFERENTIAL/PLATELET
Basophils Absolute: 0 10*3/uL (ref 0.0–0.1)
Basophils Relative: 1 % (ref 0–1)
Eosinophils Absolute: 0.1 10*3/uL (ref 0.0–0.7)
Eosinophils Relative: 1 % (ref 0–5)
HCT: 31.8 % — ABNORMAL LOW (ref 36.0–46.0)
Hemoglobin: 10.8 g/dL — ABNORMAL LOW (ref 12.0–15.0)
LYMPHS ABS: 0.9 10*3/uL (ref 0.7–4.0)
Lymphocytes Relative: 21 % (ref 12–46)
MCH: 29.8 pg (ref 26.0–34.0)
MCHC: 34 g/dL (ref 30.0–36.0)
MCV: 87.8 fL (ref 78.0–100.0)
MONO ABS: 0.4 10*3/uL (ref 0.1–1.0)
Monocytes Relative: 9 % (ref 3–12)
NEUTROS ABS: 3 10*3/uL (ref 1.7–7.7)
Neutrophils Relative %: 68 % (ref 43–77)
PLATELETS: 183 10*3/uL (ref 150–400)
RBC: 3.62 MIL/uL — AB (ref 3.87–5.11)
RDW: 12.7 % (ref 11.5–15.5)
WBC: 4.4 10*3/uL (ref 4.0–10.5)

## 2015-02-07 LAB — D-DIMER, QUANTITATIVE: D-Dimer, Quant: 1.27 ug/mL-FEU — ABNORMAL HIGH (ref 0.00–0.48)

## 2015-02-07 LAB — I-STAT CG4 LACTIC ACID, ED
Lactic Acid, Venous: 1.55 mmol/L (ref 0.5–2.0)
Lactic Acid, Venous: 0.71 mmol/L (ref 0.5–2.0)

## 2015-02-07 LAB — BRAIN NATRIURETIC PEPTIDE: B Natriuretic Peptide: 322.7 pg/mL — ABNORMAL HIGH (ref 0.0–100.0)

## 2015-02-07 MED ORDER — TECHNETIUM TC 99M DIETHYLENETRIAME-PENTAACETIC ACID: 41.0000 | Freq: Once | INTRAVENOUS | Status: DC | PRN

## 2015-02-07 MED ORDER — SODIUM CHLORIDE 0.9 % IV BOLUS (SEPSIS)
1000.0000 mL | Freq: Once | INTRAVENOUS | Status: AC
  Administered 2015-02-07: 1000 mL via INTRAVENOUS

## 2015-02-07 MED ORDER — TECHNETIUM TO 99M ALBUMIN AGGREGATED
6.0000 | Freq: Once | INTRAVENOUS | Status: AC | PRN
  Administered 2015-02-07: 6 via INTRAVENOUS

## 2015-02-07 MED ORDER — ASPIRIN 81 MG PO CHEW
324.0000 mg | CHEWABLE_TABLET | Freq: Once | ORAL | Status: AC
  Administered 2015-02-07: 324 mg via ORAL
  Filled 2015-02-07: qty 4

## 2015-02-07 NOTE — ED Notes (Signed)
Nurse starting IV 

## 2015-02-07 NOTE — ED Notes (Addendum)
Pt reports sudden onset of shortness of breath around 1300. Hx of asthma, uses inhaler BID, used inhaler this am. Pt denies pain. 99% on room air. Respirations rapid. No hx cigarette smoking.   Pt was on amoxicillin for cough, finished abx last week. Not pt has intermittent non productive cough.

## 2015-02-07 NOTE — ED Notes (Signed)
Pt transported to [facility] 

## 2015-02-07 NOTE — ED Notes (Signed)
Pt comes in today with a c/o shortness of breath. Pt states that she was here at the hospital with her spouse when she got back to the car she began to feel short of breath.

## 2015-02-07 NOTE — ED Provider Notes (Signed)
Dg Chest 2 View  02/07/2015   CLINICAL DATA:  Shortness of breath. History of non-Hodgkin's lymphoma. History of breast carcinoma.  EXAM: CHEST  2 VIEW  COMPARISON:  Chest radiograph July 02, 2013; chest CT Nov 29, 2014  FINDINGS: There is scarring in the right mid lung and right base regions, present previously. There is no frank edema or consolidation. The heart size and pulmonary vascularity are within normal limits. No adenopathy is apparent currently. There is atherosclerotic change throughout the aorta. There are surgical clips on the right and left sides with evidence of previous mastectomies. There are no appreciable bone lesions.  IMPRESSION: Scarring right lung. No edema or consolidation. Status post mastectomies. No adenopathy. Atherosclerotic change noted throughout the aorta.   Electronically Signed   By: Lowella Grip III M.D.   On: 02/07/2015 14:22   [facility] Pulmonary Perf And Vent  02/07/2015   CLINICAL DATA:  Shortness of Breath  EXAM: NUCLEAR MEDICINE VENTILATION - PERFUSION LUNG SCAN  TECHNIQUE: Ventilation images were obtained in multiple projections using inhaled aerosol Tc-60m DTPA. Perfusion images were obtained in multiple projections after intravenous injection of Tc-51m MAA.  RADIOPHARMACEUTICALS:  41.0 mCi Technetium-27m DTPA aerosol inhalation and 6.0 mCi Technetium-24m MAA IV  COMPARISON:  None.  FINDINGS: Ventilation: Ventilation images demonstrate some clumping particularly in the lower lobes bilaterally slightly greater on the right than the left.  Perfusion: No wedge shaped peripheral perfusion defects to suggest acute pulmonary embolism.  IMPRESSION: No perfusion defect to suggest pulmonary embolism.  Some clumping is noted within the ventilation images which may be related to COPD.   Electronically Signed   By: Inez Catalina M.D.   On: 02/07/2015 18:33   I personally reviewed the imaging tests through PACS system I reviewed available ER/hospitalization records through the  [place], MD 02/07/15 1936

## 2015-02-07 NOTE — ED Provider Notes (Signed)
CSN: [id number]     Arrival date & time 02/07/15  1315 History   First MD Initiated Contact with Patient 02/07/15 1339     Chief Complaint  Patient presents with  . Shortness of Breath     (Consider location/radiation/quality/duration/timing/severity/associated sxs/prior Treatment) HPI  81 year old female presents with acute shortness of breath that started about 2 hours ago. The patient was driving her husband to go get a CT scan for him when while in the car she felt sudden shortness of breath. Last week she was treated for a cough with amoxicillin. She finished this at least 5 years ago. Since then has not been coughing much. No fevers. Never had shortness of breath until today. Nothing makes the shortness of breath better or worse. There is no pain including no chest pain or abdominal pain. Has not had any leg pain or leg swelling. Currently has a history of lymphoma that is not being treated. Prior history of bilateral mastectomy.  Past Medical History  Diagnosis Date  . Asthma   . Hearing loss   . Diabetes mellitus without complication     3/82/50.Marland KitchenMarland Kitchenpt denies  . GERD (gastroesophageal reflux disease)   . NHL (non-Hodgkin's lymphoma)     nhl dx 9/04 breast ca dx1/12   Past Surgical History  Procedure Laterality Date  . Mastectomy  2 /8/ 12    bilateral  . Ba-ha ear implant     Family History  Problem Relation Age of Onset  . Lung cancer Father   . Cancer Father     lung  . Cancer Mother    History  Substance Use Topics  . Smoking status: Never Smoker   . Smokeless tobacco: Never Used     Comment: husband wsa smoker  . Alcohol Use: No     Comment: no   OB History    No data available     Review of Systems  Constitutional: Negative for fever and diaphoresis.  Respiratory: Positive for shortness of breath. Negative for cough.   Cardiovascular: Negative for chest pain and leg swelling.  Gastrointestinal: Negative for nausea, vomiting and abdominal pain.  All  other systems reviewed and are negative.     Allergies  Review of patient's allergies indicates no known allergies.  Home Medications   Prior to Admission medications   Medication Sig Start Date End Date Taking? Authorizing Provider  acetaminophen (TYLENOL) 325 MG tablet Take 650 mg by mouth every 6 (six) hours as needed.   Yes Historical Provider, MD  acyclovir (ZOVIRAX) 400 MG tablet TAKE ONE TABLET BY MOUTH TWICE DAILY 01/21/15  Yes [name], MD  ALPRAZolam Duanne Moron) 0.25 MG tablet Take 0.25 mg by mouth as needed for anxiety. Patient taking approx 2 times weekly   Yes Historical Provider, MD  bisacodyl (DULCOLAX) 5 MG EC tablet Take 5 mg by mouth daily as needed for moderate constipation.   Yes Historical Provider, MD  Calcium Carbonate-Vitamin D (CALCIUM 600 + D PO) Take 2 capsules by mouth daily.    Yes Historical Provider, MD  carbidopa-levodopa (SINEMET IR) 25-100 MG per tablet one tablet at 9 AM/one tablet at 1 PM and 2 tablets at 6 PM Patient taking differently: 1 tablet 4 (four) times daily.  03/19/14  Yes Rebecca S Tat, DO  entacapone (COMTAN) 200 MG tablet TAKE 1 TABLET (200 MG TOTAL) BY MOUTH 3 (THREE) TIMES DAILY. 01/01/15  Yes Rebecca S Tat, DO  Fluticasone-Salmeterol (ADVAIR DISKUS) 100-50 MCG/DOSE AEPB Inhale 1 puff  into the lungs every 12 (twelve) hours.    Yes Historical Provider, MD  Multiple Vitamins-Minerals (MULTIVITAMIN WITH MINERALS) tablet Take 1 tablet by mouth daily.     Yes Historical Provider, MD  ondansetron (ZOFRAN) 8 MG tablet Take by mouth every 8 (eight) hours as needed for nausea or vomiting.   Yes Historical Provider, MD  oxybutynin (DITROPAN-XL) 10 MG 24 hr tablet Take 10 mg by mouth at bedtime.   Yes Historical Provider, MD  zinc gluconate 50 MG tablet Take 50 mg by mouth daily.   Yes Historical Provider, MD  amLODipine (NORVASC) 2.5 MG tablet Take 2.5 mg by mouth daily.    Historical Provider, MD  anastrozole (ARIMIDEX) 1 MG tablet Take 1 tablet (1  mg total) by mouth daily. 06/28/14   [name], MD  ranitidine (ZANTAC) 300 MG tablet Take 300 mg by mouth 2 (two) times daily.    Historical Provider, MD   BP 180/81 mmHg  Pulse 73  Temp(Src) 97.5 F (36.4 C) (Oral)  Resp 26  Wt 111 lb (50.349 kg)  SpO2 100% Physical Exam  Constitutional: She is oriented to person, place, and time. She appears well-developed and well-nourished.  HENT:  Head: Normocephalic and atraumatic.  Right Ear: External ear normal.  Left Ear: External ear normal.  Nose: Nose normal.  Eyes: Right eye exhibits no discharge. Left eye exhibits no discharge.  Cardiovascular: Normal rate and regular rhythm.   Murmur heard. Pulmonary/Chest: Effort normal and breath sounds normal. She has no wheezes. She has no rales.  Abdominal: Soft. There is no tenderness.  Musculoskeletal: She exhibits no edema or tenderness.  Neurological: She is alert and oriented to person, place, and time.  Skin: Skin is warm and dry.  Nursing note and vitals reviewed.   ED Course  Procedures (including critical care time) Labs Review Labs Reviewed  COMPREHENSIVE METABOLIC PANEL - Abnormal; Notable for the following:    Glucose, Bld 105 (*)    Creatinine, Ser 1.24 (*)    ALT 8 (*)    GFR calc non Af Amer 38 (*)    GFR calc Af Amer 45 (*)    All other components within normal limits  CBC WITH DIFFERENTIAL/PLATELET - Abnormal; Notable for the following:    RBC 3.62 (*)    Hemoglobin 10.8 (*)    HCT 31.8 (*)    All other components within normal limits  D-DIMER, QUANTITATIVE (NOT AT [facility]) - Abnormal; Notable for the following:    D-Dimer, Quant 1.27 (*)    All other components within normal limits  BRAIN NATRIURETIC PEPTIDE - Abnormal; Notable for the following:    B Natriuretic Peptide 322.7 (*)    All other components within normal limits  I-STAT CG4 LACTIC ACID, ED  Randolm Idol, ED    Imaging Review Dg Chest 2 View  02/07/2015   CLINICAL DATA:  Shortness of  breath. History of non-Hodgkin's lymphoma. History of breast carcinoma.  EXAM: CHEST  2 VIEW  COMPARISON:  Chest radiograph July 02, 2013; chest CT Nov 29, 2014  FINDINGS: There is scarring in the right mid lung and right base regions, present previously. There is no frank edema or consolidation. The heart size and pulmonary vascularity are within normal limits. No adenopathy is apparent currently. There is atherosclerotic change throughout the aorta. There are surgical clips on the right and left sides with evidence of previous mastectomies. There are no appreciable bone lesions.  IMPRESSION: Scarring right lung. No edema  or consolidation. Status post mastectomies. No adenopathy. Atherosclerotic change noted throughout the aorta.   Electronically Signed   By: Lowella Grip III M.D.   On: 02/07/2015 14:22     EKG Interpretation   Date/Time:  Thursday February 07 2015 13:42:16 EDT Ventricular Rate:  73 PR Interval:  154 QRS Duration: 86 QT Interval:  413 QTC Calculation: 455 R Axis:   69 Text Interpretation:  Sinus rhythm Probable anteroseptal infarct, old  Baseline wander in lead(s) V5 no significant change since 2004 Confirmed  by Jerren Flinchbaugh  MD, Cereniti Curb ([id number]) on 02/07/2015 1:54:30 PM      MDM   Final diagnoses:  SOB (shortness of breath)    Patient shows no signs of increased work of breathing. On my exam her lungs are clear, initial workup is unremarkable except for elevated d-dimer. EKG shows no ischemic changes. When the husband showed up, he notes that the car was very warm when they first got in a thinks this contributed to her shortness of breath in the car. On my re-evaluation patient states there is no shortness of breath at all. Husband and patient ignores that she does sometimes get short of breath when she gets very hot. Patient did not pass out or nearly pass out. At this point given her GFR, she will need to get a VQ scan to help rule out pulmonary embolism. I highly doubt this  is ACS, however given she is elderly with diabetes had a discussion with patient and family and they prefer to get a second troponin follow-up with PCP. Feels quite reasonable, discussed strict return precautions and importance of close follow-up. Care transferred to Dr. Venora Maples with VQ and second troponin pending    Sherwood Gambler, MD 02/07/15 [phone]

## 2015-02-28 DIAGNOSIS — H26492 Other secondary cataract, left eye: Secondary | ICD-10-CM | POA: Diagnosis not present

## 2015-03-04 ENCOUNTER — Other Ambulatory Visit: Payer: Self-pay | Admitting: Neurology

## 2015-03-04 DIAGNOSIS — G20 Parkinson's disease: Secondary | ICD-10-CM

## 2015-03-04 MED ORDER — CARBIDOPA-LEVODOPA 25-100 MG PO TABS: ORAL_TABLET | ORAL | Status: DC

## 2015-03-04 NOTE — Telephone Encounter (Signed)
Carbidopa Levodopa 25/100 refill requested. Per last office note- patient to remain on medication. Refill approved and sent to patient's pharmacy.   

## 2015-03-05 ENCOUNTER — Encounter: Payer: Self-pay | Admitting: Neurology

## 2015-03-05 ENCOUNTER — Ambulatory Visit (INDEPENDENT_AMBULATORY_CARE_PROVIDER_SITE_OTHER): Payer: Medicare Other | Admitting: Neurology

## 2015-03-05 VITALS — BP 130/80 | HR 76 | Ht 63.0 in | Wt 111.0 lb

## 2015-03-05 DIAGNOSIS — G20 Parkinson's disease: Secondary | ICD-10-CM | POA: Diagnosis not present

## 2015-03-05 DIAGNOSIS — F411 Generalized anxiety disorder: Secondary | ICD-10-CM | POA: Diagnosis not present

## 2015-03-05 MED ORDER — CARBIDOPA-LEVODOPA ER 50-200 MG PO TBCR: 1.0000 | EXTENDED_RELEASE_TABLET | Freq: Every day | ORAL | Status: DC

## 2015-03-05 NOTE — Patient Instructions (Signed)
1.  Add carbidopa/levodopa 50/200 CR at bedtime 2.  When you feel anxious, try to take an extra full tablet or 1/2 tablet of carbidopa/levodopa 25/100 and dissolve it in ginger ale and take that and see if it helps the anxiety

## 2015-03-05 NOTE — Progress Notes (Signed)
[name] was seen today in the movement disorders clinic for neurologic consultation at the request of Dr. Betsy Coder.    The consultation is for the evaluation of ataxia and tremor.  The patient has been a previous pt of Dr. Erling Cruz.  Dr. Erling Cruz dx the patient with parkinsonism and ET.  This is an 81 y/o female with a complex medical hx of both non Hodgkins lymphoma require chemotherapy and breast CA, s/p bilateral mastectomy.  She has a long hx of tremor.  I reviewed Dr. Bernardo Heater notes that were made available to me.  It appears that she presented with right hand resting tremor in 2009, without associated rigidity or bradykinesia.  There were no known medications causing the problem and it appears that she was initially dx with ET and later transitioned the dx to parkinsonism.  The patient reports no tremor in the L hand.  Her husband reports a little tremor in the R leg.  The patient reports that tremor is most significant with relaxing and she has no tremor if her hands are preoccupied.    She has never been on PD medication or been to PT until yesterday.  03/16/13 update:  The pt presents today with her husband.  She has not been seen since February, 2014 at which time I dx her with PD.  She was started on levodopa 25/100 three times per day.  She did not follow up sooner as she had bronchitis and pneumonia for the last 2 months and is just now recovering.  She did go to PT and liked it but would like to go back.  She is doing well on the levodopa.  Her husband rarely notice tremor any longer.  06/28/13 update:    The pt today presents for f/u with her husband who supplements the hx.  She is on carbidopa/levodopa 25/100 three times per day.  She is doing well but notices R arm tremor now that had gone away.  Rarely she will note tremor in the L hand which she had not noted previously.  She does have some cramping of the feet.  She is unsure if this is associated with wearing off.  She did have some  of this while in my office, and it was time for her to redose the carbidopa/levodopa.  She is now attending therapy, and is enjoying and finding benefit from it.  08/08/12 update:  The patient presents today with her husband, who supplements the history.  The patient has Parkinson's disease and is currently on carbidopa/levodopa 25/100, one tablet 3 times per day.  Last visit, she was describing some cramping in her feet and I was trying to figure out whether or not this was an "off" phenomenon.  After paying attention, the patient called and said that she thought it was and we added entacapone.  The cramping in the feet is better but she is now having a stabbing and shooting pain down the back of the leg and to the lateral side of the lower leg that stops at the ankle.  It has stopped her from exercising.  She sometimes has back pain.  This is fairly inconsistent.  The leg pain seems to start about 4 days after she finished rehabilitation, which did seem to help.  She is not sure if the pain is related to the rehabilitation, however.  10/03/13 update:  The patient presents today with her husband, who supplements the history.  The patient is currently on carbidopa/levodopa 25/100, one  tablet 3 times per day.  She is also on Comtan 200 mg, one tablet 3 times per day.  From a Parkinson's standpoint, she has been doing well.  No falls.  No hallucinations.  Her husband rarely sees tremor, and that is primarily when she is in bed at night and he will feel it rather than see it.  Last visit, she was complaining of back pain that was likely sciatica.  I ordered an EMG but they ultimately canceled that.  I tried her on Lyrica but that caused her to be excessively sleepy.  She went to the chiropractor and states that she is doing much better from that regard.  Unfortunately, she has had bronchitis and a urinary tract infection.  She has been on several antibiotics, which have caused her to be nauseated.  She has also had  dental work and had several of her teeth pulled.  Because of these things, she has had a decreased appetite and really has not been able to eat.  She had a temporary partial placed, but her appetite still hasn't been what it used to be, primarily because of the nausea from the antibiotic.  She has not been exercising because of being sick.  She is hoping to get back to that soon.  02/02/14 update:  The patient presents today with her husband, who supplements the history.  The patient is currently on carbidopa/levodopa 25/100, 1 tablet at 9 AM/1 PM and 2 tablets at 6 PM.   She is also on Comtan 200 mg, one tablet 3 times per day.   The discomfort in the foot is much better.  From a Parkinson's standpoint, she has been doing well.  No falls.  No hallucinations.  The patient was screened through our Parkinson's screening program on 01/16/2014 and was either at baseline or better than baseline at all therapies and is scheduled for re-screening in January.  She is not currently exercising.    06/05/14 update:  The patient presents today with her husband, who supplements the history.  The patient is currently on carbidopa/levodopa 25/100, 1 tablet at 9 AM/1 PM and 2 tablets at 6 PM.   She is also on Comtan 200 mg, one tablet 3 times per day.  No falls.  No lightheadedness or near syncope.  No hallucinations.   I reviewed records since last visit.  Weight loss has been a concern.  The dietician has talked with her about suggestions to help, and the patient states that she was told to take Carnation 3 times per day.  Pt states that the biggest issue that has caused weight loss has been persistent nausea, which has been going on for about 2 months.  They really do not think that it is related to the Parkinson's medications, because it did not start with changes in Parkinson's medications.  It actually seemed to start after a visit to a restaurant and then persisted.  She just feels nauseated, has lost taste for food and  has lost appetite.  She followed up with Dr. Kenton Kingfisher and was started on Zantac about 3 or 4 days ago and does feel better and her blood pressure medication was discontinued.  10/04/14 update:  The patient presents today with her husband, who supplements the history.  The patient is currently on carbidopa/levodopa 25/100, 1 tablet at 9 AM/1 PM and 2 tablets at 6 PM.   She is also on Comtan 200 mg, one tablet 3 times per day. We d/c her comtan  for a week to see if her GI upset changed at all and it did not so we restarted it since obviously that was not the etiology.  She continues to have GI upset in the AM.  She went to her PCP and she was given Zofran and states that it made it worse but she only took it once.  It is only the AM when she has nausea and she does better the rest of the day.    12/14/14 update:  The patient returns today, accompanied by her husband who supplements the history.  Records were reviewed since last visit.  She has a history of breast cancer and was having weight loss/anorexia and saw her oncologist.  She ended up having a CT chest, abdomen and pelvis and those were unremarkable for any new lesions.  She has lost about 13 pounds since March of last year.  She has been complaining about nausea the last several visits.  In fact, we've previously stopped her entacapone to see if that would help the nausea, but it did not.  She did not think that it was related at all to her levodopa.  She no longer has the nausea but in the AM she doesn't feel good.  She has difficulty describing what this means.  She remains on carbidopa/levodopa 25/100, 1 tablet at 9 AM/one tablet at 1 PM and 2 tablets at 6 PM.  She is still on entacapone, 200 mg 3 times a day.  They brought with them a list of blood pressures and while the SBP is generally high (170's-190's without med and 150's-180's with BP med) it was never over 200.  She states that she was off the BP med a few days and felt better but when she went  back on it she felt worse again.  She was told to go back on it because it her BP's were too high.  She is only on amlodipine 2.5 mg.    03/05/15 update:  The patient is following up today regarding her Parkinson's disease.  She is currently on carbidopa/levodopa 25/100, 1 tablet at 9 AM, 1 tablet at 1 PM and 2 tablets at 6 PM.  She takes entacapone with each of these dosages.  At last visit, she was very focused on her blood pressure and I asked her to follow-up with her primary care physician to see if she actually needed her blood pressure medication.  Today, the patient and her husband state that she was able to d/c her amlodipine and her BP has been well controlled. She states that the nausea is better.  She states that she gets up in the AM and takes her med and just feels jittery and anxious.  Her husband asks about trying to take a levodopa at bedtime to see if that helps.  When she takes her xanax, the anxiety does seem to get better.  They were thinking about asking PCP to see if she can go up on the medication.  Husband admits that balance sometimes off and cognition not great.  PT did not think that she needed walker per their hx.  She has gotten back to her exercise.   By 3-4 pm, pt feels well.     Neuroimaging has  previously been performed.  An MRI brain was last done in 2010 but pt can no longer have MRI's because of an implantable hearing device.    PREVIOUS MEDICATIONS: none to date  ALLERGIES:  No Known Allergies  CURRENT MEDICATIONS:  Current Outpatient Prescriptions on File Prior to Visit  Medication Sig Dispense Refill  . acetaminophen (TYLENOL) 325 MG tablet Take 650 mg by mouth every 6 (six) hours as needed.    [name] acyclovir (ZOVIRAX) 400 MG tablet TAKE ONE TABLET BY MOUTH TWICE DAILY 60 tablet 5  . ALPRAZolam (XANAX) 0.25 MG tablet Take 0.25 mg by mouth as needed for anxiety. Patient taking approx 2 times weekly    . anastrozole (ARIMIDEX) 1 MG tablet Take 1 tablet (1 mg total)  by mouth daily. 90 tablet 3  . bisacodyl (DULCOLAX) 5 MG EC tablet Take 5 mg by mouth daily as needed for moderate constipation.    . Calcium Carbonate-Vitamin D (CALCIUM 600 + D PO) Take 1 capsule by mouth daily.     . carbidopa-levodopa (SINEMET IR) 25-100 MG per tablet Take 1 tablet in the morning, 1 tablet in the afternoon, 2 tablets in the evening 360 tablet 3  . entacapone (COMTAN) 200 MG tablet TAKE 1 TABLET (200 MG TOTAL) BY MOUTH 3 (THREE) TIMES DAILY. 270 tablet 3  . Fluticasone-Salmeterol (ADVAIR DISKUS) 100-50 MCG/DOSE AEPB Inhale 1 puff into the lungs every 12 (twelve) hours.     . Multiple Vitamins-Minerals (MULTIVITAMIN WITH MINERALS) tablet Take 1 tablet by mouth daily.      . ondansetron (ZOFRAN) 8 MG tablet Take by mouth every 8 (eight) hours as needed for nausea or vomiting.    [name] oxybutynin (DITROPAN-XL) 10 MG 24 hr tablet Take 10 mg by mouth at bedtime.    . ranitidine (ZANTAC) 300 MG tablet Take 300 mg by mouth 2 (two) times daily.    [name] zinc gluconate 50 MG tablet Take 50 mg by mouth daily.     No current facility-administered medications on file prior to visit.    PAST MEDICAL HISTORY:   Past Medical History  Diagnosis Date  . Asthma   . Hearing loss   . Diabetes mellitus without complication     02/01/61.Marland KitchenMarland Kitchenpt denies  . GERD (gastroesophageal reflux disease)   . NHL (non-Hodgkin's lymphoma)     n[facility] dx 9/04 breast ca dx1/12    PAST SURGICAL HISTORY:   Past Surgical History  Procedure Laterality Date  . Mastectomy  2 /8/ 12    bilateral  . Ba-ha ear implant      SOCIAL HISTORY:   History   Social History  . Marital Status: Married    Spouse Name: N/A  . Number of Children: N/A  . Years of Education: N/A   Occupational History  . retired     Pharmacist, hospital   Social History Main Topics  . Smoking status: Never Smoker   . Smokeless tobacco: Never Used     Comment: husband wsa smoker  . Alcohol Use: No     Comment: no  . Drug Use: No  . Sexual  Activity: Not on file   Other Topics Concern  . Not on file   Social History Narrative    FAMILY HISTORY:   Family Status  Relation Status Death Age  . Father Deceased     CA, lung  . Mother Deceased     CA; complications of splenectomy  . Brother Deceased     3, renal failure, DM-2; CAD; CA  . Child Alive     5 (4 biological), one with PKU    ROS:  A complete 10 system review of systems was obtained and was unremarkable apart from what is mentioned  above.  PHYSICAL EXAMINATION:    VITALS:   Filed Vitals:   03/05/15 1057  BP: 130/80  Pulse: 76  Height: 5\' 3"  (1.6 m)  Weight: 111 lb (50.349 kg)   Wt Readings from Last 3 Encounters:  03/05/15 111 lb (50.349 kg)  02/07/15 111 lb (50.349 kg)  12/14/14 111 lb (50.349 kg)     GEN:  The patient appears stated age and is in NAD. CV:  RRR with 3/6 SEM Lungs:  CTAB Neck:  No bruits.     Neurological examination:  Orientation:  Montreal Cognitive Assessment  12/14/2014  Visuospatial/ Executive (0/5) 3  Naming (0/3) 3  Attention: Read list of digits (0/2) 2  Attention: Read list of letters (0/1) 1  Attention: Serial 7 subtraction starting at 100 (0/3) 0  Language: Repeat phrase (0/2) 1  Language : Fluency (0/1) 1  Abstraction (0/2) 1  Delayed Recall (0/5) 0  Orientation (0/6) 6  Total 18  Adjusted Score (based on education) 19   Cranial nerves: There is good facial symmetry.  The visual fields are full to confrontational testing. The speech is fluent and clear. Soft palate rises symmetrically and there is no tongue deviation. Hearing is intact to conversational tone. Sensation: Sensation is intact to light outh throughout. Motor: Strength is 5/5 in the bilateral upper and lower extremities.   Shoulder shrug is equal and symmetric.  There is no pronator drift.   Movement examination: Tone: There is no rigidity. Abnormal movements: There is slight tremor of the right arm Coordination:  There is no decremation  noted today Gait and Station: The patient no has trouble arising out of the chair without use of her hands today.  Stride length and arm swing are good. Labs:  Lab Results  Component Value Date   WBC 4.4 02/07/2015   HGB 10.8* 02/07/2015   HCT 31.8* 02/07/2015   MCV 87.8 02/07/2015   PLT 183 02/07/2015     Chemistry      Component Value Date/Time   NA 139 02/07/2015 1428   NA 143 11/22/2014 1044   K 4.2 02/07/2015 1428   K 4.2 11/22/2014 1044   CL 105 02/07/2015 1428   CL 106 12/26/2012 1110   CO2 27 02/07/2015 1428   CO2 25 11/22/2014 1044   BUN 16 02/07/2015 1428   BUN 13.9 11/22/2014 1044   CREATININE 1.24* 02/07/2015 1428   CREATININE 1.2* 11/22/2014 1044      Component Value Date/Time   CALCIUM 9.0 02/07/2015 1428   CALCIUM 9.1 11/22/2014 1044   CALCIUM 11.1* 07/15/2006 1212   ALKPHOS 88 02/07/2015 1428   ALKPHOS 76 11/22/2014 1044   AST 40 02/07/2015 1428   AST 19 11/22/2014 1044   ALT 8* 02/07/2015 1428   ALT 7 11/22/2014 1044   BILITOT 1.1 02/07/2015 1428   BILITOT 0.42 11/22/2014 1044      Lab Results  Component Value Date   VITAMINB12 1053* 06/10/2006   Lab Results  Component Value Date   TSH 2.93 09/19/2012    ASSESSMENT/PLAN:  1.  Idiopathic Parkinson's disease, dx 08/2012.  This is evidenced by bradykinesia, tremor, postural instability and rigidity.  There are no atypical features.  -We discussed the diagnosis as well as pathophysiology of the disease.  We discussed treatment options as well as prognostic indicators.  Patient education was provided.  - she is currently on carbidopa/levodopa 25/100 one tablet at 9 AM/one tablet at 1 PM and 2 tablets at 6 PM  and will continue this.  Risks, benefits, side effects and alternative therapies were discussed.  The opportunity to ask questions was given and they were answered to the best of my ability.  The patient expressed understanding and willingness to follow the outlined treatment protocols.  -I told  her that she could try an extra carbidopa/levodopa when she gets anxious to see if that helps.  This should help [place] decipher anxiety from inner tremor.  -Pt is on entacapone tid and she will continue on this.    -Her husband asked me about potentially adding carbidopa/levodopa 50/200 at night to see if that would make her mornings better and we decided to try that today. 2.  AM nausea with weight loss  -This has resolved and her weight has been stable over several visits. 3.  Anxiety  -Again, there is a discrepancy on whether this is true anxiety or whether this represents wearing off of levodopa.  Hopefully, the changes above will help [place] to decipher this.  Either way, I told her that I really do not want her taking Xanax on a daily basis.  She already has diminished cognition and is at risk for falls.  If she needs daily anxiety medication, then perhaps we should consider something such as BuSpar or even Wellbutrin or Remeron.  We can discuss this further in the future. 4. F/u 3-4 months, sooner should new neuro issues arise.  Much greater than 50% of this visit was spent in counseling with the patient and the family.  Total face to face time:  25 min

## 2015-03-07 ENCOUNTER — Ambulatory Visit (HOSPITAL_BASED_OUTPATIENT_CLINIC_OR_DEPARTMENT_OTHER): Payer: Medicare Other | Admitting: Oncology

## 2015-03-07 ENCOUNTER — Telehealth: Payer: Self-pay | Admitting: Oncology

## 2015-03-07 VITALS — BP 132/65 | HR 70 | Temp 98.6°F | Resp 18 | Ht 63.0 in | Wt 109.4 lb

## 2015-03-07 DIAGNOSIS — Z853 Personal history of malignant neoplasm of breast: Secondary | ICD-10-CM

## 2015-03-07 DIAGNOSIS — R0600 Dyspnea, unspecified: Secondary | ICD-10-CM | POA: Diagnosis not present

## 2015-03-07 DIAGNOSIS — F411 Generalized anxiety disorder: Secondary | ICD-10-CM

## 2015-03-07 DIAGNOSIS — C8590 Non-Hodgkin lymphoma, unspecified, unspecified site: Secondary | ICD-10-CM | POA: Diagnosis not present

## 2015-03-07 NOTE — Progress Notes (Signed)
[facility] OFFICE PROGRESS NOTE   Diagnosis: Non-Hodgkin's lymphoma, breast cancer  INTERVAL HISTORY:   She returns as scheduled. She feels well at present. Good appetite. She walks at an indoor track. She complains of anxiety in the mornings. Her Parkinsons regimen was recently adjusted by Dr. [name] in an attempt to improve the a.m. anxiety. She continues arimidex. No hot flashes or arthralgias.  She was evaluated in the emergency room with acute onset dyspnea. A ventilation-perfusion scan revealed no evidence of pulmonary embolism. A chest x-ray revealed no acute finding.  Objective:  Vital signs in last 24 hours:  Blood pressure 132/65, pulse 70, temperature 98.6 F (37 C), temperature source Oral, resp. rate 18, height 5' 3"  (1.6 m), weight 109 lb 6.4 oz (49.624 kg), SpO2 99 %.    HEENT: Neck without mass Lymphatics: No cervical, supraclavicular, axillary, or inguinal nodes Resp: Lungs clear bilaterally Cardio: Regular rate and rhythm GI: No hepatosplenomegaly, fullness in the left mid abdomen without a discrete mass Vascular: No leg edema Breasts: Status post bilateral mastectomy, no evidence for chest wall tumor recurrence    Lab Results:  Lab Results  Component Value Date   WBC 4.4 02/07/2015   HGB 10.8* 02/07/2015   HCT 31.8* 02/07/2015   MCV 87.8 02/07/2015   PLT 183 02/07/2015   NEUTROABS 3.0 02/07/2015   Creatinine 1.24, calcium 9.0, albumen 3.8  Medications: I have reviewed the patient's current medications.  Assessment/Plan: 1. Non-Hodgkin lymphoma treated with fludarabine/rituximab, last in July of 2009.  1. Restaging CT 11/24/2010 revealed evidence of progressive lymphoma in the abdomen and pelvis.  2. Initiation of salvage therapy with bendamustine/rituximab 12/04/2010. She completed 3 cycles.  3. Restaging CT 03/03/2011 revealed stable retroperitoneal soft tissue and a marked decrease in the soft tissue thickening associated with  dilated loops of small bowel. 2. History of ITP. Intermittent mild thrombocytopenia persists. 3. History of Herpes zoster-maintained on prophylactic acyclovir. She will decrease the acyclovir to once daily 4. Anemia secondary to non-Hodgkin lymphoma, chemotherapy, and a history of autoimmune hemolysis.  5. Hypertension. 6. Right-hand tremor/ataxia-followed by neurology, now taking Sinemet, diagnosed with Parkinson's disease 7. Hearing loss-status post placement of an implanted hearing device by Dr. Cresenciano Lick. 8. Bilateral breast cancer, right breast cancer-grade 1, T1 N0, ER/PR positive, and HER2 negative. Left breast cancer, synchronous grade 2, T2 N0, ER/PR positive, and HER2 negative. She began adjuvant Arimidex on 09/15/2010. 9. Inflammatory changes of both lungs on a CT of the chest 11/24/2010-progressive on the restaging CT 03/03/2011, status post a bronchoscopy 03/17/2011 with no evidence of granulomata or tumor. 10. 11 mm spiculated lesion in the lingula on a CT 11/24/2010-less distinct on the CT 03/03/2011. 11. Upper respiratory infection while visiting her [place] in [place], June 2013, resolved after treatment with Levaquin/steroids 12. Upper respiratory infection,? Pneumonia summer 2014-chest x-ray 02/17/2013 with bilateral infiltrates 13. CT chest abdomen and pelvis on 11/29/2014 with no evidence of active lymphoma or metastatic breast cancer. Previously demonstrated retroperitoneal process appeared improved with probable residual fibrosis. No discrete adenopathy. Interval near complete resolution of patchy airspace opacities in both lungs.     Disposition:  [name] remains in clinical remission from breast cancer and non-Hodgkin's and follow-up. She will continue follow-up with Dr. [name] for management of Parkinson's disease and anxiety. She has persistent mild anemia without a significant change over the past year. She will return for an office and lab visit in 4 months. She  continues adjuvant Arimidex for breast  cancer.  Betsy Coder, MD  03/07/2015  12:50 PM

## 2015-03-07 NOTE — Telephone Encounter (Signed)
Pt confirmed labs/ov per 08/11 POF, gave pt avs and calendar.... KJ °

## 2015-03-22 ENCOUNTER — Ambulatory Visit: Payer: Medicare Other | Admitting: Neurology

## 2015-04-10 DIAGNOSIS — I10 Essential (primary) hypertension: Secondary | ICD-10-CM | POA: Diagnosis not present

## 2015-04-10 DIAGNOSIS — Z23 Encounter for immunization: Secondary | ICD-10-CM | POA: Diagnosis not present

## 2015-04-10 DIAGNOSIS — G20 Parkinson's disease: Secondary | ICD-10-CM | POA: Diagnosis not present

## 2015-04-10 DIAGNOSIS — E785 Hyperlipidemia, unspecified: Secondary | ICD-10-CM | POA: Diagnosis not present

## 2015-04-10 DIAGNOSIS — Z8572 Personal history of non-Hodgkin lymphomas: Secondary | ICD-10-CM | POA: Diagnosis not present

## 2015-04-10 DIAGNOSIS — F419 Anxiety disorder, unspecified: Secondary | ICD-10-CM | POA: Diagnosis not present

## 2015-05-09 ENCOUNTER — Telehealth: Payer: Self-pay | Admitting: Oncology

## 2015-05-09 NOTE — Telephone Encounter (Signed)
Gave and printed appts ched and avs for pt for DEC  °

## 2015-05-23 DIAGNOSIS — N3946 Mixed incontinence: Secondary | ICD-10-CM | POA: Diagnosis not present

## 2015-05-23 DIAGNOSIS — R350 Frequency of micturition: Secondary | ICD-10-CM | POA: Diagnosis not present

## 2015-06-05 ENCOUNTER — Ambulatory Visit: Payer: Medicare Other | Admitting: Neurology

## 2015-06-05 DIAGNOSIS — H908 Mixed conductive and sensorineural hearing loss, unspecified: Secondary | ICD-10-CM | POA: Diagnosis not present

## 2015-06-05 DIAGNOSIS — H95191 Other disorders following mastoidectomy, right ear: Secondary | ICD-10-CM | POA: Diagnosis not present

## 2015-06-11 ENCOUNTER — Encounter: Payer: Self-pay | Admitting: Neurology

## 2015-06-11 ENCOUNTER — Ambulatory Visit: Payer: Medicare Other | Admitting: Oncology

## 2015-06-11 ENCOUNTER — Ambulatory Visit (INDEPENDENT_AMBULATORY_CARE_PROVIDER_SITE_OTHER): Payer: Medicare Other | Admitting: Neurology

## 2015-06-11 ENCOUNTER — Other Ambulatory Visit: Payer: Medicare Other

## 2015-06-11 VITALS — BP 126/66 | HR 78 | Ht 61.0 in | Wt 117.0 lb

## 2015-06-11 DIAGNOSIS — F411 Generalized anxiety disorder: Secondary | ICD-10-CM | POA: Diagnosis not present

## 2015-06-11 DIAGNOSIS — G20 Parkinson's disease: Secondary | ICD-10-CM

## 2015-06-11 DIAGNOSIS — D2312 Other benign neoplasm of skin of left eyelid, including canthus: Secondary | ICD-10-CM | POA: Diagnosis not present

## 2015-06-11 DIAGNOSIS — R011 Cardiac murmur, unspecified: Secondary | ICD-10-CM

## 2015-06-11 DIAGNOSIS — R4189 Other symptoms and signs involving cognitive functions and awareness: Secondary | ICD-10-CM | POA: Diagnosis not present

## 2015-06-11 DIAGNOSIS — H532 Diplopia: Secondary | ICD-10-CM | POA: Diagnosis not present

## 2015-06-11 MED ORDER — BUPROPION HCL ER (XL) 150 MG PO TB24: 150.0000 mg | ORAL_TABLET | Freq: Every day | ORAL | Status: DC

## 2015-06-11 MED ORDER — BUPROPION HCL ER (XL) 300 MG PO TB24: 300.0000 mg | ORAL_TABLET | Freq: Every day | ORAL | Status: DC

## 2015-06-11 NOTE — Progress Notes (Signed)
[name] was seen today in the movement disorders clinic for neurologic consultation at the request of Dr. Betsy Coder.    The consultation is for the evaluation of ataxia and tremor.  The patient has been a previous pt of Dr. Erling Cruz.  Dr. Erling Cruz dx the patient with parkinsonism and ET.  This is an 81 y/o female with a complex medical hx of both non Hodgkins lymphoma require chemotherapy and breast CA, s/p bilateral mastectomy.  She has a long hx of tremor.  I reviewed Dr. Bernardo Heater notes that were made available to me.  It appears that she presented with right hand resting tremor in 2009, without associated rigidity or bradykinesia.  There were no known medications causing the problem and it appears that she was initially dx with ET and later transitioned the dx to parkinsonism.  The patient reports no tremor in the L hand.  Her husband reports a little tremor in the R leg.  The patient reports that tremor is most significant with relaxing and she has no tremor if her hands are preoccupied.    She has never been on PD medication or been to PT until yesterday.  03/16/13 update:  The pt presents today with her husband.  She has not been seen since February, 2014 at which time I dx her with PD.  She was started on levodopa 25/100 three times per day.  She did not follow up sooner as she had bronchitis and pneumonia for the last 2 months and is just now recovering.  She did go to PT and liked it but would like to go back.  She is doing well on the levodopa.  Her husband rarely notice tremor any longer.  06/28/13 update:    The pt today presents for f/u with her husband who supplements the hx.  She is on carbidopa/levodopa 25/100 three times per day.  She is doing well but notices R arm tremor now that had gone away.  Rarely she will note tremor in the L hand which she had not noted previously.  She does have some cramping of the feet.  She is unsure if this is associated with wearing off.  She did have some  of this while in my office, and it was time for her to redose the carbidopa/levodopa.  She is now attending therapy, and is enjoying and finding benefit from it.  08/08/12 update:  The patient presents today with her husband, who supplements the history.  The patient has Parkinson's disease and is currently on carbidopa/levodopa 25/100, one tablet 3 times per day.  Last visit, she was describing some cramping in her feet and I was trying to figure out whether or not this was an "off" phenomenon.  After paying attention, the patient called and said that she thought it was and we added entacapone.  The cramping in the feet is better but she is now having a stabbing and shooting pain down the back of the leg and to the lateral side of the lower leg that stops at the ankle.  It has stopped her from exercising.  She sometimes has back pain.  This is fairly inconsistent.  The leg pain seems to start about 4 days after she finished rehabilitation, which did seem to help.  She is not sure if the pain is related to the rehabilitation, however.  10/03/13 update:  The patient presents today with her husband, who supplements the history.  The patient is currently on carbidopa/levodopa 25/100, one  tablet 3 times per day.  She is also on Comtan 200 mg, one tablet 3 times per day.  From a Parkinson's standpoint, she has been doing well.  No falls.  No hallucinations.  Her husband rarely sees tremor, and that is primarily when she is in bed at night and he will feel it rather than see it.  Last visit, she was complaining of back pain that was likely sciatica.  I ordered an EMG but they ultimately canceled that.  I tried her on Lyrica but that caused her to be excessively sleepy.  She went to the chiropractor and states that she is doing much better from that regard.  Unfortunately, she has had bronchitis and a urinary tract infection.  She has been on several antibiotics, which have caused her to be nauseated.  She has also had  dental work and had several of her teeth pulled.  Because of these things, she has had a decreased appetite and really has not been able to eat.  She had a temporary partial placed, but her appetite still hasn't been what it used to be, primarily because of the nausea from the antibiotic.  She has not been exercising because of being sick.  She is hoping to get back to that soon.  02/02/14 update:  The patient presents today with her husband, who supplements the history.  The patient is currently on carbidopa/levodopa 25/100, 1 tablet at 9 AM/1 PM and 2 tablets at 6 PM.   She is also on Comtan 200 mg, one tablet 3 times per day.   The discomfort in the foot is much better.  From a Parkinson's standpoint, she has been doing well.  No falls.  No hallucinations.  The patient was screened through our Parkinson's screening program on 01/16/2014 and was either at baseline or better than baseline at all therapies and is scheduled for re-screening in January.  She is not currently exercising.    06/05/14 update:  The patient presents today with her husband, who supplements the history.  The patient is currently on carbidopa/levodopa 25/100, 1 tablet at 9 AM/1 PM and 2 tablets at 6 PM.   She is also on Comtan 200 mg, one tablet 3 times per day.  No falls.  No lightheadedness or near syncope.  No hallucinations.   I reviewed records since last visit.  Weight loss has been a concern.  The dietician has talked with her about suggestions to help, and the patient states that she was told to take Carnation 3 times per day.  Pt states that the biggest issue that has caused weight loss has been persistent nausea, which has been going on for about 2 months.  They really do not think that it is related to the Parkinson's medications, because it did not start with changes in Parkinson's medications.  It actually seemed to start after a visit to a restaurant and then persisted.  She just feels nauseated, has lost taste for food and  has lost appetite.  She followed up with Dr. Kenton Kingfisher and was started on Zantac about 3 or 4 days ago and does feel better and her blood pressure medication was discontinued.  10/04/14 update:  The patient presents today with her husband, who supplements the history.  The patient is currently on carbidopa/levodopa 25/100, 1 tablet at 9 AM/1 PM and 2 tablets at 6 PM.   She is also on Comtan 200 mg, one tablet 3 times per day. We d/c her comtan  for a week to see if her GI upset changed at all and it did not so we restarted it since obviously that was not the etiology.  She continues to have GI upset in the AM.  She went to her PCP and she was given Zofran and states that it made it worse but she only took it once.  It is only the AM when she has nausea and she does better the rest of the day.    12/14/14 update:  The patient returns today, accompanied by her husband who supplements the history.  Records were reviewed since last visit.  She has a history of breast cancer and was having weight loss/anorexia and saw her oncologist.  She ended up having a CT chest, abdomen and pelvis and those were unremarkable for any new lesions.  She has lost about 13 pounds since March of last year.  She has been complaining about nausea the last several visits.  In fact, we've previously stopped her entacapone to see if that would help the nausea, but it did not.  She did not think that it was related at all to her levodopa.  She no longer has the nausea but in the AM she doesn't feel good.  She has difficulty describing what this means.  She remains on carbidopa/levodopa 25/100, 1 tablet at 9 AM/one tablet at 1 PM and 2 tablets at 6 PM.  She is still on entacapone, 200 mg 3 times a day.  They brought with them a list of blood pressures and while the SBP is generally high (170's-190's without med and 150's-180's with BP med) it was never over 200.  She states that she was off the BP med a few days and felt better but when she went  back on it she felt worse again.  She was told to go back on it because it her BP's were too high.  She is only on amlodipine 2.5 mg.    03/05/15 update:  The patient is following up today regarding her Parkinson's disease.  She is currently on carbidopa/levodopa 25/100, 1 tablet at 9 AM, 1 tablet at 1 PM and 2 tablets at 6 PM.  She takes entacapone with each of these dosages.  At last visit, she was very focused on her blood pressure and I asked her to follow-up with her primary care physician to see if she actually needed her blood pressure medication.  Today, the patient and her husband state that she was able to d/c her amlodipine and her BP has been well controlled. She states that the nausea is better.  She states that she gets up in the AM and takes her med and just feels jittery and anxious.  Her husband asks about trying to take a levodopa at bedtime to see if that helps.  When she takes her xanax, the anxiety does seem to get better.  They were thinking about asking PCP to see if she can go up on the medication.  Husband admits that balance sometimes off and cognition not great.  PT did not think that she needed walker per their hx.  She has gotten back to her exercise.   By 3-4 pm, pt feels well.    06/11/15 update:  The patient presents today for follow-up, accompanied by her husband who supplements the history.  She is on carbidopa/levodopa 25/100, 1 tablet at 9 AM, 1 tablet at 1 PM and 2 tablets at 6 PM.  She is on entacapone  200 mg 3 times a day and last visit we added carbidopa/levodopa 50/200 at night.  She didn't notice a big difference with that. Her husband isn't sure that she really gave it enough time to know.   Her biggest issue last visit was her anxiety and I told her to try an extra levodopa when she is anxious to see if that helps.  They tried that and it didn't help.    I did tell her that I did not want her taking daily Xanax.  Today, the patient states that the "only thing that helps  is xanax."   Unfortunately, she has not been exercising.  Her husband is undergoing tx daily for prostate CA and they are consumed with that.  Balance has been okay and her husband states that she is doing well in that regard.  No falls.  She is sleeping from 9pm until 5-6am. She is taking naps during the day.     Neuroimaging has  previously been performed.  An MRI brain was last done in 2010 but pt can no longer have MRI's because of an implantable hearing device.    PREVIOUS MEDICATIONS: none to date  ALLERGIES:  No Known Allergies  CURRENT MEDICATIONS:  Current Outpatient Prescriptions on File Prior to Visit  Medication Sig Dispense Refill  . acetaminophen (TYLENOL) 325 MG tablet Take 650 mg by mouth every 6 (six) hours as needed.    [name] acyclovir (ZOVIRAX) 400 MG tablet TAKE ONE TABLET BY MOUTH TWICE DAILY 60 tablet 5  . ALPRAZolam (XANAX) 0.25 MG tablet Take 0.25 mg by mouth as needed for anxiety. Patient taking approx 2 times weekly    . anastrozole (ARIMIDEX) 1 MG tablet Take 1 tablet (1 mg total) by mouth daily. 90 tablet 3  . bisacodyl (DULCOLAX) 5 MG EC tablet Take 5 mg by mouth daily as needed for moderate constipation.    . Calcium Carbonate-Vitamin D (CALCIUM 600 + D PO) Take 1 capsule by mouth daily.     . carbidopa-levodopa (SINEMET CR) 50-200 MG per tablet Take 1 tablet by mouth at bedtime. 90 tablet 3  . carbidopa-levodopa (SINEMET IR) 25-100 MG per tablet Take 1 tablet in the morning, 1 tablet in the afternoon, 2 tablets in the evening 360 tablet 3  . entacapone (COMTAN) 200 MG tablet TAKE 1 TABLET (200 MG TOTAL) BY MOUTH 3 (THREE) TIMES DAILY. 270 tablet 3  . Fluticasone-Salmeterol (ADVAIR DISKUS) 100-50 MCG/DOSE AEPB Inhale 1 puff into the lungs every 12 (twelve) hours.     . Multiple Vitamins-Minerals (MULTIVITAMIN WITH MINERALS) tablet Take 1 tablet by mouth daily.      . ondansetron (ZOFRAN) 8 MG tablet Take by mouth every 8 (eight) hours as needed for nausea or  vomiting.    [name] oxybutynin (DITROPAN-XL) 10 MG 24 hr tablet Take 10 mg by mouth at bedtime.    . ranitidine (ZANTAC) 300 MG tablet Take 300 mg by mouth 2 (two) times daily.    [name] zinc gluconate 50 MG tablet Take 50 mg by mouth daily.     No current facility-administered medications on file prior to visit.    PAST MEDICAL HISTORY:   Past Medical History  Diagnosis Date  . Asthma   . Hearing loss   . Diabetes mellitus without complication     [id number].Marland KitchenMarland Kitchenpt denies  . GERD (gastroesophageal reflux disease)   . NHL (non-Hodgkin's lymphoma)     nhl dx 9/04 breast ca dx1/12    PAST SURGICAL  HISTORY:   Past Surgical History  Procedure Laterality Date  . Mastectomy  2 /8/ 12    bilateral  . Ba-ha ear implant      SOCIAL HISTORY:   Social History   Social History  . Marital Status: Married    Spouse Name: N/A  . Number of Children: N/A  . Years of Education: N/A   Occupational History  . retired     Pharmacist, hospital   Social History Main Topics  . Smoking status: Never Smoker   . Smokeless tobacco: Never Used     Comment: husband wsa smoker  . Alcohol Use: No     Comment: no  . Drug Use: No  . Sexual Activity: Not on file   Other Topics Concern  . Not on file   Social History Narrative    FAMILY HISTORY:   Family Status  Relation Status Death Age  . Father Deceased     CA, lung  . Mother Deceased     CA; complications of splenectomy  . Brother Deceased     3, renal failure, DM-2; CAD; CA  . Child Alive     5 (4 biological), one with PKU    ROS:  A complete 10 system review of systems was obtained and was unremarkable apart from what is mentioned above.  PHYSICAL EXAMINATION:    VITALS:   Filed Vitals:   06/11/15 1429  BP: 126/66  Pulse: 78  Height: 5\' 1"  (1.549 m)  Weight: 117 lb (53.071 kg)   Wt Readings from Last 3 Encounters:  06/11/15 117 lb (53.071 kg)  03/07/15 109 lb 6.4 oz (49.624 kg)  03/05/15 111 lb (50.349 kg)     GEN:  The patient  appears stated age and is in NAD. CV:  RRR with 3/6 SEM Lungs:  CTAB Neck:  No bruits.     Neurological examination:  Orientation: She is alert and oriented to person and place.  She is repetitive and asks the same question over and over. Montreal Cognitive Assessment  12/14/2014  Visuospatial/ Executive (0/5) 3  Naming (0/3) 3  Attention: Read list of digits (0/2) 2  Attention: Read list of letters (0/1) 1  Attention: Serial 7 subtraction starting at 100 (0/3) 0  Language: Repeat phrase (0/2) 1  Language : Fluency (0/1) 1  Abstraction (0/2) 1  Delayed Recall (0/5) 0  Orientation (0/6) 6  Total 18  Adjusted Score (based on education) 19   Cranial nerves: There is good facial symmetry.  The visual fields are full to confrontational testing. The speech is fluent and clear.  She is hypophonic.  Soft palate rises symmetrically and there is no tongue deviation. Hearing is intact to conversational tone. Sensation: Sensation is intact to light outh throughout. Motor: Strength is 5/5 in the bilateral upper and lower extremities.   Shoulder shrug is equal and symmetric.  There is no pronator drift.   Movement examination: Tone: There is no rigidity. Abnormal movements: There is no tremor today. Coordination:  There is no decremation noted today Gait and Station: The patient no has trouble arising out of the chair without use of her hands today.  Stride length and arm swing are good. Labs:  Lab Results  Component Value Date   WBC 4.4 02/07/2015   HGB 10.8* 02/07/2015   HCT 31.8* 02/07/2015   MCV 87.8 02/07/2015   PLT 183 02/07/2015     Chemistry      Component Value Date/Time  NA 139 02/07/2015 1428   NA 143 11/22/2014 1044   K 4.2 02/07/2015 1428   K 4.2 11/22/2014 1044   CL 105 02/07/2015 1428   CL 106 12/26/2012 1110   CO2 27 02/07/2015 1428   CO2 25 11/22/2014 1044   BUN 16 02/07/2015 1428   BUN 13.9 11/22/2014 1044   CREATININE 1.24* 02/07/2015 1428   CREATININE  1.2* 11/22/2014 1044      Component Value Date/Time   CALCIUM 9.0 02/07/2015 1428   CALCIUM 9.1 11/22/2014 1044   CALCIUM 11.1* 07/15/2006 1212   ALKPHOS 88 02/07/2015 1428   ALKPHOS 76 11/22/2014 1044   AST 40 02/07/2015 1428   AST 19 11/22/2014 1044   ALT 8* 02/07/2015 1428   ALT 7 11/22/2014 1044   BILITOT 1.1 02/07/2015 1428   BILITOT 0.42 11/22/2014 1044      Lab Results  Component Value Date   VITAMINB12 1053* 06/10/2006   Lab Results  Component Value Date   TSH 2.93 09/19/2012    ASSESSMENT/PLAN:  1.  Idiopathic Parkinson's disease, dx 08/2012.  This is evidenced by bradykinesia, tremor, postural instability and rigidity.  There are no atypical features.  -We discussed the diagnosis as well as pathophysiology of the disease.  We discussed treatment options as well as prognostic indicators.  Patient education was provided.  - she is currently on carbidopa/levodopa 25/100 one tablet at 9 AM/one tablet at 1 PM and 2 tablets at 6 PM and will continue this.  Risks, benefits, side effects and alternative therapies were discussed.  The opportunity to ask questions was given and they were answered to the best of my ability.  The patient expressed understanding and willingness to follow the outlined treatment protocols.  -Pt is on entacapone tid and she will continue on this.    -After quite some discussion, we decided to have her retry the carbidopa/levodopa 50/200 at night.  She brought her bottle and over half of that was filled still, so she admits that she did not try it for very long. 2.  AM nausea with weight loss  -This has resolved and her weight has been stable over several visits and actually increased this visit. 3.  Anxiety  -I again asked her not to take daily Xanax.  She has difficulty shown increased cognitive impairment and likely dementia now.  In addition, the daily Xanax makes her a fall risk.  Instead, we are going to try Wellbutrin XL.  Understands the risk of  lowering seizure threshold.  Told her that if she needs to Xanax I really do not want her taking it more than 1 or 2 days a week. 4.  Cardiac murmur  -Has not seen cardiology in many years.  They asked for a referral today. 5. Follow up is anticipated in the next few months, sooner should new neurologic issues arise.

## 2015-06-11 NOTE — Patient Instructions (Addendum)
Appt with Dr Marlou Porch scheduled 08/05/2015 at 3:00 pm.  Start Wellbutrin 150 mg - 1 tablet in the mornings for two weeks, then switch to 300 mg - 1 tablet in the morning.

## 2015-06-13 ENCOUNTER — Telehealth: Payer: Self-pay | Admitting: Oncology

## 2015-06-13 ENCOUNTER — Ambulatory Visit (HOSPITAL_BASED_OUTPATIENT_CLINIC_OR_DEPARTMENT_OTHER): Payer: Medicare Other | Admitting: Oncology

## 2015-06-13 ENCOUNTER — Other Ambulatory Visit (HOSPITAL_BASED_OUTPATIENT_CLINIC_OR_DEPARTMENT_OTHER): Payer: Medicare Other

## 2015-06-13 VITALS — BP 147/63 | HR 81 | Temp 97.7°F | Resp 16 | Ht 61.0 in | Wt 113.5 lb

## 2015-06-13 DIAGNOSIS — L820 Inflamed seborrheic keratosis: Secondary | ICD-10-CM | POA: Diagnosis not present

## 2015-06-13 DIAGNOSIS — C8590 Non-Hodgkin lymphoma, unspecified, unspecified site: Secondary | ICD-10-CM | POA: Diagnosis not present

## 2015-06-13 DIAGNOSIS — L720 Epidermal cyst: Secondary | ICD-10-CM | POA: Diagnosis not present

## 2015-06-13 DIAGNOSIS — L853 Xerosis cutis: Secondary | ICD-10-CM | POA: Diagnosis not present

## 2015-06-13 LAB — CBC WITH DIFFERENTIAL/PLATELET
BASO%: 1 % (ref 0.0–2.0)
BASOS ABS: 0 10*3/uL (ref 0.0–0.1)
EOS%: 1.3 % (ref 0.0–7.0)
Eosinophils Absolute: 0.1 10*3/uL (ref 0.0–0.5)
HCT: 35.9 % (ref 34.8–46.6)
HGB: 12.2 g/dL (ref 11.6–15.9)
LYMPH%: 22.9 % (ref 14.0–49.7)
MCH: 30.5 pg (ref 25.1–34.0)
MCHC: 34 g/dL (ref 31.5–36.0)
MCV: 89.8 fL (ref 79.5–101.0)
MONO#: 0.3 10*3/uL (ref 0.1–0.9)
MONO%: 6.3 % (ref 0.0–14.0)
NEUT#: 2.7 10*3/uL (ref 1.5–6.5)
NEUT%: 68.5 % (ref 38.4–76.8)
Platelets: 148 10*3/uL (ref 145–400)
RBC: 4 10*6/uL (ref 3.70–5.45)
RDW: 12.9 % (ref 11.2–14.5)
WBC: 4 10*3/uL (ref 3.9–10.3)
lymph#: 0.9 10*3/uL (ref 0.9–3.3)
nRBC: 0 % (ref 0–0)

## 2015-06-13 LAB — COMPREHENSIVE METABOLIC PANEL (CC13)
ALK PHOS: 90 U/L (ref 40–150)
ALT: <9 U/L (ref 0–55)
AST: 23 U/L (ref 5–34)
Albumin: 3.8 g/dL (ref 3.5–5.0)
Anion Gap: 8 mEq/L (ref 3–11)
BILIRUBIN TOTAL: 0.47 mg/dL (ref 0.20–1.20)
BUN: 16.4 mg/dL (ref 7.0–26.0)
CO2: 29 mEq/L (ref 22–29)
CREATININE: 1.2 mg/dL — AB (ref 0.6–1.1)
Calcium: 9.6 mg/dL (ref 8.4–10.4)
Chloride: 105 mEq/L (ref 98–109)
EGFR: 42 mL/min/{1.73_m2} — AB (ref >90)
GLUCOSE: 127 mg/dL (ref 70–140)
POTASSIUM: 4.2 meq/L (ref 3.5–5.1)
SODIUM: 142 meq/L (ref 136–145)
Total Protein: 6.1 g/dL — ABNORMAL LOW (ref 6.4–8.3)

## 2015-06-13 NOTE — Telephone Encounter (Signed)
per pof to sch pt appt-gave pt copy of avs °

## 2015-06-13 NOTE — Progress Notes (Signed)
  [facility] OFFICE PROGRESS NOTE   Diagnosis: Non-Hodgkin's lymphoma, breast cancer  INTERVAL HISTORY:   She returns as scheduled. She continues follow-up with neurology for management of Parkinson's disease. She reports feeling anxious in the mornings. No dyspnea. No other complaint.  Objective:  Vital signs in last 24 hours:  Blood pressure 147/63, pulse 81, temperature 97.7 F (36.5 C), temperature source Oral, resp. rate 16, height $RemoveBe'5\' 1"'[id number]$  (1.549 m), weight 113 lb 8 oz (51.483 kg), SpO2 98 %.    HEENT: Neck without mass Lymphatics: No cervical, supraclavicular, axillary, or inguinal nodes Resp: Lungs clear bilaterally Cardio: Regular rate and rhythm, 2/6 systolic murmur GI: No hepatosplenomegaly, no mass, nontender Vascular: No leg edema Breast: Status post bilateral mastectomy. No evidence for chest wall tumor recurrence.    Lab Results:  Lab Results  Component Value Date   WBC 4.0 06/13/2015   HGB 12.2 06/13/2015   HCT 35.9 06/13/2015   MCV 89.8 06/13/2015   PLT 148 06/13/2015   NEUTROABS 2.7 06/13/2015   Medications: I have reviewed the patient's current medications.  Assessment/Plan: 1. Non-Hodgkin lymphoma treated with fludarabine/rituximab, last in July of 2009.  1. Restaging CT 11/24/2010 revealed evidence of progressive lymphoma in the abdomen and pelvis.  2. Initiation of salvage therapy with bendamustine/rituximab 12/04/2010. She completed 3 cycles.  3. Restaging CT 03/03/2011 revealed stable retroperitoneal soft tissue and a marked decrease in the soft tissue thickening associated with dilated loops of small bowel. 2. History of ITP. Intermittent mild thrombocytopenia persists. 3. History of Herpes zoster-maintained on prophylactic acyclovir. She will decrease the acyclovir to once daily 4. Anemia secondary to non-Hodgkin lymphoma, chemotherapy, and a history of autoimmune hemolysis.  5. Hypertension. 6. Right-hand  tremor/ataxia-followed by neurology, now taking Sinemet, diagnosed with Parkinson's disease 7. Hearing loss-status post placement of an implanted hearing device by Dr. Cresenciano Lick. 8. Bilateral breast cancer, right breast cancer-grade 1, T1 N0, ER/PR positive, and HER2 negative. Left breast cancer, synchronous grade 2, T2 N0, ER/PR positive, and HER2 negative. She began adjuvant Arimidex on 09/15/2010. 9. Inflammatory changes of both lungs on a CT of the chest 11/24/2010-progressive on the restaging CT 03/03/2011, status post a bronchoscopy 03/17/2011 with no evidence of granulomata or tumor. 10. 11 mm spiculated lesion in the lingula on a CT 11/24/2010-less distinct on the CT 03/03/2011. 11. Upper respiratory infection while visiting her son in [place], June 2013, resolved after treatment with Levaquin/steroids 12. Upper respiratory infection,? Pneumonia summer 2014-chest x-ray 02/17/2013 with bilateral infiltrates 13. CT chest abdomen and pelvis on 11/29/2014 with no evidence of active lymphoma or metastatic breast cancer. Previously demonstrated retroperitoneal process appeared improved with probable residual fibrosis. No discrete adenopathy. Interval near complete resolution of patchy airspace opacities in both lungs.     Disposition:  She remains in clinical remission from non-Hodgkin's lymphoma and breast cancer. She will continue Arimidex until the next office visit here. Ms. Kehres will return for an office visit in 3 months.  Betsy Coder, MD  06/13/2015  11:02 AM

## 2015-07-08 ENCOUNTER — Other Ambulatory Visit: Payer: Self-pay | Admitting: Oncology

## 2015-07-13 ENCOUNTER — Other Ambulatory Visit: Payer: Self-pay | Admitting: Oncology

## 2015-07-20 ENCOUNTER — Other Ambulatory Visit: Payer: Self-pay | Admitting: Oncology

## 2015-07-30 ENCOUNTER — Telehealth: Payer: Self-pay | Admitting: *Deleted

## 2015-07-30 NOTE — Telephone Encounter (Signed)
Spoke with patient's husband - he states patient's memory has gotten worse. She is now having hallucinations. Her balance also seems to be getting worse - she has had no falls but is having increased trouble getting around at night. He wants to come in to discuss new symptoms with Dr Tat. Appt made.

## 2015-07-30 NOTE — Telephone Encounter (Signed)
Patient's husband called concerned about patient's memory and behavior.  He said that they had a lot of company over at [place] (their sons and grandkids) and patient still thinks that some of them are still there.  Please call Mr. Criado.  [phone]

## 2015-08-01 DIAGNOSIS — R5383 Other fatigue: Secondary | ICD-10-CM | POA: Diagnosis not present

## 2015-08-01 DIAGNOSIS — G20 Parkinson's disease: Secondary | ICD-10-CM | POA: Diagnosis not present

## 2015-08-01 DIAGNOSIS — E785 Hyperlipidemia, unspecified: Secondary | ICD-10-CM | POA: Diagnosis not present

## 2015-08-01 DIAGNOSIS — F419 Anxiety disorder, unspecified: Secondary | ICD-10-CM | POA: Diagnosis not present

## 2015-08-05 ENCOUNTER — Ambulatory Visit: Payer: Medicare Other | Admitting: Cardiology

## 2015-08-07 ENCOUNTER — Telehealth: Payer: Self-pay | Admitting: Neurology

## 2015-08-07 ENCOUNTER — Ambulatory Visit: Payer: Medicare Other | Admitting: Neurology

## 2015-08-07 ENCOUNTER — Inpatient Hospital Stay (HOSPITAL_COMMUNITY)
Admission: EM | Admit: 2015-08-07 | Discharge: 2015-08-11 | DRG: 689 | Disposition: A | Discharge status: Home Health | Payer: Medicare Other | Attending: Internal Medicine | Admitting: Internal Medicine

## 2015-08-07 ENCOUNTER — Encounter (HOSPITAL_COMMUNITY): Payer: Self-pay

## 2015-08-07 DIAGNOSIS — G20 Parkinson's disease: Secondary | ICD-10-CM | POA: Diagnosis not present

## 2015-08-07 DIAGNOSIS — C50919 Malignant neoplasm of unspecified site of unspecified female breast: Secondary | ICD-10-CM | POA: Diagnosis present

## 2015-08-07 DIAGNOSIS — C8590 Non-Hodgkin lymphoma, unspecified, unspecified site: Secondary | ICD-10-CM | POA: Diagnosis present

## 2015-08-07 DIAGNOSIS — K219 Gastro-esophageal reflux disease without esophagitis: Secondary | ICD-10-CM | POA: Diagnosis present

## 2015-08-07 DIAGNOSIS — N390 Urinary tract infection, site not specified: Principal | ICD-10-CM | POA: Diagnosis present

## 2015-08-07 DIAGNOSIS — Z9013 Acquired absence of bilateral breasts and nipples: Secondary | ICD-10-CM

## 2015-08-07 DIAGNOSIS — R410 Disorientation, unspecified: Secondary | ICD-10-CM

## 2015-08-07 DIAGNOSIS — N179 Acute kidney failure, unspecified: Secondary | ICD-10-CM | POA: Diagnosis present

## 2015-08-07 DIAGNOSIS — D696 Thrombocytopenia, unspecified: Secondary | ICD-10-CM | POA: Diagnosis not present

## 2015-08-07 DIAGNOSIS — E119 Type 2 diabetes mellitus without complications: Secondary | ICD-10-CM | POA: Diagnosis present

## 2015-08-07 DIAGNOSIS — F419 Anxiety disorder, unspecified: Secondary | ICD-10-CM | POA: Diagnosis present

## 2015-08-07 DIAGNOSIS — Z79899 Other long term (current) drug therapy: Secondary | ICD-10-CM

## 2015-08-07 DIAGNOSIS — F329 Major depressive disorder, single episode, unspecified: Secondary | ICD-10-CM | POA: Diagnosis present

## 2015-08-07 DIAGNOSIS — Z801 Family history of malignant neoplasm of trachea, bronchus and lung: Secondary | ICD-10-CM

## 2015-08-07 DIAGNOSIS — J45909 Unspecified asthma, uncomplicated: Secondary | ICD-10-CM | POA: Diagnosis present

## 2015-08-07 DIAGNOSIS — Z809 Family history of malignant neoplasm, unspecified: Secondary | ICD-10-CM

## 2015-08-07 DIAGNOSIS — H9190 Unspecified hearing loss, unspecified ear: Secondary | ICD-10-CM | POA: Diagnosis present

## 2015-08-07 DIAGNOSIS — G9340 Encephalopathy, unspecified: Secondary | ICD-10-CM | POA: Diagnosis present

## 2015-08-07 HISTORY — DX: Major depressive disorder, single episode, unspecified: F32.9

## 2015-08-07 HISTORY — DX: Depression, unspecified: F32.A

## 2015-08-07 HISTORY — DX: Parkinson's disease: G20

## 2015-08-07 HISTORY — DX: Parkinson's disease without dyskinesia, without mention of fluctuations: G20.A1

## 2015-08-07 HISTORY — DX: Anxiety disorder, unspecified: F41.9

## 2015-08-07 LAB — BASIC METABOLIC PANEL
Anion gap: 11 (ref 5–15)
BUN: 15 mg/dL (ref 6–20)
CO2: 27 mmol/L (ref 22–32)
Calcium: 9.4 mg/dL (ref 8.9–10.3)
Chloride: 103 mmol/L (ref 101–111)
Creatinine, Ser: 1.1 mg/dL — ABNORMAL HIGH (ref 0.44–1.00)
GFR calc Af Amer: 51 mL/min — ABNORMAL LOW (ref >60)
GFR calc non Af Amer: 44 mL/min — ABNORMAL LOW (ref >60)
Glucose, Bld: 112 mg/dL — ABNORMAL HIGH (ref 65–99)
Potassium: 4 mmol/L (ref 3.5–5.1)
Sodium: 141 mmol/L (ref 135–145)

## 2015-08-07 LAB — URINALYSIS, ROUTINE W REFLEX MICROSCOPIC
Bilirubin Urine: NEGATIVE
Glucose, UA: NEGATIVE mg/dL
Ketones, ur: NEGATIVE mg/dL
Leukocytes, UA: NEGATIVE
Nitrite: NEGATIVE
Protein, ur: NEGATIVE mg/dL
Specific Gravity, Urine: 1.012 (ref 1.005–1.030)
pH: 7.5 (ref 5.0–8.0)

## 2015-08-07 LAB — CBC WITH DIFFERENTIAL/PLATELET
Basophils Absolute: 0 10*3/uL (ref 0.0–0.1)
Basophils Relative: 0 %
Eosinophils Absolute: 0 10*3/uL (ref 0.0–0.7)
Eosinophils Relative: 0 %
HCT: 34.4 % — ABNORMAL LOW (ref 36.0–46.0)
Hemoglobin: 11.7 g/dL — ABNORMAL LOW (ref 12.0–15.0)
Lymphocytes Relative: 10 %
Lymphs Abs: 0.8 10*3/uL (ref 0.7–4.0)
MCH: 31.5 pg (ref 26.0–34.0)
MCHC: 34 g/dL (ref 30.0–36.0)
MCV: 92.5 fL (ref 78.0–100.0)
Monocytes Absolute: 0.4 10*3/uL (ref 0.1–1.0)
Monocytes Relative: 5 %
Neutro Abs: 6.5 10*3/uL (ref 1.7–7.7)
Neutrophils Relative %: 85 %
Platelets: 125 10*3/uL — ABNORMAL LOW (ref 150–400)
RBC: 3.72 MIL/uL — ABNORMAL LOW (ref 3.87–5.11)
RDW: 12.7 % (ref 11.5–15.5)
WBC: 7.7 10*3/uL (ref 4.0–10.5)

## 2015-08-07 LAB — URINE MICROSCOPIC-ADD ON

## 2015-08-07 LAB — CBG MONITORING, ED: Glucose-Capillary: 107 mg/dL — ABNORMAL HIGH (ref 65–99)

## 2015-08-07 MED ORDER — ENTACAPONE 200 MG PO TABS
200.0000 mg | ORAL_TABLET | Freq: Three times a day (TID) | ORAL | Status: DC
  Administered 2015-08-07 – 2015-08-11 (×12): 200 mg via ORAL
  Filled 2015-08-07 (×14): qty 1

## 2015-08-07 MED ORDER — MOMETASONE FURO-FORMOTEROL FUM 100-5 MCG/ACT IN AERO
2.0000 | INHALATION_SPRAY | Freq: Two times a day (BID) | RESPIRATORY_TRACT | Status: DC
  Administered 2015-08-07 – 2015-08-11 (×6): 2 via RESPIRATORY_TRACT
  Filled 2015-08-07: qty 8.8

## 2015-08-07 MED ORDER — FAMOTIDINE 20 MG PO TABS
20.0000 mg | ORAL_TABLET | Freq: Every day | ORAL | Status: DC
  Administered 2015-08-07 – 2015-08-11 (×5): 20 mg via ORAL
  Filled 2015-08-07 (×5): qty 1

## 2015-08-07 MED ORDER — CARBIDOPA-LEVODOPA 25-100 MG PO TABS
1.0000 | ORAL_TABLET | Freq: Three times a day (TID) | ORAL | Status: DC
  Administered 2015-08-07 – 2015-08-11 (×12): 1 via ORAL
  Filled 2015-08-07 (×14): qty 1

## 2015-08-07 MED ORDER — DEXTROSE 5 % IV SOLN
1.0000 g | Freq: Once | INTRAVENOUS | Status: AC
  Administered 2015-08-07: 1 g via INTRAVENOUS
  Filled 2015-08-07: qty 10

## 2015-08-07 MED ORDER — ENOXAPARIN SODIUM 30 MG/0.3ML ~~LOC~~ SOLN
30.0000 mg | SUBCUTANEOUS | Status: DC
  Administered 2015-08-07 – 2015-08-08 (×2): 30 mg via SUBCUTANEOUS
  Filled 2015-08-07 (×2): qty 0.3

## 2015-08-07 MED ORDER — OXYBUTYNIN CHLORIDE ER 10 MG PO TB24
10.0000 mg | ORAL_TABLET | Freq: Every day | ORAL | Status: DC
  Administered 2015-08-07 – 2015-08-10 (×4): 10 mg via ORAL
  Filled 2015-08-07 (×5): qty 1

## 2015-08-07 MED ORDER — DEXTROSE 5 % IV SOLN
1.0000 g | INTRAVENOUS | Status: DC
  Administered 2015-08-08 – 2015-08-10 (×3): 1 g via INTRAVENOUS
  Filled 2015-08-07 (×4): qty 10

## 2015-08-07 MED ORDER — ONDANSETRON HCL 4 MG/2ML IJ SOLN
4.0000 mg | Freq: Four times a day (QID) | INTRAMUSCULAR | Status: DC | PRN
Start: 2015-08-07 — End: 2015-08-11

## 2015-08-07 MED ORDER — ALPRAZOLAM 0.25 MG PO TABS
0.2500 mg | ORAL_TABLET | Freq: Two times a day (BID) | ORAL | Status: DC | PRN
  Administered 2015-08-07 – 2015-08-09 (×2): 0.25 mg via ORAL
  Filled 2015-08-07 (×2): qty 1

## 2015-08-07 MED ORDER — ANASTROZOLE 1 MG PO TABS
1.0000 mg | ORAL_TABLET | Freq: Every day | ORAL | Status: DC
Start: 2015-08-07 — End: 2015-08-11
  Administered 2015-08-07 – 2015-08-11 (×5): 1 mg via ORAL
  Filled 2015-08-07 (×5): qty 1

## 2015-08-07 MED ORDER — CARBIDOPA-LEVODOPA ER 50-200 MG PO TBCR
1.0000 | EXTENDED_RELEASE_TABLET | Freq: Every day | ORAL | Status: DC
  Administered 2015-08-07 – 2015-08-10 (×4): 1 via ORAL
  Filled 2015-08-07 (×5): qty 1

## 2015-08-07 MED ORDER — ONDANSETRON HCL 4 MG PO TABS: 4.0000 mg | ORAL_TABLET | Freq: Four times a day (QID) | ORAL | Status: DC | PRN

## 2015-08-07 MED ORDER — ACETAMINOPHEN 325 MG PO TABS
650.0000 mg | ORAL_TABLET | Freq: Four times a day (QID) | ORAL | Status: DC | PRN
  Administered 2015-08-07: 650 mg via ORAL
  Filled 2015-08-07 (×2): qty 2

## 2015-08-07 MED ORDER — SODIUM CHLORIDE 0.9 % IV SOLN
INTRAVENOUS | Status: DC
  Administered 2015-08-07 – 2015-08-08 (×2): via INTRAVENOUS

## 2015-08-07 MED ORDER — BISACODYL 5 MG PO TBEC: 5.0000 mg | DELAYED_RELEASE_TABLET | Freq: Every day | ORAL | Status: DC | PRN

## 2015-08-07 MED ORDER — ONDANSETRON HCL 4 MG PO TABS
4.0000 mg | ORAL_TABLET | Freq: Three times a day (TID) | ORAL | Status: DC | PRN
Start: 2015-08-07 — End: 2015-08-11

## 2015-08-07 MED ORDER — ACYCLOVIR 400 MG PO TABS
400.0000 mg | ORAL_TABLET | Freq: Two times a day (BID) | ORAL | Status: DC
  Administered 2015-08-07 – 2015-08-11 (×8): 400 mg via ORAL
  Filled 2015-08-07 (×8): qty 1

## 2015-08-07 NOTE — Telephone Encounter (Signed)
Patient's husband called to state she is not getting around well this morning. It took 20 minutes to get her from bedroom to the bathroom and she would have fell several times if he wasn't holding her up. Spoke with Dr Tat and advised evaluation in the ER since he can not get her to appt here today (or to PCP). Patient's husband made aware.

## 2015-08-07 NOTE — ED Provider Notes (Signed)
CSN: [id number]     Arrival date & time 08/07/15  1052 History   First MD Initiated Contact with Patient 08/07/15 1111     Chief Complaint  Patient presents with  . Weakness  . Altered Mental Status     (Consider location/radiation/quality/duration/timing/severity/associated sxs/prior Treatment) HPI   82 year old female presenting with husband for evaluation of generalized weakness and changes in mental status. Slowly progressing over the last 1-2 weeks. Last night was hallucinating and did not sleep well. Kept talking to herself. For past day has been fidgeting with her hands and reaching for things in front of her. This morning before arrival he literally had to carry her. He says she was dead weight but also that "it seemed like she couldn't get her brain to coordinate with her feet." Her urine has seemed darker in color and he also noticed a small amount of blood in it. She has not voice any specific complaints to him recently. No recent falls/trauma. No medications changes. Hx of Parkinson's but reports symptoms primarily tremor. At baseline can carry on coherent conversation and ambulate unassisted.   Past Medical History  Diagnosis Date  . Asthma   . Hearing loss   . Diabetes mellitus without complication ([place])     [id number].Marland KitchenMarland Kitchenpt denies  . GERD (gastroesophageal reflux disease)   . NHL (non-Hodgkin's lymphoma) ([place])     nhl dx 9/04 breast ca dx1/12  . Depression   . Anxiety   . Parkinson disease [facility])    Past Surgical History  Procedure Laterality Date  . Mastectomy  2 /8/ 12    bilateral  . Ba-ha ear implant    . Mastectomy     Family History  Problem Relation Age of Onset  . Lung cancer Father   . Cancer Father     lung  . Cancer Mother    Social History  Substance Use Topics  . Smoking status: Never Smoker   . Smokeless tobacco: Never Used     Comment: husband wsa smoker  . Alcohol Use: No     Comment: no   OB History    No data available     Review of  Systems  All systems reviewed and negative, other than as noted in HPI.   Allergies  Review of patient's allergies indicates no known allergies.  Home Medications   Prior to Admission medications   Medication Sig Start Date End Date Taking? Authorizing Provider  acetaminophen (TYLENOL) 325 MG tablet Take 650 mg by mouth every 6 (six) hours as needed for moderate pain.    Yes Historical Provider, MD  acyclovir (ZOVIRAX) 400 MG tablet TAKE 1 TABLET BY MOUTH TWICE DAILY 07/08/15  Yes [name], MD  ALPRAZolam Duanne Moron) 0.25 MG tablet Take 0.25 mg by mouth 2 (two) times daily as needed for anxiety. Patient taking approx 2 times weekly   Yes Historical Provider, MD  anastrozole (ARIMIDEX) 1 MG tablet TAKE ONE (1) TABLET BY MOUTH EVERY DAY 07/15/15  Yes [name], MD  bisacodyl (DULCOLAX) 5 MG EC tablet Take 5 mg by mouth daily as needed for moderate constipation.   Yes Historical Provider, MD  CALCIUM PO Take 600 mg by mouth 2 (two) times daily.   Yes Historical Provider, MD  carbidopa-levodopa (SINEMET CR) 50-200 MG per tablet Take 1 tablet by mouth at bedtime. 03/05/15  Yes Rebecca S Tat, DO  carbidopa-levodopa (SINEMET IR) 25-100 MG per tablet Take 1 tablet in the morning, 1 tablet in  the afternoon, 2 tablets in the evening Patient taking differently: Take 1 tablet by mouth in the morning, 1 tablet in the afternoon, 2 tablets in the evening 03/04/15  Yes Rebecca S Tat, DO  cetirizine (ZYRTEC) 10 MG tablet Take 10 mg by mouth daily.   Yes Historical Provider, MD  cholecalciferol (VITAMIN D) 1000 units tablet Take 1,000 Units by mouth daily.   Yes Historical Provider, MD  entacapone (COMTAN) 200 MG tablet TAKE 1 TABLET (200 MG TOTAL) BY MOUTH 3 (THREE) TIMES DAILY. 01/01/15  Yes Rebecca S Tat, DO  Fluticasone-Salmeterol (ADVAIR DISKUS) 100-50 MCG/DOSE AEPB Inhale 1 puff into the lungs every 12 (twelve) hours.    Yes Historical Provider, MD  Multiple Vitamins-Minerals (MULTIVITAMIN WITH  MINERALS) tablet Take 1 tablet by mouth daily.     Yes Historical Provider, MD  ondansetron (ZOFRAN) 8 MG tablet Take by mouth every 8 (eight) hours as needed for nausea or vomiting.   Yes Historical Provider, MD  oxybutynin (DITROPAN-XL) 10 MG 24 hr tablet Take 10 mg by mouth at bedtime.   Yes Historical Provider, MD  ranitidine (ZANTAC) 300 MG tablet Take 300 mg by mouth at bedtime.    Yes Historical Provider, MD  zinc gluconate 50 MG tablet Take 50 mg by mouth daily.   Yes Historical Provider, MD  acyclovir (ZOVIRAX) 400 MG tablet TAKE 1 TABLET BY MOUTH TWICE DAILY Patient not taking: Reported on 08/07/2015 07/23/15   [name], MD  buPROPion (WELLBUTRIN XL) 150 MG 24 hr tablet Take 1 tablet (150 mg total) by mouth daily. Patient not taking: Reported on 08/07/2015 06/11/15   Eustace Quail Tat, DO  buPROPion (WELLBUTRIN XL) 300 MG 24 hr tablet Take 1 tablet (300 mg total) by mouth daily. Patient not taking: Reported on 08/07/2015 06/11/15   Rebecca S Tat, DO   BP 185/90 mmHg  Pulse 78  Temp(Src) 99.7 F (37.6 C) (Rectal)  Resp 20  Ht 5\' 2"  (1.575 m)  Wt 106 lb (48.081 kg)  BMI 19.38 kg/m2  SpO2 98% Physical Exam  Constitutional: She appears well-developed and well-nourished. No distress.  HENT:  Head: Normocephalic and atraumatic.  Eyes: Conjunctivae are normal. Right eye exhibits no discharge. Left eye exhibits no discharge.  Neck: Neck supple.  Cardiovascular: Normal rate, regular rhythm and normal heart sounds.  Exam reveals no gallop and no friction rub.   No murmur heard. Pulmonary/Chest: Effort normal and breath sounds normal. No respiratory distress.  Abdominal: Soft. She exhibits no distension. There is no tenderness.  Musculoskeletal: She exhibits no edema or tenderness.  Neurological: She is alert.  Oriented to self only. Follows commands. Speech clear. Answers to some questions are inappropriate. Somewhat hard of hearing, otherwise CN 2-12 intact. Strength 5/5 b/l u/l  ext.   Skin: Skin is warm and dry.  Nursing note and vitals reviewed.   ED Course  Procedures (including critical care time) Labs Review Labs Reviewed  CBC WITH DIFFERENTIAL/PLATELET - Abnormal; Notable for the following:    RBC 3.72 (*)    Hemoglobin 11.7 (*)    HCT 34.4 (*)    Platelets 125 (*)    All other components within normal limits  BASIC METABOLIC PANEL - Abnormal; Notable for the following:    Glucose, Bld 112 (*)    Creatinine, Ser 1.10 (*)    GFR calc non Af Amer 44 (*)    GFR calc Af Amer 51 (*)    All other components within normal limits  URINALYSIS, ROUTINE W REFLEX MICROSCOPIC (NOT AT [facility]) - Abnormal; Notable for the following:    Color, Urine AMBER (*)    APPearance CLOUDY (*)    Hgb urine dipstick TRACE (*)    All other components within normal limits  URINE MICROSCOPIC-ADD ON - Abnormal; Notable for the following:    Squamous Epithelial / LPF 0-5 (*)    Bacteria, UA MANY (*)    All other components within normal limits  CBG MONITORING, ED - Abnormal; Notable for the following:    Glucose-Capillary 107 (*)    All other components within normal limits  URINE CULTURE    Imaging Review No results found. I have personally reviewed and evaluated these images and lab results as part of my medical decision-making.   EKG Interpretation   Date/Time:  Wednesday August 07 2015 11:20:16 EST Ventricular Rate:  74 PR Interval:  154 QRS Duration: 93 QT Interval:  404 QTC Calculation: 448 R Axis:   39 Text Interpretation:  Sinus rhythm Minimal ST elevation, anterior leads  Baseline wander in lead(s) V6 ED PHYSICIAN INTERPRETATION AVAILABLE IN  [facility] Confirmed by TEST, Record ([id number]) on 08/08/2015 7:06:28 AM      MDM   Final diagnoses:  Delirium  UTI (lower urinary tract infection)    82 year old female with changes in mental status/delirium. Suspect this may be secondary to urinary tract infection. Many bacteria and trace blood noted on  urinalysis although not much in terms of WBC. Will send culture. Will treat based on symptoms. No obvious alternative explanation for her delirium at this point. May need to be pursued further if does not respond with treatment of possible UTI.    Virgel Manifold, MD 08/09/15 [id number]

## 2015-08-07 NOTE — H&P (Signed)
[facility] History and Physical  Navdeep Volo [id number] DOB: 1928/02/13 DOA: 08/07/2015  Referring physician: ED physician, Dr. Wilson Singer  PCP: [name], MD   Chief Complaint: confusion   HPI:  Patient is 82 year old female with known history of breast cancer status post bilateral mastectomy, Parkinson's disease, lives at home with husband, presented to [facility] emergency department with 1-2 days duration of progressively worsening confusion. Please note that patient is unable to provide any history at the time of the admission due to confusion and husband and bedside provided most of the details. Husband explains that patient has been in her usual state of health until several days ago when he noted intermittent confusion, unable to orient herself, alert and oriented only to name and date of birth per husband. Husband is not aware of any fevers and chills but has noted her urine was malodorous and it seemed like she was going to the restroom more often. Husband denies noticing any chest pain or shortness of breath, no specific abdominal concerns.  In emergency department, patient very pleasant but confused, vital signs stable, blood work notable for platelets 125, creatinine 1.10. Urinalysis consistent with urinary tract infection, patient given 1 dose of Rocephin IV and TRH asked to admit for further evaluation. Medical bed requested.  Assessment and Plan:  Acute encephalopathy - Appears to be related to urinary tract infection and dehydration - Agree with admission for observation, medical unit appropriate - Placed on Rocephin 1 g IV, provide IV fluids and monitor clinical response - May need PT evaluation prior to discharge  UTI - Placed on Rocephin as noted above and follow-up and urine cultures so that we can appropriately narrow down antibiotic  Parkinson's disease - Continue home medical regimen  Acute kidney injury - Prerenal in etiology - Provide IV  fluids and repeat BMP in the morning  Thrombocytopenia - Monitor for signs of bleeding, CBC in the morning  Lovenox for DVT prophylaxis  Radiological Exams on Admission: No results found.   Code Status: Full Family Communication: Pt and husband at bedside Disposition Plan: Admit for further evaluation    [facility] [id number]   Review of Systems:  Constitutional: Negative for diaphoresis.  HENT: Negative for hearing loss, ear pain, nosebleeds, congestion, sore throat, neck pain, tinnitus and ear discharge.   Eyes: Negative for blurred vision, double vision, photophobia, pain, discharge and redness.  Respiratory: Negative for cough, hemoptysis, sputum production, shortness of breath, wheezing and stridor.   Cardiovascular: Negative for chest pain, palpitations, orthopnea, claudication and leg swelling.  Gastrointestinal: Negative for nausea, vomiting and abdominal pain. Negative for heartburn, constipation, blood in stool and melena.  Genitourinary: per HPI Musculoskeletal: Negative for myalgias, back pain, joint pain and falls.  Skin: Negative for itching and rash.  Neurological: Negative for dizziness and weakness.  Endo/Heme/Allergies: Negative for environmental allergies and polydipsia. Does not bruise/bleed easily.  Psychiatric/Behavioral: Negative for suicidal ideas. The patient is not nervous/anxious.      Past Medical History  Diagnosis Date  . Asthma   . Hearing loss   . Diabetes mellitus without complication ([place])     [id number].Marland KitchenMarland Kitchenpt denies  . GERD (gastroesophageal reflux disease)   . NHL (non-Hodgkin's lymphoma) ([place])     nhl dx 9/04 breast ca dx1/12  . Depression   . Anxiety   . Parkinson disease [facility])     Past Surgical History  Procedure Laterality Date  . Mastectomy  2 /8/ 12    bilateral  .  Ba-ha ear implant    . Mastectomy      Social History:  reports that she has never smoked. She has never used smokeless tobacco. She reports that she does  not drink alcohol or use illicit drugs.  No Known Allergies  Family History  Problem Relation Age of Onset  . Lung cancer Father   . Cancer Father     lung  . Cancer Mother     Medication Sig  acetaminophen (TYLENOL) 325 MG tablet Take 650 mg by mouth every 6 (six) hours as needed for moderate pain.   acyclovir (ZOVIRAX) 400 MG tablet TAKE 1 TABLET BY MOUTH TWICE DAILY  ALPRAZolam (XANAX) 0.25 MG tablet Take 0.25 mg by mouth 2 (two) times daily as needed for anxiety. Patient taking approx 2 times weekly  anastrozole (ARIMIDEX) 1 MG tablet TAKE ONE (1) TABLET BY MOUTH EVERY DAY  bisacodyl (DULCOLAX) 5 MG EC tablet Take 5 mg by mouth daily as needed for moderate constipation.  CALCIUM PO Take 600 mg by mouth 2 (two) times daily.  carbidopa-levodopa (SINEMET CR) 50-200 MG per tablet Take 1 tablet by mouth at bedtime.  carbidopa-levodopa (SINEMET IR) 25-100 MG per tablet Take 1 tablet in the morning, 1 tablet in the afternoon, 2 tablets in the evening Patient taking differently: Take 1 tablet by mouth in the morning, 1 tablet in the afternoon, 2 tablets in the evening  cetirizine (ZYRTEC) 10 MG tablet Take 10 mg by mouth daily.  cholecalciferol (VITAMIN D) 1000 units tablet Take 1,000 Units by mouth daily.  entacapone (COMTAN) 200 MG tablet TAKE 1 TABLET (200 MG TOTAL) BY MOUTH 3 (THREE) TIMES DAILY.  Fluticasone-Salmeterol (ADVAIR DISKUS) 100-50 MCG/DOSE AEPB Inhale 1 puff into the lungs every 12 (twelve) hours.   Multiple Vitamins-Minerals (MULTIVITAMIN WITH MINERALS) tablet Take 1 tablet by mouth daily.    ondansetron (ZOFRAN) 8 MG tablet Take by mouth every 8 (eight) hours as needed for nausea or vomiting.  oxybutynin (DITROPAN-XL) 10 MG 24 hr tablet Take 10 mg by mouth at bedtime.  ranitidine (ZANTAC) 300 MG tablet Take 300 mg by mouth at bedtime.   zinc gluconate 50 MG tablet Take 50 mg by mouth daily.  acyclovir (ZOVIRAX) 400 MG tablet TAKE 1 TABLET BY MOUTH TWICE DAILY Patient not  taking: Reported on 08/07/2015  buPROPion (WELLBUTRIN XL) 150 MG 24 hr tablet Take 1 tablet (150 mg total) by mouth daily. Patient not taking: Reported on 08/07/2015  buPROPion (WELLBUTRIN XL) 300 MG 24 hr tablet Take 1 tablet (300 mg total) by mouth daily. Patient not taking: Reported on 08/07/2015    Physical Exam: Filed Vitals:   08/07/15 1100 08/07/15 1119 08/07/15 1146  BP:  185/90   Pulse:  78   Temp:  99.4 F (37.4 C) 99.7 F (37.6 C)  TempSrc:  Oral Rectal  Resp:  20   Height:  5\' 2"  (1.575 m)   Weight:  48.081 kg (106 lb)   SpO2: 96% 98%     Physical Exam  Constitutional: Appears calm, NAD HENT: Normocephalic. External right and left ear normal. Dry MM Eyes: Conjunctivae and EOM are normal. PERRLA, no scleral icterus.  Neck: Normal ROM. Neck supple. No JVD. No tracheal deviation. No thyromegaly.  CVS: RRR, no gallops, no carotid bruit.  Pulmonary: Effort and breath sounds normal, no stridor, rhonchi, wheezes, rales.  Abdominal: Soft. BS +,  no distension, tenderness, rebound or guarding.  Musculoskeletal: Normal range of motion. Lymphadenopathy: No lymphadenopathy noted, cervical, inguinal.  Neuro: Alert. Intermittently confused, strength equal in upper and lower extremities bilaterally with no focal neurological deficit Skin: Skin is warm and dry. No rash noted. Not diaphoretic. No erythema. No pallor.  Psychiatric: Normal mood and affect.   Labs on Admission:  Basic Metabolic Panel:  Recent Labs Lab 08/07/15 1141  NA 141  K 4.0  CL 103  CO2 27  GLUCOSE 112*  BUN 15  CREATININE 1.10*  CALCIUM 9.4   CBC:  Recent Labs Lab 08/07/15 1141  WBC 7.7  NEUTROABS 6.5  HGB 11.7*  HCT 34.4*  MCV 92.5  PLT 125*   CBG:  Recent Labs Lab 08/07/15 1126  GLUCAP 107*    EKG: pending  If 7PM-7AM, please contact night-coverage [email or web address] Password [email or web address] 08/07/2015, 1:31 PM

## 2015-08-07 NOTE — ED Notes (Signed)
Per [facility]- Pt resides at home. FULL CODE. Per family pt has increased confusion and weakness since 07/23/2015. Pt is ambulatory however recently has needed cane and or walker. Over the last several days too weak for these devices. Denies N/V/D and fever. UTI symptoms present. Hallucinations new

## 2015-08-08 DIAGNOSIS — J45909 Unspecified asthma, uncomplicated: Secondary | ICD-10-CM | POA: Diagnosis present

## 2015-08-08 DIAGNOSIS — C8590 Non-Hodgkin lymphoma, unspecified, unspecified site: Secondary | ICD-10-CM | POA: Diagnosis present

## 2015-08-08 DIAGNOSIS — F329 Major depressive disorder, single episode, unspecified: Secondary | ICD-10-CM | POA: Diagnosis present

## 2015-08-08 DIAGNOSIS — D696 Thrombocytopenia, unspecified: Secondary | ICD-10-CM | POA: Diagnosis present

## 2015-08-08 DIAGNOSIS — G9340 Encephalopathy, unspecified: Secondary | ICD-10-CM | POA: Diagnosis not present

## 2015-08-08 DIAGNOSIS — Z801 Family history of malignant neoplasm of trachea, bronchus and lung: Secondary | ICD-10-CM | POA: Diagnosis not present

## 2015-08-08 DIAGNOSIS — G20 Parkinson's disease: Secondary | ICD-10-CM | POA: Diagnosis not present

## 2015-08-08 DIAGNOSIS — N179 Acute kidney failure, unspecified: Secondary | ICD-10-CM | POA: Diagnosis not present

## 2015-08-08 DIAGNOSIS — H9190 Unspecified hearing loss, unspecified ear: Secondary | ICD-10-CM | POA: Diagnosis present

## 2015-08-08 DIAGNOSIS — F419 Anxiety disorder, unspecified: Secondary | ICD-10-CM | POA: Diagnosis present

## 2015-08-08 DIAGNOSIS — Z809 Family history of malignant neoplasm, unspecified: Secondary | ICD-10-CM | POA: Diagnosis not present

## 2015-08-08 DIAGNOSIS — Z79899 Other long term (current) drug therapy: Secondary | ICD-10-CM | POA: Diagnosis not present

## 2015-08-08 DIAGNOSIS — K219 Gastro-esophageal reflux disease without esophagitis: Secondary | ICD-10-CM | POA: Diagnosis present

## 2015-08-08 DIAGNOSIS — R410 Disorientation, unspecified: Secondary | ICD-10-CM | POA: Diagnosis not present

## 2015-08-08 DIAGNOSIS — N390 Urinary tract infection, site not specified: Principal | ICD-10-CM

## 2015-08-08 DIAGNOSIS — Z9013 Acquired absence of bilateral breasts and nipples: Secondary | ICD-10-CM | POA: Diagnosis not present

## 2015-08-08 DIAGNOSIS — C50919 Malignant neoplasm of unspecified site of unspecified female breast: Secondary | ICD-10-CM | POA: Diagnosis present

## 2015-08-08 DIAGNOSIS — E119 Type 2 diabetes mellitus without complications: Secondary | ICD-10-CM | POA: Diagnosis present

## 2015-08-08 LAB — URINE CULTURE: Culture: NO GROWTH

## 2015-08-08 LAB — BASIC METABOLIC PANEL
ANION GAP: 9 (ref 5–15)
BUN: 13 mg/dL (ref 6–20)
CHLORIDE: 103 mmol/L (ref 101–111)
CO2: 29 mmol/L (ref 22–32)
CREATININE: 0.96 mg/dL (ref 0.44–1.00)
Calcium: 8.7 mg/dL — ABNORMAL LOW (ref 8.9–10.3)
GFR, EST NON AFRICAN AMERICAN: 52 mL/min — AB (ref >60)
GLUCOSE: 101 mg/dL — AB (ref 65–99)
POTASSIUM: 3.5 mmol/L (ref 3.5–5.1)
Sodium: 141 mmol/L (ref 135–145)

## 2015-08-08 LAB — CBC
HCT: 32.1 % — ABNORMAL LOW (ref 36.0–46.0)
Hemoglobin: 11.1 g/dL — ABNORMAL LOW (ref 12.0–15.0)
MCH: 32.1 pg (ref 26.0–34.0)
MCHC: 34.6 g/dL (ref 30.0–36.0)
MCV: 92.8 fL (ref 78.0–100.0)
PLATELETS: 121 10*3/uL — AB (ref 150–400)
RBC: 3.46 MIL/uL — ABNORMAL LOW (ref 3.87–5.11)
RDW: 12.7 % (ref 11.5–15.5)
WBC: 5.6 10*3/uL (ref 4.0–10.5)

## 2015-08-08 MED ORDER — POTASSIUM CHLORIDE CRYS ER 20 MEQ PO TBCR
20.0000 meq | EXTENDED_RELEASE_TABLET | Freq: Once | ORAL | Status: AC
  Administered 2015-08-08: 20 meq via ORAL
  Filled 2015-08-08: qty 1

## 2015-08-08 MED ORDER — LIP MEDEX EX OINT
TOPICAL_OINTMENT | CUTANEOUS | Status: AC
  Administered 2015-08-08: 17:00:00
  Filled 2015-08-08: qty 7

## 2015-08-08 NOTE — Progress Notes (Signed)
[facility] PROGRESS NOTE  Analucia Wilburn [id number] DOB: [birth year] DOA: 08/07/2015  PCP: [name], MD  Brief HPI: 82 year old Caucasian female who lives with her husband with a known history of breast cancer, Parkinson's disease was entered with 1-2 day history of worsening confusion. She was found to have an abnormal UA. There was history of frequent urination. Her confusion was thought to be secondary to urinary tract infection. She was hospitalized for further management.  Past medical history:  Past Medical History  Diagnosis Date  . Asthma   . Hearing loss   . Diabetes mellitus without complication ([place])     [id number].Marland KitchenMarland Kitchenpt denies  . GERD (gastroesophageal reflux disease)   . NHL (non-Hodgkin's lymphoma) ([place])     nhl dx 9/04 breast ca dx1/12  . Depression   . Anxiety   . Parkinson disease [facility])     Consultants: None  Procedures: None  Antibiotics: Ceftriaxone  Subjective: Patient continues to be pleasantly confused. She did identify her husband. She denied any pain. Could not tell me where she was.  Objective: Vital Signs  Filed Vitals:   08/07/15 1925 08/07/15 2052 08/08/15 0527 08/08/15 1132  BP:  189/87 155/86   Pulse:  79 68   Temp:  99.3 F (37.4 C) 97.7 F (36.5 C)   TempSrc:  Oral Oral   Resp:  16 18   Height:      Weight:      SpO2: 96% 98% 98% 97%    Intake/Output Summary (Last 24 hours) at 08/08/15 1241 Last data filed at 08/07/15 1446  Gross per 24 hour  Intake    125 ml  Output    100 ml  Net     25 ml   Filed Weights   08/07/15 1119 08/07/15 1541  Weight: 48.081 kg (106 lb) 47.718 kg (105 lb 3.2 oz)    General appearance: alert, cooperative, appears stated age, distracted and no distress Resp: clear to auscultation bilaterally Cardio: S1, S2 is normal, regular. Systolic murmur appreciated over the precordium. No S3, S4. No rubs, or bruit. GI: Mild discomfort in the suprapubic area. Otherwise abdomen soft and  benign. No masses or organomegaly. Extremities: extremities normal, atraumatic, no cyanosis or edema Pulses: 2+ and symmetric Neurologic: Alert. Distracted. Disoriented. No cranial nerve deficits. Motor strength is equal bilateral upper and lower extremities.  Lab Results:  Basic Metabolic Panel:  Recent Labs Lab 08/07/15 1141 08/08/15 0412  NA 141 141  K 4.0 3.5  CL 103 103  CO2 27 29  GLUCOSE 112* 101*  BUN 15 13  CREATININE 1.10* 0.96  CALCIUM 9.4 8.7*   CBC:  Recent Labs Lab 08/07/15 1141 08/08/15 0412  WBC 7.7 5.6  NEUTROABS 6.5  --   HGB 11.7* 11.1*  HCT 34.4* 32.1*  MCV 92.5 92.8  PLT 125* 121*   CBG:  Recent Labs Lab 08/07/15 1126  GLUCAP 107*    Recent Results (from the past 240 hour(s))  Urine culture     Status: None   Collection Time: 08/07/15  1:35 PM  Result Value Ref Range Status   Specimen Description URINE, CATHETERIZED  Final   Special Requests NONE  Final   Culture   Final    NO GROWTH 1 DAY Performed at [facility]    Report Status 08/08/2015 FINAL  Final      Studies/Results: No results found.  Medications:  Scheduled: . acyclovir  400 mg Oral BID  . anastrozole  1  mg Oral Daily  . carbidopa-levodopa  1 tablet Oral QHS  . carbidopa-levodopa  1 tablet Oral TID  . cefTRIAXone (ROCEPHIN)  IV  1 g Intravenous Q24H  . enoxaparin (LOVENOX) injection  30 mg Subcutaneous Q24H  . entacapone  200 mg Oral TID  . famotidine  20 mg Oral Daily  . mometasone-formoterol  2 puff Inhalation BID  . oxybutynin  10 mg Oral QHS   Continuous: . sodium chloride 50 mL/hr at 08/08/15 [id number]   [id number], ALPRAZolam, bisacodyl, ondansetron **OR** ondansetron (ZOFRAN) IV, ondansetron  Assessment/Plan:  Principal Problem:   Acute encephalopathy Active Problems:   Malignant neoplasm of female breast (HCC)   Confusion   UTI (lower urinary tract infection)   Acute kidney failure (HCC)    Acute encephalopathy Appears to be  related to urinary tract infection and dehydration. Patient does not have any focal neurological deficits. Continue IV antibiotics. Discussed with her husband today. Mental status not much improved compared to yesterday.  UTI Culture reports are pending. Continue ceftriaxone.  Parkinson's disease Continue home medical regimen  Acute kidney injury Prerenal in etiology. Was mild. Resolved with IV fluids.  Thrombocytopenia Platelet counts are stable. Continue to monitor. No bleeding.  History of breast cancer Continue anastrozole  History of non-Hodgkin's lymphoma Currently in clinical remission.  DVT Prophylaxis: Lovenox    Code Status: Full code  Family Communication: Discussed with the patient's husband  Disposition Plan: Continue current treatment. PT and OT evaluation.      [facility]  [facility] Pager [phone] 08/08/2015, 12:41 PM  If 7PM-7AM, please contact night-coverage at [email or web address], password [facility]

## 2015-08-09 ENCOUNTER — Inpatient Hospital Stay (HOSPITAL_COMMUNITY): Payer: Medicare Other

## 2015-08-09 ENCOUNTER — Encounter (HOSPITAL_COMMUNITY): Payer: Self-pay | Admitting: Radiology

## 2015-08-09 LAB — CBC
HCT: 32.3 % — ABNORMAL LOW (ref 36.0–46.0)
Hemoglobin: 11.3 g/dL — ABNORMAL LOW (ref 12.0–15.0)
MCH: 32.2 pg (ref 26.0–34.0)
MCHC: 35 g/dL (ref 30.0–36.0)
MCV: 92 fL (ref 78.0–100.0)
PLATELETS: 139 10*3/uL — AB (ref 150–400)
RBC: 3.51 MIL/uL — AB (ref 3.87–5.11)
RDW: 12.6 % (ref 11.5–15.5)
WBC: 5.8 10*3/uL (ref 4.0–10.5)

## 2015-08-09 LAB — BASIC METABOLIC PANEL
Anion gap: 9 (ref 5–15)
BUN: 9 mg/dL (ref 6–20)
CALCIUM: 8.3 mg/dL — AB (ref 8.9–10.3)
CO2: 24 mmol/L (ref 22–32)
CREATININE: 0.72 mg/dL (ref 0.44–1.00)
Chloride: 107 mmol/L (ref 101–111)
GFR calc Af Amer: >60 mL/min (ref >60)
GLUCOSE: 104 mg/dL — AB (ref 65–99)
POTASSIUM: 3.6 mmol/L (ref 3.5–5.1)
SODIUM: 140 mmol/L (ref 135–145)

## 2015-08-09 MED ORDER — ENOXAPARIN SODIUM 40 MG/0.4ML ~~LOC~~ SOLN
40.0000 mg | SUBCUTANEOUS | Status: DC
  Administered 2015-08-09 – 2015-08-10 (×2): 40 mg via SUBCUTANEOUS
  Filled 2015-08-09 (×2): qty 0.4

## 2015-08-09 NOTE — Evaluation (Signed)
Clinical/Bedside Swallow Evaluation Patient Details  Name: [name] MRN: [id number] Date of Birth: 22-Jan-1928  Today's Date: 08/09/2015 Time: SLP Start Time (ACUTE ONLY): 43 SLP Stop Time (ACUTE ONLY): 1135 SLP Time Calculation (min) (ACUTE ONLY): 30 min  Past Medical History:  Past Medical History  Diagnosis Date  . Asthma   . Hearing loss   . Diabetes mellitus without complication ([place])     [id number].Marland KitchenMarland Kitchenpt denies  . GERD (gastroesophageal reflux disease)   . Depression   . Anxiety   . Parkinson disease ([place])   . NHL (non-Hodgkin's lymphoma) ([place])     nhl dx 9/04 breast ca dx1/12   Past Surgical History:  Past Surgical History  Procedure Laterality Date  . Mastectomy  2 /8/ 12    bilateral  . Ba-ha ear implant    . Mastectomy     HPI:  82 year old Caucasian female who lives with her husband with a known history of breast cancer, Parkinson's disease, hearing loss admitted with worsening confusion thought to be secondary to urinary tract infection/   Assessment / Plan / Recommendation Clinical Impression  Patient presents with a moderate oral dysphagia and mild pharyngeal dysphagia, characterized by prolonged mastication and oral transit with puree solids and regular solids, piecemeal swallowing, and suspected delayed swallow initiation and decreased pharyngeal contraction. Patient did not exhibit any overt s/s of aspiration.     Aspiration Risk  Mild aspiration risk    Diet Recommendation Dysphagia 3 (Mech soft);Thin liquid   Liquid Administration via: Straw;Cup Medication Administration: Whole meds with liquid Supervision: Full supervision/cueing for compensatory strategies;Staff to assist with self feeding Compensations: Minimize environmental distractions;Slow rate;Small sips/bites Postural Changes: Seated upright at 90 degrees    Other  Recommendations Oral Care Recommendations: Oral care BID   Follow up Recommendations  Outpatient SLP    Frequency and  Duration min 2x/week  2 weeks       Prognosis Prognosis for Safe Diet Advancement: [place] Date of Onset: 08/07/15 HPI: 82 year old Caucasian female who lives with her husband with a known history of breast cancer, Parkinson's disease, hearing loss admitted with worsening confusion thought to be secondary to urinary tract infection/ Type of Study: Bedside Swallow Evaluation Previous Swallow Assessment: N/A Diet Prior to this Study: Regular;Thin liquids Temperature Spikes Noted: No Respiratory Status: Room air History of Recent Intubation: No Behavior/Cognition: Alert;Cooperative;Pleasant mood;Confused Oral Cavity Assessment: Within Functional Limits Oral Care Completed by SLP: Yes Oral Cavity - Dentition: Dentures, bottom;Dentures, top Vision: Impaired for self-feeding (secondary to parkinson's (tremors of UE)) Self-Feeding Abilities: Needs assist;Needs set up Patient Positioning: Upright in bed Baseline Vocal Quality: Low vocal intensity Volitional Cough: Weak Volitional Swallow: Able to elicit    Oral/Motor/Sensory Function Overall Oral Motor/Sensory Function: Moderate impairment Facial Symmetry: Within Functional Limits Facial Strength: Reduced right;Reduced left Lingual ROM: Reduced right;Reduced left Lingual Strength: Reduced Mandible: Impaired   Ice Chips Ice chips: Not tested   Thin Liquid Thin Liquid: Within functional limits Presentation: Cup;Straw Other Comments: No overt s/s of aspiration    Nectar Thick Nectar Thick Liquid: Not tested   Honey Thick Honey Thick Liquid: Not tested   Puree Puree: Impaired Presentation: Spoon Oral Phase Impairments: Reduced lingual movement/coordination;Impaired mastication (chewing behavior noted and piecemeal swallowing) Pharyngeal Phase Impairments: Suspected delayed Swallow;Decreased hyoid-laryngeal movement   Solid   GO   Solid: Impaired Oral Phase Impairments: Impaired mastication;Reduced  lingual movement/coordination Oral Phase Functional Implications: Prolonged oral transit Pharyngeal Phase  Impairments: Suspected delayed Swallow        Dannial Monarch 08/09/2015,4:01 PM     Sonia Baller, Rachel, CCC-SLP 08/09/2015 4:01 PM

## 2015-08-09 NOTE — Evaluation (Signed)
Physical Therapy Evaluation Patient Details Name: [name] MRN: [id number] DOB: 1927-07-29 Today's Date: 08/09/2015   History of Present Illness  82 year old Caucasian female who lives with her husband with a known history of breast cancer, Parkinson's disease, hearing loss admitted with worsening confusion thought to be secondary to urinary tract infection  Clinical Impression  Pt admitted with above diagnosis. Pt currently with functional limitations due to the deficits listed below (see PT Problem List).  Pt will benefit from skilled PT to increase their independence and safety with mobility to allow discharge to the venue listed below.   Spouse present and provided information on PLOF.  Spouse also assisted pt with ambulation.  Pt currently min-mod assist today however will likely progress well.  Spouse states he will likely be able to take care of pt upon d/c however if pt does not improve, may need SNF.     Follow Up Recommendations Home health PT;Supervision/Assistance - 24 hour (may need SNF if pt does not progress)    Equipment Recommendations  Rolling walker with 5" wheels    Recommendations for Other Services       Precautions / Restrictions Precautions Precautions: Fall      Mobility  Bed Mobility Overal bed mobility: Needs Assistance Bed Mobility: Supine to Sit;Sit to Supine     Supine to sit: Mod assist Sit to supine: Min assist   General bed mobility comments: assist for upper body upright and LEs onto bed  Transfers Overall transfer level: Needs assistance Equipment used: 2 person hand held assist Transfers: Sit to/from [facility] Sit to Stand: Mod assist Stand pivot transfers: Mod assist       General transfer comment: initially mod assist for weakness and steadying with transfer to [facility] however improved to min assist upon return to bed  Ambulation/Gait Ambulation/Gait assistance: Min assist;+2 safety/equipment Ambulation  Distance (Feet): 160 Feet Assistive device: 2 person hand held assist Gait Pattern/deviations: Step-through pattern;Narrow base of support;Decreased stride length     General Gait Details: provided 2 HHA with spouse assisting, min assist for balance  Stairs            Wheelchair Mobility    Modified Rankin (Stroke Patients Only)       Balance Overall balance assessment: Needs assistance         Standing balance support: Bilateral upper extremity supported;During functional activity Standing balance-Leahy Scale: Poor Standing balance comment: requiring UEs for support and min assist for steadying                             Pertinent Vitals/Pain Pain Assessment: No/denies pain    Home Living Family/patient expects to be discharged to:: Private residence Living Arrangements: Spouse/significant other   Type of Home: House Home Access: Stairs to enter   Technical brewer of Steps: "a couple" Home Layout: One level Home Equipment: Cane - single point      Prior Function Level of Independence: Needs assistance   Gait / Transfers Assistance Needed: spouse reports pt ambulatory at home without assistive device, likely supervision level           Hand Dominance        Extremity/Trunk Assessment   Upper Extremity Assessment: Generalized weakness           Lower Extremity Assessment: Generalized weakness         Communication   Communication: HOH  Cognition Arousal/Alertness: Awake/alert Behavior During Therapy: [facility]  for tasks assessed/performed Overall Cognitive Status: Impaired/Different from baseline Area of Impairment: Following commands;Safety/judgement       Following Commands: Follows one step commands consistently Safety/Judgement: Decreased awareness of safety     General Comments: spouse reports pt not yet at baseline but improved since admission    General Comments      Exercises        Assessment/Plan     PT Assessment Patient needs continued PT services  PT Diagnosis Difficulty walking   PT Problem List Decreased strength;Decreased activity tolerance;Decreased mobility;Decreased balance;Decreased knowledge of use of DME;Decreased cognition  PT Treatment Interventions DME instruction;Gait training;Functional mobility training;Patient/family education;Therapeutic activities;Therapeutic exercise;Stair training   PT Goals (Current goals can be found in the Care Plan section) Acute Rehab PT Goals Patient Stated Goal: return home PT Goal Formulation: With patient/family Time For Goal Achievement: 08/16/15 Potential to Achieve Goals: Good    Frequency Min 3X/week   Barriers to discharge        Co-evaluation               End of Session Equipment Utilized During Treatment: Gait belt Activity Tolerance: Patient tolerated treatment well Patient left: in bed;with call bell/phone within reach;with bed alarm set;with family/visitor present           Time: [id number] PT Time Calculation (min) (ACUTE ONLY): 16 min   Charges:   PT Evaluation $PT Eval Low Complexity: 1 Procedure     PT G Codes:        [name],[name] 08/09/2015, 12:51 PM [name], PT, DPT 08/09/2015 Pager: [phone]

## 2015-08-09 NOTE — Progress Notes (Signed)
[facility] PROGRESS NOTE  [name] [id number] DOB: Nov 25, 1927 DOA: 08/07/2015  PCP: [name], MD  Brief HPI: 82 year old Caucasian female who lives with her husband with a known history of breast cancer, Parkinson's disease was entered with 1-2 day history of worsening confusion. She was found to have an abnormal UA. There was history of frequent urination. Her confusion was thought to be secondary to urinary tract infection. She was hospitalized for further management.  Past medical history:  Past Medical History  Diagnosis Date  . Asthma   . Hearing loss   . Diabetes mellitus without complication ([place])     [id number].Marland KitchenMarland Kitchenpt denies  . GERD (gastroesophageal reflux disease)   . NHL (non-Hodgkin's lymphoma) ([employer])     nhl dx 9/04 breast ca dx1/12  . Depression   . Anxiety   . Parkinson disease ([place])     Consultants: None  Procedures: None  Antibiotics: Ceftriaxone  Subjective: Patient remains pleasantly confused. Denies any pain. No nausea, vomiting.   Objective: Vital Signs  Filed Vitals:   08/08/15 1938 08/08/15 2056 08/08/15 2307 08/09/15 0458  BP:  192/79 158/84 166/74  Pulse: 88 81  85  Temp:  98.4 F (36.9 C)  97.5 F (36.4 C)  TempSrc:  Oral  Oral  Resp:  20  16  Height:      Weight:      SpO2:  97%  96%    Intake/Output Summary (Last 24 hours) at 08/09/15 0834 Last data filed at 08/09/15 [id number]  Gross per 24 hour  Intake 2752.5 ml  Output   1950 ml  Net  802.5 ml   Filed Weights   08/07/15 1119 08/07/15 1541  Weight: 48.081 kg (106 lb) 47.718 kg (105 lb 3.2 oz)    General appearance: alert, cooperative, appears stated age, distracted and no distress Resp: clear to auscultation bilaterally Cardio: S1, S2 is normal, regular. Systolic murmur appreciated over the precordium. No S3, S4. No rubs, or bruit. GI: Soft. Nontender, nondistended. Bowel sounds are present. No masses or organomegaly. Extremities: extremities normal,  atraumatic, no cyanosis or edema Neurologic: Alert. Distracted. Disoriented. No cranial nerve deficits. Motor strength is equal bilateral upper and lower extremities.  Lab Results:  Basic Metabolic Panel:  Recent Labs Lab 08/07/15 1141 08/08/15 0412 08/09/15 0440  NA 141 141 140  K 4.0 3.5 3.6  CL 103 103 107  CO2 27 29 24   GLUCOSE 112* 101* 104*  BUN 15 13 9   CREATININE 1.10* 0.96 0.72  CALCIUM 9.4 8.7* 8.3*   CBC:  Recent Labs Lab 08/07/15 1141 08/08/15 0412 08/09/15 0440  WBC 7.7 5.6 5.8  NEUTROABS 6.5  --   --   HGB 11.7* 11.1* 11.3*  HCT 34.4* 32.1* 32.3*  MCV 92.5 92.8 92.0  PLT 125* 121* 139*   CBG:  Recent Labs Lab 08/07/15 1126  GLUCAP 107*    Recent Results (from the past 240 hour(s))  Urine culture     Status: None   Collection Time: 08/07/15  1:35 PM  Result Value Ref Range Status   Specimen Description URINE, CATHETERIZED  Final   Special Requests NONE  Final   Culture   Final    NO GROWTH 1 DAY Performed at [facility]    Report Status 08/08/2015 FINAL  Final      Studies/Results: No results found.  Medications:  Scheduled: . acyclovir  400 mg Oral BID  . anastrozole  1 mg Oral Daily  . carbidopa-levodopa  1 tablet Oral QHS  . carbidopa-levodopa  1 tablet Oral TID  . cefTRIAXone (ROCEPHIN)  IV  1 g Intravenous Q24H  . enoxaparin (LOVENOX) injection  30 mg Subcutaneous Q24H  . entacapone  200 mg Oral TID  . famotidine  20 mg Oral Daily  . mometasone-formoterol  2 puff Inhalation BID  . oxybutynin  10 mg Oral QHS   Continuous: . sodium chloride 50 mL/hr at 08/08/15 2115   [id number], ALPRAZolam, bisacodyl, ondansetron **OR** ondansetron (ZOFRAN) IV, ondansetron  Assessment/Plan:  Principal Problem:   Acute encephalopathy Active Problems:   Malignant neoplasm of female breast (HCC)   Confusion   UTI (lower urinary tract infection)   Acute kidney failure (HCC)    Acute encephalopathy Hasn't had much  improvement in her mental status. Doesn't have any focal neurological deficits. [place] mental status still thought to be secondary to infection. CT head was done which does not show any acute findings. Patient could've had underlying cognitive impairment which has been unmasked. Continue to monitor for now.   UTI Urine cultures did not show any growth. Continue ceftriaxone for today. Consider repeat urine cultures.  Parkinson's disease Continue home medical regimen  Acute kidney injury Prerenal in etiology. Was mild. Resolved with IV fluids.  Thrombocytopenia Platelet counts are stable. Continue to monitor. No bleeding.  History of breast cancer Continue anastrozole  History of non-Hodgkin's lymphoma Currently in clinical remission.  DVT Prophylaxis: Lovenox    Code Status: Full code  Family Communication: Discussed with the patient's husband  Disposition Plan: Continue current treatment as outlined. PT and OT evaluation.    LOS: 1 day   [facility] Pager [phone] 08/09/2015, 8:34 AM  If 7PM-7AM, please contact night-coverage at [email or web address], password [place]

## 2015-08-10 MED ORDER — DOCUSATE SODIUM 100 MG PO CAPS
100.0000 mg | ORAL_CAPSULE | Freq: Two times a day (BID) | ORAL | Status: DC
  Administered 2015-08-10 – 2015-08-11 (×3): 100 mg via ORAL
  Filled 2015-08-10 (×3): qty 1

## 2015-08-10 MED ORDER — HYDRALAZINE HCL 20 MG/ML IJ SOLN
10.0000 mg | Freq: Four times a day (QID) | INTRAMUSCULAR | Status: DC | PRN
  Administered 2015-08-10: 10 mg via INTRAVENOUS
  Filled 2015-08-10: qty 1

## 2015-08-10 NOTE — Evaluation (Signed)
Occupational Therapy Evaluation Patient Details Name: Damaris Hattery MRN: [id number] DOB: 23-Apr-1928 Today's Date: 08/10/2015    History of Present Illness 82 year old Caucasian female who lives with her husband with a known history of breast cancer, Parkinson's disease, hearing loss admitted with worsening confusion thought to be secondary to urinary tract infection   Clinical Impression   Patient presenting with decreased ADL and functional mobility independence secondary to above. Patient independent PTA. Patient currently functioning at an overall min to mod assist level for ADLs and functional mobility. Patient will benefit from acute OT to increase overall independence in the areas of ADLs, functional mobility, and overall safety in order to safely discharge home with 24/7 supervision/assistance from her husband.     Follow Up Recommendations  Home health OT;Supervision/Assistance - 24 hour    Equipment Recommendations  None recommended by OT    Recommendations for Other Services  None at this time   Precautions / Restrictions Precautions Precautions: Fall Restrictions Weight Bearing Restrictions: No    Mobility Bed Mobility Overal bed mobility: Needs Assistance Bed Mobility: Supine to Sit     Supine to sit: Min guard;HOB elevated     General bed mobility comments: Min guard for safety and to steady, HOB slightly elevated  Transfers Overall transfer level: Needs assistance Equipment used: 1 person hand held assist Transfers: Sit to/from [facility] Sit to Stand: Min assist Stand pivot transfers: Min assist General transfer comment: Min assist to boost up from EOB, min assist to perform SPT EOB to recliner. Pt with posterior lean in standing, cues to come forward and maintain balance.    Balance Overall balance assessment: Needs assistance Sitting-balance support: No upper extremity supported;Feet supported Sitting balance-Leahy Scale: Fair    Postural control: Posterior lean Standing balance support: Single extremity supported;During functional activity Standing balance-Leahy Scale: Fair Standing balance comment: posterior lean in standing especially    ADL Overall ADL's : Needs assistance/impaired Eating/Feeding: Set up;Bed level   Grooming: Set up;Sitting   Upper Body Bathing: Sitting;Minimal assitance   Lower Body Bathing: Moderate assistance;Sit to/from stand   Upper Body Dressing : Minimal assistance;Sitting   Lower Body Dressing: Moderate assistance;Sit to/from stand   Toilet Transfer: Minimal Scientist, forensic Details (indicate cue type and reason): EOB to recliner transfer, simulated like toilet/BSC transfer   Toileting - Clothing Manipulation Details (indicate cue type and reason): did not occur   Tub/Shower Transfer Details (indicate cue type and reason): did not occur Functional mobility during ADLs: Minimal assistance;Cueing for safety;Cueing for sequencing General ADL Comments: Pt with minimal complaints of dizziness/lighheadedness. Once pt seated in recliner, elevated BLEs and laid her in more of a reclined position; BP=116/55.     Pertinent Vitals/Pain Pain Assessment: No/denies pain     Hand Dominance Right   Extremity/Trunk Assessment Upper Extremity Assessment Upper Extremity Assessment: Generalized weakness   Lower Extremity Assessment Lower Extremity Assessment: Generalized weakness  Communication Communication Communication: HOH   Cognition Arousal/Alertness: Awake/alert Behavior During Therapy: WFL for tasks assessed/performed Overall Cognitive Status: Impaired/Different from baseline Area of Impairment: Following commands Following Commands: Follows one step commands consistently;Follows one step commands with increased time General Comments: Spouse reports pt is improving              Home Living Family/patient expects to be discharged to:: Private  residence Living Arrangements: Spouse/significant other Available Help at Discharge: Family;Available 24 hours/day Type of Home: House Home Access: Stairs to enter [employer] of Steps: 4-5 Entrance Stairs-Rails: Right;Left;Can reach  both Home Layout: One level     Bathroom Shower/Tub: Walk-in shower;Door   [employer] Toilet: Handicapped height     Home Equipment: Museum/gallery conservator - 4 wheels;Bedside commode   Prior Functioning/Environment Level of Independence: Needs assistance  Gait / Transfers Assistance Needed: spouse reports pt ambulatory at home without assistive device, likely supervision level    OT Diagnosis: Generalized weakness   OT Problem List: Decreased strength;Decreased activity tolerance;Impaired balance (sitting and/or standing);Decreased coordination;Decreased safety awareness;Decreased knowledge of use of DME or AE   OT Treatment/Interventions: Self-care/ADL training;Therapeutic exercise;Energy conservation;DME and/or AE instruction;Therapeutic activities;Patient/family education;Balance training    OT Goals(Current goals can be found in the care plan section) Acute Rehab OT Goals Patient Stated Goal: return home OT Goal Formulation: With patient/family Time For Goal Achievement: 08/24/15 Potential to Achieve Goals: Good ADL Goals Pt Will Perform Grooming: with supervision;standing Pt Will Perform Lower Body Bathing: with supervision;sit to/from stand Pt Will Perform Lower Body Dressing: with supervision;sit to/from stand Pt Will Perform Tub/Shower Transfer: Shower transfer;3 in 1;with supervision;ambulating Pt/caregiver will Perform Home Exercise Program: Increased strength;Both right and left upper extremity;With Supervision;With written HEP provided;With theraband Additional ADL Goal #1: Pt will be supervision with functional mobility using LRAD prn  OT Frequency: Min 2X/week   Barriers to D/C: None known at this time   End of  Session Equipment Utilized During Treatment: Gait belt Nurse Communication: Mobility status  Activity Tolerance: Patient tolerated treatment well Patient left: in chair;with call bell/phone within reach;with chair alarm set;with family/visitor present   Time: [id number] OT Time Calculation (min): 29 min Charges:  OT General Charges $OT Visit: 1 Procedure OT Evaluation $OT Eval Moderate Complexity: 1 Procedure OT Treatments $Self Care/Home Management : 8-22 mins  Chrys Racer , MS, OTR/L, CLT Pager: [phone]  08/10/2015, 9:52 AM

## 2015-08-10 NOTE — Progress Notes (Signed)
[facility] PROGRESS NOTE  [name] [id number] DOB: 1928-05-10 DOA: 08/07/2015  PCP: [name], MD  Brief HPI: 82 year old Caucasian female who lives with her husband with a known history of breast cancer, Parkinson's disease was entered with 1-2 day history of worsening confusion. She was found to have an abnormal UA. There was history of frequent urination. Her confusion was thought to be secondary to urinary tract infection. She was hospitalized for further management.  Past medical history:  Past Medical History  Diagnosis Date  . Asthma   . Hearing loss   . Diabetes mellitus without complication ([place])     [id number].Marland KitchenMarland Kitchenpt denies  . GERD (gastroesophageal reflux disease)   . Depression   . Anxiety   . Parkinson disease ([place])   . NHL (non-Hodgkin's lymphoma) ([place])     nhl dx 9/04 breast ca dx1/12    Consultants: None  Procedures: None  Antibiotics: Ceftriaxone  Subjective: Patient states that she is feeling better this morning. Denies any nausea, vomiting, abdominal pain. No shortness of breath.   Objective: Vital Signs  Filed Vitals:   08/09/15 2140 08/10/15 0000 08/10/15 0238 08/10/15 0442  BP:  181/71 185/83 146/58  Pulse:    77  Temp:    97.3 F (36.3 C)  TempSrc:    Oral  Resp:    14  Height:      Weight:      SpO2: 98%   97%    Intake/Output Summary (Last 24 hours) at 08/10/15 0809 Last data filed at 08/09/15 2041  Gross per 24 hour  Intake    240 ml  Output   1000 ml  Net   -760 ml   Filed Weights   08/07/15 1119 08/07/15 1541  Weight: 48.081 kg (106 lb) 47.718 kg (105 lb 3.2 oz)    General appearance: alert, cooperative, appears stated age, distracted and no distress Resp: clear to auscultation bilaterally Cardio: S1, S2 is normal, regular. Systolic murmur appreciated over the precordium. No S3, S4. No rubs, or bruit. GI: Soft. Nontender, nondistended. Bowel sounds are present. No masses or  organomegaly. Extremities: extremities normal, atraumatic, no cyanosis or edema Neurologic: Alert. Communicating more than yesterday. Oriented to place, month person. No cranial nerve deficits. Motor strength is equal bilateral upper and lower extremities.  Lab Results:  Basic Metabolic Panel:  Recent Labs Lab 08/07/15 1141 08/08/15 0412 08/09/15 0440  NA 141 141 140  K 4.0 3.5 3.6  CL 103 103 107  CO2 27 29 24   GLUCOSE 112* 101* 104*  BUN 15 13 9   CREATININE 1.10* 0.96 0.72  CALCIUM 9.4 8.7* 8.3*   CBC:  Recent Labs Lab 08/07/15 1141 08/08/15 0412 08/09/15 0440  WBC 7.7 5.6 5.8  NEUTROABS 6.5  --   --   HGB 11.7* 11.1* 11.3*  HCT 34.4* 32.1* 32.3*  MCV 92.5 92.8 92.0  PLT 125* 121* 139*   CBG:  Recent Labs Lab 08/07/15 1126  GLUCAP 107*    Recent Results (from the past 240 hour(s))  Urine culture     Status: None   Collection Time: 08/07/15  1:35 PM  Result Value Ref Range Status   Specimen Description URINE, CATHETERIZED  Final   Special Requests NONE  Final   Culture   Final    NO GROWTH 1 DAY Performed at [facility]    Report Status 08/08/2015 FINAL  Final      Studies/Results: Ct Head Wo Contrast  08/09/2015  CLINICAL  DATA:  1-2 days of worsening confusion. EXAM: CT HEAD WITHOUT CONTRAST TECHNIQUE: Contiguous axial images were obtained from the base of the skull through the vertex without intravenous contrast. COMPARISON:  09/19/2012 FINDINGS: Skull and Sinuses:Right mastoidectomy. Bone anchored hearing aid on the right, unremarkable. No acute or aggressive process. Clear paranasal sinuses. Clear mastoid air cells and mastoid bowl. Visualized orbits: Bilateral cataract resection.  No acute finding Brain: No evidence of acute infarction, hemorrhage, hydrocephalus, or mass lesion/mass effect. No evidence of extra-axial collection. There is a stable pattern of patchy low-density in the bilateral cerebral white matter consistent with chronic small  vessel disease, commonly encountered at this age. Remote lacunar infarct in the left putamen. A low-density below the right putamen is likely dilated perivascular space given location. Normal cerebral volume for age. IMPRESSION: 1. No acute finding or change from 2014. 2. Senescent and postoperative changes described above. Electronically Signed   By: Monte Fantasia M.D.   On: 08/09/2015 10:22    Medications:  Scheduled: . acyclovir  400 mg Oral BID  . anastrozole  1 mg Oral Daily  . carbidopa-levodopa  1 tablet Oral QHS  . carbidopa-levodopa  1 tablet Oral TID  . cefTRIAXone (ROCEPHIN)  IV  1 g Intravenous Q24H  . docusate sodium  100 mg Oral BID  . enoxaparin (LOVENOX) injection  40 mg Subcutaneous Q24H  . entacapone  200 mg Oral TID  . famotidine  20 mg Oral Daily  . mometasone-formoterol  2 puff Inhalation BID  . oxybutynin  10 mg Oral QHS   Continuous: . sodium chloride 50 mL/hr at 08/08/15 2115   [id number], ALPRAZolam, bisacodyl, hydrALAZINE, ondansetron **OR** ondansetron (ZOFRAN) IV, ondansetron  Assessment/Plan:  Principal Problem:   Acute encephalopathy Active Problems:   Malignant neoplasm of female breast (HCC)   Confusion   UTI (lower urinary tract infection)   Acute kidney failure ([place])    Acute encephalopathy Mental status has improved this morning. Discussed with the patient's husband as well who also agrees that she is looking much better today. So, it does appear that this most likely was due to her UTI. Doesn't have any focal neurological deficits. CT head was done which does not show any acute findings. Patient could've had underlying cognitive impairment which has been unmasked. Continue to monitor for now.   UTI Initial Urine cultures did not show any growth. This was repeated. Continue ceftriaxone for today.   Parkinson's disease Continue home medical regimen  Acute kidney injury Prerenal in etiology. Was mild. Resolved with IV  fluids.  Thrombocytopenia Platelet counts are stable. Continue to monitor. No bleeding.  History of breast cancer Continue anastrozole  History of non-Hodgkin's lymphoma Currently in clinical remission.  DVT Prophylaxis: Lovenox    Code Status: Full code  Family Communication: Discussed with the patient's husband  Disposition Plan: Continue current treatment as outlined. PT and OT evaluation. If she continues to improve, anticipate discharge tomorrow.    LOS: 2 days   [facility] Pager [phone] 08/10/2015, 8:09 AM  If 7PM-7AM, please contact night-coverage at [email or web address], password [email or web address]

## 2015-08-11 LAB — BASIC METABOLIC PANEL
Anion gap: 7 (ref 5–15)
BUN: 14 mg/dL (ref 6–20)
CHLORIDE: 111 mmol/L (ref 101–111)
CO2: 26 mmol/L (ref 22–32)
CREATININE: 0.86 mg/dL (ref 0.44–1.00)
Calcium: 7.9 mg/dL — ABNORMAL LOW (ref 8.9–10.3)
GFR calc non Af Amer: 59 mL/min — ABNORMAL LOW (ref >60)
Glucose, Bld: 102 mg/dL — ABNORMAL HIGH (ref 65–99)
POTASSIUM: 3.6 mmol/L (ref 3.5–5.1)
SODIUM: 144 mmol/L (ref 135–145)

## 2015-08-11 LAB — CBC
HCT: 31 % — ABNORMAL LOW (ref 36.0–46.0)
Hemoglobin: 10.6 g/dL — ABNORMAL LOW (ref 12.0–15.0)
MCH: 31.5 pg (ref 26.0–34.0)
MCHC: 34.2 g/dL (ref 30.0–36.0)
MCV: 92 fL (ref 78.0–100.0)
Platelets: 159 10*3/uL (ref 150–400)
RBC: 3.37 MIL/uL — AB (ref 3.87–5.11)
RDW: 12.8 % (ref 11.5–15.5)
WBC: 4.3 10*3/uL (ref 4.0–10.5)

## 2015-08-11 MED ORDER — CEFPODOXIME PROXETIL 200 MG PO TABS
200.0000 mg | ORAL_TABLET | Freq: Two times a day (BID) | ORAL | Status: DC
  Administered 2015-08-11: 200 mg via ORAL
  Filled 2015-08-11 (×2): qty 1

## 2015-08-11 MED ORDER — CEFPODOXIME PROXETIL 200 MG PO TABS: 200.0000 mg | ORAL_TABLET | Freq: Two times a day (BID) | ORAL | Status: DC

## 2015-08-11 MED ORDER — DOCUSATE SODIUM 100 MG PO CAPS: 100.0000 mg | ORAL_CAPSULE | Freq: Two times a day (BID) | ORAL | Status: DC

## 2015-08-11 NOTE — Care Management Note (Signed)
Case Management Note  Patient Details  Name: [name] MRN: [id number] Date of Birth: Mar 31, 1928  Subjective/Objective:                  Confusion, UTI  Action/Plan: CM spoke with patient and her husband at the bedside. Discussed options for [facility] providers. Selected [facility] for PT,OT, RN, HHA and SLP. Mary at [facility] notified of the referral and discharge date of today.   Expected Discharge Date:   (unknown)               Expected Discharge Plan:  [place]  In-House Referral:     Discharge planning Services  CM Consult  Post Acute Care Choice:  Home Health Choice offered to:  Patient, Spouse  DME Arranged:  N/A DME Agency:  NA  HH Arranged:  RN, PT, OT, Nurse's Aide, Speech Therapy HH Agency:  [facility]  Status of Service:  Completed, signed off  Medicare Important Message Given:  Yes Date Medicare IM Given:    Medicare IM give by:    Date Additional Medicare IM Given:    Additional Medicare Important Message give by:     If discussed at [place] of Stay Meetings, dates discussed:    Additional Comments:  Apolonio Schneiders, RN 08/11/2015, 10:42 AM

## 2015-08-11 NOTE — Discharge Summary (Signed)
[facility]  Physician Discharge Summary   Patient ID: [name] MRN: [id number] DOB/AGE: 01-02-1928 82 y.o.  Admit date: 08/07/2015 Discharge date: 08/11/2015  PCP: [name], MD  DISCHARGE DIAGNOSES:  Principal Problem:   Acute encephalopathy Active Problems:   Malignant neoplasm of female breast ([place])   Confusion   UTI (lower urinary tract infection)   Acute kidney failure (HCC)   RECOMMENDATIONS FOR OUTPATIENT FOLLOW UP: 1. Home health has been ordered. 2. Patient placed on a dysphagia 3 diet for now. 3. If mental status doesn't return to baseline within a week. She may need to be seen by her neurologist. 4. Repeat urine culture report is pending   DISCHARGE CONDITION: fair  Diet recommendation: Dysphagia 3 diet  Filed Weights   08/07/15 1119 08/07/15 1541  Weight: 48.081 kg (106 lb) 47.718 kg (105 lb 3.2 oz)    INITIAL HISTORY: 82 year old Caucasian femaleCaucasian female who lives with her husband with a known history of breast cancer, Parkinson's disease was entered with 1-2 day history of worsening confusion. She was found to have an abnormal UA. There was history of frequent urination. Her confusion was thought to be secondary to urinary tract infection. She was hospitalized for further management.  HOSPITAL COURSE:   Acute encephalopathy Altered mental status is most likely thought to be secondary to urinary tract infection. CT head did not show any acute findings. Patient cannot undergo MRI due to cochlear implant. Mental status has been improving slowly over the last 2 days. Husband was reassured. He was told that if her mental status does not get back to baseline in 1 week's time, she may need to be seen by her neurologist. Patient could have underlying cognitive impairment which has been unmasked. She does have a history of Parkinson's disease.  UTI She was initially treated with ceftriaxone. Initial Urine cultures did not show any growth. Repeat  cultures are pending. She was changed over to [place] which she has tolerated. Will be discharged with a prescription for same.   Parkinson's disease Continue home medical regimen  Acute kidney injury Prerenal in etiology. Was mild. Resolved with IV fluids.  Thrombocytopenia Platelet counts are stable. No bleeding.  History of breast cancer Continue anastrozole  History of non-Hodgkin's lymphoma Currently in clinical remission.  Overall improved. Seen by physical therapy and home health has been recommended, which will be ordered. Mental status has improved, though not quite back to baseline. Can be pursued further in the outpatient setting. Okay for discharge today.   PERTINENT LABS:  The results of significant diagnostics from this hospitalization (including imaging, microbiology, ancillary and laboratory) are listed below for reference.    Microbiology: Recent Results (from the past 240 hour(s))  Urine culture     Status: None   Collection Time: 08/07/15  1:35 PM  Result Value Ref Range Status   Specimen Description URINE, CATHETERIZED  Final   Special Requests NONE  Final   Culture   Final    NO GROWTH 1 DAY Performed at [facility]    Report Status 08/08/2015 FINAL  Final  Urine culture     Status: None (Preliminary result)   Collection Time: 08/09/15  3:06 PM  Result Value Ref Range Status   Specimen Description URINE, CLEAN CATCH  Final   Special Requests NONE  Final   Culture   Final    TOO YOUNG TO READ Performed at [facility]    Report Status PENDING  Incomplete  Labs: Basic Metabolic Panel:  Recent Labs Lab 08/07/15 1141 08/08/15 0412 08/09/15 0440 08/11/15 0456  NA 141 141 140 144  K 4.0 3.5 3.6 3.6  CL 103 103 107 111  CO2 27 29 24 26   GLUCOSE 112* 101* 104* 102*  BUN 15 13 9 14   CREATININE 1.10* 0.96 0.72 0.86  CALCIUM 9.4 8.7* 8.3* 7.9*   CBC:  Recent Labs Lab 08/07/15 1141 08/08/15 0412 08/09/15 0440  08/11/15 0456  WBC 7.7 5.6 5.8 4.3  NEUTROABS 6.5  --   --   --   HGB 11.7* 11.1* 11.3* 10.6*  HCT 34.4* 32.1* 32.3* 31.0*  MCV 92.5 92.8 92.0 92.0  PLT 125* 121* 139* 159     CBG:  Recent Labs Lab 08/07/15 1126  GLUCAP 107*     IMAGING STUDIES Ct Head Wo Contrast  08/09/2015  CLINICAL DATA:  1-2 days of worsening confusion. EXAM: CT HEAD WITHOUT CONTRAST TECHNIQUE: Contiguous axial images were obtained from the base of the skull through the vertex without intravenous contrast. COMPARISON:  09/19/2012 FINDINGS: Skull and Sinuses:Right mastoidectomy. Bone anchored hearing aid on the right, unremarkable. No acute or aggressive process. Clear paranasal sinuses. Clear mastoid air cells and mastoid bowl. Visualized orbits: Bilateral cataract resection.  No acute finding Brain: No evidence of acute infarction, hemorrhage, hydrocephalus, or mass lesion/mass effect. No evidence of extra-axial collection. There is a stable pattern of patchy low-density in the bilateral cerebral white matter consistent with chronic small vessel disease, commonly encountered at this age. Remote lacunar infarct in the left putamen. A low-density below the right putamen is likely dilated perivascular space given location. Normal cerebral volume for age. IMPRESSION: 1. No acute finding or change from 2014. 2. Senescent and postoperative changes described above. Electronically Signed   By: Monte Fantasia M.D.   On: 08/09/2015 10:22    DISCHARGE EXAMINATION: Filed Vitals:   08/10/15 1957 08/10/15 2116 08/11/15 0553 08/11/15 0814  BP:  120/80 176/67   Pulse:  89 73   Temp:  98.2 F (36.8 C) 97.6 F (36.4 C)   TempSrc:  Oral Oral   Resp:  20 16   Height:      Weight:      SpO2: 96% 97% 99% 98%   General appearance: alert, cooperative, distracted and no distress Resp: clear to auscultation bilaterally Cardio: regular rate and rhythm, S1, S2 normal, no murmur, click, rub or gallop GI: soft, non-tender; bowel  sounds normal; no masses,  no organomegaly Extremities: extremities normal, atraumatic, no cyanosis or edema Alert. Oriented to person, place, year, month. She was just slow to respond. Mental status is better than what it was a few days ago. No facial asymmetry. Motor strength is equal bilateral upper and lower extremities.  DISPOSITION: Home with husband. Home health ordered.  Discharge Instructions    Call MD for:  difficulty breathing, headache or visual disturbances    Complete by:  As directed      Call MD for:  extreme fatigue    Complete by:  As directed      Call MD for:  persistant dizziness or light-headedness    Complete by:  As directed      Call MD for:  persistant nausea and vomiting    Complete by:  As directed      Call MD for:  severe uncontrolled pain    Complete by:  As directed      Call MD for:  temperature >100.4  Complete by:  As directed      Discharge instructions    Complete by:  As directed   Please be sure to follow-up with your primary care physician by the end of this week. Mental status should gradually returned to baseline. If it doesn't, he may have to see her your neurologist. Taking medications as prescribed. Please note diet recommendations: Dysphagia 3 Diet. See information sheet. Home health has been ordered.  You were cared for by a hospitalist during your hospital stay. If you have any questions about your discharge medications or the care you received while you were in the hospital after you are discharged, you can call the unit and asked to speak with the hospitalist on call if the hospitalist that took care of you is not available. Once you are discharged, your primary care physician will handle any further medical issues. Please note that NO REFILLS for any discharge medications will be authorized once you are discharged, as it is imperative that you return to your primary care physician (or establish a relationship with a primary care physician if  you do not have one) for your aftercare needs so that they can reassess your need for medications and monitor your lab values. If you do not have a primary care physician, you can call [phone] for a physician referral.     Increase activity slowly    Complete by:  As directed            ALLERGIES: No Known Allergies    Current Discharge Medication List    START taking these medications   Details  cefpodoxime (VANTIN) 200 MG tablet Take 1 tablet (200 mg total) by mouth every 12 (twelve) hours. For 4 more days starting 08/11/15 at 8pm Qty: 8 tablet, Refills: 0    docusate sodium (COLACE) 100 MG capsule Take 1 capsule (100 mg total) by mouth 2 (two) times daily. Qty: 30 capsule, Refills: 0      CONTINUE these medications which have NOT CHANGED   Details  acetaminophen (TYLENOL) 325 MG tablet Take 650 mg by mouth every 6 (six) hours as needed for moderate pain.     acyclovir (ZOVIRAX) 400 MG tablet TAKE 1 TABLET BY MOUTH TWICE DAILY Qty: 60 tablet, Refills: 11    ALPRAZolam (XANAX) 0.25 MG tablet Take 0.25 mg by mouth 2 (two) times daily as needed for anxiety. Patient taking approx 2 times weekly    anastrozole (ARIMIDEX) 1 MG tablet TAKE ONE (1) TABLET BY MOUTH EVERY DAY Qty: 90 tablet, Refills: 3    bisacodyl (DULCOLAX) 5 MG EC tablet Take 5 mg by mouth daily as needed for moderate constipation.    CALCIUM PO Take 600 mg by mouth 2 (two) times daily.    carbidopa-levodopa (SINEMET CR) 50-200 MG per tablet Take 1 tablet by mouth at bedtime. Qty: 90 tablet, Refills: 3    carbidopa-levodopa (SINEMET IR) 25-100 MG per tablet Take 1 tablet in the morning, 1 tablet in the afternoon, 2 tablets in the evening Qty: 360 tablet, Refills: 3   Associated Diagnoses: Parkinson's disease (HCC)    cetirizine (ZYRTEC) 10 MG tablet Take 10 mg by mouth daily.    cholecalciferol (VITAMIN D) 1000 units tablet Take 1,000 Units by mouth daily.    entacapone (COMTAN) 200 MG tablet TAKE 1  TABLET (200 MG TOTAL) BY MOUTH 3 (THREE) TIMES DAILY. Qty: 270 tablet, Refills: 3    Fluticasone-Salmeterol (ADVAIR DISKUS) 100-50 MCG/DOSE AEPB Inhale 1 puff into the  lungs every 12 (twelve) hours.     Multiple Vitamins-Minerals (MULTIVITAMIN WITH MINERALS) tablet Take 1 tablet by mouth daily.      ondansetron (ZOFRAN) 8 MG tablet Take by mouth every 8 (eight) hours as needed for nausea or vomiting.    oxybutynin (DITROPAN-XL) 10 MG 24 hr tablet Take 10 mg by mouth at bedtime.   Associated Diagnoses: Lymphoma ([place])    ranitidine (ZANTAC) 300 MG tablet Take 300 mg by mouth at bedtime.     zinc gluconate 50 MG tablet Take 50 mg by mouth daily.      STOP taking these medications     buPROPion (WELLBUTRIN XL) 150 MG 24 hr tablet      buPROPion (WELLBUTRIN XL) 300 MG 24 hr tablet        Follow-up Information    Follow up with [name], MD. Schedule an appointment as soon as possible for a visit in 1 week.   Specialty:  Family Medicine   Why:  post hospitalization follow up   Contact information:   [name] [address] [phone]       Follow up with [facility].   Specialty:  Home Health Services   Why:  home health services   Contact information:   [address] [place]  [address] [phone]       Follow up with TAT, REBECCA, DO.   Specialty:  Neurology   Why:  Please follow-up wit Dr. [name] if mental status does not return to baseline in the next 1 week   Contact information:   [place] [address] [phone]       TOTAL DISCHARGE TIME: 33 minutes  [facility] Pager [phone]  08/11/2015, 2:27 PM

## 2015-08-11 NOTE — Discharge Instructions (Signed)
Dysphagia Diet Level 3, Mechanically Advanced The dysphagia level 3 diet includes foods that are soft, moist, and can be chopped into 1-inch chunks. This diet is helpful for people with mild swallowing difficulties. It reduces the risk of food getting caught in the windpipe, trachea, or lungs. WHAT DO I NEED TO KNOW ABOUT THIS DIET?  You may eat foods that are soft and moist.  If you were on the dysphagia level 1 or level 2 diets, you may eat any of the foods included on those lists.  Avoid foods that are dry, hard, sticky, chewy, coarse, and crunchy. Also avoid large cuts of food.  Take small bites. Each bite should contain 1 inch or less of food.  Thicken liquids if instructed by your health care provider. Follow your health care provider's instructions on how to do this and to what consistency.  See your dietitian or speech language pathologist regularly for help with your dietary changes. WHAT FOODS CAN I EAT? Grains Moist breads without nuts or seeds. Biscuits, muffins, pancakes, and waffles well-moistened with syrup, jelly, margarine, or butter. Smooth cereals with plenty of milk to moisten them. Moist bread stuffing. Moist rice. Vegetables All cooked, soft vegetables. Shredded lettuce. Tender fried potatoes. Fruits All canned and cooked fruits. Soft, peeled fresh fruits, such as peaches, nectarines, kiwis, cantaloupe, honeydew melon, and watermelon without seeds. Soft berries, such as strawberries. Meat and Other Protein Sources Moist ground or finely diced or sliced meats. Solid, tender cuts of meat. Meatloaf. Hamburger with a bun. Sausage patty. Deli thin-sliced lunch meat. Chicken, egg, or tuna salad sandwich. Sloppy joe. Moist fish. Eggs prepared any way. Casseroles with small chunks of meats, ground meats, or tender meats. Dairy Cheese spreads without coarse large chunks. Shredded cheese. Cheese slices. Cottage cheese. Milk at the right texture. Smooth frappes. Yogurt without  nuts or coconut. Ask your health care provider whether you can have frozen desserts (such as malts or milk shakes) and thin liquids. Sweets/Desserts Soft, smooth, moist desserts. Non-chewy, smooth candy. Jam. Jelly. Honey. Preserves. Ask your health care provider whether you can have frozen desserts. Fats and Oils Butter. Oils. Margarine. Mayonnaise. Gravy. Spreads. Other All seasonings and sweeteners. All sauces without large chunks. The items listed above may not be a complete list of recommended foods or beverages. Contact your dietitian for more options. WHAT FOODS ARE NOT RECOMMENDED? Grains Coarse or dry cereals. Dry breads. Toast. Crackers. Tough, crusty breads, such French bread and baguettes. Tough, crisp fried potatoes. Potato skins. Dry bread stuffing. Granola. Popcorn. Chips. Vegetables All raw vegetables except shredded lettuce. Cooked corn. Rubbery or stiff cooked vegetables. Stringy vegetables, such as celery. Fruits Hard fruits that are difficult to chew, such as apples or pears. Stringy, high-pulp fruits, such as pineapple, papaya, or mango. Fruits with tough skins, such as grapes. Coconut. All dried fruits. Fruit leather. Fruit roll-ups. Fruit snacks. Meat and Other Protein Sources Dry or tough meats or poultry. Dry fish. Fish with bones. Peanut butter. All nuts and seeds. Dairy  Any with nuts, seeds, chocolate chips, dried fruit, coconut, or pineapple. Sweets/Desserts Dry cakes. Chewy or dry cookies. Any with nuts, seeds, dry fruits, coconut, pineapple, or anything dry, sticky, or hard. Chewy caramel. Licorice. Taffy-type candies. Ask your health care provider whether you can have frozen desserts. Fats and Oils Any with chunks, nuts, seeds, or pineapple. Olives. Angie Fava. Other Soups with tough or large chunks of meats, poultry, or vegetables. Corn or clam chowder. The items listed above may not be  a complete list of foods and beverages to avoid. Contact your dietitian for  more information.   This information is not intended to replace advice given to you by your health care provider. Make sure you discuss any questions you have with your health care provider.   Document Released: 07/13/2005 Document Revised: 08/03/2014 Document Reviewed: 06/26/2013 Elsevier Interactive Patient Education 2016 Elsevier Inc.   Urinary Tract Infection Urinary tract infections (UTIs) can develop anywhere along your urinary tract. Your urinary tract is your body's drainage system for removing wastes and extra water. Your urinary tract includes two kidneys, two ureters, a bladder, and a urethra. Your kidneys are a pair of bean-shaped organs. Each kidney is about the size of your fist. They are located below your ribs, one on each side of your spine. CAUSES Infections are caused by microbes, which are microscopic organisms, including fungi, viruses, and bacteria. These organisms are so small that they can only be seen through a microscope. Bacteria are the microbes that most commonly cause UTIs. SYMPTOMS  Symptoms of UTIs may vary by age and gender of the patient and by the location of the infection. Symptoms in young women typically include a frequent and intense urge to urinate and a painful, burning feeling in the bladder or urethra during urination. Older women and men are more likely to be tired, shaky, and weak and have muscle aches and abdominal pain. A fever may mean the infection is in your kidneys. Other symptoms of a kidney infection include pain in your back or sides below the ribs, nausea, and vomiting. DIAGNOSIS To diagnose a UTI, your caregiver will ask you about your symptoms. Your caregiver will also ask you to provide a urine sample. The urine sample will be tested for bacteria and white blood cells. White blood cells are made by your body to help fight infection. TREATMENT  Typically, UTIs can be treated with medication. Because most UTIs are caused by a bacterial  infection, they usually can be treated with the use of antibiotics. The choice of antibiotic and length of treatment depend on your symptoms and the type of bacteria causing your infection. HOME CARE INSTRUCTIONS  If you were prescribed antibiotics, take them exactly as your caregiver instructs you. Finish the medication even if you feel better after you have only taken some of the medication.  Drink enough water and fluids to keep your urine clear or pale yellow.  Avoid caffeine, tea, and carbonated beverages. They tend to irritate your bladder.  Empty your bladder often. Avoid holding urine for long periods of time.  Empty your bladder before and after sexual intercourse.  After a bowel movement, women should cleanse from front to back. Use each tissue only once. SEEK MEDICAL CARE IF:   You have back pain.  You develop a fever.  Your symptoms do not begin to resolve within 3 days. SEEK IMMEDIATE MEDICAL CARE IF:   You have severe back pain or lower abdominal pain.  You develop chills.  You have nausea or vomiting.  You have continued burning or discomfort with urination. MAKE SURE YOU:   Understand these instructions.  Will watch your condition.  Will get help right away if you are not doing well or get worse.   This information is not intended to replace advice given to you by your health care provider. Make sure you discuss any questions you have with your health care provider.   Document Released: 04/22/2005 Document Revised: 04/03/2015 Document Reviewed: 08/21/2011 Elsevier  Interactive Patient Education ©2016 Elsevier Inc. ° °

## 2015-08-12 LAB — URINE CULTURE

## 2015-08-13 DIAGNOSIS — Z9181 History of falling: Secondary | ICD-10-CM | POA: Diagnosis not present

## 2015-08-13 DIAGNOSIS — M6281 Muscle weakness (generalized): Secondary | ICD-10-CM | POA: Diagnosis not present

## 2015-08-13 DIAGNOSIS — H9190 Unspecified hearing loss, unspecified ear: Secondary | ICD-10-CM | POA: Diagnosis not present

## 2015-08-13 DIAGNOSIS — F419 Anxiety disorder, unspecified: Secondary | ICD-10-CM | POA: Diagnosis not present

## 2015-08-13 DIAGNOSIS — J45909 Unspecified asthma, uncomplicated: Secondary | ICD-10-CM | POA: Diagnosis not present

## 2015-08-13 DIAGNOSIS — Z87440 Personal history of urinary (tract) infections: Secondary | ICD-10-CM | POA: Diagnosis not present

## 2015-08-13 DIAGNOSIS — Z853 Personal history of malignant neoplasm of breast: Secondary | ICD-10-CM | POA: Diagnosis not present

## 2015-08-13 DIAGNOSIS — C8590 Non-Hodgkin lymphoma, unspecified, unspecified site: Secondary | ICD-10-CM | POA: Diagnosis not present

## 2015-08-13 DIAGNOSIS — E119 Type 2 diabetes mellitus without complications: Secondary | ICD-10-CM | POA: Diagnosis not present

## 2015-08-13 DIAGNOSIS — K219 Gastro-esophageal reflux disease without esophagitis: Secondary | ICD-10-CM | POA: Diagnosis not present

## 2015-08-13 DIAGNOSIS — F329 Major depressive disorder, single episode, unspecified: Secondary | ICD-10-CM | POA: Diagnosis not present

## 2015-08-13 DIAGNOSIS — G20 Parkinson's disease: Secondary | ICD-10-CM | POA: Diagnosis not present

## 2015-08-13 DIAGNOSIS — F0280 Dementia in other diseases classified elsewhere without behavioral disturbance: Secondary | ICD-10-CM | POA: Diagnosis not present

## 2015-08-14 DIAGNOSIS — E119 Type 2 diabetes mellitus without complications: Secondary | ICD-10-CM | POA: Diagnosis not present

## 2015-08-14 DIAGNOSIS — M6281 Muscle weakness (generalized): Secondary | ICD-10-CM | POA: Diagnosis not present

## 2015-08-14 DIAGNOSIS — C8590 Non-Hodgkin lymphoma, unspecified, unspecified site: Secondary | ICD-10-CM | POA: Diagnosis not present

## 2015-08-14 DIAGNOSIS — F329 Major depressive disorder, single episode, unspecified: Secondary | ICD-10-CM | POA: Diagnosis not present

## 2015-08-14 DIAGNOSIS — F0280 Dementia in other diseases classified elsewhere without behavioral disturbance: Secondary | ICD-10-CM | POA: Diagnosis not present

## 2015-08-14 DIAGNOSIS — G20 Parkinson's disease: Secondary | ICD-10-CM | POA: Diagnosis not present

## 2015-08-15 ENCOUNTER — Telehealth: Payer: Self-pay | Admitting: *Deleted

## 2015-08-15 DIAGNOSIS — E119 Type 2 diabetes mellitus without complications: Secondary | ICD-10-CM | POA: Diagnosis not present

## 2015-08-15 DIAGNOSIS — G20 Parkinson's disease: Secondary | ICD-10-CM | POA: Diagnosis not present

## 2015-08-15 DIAGNOSIS — F329 Major depressive disorder, single episode, unspecified: Secondary | ICD-10-CM | POA: Diagnosis not present

## 2015-08-15 DIAGNOSIS — M6281 Muscle weakness (generalized): Secondary | ICD-10-CM | POA: Diagnosis not present

## 2015-08-15 DIAGNOSIS — F0280 Dementia in other diseases classified elsewhere without behavioral disturbance: Secondary | ICD-10-CM | POA: Diagnosis not present

## 2015-08-15 DIAGNOSIS — C8590 Non-Hodgkin lymphoma, unspecified, unspecified site: Secondary | ICD-10-CM | POA: Diagnosis not present

## 2015-08-15 NOTE — Telephone Encounter (Signed)
Call received in [place] from April with [facility] requesting extended orders for home PT post discharge. PT evaluation was ordered by hospitalist.  Pt was d/ced post acute encephalopathy with UTI.  Per chart review noted Dr [name] as primary MD.  Pt seen at this office for hx of breast cancer and NHL in remission.   This RN discussed with April best to contact primary MD who per charted documentation is following pt for above diagnosis.

## 2015-08-16 DIAGNOSIS — G20 Parkinson's disease: Secondary | ICD-10-CM | POA: Diagnosis not present

## 2015-08-16 DIAGNOSIS — F0280 Dementia in other diseases classified elsewhere without behavioral disturbance: Secondary | ICD-10-CM | POA: Diagnosis not present

## 2015-08-16 DIAGNOSIS — E119 Type 2 diabetes mellitus without complications: Secondary | ICD-10-CM | POA: Diagnosis not present

## 2015-08-16 DIAGNOSIS — F329 Major depressive disorder, single episode, unspecified: Secondary | ICD-10-CM | POA: Diagnosis not present

## 2015-08-16 DIAGNOSIS — M6281 Muscle weakness (generalized): Secondary | ICD-10-CM | POA: Diagnosis not present

## 2015-08-16 DIAGNOSIS — C8590 Non-Hodgkin lymphoma, unspecified, unspecified site: Secondary | ICD-10-CM | POA: Diagnosis not present

## 2015-08-19 DIAGNOSIS — C8590 Non-Hodgkin lymphoma, unspecified, unspecified site: Secondary | ICD-10-CM | POA: Diagnosis not present

## 2015-08-19 DIAGNOSIS — E119 Type 2 diabetes mellitus without complications: Secondary | ICD-10-CM | POA: Diagnosis not present

## 2015-08-19 DIAGNOSIS — F329 Major depressive disorder, single episode, unspecified: Secondary | ICD-10-CM | POA: Diagnosis not present

## 2015-08-19 DIAGNOSIS — G20 Parkinson's disease: Secondary | ICD-10-CM | POA: Diagnosis not present

## 2015-08-19 DIAGNOSIS — F0280 Dementia in other diseases classified elsewhere without behavioral disturbance: Secondary | ICD-10-CM | POA: Diagnosis not present

## 2015-08-19 DIAGNOSIS — M6281 Muscle weakness (generalized): Secondary | ICD-10-CM | POA: Diagnosis not present

## 2015-08-20 ENCOUNTER — Ambulatory Visit: Payer: Medicare Other

## 2015-08-20 ENCOUNTER — Ambulatory Visit: Payer: Medicare Other | Admitting: Physical Therapy

## 2015-08-20 ENCOUNTER — Ambulatory Visit: Payer: Medicare Other | Admitting: Occupational Therapy

## 2015-08-20 DIAGNOSIS — G20 Parkinson's disease: Secondary | ICD-10-CM | POA: Diagnosis not present

## 2015-08-20 DIAGNOSIS — E119 Type 2 diabetes mellitus without complications: Secondary | ICD-10-CM | POA: Diagnosis not present

## 2015-08-20 DIAGNOSIS — F329 Major depressive disorder, single episode, unspecified: Secondary | ICD-10-CM | POA: Diagnosis not present

## 2015-08-20 DIAGNOSIS — F0280 Dementia in other diseases classified elsewhere without behavioral disturbance: Secondary | ICD-10-CM | POA: Diagnosis not present

## 2015-08-20 DIAGNOSIS — M6281 Muscle weakness (generalized): Secondary | ICD-10-CM | POA: Diagnosis not present

## 2015-08-20 DIAGNOSIS — C8590 Non-Hodgkin lymphoma, unspecified, unspecified site: Secondary | ICD-10-CM | POA: Diagnosis not present

## 2015-08-21 DIAGNOSIS — F329 Major depressive disorder, single episode, unspecified: Secondary | ICD-10-CM | POA: Diagnosis not present

## 2015-08-21 DIAGNOSIS — E119 Type 2 diabetes mellitus without complications: Secondary | ICD-10-CM | POA: Diagnosis not present

## 2015-08-21 DIAGNOSIS — F0280 Dementia in other diseases classified elsewhere without behavioral disturbance: Secondary | ICD-10-CM | POA: Diagnosis not present

## 2015-08-21 DIAGNOSIS — G20 Parkinson's disease: Secondary | ICD-10-CM | POA: Diagnosis not present

## 2015-08-21 DIAGNOSIS — M6281 Muscle weakness (generalized): Secondary | ICD-10-CM | POA: Diagnosis not present

## 2015-08-21 DIAGNOSIS — C8590 Non-Hodgkin lymphoma, unspecified, unspecified site: Secondary | ICD-10-CM | POA: Diagnosis not present

## 2015-08-22 DIAGNOSIS — N390 Urinary tract infection, site not specified: Secondary | ICD-10-CM | POA: Diagnosis not present

## 2015-08-22 DIAGNOSIS — F419 Anxiety disorder, unspecified: Secondary | ICD-10-CM | POA: Diagnosis not present

## 2015-08-23 DIAGNOSIS — F0280 Dementia in other diseases classified elsewhere without behavioral disturbance: Secondary | ICD-10-CM | POA: Diagnosis not present

## 2015-08-23 DIAGNOSIS — E119 Type 2 diabetes mellitus without complications: Secondary | ICD-10-CM | POA: Diagnosis not present

## 2015-08-23 DIAGNOSIS — G20 Parkinson's disease: Secondary | ICD-10-CM | POA: Diagnosis not present

## 2015-08-23 DIAGNOSIS — F329 Major depressive disorder, single episode, unspecified: Secondary | ICD-10-CM | POA: Diagnosis not present

## 2015-08-23 DIAGNOSIS — M6281 Muscle weakness (generalized): Secondary | ICD-10-CM | POA: Diagnosis not present

## 2015-08-23 DIAGNOSIS — C8590 Non-Hodgkin lymphoma, unspecified, unspecified site: Secondary | ICD-10-CM | POA: Diagnosis not present

## 2015-08-26 DIAGNOSIS — F0280 Dementia in other diseases classified elsewhere without behavioral disturbance: Secondary | ICD-10-CM | POA: Diagnosis not present

## 2015-08-26 DIAGNOSIS — M6281 Muscle weakness (generalized): Secondary | ICD-10-CM | POA: Diagnosis not present

## 2015-08-26 DIAGNOSIS — G20 Parkinson's disease: Secondary | ICD-10-CM | POA: Diagnosis not present

## 2015-08-26 DIAGNOSIS — F329 Major depressive disorder, single episode, unspecified: Secondary | ICD-10-CM | POA: Diagnosis not present

## 2015-08-26 DIAGNOSIS — E119 Type 2 diabetes mellitus without complications: Secondary | ICD-10-CM | POA: Diagnosis not present

## 2015-08-26 DIAGNOSIS — C8590 Non-Hodgkin lymphoma, unspecified, unspecified site: Secondary | ICD-10-CM | POA: Diagnosis not present

## 2015-08-27 ENCOUNTER — Telehealth: Payer: Self-pay | Admitting: *Deleted

## 2015-08-27 NOTE — Telephone Encounter (Signed)
Received [facility] certification in the mail. Called [facility], requested they send orders to PCP. They asked that I return orders by mail with note attached. Same done.

## 2015-08-28 DIAGNOSIS — E119 Type 2 diabetes mellitus without complications: Secondary | ICD-10-CM | POA: Diagnosis not present

## 2015-08-28 DIAGNOSIS — C8590 Non-Hodgkin lymphoma, unspecified, unspecified site: Secondary | ICD-10-CM | POA: Diagnosis not present

## 2015-08-28 DIAGNOSIS — G20 Parkinson's disease: Secondary | ICD-10-CM | POA: Diagnosis not present

## 2015-08-28 DIAGNOSIS — F329 Major depressive disorder, single episode, unspecified: Secondary | ICD-10-CM | POA: Diagnosis not present

## 2015-08-28 DIAGNOSIS — F0280 Dementia in other diseases classified elsewhere without behavioral disturbance: Secondary | ICD-10-CM | POA: Diagnosis not present

## 2015-08-28 DIAGNOSIS — M6281 Muscle weakness (generalized): Secondary | ICD-10-CM | POA: Diagnosis not present

## 2015-08-29 DIAGNOSIS — G20 Parkinson's disease: Secondary | ICD-10-CM | POA: Diagnosis not present

## 2015-08-29 DIAGNOSIS — F0280 Dementia in other diseases classified elsewhere without behavioral disturbance: Secondary | ICD-10-CM | POA: Diagnosis not present

## 2015-08-29 DIAGNOSIS — C8590 Non-Hodgkin lymphoma, unspecified, unspecified site: Secondary | ICD-10-CM | POA: Diagnosis not present

## 2015-08-29 DIAGNOSIS — E119 Type 2 diabetes mellitus without complications: Secondary | ICD-10-CM | POA: Diagnosis not present

## 2015-08-29 DIAGNOSIS — M6281 Muscle weakness (generalized): Secondary | ICD-10-CM | POA: Diagnosis not present

## 2015-08-29 DIAGNOSIS — F329 Major depressive disorder, single episode, unspecified: Secondary | ICD-10-CM | POA: Diagnosis not present

## 2015-08-30 DIAGNOSIS — E119 Type 2 diabetes mellitus without complications: Secondary | ICD-10-CM | POA: Diagnosis not present

## 2015-08-30 DIAGNOSIS — M6281 Muscle weakness (generalized): Secondary | ICD-10-CM | POA: Diagnosis not present

## 2015-08-30 DIAGNOSIS — G20 Parkinson's disease: Secondary | ICD-10-CM | POA: Diagnosis not present

## 2015-08-30 DIAGNOSIS — C8590 Non-Hodgkin lymphoma, unspecified, unspecified site: Secondary | ICD-10-CM | POA: Diagnosis not present

## 2015-08-30 DIAGNOSIS — F329 Major depressive disorder, single episode, unspecified: Secondary | ICD-10-CM | POA: Diagnosis not present

## 2015-08-30 DIAGNOSIS — F0280 Dementia in other diseases classified elsewhere without behavioral disturbance: Secondary | ICD-10-CM | POA: Diagnosis not present

## 2015-09-02 ENCOUNTER — Ambulatory Visit (INDEPENDENT_AMBULATORY_CARE_PROVIDER_SITE_OTHER): Payer: Medicare Other | Admitting: Cardiology

## 2015-09-02 ENCOUNTER — Encounter: Payer: Self-pay | Admitting: Cardiology

## 2015-09-02 VITALS — BP 116/60 | HR 83 | Ht 61.0 in | Wt 111.1 lb

## 2015-09-02 DIAGNOSIS — F329 Major depressive disorder, single episode, unspecified: Secondary | ICD-10-CM | POA: Diagnosis not present

## 2015-09-02 DIAGNOSIS — R0989 Other specified symptoms and signs involving the circulatory and respiratory systems: Secondary | ICD-10-CM | POA: Insufficient documentation

## 2015-09-02 DIAGNOSIS — F0280 Dementia in other diseases classified elsewhere without behavioral disturbance: Secondary | ICD-10-CM | POA: Diagnosis not present

## 2015-09-02 DIAGNOSIS — G20 Parkinson's disease: Secondary | ICD-10-CM | POA: Diagnosis not present

## 2015-09-02 DIAGNOSIS — M6281 Muscle weakness (generalized): Secondary | ICD-10-CM | POA: Diagnosis not present

## 2015-09-02 DIAGNOSIS — E119 Type 2 diabetes mellitus without complications: Secondary | ICD-10-CM | POA: Diagnosis not present

## 2015-09-02 DIAGNOSIS — C8590 Non-Hodgkin lymphoma, unspecified, unspecified site: Secondary | ICD-10-CM | POA: Diagnosis not present

## 2015-09-02 DIAGNOSIS — R011 Cardiac murmur, unspecified: Secondary | ICD-10-CM | POA: Diagnosis not present

## 2015-09-02 NOTE — Progress Notes (Addendum)
Cardiology Office Note    Date:  09/13/2015   ID:  [name], DOB [birth year], MRN [id number]  PCP:  [name], MD  Cardiologist:   [name], MD     History of Present Illness:  [name] is a 82 y.o. female here for evaluation of heart murmur. She's not having any shortness of breath, no chest pain, no unexplained syncope. No palpitations. She has been seeing Dr. [name] for 4 years for mild Parkinson's.  Her husband also is a patient of mine. Prior bypass surgery.  Had recent admit to [facility] hospital. UTI, confusion. Lovely grandchild.    Past Medical History  Diagnosis Date  . Asthma   . Hearing loss   . Diabetes mellitus without complication ([place])     [id number].Marland KitchenMarland Kitchenpt denies  . GERD (gastroesophageal reflux disease)   . Depression   . Anxiety   . Parkinson disease ([place])   . NHL (non-Hodgkin's lymphoma) ([place])     nhl dx 9/04 breast ca dx1/12    Past Surgical History  Procedure Laterality Date  . Mastectomy  2 /8/ 12    bilateral  . Ba-ha ear implant    . Mastectomy      Outpatient Prescriptions Prior to Visit  Medication Sig Dispense Refill  . acetaminophen (TYLENOL) 325 MG tablet Take 650 mg by mouth every 6 (six) hours as needed for moderate pain.     [name] acyclovir (ZOVIRAX) 400 MG tablet TAKE 1 TABLET BY MOUTH TWICE DAILY 60 tablet 11  . ALPRAZolam (XANAX) 0.25 MG tablet Take 0.25 mg by mouth 2 (two) times daily as needed for anxiety. Patient taking approx 2 times weekly    . anastrozole (ARIMIDEX) 1 MG tablet TAKE ONE (1) TABLET BY MOUTH EVERY DAY 90 tablet 3  . bisacodyl (DULCOLAX) 5 MG EC tablet Take 5 mg by mouth daily as needed for moderate constipation.    [name] CALCIUM PO Take 600 mg by mouth 2 (two) times daily.    . carbidopa-levodopa (SINEMET CR) 50-200 MG per tablet Take 1 tablet by mouth at bedtime. 90 tablet 3  . cetirizine (ZYRTEC) 10 MG tablet Take 10 mg by mouth daily.    . cholecalciferol (VITAMIN D) 1000 units tablet Take 1,000  Units by mouth daily.    [name] docusate sodium (COLACE) 100 MG capsule Take 1 capsule (100 mg total) by mouth 2 (two) times daily. 30 capsule 0  . entacapone (COMTAN) 200 MG tablet TAKE 1 TABLET (200 MG TOTAL) BY MOUTH 3 (THREE) TIMES DAILY. 270 tablet 3  . Fluticasone-Salmeterol (ADVAIR DISKUS) 100-50 MCG/DOSE AEPB Inhale 1 puff into the lungs every 12 (twelve) hours.     . Multiple Vitamins-Minerals (MULTIVITAMIN WITH MINERALS) tablet Take 1 tablet by mouth daily.      . ondansetron (ZOFRAN) 8 MG tablet Take by mouth every 8 (eight) hours as needed for nausea or vomiting.    [name] oxybutynin (DITROPAN-XL) 10 MG 24 hr tablet Take 10 mg by mouth at bedtime.    . ranitidine (ZANTAC) 300 MG tablet Take 300 mg by mouth at bedtime.     . carbidopa-levodopa (SINEMET IR) 25-100 MG per tablet Take 1 tablet in the morning, 1 tablet in the afternoon, 2 tablets in the evening (Patient taking differently: No sig reported) 360 tablet 3  . cefpodoxime (VANTIN) 200 MG tablet Take 1 tablet (200 mg total) by mouth every 12 (twelve) hours. For 4 more days starting 08/11/15 at 8pm 8 tablet 0  .  zinc gluconate 50 MG tablet Take 50 mg by mouth daily.     No facility-administered medications prior to visit.     Allergies:   Review of patient's allergies indicates no known allergies.   Social History   Social History  . Marital Status: Married    Spouse Name: N/A  . Number of Children: N/A  . Years of Education: N/A   Occupational History  . retired     Pharmacist, hospital   Social History Main Topics  . Smoking status: Never Smoker   . Smokeless tobacco: Never Used     Comment: husband wsa smoker  . Alcohol Use: No     Comment: no  . Drug Use: No  . Sexual Activity: Not Asked   Other Topics Concern  . None   Social History Narrative     Family History:  The patient's family history includes Cancer in her father and mother; Lung cancer in her father.   ROS:   Please see the history of present illness.      ROS  no recent fevers, chills, orthopnea, PND, no strokelike symptoms All other systems reviewed and are negative.   PHYSICAL EXAM:   VS:  BP 116/60 mmHg  Pulse 83  Ht 5\' 1"  (1.549 m)  Wt 111 lb 1.9 oz (50.404 kg)  BMI 21.01 kg/m2   GEN: Well nourished, well developed, in no acute distress HEENT: normal Neck: no JVD, positive bilateral carotid bruits, no masses Cardiac: RRR; 2/6 systolic right upper sternal border murmur, no rubs, or gallops,no edema  Respiratory:  clear to auscultation bilaterally, normal work of breathing GI: soft, nontender, nondistended, + BS MS: no deformity or atrophy Skin: warm and dry, no rash Neuro:  Alert and Oriented x 3, Strength and sensation are intact Psych: euthymic mood, full affect  Wt Readings from Last 3 Encounters:  09/11/15 113 lb (51.256 kg)  09/02/15 111 lb 1.9 oz (50.404 kg)  08/07/15 105 lb 3.2 oz (47.718 kg)      Studies/Labs Reviewed:   EKG:  None today  Recent Labs: 02/07/2015: B Natriuretic Peptide 322.7* 06/13/2015: ALT <9 08/11/2015: BUN 14; Creatinine, Ser 0.86; Hemoglobin 10.6*; Platelets 159; Potassium 3.6; Sodium 144   Lipid Panel No results found for: CHOL, TRIG, HDL, CHOLHDL, VLDL, LDLCALC, LDLDIRECT  Additional studies/ records that were reviewed today include:  Prior hospital records, office notes reviewed, lab work reviewed    ASSESSMENT:    1. Murmur   2. Parkinson disease ([place])   3. Bilateral carotid bruits      PLAN:  In order of problems listed above:  1. Heart murmur-we will check echocardiogram for further evaluation of structure and function. May be aortic stenosis. Thankfully, no syncope, no anginal symptoms, no shortness of breath. We will base follow-up on results of echocardiogram. 2. Parkinson's disease-monitored by Dr. [name] 3. Carotid bruit-may be radiation of aortic valve murmur.    Medication Adjustments/Labs and Tests Ordered: Current medicines are reviewed at length with the  patient today.  Concerns regarding medicines are outlined above.  Medication changes, Labs and Tests ordered today are listed in the Patient Instructions below. Patient Instructions  Medication Instructions:  The current medical regimen is effective;  continue present plan and medications.  Testing/Procedures: Your physician has requested that you have an echocardiogram. Echocardiography is a painless test that uses sound waves to create images of your heart. It provides your doctor with information about the size and shape of your heart and how  well your heart's chambers and valves are working. This procedure takes approximately one hour. There are no restrictions for this procedure.  Follow-Up: As needed with Dr Marlou Porch  Thank you for choosing [facility]!!             Signed, [name], MD  09/13/2015 6:53 AM    [facility] [address], [place], [place]  [address] Phone: [phone]; Fax: [phone]

## 2015-09-02 NOTE — Patient Instructions (Signed)
Medication Instructions:  The current medical regimen is effective;  continue present plan and medications.  Testing/Procedures: Your physician has requested that you have an echocardiogram. Echocardiography is a painless test that uses sound waves to create images of your heart. It provides your doctor with information about the size and shape of your heart and how well your heart's chambers and valves are working. This procedure takes approximately one hour. There are no restrictions for this procedure.  Follow-Up: As needed with Dr Marlou Porch  Thank you for choosing [facility]!!

## 2015-09-03 DIAGNOSIS — F329 Major depressive disorder, single episode, unspecified: Secondary | ICD-10-CM | POA: Diagnosis not present

## 2015-09-03 DIAGNOSIS — M6281 Muscle weakness (generalized): Secondary | ICD-10-CM | POA: Diagnosis not present

## 2015-09-03 DIAGNOSIS — C8590 Non-Hodgkin lymphoma, unspecified, unspecified site: Secondary | ICD-10-CM | POA: Diagnosis not present

## 2015-09-03 DIAGNOSIS — G20 Parkinson's disease: Secondary | ICD-10-CM | POA: Diagnosis not present

## 2015-09-03 DIAGNOSIS — F0280 Dementia in other diseases classified elsewhere without behavioral disturbance: Secondary | ICD-10-CM | POA: Diagnosis not present

## 2015-09-03 DIAGNOSIS — E119 Type 2 diabetes mellitus without complications: Secondary | ICD-10-CM | POA: Diagnosis not present

## 2015-09-04 DIAGNOSIS — C8590 Non-Hodgkin lymphoma, unspecified, unspecified site: Secondary | ICD-10-CM | POA: Diagnosis not present

## 2015-09-04 DIAGNOSIS — F0280 Dementia in other diseases classified elsewhere without behavioral disturbance: Secondary | ICD-10-CM | POA: Diagnosis not present

## 2015-09-04 DIAGNOSIS — G20 Parkinson's disease: Secondary | ICD-10-CM | POA: Diagnosis not present

## 2015-09-04 DIAGNOSIS — M6281 Muscle weakness (generalized): Secondary | ICD-10-CM | POA: Diagnosis not present

## 2015-09-04 DIAGNOSIS — F329 Major depressive disorder, single episode, unspecified: Secondary | ICD-10-CM | POA: Diagnosis not present

## 2015-09-04 DIAGNOSIS — E119 Type 2 diabetes mellitus without complications: Secondary | ICD-10-CM | POA: Diagnosis not present

## 2015-09-05 DIAGNOSIS — E119 Type 2 diabetes mellitus without complications: Secondary | ICD-10-CM | POA: Diagnosis not present

## 2015-09-05 DIAGNOSIS — F329 Major depressive disorder, single episode, unspecified: Secondary | ICD-10-CM | POA: Diagnosis not present

## 2015-09-05 DIAGNOSIS — C8590 Non-Hodgkin lymphoma, unspecified, unspecified site: Secondary | ICD-10-CM | POA: Diagnosis not present

## 2015-09-05 DIAGNOSIS — F0280 Dementia in other diseases classified elsewhere without behavioral disturbance: Secondary | ICD-10-CM | POA: Diagnosis not present

## 2015-09-05 DIAGNOSIS — G20 Parkinson's disease: Secondary | ICD-10-CM | POA: Diagnosis not present

## 2015-09-05 DIAGNOSIS — M6281 Muscle weakness (generalized): Secondary | ICD-10-CM | POA: Diagnosis not present

## 2015-09-06 DIAGNOSIS — F329 Major depressive disorder, single episode, unspecified: Secondary | ICD-10-CM | POA: Diagnosis not present

## 2015-09-06 DIAGNOSIS — G20 Parkinson's disease: Secondary | ICD-10-CM | POA: Diagnosis not present

## 2015-09-06 DIAGNOSIS — F0280 Dementia in other diseases classified elsewhere without behavioral disturbance: Secondary | ICD-10-CM | POA: Diagnosis not present

## 2015-09-06 DIAGNOSIS — E119 Type 2 diabetes mellitus without complications: Secondary | ICD-10-CM | POA: Diagnosis not present

## 2015-09-06 DIAGNOSIS — C8590 Non-Hodgkin lymphoma, unspecified, unspecified site: Secondary | ICD-10-CM | POA: Diagnosis not present

## 2015-09-06 DIAGNOSIS — M6281 Muscle weakness (generalized): Secondary | ICD-10-CM | POA: Diagnosis not present

## 2015-09-09 DIAGNOSIS — E119 Type 2 diabetes mellitus without complications: Secondary | ICD-10-CM | POA: Diagnosis not present

## 2015-09-09 DIAGNOSIS — G20 Parkinson's disease: Secondary | ICD-10-CM | POA: Diagnosis not present

## 2015-09-09 DIAGNOSIS — C8590 Non-Hodgkin lymphoma, unspecified, unspecified site: Secondary | ICD-10-CM | POA: Diagnosis not present

## 2015-09-09 DIAGNOSIS — F329 Major depressive disorder, single episode, unspecified: Secondary | ICD-10-CM | POA: Diagnosis not present

## 2015-09-09 DIAGNOSIS — F0280 Dementia in other diseases classified elsewhere without behavioral disturbance: Secondary | ICD-10-CM | POA: Diagnosis not present

## 2015-09-09 DIAGNOSIS — M6281 Muscle weakness (generalized): Secondary | ICD-10-CM | POA: Diagnosis not present

## 2015-09-11 ENCOUNTER — Encounter: Payer: Self-pay | Admitting: Neurology

## 2015-09-11 ENCOUNTER — Ambulatory Visit (INDEPENDENT_AMBULATORY_CARE_PROVIDER_SITE_OTHER): Payer: Medicare Other | Admitting: Neurology

## 2015-09-11 ENCOUNTER — Telehealth: Payer: Self-pay | Admitting: *Deleted

## 2015-09-11 VITALS — BP 142/62 | HR 82 | Ht 63.5 in | Wt 113.0 lb

## 2015-09-11 DIAGNOSIS — F329 Major depressive disorder, single episode, unspecified: Secondary | ICD-10-CM | POA: Diagnosis not present

## 2015-09-11 DIAGNOSIS — G20 Parkinson's disease: Secondary | ICD-10-CM | POA: Diagnosis not present

## 2015-09-11 DIAGNOSIS — F411 Generalized anxiety disorder: Secondary | ICD-10-CM | POA: Diagnosis not present

## 2015-09-11 DIAGNOSIS — C8590 Non-Hodgkin lymphoma, unspecified, unspecified site: Secondary | ICD-10-CM | POA: Diagnosis not present

## 2015-09-11 DIAGNOSIS — G20A1 Parkinson's disease without dyskinesia, without mention of fluctuations: Secondary | ICD-10-CM

## 2015-09-11 DIAGNOSIS — M6281 Muscle weakness (generalized): Secondary | ICD-10-CM | POA: Diagnosis not present

## 2015-09-11 DIAGNOSIS — F0280 Dementia in other diseases classified elsewhere without behavioral disturbance: Secondary | ICD-10-CM | POA: Diagnosis not present

## 2015-09-11 DIAGNOSIS — E119 Type 2 diabetes mellitus without complications: Secondary | ICD-10-CM | POA: Diagnosis not present

## 2015-09-11 NOTE — Progress Notes (Signed)
[name] was seen today in the movement disorders clinic for neurologic consultation at the request of Dr. Betsy Coder.    The consultation is for the evaluation of ataxia and tremor.  The patient has been a previous pt of Dr. Erling Cruz.  Dr. Erling Cruz dx the patient with parkinsonism and ET.  This is an 82 y/o female with a complex medical hx of both non Hodgkins lymphoma require chemotherapy and breast CA, s/p bilateral mastectomy.  She has a long hx of tremor.  I reviewed Dr. Bernardo Heater notes that were made available to me.  It appears that she presented with right hand resting tremor in 2009, without associated rigidity or bradykinesia.  There were no known medications causing the problem and it appears that she was initially dx with ET and later transitioned the dx to parkinsonism.  The patient reports no tremor in the L hand.  Her husband reports a little tremor in the R leg.  The patient reports that tremor is most significant with relaxing and she has no tremor if her hands are preoccupied.    She has never been on PD medication or been to PT until yesterday.  03/16/13 update:  The pt presents today with her husband.  She has not been seen since February, 2014 at which time I dx her with PD.  She was started on levodopa 25/100 three times per day.  She did not follow up sooner as she had bronchitis and pneumonia for the last 2 months and is just now recovering.  She did go to PT and liked it but would like to go back.  She is doing well on the levodopa.  Her husband rarely notice tremor any longer.  06/28/13 update:    The pt today presents for f/u with her husband who supplements the hx.  She is on carbidopa/levodopa 25/100 three times per day.  She is doing well but notices R arm tremor now that had gone away.  Rarely she will note tremor in the L hand which she had not noted previously.  She does have some cramping of the feet.  She is unsure if this is associated with wearing off.  She did have some  of this while in my office, and it was time for her to redose the carbidopa/levodopa.  She is now attending therapy, and is enjoying and finding benefit from it.  08/08/12 update:  The patient presents today with her husband, who supplements the history.  The patient has Parkinson's disease and is currently on carbidopa/levodopa 25/100, one tablet 3 times per day.  Last visit, she was describing some cramping in her feet and I was trying to figure out whether or not this was an "off" phenomenon.  After paying attention, the patient called and said that she thought it was and we added entacapone.  The cramping in the feet is better but she is now having a stabbing and shooting pain down the back of the leg and to the lateral side of the lower leg that stops at the ankle.  It has stopped her from exercising.  She sometimes has back pain.  This is fairly inconsistent.  The leg pain seems to start about 4 days after she finished rehabilitation, which did seem to help.  She is not sure if the pain is related to the rehabilitation, however.  10/03/13 update:  The patient presents today with her husband, who supplements the history.  The patient is currently on carbidopa/levodopa 25/100, one  tablet 3 times per day.  She is also on Comtan 200 mg, one tablet 3 times per day.  From a Parkinson's standpoint, she has been doing well.  No falls.  No hallucinations.  Her husband rarely sees tremor, and that is primarily when she is in bed at night and he will feel it rather than see it.  Last visit, she was complaining of back pain that was likely sciatica.  I ordered an EMG but they ultimately canceled that.  I tried her on Lyrica but that caused her to be excessively sleepy.  She went to the chiropractor and states that she is doing much better from that regard.  Unfortunately, she has had bronchitis and a urinary tract infection.  She has been on several antibiotics, which have caused her to be nauseated.  She has also had  dental work and had several of her teeth pulled.  Because of these things, she has had a decreased appetite and really has not been able to eat.  She had a temporary partial placed, but her appetite still hasn't been what it used to be, primarily because of the nausea from the antibiotic.  She has not been exercising because of being sick.  She is hoping to get back to that soon.  02/02/14 update:  The patient presents today with her husband, who supplements the history.  The patient is currently on carbidopa/levodopa 25/100, 1 tablet at 9 AM/1 PM and 2 tablets at 6 PM.   She is also on Comtan 200 mg, one tablet 3 times per day.   The discomfort in the foot is much better.  From a Parkinson's standpoint, she has been doing well.  No falls.  No hallucinations.  The patient was screened through our Parkinson's screening program on 01/16/2014 and was either at baseline or better than baseline at all therapies and is scheduled for re-screening in January.  She is not currently exercising.    06/05/14 update:  The patient presents today with her husband, who supplements the history.  The patient is currently on carbidopa/levodopa 25/100, 1 tablet at 9 AM/1 PM and 2 tablets at 6 PM.   She is also on Comtan 200 mg, one tablet 3 times per day.  No falls.  No lightheadedness or near syncope.  No hallucinations.   I reviewed records since last visit.  Weight loss has been a concern.  The dietician has talked with her about suggestions to help, and the patient states that she was told to take Carnation 3 times per day.  Pt states that the biggest issue that has caused weight loss has been persistent nausea, which has been going on for about 2 months.  They really do not think that it is related to the Parkinson's medications, because it did not start with changes in Parkinson's medications.  It actually seemed to start after a visit to a restaurant and then persisted.  She just feels nauseated, has lost taste for food and  has lost appetite.  She followed up with Dr. Kenton Kingfisher and was started on Zantac about 3 or 4 days ago and does feel better and her blood pressure medication was discontinued.  10/04/14 update:  The patient presents today with her husband, who supplements the history.  The patient is currently on carbidopa/levodopa 25/100, 1 tablet at 9 AM/1 PM and 2 tablets at 6 PM.   She is also on Comtan 200 mg, one tablet 3 times per day. We d/c her comtan  for a week to see if her GI upset changed at all and it did not so we restarted it since obviously that was not the etiology.  She continues to have GI upset in the AM.  She went to her PCP and she was given Zofran and states that it made it worse but she only took it once.  It is only the AM when she has nausea and she does better the rest of the day.    12/14/14 update:  The patient returns today, accompanied by her husband who supplements the history.  Records were reviewed since last visit.  She has a history of breast cancer and was having weight loss/anorexia and saw her oncologist.  She ended up having a CT chest, abdomen and pelvis and those were unremarkable for any new lesions.  She has lost about 13 pounds since March of last year.  She has been complaining about nausea the last several visits.  In fact, we've previously stopped her entacapone to see if that would help the nausea, but it did not.  She did not think that it was related at all to her levodopa.  She no longer has the nausea but in the AM she doesn't feel good.  She has difficulty describing what this means.  She remains on carbidopa/levodopa 25/100, 1 tablet at 9 AM/one tablet at 1 PM and 2 tablets at 6 PM.  She is still on entacapone, 200 mg 3 times a day.  They brought with them a list of blood pressures and while the SBP is generally high (170's-190's without med and 150's-180's with BP med) it was never over 200.  She states that she was off the BP med a few days and felt better but when she went  back on it she felt worse again.  She was told to go back on it because it her BP's were too high.  She is only on amlodipine 2.5 mg.    03/05/15 update:  The patient is following up today regarding her Parkinson's disease.  She is currently on carbidopa/levodopa 25/100, 1 tablet at 9 AM, 1 tablet at 1 PM and 2 tablets at 6 PM.  She takes entacapone with each of these dosages.  At last visit, she was very focused on her blood pressure and I asked her to follow-up with her primary care physician to see if she actually needed her blood pressure medication.  Today, the patient and her husband state that she was able to d/c her amlodipine and her BP has been well controlled. She states that the nausea is better.  She states that she gets up in the AM and takes her med and just feels jittery and anxious.  Her husband asks about trying to take a levodopa at bedtime to see if that helps.  When she takes her xanax, the anxiety does seem to get better.  They were thinking about asking PCP to see if she can go up on the medication.  Husband admits that balance sometimes off and cognition not great.  PT did not think that she needed walker per their hx.  She has gotten back to her exercise.   By 3-4 pm, pt feels well.    06/11/15 update:  The patient presents today for follow-up, accompanied by her husband who supplements the history.  She is on carbidopa/levodopa 25/100, 1 tablet at 9 AM, 1 tablet at 1 PM and 2 tablets at 6 PM.  She is on entacapone  200 mg 3 times a day and last visit we added carbidopa/levodopa 50/200 at night.  She didn't notice a big difference with that. Her husband isn't sure that she really gave it enough time to know.   Her biggest issue last visit was her anxiety and I told her to try an extra levodopa when she is anxious to see if that helps.  They tried that and it didn't help.    I did tell her that I did not want her taking daily Xanax.  Today, the patient states that the "only thing that helps  is xanax."   Unfortunately, she has not been exercising.  Her husband is undergoing tx daily for prostate CA and they are consumed with that.  Balance has been okay and her husband states that she is doing well in that regard.  No falls.  She is sleeping from 9pm until 5-6am. She is taking naps during the day.   09/11/15 update:  The patient follows up today, accompanied by her husband who supplements the history.  She is on carbidopa/levodopa 25/100, 1 tablet at 9 AM, 1 at 1 PM and 2 tablets at 6 PM.  I asked her to retry carbidopa/levodopa 50/200 at night last visit.  She is still on the entacapone 200 mg 3 times per day.  Last visit, I encouraged her to discontinue her daily Xanax and we started her on Wellbutrin XL for her anxiety.  Her husband thinks that she is still on it (and off of xanax) but wellbutrin is not on her medication list today.  Anxiety has been better per both patient and husband.  Husband states that Dr. Kenton Kingfisher told them to ask me about sertraline.  She was going to come in and talk to me on January 11, but ended up having increased hallucinations and was admitted to the hospital from January 11 to 08/11/2015.  She was treated for urinary tract infection although her urine culture ended up being negative.  Husband reports that she is 90-95% better from a mental standpoint.  No hallucinations since d/c from the hospital.  Doing home PT/OT/ST and nurse comes once per week.    She was referred last visit to cardiology for her heart murmur.  She saw the cardiologist on 09/02/2015 and has an echocardiogram pending.    Neuroimaging has  previously been performed.  An MRI brain was last done in 2010 but pt can no longer have MRI's because of an implantable hearing device.    PREVIOUS MEDICATIONS: none to date  ALLERGIES:  No Known Allergies  CURRENT MEDICATIONS:  Current Outpatient Prescriptions on File Prior to Visit  Medication Sig Dispense Refill  . acetaminophen (TYLENOL) 325 MG  tablet Take 650 mg by mouth every 6 (six) hours as needed for moderate pain.     [name] acyclovir (ZOVIRAX) 400 MG tablet TAKE 1 TABLET BY MOUTH TWICE DAILY 60 tablet 11  . ALPRAZolam (XANAX) 0.25 MG tablet Take 0.25 mg by mouth 2 (two) times daily as needed for anxiety. Patient taking approx 2 times weekly    . anastrozole (ARIMIDEX) 1 MG tablet TAKE ONE (1) TABLET BY MOUTH EVERY DAY 90 tablet 3  . bisacodyl (DULCOLAX) 5 MG EC tablet Take 5 mg by mouth daily as needed for moderate constipation.    [name] CALCIUM PO Take 600 mg by mouth 2 (two) times daily.    . carbidopa-levodopa (SINEMET CR) 50-200 MG per tablet Take 1 tablet by mouth at bedtime. 90 tablet  3  . carbidopa-levodopa (SINEMET IR) 25-100 MG tablet 1 tablet in the morning by mouth, 1 in the afternoon by mouth, 2 in the evening by mouth.    . cetirizine (ZYRTEC) 10 MG tablet Take 10 mg by mouth daily.    . cholecalciferol (VITAMIN D) 1000 units tablet Take 1,000 Units by mouth daily.    [name] docusate sodium (COLACE) 100 MG capsule Take 1 capsule (100 mg total) by mouth 2 (two) times daily. 30 capsule 0  . entacapone (COMTAN) 200 MG tablet TAKE 1 TABLET (200 MG TOTAL) BY MOUTH 3 (THREE) TIMES DAILY. 270 tablet 3  . Fluticasone-Salmeterol (ADVAIR DISKUS) 100-50 MCG/DOSE AEPB Inhale 1 puff into the lungs every 12 (twelve) hours.     . Multiple Vitamins-Minerals (MULTIVITAMIN WITH MINERALS) tablet Take 1 tablet by mouth daily.      . ondansetron (ZOFRAN) 8 MG tablet Take by mouth every 8 (eight) hours as needed for nausea or vomiting.    [name] oxybutynin (DITROPAN-XL) 10 MG 24 hr tablet Take 10 mg by mouth at bedtime.    . ranitidine (ZANTAC) 300 MG tablet Take 300 mg by mouth at bedtime.      No current facility-administered medications on file prior to visit.    PAST MEDICAL HISTORY:   Past Medical History  Diagnosis Date  . Asthma   . Hearing loss   . Diabetes mellitus without complication ([place])     [id number].Marland KitchenMarland Kitchenpt denies  . GERD (gastroesophageal  reflux disease)   . Depression   . Anxiety   . Parkinson disease ([place])   . NHL (non-Hodgkin's lymphoma) ([place])     nhl dx 9/04 breast ca dx1/12    PAST SURGICAL HISTORY:   Past Surgical History  Procedure Laterality Date  . Mastectomy  2 /8/ 12    bilateral  . Ba-ha ear implant    . Mastectomy      SOCIAL HISTORY:   Social History   Social History  . Marital Status: Married    Spouse Name: N/A  . Number of Children: N/A  . Years of Education: N/A   Occupational History  . retired     Pharmacist, hospital   Social History Main Topics  . Smoking status: Never Smoker   . Smokeless tobacco: Never Used     Comment: husband wsa smoker  . Alcohol Use: No     Comment: no  . Drug Use: No  . Sexual Activity: Not on file   Other Topics Concern  . Not on file   Social History Narrative    FAMILY HISTORY:   Family Status  Relation Status Death Age  . Father Deceased     CA, lung  . Mother Deceased     CA; complications of splenectomy  . Brother Deceased     3, renal failure, DM-2; CAD; CA  . Child Alive     5 (4 biological), one with PKU    ROS:  A complete 10 system review of systems was obtained and was unremarkable apart from what is mentioned above.  PHYSICAL EXAMINATION:    VITALS:   Filed Vitals:   09/11/15 1102  BP: 142/62  Pulse: 82  Height: 5' 3.5" (1.613 m)  Weight: 113 lb (51.256 kg)   Wt Readings from Last 3 Encounters:  09/11/15 113 lb (51.256 kg)  09/02/15 111 lb 1.9 oz (50.404 kg)  08/07/15 105 lb 3.2 oz (47.718 kg)     GEN:  The patient appears stated age  and is in NAD. CV:  RRR with 3/6 SEM Lungs:  CTAB Neck:  No bruits.     Neurological examination:  Orientation: She is alert and oriented to person and place.  She is repetitive and asks the same question over and over. Montreal Cognitive Assessment  09/11/2015 12/14/2014  Visuospatial/ Executive (0/5) 3 3  Naming (0/3) 3 3  Attention: Read list of digits (0/2) 1 2  Attention: Read  list of letters (0/1) 1 1  Attention: Serial 7 subtraction starting at 100 (0/3) 0 0  Language: Repeat phrase (0/2) 0 1  Language : Fluency (0/1) 1 1  Abstraction (0/2) 1 1  Delayed Recall (0/5) 0 0  Orientation (0/6) 6 6  Total 16 18  Adjusted Score (based on education) 17 19   Cranial nerves: There is good facial symmetry.  The visual fields are full to confrontational testing. The speech is fluent and clear.  She is hypophonic.  Soft palate rises symmetrically and there is no tongue deviation. Hearing is intact to conversational tone. Sensation: Sensation is intact to light outh throughout. Motor: Strength is 5/5 in the bilateral upper and lower extremities.   Shoulder shrug is equal and symmetric.  There is no pronator drift.   Movement examination: Tone: There is no rigidity. Abnormal movements: There is no tremor today. Coordination:  There is no decremation noted today Gait and Station: The patient no has trouble arising out of the chair without use of her hands today.  Stride length and arm swing are good. Labs:  Lab Results  Component Value Date   WBC 4.3 08/11/2015   HGB 10.6* 08/11/2015   HCT 31.0* 08/11/2015   MCV 92.0 08/11/2015   PLT 159 08/11/2015     Chemistry      Component Value Date/Time   NA 144 08/11/2015 0456   NA 142 06/13/2015 0947   K 3.6 08/11/2015 0456   K 4.2 06/13/2015 0947   CL 111 08/11/2015 0456   CL 106 12/26/2012 1110   CO2 26 08/11/2015 0456   CO2 29 06/13/2015 0947   BUN 14 08/11/2015 0456   BUN 16.4 06/13/2015 0947   CREATININE 0.86 08/11/2015 0456   CREATININE 1.2* 06/13/2015 0947      Component Value Date/Time   CALCIUM 7.9* 08/11/2015 0456   CALCIUM 9.6 06/13/2015 0947   CALCIUM 11.1* 07/15/2006 1212   ALKPHOS 90 06/13/2015 0947   ALKPHOS 88 02/07/2015 1428   AST 23 06/13/2015 0947   AST 40 02/07/2015 1428   ALT <9 06/13/2015 0947   ALT 8* 02/07/2015 1428   BILITOT 0.47 06/13/2015 0947   BILITOT 1.1 02/07/2015 1428       Lab Results  Component Value Date   VITAMINB12 1053* 06/10/2006   Lab Results  Component Value Date   TSH 2.93 09/19/2012    ASSESSMENT/PLAN:  1.  Idiopathic Parkinson's disease, dx 08/2012.  This is evidenced by bradykinesia, tremor, postural instability and rigidity.  There are no atypical features.  -We discussed the diagnosis as well as pathophysiology of the disease.  We discussed treatment options as well as prognostic indicators.  Patient education was provided.  - she is currently on carbidopa/levodopa 25/100 one tablet at 9 AM/one tablet at 1 PM and 2 tablets at 6 PM and will continue this.  She needs to avoid protein when taking levodopa and discussed this.  Risks, benefits, side effects and alternative therapies were discussed.  The opportunity to ask questions was given and  they were answered to the best of my ability.  The patient expressed understanding and willingness to follow the outlined treatment protocols.  -Pt is on entacapone tid and she will continue on this.    -was supposed to restart carbidopa/levodopa 50/200 q hs last visit but not sure if they did.  Husband will go home and recheck her bottles and let me know. 2.  Weight loss/AM nausea  -This has resolved and her weight has actually increased now that using ensure regularly.  Do not use ensure with levodopa (at same time) 3.  Anxiety  -off xanax but question of whether on wellbutrin or not and husband to go home and look at meds and let me know.  Needs to be on something for anxiety.  PCP asked them to ask me about zoloft but if she is on wellbutrin would rather increase that if need more medication than add additional.   4.  Cardiac murmur  -being w/u by cardiology now 5.  PDD  -dont want to add further meds right now.  Is free of hallucinations but if occur again, will consider nuplazid.  Discussed with patient and husband today. 6. Follow up is anticipated in the next few months, sooner should new  neurologic issues arise.

## 2015-09-11 NOTE — Telephone Encounter (Signed)
1 

## 2015-09-11 NOTE — Telephone Encounter (Signed)
Patient's husband [name]) called about a medication he had discussed with Dr. [name].

## 2015-09-11 NOTE — Patient Instructions (Signed)
1.  Call me and let me know if you are on or off of carbidopa/levodopa 50/200 at bedtime.  Also let me know if you are on wellbutrin XL (buprorion XL) 150.

## 2015-09-12 ENCOUNTER — Other Ambulatory Visit: Payer: Self-pay

## 2015-09-12 ENCOUNTER — Ambulatory Visit (HOSPITAL_COMMUNITY): Payer: Medicare Other | Attending: Internal Medicine

## 2015-09-12 DIAGNOSIS — I517 Cardiomegaly: Secondary | ICD-10-CM | POA: Insufficient documentation

## 2015-09-12 DIAGNOSIS — Z853 Personal history of malignant neoplasm of breast: Secondary | ICD-10-CM | POA: Insufficient documentation

## 2015-09-12 DIAGNOSIS — E119 Type 2 diabetes mellitus without complications: Secondary | ICD-10-CM | POA: Insufficient documentation

## 2015-09-12 DIAGNOSIS — I071 Rheumatic tricuspid insufficiency: Secondary | ICD-10-CM | POA: Insufficient documentation

## 2015-09-12 DIAGNOSIS — R011 Cardiac murmur, unspecified: Secondary | ICD-10-CM | POA: Diagnosis not present

## 2015-09-12 DIAGNOSIS — I340 Nonrheumatic mitral (valve) insufficiency: Secondary | ICD-10-CM | POA: Diagnosis not present

## 2015-09-12 DIAGNOSIS — J45909 Unspecified asthma, uncomplicated: Secondary | ICD-10-CM | POA: Diagnosis not present

## 2015-09-12 DIAGNOSIS — I350 Nonrheumatic aortic (valve) stenosis: Secondary | ICD-10-CM | POA: Insufficient documentation

## 2015-09-12 DIAGNOSIS — G20 Parkinson's disease: Secondary | ICD-10-CM | POA: Diagnosis not present

## 2015-09-12 DIAGNOSIS — Z8572 Personal history of non-Hodgkin lymphomas: Secondary | ICD-10-CM | POA: Diagnosis not present

## 2015-09-12 DIAGNOSIS — Z9013 Acquired absence of bilateral breasts and nipples: Secondary | ICD-10-CM | POA: Insufficient documentation

## 2015-09-12 NOTE — Telephone Encounter (Signed)
Why don't we just try the zoloft (sertraline) that Dr. Kenton Kingfisher suggested to them instead of the wellbutrin. Start with 25 mg for a week and then 50 thereafter and will re-eval in future.  Will hold carbidopa/levodopa 50/200 for now since don't want to start 2 drugs at once

## 2015-09-12 NOTE — Telephone Encounter (Signed)
Spoke with patient's husband and he states that patient stopped Wellbutrin after hospitalization because they were told to do so. Patient also not taking Carbidopa Levodopa 50/200 since that time- but he doesn't know why he stopped giving her this. Please advise if should restart medications.

## 2015-09-12 NOTE — Telephone Encounter (Signed)
Left message on machine for patient to call back.

## 2015-09-13 DIAGNOSIS — M6281 Muscle weakness (generalized): Secondary | ICD-10-CM | POA: Diagnosis not present

## 2015-09-13 DIAGNOSIS — C8590 Non-Hodgkin lymphoma, unspecified, unspecified site: Secondary | ICD-10-CM | POA: Diagnosis not present

## 2015-09-13 DIAGNOSIS — G20 Parkinson's disease: Secondary | ICD-10-CM | POA: Diagnosis not present

## 2015-09-13 DIAGNOSIS — F0280 Dementia in other diseases classified elsewhere without behavioral disturbance: Secondary | ICD-10-CM | POA: Diagnosis not present

## 2015-09-13 DIAGNOSIS — F329 Major depressive disorder, single episode, unspecified: Secondary | ICD-10-CM | POA: Diagnosis not present

## 2015-09-13 DIAGNOSIS — E119 Type 2 diabetes mellitus without complications: Secondary | ICD-10-CM | POA: Diagnosis not present

## 2015-09-13 MED ORDER — SERTRALINE HCL 25 MG PO TABS: ORAL_TABLET | ORAL | Status: DC

## 2015-09-13 NOTE — Telephone Encounter (Signed)
Patient's husband made aware. Zoloft RX sent to pharmacy.

## 2015-09-13 NOTE — Addendum Note (Signed)
Addended byAnnamaria Helling on: 09/13/2015 09:39 AM   Modules accepted: Orders, Medications

## 2015-09-16 ENCOUNTER — Telehealth: Payer: Self-pay | Admitting: Oncology

## 2015-09-16 ENCOUNTER — Ambulatory Visit (HOSPITAL_BASED_OUTPATIENT_CLINIC_OR_DEPARTMENT_OTHER): Payer: Medicare Other | Admitting: Oncology

## 2015-09-16 ENCOUNTER — Telehealth: Payer: Self-pay | Admitting: Neurology

## 2015-09-16 VITALS — BP 142/54 | HR 81 | Temp 97.6°F | Resp 18 | Ht 63.5 in | Wt 114.6 lb

## 2015-09-16 DIAGNOSIS — M6281 Muscle weakness (generalized): Secondary | ICD-10-CM | POA: Diagnosis not present

## 2015-09-16 DIAGNOSIS — C8590 Non-Hodgkin lymphoma, unspecified, unspecified site: Secondary | ICD-10-CM | POA: Diagnosis not present

## 2015-09-16 DIAGNOSIS — F0280 Dementia in other diseases classified elsewhere without behavioral disturbance: Secondary | ICD-10-CM | POA: Diagnosis not present

## 2015-09-16 DIAGNOSIS — D6481 Anemia due to antineoplastic chemotherapy: Secondary | ICD-10-CM | POA: Diagnosis not present

## 2015-09-16 DIAGNOSIS — F329 Major depressive disorder, single episode, unspecified: Secondary | ICD-10-CM | POA: Diagnosis not present

## 2015-09-16 DIAGNOSIS — G20 Parkinson's disease: Secondary | ICD-10-CM | POA: Diagnosis not present

## 2015-09-16 DIAGNOSIS — Z853 Personal history of malignant neoplasm of breast: Secondary | ICD-10-CM | POA: Diagnosis not present

## 2015-09-16 DIAGNOSIS — E119 Type 2 diabetes mellitus without complications: Secondary | ICD-10-CM | POA: Diagnosis not present

## 2015-09-16 NOTE — Telephone Encounter (Signed)
Find out from her husband what "real sick" means because she has a LONG hx of morning nausea so want to make sure that this is something different.  And make sure that it was just one 25 mg tablet?

## 2015-09-16 NOTE — Telephone Encounter (Signed)
Patient's husband given instructions.

## 2015-09-16 NOTE — Telephone Encounter (Signed)
PT called and said the medication Sertraline  Dr Tat prescribed made her very sick and wanted to know if there was something else she could take/Dawn CB# [phone] or [phone]

## 2015-09-16 NOTE — Telephone Encounter (Signed)
Yes.  I don't think that is from that.  She has that c/o a lot.  Have her try it at bedtime

## 2015-09-16 NOTE — Telephone Encounter (Signed)
Patient took only one 25 mg pill.  She felt jittery and could hardly walk.  Should she try it again?

## 2015-09-16 NOTE — Telephone Encounter (Signed)
Pt confirmed labs/ov per 02/20 POF, gave pt AVS and Calendar... KJ °

## 2015-09-16 NOTE — Telephone Encounter (Signed)
Please advise 

## 2015-09-16 NOTE — Progress Notes (Signed)
  [facility] OFFICE PROGRESS NOTE   Diagnosis: breast cancer, non-Hodgkin's lymphoma  INTERVAL HISTORY:   [name] returns as scheduled. She was admitted in January with altered mental status and a urinary tract infection. She now feels well. She continues Arimidex.  Objective:  Vital signs in last 24 hours:  Blood pressure 142/54, pulse 81, temperature 97.6 F (36.4 C), temperature source Axillary, resp. rate 18, height 5' 3.5" (1.613 m), weight 114 lb 9.6 oz (51.982 kg), SpO2 97 %.    HEENT: neck without mass Lymphatics: no cervical, supraclavicular, axillary, or inguinal nodes Resp: lungs clear bilaterally Cardio: regular rate and rhythm GI: no hepatosplenomegaly, no mass Vascular: no leg edema Breast: Status post bilateral mastectomy. No evidence forchest wall tumor recurrence.      Lab Results:  Lab Results  Component Value Date   WBC 4.3 08/11/2015   HGB 10.6* 08/11/2015   HCT 31.0* 08/11/2015   MCV 92.0 08/11/2015   PLT 159 08/11/2015   NEUTROABS 6.5 08/07/2015     Medications: I have reviewed the patient's current medications.  Assessment/Plan: 1. Non-Hodgkin lymphoma treated with fludarabine/rituximab, last in July of 2009.  1. Restaging CT 11/24/2010 revealed evidence of progressive lymphoma in the abdomen and pelvis.  2. Initiation of salvage therapy with bendamustine/rituximab 12/04/2010. She completed 3 cycles.  3. Restaging CT 03/03/2011 revealed stable retroperitoneal soft tissue and a marked decrease in the soft tissue thickening associated with dilated loops of small bowel. 2. History of ITP. Intermittent mild thrombocytopenia persists. 3. History of Herpes zoster-maintained on prophylactic acyclovir. She will decrease the acyclovir to once daily 4. Anemia secondary to non-Hodgkin lymphoma, chemotherapy, and a history of autoimmune hemolysis.  5. Hypertension. 6. Right-hand tremor/ataxia-followed by neurology, now taking  Sinemet, diagnosed with Parkinson's disease 7. Hearing loss-status post placement of an implanted hearing device by Dr. Cresenciano Lick. 8. Bilateral breast cancer, right breast cancer-grade 1, T1 N0, ER/PR positive, and HER2 negative. Left breast cancer, synchronous grade 2, T2 N0, ER/PR positive, and HER2 negative. She began adjuvant Arimidex on 09/15/2010,completed February 2017 9. Inflammatory changes of both lungs on a CT of the chest 11/24/2010-progressive on the restaging CT 03/03/2011, status post a bronchoscopy 03/17/2011 with no evidence of granulomata or tumor. 10. 11 mm spiculated lesion in the lingula on a CT 11/24/2010-less distinct on the CT 03/03/2011. 11. Upper respiratory infection while visiting her son in [place], June 2013, resolved after treatment with Levaquin/steroids 12. Upper respiratory infection,? Pneumonia summer 2014-chest x-ray 02/17/2013 with bilateral infiltrates 13. CT chest abdomen and pelvis on 11/29/2014 with no evidence of active lymphoma or metastatic breast cancer. Previously demonstrated retroperitoneal process appeared improved with probable residual fibrosis. No discrete adenopathy. Interval near complete resolution of patchy airspace opacities in both lungs.      Disposition:  She remains in clinical remission from non-Hodgkin's lymphoma and breast cancer. She has completed 5 years of adjuvant aromatase inhibitor therapy. She will discontinue Arimidex when she finishes the current bottle. She will return for an office and lab visit in 3 months.  Betsy Coder, MD  09/16/2015  2:09 PM

## 2015-09-17 DIAGNOSIS — M6281 Muscle weakness (generalized): Secondary | ICD-10-CM | POA: Diagnosis not present

## 2015-09-17 DIAGNOSIS — C8590 Non-Hodgkin lymphoma, unspecified, unspecified site: Secondary | ICD-10-CM | POA: Diagnosis not present

## 2015-09-17 DIAGNOSIS — G20 Parkinson's disease: Secondary | ICD-10-CM | POA: Diagnosis not present

## 2015-09-17 DIAGNOSIS — F0280 Dementia in other diseases classified elsewhere without behavioral disturbance: Secondary | ICD-10-CM | POA: Diagnosis not present

## 2015-09-17 DIAGNOSIS — F329 Major depressive disorder, single episode, unspecified: Secondary | ICD-10-CM | POA: Diagnosis not present

## 2015-09-17 DIAGNOSIS — E119 Type 2 diabetes mellitus without complications: Secondary | ICD-10-CM | POA: Diagnosis not present

## 2015-09-18 DIAGNOSIS — M6281 Muscle weakness (generalized): Secondary | ICD-10-CM | POA: Diagnosis not present

## 2015-09-18 DIAGNOSIS — C8590 Non-Hodgkin lymphoma, unspecified, unspecified site: Secondary | ICD-10-CM | POA: Diagnosis not present

## 2015-09-18 DIAGNOSIS — G20 Parkinson's disease: Secondary | ICD-10-CM | POA: Diagnosis not present

## 2015-09-18 DIAGNOSIS — E119 Type 2 diabetes mellitus without complications: Secondary | ICD-10-CM | POA: Diagnosis not present

## 2015-09-18 DIAGNOSIS — F0280 Dementia in other diseases classified elsewhere without behavioral disturbance: Secondary | ICD-10-CM | POA: Diagnosis not present

## 2015-09-18 DIAGNOSIS — F329 Major depressive disorder, single episode, unspecified: Secondary | ICD-10-CM | POA: Diagnosis not present

## 2015-09-19 DIAGNOSIS — G20 Parkinson's disease: Secondary | ICD-10-CM | POA: Diagnosis not present

## 2015-09-19 DIAGNOSIS — M6281 Muscle weakness (generalized): Secondary | ICD-10-CM | POA: Diagnosis not present

## 2015-09-19 DIAGNOSIS — E119 Type 2 diabetes mellitus without complications: Secondary | ICD-10-CM | POA: Diagnosis not present

## 2015-09-19 DIAGNOSIS — C8590 Non-Hodgkin lymphoma, unspecified, unspecified site: Secondary | ICD-10-CM | POA: Diagnosis not present

## 2015-09-19 DIAGNOSIS — F0280 Dementia in other diseases classified elsewhere without behavioral disturbance: Secondary | ICD-10-CM | POA: Diagnosis not present

## 2015-09-19 DIAGNOSIS — F329 Major depressive disorder, single episode, unspecified: Secondary | ICD-10-CM | POA: Diagnosis not present

## 2015-09-20 DIAGNOSIS — M6281 Muscle weakness (generalized): Secondary | ICD-10-CM | POA: Diagnosis not present

## 2015-09-20 DIAGNOSIS — F0280 Dementia in other diseases classified elsewhere without behavioral disturbance: Secondary | ICD-10-CM | POA: Diagnosis not present

## 2015-09-20 DIAGNOSIS — F329 Major depressive disorder, single episode, unspecified: Secondary | ICD-10-CM | POA: Diagnosis not present

## 2015-09-20 DIAGNOSIS — E119 Type 2 diabetes mellitus without complications: Secondary | ICD-10-CM | POA: Diagnosis not present

## 2015-09-20 DIAGNOSIS — G20 Parkinson's disease: Secondary | ICD-10-CM | POA: Diagnosis not present

## 2015-09-20 DIAGNOSIS — C8590 Non-Hodgkin lymphoma, unspecified, unspecified site: Secondary | ICD-10-CM | POA: Diagnosis not present

## 2015-09-24 DIAGNOSIS — F329 Major depressive disorder, single episode, unspecified: Secondary | ICD-10-CM | POA: Diagnosis not present

## 2015-09-24 DIAGNOSIS — C8590 Non-Hodgkin lymphoma, unspecified, unspecified site: Secondary | ICD-10-CM | POA: Diagnosis not present

## 2015-09-24 DIAGNOSIS — M6281 Muscle weakness (generalized): Secondary | ICD-10-CM | POA: Diagnosis not present

## 2015-09-24 DIAGNOSIS — E119 Type 2 diabetes mellitus without complications: Secondary | ICD-10-CM | POA: Diagnosis not present

## 2015-09-24 DIAGNOSIS — G20 Parkinson's disease: Secondary | ICD-10-CM | POA: Diagnosis not present

## 2015-09-24 DIAGNOSIS — F0280 Dementia in other diseases classified elsewhere without behavioral disturbance: Secondary | ICD-10-CM | POA: Diagnosis not present

## 2015-09-25 DIAGNOSIS — F329 Major depressive disorder, single episode, unspecified: Secondary | ICD-10-CM | POA: Diagnosis not present

## 2015-09-25 DIAGNOSIS — F0280 Dementia in other diseases classified elsewhere without behavioral disturbance: Secondary | ICD-10-CM | POA: Diagnosis not present

## 2015-09-25 DIAGNOSIS — C8590 Non-Hodgkin lymphoma, unspecified, unspecified site: Secondary | ICD-10-CM | POA: Diagnosis not present

## 2015-09-25 DIAGNOSIS — G20 Parkinson's disease: Secondary | ICD-10-CM | POA: Diagnosis not present

## 2015-09-25 DIAGNOSIS — M6281 Muscle weakness (generalized): Secondary | ICD-10-CM | POA: Diagnosis not present

## 2015-09-25 DIAGNOSIS — E119 Type 2 diabetes mellitus without complications: Secondary | ICD-10-CM | POA: Diagnosis not present

## 2015-09-26 DIAGNOSIS — F0280 Dementia in other diseases classified elsewhere without behavioral disturbance: Secondary | ICD-10-CM | POA: Diagnosis not present

## 2015-09-26 DIAGNOSIS — G20 Parkinson's disease: Secondary | ICD-10-CM | POA: Diagnosis not present

## 2015-09-26 DIAGNOSIS — C8590 Non-Hodgkin lymphoma, unspecified, unspecified site: Secondary | ICD-10-CM | POA: Diagnosis not present

## 2015-09-26 DIAGNOSIS — E119 Type 2 diabetes mellitus without complications: Secondary | ICD-10-CM | POA: Diagnosis not present

## 2015-09-26 DIAGNOSIS — M6281 Muscle weakness (generalized): Secondary | ICD-10-CM | POA: Diagnosis not present

## 2015-09-26 DIAGNOSIS — F329 Major depressive disorder, single episode, unspecified: Secondary | ICD-10-CM | POA: Diagnosis not present

## 2015-09-27 DIAGNOSIS — F329 Major depressive disorder, single episode, unspecified: Secondary | ICD-10-CM | POA: Diagnosis not present

## 2015-09-27 DIAGNOSIS — E119 Type 2 diabetes mellitus without complications: Secondary | ICD-10-CM | POA: Diagnosis not present

## 2015-09-27 DIAGNOSIS — G20 Parkinson's disease: Secondary | ICD-10-CM | POA: Diagnosis not present

## 2015-09-27 DIAGNOSIS — F0280 Dementia in other diseases classified elsewhere without behavioral disturbance: Secondary | ICD-10-CM | POA: Diagnosis not present

## 2015-09-27 DIAGNOSIS — C8590 Non-Hodgkin lymphoma, unspecified, unspecified site: Secondary | ICD-10-CM | POA: Diagnosis not present

## 2015-09-27 DIAGNOSIS — M6281 Muscle weakness (generalized): Secondary | ICD-10-CM | POA: Diagnosis not present

## 2015-09-30 DIAGNOSIS — L309 Dermatitis, unspecified: Secondary | ICD-10-CM | POA: Diagnosis not present

## 2015-09-30 DIAGNOSIS — F419 Anxiety disorder, unspecified: Secondary | ICD-10-CM | POA: Diagnosis not present

## 2015-09-30 DIAGNOSIS — G20 Parkinson's disease: Secondary | ICD-10-CM | POA: Diagnosis not present

## 2015-09-30 DIAGNOSIS — E785 Hyperlipidemia, unspecified: Secondary | ICD-10-CM | POA: Diagnosis not present

## 2015-09-30 DIAGNOSIS — F329 Major depressive disorder, single episode, unspecified: Secondary | ICD-10-CM | POA: Diagnosis not present

## 2015-10-01 DIAGNOSIS — M6281 Muscle weakness (generalized): Secondary | ICD-10-CM | POA: Diagnosis not present

## 2015-10-01 DIAGNOSIS — G20 Parkinson's disease: Secondary | ICD-10-CM | POA: Diagnosis not present

## 2015-10-01 DIAGNOSIS — C8590 Non-Hodgkin lymphoma, unspecified, unspecified site: Secondary | ICD-10-CM | POA: Diagnosis not present

## 2015-10-01 DIAGNOSIS — F329 Major depressive disorder, single episode, unspecified: Secondary | ICD-10-CM | POA: Diagnosis not present

## 2015-10-01 DIAGNOSIS — F0280 Dementia in other diseases classified elsewhere without behavioral disturbance: Secondary | ICD-10-CM | POA: Diagnosis not present

## 2015-10-01 DIAGNOSIS — E119 Type 2 diabetes mellitus without complications: Secondary | ICD-10-CM | POA: Diagnosis not present

## 2015-10-03 DIAGNOSIS — F329 Major depressive disorder, single episode, unspecified: Secondary | ICD-10-CM | POA: Diagnosis not present

## 2015-10-03 DIAGNOSIS — M6281 Muscle weakness (generalized): Secondary | ICD-10-CM | POA: Diagnosis not present

## 2015-10-03 DIAGNOSIS — E119 Type 2 diabetes mellitus without complications: Secondary | ICD-10-CM | POA: Diagnosis not present

## 2015-10-03 DIAGNOSIS — C8590 Non-Hodgkin lymphoma, unspecified, unspecified site: Secondary | ICD-10-CM | POA: Diagnosis not present

## 2015-10-03 DIAGNOSIS — G20 Parkinson's disease: Secondary | ICD-10-CM | POA: Diagnosis not present

## 2015-10-03 DIAGNOSIS — F0280 Dementia in other diseases classified elsewhere without behavioral disturbance: Secondary | ICD-10-CM | POA: Diagnosis not present

## 2015-10-07 DIAGNOSIS — C8590 Non-Hodgkin lymphoma, unspecified, unspecified site: Secondary | ICD-10-CM | POA: Diagnosis not present

## 2015-10-07 DIAGNOSIS — G20 Parkinson's disease: Secondary | ICD-10-CM | POA: Diagnosis not present

## 2015-10-07 DIAGNOSIS — F0280 Dementia in other diseases classified elsewhere without behavioral disturbance: Secondary | ICD-10-CM | POA: Diagnosis not present

## 2015-10-07 DIAGNOSIS — E119 Type 2 diabetes mellitus without complications: Secondary | ICD-10-CM | POA: Diagnosis not present

## 2015-10-07 DIAGNOSIS — F329 Major depressive disorder, single episode, unspecified: Secondary | ICD-10-CM | POA: Diagnosis not present

## 2015-10-07 DIAGNOSIS — M6281 Muscle weakness (generalized): Secondary | ICD-10-CM | POA: Diagnosis not present

## 2015-10-08 DIAGNOSIS — C8590 Non-Hodgkin lymphoma, unspecified, unspecified site: Secondary | ICD-10-CM | POA: Diagnosis not present

## 2015-10-08 DIAGNOSIS — E119 Type 2 diabetes mellitus without complications: Secondary | ICD-10-CM | POA: Diagnosis not present

## 2015-10-08 DIAGNOSIS — G20 Parkinson's disease: Secondary | ICD-10-CM | POA: Diagnosis not present

## 2015-10-08 DIAGNOSIS — F0280 Dementia in other diseases classified elsewhere without behavioral disturbance: Secondary | ICD-10-CM | POA: Diagnosis not present

## 2015-10-08 DIAGNOSIS — M6281 Muscle weakness (generalized): Secondary | ICD-10-CM | POA: Diagnosis not present

## 2015-10-08 DIAGNOSIS — F329 Major depressive disorder, single episode, unspecified: Secondary | ICD-10-CM | POA: Diagnosis not present

## 2015-10-12 DIAGNOSIS — F419 Anxiety disorder, unspecified: Secondary | ICD-10-CM | POA: Diagnosis not present

## 2015-10-12 DIAGNOSIS — C8590 Non-Hodgkin lymphoma, unspecified, unspecified site: Secondary | ICD-10-CM | POA: Diagnosis not present

## 2015-10-12 DIAGNOSIS — K219 Gastro-esophageal reflux disease without esophagitis: Secondary | ICD-10-CM | POA: Diagnosis not present

## 2015-10-12 DIAGNOSIS — Z853 Personal history of malignant neoplasm of breast: Secondary | ICD-10-CM | POA: Diagnosis not present

## 2015-10-12 DIAGNOSIS — H9190 Unspecified hearing loss, unspecified ear: Secondary | ICD-10-CM | POA: Diagnosis not present

## 2015-10-12 DIAGNOSIS — F0280 Dementia in other diseases classified elsewhere without behavioral disturbance: Secondary | ICD-10-CM | POA: Diagnosis not present

## 2015-10-12 DIAGNOSIS — Z87440 Personal history of urinary (tract) infections: Secondary | ICD-10-CM | POA: Diagnosis not present

## 2015-10-12 DIAGNOSIS — F329 Major depressive disorder, single episode, unspecified: Secondary | ICD-10-CM | POA: Diagnosis not present

## 2015-10-12 DIAGNOSIS — E119 Type 2 diabetes mellitus without complications: Secondary | ICD-10-CM | POA: Diagnosis not present

## 2015-10-12 DIAGNOSIS — J45909 Unspecified asthma, uncomplicated: Secondary | ICD-10-CM | POA: Diagnosis not present

## 2015-10-12 DIAGNOSIS — Z9181 History of falling: Secondary | ICD-10-CM | POA: Diagnosis not present

## 2015-10-12 DIAGNOSIS — G20 Parkinson's disease: Secondary | ICD-10-CM | POA: Diagnosis not present

## 2015-10-30 DIAGNOSIS — M5136 Other intervertebral disc degeneration, lumbar region: Secondary | ICD-10-CM | POA: Diagnosis not present

## 2015-10-30 DIAGNOSIS — M9905 Segmental and somatic dysfunction of pelvic region: Secondary | ICD-10-CM | POA: Diagnosis not present

## 2015-10-30 DIAGNOSIS — M9903 Segmental and somatic dysfunction of lumbar region: Secondary | ICD-10-CM | POA: Diagnosis not present

## 2015-10-30 DIAGNOSIS — M9904 Segmental and somatic dysfunction of sacral region: Secondary | ICD-10-CM | POA: Diagnosis not present

## 2015-11-04 DIAGNOSIS — M9903 Segmental and somatic dysfunction of lumbar region: Secondary | ICD-10-CM | POA: Diagnosis not present

## 2015-11-04 DIAGNOSIS — M9905 Segmental and somatic dysfunction of pelvic region: Secondary | ICD-10-CM | POA: Diagnosis not present

## 2015-11-04 DIAGNOSIS — M9904 Segmental and somatic dysfunction of sacral region: Secondary | ICD-10-CM | POA: Diagnosis not present

## 2015-11-04 DIAGNOSIS — M5136 Other intervertebral disc degeneration, lumbar region: Secondary | ICD-10-CM | POA: Diagnosis not present

## 2015-11-06 DIAGNOSIS — M9904 Segmental and somatic dysfunction of sacral region: Secondary | ICD-10-CM | POA: Diagnosis not present

## 2015-11-06 DIAGNOSIS — M5136 Other intervertebral disc degeneration, lumbar region: Secondary | ICD-10-CM | POA: Diagnosis not present

## 2015-11-06 DIAGNOSIS — M9905 Segmental and somatic dysfunction of pelvic region: Secondary | ICD-10-CM | POA: Diagnosis not present

## 2015-11-06 DIAGNOSIS — M9903 Segmental and somatic dysfunction of lumbar region: Secondary | ICD-10-CM | POA: Diagnosis not present

## 2015-11-07 ENCOUNTER — Ambulatory Visit: Payer: Medicare Other

## 2015-11-07 ENCOUNTER — Ambulatory Visit: Payer: Medicare Other | Attending: Neurology | Admitting: Physical Therapy

## 2015-11-07 ENCOUNTER — Ambulatory Visit: Payer: Medicare Other | Admitting: Occupational Therapy

## 2015-11-07 DIAGNOSIS — R29818 Other symptoms and signs involving the nervous system: Secondary | ICD-10-CM

## 2015-11-07 NOTE — Therapy (Signed)
[facility] [address] [place], [name], [address] Phone: [phone]   Fax:  [phone]  Patient Details  Name: [name] MRN: [id number] Date of Birth: 05-10-1928 Referring Provider:  [name], MD  Encounter Date: 11/07/2015   Occupational Therapy Parkinson's Disease Screen  Physical Performance Test item #4 (donning/doffing jacket):  19.88 sec  9-hole peg test:    RUE  26.78 sec        LUE  28.57 sec  Box & Blocks Test:   RUE  40 blocks        LUE  44 blocks  Change in ability to perform ADLs/IADLs:  no  Other Comments:  Change in box and blocks score (incr bradykinesia and decr coordination), but pt reports no functional change and no significant change in other scores.  Pt had recent hospitalization in January, but reports that she has resumed exercise.  Therefore, will monitor next screen.   Pt does not require occupation therapy services at this time.  Recommended occupational therapy screen in   approx 6 months.    [facility] 11/07/2015, 8:34 AM  [facility] [address] [place], [name], [address] Phone: [phone]   Fax:  [place], OTR/L [facility] [address] [place],   [address] [phone] phone [phone] 11/07/2015 9:33 AM

## 2015-11-07 NOTE — Therapy (Signed)
[facility] [address] [place], [name], [address] Phone: [phone]   Fax:  [phone]  Patient Details  Name: [name] MRN: [id number] Date of Birth: 1928/04/24 Referring Provider:  [name], MD  Encounter Date: 11/07/2015   Physical Therapy Parkinson's Disease Screen  Pt was in the hospital in January with UTI and confusion.  Had HHPT but discharged now.  Denies falls or near falls.  Was still doing previous HEP and walking 20 minutes at church.  Has been having some back pain but has seen a chiropractor.   Timed Up and Go test:  8.69 seconds normal; 8.22 seconds manual; 8.69 seconds cognitive but did not continue to count entire time-No device  10 meter walk test: 3.59 ft/second-No device  5 time sit to stand test:  11.75 seconds-No device   Above results discussed with Mady Haagensen, PT.  Patient does not require Physical Therapy services at this time.  Recommend Physical Therapy screen in 6 months    Narda Bonds 11/07/2015, 8:19 AM  [facility] [address] [place], [name], [address] Phone: [phone]   Fax:  [place], [place] 11/07/2015 8:41 AM Phone: [phone] Fax: [phone]

## 2015-11-12 DIAGNOSIS — M5136 Other intervertebral disc degeneration, lumbar region: Secondary | ICD-10-CM | POA: Diagnosis not present

## 2015-11-12 DIAGNOSIS — M9903 Segmental and somatic dysfunction of lumbar region: Secondary | ICD-10-CM | POA: Diagnosis not present

## 2015-11-12 DIAGNOSIS — M9904 Segmental and somatic dysfunction of sacral region: Secondary | ICD-10-CM | POA: Diagnosis not present

## 2015-11-12 DIAGNOSIS — M9905 Segmental and somatic dysfunction of pelvic region: Secondary | ICD-10-CM | POA: Diagnosis not present

## 2015-11-15 DIAGNOSIS — M9905 Segmental and somatic dysfunction of pelvic region: Secondary | ICD-10-CM | POA: Diagnosis not present

## 2015-11-15 DIAGNOSIS — M9903 Segmental and somatic dysfunction of lumbar region: Secondary | ICD-10-CM | POA: Diagnosis not present

## 2015-11-15 DIAGNOSIS — M5136 Other intervertebral disc degeneration, lumbar region: Secondary | ICD-10-CM | POA: Diagnosis not present

## 2015-11-15 DIAGNOSIS — M9904 Segmental and somatic dysfunction of sacral region: Secondary | ICD-10-CM | POA: Diagnosis not present

## 2015-11-18 DIAGNOSIS — L309 Dermatitis, unspecified: Secondary | ICD-10-CM | POA: Diagnosis not present

## 2015-11-19 DIAGNOSIS — M5136 Other intervertebral disc degeneration, lumbar region: Secondary | ICD-10-CM | POA: Diagnosis not present

## 2015-11-19 DIAGNOSIS — M9905 Segmental and somatic dysfunction of pelvic region: Secondary | ICD-10-CM | POA: Diagnosis not present

## 2015-11-19 DIAGNOSIS — M9904 Segmental and somatic dysfunction of sacral region: Secondary | ICD-10-CM | POA: Diagnosis not present

## 2015-11-19 DIAGNOSIS — M9903 Segmental and somatic dysfunction of lumbar region: Secondary | ICD-10-CM | POA: Diagnosis not present

## 2015-11-21 DIAGNOSIS — E119 Type 2 diabetes mellitus without complications: Secondary | ICD-10-CM | POA: Diagnosis not present

## 2015-11-21 DIAGNOSIS — G20 Parkinson's disease: Secondary | ICD-10-CM | POA: Diagnosis not present

## 2015-11-21 DIAGNOSIS — F329 Major depressive disorder, single episode, unspecified: Secondary | ICD-10-CM | POA: Diagnosis not present

## 2015-11-21 DIAGNOSIS — F419 Anxiety disorder, unspecified: Secondary | ICD-10-CM | POA: Diagnosis not present

## 2015-11-21 DIAGNOSIS — C8590 Non-Hodgkin lymphoma, unspecified, unspecified site: Secondary | ICD-10-CM | POA: Diagnosis not present

## 2015-11-21 DIAGNOSIS — F0280 Dementia in other diseases classified elsewhere without behavioral disturbance: Secondary | ICD-10-CM | POA: Diagnosis not present

## 2015-11-22 DIAGNOSIS — M9904 Segmental and somatic dysfunction of sacral region: Secondary | ICD-10-CM | POA: Diagnosis not present

## 2015-11-22 DIAGNOSIS — M5136 Other intervertebral disc degeneration, lumbar region: Secondary | ICD-10-CM | POA: Diagnosis not present

## 2015-11-22 DIAGNOSIS — M9905 Segmental and somatic dysfunction of pelvic region: Secondary | ICD-10-CM | POA: Diagnosis not present

## 2015-11-22 DIAGNOSIS — M9903 Segmental and somatic dysfunction of lumbar region: Secondary | ICD-10-CM | POA: Diagnosis not present

## 2015-11-26 DIAGNOSIS — M9904 Segmental and somatic dysfunction of sacral region: Secondary | ICD-10-CM | POA: Diagnosis not present

## 2015-11-26 DIAGNOSIS — M9903 Segmental and somatic dysfunction of lumbar region: Secondary | ICD-10-CM | POA: Diagnosis not present

## 2015-11-26 DIAGNOSIS — M5136 Other intervertebral disc degeneration, lumbar region: Secondary | ICD-10-CM | POA: Diagnosis not present

## 2015-11-26 DIAGNOSIS — M9905 Segmental and somatic dysfunction of pelvic region: Secondary | ICD-10-CM | POA: Diagnosis not present

## 2015-12-05 DIAGNOSIS — M9904 Segmental and somatic dysfunction of sacral region: Secondary | ICD-10-CM | POA: Diagnosis not present

## 2015-12-05 DIAGNOSIS — M5136 Other intervertebral disc degeneration, lumbar region: Secondary | ICD-10-CM | POA: Diagnosis not present

## 2015-12-05 DIAGNOSIS — M9903 Segmental and somatic dysfunction of lumbar region: Secondary | ICD-10-CM | POA: Diagnosis not present

## 2015-12-05 DIAGNOSIS — M9905 Segmental and somatic dysfunction of pelvic region: Secondary | ICD-10-CM | POA: Diagnosis not present

## 2015-12-12 ENCOUNTER — Telehealth: Payer: Self-pay | Admitting: Oncology

## 2015-12-12 DIAGNOSIS — M5136 Other intervertebral disc degeneration, lumbar region: Secondary | ICD-10-CM | POA: Diagnosis not present

## 2015-12-12 DIAGNOSIS — M9903 Segmental and somatic dysfunction of lumbar region: Secondary | ICD-10-CM | POA: Diagnosis not present

## 2015-12-12 DIAGNOSIS — M9904 Segmental and somatic dysfunction of sacral region: Secondary | ICD-10-CM | POA: Diagnosis not present

## 2015-12-12 DIAGNOSIS — M9905 Segmental and somatic dysfunction of pelvic region: Secondary | ICD-10-CM | POA: Diagnosis not present

## 2015-12-12 NOTE — Telephone Encounter (Signed)
Returned call and s.w. Pt and confirmed appt...the patient ok and aware

## 2015-12-16 ENCOUNTER — Other Ambulatory Visit (HOSPITAL_BASED_OUTPATIENT_CLINIC_OR_DEPARTMENT_OTHER): Payer: Medicare Other

## 2015-12-16 ENCOUNTER — Ambulatory Visit (HOSPITAL_BASED_OUTPATIENT_CLINIC_OR_DEPARTMENT_OTHER): Payer: Medicare Other | Admitting: Oncology

## 2015-12-16 ENCOUNTER — Telehealth: Payer: Self-pay | Admitting: Oncology

## 2015-12-16 VITALS — BP 151/77 | HR 79 | Temp 97.8°F | Resp 17 | Ht 63.5 in | Wt 122.0 lb

## 2015-12-16 DIAGNOSIS — C8590 Non-Hodgkin lymphoma, unspecified, unspecified site: Secondary | ICD-10-CM

## 2015-12-16 DIAGNOSIS — Z853 Personal history of malignant neoplasm of breast: Secondary | ICD-10-CM | POA: Diagnosis not present

## 2015-12-16 LAB — COMPREHENSIVE METABOLIC PANEL
ALBUMIN: 3.7 g/dL (ref 3.5–5.0)
ALK PHOS: 82 U/L (ref 40–150)
ALT: 9 U/L (ref 0–55)
ANION GAP: 8 meq/L (ref 3–11)
AST: 20 U/L (ref 5–34)
BUN: 20.3 mg/dL (ref 7.0–26.0)
CALCIUM: 9.5 mg/dL (ref 8.4–10.4)
CO2: 29 mEq/L (ref 22–29)
Chloride: 106 mEq/L (ref 98–109)
Creatinine: 1.1 mg/dL (ref 0.6–1.1)
EGFR: 45 mL/min/{1.73_m2} — AB (ref >90)
Glucose: 95 mg/dl (ref 70–140)
POTASSIUM: 4.5 meq/L (ref 3.5–5.1)
SODIUM: 142 meq/L (ref 136–145)
Total Bilirubin: 0.33 mg/dL (ref 0.20–1.20)
Total Protein: 6 g/dL — ABNORMAL LOW (ref 6.4–8.3)

## 2015-12-16 LAB — CBC WITH DIFFERENTIAL/PLATELET
BASO%: 0.4 % (ref 0.0–2.0)
BASOS ABS: 0 10*3/uL (ref 0.0–0.1)
EOS%: 1.7 % (ref 0.0–7.0)
Eosinophils Absolute: 0.1 10*3/uL (ref 0.0–0.5)
HEMATOCRIT: 33.6 % — AB (ref 34.8–46.6)
HEMOGLOBIN: 11.4 g/dL — AB (ref 11.6–15.9)
LYMPH#: 0.8 10*3/uL — AB (ref 0.9–3.3)
LYMPH%: 16.1 % (ref 14.0–49.7)
MCH: 30.2 pg (ref 25.1–34.0)
MCHC: 33.9 g/dL (ref 31.5–36.0)
MCV: 88.9 fL (ref 79.5–101.0)
MONO#: 0.3 10*3/uL (ref 0.1–0.9)
MONO%: 5.4 % (ref 0.0–14.0)
NEUT#: 3.7 10*3/uL (ref 1.5–6.5)
NEUT%: 76.4 % (ref 38.4–76.8)
Platelets: 155 10*3/uL (ref 145–400)
RBC: 3.78 10*6/uL (ref 3.70–5.45)
RDW: 12.7 % (ref 11.2–14.5)
WBC: 4.8 10*3/uL (ref 3.9–10.3)
nRBC: 0 % (ref 0–0)

## 2015-12-16 NOTE — Progress Notes (Signed)
  [facility] OFFICE PROGRESS NOTE   Diagnosis: Non-Hodgkin's lymphoma, breast cancer  INTERVAL HISTORY:   [name] returns as scheduled. She feels well. No new complaint. She completed Arimidex in February.  Objective:  Vital signs in last 24 hours:  Blood pressure 151/77, pulse 79, temperature 97.8 F (36.6 C), temperature source Oral, resp. rate 17, height 5' 3.5" (1.613 m), weight 122 lb (55.339 kg), SpO2 98 %.    HEENT: Neck without mass Lymphatics: No cervical, supraclavicular, axillary, or inguinal nodes Resp: Lungs clear bilaterally Cardio: Regular rate and rhythm GI: No hepatosplenomegaly, no mass Vascular: No leg edema  Skin: Changes of senile purpura at the dorsum of the hands Breast: Status post bilateral mastectomy, no evidence for chest wall tumor recurrence    Lab Results:  Lab Results  Component Value Date   WBC 4.8 12/16/2015   HGB 11.4* 12/16/2015   HCT 33.6* 12/16/2015   MCV 88.9 12/16/2015   PLT 155 12/16/2015   NEUTROABS 3.7 12/16/2015     Medications: I have reviewed the patient's current medications.  Assessment/Plan: 1. Non-Hodgkin lymphoma treated with fludarabine/rituximab, last in July of 2009.  1. Restaging CT 11/24/2010 revealed evidence of progressive lymphoma in the abdomen and pelvis.  2. Initiation of salvage therapy with bendamustine/rituximab 12/04/2010. She completed 3 cycles.  3. Restaging CT 03/03/2011 revealed stable retroperitoneal soft tissue and a marked decrease in the soft tissue thickening associated with dilated loops of small bowel. 2. History of ITP. Intermittent mild thrombocytopenia persists. 3. History of Herpes zoster-maintained on prophylactic acyclovir. She will decrease the acyclovir to once daily 4. Anemia secondary to non-Hodgkin lymphoma, chemotherapy, and a history of autoimmune hemolysis.  5. Hypertension. 6. Right-hand tremor/ataxia-followed by neurology, now taking Sinemet,  diagnosed with Parkinson's disease 7. Hearing loss-status post placement of an implanted hearing device by Dr. Cresenciano Lick. 8. Bilateral breast cancer, right breast cancer-grade 1, T1 N0, ER/PR positive, and HER2 negative. Left breast cancer, synchronous grade 2, T2 N0, ER/PR positive, and HER2 negative. She began adjuvant Arimidex on 09/15/2010,completed February 2017 9. Inflammatory changes of both lungs on a CT of the chest 11/24/2010-progressive on the restaging CT 03/03/2011, status post a bronchoscopy 03/17/2011 with no evidence of granulomata or tumor. 10. 11 mm spiculated lesion in the lingula on a CT 11/24/2010-less distinct on the CT 03/03/2011. 11. Upper respiratory infection while visiting her son in [place], June 2013, resolved after treatment with Levaquin/steroids 12. Upper respiratory infection,? Pneumonia summer 2014-chest x-ray 02/17/2013 with bilateral infiltrates 13. CT chest abdomen and pelvis on 11/29/2014 with no evidence of active lymphoma or metastatic breast cancer. Previously demonstrated retroperitoneal process appeared improved with probable residual fibrosis. No discrete adenopathy. Interval near complete resolution of patchy airspace opacities in both lungs.    Disposition:  [name] remains in clinical remission from non-Hodgkin's lymphoma and breast cancer. She will return for an office visit in 4 months.  [name] Coder, MD  12/16/2015  11:44 AM

## 2015-12-16 NOTE — Telephone Encounter (Signed)
Gave and printed appt sched and avs for pt for Sept °

## 2015-12-17 DIAGNOSIS — M5136 Other intervertebral disc degeneration, lumbar region: Secondary | ICD-10-CM | POA: Diagnosis not present

## 2015-12-17 DIAGNOSIS — M9904 Segmental and somatic dysfunction of sacral region: Secondary | ICD-10-CM | POA: Diagnosis not present

## 2015-12-17 DIAGNOSIS — M9903 Segmental and somatic dysfunction of lumbar region: Secondary | ICD-10-CM | POA: Diagnosis not present

## 2015-12-17 DIAGNOSIS — M9905 Segmental and somatic dysfunction of pelvic region: Secondary | ICD-10-CM | POA: Diagnosis not present

## 2015-12-26 DIAGNOSIS — M5136 Other intervertebral disc degeneration, lumbar region: Secondary | ICD-10-CM | POA: Diagnosis not present

## 2015-12-26 DIAGNOSIS — M9904 Segmental and somatic dysfunction of sacral region: Secondary | ICD-10-CM | POA: Diagnosis not present

## 2015-12-26 DIAGNOSIS — M9903 Segmental and somatic dysfunction of lumbar region: Secondary | ICD-10-CM | POA: Diagnosis not present

## 2015-12-26 DIAGNOSIS — M9905 Segmental and somatic dysfunction of pelvic region: Secondary | ICD-10-CM | POA: Diagnosis not present

## 2015-12-30 DIAGNOSIS — L219 Seborrheic dermatitis, unspecified: Secondary | ICD-10-CM | POA: Diagnosis not present

## 2015-12-30 DIAGNOSIS — L821 Other seborrheic keratosis: Secondary | ICD-10-CM | POA: Diagnosis not present

## 2015-12-31 DIAGNOSIS — M9904 Segmental and somatic dysfunction of sacral region: Secondary | ICD-10-CM | POA: Diagnosis not present

## 2015-12-31 DIAGNOSIS — M9905 Segmental and somatic dysfunction of pelvic region: Secondary | ICD-10-CM | POA: Diagnosis not present

## 2015-12-31 DIAGNOSIS — M5136 Other intervertebral disc degeneration, lumbar region: Secondary | ICD-10-CM | POA: Diagnosis not present

## 2015-12-31 DIAGNOSIS — M9903 Segmental and somatic dysfunction of lumbar region: Secondary | ICD-10-CM | POA: Diagnosis not present

## 2016-01-06 DIAGNOSIS — G20 Parkinson's disease: Secondary | ICD-10-CM | POA: Diagnosis not present

## 2016-01-06 DIAGNOSIS — K219 Gastro-esophageal reflux disease without esophagitis: Secondary | ICD-10-CM | POA: Diagnosis not present

## 2016-01-06 DIAGNOSIS — E785 Hyperlipidemia, unspecified: Secondary | ICD-10-CM | POA: Diagnosis not present

## 2016-01-06 DIAGNOSIS — R011 Cardiac murmur, unspecified: Secondary | ICD-10-CM | POA: Diagnosis not present

## 2016-01-06 DIAGNOSIS — F419 Anxiety disorder, unspecified: Secondary | ICD-10-CM | POA: Diagnosis not present

## 2016-01-07 DIAGNOSIS — M5136 Other intervertebral disc degeneration, lumbar region: Secondary | ICD-10-CM | POA: Diagnosis not present

## 2016-01-07 DIAGNOSIS — M9905 Segmental and somatic dysfunction of pelvic region: Secondary | ICD-10-CM | POA: Diagnosis not present

## 2016-01-07 DIAGNOSIS — M9904 Segmental and somatic dysfunction of sacral region: Secondary | ICD-10-CM | POA: Diagnosis not present

## 2016-01-07 DIAGNOSIS — M9903 Segmental and somatic dysfunction of lumbar region: Secondary | ICD-10-CM | POA: Diagnosis not present

## 2016-01-09 ENCOUNTER — Ambulatory Visit (INDEPENDENT_AMBULATORY_CARE_PROVIDER_SITE_OTHER): Payer: Medicare Other | Admitting: Neurology

## 2016-01-09 ENCOUNTER — Encounter: Payer: Self-pay | Admitting: Neurology

## 2016-01-09 VITALS — BP 160/86 | HR 80 | Ht 63.5 in | Wt 122.0 lb

## 2016-01-09 DIAGNOSIS — F0280 Dementia in other diseases classified elsewhere without behavioral disturbance: Secondary | ICD-10-CM | POA: Diagnosis not present

## 2016-01-09 DIAGNOSIS — G20 Parkinson's disease: Secondary | ICD-10-CM | POA: Diagnosis not present

## 2016-01-09 DIAGNOSIS — F411 Generalized anxiety disorder: Secondary | ICD-10-CM

## 2016-01-09 NOTE — Addendum Note (Signed)
Addended byAnnamaria Helling on: 01/09/2016 12:38 PM   Modules accepted: Orders

## 2016-01-09 NOTE — Progress Notes (Signed)
[name] was seen today in the movement disorders clinic for neurologic consultation at the request of Dr. Betsy Coder.    The consultation is for the evaluation of ataxia and tremor.  The patient has been a previous pt of Dr. Erling Cruz.  Dr. Erling Cruz dx the patient with parkinsonism and ET.  This is an 82 y/o female with a complex medical hx of both non Hodgkins lymphoma require chemotherapy and breast CA, s/p bilateral mastectomy.  She has a long hx of tremor.  I reviewed Dr. Bernardo Heater notes that were made available to me.  It appears that she presented with right hand resting tremor in 2009, without associated rigidity or bradykinesia.  There were no known medications causing the problem and it appears that she was initially dx with ET and later transitioned the dx to parkinsonism.  The patient reports no tremor in the L hand.  Her husband reports a little tremor in the R leg.  The patient reports that tremor is most significant with relaxing and she has no tremor if her hands are preoccupied.    She has never been on PD medication or been to PT until yesterday.  03/16/13 update:  The pt presents today with her husband.  She has not been seen since February, 2014 at which time I dx her with PD.  She was started on levodopa 25/100 three times per day.  She did not follow up sooner as she had bronchitis and pneumonia for the last 2 months and is just now recovering.  She did go to PT and liked it but would like to go back.  She is doing well on the levodopa.  Her husband rarely notice tremor any longer.  06/28/13 update:    The pt today presents for f/u with her husband who supplements the hx.  She is on carbidopa/levodopa 25/100 three times per day.  She is doing well but notices R arm tremor now that had gone away.  Rarely she will note tremor in the L hand which she had not noted previously.  She does have some cramping of the feet.  She is unsure if this is associated with wearing off.  She did have some  of this while in my office, and it was time for her to redose the carbidopa/levodopa.  She is now attending therapy, and is enjoying and finding benefit from it.  08/08/12 update:  The patient presents today with her husband, who supplements the history.  The patient has Parkinson's disease and is currently on carbidopa/levodopa 25/100, one tablet 3 times per day.  Last visit, she was describing some cramping in her feet and I was trying to figure out whether or not this was an "off" phenomenon.  After paying attention, the patient called and said that she thought it was and we added entacapone.  The cramping in the feet is better but she is now having a stabbing and shooting pain down the back of the leg and to the lateral side of the lower leg that stops at the ankle.  It has stopped her from exercising.  She sometimes has back pain.  This is fairly inconsistent.  The leg pain seems to start about 4 days after she finished rehabilitation, which did seem to help.  She is not sure if the pain is related to the rehabilitation, however.  10/03/13 update:  The patient presents today with her husband, who supplements the history.  The patient is currently on carbidopa/levodopa 25/100, one  tablet 3 times per day.  She is also on Comtan 200 mg, one tablet 3 times per day.  From a Parkinson's standpoint, she has been doing well.  No falls.  No hallucinations.  Her husband rarely sees tremor, and that is primarily when she is in bed at night and he will feel it rather than see it.  Last visit, she was complaining of back pain that was likely sciatica.  I ordered an EMG but they ultimately canceled that.  I tried her on Lyrica but that caused her to be excessively sleepy.  She went to the chiropractor and states that she is doing much better from that regard.  Unfortunately, she has had bronchitis and a urinary tract infection.  She has been on several antibiotics, which have caused her to be nauseated.  She has also had  dental work and had several of her teeth pulled.  Because of these things, she has had a decreased appetite and really has not been able to eat.  She had a temporary partial placed, but her appetite still hasn't been what it used to be, primarily because of the nausea from the antibiotic.  She has not been exercising because of being sick.  She is hoping to get back to that soon.  02/02/14 update:  The patient presents today with her husband, who supplements the history.  The patient is currently on carbidopa/levodopa 25/100, 1 tablet at 9 AM/1 PM and 2 tablets at 6 PM.   She is also on Comtan 200 mg, one tablet 3 times per day.   The discomfort in the foot is much better.  From a Parkinson's standpoint, she has been doing well.  No falls.  No hallucinations.  The patient was screened through our Parkinson's screening program on 01/16/2014 and was either at baseline or better than baseline at all therapies and is scheduled for re-screening in January.  She is not currently exercising.    06/05/14 update:  The patient presents today with her husband, who supplements the history.  The patient is currently on carbidopa/levodopa 25/100, 1 tablet at 9 AM/1 PM and 2 tablets at 6 PM.   She is also on Comtan 200 mg, one tablet 3 times per day.  No falls.  No lightheadedness or near syncope.  No hallucinations.   I reviewed records since last visit.  Weight loss has been a concern.  The dietician has talked with her about suggestions to help, and the patient states that she was told to take Carnation 3 times per day.  Pt states that the biggest issue that has caused weight loss has been persistent nausea, which has been going on for about 2 months.  They really do not think that it is related to the Parkinson's medications, because it did not start with changes in Parkinson's medications.  It actually seemed to start after a visit to a restaurant and then persisted.  She just feels nauseated, has lost taste for food and  has lost appetite.  She followed up with Dr. Kenton Kingfisher and was started on Zantac about 3 or 4 days ago and does feel better and her blood pressure medication was discontinued.  10/04/14 update:  The patient presents today with her husband, who supplements the history.  The patient is currently on carbidopa/levodopa 25/100, 1 tablet at 9 AM/1 PM and 2 tablets at 6 PM.   She is also on Comtan 200 mg, one tablet 3 times per day. We d/c her comtan  for a week to see if her GI upset changed at all and it did not so we restarted it since obviously that was not the etiology.  She continues to have GI upset in the AM.  She went to her PCP and she was given Zofran and states that it made it worse but she only took it once.  It is only the AM when she has nausea and she does better the rest of the day.    12/14/14 update:  The patient returns today, accompanied by her husband who supplements the history.  Records were reviewed since last visit.  She has a history of breast cancer and was having weight loss/anorexia and saw her oncologist.  She ended up having a CT chest, abdomen and pelvis and those were unremarkable for any new lesions.  She has lost about 13 pounds since March of last year.  She has been complaining about nausea the last several visits.  In fact, we've previously stopped her entacapone to see if that would help the nausea, but it did not.  She did not think that it was related at all to her levodopa.  She no longer has the nausea but in the AM she doesn't feel good.  She has difficulty describing what this means.  She remains on carbidopa/levodopa 25/100, 1 tablet at 9 AM/one tablet at 1 PM and 2 tablets at 6 PM.  She is still on entacapone, 200 mg 3 times a day.  They brought with them a list of blood pressures and while the SBP is generally high (170's-190's without med and 150's-180's with BP med) it was never over 200.  She states that she was off the BP med a few days and felt better but when she went  back on it she felt worse again.  She was told to go back on it because it her BP's were too high.  She is only on amlodipine 2.5 mg.    03/05/15 update:  The patient is following up today regarding her Parkinson's disease.  She is currently on carbidopa/levodopa 25/100, 1 tablet at 9 AM, 1 tablet at 1 PM and 2 tablets at 6 PM.  She takes entacapone with each of these dosages.  At last visit, she was very focused on her blood pressure and I asked her to follow-up with her primary care physician to see if she actually needed her blood pressure medication.  Today, the patient and her husband state that she was able to d/c her amlodipine and her BP has been well controlled. She states that the nausea is better.  She states that she gets up in the AM and takes her med and just feels jittery and anxious.  Her husband asks about trying to take a levodopa at bedtime to see if that helps.  When she takes her xanax, the anxiety does seem to get better.  They were thinking about asking PCP to see if she can go up on the medication.  Husband admits that balance sometimes off and cognition not great.  PT did not think that she needed walker per their hx.  She has gotten back to her exercise.   By 3-4 pm, pt feels well.    06/11/15 update:  The patient presents today for follow-up, accompanied by her husband who supplements the history.  She is on carbidopa/levodopa 25/100, 1 tablet at 9 AM, 1 tablet at 1 PM and 2 tablets at 6 PM.  She is on entacapone  200 mg 3 times a day and last visit we added carbidopa/levodopa 50/200 at night.  She didn't notice a big difference with that. Her husband isn't sure that she really gave it enough time to know.   Her biggest issue last visit was her anxiety and I told her to try an extra levodopa when she is anxious to see if that helps.  They tried that and it didn't help.    I did tell her that I did not want her taking daily Xanax.  Today, the patient states that the "only thing that helps  is xanax."   Unfortunately, she has not been exercising.  Her husband is undergoing tx daily for prostate CA and they are consumed with that.  Balance has been okay and her husband states that she is doing well in that regard.  No falls.  She is sleeping from 9pm until 5-6am. She is taking naps during the day.   09/11/15 update:  The patient follows up today, accompanied by her husband who supplements the history.  She is on carbidopa/levodopa 25/100, 1 tablet at 9 AM, 1 at 1 PM and 2 tablets at 6 PM.  I asked her to retry carbidopa/levodopa 50/200 at night last visit.  She is still on the entacapone 200 mg 3 times per day.  Last visit, I encouraged her to discontinue her daily Xanax and we started her on Wellbutrin XL for her anxiety.  Her husband thinks that she is still on it (and off of xanax) but wellbutrin is not on her medication list today.  Anxiety has been better per both patient and husband.  Husband states that Dr. Kenton Kingfisher told them to ask me about sertraline.  She was going to come in and talk to me on January 11, but ended up having increased hallucinations and was admitted to the hospital from January 11 to 08/11/2015.  She was treated for urinary tract infection although her urine culture ended up being negative.  Husband reports that she is 90-95% better from a mental standpoint.  No hallucinations since d/c from the hospital.  Doing home PT/OT/ST and nurse comes once per week.    01/09/16 update:  The patient follows up today, accompanied by her husband who supplements the history.  She is on carbidopa/levodopa 25/100, 1 tablet at 9 AM, 1 at 1 PM and 2 tablets at 6 PM.  For some reason, her carbidopa/levodopa 50/200 got accidentally stopped, and I told them to just go ahead and hold that for now.  She is still on the entacapone 200 mg 3 times per day.  Last visit, her husband was not sure if she was still on the Wellbutrin and ended up calling me back.  This was stopped during her hospitalization.   She, therefore, started on Zoloft instead right after our last visit.  She took a single 25 mg tablet and felt nauseated, but I reminded her that she feels nauseated a lot in the morning and asked her to retry it, telling her that she could take it before bedtime and then work up to 50 mg at night.  She is only on 25 mg and husband states that she is doing well and anxiety is better.  Only time an issue is that she hates to get in a car and travel.    Nausea is also better.    She was referred last visit to cardiology for her heart murmur.  She saw the cardiologist on 09/02/2015 and has  an echocardiogram pending.    Neuroimaging has  previously been performed.  An MRI brain was last done in 2010 but pt can no longer have MRI's because of an implantable hearing device.    PREVIOUS MEDICATIONS: none to date  ALLERGIES:  No Known Allergies  CURRENT MEDICATIONS:  Current Outpatient Prescriptions on File Prior to Visit  Medication Sig Dispense Refill  . acetaminophen (TYLENOL) 325 MG tablet Take 650 mg by mouth every 6 (six) hours as needed for moderate pain.     [name] acyclovir (ZOVIRAX) 400 MG tablet TAKE 1 TABLET BY MOUTH TWICE DAILY 60 tablet 11  . bisacodyl (DULCOLAX) 5 MG EC tablet Take 5 mg by mouth daily as needed for moderate constipation.    [name] CALCIUM PO Take 600 mg by mouth 2 (two) times daily.    . carbidopa-levodopa (SINEMET IR) 25-100 MG tablet 1 tablet in the morning by mouth, 1 in the afternoon by mouth, 2 in the evening by mouth.    . cetirizine (ZYRTEC) 10 MG tablet Take 10 mg by mouth daily.    . cholecalciferol (VITAMIN D) 1000 units tablet Take 1,000 Units by mouth daily.    . clobetasol (OLUX) 0.05 % topical foam Scalp  2  . entacapone (COMTAN) 200 MG tablet TAKE 1 TABLET (200 MG TOTAL) BY MOUTH 3 (THREE) TIMES DAILY. 270 tablet 3  . Fluticasone-Salmeterol (ADVAIR DISKUS) 100-50 MCG/DOSE AEPB Inhale 1 puff into the lungs every 12 (twelve) hours.     . Multiple Vitamins-Minerals  (MULTIVITAMIN WITH MINERALS) tablet Take 1 tablet by mouth daily.      . ranitidine (ZANTAC) 300 MG tablet Take 300 mg by mouth at bedtime.     . sertraline (ZOLOFT) 25 MG tablet Take one tablet daily for one week, then take two tablets once daily 60 tablet 2   No current facility-administered medications on file prior to visit.    PAST MEDICAL HISTORY:   Past Medical History  Diagnosis Date  . Asthma   . Hearing loss   . Diabetes mellitus without complication ([place])     [id number].Marland KitchenMarland Kitchenpt denies  . GERD (gastroesophageal reflux disease)   . Depression   . Anxiety   . Parkinson disease ([place])   . NHL (non-Hodgkin's lymphoma) ([place])     nhl dx 9/04 breast ca dx1/12    PAST SURGICAL HISTORY:   Past Surgical History  Procedure Laterality Date  . Mastectomy  2 /8/ 12    bilateral  . Ba-ha ear implant    . Mastectomy      SOCIAL HISTORY:   Social History   Social History  . Marital Status: Married    Spouse Name: N/A  . Number of Children: N/A  . Years of Education: N/A   Occupational History  . retired     Pharmacist, hospital   Social History Main Topics  . Smoking status: Never Smoker   . Smokeless tobacco: Never Used     Comment: husband wsa smoker  . Alcohol Use: No     Comment: no  . Drug Use: No  . Sexual Activity: Not on file   Other Topics Concern  . Not on file   Social History Narrative    FAMILY HISTORY:   Family Status  Relation Status Death Age  . Father Deceased     CA, lung  . Mother Deceased     CA; complications of splenectomy  . Brother Deceased     3, renal failure, DM-2; CAD;  CA  . Child Alive     5 (4 biological), one with PKU    ROS:  A complete 10 system review of systems was obtained and was unremarkable apart from what is mentioned above.  PHYSICAL EXAMINATION:    VITALS:   Filed Vitals:   01/09/16 1114  BP: 160/86  Pulse: 80  Height: 5' 3.5" (1.613 m)  Weight: 122 lb (55.339 kg)   Wt Readings from Last 3 Encounters:  01/09/16  122 lb (55.339 kg)  12/16/15 122 lb (55.339 kg)  09/16/15 114 lb 9.6 oz (51.982 kg)     GEN:  The patient appears stated age and is in NAD. CV:  RRR with 3/6 SEM Lungs:  CTAB Neck:  No bruits but cardiac murmur radiates to the bilateral carotids   Neurological examination:  Orientation: She is alert and oriented to person and place.  She is repetitive and asks the same question over and over. Montreal Cognitive Assessment  09/11/2015 12/14/2014  Visuospatial/ Executive (0/5) 3 3  Naming (0/3) 3 3  Attention: Read list of digits (0/2) 1 2  Attention: Read list of letters (0/1) 1 1  Attention: Serial 7 subtraction starting at 100 (0/3) 0 0  Language: Repeat phrase (0/2) 0 1  Language : Fluency (0/1) 1 1  Abstraction (0/2) 1 1  Delayed Recall (0/5) 0 0  Orientation (0/6) 6 6  Total 16 18  Adjusted Score (based on education) 17 19   Cranial nerves: There is good facial symmetry.  The visual fields are full to confrontational testing. The speech is fluent and clear.  She is hypophonic.  Soft palate rises symmetrically and there is no tongue deviation. Hearing is intact to conversational tone. Sensation: Sensation is intact to light outh throughout. Motor: Strength is 5/5 in the bilateral upper and lower extremities.   Shoulder shrug is equal and symmetric.  There is no pronator drift.   Movement examination: Tone: There is no rigidity. Abnormal movements: There is rare tremor in both UE that is low amplitude Coordination:  There is no decremation noted today Gait and Station: The patient no has trouble arising out of the chair without use of her hands today.  Stride length and arm swing are good. Labs:  Lab Results  Component Value Date   WBC 4.8 12/16/2015   HGB 11.4* 12/16/2015   HCT 33.6* 12/16/2015   MCV 88.9 12/16/2015   PLT 155 12/16/2015     Chemistry      Component Value Date/Time   NA 142 12/16/2015 1115   NA 144 08/11/2015 0456   K 4.5 12/16/2015 1115   K 3.6  08/11/2015 0456   CL 111 08/11/2015 0456   CL 106 12/26/2012 1110   CO2 29 12/16/2015 1115   CO2 26 08/11/2015 0456   BUN 20.3 12/16/2015 1115   BUN 14 08/11/2015 0456   CREATININE 1.1 12/16/2015 1115   CREATININE 0.86 08/11/2015 0456      Component Value Date/Time   CALCIUM 9.5 12/16/2015 1115   CALCIUM 7.9* 08/11/2015 0456   CALCIUM 11.1* 07/15/2006 1212   ALKPHOS 82 12/16/2015 1115   ALKPHOS 88 02/07/2015 1428   AST 20 12/16/2015 1115   AST 40 02/07/2015 1428   ALT 9 12/16/2015 1115   ALT 8* 02/07/2015 1428   BILITOT 0.33 12/16/2015 1115   BILITOT 1.1 02/07/2015 1428      Lab Results  Component Value Date   VITAMINB12 1053* 06/10/2006   Lab Results  Component Value Date   TSH 2.93 09/19/2012    ASSESSMENT/PLAN:  1.  Idiopathic Parkinson's disease, dx 08/2012.  This is evidenced by bradykinesia, tremor, postural instability and rigidity.  There are no atypical features.  -We discussed the diagnosis as well as pathophysiology of the disease.  We discussed treatment options as well as prognostic indicators.  Patient education was provided.  - she is currently on carbidopa/levodopa 25/100 one tablet at 9 AM/one tablet at 1 PM and 2 tablets at 6 PM and will continue this.  She needs to avoid protein when taking levodopa and discussed this.  Risks, benefits, side effects and alternative therapies were discussed.  The opportunity to ask questions was given and they were answered to the best of my ability.  The patient expressed understanding and willingness to follow the outlined treatment protocols.  -Pt is on entacapone tid and she will continue on this.    -was supposed to restart carbidopa/levodopa 50/200 q hs previously but didn't and decided to hold on that for now.  -interested in LSVT/voice therapy and will schedule 2.  Weight loss/AM nausea  -This has resolved and her weight has actually increased now that using ensure regularly.  Do not use ensure with levodopa (at  same time) 3.  Anxiety  -doing well on low dose sertraline 25 mg q hs.  Still having anxiety about travelling in car and okay with me if she goes up to 50mg .  They are going to ask prescribing physician.    4.  Cardiac murmur  -being followed yearly by cardiology for mod AS 5.  PDD  -dont want to add further meds right now.  Is free of hallucinations but if occur again, will consider nuplazid.  6. Follow up is anticipated in the next few months, sooner should new neurologic issues arise.  Much greater than 50% of this visit was spent in counseling and coordinating care.  Total face to face time:  35 min

## 2016-01-13 DIAGNOSIS — M9905 Segmental and somatic dysfunction of pelvic region: Secondary | ICD-10-CM | POA: Diagnosis not present

## 2016-01-13 DIAGNOSIS — M5136 Other intervertebral disc degeneration, lumbar region: Secondary | ICD-10-CM | POA: Diagnosis not present

## 2016-01-13 DIAGNOSIS — M9903 Segmental and somatic dysfunction of lumbar region: Secondary | ICD-10-CM | POA: Diagnosis not present

## 2016-01-13 DIAGNOSIS — M9904 Segmental and somatic dysfunction of sacral region: Secondary | ICD-10-CM | POA: Diagnosis not present

## 2016-01-14 ENCOUNTER — Other Ambulatory Visit: Payer: Self-pay | Admitting: Neurology

## 2016-01-14 MED ORDER — ENTACAPONE 200 MG PO TABS: ORAL_TABLET | ORAL | Status: DC

## 2016-01-14 MED ORDER — CARBIDOPA-LEVODOPA 25-100 MG PO TABS: ORAL_TABLET | ORAL | Status: DC

## 2016-01-14 NOTE — Addendum Note (Signed)
Addended byAnnamaria Helling on: 01/14/2016 08:04 AM   Modules accepted: Orders

## 2016-01-14 NOTE — Telephone Encounter (Addendum)
Comtan and Carbidopa Levodopa refill requested. Per last office note- patient to remain on medication. Refill approved and sent to patient's pharmacy.

## 2016-01-16 DIAGNOSIS — H908 Mixed conductive and sensorineural hearing loss, unspecified: Secondary | ICD-10-CM | POA: Diagnosis not present

## 2016-01-16 DIAGNOSIS — H95191 Other disorders following mastoidectomy, right ear: Secondary | ICD-10-CM | POA: Diagnosis not present

## 2016-01-21 DIAGNOSIS — M9904 Segmental and somatic dysfunction of sacral region: Secondary | ICD-10-CM | POA: Diagnosis not present

## 2016-01-21 DIAGNOSIS — M9903 Segmental and somatic dysfunction of lumbar region: Secondary | ICD-10-CM | POA: Diagnosis not present

## 2016-01-21 DIAGNOSIS — M5136 Other intervertebral disc degeneration, lumbar region: Secondary | ICD-10-CM | POA: Diagnosis not present

## 2016-01-21 DIAGNOSIS — M9905 Segmental and somatic dysfunction of pelvic region: Secondary | ICD-10-CM | POA: Diagnosis not present

## 2016-01-30 ENCOUNTER — Ambulatory Visit: Payer: Medicare Other | Attending: Neurology | Admitting: Speech Pathology

## 2016-01-30 DIAGNOSIS — R471 Dysarthria and anarthria: Secondary | ICD-10-CM | POA: Insufficient documentation

## 2016-01-30 NOTE — Therapy (Signed)
[facility] [address] [place], [name], [address] Phone: [phone]   Fax:  [phone]  Speech Language Pathology Evaluation  Patient Details  Name: [name] MRN: [id number] Date of Birth: 29-Jun-1928 Referring Provider: Dr. Wells Guiles Tat  Encounter Date: 01/30/2016      End of Session - 01/30/16 1230    Visit Number 1   Number of Visits 17   Date for SLP Re-Evaluation 03/26/16   SLP Start Time 0932   SLP Stop Time  1015   SLP Time Calculation (min) 43 min   Activity Tolerance Patient tolerated treatment well      Past Medical History  Diagnosis Date  . Asthma   . Hearing loss   . Diabetes mellitus without complication ([place])     [id number].Marland KitchenMarland Kitchenpt denies  . GERD (gastroesophageal reflux disease)   . Depression   . Anxiety   . Parkinson disease ([place])   . NHL (non-Hodgkin's lymphoma) ([place])     nhl dx 9/04 breast ca dx1/12    Past Surgical History  Procedure Laterality Date  . Mastectomy  2 /8/ 12    bilateral  . Ba-ha ear implant    . Mastectomy      There were no vitals filed for this visit.      Subjective Assessment - 01/30/16 0939    Subjective "He is asking me to repeat myself throughout the day"            SLP Evaluation [facility] - 01/30/16 0953    SLP Visit Information   SLP Received On 01/30/16   Onset Date 2011 dx with PT   Medical Diagnosis Parkinson's Disease   Subjective   Patient/Family Stated Goal To be able to talk louder as I should"   Prior Functional Status   Cognitive/Linguistic Baseline Baseline deficits   Baseline deficit details memory - needs reminder to take meds; repeatedly looks at her calendar   Type of Home House    Lives With Spouse   Available Support Family   Vocation Retired   Scientist, physiological Comprehension   Overall [place] within functional limits for tasks assessed   Reading Comprehension   Reading Status Within funtional limits    Verbal Expression   Overall Verbal Expression Impaired   Naming Impairment   Other Naming Comments Usual word finding episodes during eval, pt looking to spouse to complete her sentences   Oral Motor/Sensory Function   Overall Oral Motor/Sensory Function Appears within functional limits for tasks assessed   Motor Speech   Overall Motor Speech Impaired   Respiration Impaired   Level of Impairment Sentence   Phonation Low vocal intensity   Resonance Within functional limits   Articulation Within functional limitis   Intelligibility Intelligibility reduced   Word 75-100% accurate   Phrase 75-100% accurate   Sentence 75-100% accurate   Conversation 50-74% accurate   Effective Techniques Increased vocal intensity;Slow rate                         SLP Education - 01/30/16 1227    Education provided Yes   Education Details goals, loud /a/,  compensations for dysarthria, breath support for speech   Person(s) Educated Patient;Spouse   Methods Explanation;Demonstration;Verbal cues;Handout   Comprehension Verbalized understanding;Returned demonstration;Verbal cues required;Need further instruction          SLP Short Term Goals - 01/30/16 1521    SLP SHORT TERM GOAL #1  Title Pt will demonstrate sustained /a/ of 85dB with rare min A   Time 4   Period Weeks   Status New   SLP SHORT TERM GOAL #2   Title Pt will maintain average of 72dB during structured speech tasks with occassional min A over 3 sessions    Time 4   Period Weeks   Status New          SLP Long Term Goals - 01/30/16 1523    SLP LONG TERM GOAL #1   Title Maintain an average of 70dB during conversation over 12 minutes with rare min A over two sessions    Time 8   Period Weeks   Status New   SLP LONG TERM GOAL #2   Title Philis Nettle audilbly in a noisy environment for 10 minutes with rare min A (01/08/15)   Time 8   Period Weeks   Status New          Plan - 01/30/16 1231    Clinical  Impression Statement [name] is an 82 y.o. female with h/o of PD know to [place] from prior course of ST. Today she presents exacerbation of moderate hypokinetic dysarthria. Her spouse reports he has to ask her to repeat herself throughout the day due to low volume.   15 minutes of conversational speech was reduced today, at average 67dB (WNL= average 70-72dB) with range of 63 69 dB, when a sound level meter was placed 30 cm away from pt's mouth. Overall intelligibility for this listener in a quiet environment was approx 90%. Production of loud /a/ averaged  82dB (range of 75 to 88) and  Usual mod verbal cues and modeling needed for loudness.   Oral motor assessment revealed WFL lingual ROM and WFL lingual strength. Labial ROM was [facility] and strength was [facility]. Velar ROM appeared [facility].   Pt rated effort level at 9/10 for production of loud /a/ (10=maximal effort). In oral reading  tasks, pt was asked to use the same amount of effort as with loud /a/. Loudness average with this increased effort was 70dB (range of 68 to 72) with occasional mod A  for loudness. Pt would benefit from skilled ST in order to improve speech intelligibility and pt's QOL.    Speech Therapy Frequency 2x / week   Duration --  8 weeks   Potential to Achieve Goals Good   Potential Considerations Ability to learn/carryover information   Consulted and Agree with Plan of Care Patient;Family member/caregiver   Family Member Consulted spouse [name]      Patient will benefit from skilled therapeutic intervention in order to improve the following deficits and impairments:   Dysarthria and anarthria - Plan: SLP plan of care cert/re-cert      G-Codes - [id number]    Functional Assessment Tool Used NOMS   Functional Limitations Motor speech   Motor Speech Current Status [phone]) At least 20 percent but less than 40 percent impaired, limited or restricted   Motor Speech Goal Status [id number]) At least 1 percent but less than 20 percent  impaired, limited or restricted      Problem List Patient Active Problem List   Diagnosis Date Noted  . Murmur 09/02/2015  . Parkinson disease ([place]) 09/02/2015  . Bilateral carotid bruits 09/02/2015  . Confusion 08/07/2015  . Acute encephalopathy 08/07/2015  . UTI (lower urinary tract infection) 08/07/2015  . Acute kidney failure ([place]) 08/07/2015  . Paralysis agitans ([place]) 09/19/2012  . Lymphoma ([place]) 07/03/2011  .  Malignant neoplasm of female breast ([place]) 07/03/2011  . Abnormal CT of the chest 03/13/2011  . HYPERTENSION, BENIGN 01/01/2009  . EDEMA 01/01/2009    [place], Annye Rusk MS, CCC-SLP 01/30/2016, 3:28 PM  [place] [address] [place], [name], [address] Phone: [phone]   Fax:  [phone]  Name: Shyla Manninen MRN: [id number] Date of Birth: 10/18/1927

## 2016-01-30 NOTE — Patient Instructions (Signed)
   Loud "AH!" with big breath and effort of 9/10  5 times, twice a day  Read sentences with big breath before each sentence - think shout Take your time and feel effort to be loud on each sentence  Reduce background noise when you are talking - mute the TV, be aware of appliance noise - move into a quiet room  Tips for Talking with People who have Aphasia  . Say one thing at a time . Don't  rush - slow down, be patient . Reduce background noise . Relax - be natural . Use pen and paper . Write down key words . Draw diagrams or pictures . Don't pretend you understand . Ask what helps . Recap - check you both understand   Describing words  What group does it belong to?  What do I use it for?  Where can I find it?  What does it LOOK like?  What other words go with it?  What is the 1st sound of the word?  Many Ways to Communicate  Describe it Write it Draw it Gesture it Use related words   Provided by: Barnabas Lister [place], [phone]

## 2016-02-06 DIAGNOSIS — H01005 Unspecified blepharitis left lower eyelid: Secondary | ICD-10-CM | POA: Diagnosis not present

## 2016-02-06 DIAGNOSIS — H0015 Chalazion left lower eyelid: Secondary | ICD-10-CM | POA: Diagnosis not present

## 2016-02-06 DIAGNOSIS — M5136 Other intervertebral disc degeneration, lumbar region: Secondary | ICD-10-CM | POA: Diagnosis not present

## 2016-02-06 DIAGNOSIS — H01004 Unspecified blepharitis left upper eyelid: Secondary | ICD-10-CM | POA: Diagnosis not present

## 2016-02-07 ENCOUNTER — Ambulatory Visit: Payer: Medicare Other

## 2016-02-07 DIAGNOSIS — R471 Dysarthria and anarthria: Secondary | ICD-10-CM | POA: Diagnosis not present

## 2016-02-07 NOTE — Therapy (Signed)
[place] [address] [place], [name], [address] Phone: [phone]   Fax:  [phone]  Speech Language Pathology Treatment  Patient Details  Name: [name] MRN: [id number] Date of Birth: July 13, 1928 Referring Provider: Dr. Wells Guiles Tat  Encounter Date: 02/07/2016      End of Session - 02/07/16 1445    Visit Number 2   Number of Visits 17   Date for SLP Re-Evaluation 03/26/16   SLP Start Time [id number]   SLP Stop Time  1446   SLP Time Calculation (min) 39 min   Activity Tolerance Patient tolerated treatment well      Past Medical History  Diagnosis Date  . Asthma   . Hearing loss   . Diabetes mellitus without complication ([place])     [id number].Marland KitchenMarland Kitchenpt denies  . GERD (gastroesophageal reflux disease)   . Depression   . Anxiety   . Parkinson disease ([place])   . NHL (non-Hodgkin's lymphoma) ([place])     nhl dx 9/04 breast ca dx1/12    Past Surgical History  Procedure Laterality Date  . Mastectomy  2 /8/ 12    bilateral  . Ba-ha ear implant    . Mastectomy      There were no vitals filed for this visit.      Subjective Assessment - 02/07/16 1427    Subjective "We have so many doctors appointments - 3 and 4 a week now."               ADULT SLP TREATMENT - 02/07/16 1428    General Information   Behavior/Cognition Alert;Cooperative;Pleasant mood   Treatment Provided   Treatment provided Cognitive-Linquistic   Cognitive-Linquistic Treatment   Treatment focused on Dysarthria   Skilled Treatment SLP facilitated pt's WNL volume in conversation by having pt produce loud /a/ - average 88dB with rare min A for loudness. In sentence tasks pt maintained 70dB with occasional min-mod A for loudness.     Assessment / Recommendations / Plan   Plan Continue with current plan of care   Progression Toward Goals   Progression toward goals Progressing toward goals            SLP Short Term Goals -  02/07/16 1429    SLP SHORT TERM GOAL #1   Title Pt will demonstrate sustained /a/ of 85dB with rare min A   Time 4   Period Weeks   Status On-going   SLP SHORT TERM GOAL #2   Title Pt will maintain average of 72dB during structured speech tasks with occassional min A over 3 sessions    Time 4   Period Weeks   Status On-going          SLP Long Term Goals - 02/07/16 1429    SLP LONG TERM GOAL #1   Title Maintain an average of 70dB during conversation over 12 minutes with rare min A over two sessions    Time 8   Period Weeks   Status On-going   SLP LONG TERM GOAL #2   Title Philis Nettle audilbly in a noisy environment for 10 minutes with rare min A (01/08/15)   Time 8   Period Weeks   Status On-going          Plan - 02/07/16 1445    Clinical Impression Statement Today pt had good success with loud /a/, and states she has been practicing at home. Sentence tasks were completed with necessary SLP cues. Pt with awarenss of when  her loudness decr'd. Skilled ST remains needed to maximize effective communication at the conversational level.   Speech Therapy Frequency 2x / week   Duration --  8 weeks   Treatment/Interventions SLP instruction and feedback;Compensatory strategies;Patient/family education;Functional tasks   Potential to Achieve Goals Good   Potential Considerations Ability to learn/carryover information   Consulted and Agree with Plan of Care Patient      Patient will benefit from skilled therapeutic intervention in order to improve the following deficits and impairments:   Dysarthria and anarthria    Problem List Patient Active Problem List   Diagnosis Date Noted  . Murmur 09/02/2015  . Parkinson disease ([place]) 09/02/2015  . Bilateral carotid bruits 09/02/2015  . Confusion 08/07/2015  . Acute encephalopathy 08/07/2015  . UTI (lower urinary tract infection) 08/07/2015  . Acute kidney failure ([place]) 08/07/2015  . Paralysis agitans ([place]) 09/19/2012  . Lymphoma  ([place]) 07/03/2011  . Malignant neoplasm of female breast ([place]) 07/03/2011  . Abnormal CT of the chest 03/13/2011  . HYPERTENSION, BENIGN 01/01/2009  . EDEMA 01/01/2009    [facility] ,MS, CCC-SLP  02/07/2016, 2:47 PM  [place] [address] [place], [name], [address] Phone: [phone]   Fax:  [phone]   Name: [name] MRN: [id number] Date of Birth: 08-Jul-1928

## 2016-02-11 ENCOUNTER — Encounter: Payer: Medicare Other | Admitting: Speech Pathology

## 2016-02-12 DIAGNOSIS — H01004 Unspecified blepharitis left upper eyelid: Secondary | ICD-10-CM | POA: Diagnosis not present

## 2016-02-12 DIAGNOSIS — H0015 Chalazion left lower eyelid: Secondary | ICD-10-CM | POA: Diagnosis not present

## 2016-02-12 DIAGNOSIS — H01005 Unspecified blepharitis left lower eyelid: Secondary | ICD-10-CM | POA: Diagnosis not present

## 2016-02-13 ENCOUNTER — Ambulatory Visit: Payer: Medicare Other | Admitting: Speech Pathology

## 2016-02-13 DIAGNOSIS — R471 Dysarthria and anarthria: Secondary | ICD-10-CM | POA: Diagnosis not present

## 2016-02-13 NOTE — Therapy (Signed)
[facility] [address] [place], [name], [address] Phone: [phone]   Fax:  [phone]  Speech Language Pathology Treatment  Patient Details  Name: [name] MRN: [id number] Date of Birth: 04/04/1928 Referring Provider: Dr. Wells Guiles Tat  Encounter Date: 02/13/2016      End of Session - 02/13/16 1452    Visit Number 3   Number of Visits 17   Date for SLP Re-Evaluation 03/26/16   SLP Start Time 1404   SLP Stop Time  1447   SLP Time Calculation (min) 43 min      Past Medical History  Diagnosis Date  . Asthma   . Hearing loss   . Diabetes mellitus without complication ([place])     [id number].Marland KitchenMarland Kitchenpt denies  . GERD (gastroesophageal reflux disease)   . Depression   . Anxiety   . Parkinson disease ([place])   . NHL (non-Hodgkin's lymphoma) ([place])     nhl dx 9/04 breast ca dx1/12    Past Surgical History  Procedure Laterality Date  . Mastectomy  2 /8/ 12    bilateral  . Ba-ha ear implant    . Mastectomy      There were no vitals filed for this visit.      Subjective Assessment - 02/13/16 1410    Subjective "My back is still a problem"               ADULT SLP TREATMENT - 02/13/16 1411    General Information   Behavior/Cognition Alert;Cooperative;Pleasant mood   Treatment Provided   Treatment provided Cognitive-Linquistic   Pain Assessment   Pain Assessment 0-10   Pain Score 10-Worst pain ever   Pain Descriptors / Indicators Aching;Constant   Pain Intervention(s) Monitored during session;Repositioned   Cognitive-Linquistic Treatment   Treatment focused on Dysarthria   Skilled Treatment "I think I have done OK with the "ah"  Loud /a/ practiced to recalibrate loudness - initially with average of 84dB - after modeling and instruction  this increased to average of 88dB over 3 attempts. Structured speech tasks average 70dB with occasional min verbal and visual cues - higher cognitve load resulted  in reduced volume. Sentence level responses via descriptions average of 70dB with min visual and verbal cues for loud             SLP Short Term Goals - 02/13/16 1452    SLP SHORT TERM GOAL #1   Title Pt will demonstrate sustained /a/ of 85dB with rare min A   Time 3   Period Weeks   Status On-going   SLP SHORT TERM GOAL #2   Title Pt will maintain average of 72dB during structured speech tasks with occassional min A over 3 sessions    Time 3   Period Weeks   Status On-going          SLP Long Term Goals - 02/13/16 1452    SLP LONG TERM GOAL #1   Title Maintain an average of 70dB during conversation over 12 minutes with rare min A over two sessions    Time 7   Period Weeks   Status On-going   SLP LONG TERM GOAL #2   Title Philis Nettle audilbly in a noisy environment for 10 minutes with rare min A (01/08/15)   Time 7   Period Weeks   Status On-going          Plan - 02/13/16 1451    Clinical Impression Statement Pt required somewhat more cueing  today for loudness, with hoarse voice. Mod A for carryover of loud volume to conversational speech. Continue skilled ST to maximize intellgibility   Speech Therapy Frequency 2x / week   Treatment/Interventions SLP instruction and feedback;Compensatory strategies;Patient/family education;Functional tasks   Potential to Achieve Goals Good   Potential Considerations Ability to learn/carryover information   Consulted and Agree with Plan of Care Patient      Patient will benefit from skilled therapeutic intervention in order to improve the following deficits and impairments:   Dysarthria and anarthria    Problem List Patient Active Problem List   Diagnosis Date Noted  . Murmur 09/02/2015  . Parkinson disease ([place]) 09/02/2015  . Bilateral carotid bruits 09/02/2015  . Confusion 08/07/2015  . Acute encephalopathy 08/07/2015  . UTI (lower urinary tract infection) 08/07/2015  . Acute kidney failure ([place]) 08/07/2015  . Paralysis  agitans ([place]) 09/19/2012  . Lymphoma ([place]) 07/03/2011  . Malignant neoplasm of female breast ([place]) 07/03/2011  . Abnormal CT of the chest 03/13/2011  . HYPERTENSION, BENIGN 01/01/2009  . EDEMA 01/01/2009    [place], Annye Rusk MS, CCC-SLP 02/13/2016, 2:53 PM  [facility] [address] [place], [name], [address] Phone: [phone]   Fax:  [phone]   Name: Mabry Henthorne MRN: [id number] Date of Birth: 07/09/1928

## 2016-02-18 ENCOUNTER — Ambulatory Visit: Payer: Medicare Other

## 2016-02-18 DIAGNOSIS — R471 Dysarthria and anarthria: Secondary | ICD-10-CM

## 2016-02-18 NOTE — Therapy (Signed)
[facility] [address] [place], [name], [address] Phone: [phone]   Fax:  [phone]  Speech Language Pathology Treatment  Patient Details  Name: [name] MRN: [id number] Date of Birth: [birth year] Referring Provider: Dr. Wells Guiles Tat  Encounter Date: 02/18/2016      End of Session - 02/18/16 1718    Visit Number 4   Number of Visits 17   Date for SLP Re-Evaluation 03/26/16   SLP Start Time 57   SLP Stop Time  1615   SLP Time Calculation (min) 41 min   Activity Tolerance Patient tolerated treatment well      Past Medical History:  Diagnosis Date  . Anxiety   . Asthma   . Depression   . Diabetes mellitus without complication ([place])    [id number].Marland KitchenMarland Kitchenpt denies  . GERD (gastroesophageal reflux disease)   . Hearing loss   . NHL (non-Hodgkin's lymphoma) ([place])    nhl dx 9/04 breast ca dx1/12  . Parkinson disease [facility])     Past Surgical History:  Procedure Laterality Date  . Ba-HA Ear implant    . MASTECTOMY  2 /8/ 12   bilateral  . MASTECTOMY      There were no vitals filed for this visit.      Subjective Assessment - 02/18/16 1537    Subjective "I didn't do so well with my homework yesterday."               ADULT SLP TREATMENT - 02/18/16 1537      General Information   Behavior/Cognition Alert;Cooperative;Pleasant mood     Treatment Provided   Treatment provided Cognitive-Linquistic     Pain Assessment   Pain Assessment 0-10   Pain Score 5    Pain Location back   Pain Descriptors / Indicators Aching;Constant   Pain Intervention(s) Monitored during session;Premedicated before session     Cognitive-Linquistic Treatment   Treatment focused on Dysarthria   Skilled Treatment SLP facilitated recalibration of pt's conversational loudness with loud /a/ - average 85dB. In simple linguistic tasks pt maintained loudness at 70dB 85-90% of the time. When linguistic complexity or  attentional need increased (pt had to replace a proper noun with a pronoun) loudness was average 68dB.      Assessment / Recommendations / Plan   Plan Continue with current plan of care     Progression Toward Goals   Progression toward goals Progressing toward goals            SLP Short Term Goals - 02/18/16 1721      SLP SHORT TERM GOAL #1   Title Pt will demonstrate sustained /a/ of 85dB with rare min A   Time 2   Period Weeks   Status On-going     SLP SHORT TERM GOAL #2   Title Pt will maintain average of 72dB during structured speech tasks with occassional min A over 3 sessions    Time 2   Period Weeks   Status On-going          SLP Long Term Goals - 02/18/16 1720      SLP LONG TERM GOAL #1   Title Maintain an average of 70dB during conversation over 12 minutes with rare min A over two sessions    Time 6   Period Weeks   Status On-going     SLP LONG TERM GOAL #2   Title Philis Nettle audilbly in a noisy environment for 10 minutes with rare min A (  01/08/15)   Time 6   Period Weeks   Status On-going          Plan - 02/18/16 1718    Clinical Impression Statement Pt required mod A for carryover of loud volume in more linguistically complex responses. Sub-optimal carryover into conversational speech. Continue skilled ST to maximize intellgibility   Speech Therapy Frequency 2x / week   Duration --  6 weeks   Treatment/Interventions SLP instruction and feedback;Compensatory strategies;Patient/family education;Functional tasks   Potential to Achieve Goals Good   Potential Considerations Ability to learn/carryover information   Consulted and Agree with Plan of Care Patient      Patient will benefit from skilled therapeutic intervention in order to improve the following deficits and impairments:   Dysarthria and anarthria    Problem List Patient Active Problem List   Diagnosis Date Noted  . Murmur 09/02/2015  . Parkinson disease ([place]) 09/02/2015  .  Bilateral carotid bruits 09/02/2015  . Confusion 08/07/2015  . Acute encephalopathy 08/07/2015  . UTI (lower urinary tract infection) 08/07/2015  . Acute kidney failure ([place]) 08/07/2015  . Paralysis agitans ([place]) 09/19/2012  . Lymphoma ([place]) 07/03/2011  . Malignant neoplasm of female breast ([place]) 07/03/2011  . Abnormal CT of the chest 03/13/2011  . HYPERTENSION, BENIGN 01/01/2009  . EDEMA 01/01/2009    [facility] ,MS, CCC-SLP  02/18/2016, 5:21 PM  [facility] [address] [place], [name], [address] Phone: [phone]   Fax:  [phone]   Name: [name] MRN: [id number] Date of Birth: 1927-09-03

## 2016-02-18 NOTE — Patient Instructions (Signed)
  Please complete the assigned speech therapy homework and return it to your next session.  

## 2016-02-20 ENCOUNTER — Ambulatory Visit: Payer: Medicare Other

## 2016-02-20 DIAGNOSIS — R471 Dysarthria and anarthria: Secondary | ICD-10-CM

## 2016-02-20 NOTE — Therapy (Signed)
[facility] [address] [place], [name], [address] Phone: [phone]   Fax:  [phone]  Speech Language Pathology Treatment  Patient Details  Name: [name] MRN: [id number] Date of Birth: 31-Oct-1927 Referring Provider: Dr. Wells Guiles Tat  Encounter Date: 02/20/2016      End of Session - 02/20/16 1613    Visit Number 5   Number of Visits 17   Date for SLP Re-Evaluation 03/26/16   SLP Start Time [id number]   SLP Stop Time  1400   SLP Time Calculation (min) 43 min   Activity Tolerance Patient tolerated treatment well      Past Medical History:  Diagnosis Date  . Anxiety   . Asthma   . Depression   . Diabetes mellitus without complication ([place])    [id number].Marland KitchenMarland Kitchenpt denies  . GERD (gastroesophageal reflux disease)   . Hearing loss   . NHL (non-Hodgkin's lymphoma) ([place])    nhl dx 9/04 breast ca dx1/12  . Parkinson disease [facility])     Past Surgical History:  Procedure Laterality Date  . Ba-HA Ear implant    . MASTECTOMY  2 /8/ 12   bilateral  . MASTECTOMY      There were no vitals filed for this visit.      Subjective Assessment - 02/20/16 1323    Subjective "I just skimmed the homework - we went to two doctors yesterday and I was beat."               ADULT SLP TREATMENT - 02/20/16 1324      General Information   Behavior/Cognition Alert;Cooperative;Pleasant mood     Treatment Provided   Treatment provided Cognitive-Linquistic     Pain Assessment   Pain Assessment 0-10   Pain Score 6    Pain Location rt back-hip  "It shifts on me"   Pain Descriptors / Indicators Constant   Pain Intervention(s) Monitored during session     Cognitive-Linquistic Treatment   Treatment focused on Dysarthria   Skilled Treatment SLP facilitated recalibration of pt's conversational loudness with loud /a/ - average 87dB. In simple linguistic tasks (sentence generation with two words) pt maintained loudness  of 70dB 90% of the time, with min-mod cues for immediate memory. In dual meanings task, pt req'd usual min A for loudness due to incr'd linguistic complexity and divided attention between language/message and need for increased loudness - pt averaged 68dB.      Assessment / Recommendations / Plan   Plan Continue with current plan of care     Progression Toward Goals   Progression toward goals Progressing toward goals            SLP Short Term Goals - 02/20/16 1615      SLP SHORT TERM GOAL #1   Title Pt will demonstrate sustained /a/ of 85dB with rare min A over 3 sessions   Time 2   Period Weeks   Status On-going     SLP SHORT TERM GOAL #2   Title Pt will maintain average of 72dB during structured speech tasks with occassional min A over 3 sessions    Time 2   Period Weeks   Status On-going          SLP Long Term Goals - 02/20/16 1616      SLP LONG TERM GOAL #1   Title Maintain an average of 70dB during conversation over 12 minutes with rare min A over two sessions    Time  6   Period Weeks   Status On-going     SLP LONG TERM GOAL #2   Title Philis Nettle audilbly in a noisy environment for 10 minutes with rare min A (01/08/15)   Time 6   Period Weeks   Status On-going          Plan - 02/20/16 1614    Clinical Impression Statement Pt required more consistent SLP A for carryover of loud volume in more linguistically complex responses. Sub-optimal carryover into conversational speech. Continue skilled ST to maximize intellgibility   Speech Therapy Frequency 2x / week   Duration --  6 weeks   Treatment/Interventions SLP instruction and feedback;Compensatory strategies;Patient/family education;Functional tasks   Potential to Achieve Goals Good   Potential Considerations Ability to learn/carryover information   Consulted and Agree with Plan of Care Patient      Patient will benefit from skilled therapeutic intervention in order to improve the following deficits and  impairments:   Dysarthria and anarthria    Problem List Patient Active Problem List   Diagnosis Date Noted  . Murmur 09/02/2015  . Parkinson disease ([place]) 09/02/2015  . Bilateral carotid bruits 09/02/2015  . Confusion 08/07/2015  . Acute encephalopathy 08/07/2015  . UTI (lower urinary tract infection) 08/07/2015  . Acute kidney failure ([place]) 08/07/2015  . Paralysis agitans ([place]) 09/19/2012  . Lymphoma ([place]) 07/03/2011  . Malignant neoplasm of female breast ([place]) 07/03/2011  . Abnormal CT of the chest 03/13/2011  . HYPERTENSION, BENIGN 01/01/2009  . EDEMA 01/01/2009    [facility] ,MS, CCC-SLP  02/20/2016, 4:17 PM  [facility] [address] [place], [name], [address] Phone: [phone]   Fax:  [phone]   Name: [name] MRN: [id number] Date of Birth: 1928-01-04

## 2016-02-20 NOTE — Patient Instructions (Signed)
  Please complete the assigned speech therapy homework - 20 minutes per day, at least.

## 2016-02-26 ENCOUNTER — Ambulatory Visit: Payer: Medicare Other | Admitting: Physical Therapy

## 2016-02-26 ENCOUNTER — Encounter: Payer: Self-pay | Admitting: Physical Therapy

## 2016-02-26 ENCOUNTER — Ambulatory Visit: Payer: Medicare Other | Attending: Physical Medicine and Rehabilitation

## 2016-02-26 DIAGNOSIS — R2689 Other abnormalities of gait and mobility: Secondary | ICD-10-CM | POA: Diagnosis not present

## 2016-02-26 DIAGNOSIS — R293 Abnormal posture: Secondary | ICD-10-CM

## 2016-02-26 DIAGNOSIS — M6281 Muscle weakness (generalized): Secondary | ICD-10-CM | POA: Diagnosis not present

## 2016-02-26 DIAGNOSIS — M545 Low back pain, unspecified: Secondary | ICD-10-CM

## 2016-02-26 DIAGNOSIS — R471 Dysarthria and anarthria: Secondary | ICD-10-CM | POA: Insufficient documentation

## 2016-02-26 DIAGNOSIS — R2681 Unsteadiness on feet: Secondary | ICD-10-CM | POA: Diagnosis not present

## 2016-02-26 NOTE — Patient Instructions (Signed)
  Please complete the assigned speech therapy homework and return it to your next session.  

## 2016-02-26 NOTE — Therapy (Signed)
[name] [address] [place], [name], [address] Phone: [phone]   Fax:  [phone]  Speech Language Pathology Treatment  Patient Details  Name: [name] MRN: [id number] Date of Birth: 11-29-1927 Referring Provider: Dr. Wells Guiles Tat  Encounter Date: 02/26/2016      End of Session - 02/26/16 1405    Visit Number 6   Number of Visits 17   Date for SLP Re-Evaluation 03/26/16   SLP Start Time 55   SLP Stop Time  1401   SLP Time Calculation (min) 43 min   Activity Tolerance Patient tolerated treatment well      Past Medical History:  Diagnosis Date  . Anxiety   . Asthma   . Depression   . Diabetes mellitus without complication ([place])    [id number].Marland KitchenMarland Kitchenpt denies  . GERD (gastroesophageal reflux disease)   . Hearing loss   . NHL (non-Hodgkin's lymphoma) ([place])    nhl dx 9/04 breast ca dx1/12  . Parkinson disease [facility])     Past Surgical History:  Procedure Laterality Date  . Ba-HA Ear implant    . MASTECTOMY  2 /8/ 12   bilateral  . MASTECTOMY      There were no vitals filed for this visit.      Subjective Assessment - 02/26/16 1329    Subjective Pt arrived for PT eval for back pain at 12:30pm.               ADULT SLP TREATMENT - 02/26/16 1329      General Information   Behavior/Cognition Alert;Cooperative;Pleasant mood     Treatment Provided   Treatment provided Cognitive-Linquistic     Pain Assessment   Pain Assessment 0-10   Pain Score 3    Pain Location back   Pain Descriptors / Indicators Sore     Cognitive-Linquistic Treatment   Treatment focused on Dysarthria   Skilled Treatment SLP facilitated recalibration of pt's conversational loudness with loud /a/ - average 86dB. In simple linguistic tasks (sentence generation from sequence cards put into proper order) pt maintained average loudness of 70dB % of the time.. Pt averaged 68dB in simple conversation. In  semi-structured tasks (generating sentence from two words) pt req'd min-mod a for recalling both words, and occasional min-mod A for loudness ,likely due to dual tasking of message content and need for incr'd loudness.      Assessment / Recommendations / Plan   Plan Continue with current plan of care     Progression Toward Goals   Progression toward goals Progressing toward goals            SLP Short Term Goals - 02/26/16 1405      SLP SHORT TERM GOAL #1   Title Pt will demonstrate sustained /a/ of 85dB with rare min A over 3 sessions   Baseline two sessions 02-26-16   Time 1   Period Weeks   Status On-going     SLP SHORT TERM GOAL #2   Title Pt will maintain average of 72dB during structured speech tasks with occassional min A over 3 sessions    Time 1   Period Weeks   Status On-going          SLP Long Term Goals - 02/26/16 1406      SLP LONG TERM GOAL #1   Title Maintain an average of 70dB during conversation over 12 minutes with rare min A over two sessions    Time 5  Status On-going     SLP LONG TERM GOAL #2   Title Philis Nettle audilbly in a noisy environment for 10 minutes with rare min A (01/08/15)   Time 5   Period Weeks   Status On-going          Plan - 02/26/16 1405    Clinical Impression Statement Pt required more consistent SLP A for carryover of loud volume in more linguistically complex responses. Sub-optimal carryover persists into most conversational speech. Continue skilled ST to maximize intellgibility   Speech Therapy Frequency 2x / week   Duration --  5 weeks   Treatment/Interventions SLP instruction and feedback;Compensatory strategies;Patient/family education;Functional tasks   Potential to Achieve Goals Good   Potential Considerations Ability to learn/carryover information   Consulted and Agree with Plan of Care Patient      Patient will benefit from skilled therapeutic intervention in order to improve the following deficits and  impairments:   Dysarthria and anarthria    Problem List Patient Active Problem List   Diagnosis Date Noted  . Murmur 09/02/2015  . Parkinson disease ([place]) 09/02/2015  . Bilateral carotid bruits 09/02/2015  . Confusion 08/07/2015  . Acute encephalopathy 08/07/2015  . UTI (lower urinary tract infection) 08/07/2015  . Acute kidney failure ([place]) 08/07/2015  . Paralysis agitans ([place]) 09/19/2012  . Lymphoma ([place]) 07/03/2011  . Malignant neoplasm of female breast ([place]) 07/03/2011  . Abnormal CT of the chest 03/13/2011  . HYPERTENSION, BENIGN 01/01/2009  . EDEMA 01/01/2009    [facility] ,[place], CCC-SLP  02/26/2016, 2:06 PM  [facility] [address] [place], [name], [address] Phone: [phone]   Fax:  [phone]   Name: [name] MRN: [id number] Date of Birth: [birth year]

## 2016-02-27 NOTE — Therapy (Addendum)
[facility] [address] [place], [name], [address] Phone: [phone]   Fax:  [phone]  Physical Therapy Evaluation  Patient Details  Name: [name] MRN: [id number] Date of Birth: [birth year] Referring Provider: Suella Broad, MD [name], MD PCP)  Encounter Date: 02/26/2016      PT End of Session - 02/26/16 1320    Visit Number 1   Number of Visits 17   Date for PT Re-Evaluation 04/24/16   Authorization Type Medicare G-Code & progress note   PT Start Time 1236   PT Stop Time 1317   PT Time Calculation (min) 41 min   Equipment Utilized During Treatment Gait belt   Activity Tolerance Patient limited by pain   Behavior During Therapy WFL for tasks assessed/performed      Past Medical History:  Diagnosis Date  . Anxiety   . Asthma   . Depression   . Diabetes mellitus without complication ([place])    [id number].Marland KitchenMarland Kitchenpt denies  . GERD (gastroesophageal reflux disease)   . Hearing loss   . NHL (non-Hodgkin's lymphoma) ([place])    nhl dx 9/04 breast ca dx1/12  . Parkinson disease [facility])     Past Surgical History:  Procedure Laterality Date  . Ba-HA Ear implant    . MASTECTOMY  2 /8/ 12   bilateral  . MASTECTOMY      There were no vitals filed for this visit.       Subjective Assessment - 02/26/16 1241    Subjective This 82yo female presents for PT evaluation with onset of low back pain. She reports history of back pain but has not been an issue until a few months ago in May. She went to a chiropractor for 6 weeks and it helped for only short (~few hours) periods. She was referred by PCP to Dr. Nelva Bush who referred her to PT. Husband and patient report also increased balance & gait issues since back pain.    Patient is accompained by: Family member   Pertinent History Parkinson's Disease, Non-Hodgkins Lymphoma with chemo '09, Breast CA with bil. mastectomy, asthma   Limitations  Lifting;Standing;Walking   Patient Stated Goals To get rid of back pain   Currently in Pain? Yes   Pain Score 2   In last week, worst 9-10/10, best 0-1/10   Pain Location Back   Pain Orientation Lower;Right   Pain Descriptors / Indicators Sharp;Pins and needles   Pain Type Acute pain   Pain Radiating Towards right knee   Pain Onset More than a month ago   Pain Frequency Intermittent   Aggravating Factors  bending, laying on left side, standing too long   Pain Relieving Factors sitting with back support   Effect of Pain on Daily Activities limits ADLs standing   Multiple Pain Sites No            [facility] PT Assessment - 02/26/16 1230      Assessment   Medical Diagnosis Degenerative Disc lumbar pain   Referring Provider Suella Broad, MD  [name], MD PCP   Onset Date/Surgical Date 02/13/16  MD referral, pain started in May 2017   Hand Dominance Right     Precautions   Precautions Fall     Balance Screen   Has the patient fallen in the past 6 months No   Has the patient had a decrease in activity level because of a fear of falling?  No   Is the patient reluctant to leave  their home because of a fear of falling?  No     Home Environment   Living Environment Private residence   Living Arrangements Spouse/significant other   Type of [place] to enter   Entrance Stairs-Number of Steps 3 + 1   Entrance Stairs-Rails Right;Left;Can reach both   [place] One level   [place] - 4 wheels;Cane - single point     Prior Function   Level of Independence Independent     Posture/Postural Control   Posture/Postural Control Postural limitations   Postural Limitations Rounded Shoulders;Forward head;Increased thoracic kyphosis;Flexed trunk;Weight shift right  occipital 1.5" right shift to sacrum   Posture Comments thoracic rotation with right ribs prominant that left     Tone   Assessment Location Right Lower Extremity;Left Lower  Extremity     ROM / Strength   AROM / PROM / Strength AROM;Strength     AROM   Overall AROM  Deficits;Within functional limits for tasks performed   Overall AROM Comments BUEs & BLEs grossly WFL   AROM Assessment Site Lumbar   Lumbar Flexion WNL  no pain standing flexion   Lumbar Extension WNL   no pain standing extension   Lumbar - Right Side Bend ~10% AROM  pain on right side   Lumbar - Left Side Bend ~20% AROM  pain on right side   Lumbar - Right Rotation 75% AROM   no pain   Lumbar - Left Rotation 75% AROM   no pain     Strength   Overall Strength Deficits   Strength Assessment Site Hip;Knee;Ankle   Right/Left Hip Right;Left   Right Hip Flexion 4-/5   Right Hip Extension 3-/5  tested in standing with UE support   Right Hip ABduction 3-/5  tested in standing with UE support, pain w/ left sidelying   Left Hip Flexion 4-/5   Left Hip Extension 3-/5  tested in standing with UE support   Left Hip ABduction 3-/5   Right/Left Knee Right;Left   Right Knee Flexion 3/5  tested in standing with UE support   Right Knee Extension 4/5   Left Knee Flexion 3/5  tested in standing with UE support   Left Knee Extension 4/5   Right/Left Ankle Right;Left   Right Ankle Dorsiflexion 4/5   Left Ankle Dorsiflexion 4/5     Transfers   Transfers Sit to Stand;Stand to Sit   Sit to Stand 6: Modified independent (Device/Increase time);Without upper extremity assist;From chair/3-in-1  uses back of legs against chair to stabilize   Stand to Sit 5: Supervision;To chair/3-in-1;Without upper extremity assist  uses back of legs against chair to stabilize     Ambulation/Gait   Ambulation/Gait Yes   Ambulation/Gait Assistance 5: Supervision   Ambulation Distance (Feet) 200 Feet   Assistive device Straight cane   Gait Pattern Decreased arm swing - right;Decreased stride length;Decreased step length - right;Trunk flexed;Shuffle   Ambulation Surface Indoor;Level   Gait velocity 2.58ft/sec    on 11/07/2015 was 3.82ft/sec     Standardized Balance Assessment   Standardized Balance Assessment Berg Balance Test;Five Times Sit to Stand   Five times sit to stand comments  14.02     Berg Balance Test   Sit to Stand Able to stand  independently using hands   Standing Unsupported Able to stand safely 2 minutes   Sitting with Back Unsupported but Feet Supported on Floor or Stool Able to sit safely and securely  2 minutes   Stand to Sit Sits safely with minimal use of hands   Transfers Able to transfer safely, minor use of hands   Standing Unsupported with Eyes Closed Able to stand 10 seconds safely   Standing Ubsupported with Feet Together Able to place feet together independently and stand for 1 minute with supervision   From Standing, Reach Forward with Outstretched Arm Can reach forward >12 cm safely (5")   From Standing Position, Pick up Object from Floor Able to pick up shoe, needs supervision   From Standing Position, Turn to Look Behind Over each Shoulder Looks behind one side only/other side shows less weight shift   Turn 360 Degrees Able to turn 360 degrees safely but slowly   Standing Unsupported, Alternately Place Feet on Step/Stool Able to complete 4 steps without aid or supervision   Standing Unsupported, One Foot in Front Able to take small step independently and hold 30 seconds   Standing on One Leg Tries to lift leg/unable to hold 3 seconds but remains standing independently   Total Score 42     RLE Tone   RLE Tone Within Functional Limits     LLE Tone   LLE Tone Within Functional Limits                                   PT Short Term Goals - 02/26/16 2022      PT SHORT TERM GOAL #1   Title Patient reports appointment to assess spinal alignment possible changes with her physician. ([place] Date: 03/27/2016)   Time 4   Period Weeks   Status New     PT SHORT TERM GOAL #2   Title Patient demonstrates understanding of initial HEP. (Target  Date: 03/27/2016)   Time 4   Period Weeks   Status New     PT SHORT TERM GOAL #3   Title Patient ambulates 250' with single point cane scanning environment with no balance losses with supervision. (Target Date: 03/27/2016)   Time 4   Period Weeks   Status New     PT SHORT TERM GOAL #4   Title Patient negotiates ramps & curbs with single point cane with supervision. (Target Date: 03/27/2016)   Time 4   Period Weeks   Status New         PT Long Term Goals - 02/26/16 2017      PT LONG TERM GOAL #1   Title Patient will perform HEP / ongoing fitness plan for improved balance, posture strength & flexibility. (Target Date: 04/24/2016)   Time 8   Period Weeks   Status New     PT LONG TERM GOAL #2   Title Berg Balance >/= 50/56 to indicate lower fall risk. (Target Date: 04/24/2016)   Time 8   Period Weeks   Status New     PT LONG TERM GOAL #3   Title Patient ambulates 400' including grass, ramps, curbs outside modified independent for community mobility. (Target Date: 04/24/2016)   Time 8   Period Weeks   Status New     PT LONG TERM GOAL #4   Title Functional Gait Assessment >/= 20/30 to indicate lower fall risk. (Target Date: 04/24/2016)   Time 8   Period Weeks   Status New                Plan - 02/26/16 1307    Clinical  Impression Statement This 82yo female presents with complaint of back pain. Her kyphotic posture has increased with lateral rotatory shift (occipital is 1.5" right of sacrum in standing). Patient has no documented history of scoliosis and with neuromuscular condition would benefit from testing. Patient's balance and gait have declined since last screen in Parkinson's program with Berg Balance of 42/56, 5X sit to stand increase 2sec, and slower gait velocity. Patient has LE weakness greater at hips that are negatively impacting balance. Patient would benefit from PT for treatment of low back pain and balance.    Rehab Potential Good   PT Frequency 2x / week    PT Duration 8 weeks   PT Treatment/Interventions ADLs/Self Care Home Management;DME Instruction;Gait training;Stair training;Functional mobility training;Therapeutic activities;Therapeutic exercise;Neuromuscular re-education;Balance training;Patient/family education;Orthotic Fit/Training   PT Next Visit Plan HEP for hip strength & low back pain.    Recommended Other Services X-ray for scoliosis   Consulted and Agree with Plan of Care Patient;Family member/caregiver   Family Member Consulted husband      Patient will benefit from skilled therapeutic intervention in order to improve the following deficits and impairments:  Abnormal gait, Decreased activity tolerance, Decreased balance, Decreased endurance, Decreased strength, Postural dysfunction, Pain  Visit Diagnosis: Other abnormalities of gait and mobility  Midline low back pain without sciatica  Unsteadiness on feet  Muscle weakness (generalized)  Abnormal posture      G-Codes - 03/06/16 1552    Functional Assessment Tool Used Berg Balance 42/56   Functional Limitation Mobility: Walking and moving around   Mobility: Walking and Moving Around Current Status [phone]) At least 40 percent but less than 60 percent impaired, limited or restricted   Mobility: Walking and Moving Around Goal Status [phone]) At least 20 percent but less than 40 percent impaired, limited or restricted       Problem List Patient Active Problem List   Diagnosis Date Noted  . Murmur 09/02/2015  . Parkinson disease ([place]) 09/02/2015  . Bilateral carotid bruits 09/02/2015  . Confusion 08/07/2015  . Acute encephalopathy 08/07/2015  . UTI (lower urinary tract infection) 08/07/2015  . Acute kidney failure ([place]) 08/07/2015  . Paralysis agitans ([place]) 09/19/2012  . Lymphoma ([place]) 07/03/2011  . Malignant neoplasm of female breast ([place]) 07/03/2011  . Abnormal CT of the chest 03/13/2011  . HYPERTENSION, BENIGN 01/01/2009  . EDEMA 01/01/2009    Finnlee Silvernail  PT, DPT 02/27/2016, 3:54 PM  [facility] [address] [place], [name], [address] Phone: [phone]   Fax:  [phone]  Name: [name] MRN: [id number] Date of Birth: 1927-12-08

## 2016-03-02 ENCOUNTER — Ambulatory Visit: Payer: Medicare Other | Admitting: *Deleted

## 2016-03-02 DIAGNOSIS — R471 Dysarthria and anarthria: Secondary | ICD-10-CM

## 2016-03-02 DIAGNOSIS — R2681 Unsteadiness on feet: Secondary | ICD-10-CM | POA: Diagnosis not present

## 2016-03-02 DIAGNOSIS — R293 Abnormal posture: Secondary | ICD-10-CM | POA: Diagnosis not present

## 2016-03-02 DIAGNOSIS — M545 Low back pain: Secondary | ICD-10-CM | POA: Diagnosis not present

## 2016-03-02 DIAGNOSIS — M6281 Muscle weakness (generalized): Secondary | ICD-10-CM | POA: Diagnosis not present

## 2016-03-02 DIAGNOSIS — R2689 Other abnormalities of gait and mobility: Secondary | ICD-10-CM | POA: Diagnosis not present

## 2016-03-02 NOTE — Therapy (Signed)
[facility] [address] [place], [name], [address] Phone: [phone]   Fax:  [phone]  Speech Language Pathology Treatment  Patient Details  Name: [name] MRN: [id number] Date of Birth: [birth year] Referring Provider: Dr. Wells Guiles Tat  Encounter Date: 03/02/2016      End of Session - 03/02/16 1352    Visit Number 7   Number of Visits 17   Date for SLP Re-Evaluation 03/26/16   SLP Start Time 1300   SLP Stop Time  1345   SLP Time Calculation (min) 45 min   Activity Tolerance Patient tolerated treatment well  pt reported decrease in pain to 4/10 upon conclusion of the session      Past Medical History:  Diagnosis Date  . Anxiety   . Asthma   . Depression   . Diabetes mellitus without complication ([place])    [id number].Marland KitchenMarland Kitchenpt denies  . GERD (gastroesophageal reflux disease)   . Hearing loss   . NHL (non-Hodgkin's lymphoma) ([place])    nhl dx 9/04 breast ca dx1/12  . Parkinson disease [facility])     Past Surgical History:  Procedure Laterality Date  . Ba-HA Ear implant    . MASTECTOMY  2 /8/ 12   bilateral  . MASTECTOMY      There were no vitals filed for this visit.      Subjective Assessment - 03/02/16 1305    Subjective "I'm not having a good day."   Patient is accompained by: Family member   Currently in Pain? Yes   Pain Score 8    Pain Location Back   Pain Orientation Right;Lower               ADULT SLP TREATMENT - 03/02/16 0001      General Information   Behavior/Cognition Alert;Cooperative;Pleasant mood     Treatment Provided   Treatment provided Cognitive-Linquistic     Cognitive-Linquistic Treatment   Treatment focused on Dysarthria  aphasia   Skilled Treatment Pt able to demonstrate improved vocal quality without pitch breaks with sustained /a/ in the upper 70 dB range. Vocal quality diminished when volume exceeded 82 dBs this date. Pt demonstrated 100% acc with sentence  completion with vocal intensity in the mid-60's. Occasional word-finding difficulties noted in conversational speech, however with cues to utilize description, communicative intent was not sacrificed.       Assessment / Recommendations / Plan   Plan Continue with current plan of care     Progression Toward Goals   Progression toward goals Progressing toward goals            SLP Short Term Goals - 03/02/16 1355      SLP SHORT TERM GOAL #1   Title Pt will demonstrate sustained /a/ of 85dB with rare min A over 3 sessions   Baseline two sessions 02-26-16   Time 1   Period Weeks   Status On-going     SLP SHORT TERM GOAL #2   Title Pt will maintain average of 72dB during structured speech tasks with occassional min A over 3 sessions    Time 1   Period Weeks   Status On-going     SLP SHORT TERM GOAL #3   Title Pt will report using throat clear alternatives over 4 sessions and demonstrate less than 4 throat clears in a session with occassional min A (12/08/14)   Status Achieved          SLP Long Term Goals - 03/02/16 1356  SLP LONG TERM GOAL #1   Title Maintain an average of 70dB during conversation over 12 minutes with rare min A over two sessions    Time 5   Period Weeks     SLP LONG TERM GOAL #2   Title Philis Nettle audilbly in a noisy environment for 10 minutes with rare min A (01/08/15)   Time 5   Period Weeks   Status On-going          Plan - 03/02/16 1353    Clinical Impression Statement Pt reported that her performance today was hampered by her overall status, however perforamnce improved as the session progressed.   Speech Therapy Frequency 2x / week   Duration --  5 weeks   Treatment/Interventions SLP instruction and feedback;Compensatory strategies;Patient/family education;Functional tasks   Potential to Achieve Goals Good   Consulted and Agree with Plan of Care Patient      Patient will benefit from skilled therapeutic intervention in order to improve  the following deficits and impairments:   Dysarthria and anarthria    Problem List Patient Active Problem List   Diagnosis Date Noted  . Murmur 09/02/2015  . Parkinson disease ([place]) 09/02/2015  . Bilateral carotid bruits 09/02/2015  . Confusion 08/07/2015  . Acute encephalopathy 08/07/2015  . UTI (lower urinary tract infection) 08/07/2015  . Acute kidney failure ([place]) 08/07/2015  . Paralysis agitans ([place]) 09/19/2012  . Lymphoma ([place]) 07/03/2011  . Malignant neoplasm of female breast ([place]) 07/03/2011  . Abnormal CT of the chest 03/13/2011  . HYPERTENSION, BENIGN 01/01/2009  . EDEMA 01/01/2009    [place], CCC-SLP 03/02/2016, 1:58 PM  [facility] [address] [place], [name], [address] Phone: [phone]   Fax:  [phone]   Name: [name] MRN: [id number] Date of Birth: Feb 09, 1928

## 2016-03-04 ENCOUNTER — Ambulatory Visit: Payer: Medicare Other

## 2016-03-04 ENCOUNTER — Ambulatory Visit: Payer: Medicare Other | Admitting: Physical Therapy

## 2016-03-04 ENCOUNTER — Encounter: Payer: Self-pay | Admitting: Physical Therapy

## 2016-03-04 DIAGNOSIS — R2681 Unsteadiness on feet: Secondary | ICD-10-CM

## 2016-03-04 DIAGNOSIS — M545 Low back pain, unspecified: Secondary | ICD-10-CM

## 2016-03-04 DIAGNOSIS — R2689 Other abnormalities of gait and mobility: Secondary | ICD-10-CM

## 2016-03-04 DIAGNOSIS — R293 Abnormal posture: Secondary | ICD-10-CM | POA: Diagnosis not present

## 2016-03-04 DIAGNOSIS — R471 Dysarthria and anarthria: Secondary | ICD-10-CM | POA: Diagnosis not present

## 2016-03-04 DIAGNOSIS — M6281 Muscle weakness (generalized): Secondary | ICD-10-CM | POA: Diagnosis not present

## 2016-03-04 NOTE — Patient Instructions (Addendum)
Copyright  VHI. All rights reserved.  Getting Into / Out of Bed    Lower self to lie down on one side by raising legs and lowering head at the same time. Use arms to assist moving without twisting. Bend both knees to roll onto back if desired. To sit up, start from lying on side, and use same move-ments in reverse. Keep trunk aligned with legs.   Copyright  VHI. All rights reserved.  Sleeping on Back    Place pillow under knees. A pillow with cervical support and a roll around waist are also helpful.   Copyright  VHI. All rights reserved.  Side-Lying in Bed    To maintain positioning in midline, place pillows between knees and under head.   Copyright  VHI. All rights reserved.  Avoid Twisting    Avoid twisting or bending back. Pivot around using foot movements, and bend at knees if needed when reaching for articles.   Copyright  VHI. All rights reserved.  Posture - Sitting    Sit upright, head facing forward. Try using a roll to support lower back. Keep shoulders relaxed, and avoid rounded back. Keep hips level with knees. Avoid crossing legs for long periods.   Copyright  VHI. All rights reserved.  Posture - Standing    Good posture is important. Avoid slouching and forward head thrust. Maintain curve in low back and align ears over shoul- ders, hips over ankles.   Copyright  VHI. All rights reserved.

## 2016-03-04 NOTE — Patient Instructions (Signed)
With more detailed tasks, you may want to ask the speaker for clarification of spoken requests.

## 2016-03-04 NOTE — Therapy (Signed)
[facility] [address] [place], [name], [address] Phone: [phone]   Fax:  [phone]  Speech Language Pathology Treatment  Patient Details  Name: [name] MRN: [id number] Date of Birth: Mar 25, 1928 Referring Provider: Dr. Wells Guiles Tat  Encounter Date: 03/04/2016      End of Session - 03/04/16 1405    Visit Number 8   Number of Visits 17   Date for SLP Re-Evaluation 03/26/16   SLP Start Time 1   SLP Stop Time  1400   SLP Time Calculation (min) 42 min   Activity Tolerance Patient tolerated treatment well      Past Medical History:  Diagnosis Date  . Anxiety   . Asthma   . Depression   . Diabetes mellitus without complication ([place])    6/75/44.Marland KitchenMarland Kitchenpt denies  . GERD (gastroesophageal reflux disease)   . Hearing loss   . NHL (non-Hodgkin's lymphoma) ([place])    nhl dx 9/04 breast ca dx1/12  . Parkinson disease [facility])     Past Surgical History:  Procedure Laterality Date  . Ba-HA Ear implant    . MASTECTOMY  2 /8/ 12   bilateral  . MASTECTOMY      There were no vitals filed for this visit.      Subjective Assessment - 03/04/16 1326    Subjective Pt with pain of 8-9/10 on back, right hip area               ADULT SLP TREATMENT - 03/04/16 1326      General Information   Behavior/Cognition Alert;Cooperative     Treatment Provided   Treatment provided Cognitive-Linquistic     Cognitive-Linquistic Treatment   Treatment focused on Dysarthria;Cognition   Skilled Treatment (Cognitive skills: 72mnutes) Pt had difficulty answering questions re: calendar due to amount of visual detail. Pt req'd mod A occasionally for correct response. Limited emergent awareness. (speech tx): SLP facilitated pt's loud /a/ to better recalibrate her conversational loudness - average 86dB with sound level meter 30 cm away from pt's mouth. Pt with decr'd vocal quality at last 1 second of last 4 repetitions. Pt  maintained 70dB average with short answer responses. Pt with usual tangential conversation with these answers.      Assessment / Recommendations / Plan   Plan Continue with current plan of care     Progression Toward Goals   Progression toward goals Progressing toward goals            SLP Short Term Goals - 03/04/16 1410      SLP SHORT TERM GOAL #1   Title Pt will demonstrate sustained /a/ of 85dB with rare min A over 3 sessions   Baseline two sessions 02-26-16   Time 1   Period Weeks   Status Not Met     SLP SHORT TERM GOAL #2   Title Pt will maintain average of 72dB during structured speech tasks with occassional min A over 3 sessions    Time 1   Period Weeks   Status Not Met     SLP SHORT TERM GOAL #3   Title --   Status --          SLP Long Term Goals - 03/04/16 1413      SLP LONG TERM GOAL #1   Title Maintain an average of 70dB during conversation over 12 minutes with rare min A over two sessions    Time 4   Period Weeks   Status On-going  SLP LONG TERM GOAL #2   Title Philis Nettle audilbly in a noisy environment for 10 minutes with rare min A (01/08/15)   Time 4   Period Weeks   Status On-going          Plan - 03/04/16 1405    Clinical Impression Statement Pt with concern today over her imaging about her back, as well as her reduced understanding with more detailed language tasks or visual tasks. Pt with cont'd reduced volume in conversation, varying with pt attention level and linguistic complexity.   Speech Therapy Frequency 2x / week   Duration 4 weeks   Treatment/Interventions SLP instruction and feedback;Compensatory strategies;Patient/family education;Functional tasks   Potential to Achieve Goals Good   Potential Considerations Ability to learn/carryover information      Patient will benefit from skilled therapeutic intervention in order to improve the following deficits and impairments:   Dysarthria and anarthria    Problem List Patient  Active Problem List   Diagnosis Date Noted  . Murmur 09/02/2015  . Parkinson disease ([place]) 09/02/2015  . Bilateral carotid bruits 09/02/2015  . Confusion 08/07/2015  . Acute encephalopathy 08/07/2015  . UTI (lower urinary tract infection) 08/07/2015  . Acute kidney failure ([place]) 08/07/2015  . Paralysis agitans ([place]) 09/19/2012  . Lymphoma ([place]) 07/03/2011  . Malignant neoplasm of female breast ([place]) 07/03/2011  . Abnormal CT of the chest 03/13/2011  . HYPERTENSION, BENIGN 01/01/2009  . EDEMA 01/01/2009    [facility] ,MS, CCC-SLP  03/04/2016, 2:15 PM  [facility] [address] [place], [name], [address] Phone: [phone]   Fax:  [phone]   Name: [name] MRN: [id number] Date of Birth: 07-02-1928

## 2016-03-04 NOTE — Therapy (Signed)
[facility] [address] [place], [name], [address] Phone: [phone]   Fax:  [phone]  Physical Therapy Treatment  Patient Details  Name: [name] MRN: [id number] Date of Birth: 12/12/1927 Referring Provider: Suella Broad, MD [name], MD PCP)  Encounter Date: 03/04/2016      PT End of Session - 03/04/16 1459    Visit Number 2   Number of Visits 17   Date for PT Re-Evaluation 04/24/16   Authorization Type Medicare G-Code & progress note   PT Start Time 1448   PT Stop Time 1530   PT Time Calculation (min) 42 min   Equipment Utilized During Treatment Gait belt   Activity Tolerance Patient limited by pain   Behavior During Therapy [facility] for tasks assessed/performed      Past Medical History:  Diagnosis Date  . Anxiety   . Asthma   . Depression   . Diabetes mellitus without complication ([place])    [id number].Marland KitchenMarland Kitchenpt denies  . GERD (gastroesophageal reflux disease)   . Hearing loss   . NHL (non-Hodgkin's lymphoma) ([place])    nhl dx 9/04 breast ca dx1/12  . Parkinson disease [facility])     Past Surgical History:  Procedure Laterality Date  . Ba-HA Ear implant    . MASTECTOMY  2 /8/ 12   bilateral  . MASTECTOMY      There were no vitals filed for this visit.      Subjective Assessment - 03/04/16 1453    Subjective No new complaints except for still hurting. Both pt and spouse wanting to know about xrays/etc that were mentioned in evaluation with primary PT.   Patient is accompained by: Family member   Pertinent History Parkinson's Disease, Non-Hodgkins Lymphoma with chemo '09, Breast CA with bil. mastectomy, asthma   Limitations Lifting;Standing;Walking   Patient Stated Goals To get rid of back pain   Currently in Pain? Yes   Pain Location Back   Pain Orientation Right;Lower   Pain Type Acute pain   Pain Onset More than a month ago   Pain Frequency Intermittent   Aggravating Factors   increased activity, standing, bending, and lying on left side   Pain Relieving Factors sitting with back support     Treatment: Issued the following to HEP: began to use the basic back exercise packet for HEP. - posterior pelvic tilt, 5 sec hold x 10 reps - single knee to chest, 20 sec holds x 3 each leg - attempted double knee to chest stretch, increased pain with lowering of legs therefore did not include this one - lower trunk rotation left<>right, 20 sec holds x 3 each side   Issued the following education as well: Copyright  VHI. All rights reserved.  Getting Into / Out of Bed    Lower self to lie down on one side by raising legs and lowering head at the same time. Use arms to assist moving without twisting. Bend both knees to roll onto back if desired. To sit up, start from lying on side, and use same move-ments in reverse. Keep trunk aligned with legs.   Copyright  VHI. All rights reserved.  Sleeping on Back    Place pillow under knees. A pillow with cervical support and a roll around waist are also helpful.   Copyright  VHI. All rights reserved.  Side-Lying in Bed    To maintain positioning in midline, place pillows between knees and under head.   Copyright  VHI. All  rights reserved.  Avoid Twisting    Avoid twisting or bending back. Pivot around using foot movements, and bend at knees if needed when reaching for articles.   Copyright  VHI. All rights reserved.  Posture - Sitting    Sit upright, head facing forward. Try using a roll to support lower back. Keep shoulders relaxed, and avoid rounded back. Keep hips level with knees. Avoid crossing legs for long periods.   Copyright  VHI. All rights reserved.  Posture - Standing    Good posture is important. Avoid slouching and forward head thrust. Maintain curve in low back and align ears over shoul- ders, hips over ankles.   Copyright  VHI. All rights reserved.             PT  Education - 03/04/16 2101    Education provided Yes   Education Details HEP; log roll, sleeping positions and body positions to decrease lumbar strain   Person(s) Educated Patient;Spouse   Methods Explanation;Demonstration;Verbal cues;Handout   Comprehension Verbalized understanding;Returned demonstration;Verbal cues required;Need further instruction             Plan - 03/04/16 1512    Clinical Impression Statement Today's session addressed edcuation on sleeping posture/positions to decrease lumbar strain, logroll technique for decreased lumbar strain and remainder of session worked on establishing HEP to address core/lumbar strengthening and flexibilty. Pt and spouse receptive to education provided today. Pt is making steady progress toward goals.                                      Rehab Potential Good   PT Frequency 2x / week   PT Duration 8 weeks   PT Treatment/Interventions ADLs/Self Care Home Management;DME Instruction;Gait training;Stair training;Functional mobility training;Therapeutic activities;Therapeutic exercise;Neuromuscular re-education;Balance training;Patient/family education;Orthotic Fit/Training   PT Next Visit Plan continue to work on Basic Back Exercise program for HEP;education on body mechanics/ADLs as needed.   Consulted and Agree with Plan of Care Patient;Family member/caregiver   Family Member Consulted husband      Patient will benefit from skilled therapeutic intervention in order to improve the following deficits and impairments:  Abnormal gait, Decreased activity tolerance, Decreased balance, Decreased endurance, Decreased strength, Postural dysfunction, Pain  Visit Diagnosis: Midline low back pain without sciatica  Other abnormalities of gait and mobility  Unsteadiness on feet  Muscle weakness (generalized)  Abnormal posture     Problem List Patient Active Problem List   Diagnosis Date Noted  . Murmur 09/02/2015  . Parkinson disease ([place])  09/02/2015  . Bilateral carotid bruits 09/02/2015  . Confusion 08/07/2015  . Acute encephalopathy 08/07/2015  . UTI (lower urinary tract infection) 08/07/2015  . Acute kidney failure ([place]) 08/07/2015  . Paralysis agitans ([place]) 09/19/2012  . Lymphoma ([place]) 07/03/2011  . Malignant neoplasm of female breast ([place]) 07/03/2011  . Abnormal CT of the chest 03/13/2011  . HYPERTENSION, BENIGN 01/01/2009  . EDEMA 01/01/2009    Willow Ora, PTA, [facility] [address] [place], [place] [address] [phone] 03/05/16, 9:02 PM   Name: [name] MRN: [id number] Date of Birth: 01/02/1928

## 2016-03-06 ENCOUNTER — Ambulatory Visit: Payer: Medicare Other | Admitting: Physical Therapy

## 2016-03-06 DIAGNOSIS — R2689 Other abnormalities of gait and mobility: Secondary | ICD-10-CM

## 2016-03-06 DIAGNOSIS — M545 Low back pain, unspecified: Secondary | ICD-10-CM

## 2016-03-06 DIAGNOSIS — R471 Dysarthria and anarthria: Secondary | ICD-10-CM | POA: Diagnosis not present

## 2016-03-06 DIAGNOSIS — R293 Abnormal posture: Secondary | ICD-10-CM

## 2016-03-06 DIAGNOSIS — M6281 Muscle weakness (generalized): Secondary | ICD-10-CM

## 2016-03-06 DIAGNOSIS — R2681 Unsteadiness on feet: Secondary | ICD-10-CM | POA: Diagnosis not present

## 2016-03-09 ENCOUNTER — Encounter: Payer: Medicare Other | Admitting: Speech Pathology

## 2016-03-09 NOTE — Therapy (Signed)
[facility] [address] [place], [name], [address] Phone: [phone]   Fax:  [phone]  Physical Therapy Treatment  Patient Details  Name: [name] MRN: [id number] Date of Birth: 23-Nov-1927 Referring Provider: Suella Broad, MD [name], MD PCP)  Encounter Date: 03/06/2016     03/06/16 1630  PT Visits / Re-Eval  Visit Number 3  Number of Visits 17  Date for PT Re-Evaluation 04/24/16  Authorization  Authorization Type Medicare G-Code & progress note  PT Time Calculation  PT Start Time 1235  PT Stop Time 1315  PT Time Calculation (min) 40 min  PT - End of Session  Equipment Utilized During Treatment Gait belt  Activity Tolerance Patient limited by pain  Behavior During Therapy [facility] for tasks assessed/performed    Past Medical History:  Diagnosis Date  . Anxiety   . Asthma   . Depression   . Diabetes mellitus without complication ([place])    [id number].Marland KitchenMarland Kitchenpt denies  . GERD (gastroesophageal reflux disease)   . Hearing loss   . NHL (non-Hodgkin's lymphoma) ([place])    nhl dx 9/04 breast ca dx1/12  . Parkinson disease [facility])     Past Surgical History:  Procedure Laterality Date  . Ba-HA Ear implant    . MASTECTOMY  2 /8/ 12   bilateral  . MASTECTOMY      There were no vitals filed for this visit.     03/06/16 1239  Symptoms/Limitations  Subjective Back felt good after doing exercises this am, pain increased when she bent over at sink while brushing teeth.  Still achy in back  Pertinent History Parkinson's Disease, Non-Hodgkins Lymphoma with chemo '09, Breast CA with bil. mastectomy, asthma  Limitations Lifting;Standing;Walking  Patient Stated Goals To get rid of back pain  Pain Assessment  Currently in Pain? Yes  Pain Score 6  Pain Location Back  Pain Descriptors / Indicators Aching  Pain Type Acute pain  Pain Onset More than a month ago  Pain Frequency Intermittent  Aggravating  Factors  increased activity, standing, bending and lying  on left side  Pain Relieving Factors sitting with back supporty    Treatment: Exercises Reviewed all back exercises issued to date with handout and min cues on correct technique/ex form (posterior pelvic tilt, single knee to chest and lower trunk rotation).  New exercises Attempted bridging, pt reported increased pain even with abdominal bracing. Added red pball to under pt's legs: bridging performed without any reports of pain x 10 reps with 5 sec holds. Hamstring curls/double knee to chest x 10 reps with 5 sec holds, min assist for ex form/technique. No pain reported with these exercises. seated at edge of mat: hamstring stretch, 30 sec's x 3 each side. Added this one only to HEP.      03/06/16 1301  Plan  Clinical Impression Statement Today's session continued to work on core strengthening for HEP. Pt is making progress with reports of decreased pain after stretching/exercises.   Pt will benefit from skilled therapeutic intervention in order to improve on the following deficits Abnormal gait;Decreased activity tolerance;Decreased balance;Decreased endurance;Decreased strength;Postural dysfunction;Pain  Rehab Potential Good  PT Frequency 2x / week  PT Duration 8 weeks  PT Treatment/Interventions ADLs/Self Care Home Management;DME Instruction;Gait training;Stair training;Functional mobility training;Therapeutic activities;Therapeutic exercise;Neuromuscular re-education;Balance training;Patient/family education;Orthotic Fit/Training  PT Next Visit Plan continue to work on Basic Back Exercise program for HEP;education on body mechanics/ADLs as needed. Have primary PT set goals.  Consulted and Agree with  Plan of Care Patient;Family member/caregiver  Family Member Consulted husband     Patient will benefit from skilled therapeutic intervention in order to improve the following deficits and impairments:  Abnormal gait, Decreased  activity tolerance, Decreased balance, Decreased endurance, Decreased strength, Postural dysfunction, Pain  Visit Diagnosis: Midline low back pain without sciatica  Other abnormalities of gait and mobility  Unsteadiness on feet  Muscle weakness (generalized)  Abnormal posture     Problem List Patient Active Problem List   Diagnosis Date Noted  . Murmur 09/02/2015  . Parkinson disease ([place]) 09/02/2015  . Bilateral carotid bruits 09/02/2015  . Confusion 08/07/2015  . Acute encephalopathy 08/07/2015  . UTI (lower urinary tract infection) 08/07/2015  . Acute kidney failure ([place]) 08/07/2015  . Paralysis agitans ([place]) 09/19/2012  . Lymphoma ([place]) 07/03/2011  . Malignant neoplasm of female breast ([place]) 07/03/2011  . Abnormal CT of the chest 03/13/2011  . HYPERTENSION, BENIGN 01/01/2009  . EDEMA 01/01/2009   Willow Ora, PTA, [facility] [address] [place], [place] [address] [phone] 03/09/16, 12:33 PM   Name: [name] MRN: [id number] Date of Birth: 04/17/1928

## 2016-03-11 ENCOUNTER — Ambulatory Visit: Payer: Medicare Other

## 2016-03-11 ENCOUNTER — Ambulatory Visit: Payer: Medicare Other | Admitting: Physical Therapy

## 2016-03-11 ENCOUNTER — Encounter: Payer: Self-pay | Admitting: Physical Therapy

## 2016-03-11 DIAGNOSIS — R293 Abnormal posture: Secondary | ICD-10-CM | POA: Diagnosis not present

## 2016-03-11 DIAGNOSIS — R2681 Unsteadiness on feet: Secondary | ICD-10-CM | POA: Diagnosis not present

## 2016-03-11 DIAGNOSIS — M6281 Muscle weakness (generalized): Secondary | ICD-10-CM | POA: Diagnosis not present

## 2016-03-11 DIAGNOSIS — M545 Low back pain, unspecified: Secondary | ICD-10-CM

## 2016-03-11 DIAGNOSIS — R2689 Other abnormalities of gait and mobility: Secondary | ICD-10-CM

## 2016-03-11 DIAGNOSIS — R471 Dysarthria and anarthria: Secondary | ICD-10-CM

## 2016-03-11 NOTE — Patient Instructions (Signed)
  Please complete the assigned speech therapy homework and return it to your next session.  

## 2016-03-11 NOTE — Therapy (Signed)
[facility] [address] [place], [name], [address] Phone: [phone]   Fax:  [phone]  Physical Therapy Treatment  Patient Details  Name: [name] MRN: [id number] Date of Birth: 1927-07-30 Referring Provider: Suella Broad, MD [name], MD PCP)  Encounter Date: 03/11/2016   03/11/16 1452  PT Visits / Re-Eval  Visit Number 4  Number of Visits 17  Date for PT Re-Evaluation 04/24/16  Authorization  Authorization Type Medicare G-Code & progress note  PT Time Calculation  PT Start Time 1448  PT Stop Time 1530  PT Time Calculation (min) 42 min  PT - End of Session  Equipment Utilized During Treatment Gait belt  Activity Tolerance Patient limited by pain  Behavior During Therapy [facility] for tasks assessed/performed     Past Medical History:  Diagnosis Date  . Anxiety   . Asthma   . Depression   . Diabetes mellitus without complication ([place])    [id number].Marland KitchenMarland Kitchenpt denies  . GERD (gastroesophageal reflux disease)   . Hearing loss   . NHL (non-Hodgkin's lymphoma) ([place])    nhl dx 9/04 breast ca dx1/12  . Parkinson disease [facility])     Past Surgical History:  Procedure Laterality Date  . Ba-HA Ear implant    . MASTECTOMY  2 /8/ 12   bilateral  . MASTECTOMY      There were no vitals filed for this visit.      Subjective Assessment - 03/11/16 1449    Subjective Was feeling great yesterday, ended today when she picked up laudry basket from floor. Now stiff again with pain across lower back that shifts around   Patient is accompained by: Family member   Pertinent History Parkinson's Disease, Non-Hodgkins Lymphoma with chemo '09, Breast CA with bil. mastectomy, asthma   Limitations Lifting;Standing;Walking   Patient Stated Goals To get rid of back pain   Currently in Pain? Yes   Pain Score 5    Pain Location Back   Pain Orientation Lower   Pain Descriptors / Indicators  Aching;Sore;Nagging;Tightness   Pain Type Acute pain   Pain Radiating Towards no longer radiating down all the time, just on occasion   Pain Onset More than a month ago   Pain Frequency Intermittent   Aggravating Factors  increased activity, poor body mechanics   Pain Relieving Factors sitting with back support, exercises       reviewed exercises from  HEP and added new ones as well. Post pelvic tilt, 5 sec hold x 10 reps  With red pball under legs - bridges, 3 sec hold x 10 reps - hamstring curls x 10 reps  In hook lying: Bridge, 3 sec hold x 10 reps (added to HEP) Upper abdominal curls, with arms out and hands on thighs, 3 sec hold x 10 reps (added to HEP) abd bracing with feet together: knees fall out and then pull back together x 10 reps (added to HEP) abd bracing: alternating marching x 10 reps (added to HEP)  Ended ex's with following stretches: Single knee to chest, 20 sec holds x 3 each leg Double knee to chest, 20 sec holds x 4 reps (added to HEP) Seated edge of mat: hamstring stretch 20 sec's x 1 each leg with cues on correct form/technique  Decreased to 0/10 pain after session         PT Short Term Goals - 02/26/16 2022      PT SHORT TERM GOAL #1   Title Patient reports  appointment to assess spinal alignment possible changes with her physician. (Target Date: 03/27/2016)   Time 4   Period Weeks   Status New     PT SHORT TERM GOAL #2   Title Patient demonstrates understanding of initial HEP. (Target Date: 03/27/2016)   Time 4   Period Weeks   Status New     PT SHORT TERM GOAL #3   Title Patient ambulates 250' with single point cane scanning environment with no balance losses with supervision. (Target Date: 03/27/2016)   Time 4   Period Weeks   Status New     PT SHORT TERM GOAL #4   Title Patient negotiates ramps & curbs with single point cane with supervision. (Target Date: 03/27/2016)   Time 4   Period Weeks   Status New           PT Long Term  Goals - 02/26/16 2017      PT LONG TERM GOAL #1   Title Patient will perform HEP / ongoing fitness plan for improved balance, posture strength & flexibility. (Target Date: 04/24/2016)   Time 8   Period Weeks   Status New     PT LONG TERM GOAL #2   Title Berg Balance >/= 50/56 to indicate lower fall risk. (Target Date: 04/24/2016)   Time 8   Period Weeks   Status New     PT LONG TERM GOAL #3   Title Patient ambulates 400' including grass, ramps, curbs outside modified independent for community mobility. (Target Date: 04/24/2016)   Time 8   Period Weeks   Status New     PT LONG TERM GOAL #4   Title Functional Gait Assessment >/= 20/30 to indicate lower fall risk. (Target Date: 04/24/2016)   Time 8   Period Weeks   Status New           03/11/16 1452  Plan  Clinical Impression Statement Continued to focus on core strengthening/stability exercises and stretching exercises today with pt reporting no pain after session. Pt is making steady progress toward goals  Pt will benefit from skilled therapeutic intervention in order to improve on the following deficits Abnormal gait;Decreased activity tolerance;Decreased balance;Decreased endurance;Decreased strength;Postural dysfunction;Pain  Rehab Potential Good  PT Frequency 2x / week  PT Duration 8 weeks  PT Treatment/Interventions ADLs/Self Care Home Management;DME Instruction;Gait training;Stair training;Functional mobility training;Therapeutic activities;Therapeutic exercise;Neuromuscular re-education;Balance training;Patient/family education;Orthotic Fit/Training  PT Next Visit Plan continue to work on Basic Back Exercise program for HEP;education on body mechanics/ADLs as needed.   Consulted and Agree with Plan of Care Patient;Family member/caregiver  Family Member Consulted husband       Patient will benefit from skilled therapeutic intervention in order to improve the following deficits and impairments:  Abnormal gait, Decreased  activity tolerance, Decreased balance, Decreased endurance, Decreased strength, Postural dysfunction, Pain  Visit Diagnosis: Midline low back pain without sciatica  Other abnormalities of gait and mobility  Unsteadiness on feet  Muscle weakness (generalized)  Abnormal posture     Problem List Patient Active Problem List   Diagnosis Date Noted  . Murmur 09/02/2015  . Parkinson disease ([place]) 09/02/2015  . Bilateral carotid bruits 09/02/2015  . Confusion 08/07/2015  . Acute encephalopathy 08/07/2015  . UTI (lower urinary tract infection) 08/07/2015  . Acute kidney failure ([place]) 08/07/2015  . Paralysis agitans ([place]) 09/19/2012  . Lymphoma ([place]) 07/03/2011  . Malignant neoplasm of female breast ([place]) 07/03/2011  . Abnormal CT of the chest 03/13/2011  . HYPERTENSION, BENIGN  01/01/2009  . EDEMA 01/01/2009    Willow Ora, PTA, [place] [address], [place], [place] [address] [phone] 03/12/16, 11:24 PM   Name: Ashely Caputa MRN: [id number] Date of Birth: [birth year]

## 2016-03-11 NOTE — Therapy (Signed)
[facility] [address] [place], [name], [address] Phone: [phone]   Fax:  [phone]  Speech Language Pathology Treatment  Patient Details  Name: [name] MRN: [id number] Date of Birth: [birth year] Referring Provider: Dr. Wells Guiles Tat  Encounter Date: 03/11/2016      End of Session - 03/11/16 1357    Visit Number 9   Number of Visits 17   Date for SLP Re-Evaluation 03/26/16   SLP Start Time 1319   SLP Stop Time  1400   SLP Time Calculation (min) 41 min   Activity Tolerance Patient tolerated treatment well      Past Medical History:  Diagnosis Date  . Anxiety   . Asthma   . Depression   . Diabetes mellitus without complication ([place])    04/11/37.Marland KitchenMarland Kitchenpt denies  . GERD (gastroesophageal reflux disease)   . Hearing loss   . NHL (non-Hodgkin's lymphoma) ([place])    nhl dx 9/04 breast ca dx1/12  . Parkinson disease [facility])     Past Surgical History:  Procedure Laterality Date  . Ba-HA Ear implant    . MASTECTOMY  2 /8/ 12   bilateral  . MASTECTOMY      There were no vitals filed for this visit.      Subjective Assessment - 03/11/16 1322    Subjective "Yesterday I was actually feeling better. I think it was the exercises they gave  me."               ADULT SLP TREATMENT - 03/11/16 1325      General Information   Behavior/Cognition Alert;Cooperative;Pleasant mood     Pain Assessment   Pain Assessment 0-10   Pain Score 5    Pain Location back   Pain Descriptors / Indicators Sore   Pain Intervention(s) Monitored during session     Cognitive-Linquistic Treatment   Treatment focused on Dysarthria;Cognition   Skilled Treatment SLP facilitated pt's louder, WNL conversational speech with loud /a/ to recalibrate loudness, with average 84dB. In multisentence tasks pt maintained 70dB average with 90-95% of structured speech tasks. Pt reports opening pantry instead of refrigerator and vice  verse and inquired to SLP why that was happening. SLP explained that could be due to reduced attention and/or memory, and suggested pt repeat where the item is prior to arriving there. In simple to mod complex conversation pt maintained volume of at least 70dB 90% of the time. SLP req'd to cue pt x1 when pt produced sub-70dB loudness and pt raised volume immediately to 70dB or higher for remainder of session.     Assessment / Recommendations / Plan   Plan Continue with current plan of care     Progression Toward Goals   Progression toward goals Progressing toward goals          SLP Education - 03/11/16 1357    Education provided Yes   Education Details Compensation for pantry/refrigerator mixup   Person(s) Educated Patient   Methods Explanation   Comprehension Verbalized understanding          SLP Short Term Goals - 03/04/16 1410      SLP SHORT TERM GOAL #1   Title Pt will demonstrate sustained /a/ of 85dB with rare min A over 3 sessions   Baseline two sessions 02-26-16   Time 1   Period Weeks   Status Not Met     SLP SHORT TERM GOAL #2   Title Pt will maintain average of 72dB during  structured speech tasks with occassional min A over 3 sessions    Time 1   Period Weeks   Status Not Met     SLP SHORT TERM GOAL #3   Title --   Status --          SLP Long Term Goals - 03/11/16 1401      SLP LONG TERM GOAL #1   Title Maintain an average of 70dB during conversation over 12 minutes with rare min A over two sessions    Baseline one session 03-11-16   Time 3   Period Weeks   Status On-going     SLP LONG TERM GOAL #2   Title Philis Nettle audilbly in a noisy environment for 10 minutes with rare min A (01/08/15)   Time 3   Period Weeks   Status On-going          Plan - 03/11/16 1358    Clinical Impression Statement Pt performance today was very different than last week, as she maintained volume of 70dB in simple to mod complex conversation for 25 minutes with one  verbal cue from SLP. SLP suspects pt's attention/focus on therapy tasks is reduced by back pain/difficulty and/or possible fatigue after PT/OT. Skilled ST remains necessary to ensure carryover to non-therapy environments.   Speech Therapy Frequency 2x / week   Duration --  3 weeks   Treatment/Interventions SLP instruction and feedback;Compensatory strategies;Patient/family education;Functional tasks   Potential to Achieve Goals Good   Potential Considerations Ability to learn/carryover information      Patient will benefit from skilled therapeutic intervention in order to improve the following deficits and impairments:   Dysarthria and anarthria    Problem List Patient Active Problem List   Diagnosis Date Noted  . Murmur 09/02/2015  . Parkinson disease ([place]) 09/02/2015  . Bilateral carotid bruits 09/02/2015  . Confusion 08/07/2015  . Acute encephalopathy 08/07/2015  . UTI (lower urinary tract infection) 08/07/2015  . Acute kidney failure ([place]) 08/07/2015  . Paralysis agitans ([place]) 09/19/2012  . Lymphoma ([place]) 07/03/2011  . Malignant neoplasm of female breast ([place]) 07/03/2011  . Abnormal CT of the chest 03/13/2011  . HYPERTENSION, BENIGN 01/01/2009  . EDEMA 01/01/2009    [facility] ,MS, CCC-SLP  03/11/2016, 2:02 PM  [facility] [address] [place], [name], [address] Phone: [phone]   Fax:  [phone]   Name: [name] MRN: [id number] Date of Birth: 03-Feb-1928

## 2016-03-13 ENCOUNTER — Ambulatory Visit: Payer: Medicare Other

## 2016-03-13 ENCOUNTER — Ambulatory Visit: Payer: Medicare Other | Admitting: Physical Therapy

## 2016-03-13 ENCOUNTER — Encounter: Payer: Self-pay | Admitting: Physical Therapy

## 2016-03-13 DIAGNOSIS — R471 Dysarthria and anarthria: Secondary | ICD-10-CM

## 2016-03-13 DIAGNOSIS — R2689 Other abnormalities of gait and mobility: Secondary | ICD-10-CM

## 2016-03-13 DIAGNOSIS — M545 Low back pain, unspecified: Secondary | ICD-10-CM

## 2016-03-13 DIAGNOSIS — R2681 Unsteadiness on feet: Secondary | ICD-10-CM | POA: Diagnosis not present

## 2016-03-13 DIAGNOSIS — R293 Abnormal posture: Secondary | ICD-10-CM

## 2016-03-13 DIAGNOSIS — M6281 Muscle weakness (generalized): Secondary | ICD-10-CM

## 2016-03-13 NOTE — Therapy (Signed)
[facility] [address] [place], [name], [address] Phone: [phone]   Fax:  [phone]  Speech Language Pathology Treatment  Patient Details  Name: [name] MRN: [id number] Date of Birth: [birth year] Referring Provider: Dr. Wells Guiles Tat  Encounter Date: 03/13/2016      End of Session - 03/13/16 1650    Visit Number 10   Number of Visits 17   Date for SLP Re-Evaluation 03/26/16   SLP Start Time 1532   SLP Stop Time  0932   SLP Time Calculation (min) 42 min   Activity Tolerance Patient tolerated treatment well      Past Medical History:  Diagnosis Date  . Anxiety   . Asthma   . Depression   . Diabetes mellitus without complication ([place])    3/55/73.Marland KitchenMarland Kitchenpt denies  . GERD (gastroesophageal reflux disease)   . Hearing loss   . NHL (non-Hodgkin's lymphoma) ([place])    nhl dx 9/04 breast ca dx1/12  . Parkinson disease [facility]     Past Surgical History:  Procedure Laterality Date  . Ba-HA Ear implant    . MASTECTOMY  2 /8/ 12   bilateral  . MASTECTOMY      There were no vitals filed for this visit.             ADULT SLP TREATMENT - 03/13/16 1546      General Information   Behavior/Cognition Alert;Cooperative;Pleasant mood     Treatment Provided   Treatment provided Cognitive-Linquistic     Pain Assessment   Pain Assessment 0-10   Pain Score 3    Pain Location back (lower)   Pain Descriptors / Indicators Sore   Pain Intervention(s) Monitored during session     Cognitive-Linquistic Treatment   Treatment focused on Dysarthria;Cognition   Skilled Treatment Loud /a/ targeted today with average 85dB. In multisentence structured task, pt req'd mod A usually for loudness when describing pictures. In conversation (simple) pt maintained loudness average 68dB over 5 minutes x3, despite SLP min cues occasionally.      Assessment / Recommendations / Plan   Plan Continue with current plan of  care     Progression Toward Goals   Progression toward goals Progressing toward goals            SLP Short Term Goals - 03/04/16 1410      SLP SHORT TERM GOAL #1   Title Pt will demonstrate sustained /a/ of 85dB with rare min A over 3 sessions   Baseline two sessions 02-26-16   Time 1   Period Weeks   Status Not Met     SLP SHORT TERM GOAL #2   Title Pt will maintain average of 72dB during structured speech tasks with occassional min A over 3 sessions    Time 1   Period Weeks   Status Not Met     SLP SHORT TERM GOAL #3   Title --   Status --          SLP Long Term Goals - 03/13/16 1652      SLP LONG TERM GOAL #1   Title Maintain an average of 70dB during conversation over 12 minutes with rare min A over two sessions    Baseline one session 03-11-16   Time 3   Period Weeks   Status On-going     SLP LONG TERM GOAL #2   Title Philis Nettle audilbly in a noisy environment for 10 minutes with rare min A (01/08/15)  Time 3   Period Weeks   Status On-going          Plan - 03/13/16 1650    Clinical Impression Statement Pt performance today was different than that of yesterday, as she maintained volume of only 68dB in segments of simple conversation for only 5 minutes. Pt has difficulty with consistency in speech volume from session to session. Skilled ST remains necessary to ensure carryover to non-therapy environments.   Speech Therapy Frequency 2x / week   Duration --  3 weeks   Treatment/Interventions SLP instruction and feedback;Compensatory strategies;Patient/family education;Functional tasks   Potential to Achieve Goals Good   Potential Considerations Ability to learn/carryover information      Patient will benefit from skilled therapeutic intervention in order to improve the following deficits and impairments:   Dysarthria and anarthria    Problem List Patient Active Problem List   Diagnosis Date Noted  . Murmur 09/02/2015  . Parkinson disease ([place])  09/02/2015  . Bilateral carotid bruits 09/02/2015  . Confusion 08/07/2015  . Acute encephalopathy 08/07/2015  . UTI (lower urinary tract infection) 08/07/2015  . Acute kidney failure ([place]) 08/07/2015  . Paralysis agitans ([place]) 09/19/2012  . Lymphoma ([place]) 07/03/2011  . Malignant neoplasm of female breast ([place]) 07/03/2011  . Abnormal CT of the chest 03/13/2011  . HYPERTENSION, BENIGN 01/01/2009  . EDEMA 01/01/2009    [facility] ,MS, CCC-SLP  03/13/2016, 4:52 PM  [facility] [address] [place], [name], [address] Phone: [phone]   Fax:  [phone]   Name: [name] MRN: [id number] Date of Birth: [birth year]

## 2016-03-14 NOTE — Therapy (Signed)
[facility] [address] [place], [name], [address] Phone: [phone]   Fax:  [phone]  Physical Therapy Treatment  Patient Details  Name: [name] MRN: [id number] Date of Birth: 01/13/1928 Referring Provider: Suella Broad, MD [name], MD PCP)  Encounter Date: 03/13/2016      PT End of Session - 03/13/16 1452    Visit Number 5   Number of Visits 17   Date for PT Re-Evaluation 04/24/16   Authorization Type Medicare G-Code & progress note   PT Start Time 1450   PT Stop Time 1530   PT Time Calculation (min) 40 min   Equipment Utilized During Treatment Gait belt   Activity Tolerance Patient limited by pain   Behavior During Therapy [facility] for tasks assessed/performed      Past Medical History:  Diagnosis Date  . Anxiety   . Asthma   . Depression   . Diabetes mellitus without complication ([place])    [id number].Marland KitchenMarland Kitchenpt denies  . GERD (gastroesophageal reflux disease)   . Hearing loss   . NHL (non-Hodgkin's lymphoma) ([place])    nhl dx 9/04 breast ca dx1/12  . Parkinson disease [facility])     Past Surgical History:  Procedure Laterality Date  . Ba-HA Ear implant    . MASTECTOMY  2 /8/ 12   bilateral  . MASTECTOMY      There were no vitals filed for this visit.      Subjective Assessment - 03/13/16 1451    Subjective Sore today, no pain. Was tired after last session. They are planning to go to [place] next week for vacation after all.    Patient is accompained by: Family member   Pertinent History Parkinson's Disease, Non-Hodgkins Lymphoma with chemo '09, Breast CA with bil. mastectomy, asthma   Limitations Lifting;Standing;Walking   Patient Stated Goals To get rid of back pain   Currently in Pain? No/denies   Pain Score 0-No pain           [facility] Adult PT Treatment/Exercise - 03/13/16 1501      Transfers   Transfers Sit to Stand;Stand to Sit     Ambulation/Gait   Ambulation/Gait Yes   Ambulation/Gait Assistance 5: Supervision;4: Min guard   Ambulation/Gait Assistance Details occasional cues on posture with all gait. no AD on indoor surfaces, cane used with outdoor gait.  pt able to maintain sequencing with cane when focused, however with distaction would carry cane vs using it.                                         Ambulation Distance (Feet) 420 Feet  x1; 500 x1   Assistive device None;Straight cane   Gait Pattern Decreased arm swing - right;Decreased stride length;Decreased step length - right;Trunk flexed   Ambulation Surface Level;Unlevel;Indoor;Outdoor;Paved   Stairs Yes   Stairs Assistance 5: Supervision;4: Min guard   Stairs Assistance Details (indicate cue type and reason) no cues needed. no balance loss noted. Did educate on step to pattern for if pt is feeling tired or having back pain for energy conservation with stair negotiation.                                           Stair Management Technique One rail Right;Alternating  pattern;Forwards   Number of Stairs 4   Height of Stairs 6     Knee/Hip Exercises: Stretches   Other Knee/Hip Stretches standing trunk extension, 3 sec holds x 10 reps  added to HEP (basic back exercise packet)          PT Education - 03/13/16 2044    Education provided Yes   Education Details to only use single use hot pack on plane as needed for pain, to move every 1 hour as able on plane with longer plane times, posture with pulling rolling luggage behind her, lifting techniques from floor/waist high, stairs, gait with cane for longer distances for energy conservation and other ways for energy conservation while traveling.                                                                     Person(s) Educated Patient;Spouse   Methods Explanation;Demonstration;Verbal cues   Comprehension Verbalized understanding;Returned demonstration          PT Short Term Goals - 03/13/16 1456      PT SHORT TERM GOAL #1   Title Patient  reports appointment to assess spinal alignment possible changes with her physician. (Target Date: 03/27/2016)   Time 4   Period Weeks   Status New     PT SHORT TERM GOAL #2   Title Patient demonstrates understanding of initial HEP. (Target Date: 03/27/2016)   Time 4   Period Weeks   Status New     PT SHORT TERM GOAL #3   Title Patient ambulates 250' with single point cane scanning environment with no balance losses with supervision. (Target Date: 03/27/2016)   Time 4   Period Weeks   Status New     PT SHORT TERM GOAL #4   Title Patient negotiates ramps & curbs with single point cane with supervision. (Target Date: 03/27/2016)   Time 4   Period Weeks   Status New           PT Long Term Goals - 02/26/16 2017      PT LONG TERM GOAL #1   Title Patient will perform HEP / ongoing fitness plan for improved balance, posture strength & flexibility. (Target Date: 04/24/2016)   Time 8   Period Weeks   Status New     PT LONG TERM GOAL #2   Title Berg Balance >/= 50/56 to indicate lower fall risk. (Target Date: 04/24/2016)   Time 8   Period Weeks   Status New     PT LONG TERM GOAL #3   Title Patient ambulates 400' including grass, ramps, curbs outside modified independent for community mobility. (Target Date: 04/24/2016)   Time 8   Period Weeks   Status New     PT LONG TERM GOAL #4   Title Functional Gait Assessment >/= 20/30 to indicate lower fall risk. (Target Date: 04/24/2016)   Time 8   Period Weeks   Status New           Plan - 03/13/16 1452    Clinical Impression Statement Today's session focused on gait without AD for short distances and with straight cane for longer distances. Pt able to maintain good sequencing with cane until distracted, then needed cues to use cane (  would carry it vs using it). Occasional toe scuffing noted on level surfaces, however not on outdoor surfaces with multi-tasking. Pt/spouse planning to travel to [place] next week for vacation for 1.5 weeks.  Majority of session was spent addressing potential issues with travel as pt is now pain free and their goal is for her to remain pain free with vacation. Addressed body mechanics, use of heat pad on plane, how to carry/pull luggage, pacing of activities, use of cane if walking long distances, stair negotiation, and to continue with HEP (added 1 more to it today. Pt is making great progress toward goals (no pain at this time) however will benefit from continued PT to progress toward unmet goals.                                                          Rehab Potential Good   PT Frequency 2x / week   PT Duration 8 weeks   PT Treatment/Interventions ADLs/Self Care Home Management;DME Instruction;Gait training;Stair training;Functional mobility training;Therapeutic activities;Therapeutic exercise;Neuromuscular re-education;Balance training;Patient/family education;Orthotic Fit/Training   PT Next Visit Plan continue to work on Basic Back Exercise program for HEP;education on body mechanics/ADLs as needed.    Consulted and Agree with Plan of Care Patient;Family member/caregiver   Family Member Consulted husband      Patient will benefit from skilled therapeutic intervention in order to improve the following deficits and impairments:  Abnormal gait, Decreased activity tolerance, Decreased balance, Decreased endurance, Decreased strength, Postural dysfunction, Pain  Visit Diagnosis: Midline low back pain without sciatica  Other abnormalities of gait and mobility  Unsteadiness on feet  Muscle weakness (generalized)  Abnormal posture     Problem List Patient Active Problem List   Diagnosis Date Noted  . Murmur 09/02/2015  . Parkinson disease ([place]) 09/02/2015  . Bilateral carotid bruits 09/02/2015  . Confusion 08/07/2015  . Acute encephalopathy 08/07/2015  . UTI (lower urinary tract infection) 08/07/2015  . Acute kidney failure ([place]) 08/07/2015  . Paralysis agitans ([place]) 09/19/2012  .  Lymphoma ([place]) 07/03/2011  . Malignant neoplasm of female breast ([place]) 07/03/2011  . Abnormal CT of the chest 03/13/2011  . HYPERTENSION, BENIGN 01/01/2009  . EDEMA 01/01/2009    Willow Ora, PTA, [place] [address] [place], [place] [address] [phone] 03/14/16, 8:46 PM   Name: [name] MRN: [id number] Date of Birth: [birth year]

## 2016-03-16 ENCOUNTER — Ambulatory Visit: Payer: Medicare Other | Admitting: Physical Therapy

## 2016-03-16 ENCOUNTER — Telehealth: Payer: Self-pay | Admitting: Neurology

## 2016-03-16 MED ORDER — SERTRALINE HCL 25 MG PO TABS: ORAL_TABLET | ORAL | 5 refills | Status: DC

## 2016-03-16 NOTE — Telephone Encounter (Signed)
Received request from [facility] to refill patient's zoloft 25 mg to take 2 tablets daily. Last office note says can increase if discussed with prescribing provider. Would you like to approved medication?

## 2016-03-16 NOTE — Telephone Encounter (Signed)
If I am not the prescribing physician, they need to ask the person who prescribes that one

## 2016-03-16 NOTE — Telephone Encounter (Signed)
LMOM making patient aware.  

## 2016-03-16 NOTE — Telephone Encounter (Signed)
Patient's husband made aware RX sent to pharmacy per Dr. [name].

## 2016-03-16 NOTE — Telephone Encounter (Signed)
PT called in regards to wifes prescription/Dawn CB# [phone]

## 2016-03-18 ENCOUNTER — Ambulatory Visit: Payer: Medicare Other | Admitting: Physical Therapy

## 2016-03-24 ENCOUNTER — Ambulatory Visit: Payer: Medicare Other | Admitting: Physical Therapy

## 2016-03-24 ENCOUNTER — Encounter: Payer: Medicare Other | Admitting: Speech Pathology

## 2016-03-25 ENCOUNTER — Ambulatory Visit: Payer: Medicare Other | Admitting: Physical Therapy

## 2016-04-06 ENCOUNTER — Ambulatory Visit: Payer: Medicare Other | Admitting: Physical Therapy

## 2016-04-06 ENCOUNTER — Ambulatory Visit: Payer: Medicare Other | Admitting: *Deleted

## 2016-04-07 ENCOUNTER — Telehealth: Payer: Self-pay | Admitting: Oncology

## 2016-04-07 ENCOUNTER — Ambulatory Visit (HOSPITAL_BASED_OUTPATIENT_CLINIC_OR_DEPARTMENT_OTHER): Payer: Medicare Other | Admitting: Oncology

## 2016-04-07 ENCOUNTER — Other Ambulatory Visit (HOSPITAL_BASED_OUTPATIENT_CLINIC_OR_DEPARTMENT_OTHER): Payer: Medicare Other

## 2016-04-07 VITALS — BP 140/69 | HR 82 | Temp 99.0°F | Resp 16 | Ht 63.5 in | Wt 128.7 lb

## 2016-04-07 DIAGNOSIS — C8590 Non-Hodgkin lymphoma, unspecified, unspecified site: Secondary | ICD-10-CM

## 2016-04-07 LAB — CBC WITH DIFFERENTIAL/PLATELET
BASO%: 0.9 % (ref 0.0–2.0)
BASOS ABS: 0 10*3/uL (ref 0.0–0.1)
EOS ABS: 0.1 10*3/uL (ref 0.0–0.5)
EOS%: 2.7 % (ref 0.0–7.0)
HEMATOCRIT: 33.6 % — AB (ref 34.8–46.6)
HEMOGLOBIN: 11.5 g/dL — AB (ref 11.6–15.9)
LYMPH#: 1 10*3/uL (ref 0.9–3.3)
LYMPH%: 23.2 % (ref 14.0–49.7)
MCH: 30.1 pg (ref 25.1–34.0)
MCHC: 34.2 g/dL (ref 31.5–36.0)
MCV: 88 fL (ref 79.5–101.0)
MONO#: 0.4 10*3/uL (ref 0.1–0.9)
MONO%: 8.8 % (ref 0.0–14.0)
NEUT#: 2.9 10*3/uL (ref 1.5–6.5)
NEUT%: 64.4 % (ref 38.4–76.8)
PLATELETS: 164 10*3/uL (ref 145–400)
RBC: 3.82 10*6/uL (ref 3.70–5.45)
RDW: 12.5 % (ref 11.2–14.5)
WBC: 4.4 10*3/uL (ref 3.9–10.3)
nRBC: 0 % (ref 0–0)

## 2016-04-07 LAB — COMPREHENSIVE METABOLIC PANEL
ALBUMIN: 3.3 g/dL — AB (ref 3.5–5.0)
ALK PHOS: 170 U/L — AB (ref 40–150)
ALT: 13 U/L (ref 0–55)
ANION GAP: 10 meq/L (ref 3–11)
AST: 27 U/L (ref 5–34)
BUN: 15.8 mg/dL (ref 7.0–26.0)
CALCIUM: 10 mg/dL (ref 8.4–10.4)
CHLORIDE: 105 meq/L (ref 98–109)
CO2: 28 mEq/L (ref 22–29)
CREATININE: 1.2 mg/dL — AB (ref 0.6–1.1)
EGFR: 42 mL/min/{1.73_m2} — ABNORMAL LOW (ref >90)
Glucose: 97 mg/dl (ref 70–140)
POTASSIUM: 4.5 meq/L (ref 3.5–5.1)
Sodium: 143 mEq/L (ref 136–145)
Total Bilirubin: 0.55 mg/dL (ref 0.20–1.20)
Total Protein: 6.6 g/dL (ref 6.4–8.3)

## 2016-04-07 NOTE — Progress Notes (Signed)
  [facility] OFFICE PROGRESS NOTE   Diagnosis: Lymphoma, breast cancer, ITP  INTERVAL HISTORY:   [name] returns as scheduled. She currently has an upper respiratory infection with sinus congestion and a cough. No fever or dyspnea. Her husband and sister have a similar illness. Good appetite.  Objective:  Vital signs in last 24 hours:  Blood pressure 140/69, pulse 82, temperature 99 F (37.2 C), temperature source Oral, resp. rate 16, height 5' 3.5" (1.613 m), weight 128 lb 11.2 oz (58.4 kg), SpO2 95 %.    HEENT: Neck without mass Lymphatics: No cervical, supraclavicular, or axillary nodes Resp: Bronchial sounds at the lower posterior chest bilaterally, no respiratory distress Cardio: Regular rate and rhythm GI: No hepatosplenomegaly, no mass, nontender Vascular: No leg edema Breasts: Status post bilateral mastectomy. No evidence for chest wall tumor recurrence.    Lab Results:  Lab Results  Component Value Date   WBC 4.4 04/07/2016   HGB 11.5 (L) 04/07/2016   HCT 33.6 (L) 04/07/2016   MCV 88.0 04/07/2016   PLT 164 04/07/2016   NEUTROABS 2.9 04/07/2016    Medications: I have reviewed the patient's current medications.  Assessment/Plan: 1. Non-Hodgkin lymphoma treated with fludarabine/rituximab, last in July of 2009.  1. Restaging CT 11/24/2010 revealed evidence of progressive lymphoma in the abdomen and pelvis.  2. Initiation of salvage therapy with bendamustine/rituximab 12/04/2010. She completed 3 cycles.  3. Restaging CT 03/03/2011 revealed stable retroperitoneal soft tissue and a marked decrease in the soft tissue thickening associated with dilated loops of small bowel. 2. History of ITP. Intermittent mild thrombocytopenia persists. 3. History of Herpes zoster-maintained on prophylactic acyclovir. She will decrease the acyclovir to once daily 4. Anemia secondary to non-Hodgkin lymphoma, chemotherapy, and a history of autoimmune hemolysis.   5. Hypertension. 6. Right-hand tremor/ataxia-followed by neurology, now taking Sinemet, diagnosed with Parkinson's disease 7. Hearing loss-status post placement of an implanted hearing device by Dr. Cresenciano Lick. 8. Bilateral breast cancer, right breast cancer-grade 1, T1 N0, ER/PR positive, and HER2 negative. Left breast cancer, synchronous grade 2, T2 N0, ER/PR positive, and HER2 negative. She began adjuvant Arimidex on 09/15/2010,completed February 2017 9. Inflammatory changes of both lungs on a CT of the chest 11/24/2010-progressive on the restaging CT 03/03/2011, status post a bronchoscopy 03/17/2011 with no evidence of granulomata or tumor. 10. 11 mm spiculated lesion in the lingula on a CT 11/24/2010-less distinct on the CT 03/03/2011. 11. Upper respiratory infection while visiting her son in [place], June 2013, resolved after treatment with Levaquin/steroids 12. Upper respiratory infection,? Pneumonia summer 2014-chest x-ray 02/17/2013 with bilateral infiltrates 13. CT chest abdomen and pelvis on 11/29/2014 with no evidence of active lymphoma or metastatic breast cancer. Previously demonstrated retroperitoneal process appeared improved with probable residual fibrosis. No discrete adenopathy. Interval near complete resolution of patchy airspace opacities in both lungs.     Disposition:  She remains in clinical remission from breast cancer and lymphoma. I suspect she has a viral upper respiratory infection. She will seek medical attention for a fever or dyspnea. She will obtain an influenza vaccine via Dr. Kenton Kingfisher when the upper respiratory infection has resolved.  She will return for an office visit in 4 months.  Betsy Coder, MD  04/07/2016  11:25 AM

## 2016-04-07 NOTE — Telephone Encounter (Signed)
Avs report and schedule given per 04/07/16 los. °

## 2016-04-08 DIAGNOSIS — J209 Acute bronchitis, unspecified: Secondary | ICD-10-CM | POA: Diagnosis not present

## 2016-04-08 DIAGNOSIS — M5441 Lumbago with sciatica, right side: Secondary | ICD-10-CM | POA: Diagnosis not present

## 2016-04-08 DIAGNOSIS — M5442 Lumbago with sciatica, left side: Secondary | ICD-10-CM | POA: Diagnosis not present

## 2016-04-08 DIAGNOSIS — E785 Hyperlipidemia, unspecified: Secondary | ICD-10-CM | POA: Diagnosis not present

## 2016-04-08 DIAGNOSIS — K219 Gastro-esophageal reflux disease without esophagitis: Secondary | ICD-10-CM | POA: Diagnosis not present

## 2016-04-08 DIAGNOSIS — F419 Anxiety disorder, unspecified: Secondary | ICD-10-CM | POA: Diagnosis not present

## 2016-04-09 ENCOUNTER — Ambulatory Visit: Payer: Medicare Other | Admitting: Physical Therapy

## 2016-04-10 ENCOUNTER — Ambulatory Visit: Payer: Medicare Other

## 2016-04-13 ENCOUNTER — Encounter (HOSPITAL_COMMUNITY): Payer: Self-pay | Admitting: Emergency Medicine

## 2016-04-13 ENCOUNTER — Other Ambulatory Visit: Payer: Self-pay

## 2016-04-13 ENCOUNTER — Inpatient Hospital Stay (HOSPITAL_COMMUNITY)
Admission: EM | Admit: 2016-04-13 | Discharge: 2016-04-17 | DRG: 194 | Disposition: A | Discharge status: Home / Self Care | Payer: Medicare Other | Attending: Internal Medicine | Admitting: Internal Medicine

## 2016-04-13 ENCOUNTER — Emergency Department (HOSPITAL_COMMUNITY): Payer: Medicare Other

## 2016-04-13 DIAGNOSIS — R0600 Dyspnea, unspecified: Secondary | ICD-10-CM | POA: Diagnosis not present

## 2016-04-13 DIAGNOSIS — Z8572 Personal history of non-Hodgkin lymphomas: Secondary | ICD-10-CM

## 2016-04-13 DIAGNOSIS — K219 Gastro-esophageal reflux disease without esophagitis: Secondary | ICD-10-CM | POA: Diagnosis present

## 2016-04-13 DIAGNOSIS — F329 Major depressive disorder, single episode, unspecified: Secondary | ICD-10-CM | POA: Diagnosis present

## 2016-04-13 DIAGNOSIS — H9190 Unspecified hearing loss, unspecified ear: Secondary | ICD-10-CM | POA: Diagnosis present

## 2016-04-13 DIAGNOSIS — N179 Acute kidney failure, unspecified: Secondary | ICD-10-CM | POA: Diagnosis present

## 2016-04-13 DIAGNOSIS — E119 Type 2 diabetes mellitus without complications: Secondary | ICD-10-CM | POA: Diagnosis present

## 2016-04-13 DIAGNOSIS — G20 Parkinson's disease: Secondary | ICD-10-CM | POA: Diagnosis present

## 2016-04-13 DIAGNOSIS — F419 Anxiety disorder, unspecified: Secondary | ICD-10-CM | POA: Diagnosis present

## 2016-04-13 DIAGNOSIS — Z79899 Other long term (current) drug therapy: Secondary | ICD-10-CM

## 2016-04-13 DIAGNOSIS — Z9013 Acquired absence of bilateral breasts and nipples: Secondary | ICD-10-CM

## 2016-04-13 DIAGNOSIS — J189 Pneumonia, unspecified organism: Principal | ICD-10-CM | POA: Diagnosis present

## 2016-04-13 DIAGNOSIS — Z853 Personal history of malignant neoplasm of breast: Secondary | ICD-10-CM | POA: Diagnosis not present

## 2016-04-13 DIAGNOSIS — Z801 Family history of malignant neoplasm of trachea, bronchus and lung: Secondary | ICD-10-CM | POA: Diagnosis not present

## 2016-04-13 DIAGNOSIS — R079 Chest pain, unspecified: Secondary | ICD-10-CM | POA: Diagnosis not present

## 2016-04-13 DIAGNOSIS — R05 Cough: Secondary | ICD-10-CM | POA: Diagnosis not present

## 2016-04-13 LAB — BASIC METABOLIC PANEL
Anion gap: 8 (ref 5–15)
BUN: 24 mg/dL — AB (ref 6–20)
CALCIUM: 9.5 mg/dL (ref 8.9–10.3)
CO2: 29 mmol/L (ref 22–32)
CREATININE: 1.31 mg/dL — AB (ref 0.44–1.00)
Chloride: 101 mmol/L (ref 101–111)
GFR calc non Af Amer: 36 mL/min — ABNORMAL LOW (ref >60)
GFR, EST AFRICAN AMERICAN: 41 mL/min — AB (ref >60)
Glucose, Bld: 138 mg/dL — ABNORMAL HIGH (ref 65–99)
Potassium: 4.4 mmol/L (ref 3.5–5.1)
SODIUM: 138 mmol/L (ref 135–145)

## 2016-04-13 LAB — URINALYSIS, ROUTINE W REFLEX MICROSCOPIC
BILIRUBIN URINE: NEGATIVE
Glucose, UA: NEGATIVE mg/dL
Hgb urine dipstick: NEGATIVE
Ketones, ur: NEGATIVE mg/dL
LEUKOCYTES UA: NEGATIVE
Nitrite: NEGATIVE
Protein, ur: NEGATIVE mg/dL
SPECIFIC GRAVITY, URINE: 1.01 (ref 1.005–1.030)
pH: 7 (ref 5.0–8.0)

## 2016-04-13 LAB — CBC
HCT: 32.8 % — ABNORMAL LOW (ref 36.0–46.0)
Hemoglobin: 11.2 g/dL — ABNORMAL LOW (ref 12.0–15.0)
MCH: 30.4 pg (ref 26.0–34.0)
MCHC: 34.1 g/dL (ref 30.0–36.0)
MCV: 88.9 fL (ref 78.0–100.0)
PLATELETS: 251 10*3/uL (ref 150–400)
RBC: 3.69 MIL/uL — AB (ref 3.87–5.11)
RDW: 12.3 % (ref 11.5–15.5)
WBC: 6.9 10*3/uL (ref 4.0–10.5)

## 2016-04-13 LAB — I-STAT TROPONIN, ED: Troponin i, poc: 0.02 ng/mL (ref 0.00–0.08)

## 2016-04-13 LAB — I-STAT CG4 LACTIC ACID, ED: Lactic Acid, Venous: 2.14 mmol/L (ref 0.5–1.9)

## 2016-04-13 MED ORDER — ACYCLOVIR 400 MG PO TABS
400.0000 mg | ORAL_TABLET | Freq: Two times a day (BID) | ORAL | Status: DC
  Administered 2016-04-14 – 2016-04-17 (×7): 400 mg via ORAL
  Filled 2016-04-13 (×8): qty 1

## 2016-04-13 MED ORDER — BISACODYL 5 MG PO TBEC: 5.0000 mg | DELAYED_RELEASE_TABLET | Freq: Every day | ORAL | Status: DC | PRN

## 2016-04-13 MED ORDER — ENOXAPARIN SODIUM 40 MG/0.4ML ~~LOC~~ SOLN
40.0000 mg | SUBCUTANEOUS | Status: DC
  Administered 2016-04-14 – 2016-04-16 (×2): 40 mg via SUBCUTANEOUS
  Filled 2016-04-13 (×2): qty 0.4

## 2016-04-13 MED ORDER — ENTACAPONE 200 MG PO TABS
200.0000 mg | ORAL_TABLET | ORAL | Status: DC
  Administered 2016-04-13 – 2016-04-17 (×12): 200 mg via ORAL
  Filled 2016-04-13 (×14): qty 1

## 2016-04-13 MED ORDER — CALCIUM CARBONATE 1250 (500 CA) MG PO TABS
500.0000 mg | ORAL_TABLET | Freq: Two times a day (BID) | ORAL | Status: DC
  Administered 2016-04-14 – 2016-04-17 (×7): 500 mg via ORAL
  Filled 2016-04-13 (×8): qty 1

## 2016-04-13 MED ORDER — MIRABEGRON ER 50 MG PO TB24
50.0000 mg | ORAL_TABLET | Freq: Every day | ORAL | Status: DC
  Administered 2016-04-14 – 2016-04-15 (×2): 50 mg via ORAL
  Filled 2016-04-13 (×2): qty 1

## 2016-04-13 MED ORDER — DEXTROSE 5 % IV SOLN
1.0000 g | INTRAVENOUS | Status: DC
  Administered 2016-04-14 – 2016-04-15 (×2): 1 g via INTRAVENOUS
  Filled 2016-04-13 (×2): qty 10

## 2016-04-13 MED ORDER — VITAMIN D3 25 MCG (1000 UNIT) PO TABS
1000.0000 [IU] | ORAL_TABLET | Freq: Every day | ORAL | Status: DC
  Administered 2016-04-14 – 2016-04-17 (×4): 1000 [IU] via ORAL
  Filled 2016-04-13 (×4): qty 1

## 2016-04-13 MED ORDER — SODIUM CHLORIDE 0.9 % IV SOLN
INTRAVENOUS | Status: DC
  Administered 2016-04-13 – 2016-04-14 (×2): via INTRAVENOUS

## 2016-04-13 MED ORDER — CARBIDOPA-LEVODOPA 25-100 MG PO TABS
1.0000 | ORAL_TABLET | ORAL | Status: DC
  Administered 2016-04-14 – 2016-04-17 (×8): 1 via ORAL
  Filled 2016-04-13 (×7): qty 1

## 2016-04-13 MED ORDER — SERTRALINE HCL 50 MG PO TABS
25.0000 mg | ORAL_TABLET | Freq: Every day | ORAL | Status: DC
  Administered 2016-04-14 – 2016-04-16 (×3): 25 mg via ORAL
  Filled 2016-04-13 (×3): qty 1

## 2016-04-13 MED ORDER — DEXTROSE 5 % IV SOLN
500.0000 mg | INTRAVENOUS | Status: DC
  Administered 2016-04-14 – 2016-04-15 (×2): 500 mg via INTRAVENOUS
  Filled 2016-04-13 (×2): qty 500

## 2016-04-13 MED ORDER — SODIUM CHLORIDE 0.9 % IV BOLUS (SEPSIS)
1000.0000 mL | Freq: Once | INTRAVENOUS | Status: AC
  Administered 2016-04-13: 1000 mL via INTRAVENOUS

## 2016-04-13 MED ORDER — CARBIDOPA-LEVODOPA 25-100 MG PO TABS
2.0000 | ORAL_TABLET | Freq: Every day | ORAL | Status: DC
  Administered 2016-04-13 – 2016-04-16 (×4): 2 via ORAL
  Filled 2016-04-13: qty 2
  Filled 2016-04-13: qty 1
  Filled 2016-04-13 (×3): qty 2

## 2016-04-13 MED ORDER — LORATADINE 10 MG PO TABS
10.0000 mg | ORAL_TABLET | Freq: Every day | ORAL | Status: DC
  Administered 2016-04-14 – 2016-04-17 (×4): 10 mg via ORAL
  Filled 2016-04-13 (×4): qty 1

## 2016-04-13 MED ORDER — DEXTROSE 5 % IV SOLN
1.0000 g | Freq: Once | INTRAVENOUS | Status: AC
  Administered 2016-04-13: 1 g via INTRAVENOUS
  Filled 2016-04-13: qty 10

## 2016-04-13 MED ORDER — ACETAMINOPHEN 325 MG PO TABS: 650.0000 mg | ORAL_TABLET | Freq: Four times a day (QID) | ORAL | Status: DC | PRN

## 2016-04-13 MED ORDER — ENOXAPARIN SODIUM 40 MG/0.4ML ~~LOC~~ SOLN: 40.0000 mg | SUBCUTANEOUS | Status: DC

## 2016-04-13 MED ORDER — FAMOTIDINE 20 MG PO TABS
20.0000 mg | ORAL_TABLET | Freq: Every day | ORAL | Status: DC
  Administered 2016-04-14 – 2016-04-17 (×4): 20 mg via ORAL
  Filled 2016-04-13 (×4): qty 1

## 2016-04-13 MED ORDER — ADULT MULTIVITAMIN W/MINERALS CH
1.0000 | ORAL_TABLET | Freq: Every day | ORAL | Status: DC
  Administered 2016-04-14 – 2016-04-17 (×4): 1 via ORAL
  Filled 2016-04-13 (×5): qty 1

## 2016-04-13 MED ORDER — DEXTROSE 5 % IV SOLN
500.0000 mg | Freq: Once | INTRAVENOUS | Status: AC
  Administered 2016-04-13: 500 mg via INTRAVENOUS
  Filled 2016-04-13: qty 500

## 2016-04-13 MED ORDER — MOMETASONE FURO-FORMOTEROL FUM 100-5 MCG/ACT IN AERO
2.0000 | INHALATION_SPRAY | Freq: Two times a day (BID) | RESPIRATORY_TRACT | Status: DC
  Administered 2016-04-13 – 2016-04-17 (×8): 2 via RESPIRATORY_TRACT
  Filled 2016-04-13: qty 8.8

## 2016-04-13 NOTE — Progress Notes (Signed)
PHARMACY NOTE -  ANTIBIOTIC RENAL DOSE ADJUSTMENT    Request received for Pharmacy to assist with antibiotic renal dose adjustment.   Patient has been initiated on Azithromycin and Rocephin for CAP.  Additionally, home Acyclovir 400 mg bid has been continued.  SCr 1.37, estimated CrCl 26 ml/min  Current dosage is appropriate and need for further dosage adjustment appears unlikely at present (R/Z do not require renal adjustment, and Acyclovir would adjust at CrCl < 10 ml/min)  Will sign off at this time.  Please reconsult if a change in clinical status warrants re-evaluation of dosage.  Reuel Boom, PharmD, BCPS Pager: [phone] 04/13/2016, 7:43 PM

## 2016-04-13 NOTE — ED Notes (Signed)
Pt is aware of need for urine sample

## 2016-04-13 NOTE — ED Triage Notes (Signed)
Pt presents to ED with multiple complaints. Pt sts she has been weak, tired, dizzy, nauseous x 3 weeks. Pt sts she got a cold a  Week ago that made everything worse. Pt c/o Worsening SOB since then. Pt A&Ox4. Pt called her PCP and was told to come here for fear of PNA.

## 2016-04-13 NOTE — H&P (Signed)
History and Physical    [name] [id number] DOB: March 07, 1928 DOA: 04/13/2016  Referring MD/NP/PA: [place], [place]  PCP: [name], MD   Patient coming from: Home   Chief Complaint: Dyspnea   HPI: [name] is a 82 y.o. female with known Parkinson's disease, presented with main concern of several weeks duration of progressively worsening dyspnea at rest and with exertion, subjective fevers, chills, cough productive of clear sputum. This has been associated with malaise, poor oral intake. Pt reports she was on ABX recently but seems like she has never gotten much better. No reports of chest pain other than the once present with coughing spells. No abd or urinary concerns.   ED Course: Pt hemodynamically stable, VSS, blood work notable for Cr 1.3, otherwise fairly unremarkable. Imaging studies notable for PNA, pt will be admitted by [facility] for further evaluation and treatment.   Review of Systems:  Constitutional: per HPI HENT: Negative for ear pain, nosebleeds, congestion, facial swelling, rhinorrhea, neck pain, Eyes: Negative for pain, discharge, redness, itching and visual disturbance.  Respiratory: Negative for wheezing and stridor.   Cardiovascular: Negative for chest pain, palpitations and leg swelling.  Gastrointestinal: Negative for abdominal distention.  Genitourinary: Negative for dysuria, urgency, frequency, hematuria, flank pain Musculoskeletal: Negative for back pain, joint swelling, arthralgias and gait problem.  Neurological: Negative for dizziness, tremors, seizures, syncope, facial asymmetry, speech difficulty, weakness, light-headedness, numbness and headaches.  Hematological: Negative for adenopathy. Does not bruise/bleed easily.  Psychiatric/Behavioral: Negative for hallucinations, behavioral problems, confusion  Past Medical History:  Diagnosis Date  . Anxiety   . Asthma   . Depression   . Diabetes mellitus without complication ([place])    [id number].Marland KitchenMarland Kitchenpt denies  . GERD (gastroesophageal reflux disease)   . Hearing loss   . NHL (non-Hodgkin's lymphoma) ([place])    nhl dx 9/04 breast ca dx1/12  . Parkinson disease [facility])     Past Surgical History:  Procedure Laterality Date  . Ba-HA Ear implant    . MASTECTOMY  2 /8/ 12   bilateral  . MASTECTOMY     Social Hx:  reports that she has never smoked. She has never used smokeless tobacco. She reports that she does not drink alcohol or use drugs.  No Known Allergies  Family History  Problem Relation Age of Onset  . Lung cancer Father   . Cancer Father     lung  . Cancer Mother     Prior to Admission medications   Medication Sig Start Date End Date Taking? Authorizing Provider  acetaminophen (TYLENOL) 325 MG tablet Take 650 mg by mouth every 6 (six) hours as needed for moderate pain.    Yes Historical Provider, MD  acyclovir (ZOVIRAX) 400 MG tablet TAKE 1 TABLET BY MOUTH TWICE DAILY 07/08/15  Yes [name], MD  bisacodyl (DULCOLAX) 5 MG EC tablet Take 5 mg by mouth daily as needed for moderate constipation.   Yes Historical Provider, MD  CALCIUM PO Take 600 mg by mouth 2 (two) times daily.   Yes Historical Provider, MD  carbidopa-levodopa (SINEMET IR) 25-100 MG tablet 1 tablet in the morning by mouth, 1 in the afternoon by mouth, 2 in the evening by mouth. 01/14/16  Yes Rebecca S Tat, DO  cetirizine (ZYRTEC) 10 MG tablet Take 10 mg by mouth daily.   Yes Historical Provider, MD  cholecalciferol (VITAMIN D) 1000 units tablet Take 1,000 Units by mouth daily.   Yes Historical Provider, MD  entacapone (COMTAN) 200 MG tablet  TAKE 1 TABLET (200 MG TOTAL) BY MOUTH 3 (THREE) TIMES DAILY. 01/14/16  Yes Rebecca S Tat, DO  Fluticasone-Salmeterol (ADVAIR DISKUS) 100-50 MCG/DOSE AEPB Inhale 1 puff into the lungs every 12 (twelve) hours.    Yes Historical Provider, MD  levofloxacin (LEVAQUIN) 500 MG tablet Take 500 mg by mouth daily.   Yes Historical Provider, MD  mirabegron ER  (MYRBETRIQ) 50 MG TB24 tablet Take 50 mg by mouth daily. Reported on 01/30/2016   Yes Historical Provider, MD  Multiple Vitamins-Minerals (MULTIVITAMIN WITH MINERALS) tablet Take 1 tablet by mouth daily.     Yes Historical Provider, MD  ranitidine (ZANTAC) 300 MG tablet Take 300 mg by mouth at bedtime.    Yes Historical Provider, MD  sertraline (ZOLOFT) 25 MG tablet Take one tablet daily for one week, then take two tablets once daily Patient taking differently: Take 25 mg by mouth daily.  03/16/16  Yes Eustace Quail Tat, DO    Physical Exam: Vitals:   04/13/16 1521 04/13/16 1522 04/13/16 1630 04/13/16 1700  BP: 139/70  137/68 (!) 135/53  Pulse: 80  83 81  Resp: 16  19 23   Temp:      TempSrc:      SpO2: 95%  98% 97%  Weight:  56.2 kg (124 lb)    Height:  5\' 3"  (1.6 m)      Constitutional: NAD, calm, comfortable Vitals:   04/13/16 1521 04/13/16 1522 04/13/16 1630 04/13/16 1700  BP: 139/70  137/68 (!) 135/53  Pulse: 80  83 81  Resp: 16  19 23   Temp:      TempSrc:      SpO2: 95%  98% 97%  Weight:  56.2 kg (124 lb)    Height:  5\' 3"  (1.6 m)     Eyes: PERRL, lids and conjunctivae normal ENMT: Mucous membranes are moist. Posterior pharynx clear of any exudate or lesions.Normal dentition.  Neck: normal, supple, no masses, no thyromegaly Respiratory: rhonchi at bases with diminished breath sounds at bases  Cardiovascular: Regular rate and rhythm, no murmurs / rubs / gallops. No extremity edema. 2+ pedal pulses. No carotid bruits.  Abdomen: no tenderness, no masses palpated. No hepatosplenomegaly. Bowel sounds positive.  Musculoskeletal: no clubbing / cyanosis. No joint deformity upper and lower extremities. Good ROM, no contractures. Normal muscle tone.  Skin: no rashes, lesions, ulcers. No induration Neurologic: CN 2-12 grossly intact. Sensation intact, DTR normal.  Psychiatric: Normal judgment and insight. Alert and oriented x 3. Normal mood.   Labs on Admission: I have personally  reviewed following labs and imaging studies  CBC:  Recent Labs Lab 04/07/16 1054 04/13/16 1356  WBC 4.4 6.9  NEUTROABS 2.9  --   HGB 11.5* 11.2*  HCT 33.6* 32.8*  MCV 88.0 88.9  PLT 164 [id number]   Basic Metabolic Panel:  Recent Labs Lab 04/07/16 1054 04/13/16 1356  NA 143 138  K 4.5 4.4  CL  --  101  CO2 28 29  GLUCOSE 97 138*  BUN 15.8 24*  CREATININE 1.2* 1.31*  CALCIUM 10.0 9.5   Liver Function Tests:  Recent Labs Lab 04/07/16 1054  AST 27  ALT 13  ALKPHOS 170*  BILITOT 0.55  PROT 6.6  ALBUMIN 3.3*   Urine analysis:    Component Value Date/Time   COLORURINE AMBER (A) 04/13/2016 1314   APPEARANCEUR CLEAR 04/13/2016 1314   LABSPEC 1.010 04/13/2016 1314   LABSPEC 1.015 06/05/2008 0907   PHURINE 7.0 04/13/2016 1314   GLUCOSEU  NEGATIVE 04/13/2016 1314   HGBUR NEGATIVE 04/13/2016 1314   BILIRUBINUR NEGATIVE 04/13/2016 1314   BILIRUBINUR Negative 06/05/2008 [place] 04/13/2016 1314   PROTEINUR NEGATIVE 04/13/2016 1314   UROBILINOGEN 0.2 09/20/2012 0117   NITRITE NEGATIVE 04/13/2016 1314   LEUKOCYTESUR NEGATIVE 04/13/2016 1314   LEUKOCYTESUR Negative 06/05/2008 0907   Radiological Exams on Admission: Dg Chest 2 View  Result Date: 04/13/2016 CLINICAL DATA:  Patient states coughing up yellow phlegm. States SOB and overall chest pain "at times". Hx of asthma, diabetes, and bilateral mastectomy. EXAM: CHEST  2 VIEW COMPARISON:  02/07/2015 FINDINGS: Right axillary node dissection. Bilateral mastectomy. Midline trachea. Mild cardiomegaly with aortic atherosclerosis. No pleural effusion or pneumothorax. Right mid and lower lung scarring is similar. Retrocardiac density on the lateral view is new or increased. Not well localized on the frontal. Most likely in the left lower lobe. IMPRESSION: Retrocardiac density on the lateral view is suspicious for infection or aspiration. Favored to be in the left lower lobe, although difficult to localize on the  frontal. Followup PA and lateral chest X-ray is recommended in 3-4 weeks following trial of antibiotic therapy to ensure resolution and exclude underlying malignancy. Aortic atherosclerosis. Electronically Signed   By: Abigail Miyamoto M.D.   On: 04/13/2016 14:55    EKG: pending   Assessment/Plan Active Problems:   CAP (community acquired pneumonia) - LLL PNA, unknown pathogen - admit to tele unit - place on Zithromax and Rocephin  - follow up on sputum cultures, urine legionella and strep pneumo  - narrow ABX as clinically indicated     Acute kidney injury  - pre renal from acute problem - placed on IVF, will repeat BMP in AM    Parkinson's disease - continue home meds        DVT prophylaxis: Lovenox SQ Code Status: Full  Family Communication: Pt updated at bedside Disposition Plan: Home in 2-3 days  Consults called: None Admission status: Inpatient   Faye Ramsay MD [facility] Pager [phone]  If 7PM-7AM, please contact night-coverage [email or web address] Password [facility]  04/13/2016, 6:29 PM

## 2016-04-13 NOTE — ED Provider Notes (Addendum)
[facility] DEPT Provider Note   CSN: [id number] Arrival date & time: 04/13/16  1252     History   Chief Complaint Chief Complaint  Patient presents with  . Dizziness  . Fatigue  . Shortness of Breath    HPI [name] is a 82 y.o. female a 3 week history of fatigue, cough, shortness of breath, intermittent chest pain, intermittent lightheadedness. Patient states she began with cold symptoms 3 weeks ago. She was given antibiotics about a week and a half ago by her primary care provider and still not feeling well. Patient has had some associated nausea postprandially. Patient had one episode of vomiting 2 nights ago. Patient has been coughing up clear phlegm. Patient has not been taking any other medications other than her at home medications.  HPI  Past Medical History:  Diagnosis Date  . Anxiety   . Asthma   . Depression   . Diabetes mellitus without complication ([place])    [id number].Marland KitchenMarland Kitchenpt denies  . GERD (gastroesophageal reflux disease)   . Hearing loss   . NHL (non-Hodgkin's lymphoma) ([place])    nhl dx 9/04 breast ca dx1/12  . Parkinson disease [facility])     Patient Active Problem List   Diagnosis Date Noted  . CAP (community acquired pneumonia) 04/13/2016  . PNA (pneumonia) 04/13/2016  . Murmur 09/02/2015  . Parkinson disease ([place]) 09/02/2015  . Bilateral carotid bruits 09/02/2015  . Confusion 08/07/2015  . Acute encephalopathy 08/07/2015  . UTI (lower urinary tract infection) 08/07/2015  . Acute kidney failure ([place]) 08/07/2015  . Paralysis agitans ([place]) 09/19/2012  . Lymphoma ([place]) 07/03/2011  . Malignant neoplasm of female breast ([place]) 07/03/2011  . Abnormal CT of the chest 03/13/2011  . HYPERTENSION, BENIGN 01/01/2009  . EDEMA 01/01/2009    Past Surgical History:  Procedure Laterality Date  . Ba-HA Ear implant    . MASTECTOMY  2 /8/ 12   bilateral  . MASTECTOMY      OB History    No data available       Home Medications    Prior to  Admission medications   Medication Sig Start Date End Date Taking? Authorizing Provider  acetaminophen (TYLENOL) 325 MG tablet Take 650 mg by mouth every 6 (six) hours as needed for moderate pain.    Yes Historical Provider, MD  acyclovir (ZOVIRAX) 400 MG tablet TAKE 1 TABLET BY MOUTH TWICE DAILY 07/08/15  Yes [name], MD  bisacodyl (DULCOLAX) 5 MG EC tablet Take 5 mg by mouth daily as needed for moderate constipation.   Yes Historical Provider, MD  CALCIUM PO Take 600 mg by mouth 2 (two) times daily.   Yes Historical Provider, MD  carbidopa-levodopa (SINEMET IR) 25-100 MG tablet 1 tablet in the morning by mouth, 1 in the afternoon by mouth, 2 in the evening by mouth. Patient taking differently: 1 tab at 830a, 1 tab at 12p, 2 tabs at 6p 01/14/16  Yes Rebecca S Tat, DO  cetirizine (ZYRTEC) 10 MG tablet Take 10 mg by mouth daily.   Yes Historical Provider, MD  cholecalciferol (VITAMIN D) 1000 units tablet Take 1,000 Units by mouth daily.   Yes Historical Provider, MD  entacapone (COMTAN) 200 MG tablet TAKE 1 TABLET (200 MG TOTAL) BY MOUTH 3 (THREE) TIMES DAILY. Patient taking differently: TAKE 1 TABLET (200 MG TOTAL) BY MOUTH 3 (THREE) TIMES DAILY - take with Sinemet 01/14/16  Yes Rebecca S Tat, DO  Fluticasone-Salmeterol (ADVAIR DISKUS) 100-50 MCG/DOSE AEPB Inhale 1 puff  into the lungs every 12 (twelve) hours.    Yes Historical Provider, MD  levofloxacin (LEVAQUIN) 500 MG tablet Take 500 mg by mouth daily.   Yes Historical Provider, MD  mirabegron ER (MYRBETRIQ) 50 MG TB24 tablet Take 50 mg by mouth daily. Reported on 01/30/2016   Yes Historical Provider, MD  Multiple Vitamins-Minerals (MULTIVITAMIN WITH MINERALS) tablet Take 1 tablet by mouth daily.     Yes Historical Provider, MD  ranitidine (ZANTAC) 300 MG tablet Take 300 mg by mouth at bedtime.    Yes Historical Provider, MD  sertraline (ZOLOFT) 25 MG tablet Take one tablet daily for one week, then take two tablets once daily Patient taking  differently: Take 25 mg by mouth daily.  03/16/16  Yes Eustace Quail Tat, DO    Family History Family History  Problem Relation Age of Onset  . Lung cancer Father   . Cancer Father     lung  . Cancer Mother     Social History Social History  Substance Use Topics  . Smoking status: Never Smoker  . Smokeless tobacco: Never Used     Comment: husband wsa smoker  . Alcohol use No     Comment: no     Allergies   Review of patient's allergies indicates no known allergies.   Review of Systems Review of Systems  Constitutional: Positive for fatigue. Negative for chills and fever.  HENT: Negative for facial swelling and sore throat.   Respiratory: Positive for shortness of breath.   Cardiovascular: Positive for chest pain.  Gastrointestinal: Positive for nausea and vomiting. Negative for abdominal pain.  Genitourinary: Negative for dysuria.  Musculoskeletal: Negative for back pain.  Skin: Negative for rash and wound.  Neurological: Positive for light-headedness. Negative for headaches.  Psychiatric/Behavioral: The patient is not nervous/anxious.      Physical Exam Updated Vital Signs BP 179/74 (BP Location: Left Arm)   Pulse 81   Temp 98.6 F (37 C) (Oral)   Resp 23   Ht 5\' 3"  (1.6 m)   Wt 62.4 kg   SpO2 98%   BMI 24.37 kg/m   Physical Exam  Constitutional: She appears well-developed and well-nourished. No distress.  HENT:  Head: Normocephalic and atraumatic.  Mouth/Throat: Oropharynx is clear and moist. No oropharyngeal exudate.  Eyes: Conjunctivae are normal. Pupils are equal, round, and reactive to light. Right eye exhibits no discharge. Left eye exhibits no discharge. No scleral icterus.  Neck: Normal range of motion. Neck supple. No thyromegaly present.  Cardiovascular: Normal rate, regular rhythm, normal heart sounds and intact distal pulses.  Exam reveals no gallop and no friction rub.   No murmur heard. Pulmonary/Chest: Effort normal. No stridor. No  respiratory distress. She has no wheezes. She has rales (left lung).  Abdominal: Soft. Bowel sounds are normal. She exhibits no distension. There is no tenderness. There is no rebound and no guarding.  Musculoskeletal: She exhibits no edema.  Lymphadenopathy:    She has no cervical adenopathy.  Neurological: She is alert. Coordination normal.  Skin: Skin is warm and dry. No rash noted. She is not diaphoretic. No pallor.  Psychiatric: She has a normal mood and affect.  Nursing note and vitals reviewed.    ED Treatments / Results  Labs (all labs ordered are listed, but only abnormal results are displayed) Labs Reviewed  BASIC METABOLIC PANEL - Abnormal; Notable for the following:       Result Value   Glucose, Bld 138 (*)    BUN  24 (*)    Creatinine, Ser 1.31 (*)    GFR calc non Af Amer 36 (*)    GFR calc Af Amer 41 (*)    All other components within normal limits  CBC - Abnormal; Notable for the following:    RBC 3.69 (*)    Hemoglobin 11.2 (*)    HCT 32.8 (*)    All other components within normal limits  URINALYSIS, ROUTINE W REFLEX MICROSCOPIC (NOT AT [facility]) - Abnormal; Notable for the following:    Color, Urine AMBER (*)    All other components within normal limits  I-STAT CG4 LACTIC ACID, ED - Abnormal; Notable for the following:    Lactic Acid, Venous 2.14 (*)    All other components within normal limits  CULTURE, BLOOD (ROUTINE X 2)  CULTURE, BLOOD (ROUTINE X 2)  CULTURE, BLOOD (ROUTINE X 2)  CULTURE, BLOOD (ROUTINE X 2)  CULTURE, EXPECTORATED SPUTUM-ASSESSMENT  GRAM STAIN  HIV ANTIBODY (ROUTINE TESTING)  STREP PNEUMONIAE URINARY ANTIGEN  CBC  CREATININE, SERUM  LEGIONELLA PNEUMOPHILA SEROGP 1 UR AG  BASIC METABOLIC PANEL  CBC  I-STAT TROPOININ, ED    EKG  EKG Interpretation None       Radiology Dg Chest 2 View  Result Date: 04/13/2016 CLINICAL DATA:  Patient states coughing up yellow phlegm. States SOB and overall chest pain "at times". Hx of asthma,  diabetes, and bilateral mastectomy. EXAM: CHEST  2 VIEW COMPARISON:  02/07/2015 FINDINGS: Right axillary node dissection. Bilateral mastectomy. Midline trachea. Mild cardiomegaly with aortic atherosclerosis. No pleural effusion or pneumothorax. Right mid and lower lung scarring is similar. Retrocardiac density on the lateral view is new or increased. Not well localized on the frontal. Most likely in the left lower lobe. IMPRESSION: Retrocardiac density on the lateral view is suspicious for infection or aspiration. Favored to be in the left lower lobe, although difficult to localize on the frontal. Followup PA and lateral chest X-ray is recommended in 3-4 weeks following trial of antibiotic therapy to ensure resolution and exclude underlying malignancy. Aortic atherosclerosis. Electronically Signed   By: Abigail Miyamoto M.D.   On: 04/13/2016 14:55    Procedures Procedures (including critical care time)  Medications Ordered in ED Medications  mometasone-formoterol (DULERA) 100-5 MCG/ACT inhaler 2 puff (not administered)  multivitamin with minerals tablet 1 tablet (not administered)  famotidine (PEPCID) tablet 20 mg (not administered)  acetaminophen (TYLENOL) tablet 650 mg (not administered)  bisacodyl (DULCOLAX) EC tablet 5 mg (not administered)  acyclovir (ZOVIRAX) tablet 400 mg (not administered)  loratadine (CLARITIN) tablet 10 mg (not administered)  cholecalciferol (VITAMIN D) tablet 1,000 Units (not administered)  mirabegron ER (MYRBETRIQ) tablet 50 mg (not administered)  sertraline (ZOLOFT) tablet 25 mg (not administered)  carbidopa-levodopa (SINEMET IR) 25-100 MG per tablet immediate release 1 tablet (not administered)  calcium carbonate (OS-CAL - dosed in mg of elemental calcium) tablet 500 mg of elemental calcium (not administered)  entacapone (COMTAN) tablet 200 mg (not administered)  cefTRIAXone (ROCEPHIN) 1 g in dextrose 5 % 50 mL IVPB (not administered)  azithromycin (ZITHROMAX) 500 mg  in dextrose 5 % 250 mL IVPB (not administered)  0.9 %  sodium chloride infusion ( Intravenous New Bag/Given 04/13/16 1905)  enoxaparin (LOVENOX) injection 40 mg (not administered)  sodium chloride 0.9 % bolus 1,000 mL (0 mLs Intravenous Stopped 04/13/16 1904)  cefTRIAXone (ROCEPHIN) 1 g in dextrose 5 % 50 mL IVPB (0 g Intravenous Stopped 04/13/16 1726)  azithromycin (ZITHROMAX) 500 mg in dextrose 5 %  250 mL IVPB (0 mg Intravenous Stopped 04/13/16 1904)     Initial Impression / Assessment and Plan / ED Course  I have reviewed the triage vital signs and the nursing notes.  Pertinent labs & imaging results that were available during my care of the patient were reviewed by me and considered in my medical decision making (see chart for details).  Clinical Course   CBC shows stable chronic anemia, hemoglobin 11.2. BMP shows BUN 24, creatinine 1.31, glucose 138. Lactate 2.14. Troponin 0.02. UA negative. CXR shows retrocardiac density on the lateral view suspicious for infection or aspiration; favored to be in the left lower lobe, although difficult to localize on the frontal. Rocephin and azithromycin given in ED with fluid bolus.I consulted [facility] and spoke with Dr. Doyle Askew who will admit the patient for further evaluation and treatment. Patient also evaluated by Dr. Ellender Hose who guided the patient's management and agrees with plan.   Final Clinical Impressions(s) / ED Diagnoses   Final diagnoses:  CAP (community acquired pneumonia)    New Prescriptions Current Discharge Medication List       Frederica Kuster, Hershal Coria 04/13/16 2013    Duffy Bruce, MD 04/14/16 0813    Frederica Kuster, PA-C 06/01/16 [place], MD 06/01/16 1737

## 2016-04-14 LAB — CBC
HEMATOCRIT: 29.5 % — AB (ref 36.0–46.0)
HEMOGLOBIN: 10.1 g/dL — AB (ref 12.0–15.0)
MCH: 29.6 pg (ref 26.0–34.0)
MCHC: 34.2 g/dL (ref 30.0–36.0)
MCV: 86.5 fL (ref 78.0–100.0)
Platelets: 207 10*3/uL (ref 150–400)
RBC: 3.41 MIL/uL — AB (ref 3.87–5.11)
RDW: 12.1 % (ref 11.5–15.5)
WBC: 5 10*3/uL (ref 4.0–10.5)

## 2016-04-14 LAB — BASIC METABOLIC PANEL
ANION GAP: 6 (ref 5–15)
BUN: 17 mg/dL (ref 6–20)
CHLORIDE: 112 mmol/L — AB (ref 101–111)
CO2: 26 mmol/L (ref 22–32)
Calcium: 8.4 mg/dL — ABNORMAL LOW (ref 8.9–10.3)
Creatinine, Ser: 1.1 mg/dL — ABNORMAL HIGH (ref 0.44–1.00)
GFR calc Af Amer: 51 mL/min — ABNORMAL LOW (ref >60)
GFR, EST NON AFRICAN AMERICAN: 44 mL/min — AB (ref >60)
Glucose, Bld: 92 mg/dL (ref 65–99)
POTASSIUM: 4.3 mmol/L (ref 3.5–5.1)
SODIUM: 144 mmol/L (ref 135–145)

## 2016-04-14 LAB — HIV ANTIBODY (ROUTINE TESTING W REFLEX): HIV Screen 4th Generation wRfx: NONREACTIVE

## 2016-04-14 LAB — STREP PNEUMONIAE URINARY ANTIGEN: STREP PNEUMO URINARY ANTIGEN: NEGATIVE

## 2016-04-14 MED ORDER — ALPRAZOLAM 0.25 MG PO TABS
0.2500 mg | ORAL_TABLET | Freq: Once | ORAL | Status: AC
  Administered 2016-04-14: 0.25 mg via ORAL
  Filled 2016-04-14: qty 1

## 2016-04-14 MED ORDER — ALPRAZOLAM 1 MG PO TABS
1.0000 mg | ORAL_TABLET | Freq: Once | ORAL | Status: DC
Start: 2016-04-14 — End: 2016-04-14

## 2016-04-14 MED ORDER — HYDRALAZINE HCL 20 MG/ML IJ SOLN
10.0000 mg | Freq: Once | INTRAMUSCULAR | Status: AC
  Administered 2016-04-14: 10 mg via INTRAVENOUS
  Filled 2016-04-14: qty 1

## 2016-04-14 NOTE — Progress Notes (Signed)
Patient ID: [name], female   DOB: 10/03/1927, 82 y.o.   MRN: [id number]     PROGRESS NOTE    [name]  [id number] DOB: 06-15-1928 DOA: 04/13/2016  PCP: [name], MD   Brief Narrative:  82 y.o. female with known Parkinson's disease, presented with main concern of several weeks duration of progressively worsening dyspnea at rest and with exertion, subjective fevers, chills, cough productive of clear sputum. This has been associated with malaise, poor oral intake. Pt reports she was on ABX recently but seems like she has never gotten much better. No reports of chest pain other than the once present with coughing spells. No abd or urinary concerns.   ED Course: Pt hemodynamically stable, VSS, blood work notable for Cr 1.3, otherwise fairly unremarkable. Imaging studies notable for PNA, pt will be [facility] by [facility] for further evaluation and treatment.   Assessment & Plan:   Active Problems:   CAP (community acquired pneumonia) - LLL PNA, unknown pathogen at this time  - started on Rocephin and Zithromax, will continue same regimen for now day #2 - follow up on sputum cultures, urine legionella and strep pneumo  - narrow ABX to oral in AM if pt clinically improving     Acute kidney injury  - pre renal from acute problem - placed on IVF, Cr is trending down - encouraged oral intake  - BMP in AM    Parkinson's disease - continue home meds   DVT prophylaxis: Lovenox SQ Code Status: Full  Family Communication: Patient at bedside  Disposition Plan: Home in 1-2 days   Consultants:   None  Procedures:   None  Antimicrobials:   Zithromax 9/18 -->  Rocephin 9/18 -->  Subjective: No events overnight.   Objective: Vitals:   04/14/16 0524 04/14/16 0642 04/14/16 1021 04/14/16 1024  BP: (!) 191/67 (!) 153/57    Pulse: 75 85  85  Resp: (!) 23   (!) 26  Temp: 98.7 F (37.1 C)     TempSrc: Oral     SpO2: 97%  97%   Weight:      Height:         Intake/Output Summary (Last 24 hours) at 04/14/16 1024 Last data filed at 04/14/16 0700  Gross per 24 hour  Intake          2393.75 ml  Output                0 ml  Net          2393.75 ml   Filed Weights   04/13/16 1522 04/13/16 1933  Weight: 56.2 kg (124 lb) 62.4 kg (137 lb 9.1 oz)    Examination:  General exam: Appears calm and comfortable  Respiratory system: Respiratory effort normal. Diminished breath sounds at bases Cardiovascular system: S1 & S2 heard, RRR. No JVD, SEM 4/6, no rubs, gallops or clicks. No pedal edema. Gastrointestinal system: Abdomen is nondistended, soft and nontender. No organomegaly or masses felt. Normal bowel sounds heard. Central nervous system: Alert and oriented. No focal neurological deficits. Extremities: Symmetric 5 x 5 power.  Data Reviewed: I have personally reviewed following labs and imaging studies  CBC:  Recent Labs Lab 04/07/16 1054 04/13/16 1356 04/14/16 0516  WBC 4.4 6.9 5.0  NEUTROABS 2.9  --   --   HGB 11.5* 11.2* 10.1*  HCT 33.6* 32.8* 29.5*  MCV 88.0 88.9 86.5  PLT 164 251 [id number]   Basic Metabolic Panel:  Recent Labs Lab  04/07/16 1054 04/13/16 1356 04/14/16 0516  NA 143 138 144  K 4.5 4.4 4.3  CL  --  101 112*  CO2 28 29 26   GLUCOSE 97 138* 92  BUN 15.8 24* 17  CREATININE 1.2* 1.31* 1.10*  CALCIUM 10.0 9.5 8.4*   Liver Function Tests:  Recent Labs Lab 04/07/16 1054  AST 27  ALT 13  ALKPHOS 170*  BILITOT 0.55  PROT 6.6  ALBUMIN 3.3*   Urine analysis: clear   Recent Results (from the past 240 hour(s))  Culture, blood (Routine X 2) w Reflex to ID Panel     Status: None (Preliminary result)   Collection Time: 04/13/16  2:30 PM  Result Value Ref Range Status   Specimen Description BLOOD RIGHT ANTECUBITAL  Final   Special Requests BOTTLES DRAWN AEROBIC AND ANAEROBIC 5CC EACH  Final   Culture   Final    NO GROWTH < 24 HOURS Performed at [facility]    Report Status PENDING  Incomplete    Culture, blood (Routine X 2) w Reflex to ID Panel     Status: None (Preliminary result)   Collection Time: 04/13/16  3:03 PM  Result Value Ref Range Status   Specimen Description BLOOD LEFT FOREARM  Final   Special Requests BOTTLES DRAWN AEROBIC ONLY [place]  Final   Culture   Final    NO GROWTH < 24 HOURS Performed at [facility]    Report Status PENDING  Incomplete     Radiology Studies: Dg Chest 2 View  Result Date: 04/13/2016 CLINICAL DATA:  Patient states coughing up yellow phlegm. States SOB and overall chest pain "at times". Hx of asthma, diabetes, and bilateral mastectomy. EXAM: CHEST  2 VIEW COMPARISON:  02/07/2015 FINDINGS: Right axillary node dissection. Bilateral mastectomy. Midline trachea. Mild cardiomegaly with aortic atherosclerosis. No pleural effusion or pneumothorax. Right mid and lower lung scarring is similar. Retrocardiac density on the lateral view is new or increased. Not well localized on the frontal. Most likely in the left lower lobe. IMPRESSION: Retrocardiac density on the lateral view is suspicious for infection or aspiration. Favored to be in the left lower lobe, although difficult to localize on the frontal. Followup PA and lateral chest X-ray is recommended in 3-4 weeks following trial of antibiotic therapy to ensure resolution and exclude underlying malignancy. Aortic atherosclerosis. Electronically Signed   By: Abigail Miyamoto M.D.   On: 04/13/2016 14:55    Scheduled Meds: . acyclovir  400 mg Oral BID  . azithromycin  500 mg Intravenous Q24H  . calcium carbonate  500 mg of elemental calcium Oral BID  . carbidopa-levodopa  1 tablet Oral 2 times per day  . carbidopa-levodopa  2 tablet Oral q1800  . cefTRIAXone (ROCEPHIN)  IV  1 g Intravenous Q24H  . cholecalciferol  1,000 Units Oral Daily  . enoxaparin (LOVENOX) injection  40 mg Subcutaneous Q24H  . entacapone  200 mg Oral 3 times per day  . famotidine  20 mg Oral Daily  . loratadine  10 mg Oral Daily   . mirabegron ER  50 mg Oral Daily  . mometasone-formoterol  2 puff Inhalation BID  . multivitamin with minerals  1 tablet Oral Daily  . sertraline  25 mg Oral QHS   Continuous Infusions: . sodium chloride 75 mL/hr at 04/14/16 0602     LOS: 1 day   Time spent: 20 minutes  Faye Ramsay, MD [facility] Pager [phone]  If 7PM-7AM, please contact night-coverage  [email or web address] Password [facility] 04/14/2016, 10:24 AM

## 2016-04-15 ENCOUNTER — Ambulatory Visit: Payer: Medicare Other

## 2016-04-15 ENCOUNTER — Ambulatory Visit: Payer: Medicare Other | Admitting: Physical Therapy

## 2016-04-15 LAB — CBC
HEMATOCRIT: 30.8 % — AB (ref 36.0–46.0)
Hemoglobin: 10.3 g/dL — ABNORMAL LOW (ref 12.0–15.0)
MCH: 29.6 pg (ref 26.0–34.0)
MCHC: 33.4 g/dL (ref 30.0–36.0)
MCV: 88.5 fL (ref 78.0–100.0)
PLATELETS: 222 10*3/uL (ref 150–400)
RBC: 3.48 MIL/uL — ABNORMAL LOW (ref 3.87–5.11)
RDW: 12.5 % (ref 11.5–15.5)
WBC: 5.4 10*3/uL (ref 4.0–10.5)

## 2016-04-15 LAB — BASIC METABOLIC PANEL
ANION GAP: 7 (ref 5–15)
BUN: 13 mg/dL (ref 6–20)
CALCIUM: 9.4 mg/dL (ref 8.9–10.3)
CO2: 26 mmol/L (ref 22–32)
CREATININE: 1 mg/dL (ref 0.44–1.00)
Chloride: 110 mmol/L (ref 101–111)
GFR, EST AFRICAN AMERICAN: 57 mL/min — AB (ref >60)
GFR, EST NON AFRICAN AMERICAN: 50 mL/min — AB (ref >60)
GLUCOSE: 98 mg/dL (ref 65–99)
Potassium: 4 mmol/L (ref 3.5–5.1)
Sodium: 143 mmol/L (ref 135–145)

## 2016-04-15 MED ORDER — DOXYCYCLINE HYCLATE 100 MG PO TABS
100.0000 mg | ORAL_TABLET | Freq: Two times a day (BID) | ORAL | Status: DC
  Administered 2016-04-16 – 2016-04-17 (×3): 100 mg via ORAL
  Filled 2016-04-15 (×3): qty 1

## 2016-04-15 MED ORDER — HYDRALAZINE HCL 20 MG/ML IJ SOLN
10.0000 mg | Freq: Once | INTRAMUSCULAR | Status: AC
Start: 2016-04-15 — End: 2016-04-15
  Administered 2016-04-15: 10 mg via INTRAVENOUS
  Filled 2016-04-15: qty 1

## 2016-04-15 NOTE — Progress Notes (Signed)
Physical Therapy Treatment Patient Details Name: [name] MRN: [id number] DOB: [birth year] Today's Date: 04/15/2016    History of Present Illness Pt is an 82 y/o female with PMHx of Parkinson's Disease admitted for pneumonia.     PT Comments    Pt admitted with above. Pt currently with functinal limitations due to the deficits listed below (see PT Problem List). Pt will benefit from skilled PT to increase their independence and safety with mobility to allow discharge. Pt was able to ambulate and perform all other mobility with min guard. During gait, pt had 3 LOB episodes, but was able to regain balance using rolling walker. Pt was agreeable to PT and student suggestion of utilizing rollator once discharged home until she feels as though her weakness and balance deficits have resolved. Spouse reports intermittent follow-up with outpatient neuro PT for management of Parkinson's symptoms.   Follow Up Recommendations  Outpatient PT     Equipment Recommendations  None recommended by PT    Recommendations for Other Services       Precautions / Restrictions Precautions Precautions: Fall Restrictions Weight Bearing Restrictions: No    Mobility  Bed Mobility Overal bed mobility: Needs Assistance Bed Mobility: Supine to Sit;Sit to Supine     Supine to sit: Min guard Sit to supine: Min guard   General bed mobility comments: guarding for safety  Transfers Overall transfer level: Needs assistance Equipment used: Rolling walker (2 wheeled) Transfers: Sit to/from Stand Sit to Stand: Min guard         General transfer comment: guarding for safety; verbal cues for UE positioning  Ambulation/Gait Ambulation/Gait assistance: Min guard Ambulation Distance (Feet): 220 Feet Assistive device: Rolling walker (2 wheeled) Gait Pattern/deviations: Step-through pattern;Decreased stride length     General Gait Details: guarding for safety due to 3 LOB episodes during gait (pt  able to self-correct); verbal cues for RW positioning   Stairs            Wheelchair Mobility    Modified Rankin (Stroke Patients Only)       Balance Overall balance assessment: Needs assistance         Standing balance support: During functional activity;Bilateral upper extremity supported (using RW) Standing balance-Leahy Scale: Poor Standing balance comment: pt had 3 LOB episodes during gait; was able to regain balance with assist from RW                    Cognition Arousal/Alertness: Awake/alert Behavior During Therapy: [facility] for tasks assessed/performed Overall Cognitive Status: Within Functional Limits for tasks assessed                      Exercises      General Comments        Pertinent Vitals/Pain Pain Assessment: No/denies pain    Home Living Family/patient expects to be discharged to:: Private residence Living Arrangements: Spouse/significant other Available Help at Discharge: Family;Available 24 hours/day Type of Home: House Home Access: Stairs to enter   Home Layout: One level Home Equipment: Environmental consultant - 4 wheels;Cane - single point      Prior Function Level of Independence: Independent      Comments: pt participates in outpatient neuro therapy for management of her Parkinson's symptoms   PT Goals (current goals can now be found in the care plan section) Acute Rehab PT Goals PT Goal Formulation: With patient Time For Goal Achievement: 04/22/16 Potential to Achieve Goals: Good    Frequency  Min 3X/week      PT Plan      Co-evaluation             End of Session Equipment Utilized During Treatment: Gait belt Activity Tolerance: Patient tolerated treatment well Patient left: in bed;with call bell/phone within reach;with family/visitor present;with bed alarm set     Time: 1342-1401 PT Time Calculation (min) (ACUTE ONLY): 19 min  Charges:                       G CodesDewitt Hoes 2016/04/16,  4:15 PM Dewitt Hoes, SPT

## 2016-04-15 NOTE — Progress Notes (Addendum)
On call paged about hypertension using both automatic and manuel blood pressure attempts. Pt is asymptomatic. Will continue to monitor. Hydralazine 10mg  IV ordered.

## 2016-04-15 NOTE — Progress Notes (Signed)
Patient ID: [name], female   DOB: 16-Jun-1928, 82 y.o.   MRN: [id number]     PROGRESS NOTE    [name]  [id number] DOB: September 14, 1927 DOA: 04/13/2016  PCP: [name], MD   Brief Narrative:  82 y.o. female with known Parkinson's disease, presented with main concern of several weeks duration of progressively worsening dyspnea at rest and with exertion, subjective fevers, chills, cough productive of clear sputum. This has been associated with malaise, poor oral intake. Pt reports she was on ABX recently but seems like she has never gotten much better. No reports of chest pain other than the once present with coughing spells. No abd or urinary concerns.   ED Course: Pt hemodynamically stable, VSS, blood work notable for Cr 1.3, otherwise fairly unremarkable. Imaging studies notable for PNA, pt will be admitted by [facility] for further evaluation and treatment.   Assessment & Plan:   Active Problems:   CAP (community acquired pneumonia) - LLL PNA, unknown pathogen at this time  - started on Rocephin and Zithromax, will continue same regimen for now day #3 - follow up on sputum cultures, urine legionella and strep pneumo  - narrow ABX to oral in AM     Acute kidney injury  - pre renal from acute problem - placed on IVF, Cr is trending down and is WNL this AM    Parkinson's disease - continue home meds  - PT eval done today, outpatient PT recommended   DVT prophylaxis: Lovenox SQ Code Status: Full  Family Communication: Patient, husband at bedside, rounding team with myself, RN, pharmacist, case manager all present  Disposition Plan: Home in 1-2 days   Consultants:   None  Procedures:   None  Antimicrobials:   Zithromax 9/18 -->  Rocephin 9/18 -->  Subjective: No events overnight.   Objective: Vitals:   04/15/16 0454 04/15/16 0500 04/15/16 0650 04/15/16 1326  BP: (!) 203/87 (!) 203/85 (!) 155/73 (!) 148/64  Pulse: 88   94  Resp: 18   18    Temp: 98.6 F (37 C)   97.9 F (36.6 C)  TempSrc: Oral   Oral  SpO2: 96%   97%  Weight:      Height:        Intake/Output Summary (Last 24 hours) at 04/15/16 1822 Last data filed at 04/15/16 1545  Gross per 24 hour  Intake           1242.5 ml  Output                0 ml  Net           1242.5 ml   Filed Weights   04/13/16 1522 04/13/16 1933  Weight: 56.2 kg (124 lb) 62.4 kg (137 lb 9.1 oz)    Examination:  General exam: Appears calm and comfortable  Respiratory system: Respiratory effort normal. Diminished breath sounds at bases Cardiovascular system: S1 & S2 heard, RRR. No JVD, SEM 4/6, no rubs, gallops or clicks. No pedal edema. Gastrointestinal system: Abdomen is nondistended, soft and nontender. No organomegaly or masses felt. Normal bowel sounds heard. Central nervous system: Alert and oriented. No focal neurological deficits. Extremities: Symmetric 5 x 5 power.  Data Reviewed: I have personally reviewed following labs and imaging studies  CBC:  Recent Labs Lab 04/13/16 1356 04/14/16 0516 04/15/16 0508  WBC 6.9 5.0 5.4  HGB 11.2* 10.1* 10.3*  HCT 32.8* 29.5* 30.8*  MCV 88.9 86.5 88.5  PLT 251 207 222  Basic Metabolic Panel:  Recent Labs Lab 04/13/16 1356 04/14/16 0516 04/15/16 0508  NA 138 144 143  K 4.4 4.3 4.0  CL 101 112* 110  CO2 29 26 26   GLUCOSE 138* 92 98  BUN 24* 17 13  CREATININE 1.31* 1.10* 1.00  CALCIUM 9.5 8.4* 9.4   Urine analysis: clear   Recent Results (from the past 240 hour(s))  Culture, blood (Routine X 2) w Reflex to ID Panel     Status: None (Preliminary result)   Collection Time: 04/13/16  2:30 PM  Result Value Ref Range Status   Specimen Description BLOOD RIGHT ANTECUBITAL  Final   Special Requests BOTTLES DRAWN AEROBIC AND ANAEROBIC 5CC EACH  Final   Culture   Final    NO GROWTH 2 DAYS Performed at [facility]    Report Status PENDING  Incomplete  Culture, blood (Routine X 2) w Reflex to ID Panel      Status: None (Preliminary result)   Collection Time: 04/13/16  3:03 PM  Result Value Ref Range Status   Specimen Description BLOOD LEFT FOREARM  Final   Special Requests BOTTLES DRAWN AEROBIC ONLY [place]  Final   Culture   Final    NO GROWTH 2 DAYS Performed at [facility]    Report Status PENDING  Incomplete     Radiology Studies: No results found.  Scheduled Meds: . acyclovir  400 mg Oral BID  . azithromycin  500 mg Intravenous Q24H  . calcium carbonate  500 mg of elemental calcium Oral BID  . carbidopa-levodopa  1 tablet Oral 2 times per day  . carbidopa-levodopa  2 tablet Oral q1800  . cefTRIAXone (ROCEPHIN)  IV  1 g Intravenous Q24H  . cholecalciferol  1,000 Units Oral Daily  . enoxaparin (LOVENOX) injection  40 mg Subcutaneous Q24H  . entacapone  200 mg Oral 3 times per day  . famotidine  20 mg Oral Daily  . loratadine  10 mg Oral Daily  . mometasone-formoterol  2 puff Inhalation BID  . multivitamin with minerals  1 tablet Oral Daily  . sertraline  25 mg Oral QHS   Continuous Infusions: . sodium chloride 75 mL/hr at 04/14/16 0602     LOS: 2 days   Time spent: 20 minutes  Faye Ramsay, MD [facility] Pager [phone]  If 7PM-7AM, please contact night-coverage [email or web address] Password [facility] 04/15/2016, 6:22 PM

## 2016-04-16 ENCOUNTER — Other Ambulatory Visit: Payer: Self-pay

## 2016-04-16 LAB — BASIC METABOLIC PANEL
ANION GAP: 7 (ref 5–15)
BUN: 11 mg/dL (ref 6–20)
CHLORIDE: 107 mmol/L (ref 101–111)
CO2: 30 mmol/L (ref 22–32)
Calcium: 9.9 mg/dL (ref 8.9–10.3)
Creatinine, Ser: 0.92 mg/dL (ref 0.44–1.00)
GFR calc Af Amer: >60 mL/min (ref >60)
GFR calc non Af Amer: 55 mL/min — ABNORMAL LOW (ref >60)
GLUCOSE: 87 mg/dL (ref 65–99)
POTASSIUM: 3.9 mmol/L (ref 3.5–5.1)
Sodium: 144 mmol/L (ref 135–145)

## 2016-04-16 LAB — CBC
HEMATOCRIT: 32.3 % — AB (ref 36.0–46.0)
Hemoglobin: 10.9 g/dL — ABNORMAL LOW (ref 12.0–15.0)
MCH: 29.9 pg (ref 26.0–34.0)
MCHC: 33.7 g/dL (ref 30.0–36.0)
MCV: 88.7 fL (ref 78.0–100.0)
Platelets: 255 10*3/uL (ref 150–400)
RBC: 3.64 MIL/uL — AB (ref 3.87–5.11)
RDW: 12.4 % (ref 11.5–15.5)
WBC: 5.2 10*3/uL (ref 4.0–10.5)

## 2016-04-16 MED ORDER — LORAZEPAM 1 MG PO TABS: 1.0000 mg | ORAL_TABLET | Freq: Once | ORAL | Status: DC

## 2016-04-16 MED ORDER — HYDRALAZINE HCL 20 MG/ML IJ SOLN
10.0000 mg | Freq: Once | INTRAMUSCULAR | Status: AC
  Administered 2016-04-16: 10 mg via INTRAVENOUS
  Filled 2016-04-16: qty 1

## 2016-04-16 NOTE — Progress Notes (Signed)
Patient complains of chest at shift change. EKG obtained. MD notified. VSS. 118/62 O2 96% P 94. Patient states she feel better now.

## 2016-04-16 NOTE — Progress Notes (Signed)
Patient ID: [name], female   DOB: 1928/06/25, 82 y.o.   MRN: [id number]     PROGRESS NOTE    [name]  [id number] DOB: [birth year] DOA: 04/13/2016  PCP: [name], MD   Brief Narrative:  82 y.o. female with known Parkinson's disease, presented with main concern of several weeks duration of progressively worsening dyspnea at rest and with exertion, subjective fevers, chills, cough productive of clear sputum. This has been associated with malaise, poor oral intake. Pt reports she was on ABX recently but seems like she has never gotten much better. No reports of chest pain other than the once present with coughing spells. No abd or urinary concerns.   ED Course: Pt hemodynamically stable, VSS, blood work notable for Cr 1.3, otherwise fairly unremarkable. Imaging studies notable for PNA, pt will be admitted by [facility] for further evaluation and treatment.   Assessment & Plan:   Active Problems:   CAP (community acquired pneumonia) - LLL PNA, unknown pathogen at this time  - started on Rocephin and Zithromax, will transition to oral Doxycycline  - follow up on sputum cultures, urine legionella and strep pneumo  - if pt doing ok on oral ABX, can likely go home     Acute kidney injury  - pre renal from acute problem - placed on IVF, Cr is trending down and is WNL this AM    Parkinson's disease - continue home meds  - PT eval done today, outpatient PT recommended   DVT prophylaxis: Lovenox SQ Code Status: Full  Family Communication: Patient, husband at bedside, rounding team with myself, RN, pharmacist, case manager all present  Disposition Plan: Home in am  Consultants:   None  Procedures:   None  Antimicrobials:   Zithromax 9/18 --> 9/21  Rocephin 9/18 --> 9/21  Doxycycline 9/21 -->   Subjective: No events overnight.   Objective: Vitals:   04/16/16 0225 04/16/16 0604 04/16/16 0651 04/16/16 1227  BP: (!) 161/87 (!) 191/84 (!) 142/66  (!) 127/50  Pulse: 85 85 94 90  Resp:  18  18  Temp:  98.4 F (36.9 C)  98.2 F (36.8 C)  TempSrc:  Oral  Oral  SpO2:  98%  96%  Weight:      Height:        Intake/Output Summary (Last 24 hours) at 04/16/16 1328 Last data filed at 04/16/16 1100  Gross per 24 hour  Intake              650 ml  Output                0 ml  Net              650 ml   Filed Weights   04/13/16 1522 04/13/16 1933  Weight: 56.2 kg (124 lb) 62.4 kg (137 lb 9.1 oz)    Examination:  General exam: Appears calm and comfortable  Respiratory system: Respiratory effort normal. Diminished breath sounds at bases Cardiovascular system: S1 & S2 heard, RRR. No JVD, SEM 4/6, no rubs, gallops or clicks. No pedal edema. Gastrointestinal system: Abdomen is nondistended, soft and nontender. No organomegaly or masses felt. Normal bowel sounds heard. Central nervous system: Alert and oriented. No focal neurological deficits. Extremities: Symmetric 5 x 5 power.  Data Reviewed: I have personally reviewed following labs and imaging studies  CBC:  Recent Labs Lab 04/13/16 1356 04/14/16 0516 04/15/16 0508 04/16/16 0550  WBC 6.9 5.0 5.4 5.2  HGB 11.2* 10.1*  10.3* 10.9*  HCT 32.8* 29.5* 30.8* 32.3*  MCV 88.9 86.5 88.5 88.7  PLT 251 207 222 [id number]   Basic Metabolic Panel:  Recent Labs Lab 04/13/16 1356 04/14/16 0516 04/15/16 0508 04/16/16 0550  NA 138 144 143 144  K 4.4 4.3 4.0 3.9  CL 101 112* 110 107  CO2 29 26 26 30   GLUCOSE 138* 92 98 87  BUN 24* 17 13 11   CREATININE 1.31* 1.10* 1.00 0.92  CALCIUM 9.5 8.4* 9.4 9.9   Urine analysis: clear   Recent Results (from the past 240 hour(s))  Culture, blood (Routine X 2) w Reflex to ID Panel     Status: None (Preliminary result)   Collection Time: 04/13/16  2:30 PM  Result Value Ref Range Status   Specimen Description BLOOD RIGHT ANTECUBITAL  Final   Special Requests BOTTLES DRAWN AEROBIC AND ANAEROBIC 5CC EACH  Final   Culture   Final    NO GROWTH 2  DAYS Performed at [facility]    Report Status PENDING  Incomplete  Culture, blood (Routine X 2) w Reflex to ID Panel     Status: None (Preliminary result)   Collection Time: 04/13/16  3:03 PM  Result Value Ref Range Status   Specimen Description BLOOD LEFT FOREARM  Final   Special Requests BOTTLES DRAWN AEROBIC ONLY [place]  Final   Culture   Final    NO GROWTH 2 DAYS Performed at [facility]    Report Status PENDING  Incomplete     Radiology Studies: No results found.  Scheduled Meds: . acyclovir  400 mg Oral BID  . calcium carbonate  500 mg of elemental calcium Oral BID  . carbidopa-levodopa  1 tablet Oral 2 times per day  . carbidopa-levodopa  2 tablet Oral q1800  . cholecalciferol  1,000 Units Oral Daily  . doxycycline  100 mg Oral Q12H  . enoxaparin (LOVENOX) injection  40 mg Subcutaneous Q24H  . entacapone  200 mg Oral 3 times per day  . famotidine  20 mg Oral Daily  . loratadine  10 mg Oral Daily  . mometasone-formoterol  2 puff Inhalation BID  . multivitamin with minerals  1 tablet Oral Daily  . sertraline  25 mg Oral QHS   Continuous Infusions:     LOS: 3 days   Time spent: 20 minutes  Faye Ramsay, MD [facility] Pager [phone]  If 7PM-7AM, please contact night-coverage [email or web address] Password [email or web address] 04/16/2016, 1:28 PM

## 2016-04-16 NOTE — Care Management Note (Signed)
Case Management Note  Patient Details  Name: [name] MRN: [id number] Date of Birth: 1927/11/06  Subjective/Objective:   PT recc otpt PT-MD has already written manual script-placed in shadow chart. No further CM needs or orders.                Action/Plan:d/c home.   Expected Discharge Date:   (unknown)               Expected Discharge Plan:  Home/Self Care  In-House Referral:     Discharge planning Services  CM Consult  Post Acute Care Choice:    Choice offered to:     DME Arranged:    DME Agency:     HH Arranged:    HH Agency:     Status of Service:  In process, will continue to follow  If discussed at Long Length of Stay Meetings, dates discussed:    Additional Comments:  Dessa Phi, RN 04/16/2016, 10:57 AM

## 2016-04-17 ENCOUNTER — Ambulatory Visit: Payer: Medicare Other | Admitting: Physical Therapy

## 2016-04-17 ENCOUNTER — Ambulatory Visit: Payer: Medicare Other | Attending: Physical Medicine and Rehabilitation

## 2016-04-17 LAB — LEGIONELLA PNEUMOPHILA SEROGP 1 UR AG: L. pneumophila Serogp 1 Ur Ag: NEGATIVE

## 2016-04-17 MED ORDER — HYDRALAZINE HCL 20 MG/ML IJ SOLN
10.0000 mg | Freq: Once | INTRAMUSCULAR | Status: AC
  Administered 2016-04-17: 10 mg via INTRAVENOUS
  Filled 2016-04-17: qty 1

## 2016-04-17 MED ORDER — HYDRALAZINE HCL 10 MG PO TABS: 10.0000 mg | ORAL_TABLET | Freq: Two times a day (BID) | ORAL | 0 refills | Status: DC | PRN

## 2016-04-17 MED ORDER — DOXYCYCLINE HYCLATE 100 MG PO TABS: 100.0000 mg | ORAL_TABLET | Freq: Two times a day (BID) | ORAL | 0 refills | Status: AC

## 2016-04-17 NOTE — Care Management Important Message (Signed)
Important Message  Patient Details  Name: [name] MRN: [id number] Date of Birth: 14-Apr-1928   Medicare Important Message Given:  Yes    Camillo Flaming 04/17/2016, 9:25 AMImportant Message  Patient Details  Name: [name] MRN: [id number] Date of Birth: [birth year]   Medicare Important Message Given:  Yes    Camillo Flaming 04/17/2016, 9:25 AM

## 2016-04-17 NOTE — Discharge Summary (Signed)
Physician Discharge Summary  [name] [id number] DOB: [birth year] DOA: 04/13/2016  PCP: [name], MD  Admit date: 04/13/2016 Discharge date: 04/17/2016  Recommendations for Outpatient Follow-up:  1. Pt will need to follow up with PCP in 1-2 weeks post discharge 2. Please obtain BMP to evaluate electrolytes and kidney function 3. Please also check CBC to evaluate Hg and Hct levels  Discharge Diagnoses:  Active Problems:   CAP (community acquired pneumonia)   PNA (pneumonia)  Discharge Condition: Stable  Diet recommendation: Heart healthy diet discussed in details   Brief Narrative:  82 y.o.femalewith known Parkinson's disease, presented with main concern of several weeks duration of progressively worsening dyspnea at rest and with exertion, subjective fevers, chills, cough productive of clear sputum. This has been associated with malaise, poor oral intake. Pt reports she was on ABX recently but seems like she has never gotten much better. No reports of chest pain other than the once present with coughing spells. No abd or urinary concerns.   ED Course:Pt hemodynamically stable, VSS, blood work notable for Cr 1.3, otherwise fairly unremarkable. Imaging studies notable for PNA, pt will be admitted by [facility] for further evaluation and treatment.   Assessment & Plan:   Active Problems: CAP (community acquired pneumonia) - LLL PNA, unknown pathogen at this time  - started on Rocephin and Zithromax, transitioned to oral Doxycycline  - sputum cultures, urine legionella and strep pneumo negative to date  - pt wants to go home and I think she is ready for discharge   Acute kidney injury  - pre renal from acute problem - placed on IVF, Cr is trending down and is WNL this AM  Parkinson's disease - continue home meds  - PT eval done today, outpatient PT recommended, order placed   DVT prophylaxis: Lovenox SQ Code Status: Full  Family Communication:  Patient, husband at bedside, rounding team with myself, RN, pharmacist, case manager all present  Disposition Plan: Home   Consultants:   None  Procedures:   None  Antimicrobials:   Zithromax 9/18 --> 9/21  Rocephin 9/18 --> 9/21  Doxycycline 9/21 -->   Procedures/Studies: Dg Chest 2 View  Result Date: 04/13/2016 CLINICAL DATA:  Patient states coughing up yellow phlegm. States SOB and overall chest pain "at times". Hx of asthma, diabetes, and bilateral mastectomy. EXAM: CHEST  2 VIEW COMPARISON:  02/07/2015 FINDINGS: Right axillary node dissection. Bilateral mastectomy. Midline trachea. Mild cardiomegaly with aortic atherosclerosis. No pleural effusion or pneumothorax. Right mid and lower lung scarring is similar. Retrocardiac density on the lateral view is new or increased. Not well localized on the frontal. Most likely in the left lower lobe. IMPRESSION: Retrocardiac density on the lateral view is suspicious for infection or aspiration. Favored to be in the left lower lobe, although difficult to localize on the frontal. Followup PA and lateral chest X-ray is recommended in 3-4 weeks following trial of antibiotic therapy to ensure resolution and exclude underlying malignancy. Aortic atherosclerosis. Electronically Signed   By: Abigail Miyamoto M.D.   On: 04/13/2016 14:55    Discharge Exam: Vitals:   04/17/16 0549 04/17/16 0741  BP: (!) 180/90 (!) 130/59  Pulse: 83 92  Resp: 18   Temp: 98.2 F (36.8 C)    Vitals:   04/16/16 1958 04/16/16 2108 04/17/16 0549 04/17/16 0741  BP:  (!) 152/69 (!) 180/90 (!) 130/59  Pulse:  80 83 92  Resp:  16 18   Temp:  98.4 F (36.9 C) 98.2  F (36.8 C)   TempSrc:  Oral Oral   SpO2: 97% 95% 98%   Weight:      Height:        General: Pt is alert, follows commands appropriately, not in acute distress Cardiovascular: Regular rate and rhythm, S1/S2 +, no murmurs, no rubs, no gallops Respiratory: Clear to auscultation bilaterally, no wheezing,  no crackles, no rhonchi Abdominal: Soft, non tender, non distended, bowel sounds +, no guarding  Discharge Instructions  Discharge Instructions    Diet - low sodium heart healthy    Complete by:  As directed    Increase activity slowly    Complete by:  As directed        Medication List    STOP taking these medications   levofloxacin 500 MG tablet Commonly known as:  LEVAQUIN     TAKE these medications   acetaminophen 325 MG tablet Commonly known as:  TYLENOL Take 650 mg by mouth every 6 (six) hours as needed for moderate pain.   acyclovir 400 MG tablet Commonly known as:  ZOVIRAX TAKE 1 TABLET BY MOUTH TWICE DAILY   bisacodyl 5 MG EC tablet Commonly known as:  DULCOLAX Take 5 mg by mouth daily as needed for moderate constipation.   CALCIUM 600-D 600-400 MG-UNIT Tabs Generic drug:  Calcium Carbonate-Vitamin D3 Take 1 tablet by mouth at bedtime.   carbidopa-levodopa 25-100 MG tablet Commonly known as:  SINEMET IR 1 tablet in the morning by mouth, 1 in the afternoon by mouth, 2 in the evening by mouth. What changed:  how much to take  how to take this  when to take this  additional instructions   cetirizine 10 MG tablet Commonly known as:  ZYRTEC Take 10 mg by mouth daily at 12 noon.   cholecalciferol 1000 units tablet Commonly known as:  VITAMIN D Take 1,000 Units by mouth every evening.   doxycycline 100 MG tablet Commonly known as:  VIBRA-TABS Take 1 tablet (100 mg total) by mouth every 12 (twelve) hours.   entacapone 200 MG tablet Commonly known as:  COMTAN TAKE 1 TABLET (200 MG TOTAL) BY MOUTH 3 (THREE) TIMES DAILY. What changed:  additional instructions   Fluticasone-Salmeterol 250-50 MCG/DOSE Aepb Commonly known as:  ADVAIR Inhale 1 puff into the lungs 2 (two) times daily.   hydrALAZINE 10 MG tablet Commonly known as:  APRESOLINE Take 1 tablet (10 mg total) by mouth 2 (two) times daily as needed. Take medication if blood pressure > 165/90  and also call your doctor to notify   multivitamin with minerals tablet Take 1 tablet by mouth daily with breakfast.   MYRBETRIQ 50 MG Tb24 tablet Generic drug:  mirabegron ER Take 50 mg by mouth daily at 12 noon. Reported on 01/30/2016   ranitidine 300 MG tablet Commonly known as:  ZANTAC Take 300 mg by mouth daily as needed for heartburn.   sertraline 25 MG tablet Commonly known as:  ZOLOFT Take one tablet daily for one week, then take two tablets once daily What changed:  how much to take  how to take this  when to take this  additional instructions       Follow-up Information    [name], MD .   Specialty:  Family Medicine Contact information: [address] [place] [address] [id number]        Faye Ramsay, MD .   Specialty:  Internal Medicine Why:  call my cell phone with questions [phone] Contact  information: [address] [name] [address] [phone]            The results of significant diagnostics from this hospitalization (including imaging, microbiology, ancillary and laboratory) are listed below for reference.     Microbiology: Recent Results (from the past 240 hour(s))  Culture, blood (Routine X 2) w Reflex to ID Panel     Status: None (Preliminary result)   Collection Time: 04/13/16  2:30 PM  Result Value Ref Range Status   Specimen Description BLOOD RIGHT ANTECUBITAL  Final   Special Requests BOTTLES DRAWN AEROBIC AND ANAEROBIC 5CC EACH  Final   Culture   Final    NO GROWTH 3 DAYS Performed at [facility]    Report Status PENDING  Incomplete  Culture, blood (Routine X 2) w Reflex to ID Panel     Status: None (Preliminary result)   Collection Time: 04/13/16  3:03 PM  Result Value Ref Range Status   Specimen Description BLOOD LEFT FOREARM  Final   Special Requests BOTTLES DRAWN AEROBIC ONLY [place]  Final   Culture   Final    NO GROWTH 3 DAYS Performed at [facility]    Report Status PENDING  Incomplete     Labs: Basic Metabolic Panel:  Recent Labs Lab 04/13/16 1356 04/14/16 0516 04/15/16 0508 04/16/16 0550  NA 138 144 143 144  K 4.4 4.3 4.0 3.9  CL 101 112* 110 107  CO2 29 26 26 30   GLUCOSE 138* 92 98 87  BUN 24* 17 13 11   CREATININE 1.31* 1.10* 1.00 0.92  CALCIUM 9.5 8.4* 9.4 9.9   CBC:  Recent Labs Lab 04/13/16 1356 04/14/16 0516 04/15/16 0508 04/16/16 0550  WBC 6.9 5.0 5.4 5.2  HGB 11.2* 10.1* 10.3* 10.9*  HCT 32.8* 29.5* 30.8* 32.3*  MCV 88.9 86.5 88.5 88.7  PLT 251 207 222 255    SIGNED: Time coordinating discharge: 30 minutes  MAGICK-Maxden Naji, MD  [facility] 04/17/2016, 12:22 PM Pager [phone]  If 7PM-7AM, please contact night-coverage [email or web address] Password [email or web address]

## 2016-04-17 NOTE — Discharge Instructions (Signed)

## 2016-04-17 NOTE — Care Management Note (Signed)
Case Management Note  Patient Details  Name: [name] MRN: [id number] Date of Birth: [birth year]  Subjective/Objective:Provided patient w/outpatient PT rehab locations-they prefer [facility]-[address], [place]. No further CM needs.                    Action/Plan:d/c home.   Expected Discharge Date:   (unknown)               Expected Discharge Plan:  Home/Self Care  In-House Referral:     Discharge planning Services  CM Consult  Post Acute Care Choice:    Choice offered to:     DME Arranged:    DME Agency:     HH Arranged:    [place] Agency:     Status of Service:  Completed, signed off  If discussed at [employer] of Stay Meetings, dates discussed:    Additional Comments:  Dessa Phi, RN 04/17/2016, 11:06 AM

## 2016-04-17 NOTE — Progress Notes (Signed)
Physical Therapy Treatment Patient Details Name: [name] MRN: [id number] DOB: 1928-01-16 Today's Date: 04/17/2016    History of Present Illness Pt is an 82 y/o female with PMHx of Parkinson's Disease admitted for pneumonia.     PT Comments    Pt ambulated in hallway again today, had 2 LOB episodes but was able to self-correct using student PT's hand. Pt required rest break during walking, SpO2 was measured. On room air, pt's SpO2 was 94% during gait and at rest. Pt feels ready to be d/c home with plans to seek OP PT if necessary. Pt also participates in OP neuro PT as needed for her Parkinson's Disease.  Spouse plans to assist pt upon return home.  Follow Up Recommendations  Outpatient PT     Equipment Recommendations  None recommended by PT    Recommendations for Other Services       Precautions / Restrictions Precautions Precautions: Fall Restrictions Weight Bearing Restrictions: No    Mobility  Bed Mobility                  Transfers Overall transfer level: Needs assistance Equipment used: 1 person hand held assist Transfers: Sit to/from Stand Sit to Stand: Min guard         General transfer comment: guarding for safety; verbal cues for UE positioning and controlled descent  Ambulation/Gait Ambulation/Gait assistance: Min guard Ambulation Distance (Feet): 300 Feet Assistive device: 1 person hand held assist Gait Pattern/deviations: Step-through pattern;Decreased stride length     General Gait Details: guarding for safety due to 2 LOB episodes during gait (pt able to self-correct); pt required seated rest break due to feeling "woosy" and then resumed gait   Stairs            Wheelchair Mobility    Modified Rankin (Stroke Patients Only)       Balance           Standing balance support: During functional activity;Single extremity supported (1 person hand hold assist) Standing balance-Leahy Scale: Poor Standing balance  comment: pt had 2 LOB episodes during gait; self-corrected using student PT's hand                    Cognition Arousal/Alertness: Awake/alert Behavior During Therapy: WFL for tasks assessed/performed Overall Cognitive Status: Within Functional Limits for tasks assessed                      Exercises      General Comments        Pertinent Vitals/Pain Pain Assessment: No/denies pain    Home Living                      Prior Function            PT Goals (current goals can now be found in the care plan section) Progress towards PT goals: Progressing toward goals    Frequency    Min 3X/week      PT Plan Current plan remains appropriate    Co-evaluation             End of Session Equipment Utilized During Treatment: Gait belt Activity Tolerance: Patient tolerated treatment well Patient left: in chair;with call bell/phone within reach     Time: 0947-1005 PT Time Calculation (min) (ACUTE ONLY): 18 min  Charges:  $Gait Training: 8-22 mins  G CodesDewitt Hoes 04/17/2016, 1:15 PM  Dewitt Hoes, SPT

## 2016-04-17 NOTE — Progress Notes (Signed)
Patient's B/P was 180/90;HR 83. PCP on call was notified.

## 2016-04-18 LAB — CULTURE, BLOOD (ROUTINE X 2)
Culture: NO GROWTH
Culture: NO GROWTH

## 2016-04-28 ENCOUNTER — Ambulatory Visit
Admission: RE | Admit: 2016-04-28 | Discharge: 2016-04-28 | Disposition: A | Discharge status: Home / Self Care | Payer: Medicare Other | Source: Ambulatory Visit | Attending: Family Medicine | Admitting: Family Medicine

## 2016-04-28 ENCOUNTER — Other Ambulatory Visit: Payer: Self-pay | Admitting: Family Medicine

## 2016-04-28 DIAGNOSIS — M5442 Lumbago with sciatica, left side: Secondary | ICD-10-CM | POA: Diagnosis not present

## 2016-04-28 DIAGNOSIS — M4186 Other forms of scoliosis, lumbar region: Secondary | ICD-10-CM | POA: Diagnosis not present

## 2016-04-28 DIAGNOSIS — J181 Lobar pneumonia, unspecified organism: Secondary | ICD-10-CM | POA: Diagnosis not present

## 2016-05-02 DIAGNOSIS — M545 Low back pain: Secondary | ICD-10-CM | POA: Diagnosis not present

## 2016-05-02 DIAGNOSIS — M4696 Unspecified inflammatory spondylopathy, lumbar region: Secondary | ICD-10-CM | POA: Diagnosis not present

## 2016-05-06 DIAGNOSIS — M47816 Spondylosis without myelopathy or radiculopathy, lumbar region: Secondary | ICD-10-CM | POA: Diagnosis not present

## 2016-05-11 DIAGNOSIS — M5136 Other intervertebral disc degeneration, lumbar region: Secondary | ICD-10-CM | POA: Diagnosis not present

## 2016-05-11 DIAGNOSIS — M4186 Other forms of scoliosis, lumbar region: Secondary | ICD-10-CM | POA: Diagnosis not present

## 2016-05-12 NOTE — Progress Notes (Signed)
[name] was seen today in the movement disorders clinic for neurologic consultation at the request of Dr. Betsy Coder.    The consultation is for the evaluation of ataxia and tremor.  The patient has been a previous pt of Dr. Erling Cruz.  Dr. Erling Cruz dx the patient with parkinsonism and ET.  This is an 82 y/o female with a complex medical hx of both non Hodgkins lymphoma require chemotherapy and breast CA, s/p bilateral mastectomy.  She has a long hx of tremor.  I reviewed Dr. Bernardo Heater notes that were made available to me.  It appears that she presented with right hand resting tremor in 2009, without associated rigidity or bradykinesia.  There were no known medications causing the problem and it appears that she was initially dx with ET and later transitioned the dx to parkinsonism.  The patient reports no tremor in the L hand.  Her husband reports a little tremor in the R leg.  The patient reports that tremor is most significant with relaxing and she has no tremor if her hands are preoccupied.    She has never been on PD medication or been to PT until yesterday.  03/16/13 update:  The pt presents today with her husband.  She has not been seen since February, 2014 at which time I dx her with PD.  She was started on levodopa 25/100 three times per day.  She did not follow up sooner as she had bronchitis and pneumonia for the last 2 months and is just now recovering.  She did go to PT and liked it but would like to go back.  She is doing well on the levodopa.  Her husband rarely notice tremor any longer.  06/28/13 update:    The pt today presents for f/u with her husband who supplements the hx.  She is on carbidopa/levodopa 25/100 three times per day.  She is doing well but notices R arm tremor now that had gone away.  Rarely she will note tremor in the L hand which she had not noted previously.  She does have some cramping of the feet.  She is unsure if this is associated with wearing off.  She did have some  of this while in my office, and it was time for her to redose the carbidopa/levodopa.  She is now attending therapy, and is enjoying and finding benefit from it.  08/08/12 update:  The patient presents today with her husband, who supplements the history.  The patient has Parkinson's disease and is currently on carbidopa/levodopa 25/100, one tablet 3 times per day.  Last visit, she was describing some cramping in her feet and I was trying to figure out whether or not this was an "off" phenomenon.  After paying attention, the patient called and said that she thought it was and we added entacapone.  The cramping in the feet is better but she is now having a stabbing and shooting pain down the back of the leg and to the lateral side of the lower leg that stops at the ankle.  It has stopped her from exercising.  She sometimes has back pain.  This is fairly inconsistent.  The leg pain seems to start about 4 days after she finished rehabilitation, which did seem to help.  She is not sure if the pain is related to the rehabilitation, however.  10/03/13 update:  The patient presents today with her husband, who supplements the history.  The patient is currently on carbidopa/levodopa 25/100, one  tablet 3 times per day.  She is also on Comtan 200 mg, one tablet 3 times per day.  From a Parkinson's standpoint, she has been doing well.  No falls.  No hallucinations.  Her husband rarely sees tremor, and that is primarily when she is in bed at night and he will feel it rather than see it.  Last visit, she was complaining of back pain that was likely sciatica.  I ordered an EMG but they ultimately canceled that.  I tried her on Lyrica but that caused her to be excessively sleepy.  She went to the chiropractor and states that she is doing much better from that regard.  Unfortunately, she has had bronchitis and a urinary tract infection.  She has been on several antibiotics, which have caused her to be nauseated.  She has also had  dental work and had several of her teeth pulled.  Because of these things, she has had a decreased appetite and really has not been able to eat.  She had a temporary partial placed, but her appetite still hasn't been what it used to be, primarily because of the nausea from the antibiotic.  She has not been exercising because of being sick.  She is hoping to get back to that soon.  02/02/14 update:  The patient presents today with her husband, who supplements the history.  The patient is currently on carbidopa/levodopa 25/100, 1 tablet at 9 AM/1 PM and 2 tablets at 6 PM.   She is also on Comtan 200 mg, one tablet 3 times per day.   The discomfort in the foot is much better.  From a Parkinson's standpoint, she has been doing well.  No falls.  No hallucinations.  The patient was screened through our Parkinson's screening program on 01/16/2014 and was either at baseline or better than baseline at all therapies and is scheduled for re-screening in January.  She is not currently exercising.    06/05/14 update:  The patient presents today with her husband, who supplements the history.  The patient is currently on carbidopa/levodopa 25/100, 1 tablet at 9 AM/1 PM and 2 tablets at 6 PM.   She is also on Comtan 200 mg, one tablet 3 times per day.  No falls.  No lightheadedness or near syncope.  No hallucinations.   I reviewed records since last visit.  Weight loss has been a concern.  The dietician has talked with her about suggestions to help, and the patient states that she was told to take Carnation 3 times per day.  Pt states that the biggest issue that has caused weight loss has been persistent nausea, which has been going on for about 2 months.  They really do not think that it is related to the Parkinson's medications, because it did not start with changes in Parkinson's medications.  It actually seemed to start after a visit to a restaurant and then persisted.  She just feels nauseated, has lost taste for food and  has lost appetite.  She followed up with Dr. Kenton Kingfisher and was started on Zantac about 3 or 4 days ago and does feel better and her blood pressure medication was discontinued.  10/04/14 update:  The patient presents today with her husband, who supplements the history.  The patient is currently on carbidopa/levodopa 25/100, 1 tablet at 9 AM/1 PM and 2 tablets at 6 PM.   She is also on Comtan 200 mg, one tablet 3 times per day. We d/c her comtan  for a week to see if her GI upset changed at all and it did not so we restarted it since obviously that was not the etiology.  She continues to have GI upset in the AM.  She went to her PCP and she was given Zofran and states that it made it worse but she only took it once.  It is only the AM when she has nausea and she does better the rest of the day.    12/14/14 update:  The patient returns today, accompanied by her husband who supplements the history.  Records were reviewed since last visit.  She has a history of breast cancer and was having weight loss/anorexia and saw her oncologist.  She ended up having a CT chest, abdomen and pelvis and those were unremarkable for any new lesions.  She has lost about 13 pounds since March of last year.  She has been complaining about nausea the last several visits.  In fact, we've previously stopped her entacapone to see if that would help the nausea, but it did not.  She did not think that it was related at all to her levodopa.  She no longer has the nausea but in the AM she doesn't feel good.  She has difficulty describing what this means.  She remains on carbidopa/levodopa 25/100, 1 tablet at 9 AM/one tablet at 1 PM and 2 tablets at 6 PM.  She is still on entacapone, 200 mg 3 times a day.  They brought with them a list of blood pressures and while the SBP is generally high (170's-190's without med and 150's-180's with BP med) it was never over 200.  She states that she was off the BP med a few days and felt better but when she went  back on it she felt worse again.  She was told to go back on it because it her BP's were too high.  She is only on amlodipine 2.5 mg.    03/05/15 update:  The patient is following up today regarding her Parkinson's disease.  She is currently on carbidopa/levodopa 25/100, 1 tablet at 9 AM, 1 tablet at 1 PM and 2 tablets at 6 PM.  She takes entacapone with each of these dosages.  At last visit, she was very focused on her blood pressure and I asked her to follow-up with her primary care physician to see if she actually needed her blood pressure medication.  Today, the patient and her husband state that she was able to d/c her amlodipine and her BP has been well controlled. She states that the nausea is better.  She states that she gets up in the AM and takes her med and just feels jittery and anxious.  Her husband asks about trying to take a levodopa at bedtime to see if that helps.  When she takes her xanax, the anxiety does seem to get better.  They were thinking about asking PCP to see if she can go up on the medication.  Husband admits that balance sometimes off and cognition not great.  PT did not think that she needed walker per their hx.  She has gotten back to her exercise.   By 3-4 pm, pt feels well.    06/11/15 update:  The patient presents today for follow-up, accompanied by her husband who supplements the history.  She is on carbidopa/levodopa 25/100, 1 tablet at 9 AM, 1 tablet at 1 PM and 2 tablets at 6 PM.  She is on entacapone  200 mg 3 times a day and last visit we added carbidopa/levodopa 50/200 at night.  She didn't notice a big difference with that. Her husband isn't sure that she really gave it enough time to know.   Her biggest issue last visit was her anxiety and I told her to try an extra levodopa when she is anxious to see if that helps.  They tried that and it didn't help.    I did tell her that I did not want her taking daily Xanax.  Today, the patient states that the "only thing that helps  is xanax."   Unfortunately, she has not been exercising.  Her husband is undergoing tx daily for prostate CA and they are consumed with that.  Balance has been okay and her husband states that she is doing well in that regard.  No falls.  She is sleeping from 9pm until 5-6am. She is taking naps during the day.   09/11/15 update:  The patient follows up today, accompanied by her husband who supplements the history.  She is on carbidopa/levodopa 25/100, 1 tablet at 9 AM, 1 at 1 PM and 2 tablets at 6 PM.  I asked her to retry carbidopa/levodopa 50/200 at night last visit.  She is still on the entacapone 200 mg 3 times per day.  Last visit, I encouraged her to discontinue her daily Xanax and we started her on Wellbutrin XL for her anxiety.  Her husband thinks that she is still on it (and off of xanax) but wellbutrin is not on her medication list today.  Anxiety has been better per both patient and husband.  Husband states that Dr. Kenton Kingfisher told them to ask me about sertraline.  She was going to come in and talk to me on January 11, but ended up having increased hallucinations and was admitted to the hospital from January 11 to 08/11/2015.  She was treated for urinary tract infection although her urine culture ended up being negative.  Husband reports that she is 90-95% better from a mental standpoint.  No hallucinations since d/c from the hospital.  Doing home PT/OT/ST and nurse comes once per week.    01/09/16 update:  The patient follows up today, accompanied by her husband who supplements the history.  She is on carbidopa/levodopa 25/100, 1 tablet at 9 AM, 1 at 1 PM and 2 tablets at 6 PM.  For some reason, her carbidopa/levodopa 50/200 got accidentally stopped, and I told them to just go ahead and hold that for now.  She is still on the entacapone 200 mg 3 times per day.  Last visit, her husband was not sure if she was still on the Wellbutrin and ended up calling me back.  This was stopped during her hospitalization.   She, therefore, started on Zoloft instead right after our last visit.  She took a single 25 mg tablet and felt nauseated, but I reminded her that she feels nauseated a lot in the morning and asked her to retry it, telling her that she could take it before bedtime and then work up to 50 mg at night.  She is only on 25 mg and husband states that she is doing well and anxiety is better.  Only time an issue is that she hates to get in a car and travel.    Nausea is also better.    05/13/16 update:  The patient follows up today, accompanied by her husband who supplements the history.  She is on  carbidopa/levodopa 25/100, 1 tablet at 9 AM, 1 at 1 PM and 2 tablets at 6 PM.  She is on entacapone 200 mg 3 times a day with each of these dosages.  For the anxiety and depression, she is on Zoloft which has been increased to 50 mg daily.  That has helped.  She was hospitalized from 9/18-9/22 for CAP.  The records that were made available to me were reviewed. Having a facet joint injection tomorrow by Dr. Jacelyn Grip for back pain.  Not been able to get back to exercise because of pneumonia and back pain.  Has PD screen on 11/28.  She was referred last visit to cardiology for her heart murmur.  She saw the cardiologist on 09/02/2015 and has an echocardiogram pending.    Neuroimaging has  previously been performed.  An MRI brain was last done in 2010 but pt can no longer have MRI's because of an implantable hearing device.    PREVIOUS MEDICATIONS: none to date  ALLERGIES:  No Known Allergies  CURRENT MEDICATIONS:  Current Outpatient Prescriptions on File Prior to Visit  Medication Sig Dispense Refill  . acetaminophen (TYLENOL) 325 MG tablet Take 650 mg by mouth every 6 (six) hours as needed for moderate pain.     [name] acyclovir (ZOVIRAX) 400 MG tablet TAKE 1 TABLET BY MOUTH TWICE DAILY 60 tablet 11  . bisacodyl (DULCOLAX) 5 MG EC tablet Take 5 mg by mouth daily as needed for moderate constipation.    . Calcium  Carbonate-Vitamin D3 (CALCIUM 600-D) 600-400 MG-UNIT TABS Take 1 tablet by mouth at bedtime.    . carbidopa-levodopa (SINEMET IR) 25-100 MG tablet 1 tablet in the morning by mouth, 1 in the afternoon by mouth, 2 in the evening by mouth. (Patient taking differently: Take 1-2 tablets by mouth 3 (three) times daily. Takes 1 tablet in the morning and afternoon, then 2 tablets in the evening) 360 tablet 1  . cetirizine (ZYRTEC) 10 MG tablet Take 10 mg by mouth daily at 12 noon.     . cholecalciferol (VITAMIN D) 1000 units tablet Take 1,000 Units by mouth every evening.     . entacapone (COMTAN) 200 MG tablet TAKE 1 TABLET (200 MG TOTAL) BY MOUTH 3 (THREE) TIMES DAILY. (Patient taking differently: TAKE 1 TABLET (200 MG TOTAL) BY MOUTH 3 (THREE) TIMES DAILY - take with Sinemet) 270 tablet 3  . Fluticasone-Salmeterol (ADVAIR) 250-50 MCG/DOSE AEPB Inhale 1 puff into the lungs 2 (two) times daily.    . mirabegron ER (MYRBETRIQ) 50 MG TB24 tablet Take 50 mg by mouth daily at 12 noon. Reported on 01/30/2016    . Multiple Vitamins-Minerals (MULTIVITAMIN WITH MINERALS) tablet Take 1 tablet by mouth daily with breakfast.     . ranitidine (ZANTAC) 300 MG tablet Take 300 mg by mouth daily as needed for heartburn.     . sertraline (ZOLOFT) 25 MG tablet Take one tablet daily for one week, then take two tablets once daily (Patient taking differently: Take 25 mg by mouth at bedtime. ) 60 tablet 5   No current facility-administered medications on file prior to visit.     PAST MEDICAL HISTORY:   Past Medical History:  Diagnosis Date  . Anxiety   . Asthma   . Depression   . Diabetes mellitus without complication ([place])    [id number].Marland KitchenMarland Kitchenpt denies  . GERD (gastroesophageal reflux disease)   . Hearing loss   . NHL (non-Hodgkin's lymphoma) ([place])    nhl dx 9/04 breast ca  dx1/12  . Parkinson disease ([place])     PAST SURGICAL HISTORY:   Past Surgical History:  Procedure Laterality Date  . Ba-HA Ear implant    . MASTECTOMY   2 /8/ 12   bilateral  . MASTECTOMY      SOCIAL HISTORY:   Social History   Social History  . Marital status: Married    Spouse name: N/A  . Number of children: N/A  . Years of education: N/A   Occupational History  . retired Retired    Pharmacist, hospital   Social History Main Topics  . Smoking status: Never Smoker  . Smokeless tobacco: Never Used     Comment: husband wsa smoker  . Alcohol use No     Comment: no  . Drug use: No  . Sexual activity: Not on file   Other Topics Concern  . Not on file   Social History Narrative  . No narrative on file    FAMILY HISTORY:   Family Status  Relation Status  . Father Deceased   CA, lung  . Mother Deceased   CA; complications of splenectomy  . Brother Deceased   3, renal failure, DM-2; CAD; CA  . Child Alive   5 (4 biological), one with PKU    ROS:  A complete 10 system review of systems was obtained and was unremarkable apart from what is mentioned above.  PHYSICAL EXAMINATION:    VITALS:   Vitals:   05/13/16 1101  BP: (!) 142/80  Pulse: 75  Weight: 128 lb (58.1 kg)  Height: 5\' 3"  (1.6 m)   Wt Readings from Last 3 Encounters:  05/13/16 128 lb (58.1 kg)  04/13/16 137 lb 9.1 oz (62.4 kg)  04/07/16 128 lb 11.2 oz (58.4 kg)     GEN:  The patient appears stated age and is in NAD. CV:  RRR with 3/6 SEM Lungs:  CTAB Neck:  No bruits but cardiac murmur radiates to the bilateral carotids   Neurological examination:  Orientation: She is alert and oriented to person and place.   Montreal Cognitive Assessment  05/13/2016 09/11/2015 12/14/2014  Visuospatial/ Executive (0/5) 3 3 3   Naming (0/3) 2 3 3   Attention: Read list of digits (0/2) 2 1 2   Attention: Read list of letters (0/1) 1 1 1   Attention: Serial 7 subtraction starting at 100 (0/3) 0 0 0  Language: Repeat phrase (0/2) 1 0 1  Language : Fluency (0/1) 1 1 1   Abstraction (0/2) 1 1 1   Delayed Recall (0/5) 0 0 0  Orientation (0/6) 5 6 6   Total 16 16 18    Adjusted Score (based on education) 17 17 19    Cranial nerves: There is good facial symmetry.  The visual fields are full to confrontational testing. The speech is fluent and clear.  She is hypophonic.  Soft palate rises symmetrically and there is no tongue deviation. Hearing is intact to conversational tone. Sensation: Sensation is intact to light outh throughout. Motor: Strength is 5/5 in the bilateral upper and lower extremities.   Shoulder shrug is equal and symmetric.  There is no pronator drift.   Movement examination: Tone: There is no rigidity. Abnormal movements: There is rare tremor in both UE that is low amplitude Coordination:  There is no decremation noted today Gait and Station: The patient no has trouble arising out of the chair without use of her hands today.  Stride length and arm swing are good.  She has pisa syndrome Labs:  Lab Results  Component Value Date   WBC 5.2 04/16/2016   HGB 10.9 (L) 04/16/2016   HCT 32.3 (L) 04/16/2016   MCV 88.7 04/16/2016   PLT 255 04/16/2016     Chemistry      Component Value Date/Time   NA 144 04/16/2016 0550   NA 143 04/07/2016 1054   K 3.9 04/16/2016 0550   K 4.5 04/07/2016 1054   CL 107 04/16/2016 0550   CL 106 12/26/2012 1110   CO2 30 04/16/2016 0550   CO2 28 04/07/2016 1054   BUN 11 04/16/2016 0550   BUN 15.8 04/07/2016 1054   CREATININE 0.92 04/16/2016 0550   CREATININE 1.2 (H) 04/07/2016 1054      Component Value Date/Time   CALCIUM 9.9 04/16/2016 0550   CALCIUM 10.0 04/07/2016 1054   ALKPHOS 170 (H) 04/07/2016 1054   AST 27 04/07/2016 1054   ALT 13 04/07/2016 1054   BILITOT 0.55 04/07/2016 1054      Lab Results  Component Value Date   VITAMINB12 1053 (H) 06/10/2006   Lab Results  Component Value Date   TSH 2.93 09/19/2012    ASSESSMENT/PLAN:  1.  Idiopathic Parkinson's disease, dx 08/2012.  This is evidenced by bradykinesia, tremor, postural instability and rigidity.  There are no atypical  features.  -We discussed the diagnosis as well as pathophysiology of the disease.  We discussed treatment options as well as prognostic indicators.  Patient education was provided.  - she is currently on carbidopa/levodopa 25/100 one tablet at 9 AM/one tablet at 1 PM and 2 tablets at 6 PM and will continue this.  She needs to avoid protein when taking levodopa and discussed this.  Risks, benefits, side effects and alternative therapies were discussed.  The opportunity to ask questions was given and they were answered to the best of my ability.  The patient expressed understanding and willingness to follow the outlined treatment protocols.  -Pt is on entacapone tid and she will continue on this.    -was supposed to restart carbidopa/levodopa 50/200 q hs previously but didn't and decided to hold on that for now.  -she has a PT screen upcoming.    -talked about reading out loud, downloading voice apps to let her know how many decibals she is generating, etc 2.  Weight loss/AM nausea  -losing weight again, but attributes to being back in hospital.  Use protein supplements 3.  Anxiety  -doing well on zoloft 50 mg at night. They are going to ask prescribing physician.    4.  Cardiac murmur  -being followed yearly by cardiology for mod AS 5.  PDD  -dont want to add further meds right now.  Is free of hallucinations but if occur again, will consider nuplazid.  6. Follow up is anticipated in the next few months, sooner should new neurologic issues arise.  Much greater than 50% of this visit was spent in counseling and coordinating care.  Total face to face time:  25 min

## 2016-05-13 ENCOUNTER — Ambulatory Visit (INDEPENDENT_AMBULATORY_CARE_PROVIDER_SITE_OTHER): Payer: Medicare Other | Admitting: Neurology

## 2016-05-13 ENCOUNTER — Encounter: Payer: Self-pay | Admitting: Neurology

## 2016-05-13 VITALS — BP 142/80 | HR 75 | Ht 63.0 in | Wt 128.0 lb

## 2016-05-13 DIAGNOSIS — G20 Parkinson's disease: Secondary | ICD-10-CM

## 2016-05-13 DIAGNOSIS — G20A1 Parkinson's disease without dyskinesia, without mention of fluctuations: Secondary | ICD-10-CM

## 2016-05-13 DIAGNOSIS — F0280 Dementia in other diseases classified elsewhere without behavioral disturbance: Secondary | ICD-10-CM | POA: Diagnosis not present

## 2016-05-14 DIAGNOSIS — M47816 Spondylosis without myelopathy or radiculopathy, lumbar region: Secondary | ICD-10-CM | POA: Diagnosis not present

## 2016-05-28 DIAGNOSIS — M4186 Other forms of scoliosis, lumbar region: Secondary | ICD-10-CM | POA: Diagnosis not present

## 2016-05-28 DIAGNOSIS — M5136 Other intervertebral disc degeneration, lumbar region: Secondary | ICD-10-CM | POA: Diagnosis not present

## 2016-06-01 DIAGNOSIS — M5136 Other intervertebral disc degeneration, lumbar region: Secondary | ICD-10-CM | POA: Diagnosis not present

## 2016-06-01 DIAGNOSIS — M4186 Other forms of scoliosis, lumbar region: Secondary | ICD-10-CM | POA: Diagnosis not present

## 2016-06-01 DIAGNOSIS — M47816 Spondylosis without myelopathy or radiculopathy, lumbar region: Secondary | ICD-10-CM | POA: Diagnosis not present

## 2016-06-03 DIAGNOSIS — M4186 Other forms of scoliosis, lumbar region: Secondary | ICD-10-CM | POA: Diagnosis not present

## 2016-06-03 DIAGNOSIS — M5136 Other intervertebral disc degeneration, lumbar region: Secondary | ICD-10-CM | POA: Diagnosis not present

## 2016-06-09 DIAGNOSIS — Z23 Encounter for immunization: Secondary | ICD-10-CM | POA: Diagnosis not present

## 2016-06-11 DIAGNOSIS — M47816 Spondylosis without myelopathy or radiculopathy, lumbar region: Secondary | ICD-10-CM | POA: Diagnosis not present

## 2016-06-23 ENCOUNTER — Ambulatory Visit: Payer: Medicare Other | Admitting: Occupational Therapy

## 2016-06-23 ENCOUNTER — Ambulatory Visit: Payer: Medicare Other | Admitting: Physical Therapy

## 2016-06-23 ENCOUNTER — Ambulatory Visit: Payer: Medicare Other

## 2016-06-25 DIAGNOSIS — M47816 Spondylosis without myelopathy or radiculopathy, lumbar region: Secondary | ICD-10-CM | POA: Diagnosis not present

## 2016-07-06 ENCOUNTER — Other Ambulatory Visit: Payer: Self-pay | Admitting: Neurology

## 2016-07-06 MED ORDER — CARBIDOPA-LEVODOPA 25-100 MG PO TABS: ORAL_TABLET | ORAL | 1 refills | Status: DC

## 2016-07-08 DIAGNOSIS — F419 Anxiety disorder, unspecified: Secondary | ICD-10-CM | POA: Diagnosis not present

## 2016-07-08 DIAGNOSIS — H01004 Unspecified blepharitis left upper eyelid: Secondary | ICD-10-CM | POA: Diagnosis not present

## 2016-07-08 DIAGNOSIS — H01001 Unspecified blepharitis right upper eyelid: Secondary | ICD-10-CM | POA: Diagnosis not present

## 2016-07-08 DIAGNOSIS — H01005 Unspecified blepharitis left lower eyelid: Secondary | ICD-10-CM | POA: Diagnosis not present

## 2016-07-08 DIAGNOSIS — H01002 Unspecified blepharitis right lower eyelid: Secondary | ICD-10-CM | POA: Diagnosis not present

## 2016-07-08 DIAGNOSIS — G20 Parkinson's disease: Secondary | ICD-10-CM | POA: Diagnosis not present

## 2016-07-08 DIAGNOSIS — Z853 Personal history of malignant neoplasm of breast: Secondary | ICD-10-CM | POA: Diagnosis not present

## 2016-07-08 DIAGNOSIS — I10 Essential (primary) hypertension: Secondary | ICD-10-CM | POA: Diagnosis not present

## 2016-07-08 DIAGNOSIS — Z8572 Personal history of non-Hodgkin lymphomas: Secondary | ICD-10-CM | POA: Diagnosis not present

## 2016-07-15 ENCOUNTER — Encounter: Payer: Self-pay | Admitting: Physical Therapy

## 2016-07-15 NOTE — Therapy (Signed)
[facility] [address] [place], [name], [address] Phone: [phone]   Fax:  [phone]  Patient Details  Name: [name] MRN: [id number] Date of Birth: 1928/03/16 Referring Provider:  Suella Broad, MD [name], PCP)  Encounter Date: 07/15/2016  PHYSICAL THERAPY DISCHARGE SUMMARY  Visits from Start of Care: 5  Current functional level related to goals / functional outcomes: Patient called to report she got a shot and felt better. So she cancelled all remaining PT appointments.    Remaining deficits: Unknown as PT did not see pt for discharge.    Education / Equipment: HEP  Plan: Patient agrees to discharge.  Patient goals were partially met. Patient is being discharged due to being pleased with the current functional level.  ?????         Angelik Walls PT, DPT 07/15/2016, 3:13 PM  [facility] [address] [place], [name], [address] Phone: [phone]   Fax:  [phone]

## 2016-07-18 ENCOUNTER — Other Ambulatory Visit: Payer: Self-pay | Admitting: Oncology

## 2016-07-30 DIAGNOSIS — H908 Mixed conductive and sensorineural hearing loss, unspecified: Secondary | ICD-10-CM | POA: Diagnosis not present

## 2016-07-30 DIAGNOSIS — J069 Acute upper respiratory infection, unspecified: Secondary | ICD-10-CM | POA: Diagnosis not present

## 2016-07-30 DIAGNOSIS — H95191 Other disorders following mastoidectomy, right ear: Secondary | ICD-10-CM | POA: Diagnosis not present

## 2016-08-03 DIAGNOSIS — J069 Acute upper respiratory infection, unspecified: Secondary | ICD-10-CM | POA: Diagnosis not present

## 2016-08-03 DIAGNOSIS — H95191 Other disorders following mastoidectomy, right ear: Secondary | ICD-10-CM | POA: Diagnosis not present

## 2016-08-03 DIAGNOSIS — H908 Mixed conductive and sensorineural hearing loss, unspecified: Secondary | ICD-10-CM | POA: Diagnosis not present

## 2016-08-06 ENCOUNTER — Ambulatory Visit (HOSPITAL_BASED_OUTPATIENT_CLINIC_OR_DEPARTMENT_OTHER): Payer: Medicare Other | Admitting: Oncology

## 2016-08-06 ENCOUNTER — Other Ambulatory Visit (HOSPITAL_BASED_OUTPATIENT_CLINIC_OR_DEPARTMENT_OTHER): Payer: Medicare Other

## 2016-08-06 VITALS — BP 139/72 | HR 81 | Temp 98.4°F | Resp 17 | Wt 133.7 lb

## 2016-08-06 DIAGNOSIS — M79604 Pain in right leg: Secondary | ICD-10-CM | POA: Diagnosis not present

## 2016-08-06 DIAGNOSIS — C50912 Malignant neoplasm of unspecified site of left female breast: Secondary | ICD-10-CM

## 2016-08-06 DIAGNOSIS — C8590 Non-Hodgkin lymphoma, unspecified, unspecified site: Secondary | ICD-10-CM | POA: Diagnosis not present

## 2016-08-06 DIAGNOSIS — D649 Anemia, unspecified: Secondary | ICD-10-CM | POA: Diagnosis not present

## 2016-08-06 DIAGNOSIS — Z170 Estrogen receptor positive status [ER+]: Secondary | ICD-10-CM

## 2016-08-06 DIAGNOSIS — C50911 Malignant neoplasm of unspecified site of right female breast: Secondary | ICD-10-CM

## 2016-08-06 LAB — CBC WITH DIFFERENTIAL/PLATELET
BASO%: 0.4 % (ref 0.0–2.0)
BASOS ABS: 0 10*3/uL (ref 0.0–0.1)
EOS%: 1.1 % (ref 0.0–7.0)
Eosinophils Absolute: 0.1 10*3/uL (ref 0.0–0.5)
HCT: 32.7 % — ABNORMAL LOW (ref 34.8–46.6)
HEMOGLOBIN: 10.8 g/dL — AB (ref 11.6–15.9)
LYMPH%: 10.3 % — AB (ref 14.0–49.7)
MCH: 29.2 pg (ref 25.1–34.0)
MCHC: 33 g/dL (ref 31.5–36.0)
MCV: 88.4 fL (ref 79.5–101.0)
MONO#: 0.4 10*3/uL (ref 0.1–0.9)
MONO%: 6 % (ref 0.0–14.0)
NEUT#: 5.8 10*3/uL (ref 1.5–6.5)
NEUT%: 82.2 % — AB (ref 38.4–76.8)
Platelets: 215 10*3/uL (ref 145–400)
RBC: 3.7 10*6/uL (ref 3.70–5.45)
RDW: 12.4 % (ref 11.2–14.5)
WBC: 7 10*3/uL (ref 3.9–10.3)
lymph#: 0.7 10*3/uL — ABNORMAL LOW (ref 0.9–3.3)
nRBC: 0 % (ref 0–0)

## 2016-08-06 LAB — COMPREHENSIVE METABOLIC PANEL
ALBUMIN: 3.4 g/dL — AB (ref 3.5–5.0)
ALK PHOS: 127 U/L (ref 40–150)
ALT: 9 U/L (ref 0–55)
AST: 19 U/L (ref 5–34)
Anion Gap: 9 mEq/L (ref 3–11)
BUN: 15.7 mg/dL (ref 7.0–26.0)
CALCIUM: 9.9 mg/dL (ref 8.4–10.4)
CHLORIDE: 105 meq/L (ref 98–109)
CO2: 29 mEq/L (ref 22–29)
Creatinine: 1.1 mg/dL (ref 0.6–1.1)
EGFR: 45 mL/min/{1.73_m2} — AB (ref >90)
GLUCOSE: 97 mg/dL (ref 70–140)
POTASSIUM: 4.5 meq/L (ref 3.5–5.1)
SODIUM: 143 meq/L (ref 136–145)
Total Bilirubin: 0.29 mg/dL (ref 0.20–1.20)
Total Protein: 6.3 g/dL — ABNORMAL LOW (ref 6.4–8.3)

## 2016-08-06 NOTE — Progress Notes (Signed)
[place] OFFICE PROGRESS NOTE   Diagnosis: Non-Hodgkin's,, ITP, breast cancer  INTERVAL HISTORY:   Mrs. Caridi returns as scheduled. She and her husband both had an upper respiratory infection over the past few weeks. The symptoms have improved. No change over the chest wall. She has lower back pain, improved after injection therapy by orthopedics. She complains of swelling in the right lower leg/ankle for the past 2 weeks. There is mild pain at the right upper posterior calf and popliteal fossa.   Objective:  Vital signs in last 24 hours:  Blood pressure 139/72, pulse 81, temperature 98.4 F (36.9 C), temperature source Oral, resp. rate 17, weight 133 lb 11.2 oz (60.6 kg), SpO2 96 %.    HEENT: Neck without mass Lymphatics: No cervical, supraclavicular, axillary, or inguinal nodes Resp: Coarse rhonchi at the posterior bases bilaterally, no respiratory distress Cardio: Regular rate and rhythm GI: No hepatosplenomegaly, no mass Vascular: Trace edema at the right ankle and right lower leg. No erythema or palpable cord. Breasts: Status post bilateral mastectomy. No evidence for chest wall tumor recurrence.    Lab Results:  Lab Results  Component Value Date   WBC 7.0 08/06/2016   HGB 10.8 (L) 08/06/2016   HCT 32.7 (L) 08/06/2016   MCV 88.4 08/06/2016   PLT 215 08/06/2016   NEUTROABS 5.8 08/06/2016     Medications: I have reviewed the patient's current medications.  Assessment/Plan: 1. Non-Hodgkin lymphoma treated with fludarabine/rituximab, last in July of 2009.  1. Restaging CT 11/24/2010 revealed evidence of progressive lymphoma in the abdomen and pelvis.  2. Initiation of salvage therapy with bendamustine/rituximab 12/04/2010. She completed 3 cycles.  3. Restaging CT 03/03/2011 revealed stable retroperitoneal soft tissue and a marked decrease in the soft tissue thickening associated with dilated loops of small bowel. 2. History of ITP.  Intermittent mild thrombocytopenia persists. 3. History of Herpes zoster-maintained on prophylactic acyclovir. She will decrease the acyclovir to once daily 4. Anemia secondary to non-Hodgkin lymphoma, chemotherapy, and a history of autoimmune hemolysis.  5. Hypertension. 6. Right-hand tremor/ataxia-followed by neurology, now taking Sinemet, diagnosed with Parkinson's disease 7. Hearing loss-status post placement of an implanted hearing device by Dr. Cresenciano Lick. 8. Bilateral breast cancer, right breast cancer-grade 1, T1 N0, ER/PR positive, and HER2 negative. Left breast cancer, synchronous grade 2, T2 N0, ER/PR positive, and HER2 negative. She began adjuvant Arimidex on 09/15/2010,completed February 2017 9. Inflammatory changes of both lungs on a CT of the chest 11/24/2010-progressive on the restaging CT 03/03/2011, status post a bronchoscopy 03/17/2011 with no evidence of granulomata or tumor. 10. 11 mm spiculated lesion in the lingula on a CT 11/24/2010-less distinct on the CT 03/03/2011. 11. Upper respiratory infection while visiting her son in [place], June 2013, resolved after treatment with Levaquin/steroids 12. Upper respiratory infection,? Pneumonia summer 2014-chest x-ray 02/17/2013 with bilateral infiltrates 13. CT chest abdomen and pelvis on 11/29/2014 with no evidence of active lymphoma or metastatic breast cancer. Previously demonstrated retroperitoneal process appeared improved with probable residual fibrosis. No discrete adenopathy. Interval near complete resolution of patchy airspace opacities in both lungs. 14. Mild swelling and pain at the right lower leg 08/06/2016-referred for a Doppler     Disposition:  Mrs. Mendel appears stable from an oncology standpoint. No evidence for progression of the breast cancer or lymphoma. She has chronic mild anemia. We will refer her for a Doppler of the right leg to rule out a deep vein thrombosis. She will return for an office visit  in 4 months.  Betsy Coder, MD  08/06/2016  12:26 PM

## 2016-08-07 ENCOUNTER — Telehealth: Payer: Self-pay | Admitting: *Deleted

## 2016-08-07 ENCOUNTER — Ambulatory Visit (HOSPITAL_COMMUNITY)
Admission: RE | Admit: 2016-08-07 | Discharge: 2016-08-07 | Disposition: A | Discharge status: Home / Self Care | Payer: Medicare Other | Source: Ambulatory Visit | Attending: Oncology | Admitting: Oncology

## 2016-08-07 DIAGNOSIS — C50912 Malignant neoplasm of unspecified site of left female breast: Secondary | ICD-10-CM | POA: Insufficient documentation

## 2016-08-07 DIAGNOSIS — R938 Abnormal findings on diagnostic imaging of other specified body structures: Secondary | ICD-10-CM | POA: Insufficient documentation

## 2016-08-07 DIAGNOSIS — M47816 Spondylosis without myelopathy or radiculopathy, lumbar region: Secondary | ICD-10-CM | POA: Diagnosis not present

## 2016-08-07 DIAGNOSIS — Z170 Estrogen receptor positive status [ER+]: Secondary | ICD-10-CM | POA: Diagnosis not present

## 2016-08-07 DIAGNOSIS — C50911 Malignant neoplasm of unspecified site of right female breast: Secondary | ICD-10-CM | POA: Insufficient documentation

## 2016-08-07 NOTE — Progress Notes (Signed)
*  PRELIMINARY RESULTS* Vascular Ultrasound Right lower extremity venous duplex has been completed.  Preliminary findings: no evidence of acute DVT. No baker's cyst. Superficial thrombosis noted in the right small saphenous vein.   Called results to [facility]. Will send patient to cancer center to talk with nurse.     Landry Mellow, RDMS, RVT  08/07/2016, 10:25 AM

## 2016-08-07 NOTE — Telephone Encounter (Signed)
Called pt's husband with instructions per Dr. Benay Spice. He voiced understanding.

## 2016-08-07 NOTE — Telephone Encounter (Addendum)
Received call report from [place] in [place] lab. Dr. Benay Spice notified of superficial thrombus in small saphenous vein of RLE.  Aspirin 325 mg daily for 10 days. Warm compresses QID. Call office if no improvement after 10 days, per MD.

## 2016-08-18 NOTE — Progress Notes (Signed)
[name] was seen today in the movement disorders clinic for neurologic consultation at the request of Dr. Betsy Coder.    The consultation is for the evaluation of ataxia and tremor.  The patient has been a previous pt of Dr. Erling Cruz.  Dr. Erling Cruz dx the patient with parkinsonism and ET.  This is an 83 y/o female with a complex medical hx of both non Hodgkins lymphoma require chemotherapy and breast CA, s/p bilateral mastectomy.  She has a long hx of tremor.  I reviewed Dr. Bernardo Heater notes that were made available to me.  It appears that she presented with right hand resting tremor in 2009, without associated rigidity or bradykinesia.  There were no known medications causing the problem and it appears that she was initially dx with ET and later transitioned the dx to parkinsonism.  The patient reports no tremor in the L hand.  Her husband reports a little tremor in the R leg.  The patient reports that tremor is most significant with relaxing and she has no tremor if her hands are preoccupied.    She has never been on PD medication or been to PT until yesterday.  03/16/13 update:  The pt presents today with her husband.  She has not been seen since February, 2014 at which time I dx her with PD.  She was started on levodopa 25/100 three times per day.  She did not follow up sooner as she had bronchitis and pneumonia for the last 2 months and is just now recovering.  She did go to PT and liked it but would like to go back.  She is doing well on the levodopa.  Her husband rarely notice tremor any longer.  06/28/13 update:    The pt today presents for f/u with her husband who supplements the hx.  She is on carbidopa/levodopa 25/100 three times per day.  She is doing well but notices R arm tremor now that had gone away.  Rarely she will note tremor in the L hand which she had not noted previously.  She does have some cramping of the feet.  She is unsure if this is associated with wearing off.  She did have some  of this while in my office, and it was time for her to redose the carbidopa/levodopa.  She is now attending therapy, and is enjoying and finding benefit from it.  08/08/12 update:  The patient presents today with her husband, who supplements the history.  The patient has Parkinson's disease and is currently on carbidopa/levodopa 25/100, one tablet 3 times per day.  Last visit, she was describing some cramping in her feet and I was trying to figure out whether or not this was an "off" phenomenon.  After paying attention, the patient called and said that she thought it was and we added entacapone.  The cramping in the feet is better but she is now having a stabbing and shooting pain down the back of the leg and to the lateral side of the lower leg that stops at the ankle.  It has stopped her from exercising.  She sometimes has back pain.  This is fairly inconsistent.  The leg pain seems to start about 4 days after she finished rehabilitation, which did seem to help.  She is not sure if the pain is related to the rehabilitation, however.  10/03/13 update:  The patient presents today with her husband, who supplements the history.  The patient is currently on carbidopa/levodopa 25/100, one  tablet 3 times per day.  She is also on Comtan 200 mg, one tablet 3 times per day.  From a Parkinson's standpoint, she has been doing well.  No falls.  No hallucinations.  Her husband rarely sees tremor, and that is primarily when she is in bed at night and he will feel it rather than see it.  Last visit, she was complaining of back pain that was likely sciatica.  I ordered an EMG but they ultimately canceled that.  I tried her on Lyrica but that caused her to be excessively sleepy.  She went to the chiropractor and states that she is doing much better from that regard.  Unfortunately, she has had bronchitis and a urinary tract infection.  She has been on several antibiotics, which have caused her to be nauseated.  She has also had  dental work and had several of her teeth pulled.  Because of these things, she has had a decreased appetite and really has not been able to eat.  She had a temporary partial placed, but her appetite still hasn't been what it used to be, primarily because of the nausea from the antibiotic.  She has not been exercising because of being sick.  She is hoping to get back to that soon.  02/02/14 update:  The patient presents today with her husband, who supplements the history.  The patient is currently on carbidopa/levodopa 25/100, 1 tablet at 9 AM/1 PM and 2 tablets at 6 PM.   She is also on Comtan 200 mg, one tablet 3 times per day.   The discomfort in the foot is much better.  From a Parkinson's standpoint, she has been doing well.  No falls.  No hallucinations.  The patient was screened through our Parkinson's screening program on 01/16/2014 and was either at baseline or better than baseline at all therapies and is scheduled for re-screening in January.  She is not currently exercising.    06/05/14 update:  The patient presents today with her husband, who supplements the history.  The patient is currently on carbidopa/levodopa 25/100, 1 tablet at 9 AM/1 PM and 2 tablets at 6 PM.   She is also on Comtan 200 mg, one tablet 3 times per day.  No falls.  No lightheadedness or near syncope.  No hallucinations.   I reviewed records since last visit.  Weight loss has been a concern.  The dietician has talked with her about suggestions to help, and the patient states that she was told to take Carnation 3 times per day.  Pt states that the biggest issue that has caused weight loss has been persistent nausea, which has been going on for about 2 months.  They really do not think that it is related to the Parkinson's medications, because it did not start with changes in Parkinson's medications.  It actually seemed to start after a visit to a restaurant and then persisted.  She just feels nauseated, has lost taste for food and  has lost appetite.  She followed up with Dr. Kenton Kingfisher and was started on Zantac about 3 or 4 days ago and does feel better and her blood pressure medication was discontinued.  10/04/14 update:  The patient presents today with her husband, who supplements the history.  The patient is currently on carbidopa/levodopa 25/100, 1 tablet at 9 AM/1 PM and 2 tablets at 6 PM.   She is also on Comtan 200 mg, one tablet 3 times per day. We d/c her comtan  for a week to see if her GI upset changed at all and it did not so we restarted it since obviously that was not the etiology.  She continues to have GI upset in the AM.  She went to her PCP and she was given Zofran and states that it made it worse but she only took it once.  It is only the AM when she has nausea and she does better the rest of the day.    12/14/14 update:  The patient returns today, accompanied by her husband who supplements the history.  Records were reviewed since last visit.  She has a history of breast cancer and was having weight loss/anorexia and saw her oncologist.  She ended up having a CT chest, abdomen and pelvis and those were unremarkable for any new lesions.  She has lost about 13 pounds since March of last year.  She has been complaining about nausea the last several visits.  In fact, we've previously stopped her entacapone to see if that would help the nausea, but it did not.  She did not think that it was related at all to her levodopa.  She no longer has the nausea but in the AM she doesn't feel good.  She has difficulty describing what this means.  She remains on carbidopa/levodopa 25/100, 1 tablet at 9 AM/one tablet at 1 PM and 2 tablets at 6 PM.  She is still on entacapone, 200 mg 3 times a day.  They brought with them a list of blood pressures and while the SBP is generally high (170's-190's without med and 150's-180's with BP med) it was never over 200.  She states that she was off the BP med a few days and felt better but when she went  back on it she felt worse again.  She was told to go back on it because it her BP's were too high.  She is only on amlodipine 2.5 mg.    03/05/15 update:  The patient is following up today regarding her Parkinson's disease.  She is currently on carbidopa/levodopa 25/100, 1 tablet at 9 AM, 1 tablet at 1 PM and 2 tablets at 6 PM.  She takes entacapone with each of these dosages.  At last visit, she was very focused on her blood pressure and I asked her to follow-up with her primary care physician to see if she actually needed her blood pressure medication.  Today, the patient and her husband state that she was able to d/c her amlodipine and her BP has been well controlled. She states that the nausea is better.  She states that she gets up in the AM and takes her med and just feels jittery and anxious.  Her husband asks about trying to take a levodopa at bedtime to see if that helps.  When she takes her xanax, the anxiety does seem to get better.  They were thinking about asking PCP to see if she can go up on the medication.  Husband admits that balance sometimes off and cognition not great.  PT did not think that she needed walker per their hx.  She has gotten back to her exercise.   By 3-4 pm, pt feels well.    06/11/15 update:  The patient presents today for follow-up, accompanied by her husband who supplements the history.  She is on carbidopa/levodopa 25/100, 1 tablet at 9 AM, 1 tablet at 1 PM and 2 tablets at 6 PM.  She is on entacapone  200 mg 3 times a day and last visit we added carbidopa/levodopa 50/200 at night.  She didn't notice a big difference with that. Her husband isn't sure that she really gave it enough time to know.   Her biggest issue last visit was her anxiety and I told her to try an extra levodopa when she is anxious to see if that helps.  They tried that and it didn't help.    I did tell her that I did not want her taking daily Xanax.  Today, the patient states that the "only thing that helps  is xanax."   Unfortunately, she has not been exercising.  Her husband is undergoing tx daily for prostate CA and they are consumed with that.  Balance has been okay and her husband states that she is doing well in that regard.  No falls.  She is sleeping from 9pm until 5-6am. She is taking naps during the day.   09/11/15 update:  The patient follows up today, accompanied by her husband who supplements the history.  She is on carbidopa/levodopa 25/100, 1 tablet at 9 AM, 1 at 1 PM and 2 tablets at 6 PM.  I asked her to retry carbidopa/levodopa 50/200 at night last visit.  She is still on the entacapone 200 mg 3 times per day.  Last visit, I encouraged her to discontinue her daily Xanax and we started her on Wellbutrin XL for her anxiety.  Her husband thinks that she is still on it (and off of xanax) but wellbutrin is not on her medication list today.  Anxiety has been better per both patient and husband.  Husband states that Dr. Kenton Kingfisher told them to ask me about sertraline.  She was going to come in and talk to me on January 11, but ended up having increased hallucinations and was admitted to the hospital from January 11 to 08/11/2015.  She was treated for urinary tract infection although her urine culture ended up being negative.  Husband reports that she is 90-95% better from a mental standpoint.  No hallucinations since d/c from the hospital.  Doing home PT/OT/ST and nurse comes once per week.    01/09/16 update:  The patient follows up today, accompanied by her husband who supplements the history.  She is on carbidopa/levodopa 25/100, 1 tablet at 9 AM, 1 at 1 PM and 2 tablets at 6 PM.  For some reason, her carbidopa/levodopa 50/200 got accidentally stopped, and I told them to just go ahead and hold that for now.  She is still on the entacapone 200 mg 3 times per day.  Last visit, her husband was not sure if she was still on the Wellbutrin and ended up calling me back.  This was stopped during her hospitalization.   She, therefore, started on Zoloft instead right after our last visit.  She took a single 25 mg tablet and felt nauseated, but I reminded her that she feels nauseated a lot in the morning and asked her to retry it, telling her that she could take it before bedtime and then work up to 50 mg at night.  She is only on 25 mg and husband states that she is doing well and anxiety is better.  Only time an issue is that she hates to get in a car and travel.    Nausea is also better.    05/13/16 update:  The patient follows up today, accompanied by her husband who supplements the history.  She is on  carbidopa/levodopa 25/100, 1 tablet at 9 AM, 1 at 1 PM and 2 tablets at 6 PM.  She is on entacapone 200 mg 3 times a day with each of these dosages.  For the anxiety and depression, she is on Zoloft which has been increased to 50 mg daily.  That has helped.  She was hospitalized from 9/18-9/22 for CAP.  The records that were made available to me were reviewed. Having a facet joint injection tomorrow by Dr. Jacelyn Grip for back pain.  Not been able to get back to exercise because of pneumonia and back pain.  Has PD screen on 11/28.  08/19/16 update:  The patient follows up today, accompanied by her husband who supplements the history.  She is on carbidopa/levodopa 25/100, 1 tablet at 9 AM, 1 at 1 PM and 2 tablets at 6 PM.  She is on entacapone 200 mg 3 times a day with each of these dosages.  For the anxiety and depression, she is on Zoloft which has been increased to 50 mg daily.  Husband states that "she is not as happy as the gal I married."  The records that were made available to me were reviewed.  Pt denies falls.  Pt denies lightheadedness, near syncope.  No hallucinations.  Husband states that yesterday was a really bad day both physically and mentally (more confused and had help getting dressed).  Is much better today.  Appetite is overall down.  Drinking plenty of water.  Not exercising.  Is sleeping a lot per husband.  Husband states that was having back pain.  Went and had rhizotomy and that helped and then PT ordered but husband wants it at neurorehab center as patient very unsteady.   Neuroimaging has  previously been performed.  An MRI brain was last done in 2010 but pt can no longer have MRI's because of an implantable hearing device.    PREVIOUS MEDICATIONS: none to date  ALLERGIES:  No Known Allergies  CURRENT MEDICATIONS:  Current Outpatient Prescriptions on File Prior to Visit  Medication Sig Dispense Refill  . acetaminophen (TYLENOL) 325 MG tablet Take 650 mg by mouth every 6 (six) hours as needed for moderate pain.     [name] acyclovir (ZOVIRAX) 400 MG tablet TAKE 1 TABLET BY MOUTH TWICE DAILY 180 tablet 3  . bisacodyl (DULCOLAX) 5 MG EC tablet Take 5 mg by mouth daily as needed for moderate constipation.    . Calcium Carbonate-Vitamin D3 (CALCIUM 600-D) 600-400 MG-UNIT TABS Take 1 tablet by mouth at bedtime.    . carbidopa-levodopa (SINEMET IR) 25-100 MG tablet 1 tablet in the morning by mouth, 1 in the afternoon by mouth, 2 in the evening by mouth. 360 tablet 1  . cetirizine (ZYRTEC) 10 MG tablet Take 10 mg by mouth daily at 12 noon.     . cholecalciferol (VITAMIN D) 1000 units tablet Take 1,000 Units by mouth every evening.     . entacapone (COMTAN) 200 MG tablet TAKE 1 TABLET (200 MG TOTAL) BY MOUTH 3 (THREE) TIMES DAILY. (Patient taking differently: TAKE 1 TABLET (200 MG TOTAL) BY MOUTH 3 (THREE) TIMES DAILY - take with Sinemet) 270 tablet 3  . Fluticasone-Salmeterol (ADVAIR) 250-50 MCG/DOSE AEPB Inhale 1 puff into the lungs 2 (two) times daily.    . meloxicam (MOBIC) 15 MG tablet Take 15 mg by mouth daily.    . methocarbamol (ROBAXIN) 500 MG tablet Take 500 mg by mouth 4 (four) times daily.    . mirabegron ER (  MYRBETRIQ) 50 MG TB24 tablet Take 50 mg by mouth daily at 12 noon. Reported on 01/30/2016    . Multiple Vitamins-Minerals (MULTIVITAMIN WITH MINERALS) tablet Take 1 tablet by mouth daily  with breakfast.     . ranitidine (ZANTAC) 300 MG tablet Take 300 mg by mouth daily as needed for heartburn.     . sertraline (ZOLOFT) 25 MG tablet Take one tablet daily for one week, then take two tablets once daily (Patient taking differently: Take 25 mg by mouth at bedtime. ) 60 tablet 5   No current facility-administered medications on file prior to visit.     PAST MEDICAL HISTORY:   Past Medical History:  Diagnosis Date  . Anxiety   . Asthma   . Depression   . Diabetes mellitus without complication ([place])    [id number].Marland KitchenMarland Kitchenpt denies  . GERD (gastroesophageal reflux disease)   . Hearing loss   . NHL (non-Hodgkin's lymphoma) ([place])    nhl dx 9/04 breast ca dx1/12  . Parkinson disease ([place])     PAST SURGICAL HISTORY:   Past Surgical History:  Procedure Laterality Date  . Ba-HA Ear implant    . MASTECTOMY  2 /8/ 12   bilateral  . MASTECTOMY      SOCIAL HISTORY:   Social History   Social History  . Marital status: Married    Spouse name: N/A  . Number of children: N/A  . Years of education: N/A   Occupational History  . retired Retired    Pharmacist, hospital   Social History Main Topics  . Smoking status: Never Smoker  . Smokeless tobacco: Never Used     Comment: husband wsa smoker  . Alcohol use No     Comment: no  . Drug use: No  . Sexual activity: Not on file   Other Topics Concern  . Not on file   Social History Narrative  . No narrative on file    FAMILY HISTORY:   Family Status  Relation Status  . Father Deceased   CA, lung  . Mother Deceased   CA; complications of splenectomy  . Brother Deceased   3, renal failure, DM-2; CAD; CA  . Child Alive   5 (4 biological), one with PKU    ROS:  A complete 10 system review of systems was obtained and was unremarkable apart from what is mentioned above.  PHYSICAL EXAMINATION:    VITALS:   Vitals:   08/19/16 1116  BP: 110/60  Pulse: 90  Weight: 133 lb (60.3 kg)  Height: 5\' 1"  (1.549 m)   Wt Readings  from Last 3 Encounters:  08/19/16 133 lb (60.3 kg)  08/06/16 133 lb 11.2 oz (60.6 kg)  05/13/16 128 lb (58.1 kg)     GEN:  The patient appears stated age and is in NAD. CV:  RRR with 3/6 SEM Lungs:  CTAB Neck:  No bruits but cardiac murmur radiates to the bilateral carotids   Neurological examination:  Orientation: She is alert and oriented to person and place.   Montreal Cognitive Assessment  05/13/2016 09/11/2015 12/14/2014  Visuospatial/ Executive (0/5) 3 3 3   Naming (0/3) 2 3 3   Attention: Read list of digits (0/2) 2 1 2   Attention: Read list of letters (0/1) 1 1 1   Attention: Serial 7 subtraction starting at 100 (0/3) 0 0 0  Language: Repeat phrase (0/2) 1 0 1  Language : Fluency (0/1) 1 1 1   Abstraction (0/2) 1 1 1   Delayed Recall (  0/5) 0 0 0  Orientation (0/6) 5 6 6   Total 16 16 18   Adjusted Score (based on education) 17 17 19    Cranial nerves: There is good facial symmetry.  The visual fields are full to confrontational testing. The speech is fluent and clear.  She is hypophonic.  Soft palate rises symmetrically and there is no tongue deviation. Hearing is intact to conversational tone. Sensation: Sensation is intact to light outh throughout. Motor: Strength is 5/5 in the bilateral upper and lower extremities.   Shoulder shrug is equal and symmetric.  There is no pronator drift.   Movement examination: Tone: There is no rigidity. Abnormal movements: There is rare tremor in both UE that is low amplitude Coordination:  There is no decremation noted today Gait and Station: The patient no has trouble arising out of the chair without use of her hands today.  Stride length and arm swing are good.  She has pisa syndrome Labs:  Lab Results  Component Value Date   WBC 7.0 08/06/2016   HGB 10.8 (L) 08/06/2016   HCT 32.7 (L) 08/06/2016   MCV 88.4 08/06/2016   PLT 215 08/06/2016     Chemistry      Component Value Date/Time   NA 143 08/06/2016 1149   K 4.5 08/06/2016  1149   CL 107 04/16/2016 0550   CL 106 12/26/2012 1110   CO2 29 08/06/2016 1149   BUN 15.7 08/06/2016 1149   CREATININE 1.1 08/06/2016 1149      Component Value Date/Time   CALCIUM 9.9 08/06/2016 1149   ALKPHOS 127 08/06/2016 1149   AST 19 08/06/2016 1149   ALT 9 08/06/2016 1149   BILITOT 0.29 08/06/2016 1149      Lab Results  Component Value Date   VITAMINB12 1053 (H) 06/10/2006   Lab Results  Component Value Date   TSH 2.93 09/19/2012    ASSESSMENT/PLAN:  1.  Idiopathic Parkinson's disease, dx 08/2012.  This is evidenced by bradykinesia, tremor, postural instability and rigidity.  There are no atypical features.  -We discussed the diagnosis as well as pathophysiology of the disease.  We discussed treatment options as well as prognostic indicators.  Patient education was provided.  - she is currently on carbidopa/levodopa 25/100 one tablet at 9 AM/one tablet at 1 PM and 2 tablets at 6 PM and will continue this.  She needs to avoid protein when taking levodopa and discussed this.  Risks, benefits, side effects and alternative therapies were discussed.  The opportunity to ask questions was given and they were answered to the best of my ability.  The patient expressed understanding and willingness to follow the outlined treatment protocols.  -Pt is on entacapone tid and she will continue on this.    -was supposed to restart carbidopa/levodopa 50/200 q hs previously but didn't and decided to hold on that for now.  -Order for PT 2.  Anxiety  -doing well on zoloft 50 mg at night. They are going to ask prescribing physician.    3.  Cardiac murmur  -being followed yearly by cardiology for mod AS 4.  PDD  -dont want to add further meds right now.  Is free of hallucinations but if occur again, will consider nuplazid.   -talked about importance of mental and physical activities.  Talked to her about being social and going out with friends.    -told her she needs to follow a regular  schedule 5. Follow up is anticipated in the next few  months, sooner should new neurologic issues arise.  Much greater than 50% of this visit was spent in counseling and coordinating care.  Total face to face time:  40 min

## 2016-08-19 ENCOUNTER — Encounter: Payer: Self-pay | Admitting: Neurology

## 2016-08-19 ENCOUNTER — Ambulatory Visit (INDEPENDENT_AMBULATORY_CARE_PROVIDER_SITE_OTHER): Payer: Medicare Other | Admitting: Neurology

## 2016-08-19 VITALS — BP 110/60 | HR 90 | Ht 61.0 in | Wt 133.0 lb

## 2016-08-19 DIAGNOSIS — G20 Parkinson's disease: Secondary | ICD-10-CM

## 2016-08-19 DIAGNOSIS — F0280 Dementia in other diseases classified elsewhere without behavioral disturbance: Secondary | ICD-10-CM

## 2016-08-19 NOTE — Patient Instructions (Addendum)
Here is your homework:  1.  Socialize with your friends  2.  Find a new activity that you can learn and do it daily  3.  Ride on the stationary bike  4.  I want you to go home and make a daily schedule and follow that schedule.  I will send an order for physical therapy

## 2016-08-19 NOTE — Addendum Note (Signed)
Addended byAnnamaria Helling on: 08/19/2016 11:48 AM   Modules accepted: Orders

## 2016-08-20 DIAGNOSIS — S29011A Strain of muscle and tendon of front wall of thorax, initial encounter: Secondary | ICD-10-CM | POA: Diagnosis not present

## 2016-08-20 DIAGNOSIS — R05 Cough: Secondary | ICD-10-CM | POA: Diagnosis not present

## 2016-08-20 DIAGNOSIS — S50812A Abrasion of left forearm, initial encounter: Secondary | ICD-10-CM | POA: Diagnosis not present

## 2016-08-21 ENCOUNTER — Telehealth: Payer: Self-pay | Admitting: Neurology

## 2016-08-21 NOTE — Telephone Encounter (Signed)
FYI

## 2016-08-21 NOTE — Telephone Encounter (Signed)
I'm so sorry to hear this!  Thank you for letting us know

## 2016-08-21 NOTE — Telephone Encounter (Signed)
[name] 01/15/1928. Her # [phone]. [name] her husband took her into the walk in clinic on  Wednesday evening after her visit with Dr. [name]. He said she fell . She is bruised up and she was given pain medication. He wanted to let you know. Thank you

## 2016-08-27 DIAGNOSIS — M47817 Spondylosis without myelopathy or radiculopathy, lumbosacral region: Secondary | ICD-10-CM | POA: Diagnosis not present

## 2016-08-27 DIAGNOSIS — M47816 Spondylosis without myelopathy or radiculopathy, lumbar region: Secondary | ICD-10-CM | POA: Diagnosis not present

## 2016-08-28 ENCOUNTER — Telehealth: Payer: Self-pay | Admitting: Neurology

## 2016-08-28 NOTE — Telephone Encounter (Signed)
[name] 07/07/1928. Her husband [name] called needing to speak with you regarding his wife. He said her symptoms are getting worse. His # is [phone]. Thank you

## 2016-08-28 NOTE — Telephone Encounter (Signed)
Spoke with patient's husband and he states patient has not done well the last couple of days. She seems more disoriented. Didn't sleep well two nights ago, but seemed to sleep okay last night. She had lumbar radiofrequency treatments two days ago and his doing okay from that. No hallucinations. She was only put on Aleve 500 mg from her fall last week- no stronger pain meds. She has had two episodes where she tried to go to the toilet and only sits on it half way and ends up making a mess. She may have some frequency. He is planning on taking her to the urgent care to r/o UTI. I advised this was a good plan. Please advise if any other options.

## 2016-08-28 NOTE — Telephone Encounter (Signed)
Patient's husband made aware.  

## 2016-08-28 NOTE — Telephone Encounter (Signed)
Unfortunately, I think he is just seeing progression of disease/dementia.  Hold entacapone.  Agree with UC to r/o UTI

## 2016-08-29 DIAGNOSIS — N390 Urinary tract infection, site not specified: Secondary | ICD-10-CM | POA: Diagnosis not present

## 2016-08-29 DIAGNOSIS — R0982 Postnasal drip: Secondary | ICD-10-CM | POA: Diagnosis not present

## 2016-09-08 ENCOUNTER — Ambulatory Visit: Payer: Medicare Other | Attending: Neurology | Admitting: Physical Therapy

## 2016-09-08 DIAGNOSIS — R2681 Unsteadiness on feet: Secondary | ICD-10-CM | POA: Diagnosis not present

## 2016-09-08 DIAGNOSIS — G8929 Other chronic pain: Secondary | ICD-10-CM | POA: Diagnosis not present

## 2016-09-08 DIAGNOSIS — M6281 Muscle weakness (generalized): Secondary | ICD-10-CM | POA: Diagnosis not present

## 2016-09-08 DIAGNOSIS — R2689 Other abnormalities of gait and mobility: Secondary | ICD-10-CM | POA: Diagnosis not present

## 2016-09-08 DIAGNOSIS — M545 Low back pain: Secondary | ICD-10-CM | POA: Diagnosis not present

## 2016-09-08 DIAGNOSIS — R293 Abnormal posture: Secondary | ICD-10-CM | POA: Insufficient documentation

## 2016-09-08 NOTE — Patient Instructions (Signed)
Sit to Stand Transfers:  1. Scoot out to the edge of the chair 2. Place your feet flat on the floor, shoulder width apart.  Make sure your feet are tucked just under your knees. 3. Lean forward (nose over toes) with momentum, and stand up tall with your best posture.  If you need to use your arms, use them as a quick boost up to stand. 4. If you are in a low or soft chair, you can lean back and then forward up to stand, in order to get more momentum. 5. Once you are standing, make sure you are looking ahead and standing tall.  To sit down:  1. Back up until you feel the chair behind your legs. 2. Bend at you hips, reaching  Back for you chair, if needed, then slowly squat to sit down on your chair. 

## 2016-09-08 NOTE — Therapy (Signed)
[facility] [address] [place], [name], [address] Phone: [phone]   Fax:  [phone]  Physical Therapy Evaluation  Patient Details  Name: [name] MRN: [id number] Date of Birth: 05-May-1928 Referring Provider: Alonza Bogus  Encounter Date: 09/08/2016      PT End of Session - 09/08/16 1314    Visit Number 1   Number of Visits 18   Date for PT Re-Evaluation 11/07/16   Authorization Type Medicare primary, UHC secondary-GCODE every 10th visit   PT Start Time 1020   PT Stop Time 1101   PT Time Calculation (min) 41 min   Activity Tolerance Patient tolerated treatment well   Behavior During Therapy [facility] for tasks assessed/performed      Past Medical History:  Diagnosis Date  . Anxiety   . Asthma   . Depression   . Diabetes mellitus without complication ([place])    [id number].Marland KitchenMarland Kitchenpt denies  . GERD (gastroesophageal reflux disease)   . Hearing loss   . NHL (non-Hodgkin's lymphoma) ([place])    nhl dx 9/04 breast ca dx1/12  . Parkinson disease [facility])     Past Surgical History:  Procedure Laterality Date  . Ba-HA Ear implant    . MASTECTOMY  2 /8/ 12   bilateral  . MASTECTOMY      There were no vitals filed for this visit.       Subjective Assessment - 09/08/16 1026    Subjective Husband reports some new medical issues.  He reports she had the flu earlier this year, with one fall within the past month.  She was standing and then fell, with bruising on LUE.  Husband reports increased fear of falling.  She is using cane since she fell.   Patient is accompained by: Family member  Husband   Pertinent History Lumbar radiofrequency ablation (to treat lumbar facet joint pain)-last one 08/27/16 with relief of back pain; dementia due to Parkinson's; DVT (07/2016 with aspirin x 10 days treatment)   Patient Stated Goals Pt's goals for therapy are to improve balance and get away from cane.   Currently in Pain? Yes  No  pain at eval, but earlier this morning   Pain Score 5    Pain Location Back   Pain Orientation Mid;Lower   Pain Descriptors / Indicators Aching   Pain Type Chronic pain   Pain Onset More than a month ago   Pain Frequency Intermittent   Aggravating Factors  Standing too long    Pain Relieving Factors Sitting and resting   Effect of Pain on Daily Activities PT will monitor pain, but will not address as a goal at this time, due to pain being managed medically            [facility] PT Assessment - 09/08/16 1034      Assessment   Medical Diagnosis Parkinson's disease   Referring Provider Tat, Wells Guiles   Onset Date/Surgical Date --  January MD visit     Precautions   Precautions Fall     Balance Screen   Has the patient fallen in the past 6 months Yes   How many times? 1   Has the patient had a decrease in activity level because of a fear of falling?  Yes   Is the patient reluctant to leave their home because of a fear of falling?  Yes     [place] Private residence   Living Arrangements Spouse/significant other   Available  Help at Discharge Family   Type of [place] to enter   Entrance Stairs-Number of Steps 3 + 1   Entrance Stairs-Rails Right;Left;Can reach both   [place] One level   [place] - 4 wheels;Cane - single point  [facility] with tripod base     Prior Function   Level of Independence Independent with basic ADLs;Independent with household mobility without device;Independent with community mobility without device   Leisure Enjoys doing her chores at home, goes to church, goes out to eat     Posture/Postural Control   Posture/Postural Control Postural limitations   Postural Limitations Rounded Shoulders;Forward head  R shoulder higher than L     ROM / Strength   AROM / PROM / Strength Strength     Strength   Overall Strength Deficits   Overall Strength Comments Grossly tested hip flexion RLE 4/5,  LLE 3+/5, bilateral quads 3+/5, bilateral hamstrings 4/5, ankle dorsiflexion 4/5 bilateral     Transfers   Transfers Sit to Stand;Stand to Sit   Sit to Stand 5: Supervision;4: Min guard;Without upper extremity assist;From chair/3-in-1;Multiple attempts   Sit to Stand Details (indicate cue type and reason) Multiple attempts to stand   Five time sit to stand comments  25.87   Stand to Sit 5: Supervision;4: Min guard;Without upper extremity assist;To chair/3-in-1   Comments Pt reports unsteadiness upon standing at times.       Ambulation/Gait   Ambulation/Gait Yes   Ambulation/Gait Assistance 5: Supervision   Ambulation Distance (Feet) 200 Feet   Assistive device Straight cane  small tripod base   Gait Pattern Step-through pattern;Decreased arm swing - left;Decreased step length - left;Narrow base of support   Ambulation Surface Level;Indoor   Gait velocity 23.37 sec = 1.4 ft/sec     Standardized Balance Assessment   Standardized Balance Assessment Timed Up and Go Test     Timed Up and Go Test   Normal TUG (seconds) 21.94  with small base tripod cane                   [facility] Adult PT Treatment/Exercise - 09/08/16 1034      Transfers   Number of Reps --  5 reps   Transfer Cueing Cues for proper transfer technique:  scoot to edge of chair, widened BOS, forward lean and upright stand, with lateral weightshift upon standing.  Cues provided for hand placement.                PT Education - 09/08/16 1312    Education provided Yes   Education Details Sit<>stand transfer technique   Person(s) Educated Patient;Spouse   Methods Explanation;Demonstration;Verbal cues;Handout   Comprehension Verbalized understanding;Returned demonstration;Verbal cues required;Need further instruction          PT Short Term Goals - 09/08/16 1413      PT SHORT TERM GOAL #1   Title Pt will perform HEP with family supervision, for improved balance, transfers, gait.  TARGET 10/07/16    Time 4   Period Weeks   Status New     PT SHORT TERM GOAL #2   Title Pt will improve 5x sit<>stand to less than or equal to 20 seconds for improved transfer efficiency and safety.   Time 4   Period Weeks   Status New     PT SHORT TERM GOAL #3   Title Berg Balance test to be assessed, with pt to improve by at least 5  points, for decreased fall risk.   Time 4   Period Weeks   Status New     PT SHORT TERM GOAL #4   Title Pt will improve TUG score to less than or equal to 18 seconds for decreased fall risk.   Time 4   Period Weeks   Status New           PT Long Term Goals - September 23, 2016 1416      PT LONG TERM GOAL #1   Title Pt/family will verbalize understanding of fall prevention in home environment.  TARGET 11/07/16   Time 9   Period Weeks   Status New     PT LONG TERM GOAL #2   Title Pt will improve 5x sit<>stand to less than or equal to 15 seconds for improved ease and efficiency with transfers.   Time 9   Period Weeks   Status New     PT LONG TERM GOAL #3   Title Pt will improve TUG score to less than or equal to 13.5 seconds for decreased fall risk.   Time 9   Period Weeks   Status New     PT LONG TERM GOAL #4   Title Pt will improve gait velocity ot at least 1.8 ft/sec for improved gait efficiency and safety.     Time 9   Period Weeks   Status New               Plan - 09/23/16 1407    Clinical Impression Statement Pt is an 83 year old female who presents to PT with history of Parkinson's disease, dementia due to PD, with one fall in the past month.  Pt's husband reports decline in mobility due to pt's increased fear of falling.  Pt presents with decreased balance, decreased transfer efficiency, decreased safety and efficiency with gait, abnormal posture, bradykinesia.  Pt is at fall risk per TUG and gait velocity scores.  Pt will benefit from skilled physical therapy to address the above stated deficits to improve functional mobility and decrease fall  risk.   Rehab Potential Good   PT Frequency 2x / week  1x/1wk, then    PT Duration 8 weeks  plus eval   PT Treatment/Interventions ADLs/Self Care Home Management;Functional mobility training;Gait training;DME Instruction;Therapeutic activities;Therapeutic exercise;Balance training;Neuromuscular re-education;Patient/family education   PT Next Visit Plan Review and practice transfer training, check BP with sit<>stand/further assess pt's c/o dizziness upon standing; initiate HEP-possibly OTAGO   Consulted and Agree with Plan of Care Patient;Family member/caregiver      Patient will benefit from skilled therapeutic intervention in order to improve the following deficits and impairments:  Abnormal gait, Decreased balance, Decreased mobility, Decreased strength, Difficulty walking, Postural dysfunction, Decreased endurance  Visit Diagnosis: Other abnormalities of gait and mobility  Muscle weakness (generalized)  Unsteadiness on feet  Abnormal posture      G-Codes - September 23, 2016 1418    Functional Assessment Tool Used 5x sit<>stand 25.87 sec, gait velocity 1.4 ft/sec; TUG 21.94 sec with cane; 1 fall in past 6 months   Functional Limitation Mobility: Walking and moving around   Mobility: Walking and Moving Around Current Status [phone]) At least 40 percent but less than 60 percent impaired, limited or restricted   Mobility: Walking and Moving Around Goal Status [phone]) At least 20 percent but less than 40 percent impaired, limited or restricted       Problem List Patient Active Problem List   Diagnosis Date Noted  .  CAP (community acquired pneumonia) 04/13/2016  . PNA (pneumonia) 04/13/2016  . Murmur 09/02/2015  . Parkinson disease ([place]) 09/02/2015  . Bilateral carotid bruits 09/02/2015  . Confusion 08/07/2015  . Acute encephalopathy 08/07/2015  . UTI (lower urinary tract infection) 08/07/2015  . Acute kidney failure ([place]) 08/07/2015  . Paralysis agitans ([place]) 09/19/2012  . Lymphoma  ([place]) 07/03/2011  . Malignant neoplasm of female breast ([employer]) 07/03/2011  . Abnormal CT of the chest 03/13/2011  . HYPERTENSION, BENIGN 01/01/2009  . EDEMA 01/01/2009    Hillel Card W. 09/08/2016, 2:21 PM  Frazier Butt., PT   [facility] [address] [place], [name], [address] Phone: [phone]   Fax:  [phone]  Name: Kimla Isgro MRN: [id number] Date of Birth: [birth year]

## 2016-09-10 ENCOUNTER — Ambulatory Visit: Payer: Medicare Other | Admitting: Physical Therapy

## 2016-09-10 VITALS — BP 87/47 | HR 77

## 2016-09-10 DIAGNOSIS — R293 Abnormal posture: Secondary | ICD-10-CM | POA: Diagnosis not present

## 2016-09-10 DIAGNOSIS — R2681 Unsteadiness on feet: Secondary | ICD-10-CM | POA: Diagnosis not present

## 2016-09-10 DIAGNOSIS — G8929 Other chronic pain: Secondary | ICD-10-CM | POA: Diagnosis not present

## 2016-09-10 DIAGNOSIS — R2689 Other abnormalities of gait and mobility: Secondary | ICD-10-CM | POA: Diagnosis not present

## 2016-09-10 DIAGNOSIS — M6281 Muscle weakness (generalized): Secondary | ICD-10-CM | POA: Diagnosis not present

## 2016-09-10 DIAGNOSIS — M545 Low back pain: Secondary | ICD-10-CM | POA: Diagnosis not present

## 2016-09-10 NOTE — Patient Instructions (Addendum)
  Seated Alternating Leg Raise (Marching)    Sit on ball. Raise bent knee and return. Repeat with other leg. Do _1-2__ sets of _10__ repetitions.  Copyright  VHI. All rights reserved.  KNEE: Extension, Long Arc Quads - Sitting    Raise leg until knee is straight. _10__ reps per set, _1-2__ sets per day  Copyright  VHI. All rights reserved.  Toe / Heel Raise (Sitting)    Sitting, raise heels, then rock back on heels and raise toes. Repeat _10___ times.  Copyright  VHI. All rights reserved.

## 2016-09-11 ENCOUNTER — Telehealth: Payer: Self-pay | Admitting: Neurology

## 2016-09-11 DIAGNOSIS — M47816 Spondylosis without myelopathy or radiculopathy, lumbar region: Secondary | ICD-10-CM | POA: Diagnosis not present

## 2016-09-11 NOTE — Telephone Encounter (Signed)
Left message giving patient instructions. 

## 2016-09-11 NOTE — Telephone Encounter (Signed)
Note received from PT re: BP's:  Orthostatic blood pressure measurement: Supine:  185/89 Stand after 1 minute:  113/52 with c/o wooziness Stand after 3 minutes:  128/65  I didn't see pulses recorded but apparently c/o dizziness.  While BP did drop a lot, we would not want to give her med to raise the BP given that it is so high while supine and really not that low when standing (its just that it dropped).  Make sure that she is hydrating well (like 60 oz water a day) - ask her husband and if not better could always give her compression binder but see what cardiologist says at her appt next week first

## 2016-09-11 NOTE — Therapy (Signed)
[facility] [address] [place], [name], [address] Phone: [phone]   Fax:  [phone]  Physical Therapy Treatment  Patient Details  Name: [name] MRN: [id number] Date of Birth: 1928-02-27 Referring Provider: Alonza Bogus  Encounter Date: 09/10/2016      PT End of Session - 09/11/16 1338    Visit Number 2   Number of Visits 18   Date for PT Re-Evaluation 11/07/16   Authorization Type Medicare primary, UHC secondary-GCODE every 10th visit   PT Start Time 1318   PT Stop Time 1402   PT Time Calculation (min) 44 min   Activity Tolerance Patient tolerated treatment well   Behavior During Therapy [facility] for tasks assessed/performed      Past Medical History:  Diagnosis Date  . Anxiety   . Asthma   . Depression   . Diabetes mellitus without complication ([place])    [id number].Marland KitchenMarland Kitchenpt denies  . GERD (gastroesophageal reflux disease)   . Hearing loss   . NHL (non-Hodgkin's lymphoma) ([place])    nhl dx 9/04 breast ca dx1/12  . Parkinson disease [facility])     Past Surgical History:  Procedure Laterality Date  . Ba-HA Ear implant    . MASTECTOMY  2 /8/ 12   bilateral  . MASTECTOMY      Vitals:   09/10/16 1327 09/10/16 1328  BP: 124/67 (!) 87/47  Pulse: 77         Subjective Assessment - 09/10/16 1324    Subjective Feels better today.  Had a good Luci's Day-husband reports they went to church for [place] and she showered and put on her own makeup today.   Patient is accompained by: Family member  Husband   Pertinent History Lumbar radiofrequency ablation (to treat lumbar facet joint pain)-last one 08/27/16 with relief of back pain; dementia due to Parkinson's; DVT (07/2016 with aspirin x 10 days treatment)   Patient Stated Goals Pt's goals for therapy are to improve balance and get away from cane.   Currently in Pain? No/denies   Pain Onset More than a month ago                          [facility] Adult PT Treatment/Exercise - 09/11/16 0001      Transfers   Transfers Sit to Stand;Stand to Sit   Sit to Stand 5: Supervision;4: Min guard;Without upper extremity assist;From chair/3-in-1;Multiple attempts   Stand to Sit 5: Supervision;4: Min guard;Without upper extremity assist;To chair/3-in-1   Number of Reps Other reps (comment)  2 sets of 5 reps   Transfer Cueing Cues provided for technique of sit<>stand transfers, hand placement if needed   Comments Review of sit<>stand technique from previous visit.  Pt needs repeated verbal cues for technique.     Self-Care   Self-Care Other Self-Care Comments   Other Self-Care Comments  Spent time assessing blood pressure (sit and stand at beginning of session), then orthostatic BP measures toward end of session.  Discussed fluctuations in blood pressure with position changes with patient and husband; discussed using lower extremity exercises when sitting long periods prior to standing; need to stand and get balance prior to walking.  Instructed husband in assisting patient to sit if requested, as pt's blood pressure may be dropping.  Pt to have cardiologist appt next week-provided paitent and husband with copy of blood pressure measures.     Exercises   Exercises Knee/Hip     Knee/Hip  Exercises: Seated   Marching AROM;Right;Left;1 set;10 reps             Balance Exercises - 09/11/16 1331      OTAGO PROGRAM   Head Movements Sitting;5 reps   Neck Movements Sitting;5 reps   Ankle Movements Sitting;10 reps   Knee Extensor 10 reps           PT Education - 09/11/16 1337    Education provided Yes   Education Details HEP-see instructions; orthostatic blood pressure measurements   Person(s) Educated Patient;Spouse   Methods Explanation;Demonstration   Comprehension Verbalized understanding;Returned demonstration;Need further instruction;Verbal cues required     Orthostatic blood pressure measurement: Supine:  185/89 Stand  after 1 minute:  113/52 with c/o wooziness Stand after 3 minutes:  128/65      PT Short Term Goals - 09/08/16 1413      PT SHORT TERM GOAL #1   Title Pt will perform HEP with family supervision, for improved balance, transfers, gait.  TARGET 10/07/16   Time 4   Period Weeks   Status New     PT SHORT TERM GOAL #2   Title Pt will improve 5x sit<>stand to less than or equal to 20 seconds for improved transfer efficiency and safety.   Time 4   Period Weeks   Status New     PT SHORT TERM GOAL #3   Title Berg Balance test to be assessed, with pt to improve by at least 5 points, for decreased fall risk.   Time 4   Period Weeks   Status New     PT SHORT TERM GOAL #4   Title Pt will improve TUG score to less than or equal to 18 seconds for decreased fall risk.   Time 4   Period Weeks   Status New           PT Long Term Goals - 09/08/16 1416      PT LONG TERM GOAL #1   Title Pt/family will verbalize understanding of fall prevention in home environment.  TARGET 11/07/16   Time 9   Period Weeks   Status New     PT LONG TERM GOAL #2   Title Pt will improve 5x sit<>stand to less than or equal to 15 seconds for improved ease and efficiency with transfers.   Time 9   Period Weeks   Status New     PT LONG TERM GOAL #3   Title Pt will improve TUG score to less than or equal to 13.5 seconds for decreased fall risk.   Time 9   Period Weeks   Status New     PT LONG TERM GOAL #4   Title Pt will improve gait velocity ot at least 1.8 ft/sec for improved gait efficiency and safety.     Time 9   Period Weeks   Status New               Plan - 09/11/16 1338    Clinical Impression Statement Treatment session focused on lower extremity strengthening exercises (initiated as part of OTAGO), but not able to perform standing exercises due to blood pressure issues.  Pt's blood pressure is dropping with positional changes, likely contributing to pt's feelings of wooziness with  standing and gait.  PT to make neurologist aware.  Pt/husband provided with BP measures for cardiologist visit next week.   Rehab Potential Good   PT Frequency 2x / week  1x/1wk, then    PT  Duration 8 weeks  plus eval   PT Treatment/Interventions ADLs/Self Care Home Management;Functional mobility training;Gait training;DME Instruction;Therapeutic activities;Therapeutic exercise;Balance training;Neuromuscular re-education;Patient/family education   PT Next Visit Plan Review HEP and progress with OTAGO and gait as able.  Check BP measures sititng and standing   Consulted and Agree with Plan of Care Patient;Family member/caregiver      Patient will benefit from skilled therapeutic intervention in order to improve the following deficits and impairments:  Abnormal gait, Decreased balance, Decreased mobility, Decreased strength, Difficulty walking, Postural dysfunction, Decreased endurance  Visit Diagnosis: Muscle weakness (generalized)  Unsteadiness on feet     Problem List Patient Active Problem List   Diagnosis Date Noted  . CAP (community acquired pneumonia) 04/13/2016  . PNA (pneumonia) 04/13/2016  . Murmur 09/02/2015  . Parkinson disease ([place]) 09/02/2015  . Bilateral carotid bruits 09/02/2015  . Confusion 08/07/2015  . Acute encephalopathy 08/07/2015  . UTI (lower urinary tract infection) 08/07/2015  . Acute kidney failure ([place]) 08/07/2015  . Paralysis agitans ([place]) 09/19/2012  . Lymphoma ([place]) 07/03/2011  . Malignant neoplasm of female breast ([place]) 07/03/2011  . Abnormal CT of the chest 03/13/2011  . HYPERTENSION, BENIGN 01/01/2009  . EDEMA 01/01/2009    MARRIOTT,AMY W. 09/11/2016, 1:42 PM  Frazier Butt., PT [facility] [address] [place], [name], [address] Phone: [phone]   Fax:  [phone]  Name: Aquia Hettinger MRN: [id number] Date of Birth: [birth year]

## 2016-09-14 ENCOUNTER — Ambulatory Visit: Payer: Medicare Other | Admitting: Physical Therapy

## 2016-09-14 VITALS — BP 124/70 | HR 77

## 2016-09-14 DIAGNOSIS — G8929 Other chronic pain: Secondary | ICD-10-CM | POA: Diagnosis not present

## 2016-09-14 DIAGNOSIS — R2689 Other abnormalities of gait and mobility: Secondary | ICD-10-CM

## 2016-09-14 DIAGNOSIS — R2681 Unsteadiness on feet: Secondary | ICD-10-CM

## 2016-09-14 DIAGNOSIS — M6281 Muscle weakness (generalized): Secondary | ICD-10-CM | POA: Diagnosis not present

## 2016-09-14 DIAGNOSIS — M545 Low back pain: Secondary | ICD-10-CM

## 2016-09-14 DIAGNOSIS — R293 Abnormal posture: Secondary | ICD-10-CM

## 2016-09-14 NOTE — Therapy (Signed)
[facility] [address] [place], [name], [address] Phone: [phone]   Fax:  [phone]  Patient Details  Name: [name] MRN: [id number] Date of Birth: 1927/09/01 Referring Provider:  No ref. provider found  Encounter Date: 09/14/2016  SPEECH THERAPY DISCHARGE SUMMARY  Visits from Start of Care: 10  Current functional level related to goals / functional outcomes: Pt's goals as they remained on her last therapy visit in August 2017 were as follows:  SLP Short Term Goals - 03/04/16 1410              SLP SHORT TERM GOAL #1    Title Pt will demonstrate sustained /a/ of 85dB with rare min A over 3 sessions    Baseline two sessions 02-26-16    Time 1    Period Weeks    Status Not Met         SLP SHORT TERM GOAL #2    Title Pt will maintain average of 72dB during structured speech tasks with occassional min A over 3 sessions     Time 1    Period Weeks    Status Not Met         SLP SHORT TERM GOAL #3    Title --    Status --                       SLP Long Term Goals - 03/13/16 1652              SLP LONG TERM GOAL #1    Title Maintain an average of 70dB during conversation over 12 minutes with rare min A over two sessions     Baseline one session 03-11-16    Time 3    Period Weeks    Status On-going         SLP LONG TERM GOAL #2    Title Philis Nettle audilbly in a noisy environment for 10 minutes with rare min A (01/08/15)    Time 3    Period Weeks    Status On-going      Remaining deficits: Pt was variable in her success from day to day in speech loudness.   Education / Equipment: Need for consistent loud /a/, need for focus on louder speech.  Plan: Patient agrees to discharge.  Patient goals were not met. Patient is being discharged due to not returning since the last visit.  ?????       [place] ,MS, CCC-SLP  09/14/2016, 5:10 PM  [facility] [address] [place], [name], [address] Phone: [phone]   Fax:  [phone]

## 2016-09-14 NOTE — Therapy (Signed)
[facility] [address] [place], [name], [address] Phone: [phone]   Fax:  [phone]  Physical Therapy Treatment  Patient Details  Name: [name] MRN: [id number] Date of Birth: 06/29/1928 Referring Provider: Alonza Bogus  Encounter Date: 09/14/2016      PT End of Session - 09/14/16 1258    Visit Number 3   Number of Visits 18   Date for PT Re-Evaluation 11/07/16   Authorization Type Medicare primary, UHC secondary-GCODE every 10th visit   PT Start Time 1148   PT Stop Time 1235   PT Time Calculation (min) 47 min   Equipment Utilized During Treatment Gait belt   Activity Tolerance Patient tolerated treatment well   Behavior During Therapy WFL for tasks assessed/performed      Past Medical History:  Diagnosis Date  . Anxiety   . Asthma   . Depression   . Diabetes mellitus without complication ([place])    [id number].Marland KitchenMarland Kitchenpt denies  . GERD (gastroesophageal reflux disease)   . Hearing loss   . NHL (non-Hodgkin's lymphoma) ([place])    nhl dx 9/04 breast ca dx1/12  . Parkinson disease [facility])     Past Surgical History:  Procedure Laterality Date  . Ba-HA Ear implant    . MASTECTOMY  2 /8/ 12   bilateral  . MASTECTOMY      Vitals:   09/14/16 1156 09/14/16 1200 09/14/16 1233  BP: (!) 160/72 (!) 127/58 124/70  Pulse: 79 81 77        Subjective Assessment - 09/14/16 1156    Subjective Always has pain in her back when up and about. Relieved by seated rest. Reported episode of wooziness earlier this morning. Denied dizziness/wooziness throughout session (despite decreased BP from sit to stand).    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   Currently in Pain? Yes   Pain Score 5    Pain Location Back   Pain Orientation Mid;Lower   Pain Descriptors / Indicators Aching   Pain Type Chronic pain   Pain Onset More than a month ago   Pain Frequency Intermittent                         [facility] Adult PT Treatment/Exercise - 09/14/16 0001      Bed Mobility   Bed Mobility Rolling Left;Left Sidelying to Sit;Sit to Sidelying Left   Rolling Left 4: Min assist   Rolling Left Details (indicate cue type and reason) fearful of rolling off side of mat, despite PT close guarding   Left Sidelying to Sit 4: Min assist   Left Sidelying to Sit Details (indicate cue type and reason) poor use of LUE to push up   Sit to Sidelying Left 6: Modified independent (Device/Increase time)     Transfers   Transfers Sit to Stand;Stand to Sit   Sit to Stand 4: Min guard;With upper extremity assist;From bed   Stand to Sit 4: Min guard   Transfer Cueing Cues for proper transfer technique: scoot to edge of chair, widened BOS, forward lean and upright stand, with lateral weightshift upon standing. Cues provided for hand placement.   Comments  Pt with staggering LOB x 3 throughout session; minguard for safety     Ambulation/Gait   Ambulation/Gait Yes   Ambulation/Gait Assistance 4: Min guard   Ambulation Distance (Feet) 150 Feet  60   Assistive device Straight cane  small tripod tip   Gait Pattern Step-through pattern;Decreased arm swing - left;Decreased step length - left;Narrow base of support   Ambulation Surface Level;Indoor   Gait Comments staggering losses of balance to her left x 3 with min assist x 1 to prevent fall     Posture/Postural Control   Posture/Postural Control Postural limitations   Postural Limitations Rounded Shoulders;Forward head   Posture Comments in supine worked on chin tucks with max verbal and tactile cues; then attempted in sitting with poor ability to carry-over      Exercises   Exercises Ankle     Knee/Hip Exercises: Standing   Hip Flexion Both;2 sets;10 reps;Knee bent;Stengthening   Hip Flexion Limitations cues for upright posture and prevent trendelenburg at hip   Hip Abduction Stengthening;Both;2 sets;10 reps;Knee straight   Abduction Limitations cues for uprgiht posture and keeping toes/knees facing forward     Knee/Hip Exercises: Seated   Long Arc Quad Strengthening;Both;2 sets;10 reps   Long Arc Quad Limitations vc for slower movement and extend knee only as far as able without leaning trunk backwards   Marching Strengthening;Both;1 set;10 reps     Knee/Hip Exercises: Supine   Bridges Limitations bridging, feet apart, lifting hips ~2" off mat; vc for slow, controlled movement     Ankle Exercises: Seated   Heel Raises 15 reps   Toe Raise 15 reps                PT Education - 09/14/16 1256    Education provided Yes   Education Details Educated on HEP from 2/16; added stnding hip abduction wiht bil UE support   Person(s) Educated Patient;Spouse   Methods Explanation;Demonstration;Tactile cues;Verbal cues;Handout   Comprehension Verbalized understanding;Need further instruction          PT Short Term Goals - 09/08/16 1413      PT SHORT TERM GOAL #1   Title Pt will perform HEP with family supervision, for improved balance, transfers, gait.  TARGET 10/07/16   Time 4   Period Weeks   Status New     PT SHORT TERM GOAL #2   Title Pt will improve 5x sit<>stand to less than or equal to 20 seconds for improved transfer efficiency and safety.   Time 4   Period Weeks   Status New     PT SHORT TERM GOAL #3   Title Berg Balance test to be assessed, with pt to improve by at least 5 points, for decreased fall risk.   Time 4   Period Weeks   Status New     PT SHORT TERM GOAL #4   Title Pt will improve TUG score to less than or equal to 18 seconds for decreased fall risk.   Time 4   Period Weeks   Status New           PT  Long Term Goals - 09/08/16 1416      PT LONG TERM GOAL #1   Title Pt/family will verbalize understanding of fall prevention in home environment.  TARGET 11/07/16   Time 9   Period Weeks   Status New     PT LONG TERM GOAL #2   Title Pt will improve 5x sit<>stand to less than or  equal to 15 seconds for improved ease and efficiency with transfers.   Time 9   Period Weeks   Status New     PT LONG TERM GOAL #3   Title Pt will improve TUG score to less than or equal to 13.5 seconds for decreased fall risk.   Time 9   Period Weeks   Status New     PT LONG TERM GOAL #4   Title Pt will improve gait velocity ot at least 1.8 ft/sec for improved gait efficiency and safety.     Time 9   Period Weeks   Status New               Plan - 09/14/16 1259    Clinical Impression Statement Skilled session focused on LE strengthening and balance. Patient required one supine rest period due to back pain. In supine worked on posture (no pillow under her head and chin tucks) and core/hip strengthening. Patient with 3 losses of balance in standing/walking with cane (all to posterior-left). Patient asking how much longer she needs to use her cane and with 3rd LOB, she noted, "I guess that answers my question about the cane." Husband present throughout the session for education as pt with memory deficits.    Rehab Potential Good   PT Frequency 2x / week  1x/1wk, then    PT Duration 8 weeks  plus eval   PT Treatment/Interventions ADLs/Self Care Home Management;Functional mobility training;Gait training;DME Instruction;Therapeutic activities;Therapeutic exercise;Balance training;Neuromuscular re-education;Patient/family education   PT Next Visit Plan Monitor BP for orthostasis; Review standing hip abduction (?add theraband-yellow);  progress with OTAGO and gait as able.     PT Home Exercise Plan seated: LAQ, marching, heel/toe raises; standing: hip abdct   Consulted and Agree with Plan of Care  Patient;Family member/caregiver   Family Member Consulted husband--[name]      Patient will benefit from skilled therapeutic intervention in order to improve the following deficits and impairments:  Abnormal gait, Decreased balance, Decreased mobility, Decreased strength, Difficulty walking, Postural dysfunction, Decreased endurance  Visit Diagnosis: Muscle weakness (generalized)  Unsteadiness on feet  Other abnormalities of gait and mobility  Abnormal posture  Chronic midline low back pain without sciatica     Problem List Patient Active Problem List   Diagnosis Date Noted  . CAP (community acquired pneumonia) 04/13/2016  . PNA (pneumonia) 04/13/2016  . Murmur 09/02/2015  . Parkinson disease ([place]) 09/02/2015  . Bilateral carotid bruits 09/02/2015  . Confusion 08/07/2015  . Acute encephalopathy 08/07/2015  . UTI (lower urinary tract infection) 08/07/2015  . Acute kidney failure ([place]) 08/07/2015  . Paralysis agitans ([place]) 09/19/2012  . Lymphoma ([place]) 07/03/2011  . Malignant neoplasm of female breast ([place]) 07/03/2011  . Abnormal CT of the chest 03/13/2011  . HYPERTENSION, BENIGN 01/01/2009  . EDEMA 01/01/2009    Rexanne Mano, PT 09/14/2016, 1:08 PM  [facility] [address] [place], [name], [address] Phone: [phone]   Fax:  [phone]  Name: [name] MRN: [id number] Date of Birth: 1927/08/20

## 2016-09-14 NOTE — Patient Instructions (Signed)
ABDUCTION: Standing (Active)    Stand, feet flat, facing your counter with both hands lightly on counter. Lift right leg out to side. Return and lift left leg out to the side.  Complete _2__ sets of _20__ repetitions.  http://gtsc.exer.us/111   Copyright  VHI. All rights reserved.

## 2016-09-15 ENCOUNTER — Encounter: Payer: Self-pay | Admitting: Cardiology

## 2016-09-15 ENCOUNTER — Ambulatory Visit (INDEPENDENT_AMBULATORY_CARE_PROVIDER_SITE_OTHER): Payer: Medicare Other | Admitting: Cardiology

## 2016-09-15 VITALS — BP 140/80 | HR 86 | Ht 63.0 in | Wt 126.2 lb

## 2016-09-15 DIAGNOSIS — I350 Nonrheumatic aortic (valve) stenosis: Secondary | ICD-10-CM

## 2016-09-15 DIAGNOSIS — G20 Parkinson's disease: Secondary | ICD-10-CM | POA: Diagnosis not present

## 2016-09-15 NOTE — Progress Notes (Signed)
Cardiology Office Note    Date:  09/15/2016   ID:  [name], DOB [birth year], MRN [id number]  PCP:  [name] Frees, MD  Cardiologist:   [name], MD     History of Present Illness:  [name] is a 83 y.o. female here for Follow-up of aortic stenosis. She's not having any shortness of breath, no chest pain, no unexplained syncope. No palpitations. She has been seeing Dr. Carles Mathews for years for Parkinson's.  She was hospitalized in September 2017 with community-acquired pneumonia.  She has also had some orthostatic hypotension for instance on 09/11/16 supine 185/89, 1 minute after standing 113/52 with wooziness, 3 minutes 128/65. She is hydrating. Dr. Luna Glasgow the possibility of compression binder.   Another 126/67 sitting, standing 87/47  Her husband also is a patient of mine. Prior bypass surgery.  Had recent admit to [facility] hospital. UTI, confusion. Lovely grandchild.    Past Medical History:  Diagnosis Date  . Anxiety   . Asthma   . Depression   . Diabetes mellitus without complication ([name])    [id number].[name] KitchenMarland Kitchenpt denies  . GERD (gastroesophageal reflux disease)   . Hearing loss   . NHL (non-Hodgkin's lymphoma) ([name])    nhl dx 9/04 breast ca dx1/12  . Parkinson disease [facility])     Past Surgical History:  Procedure Laterality Date  . Ba-HA Ear implant    . MASTECTOMY  2 /8/ 12   bilateral  . MASTECTOMY      Outpatient Medications Prior to Visit  Medication Sig Dispense Refill  . acetaminophen (TYLENOL) 325 MG tablet Take 650 mg by mouth every 6 (six) hours as needed for moderate pain.     [name] acyclovir (ZOVIRAX) 400 MG tablet TAKE 1 TABLET BY MOUTH TWICE DAILY 180 tablet 3  . bisacodyl (DULCOLAX) 5 MG EC tablet Take 5 mg by mouth daily as needed for moderate constipation.    . Calcium Carbonate-Vitamin D3 (CALCIUM 600-D) 600-400 MG-UNIT TABS Take 1 tablet by mouth at bedtime.    . carbidopa-levodopa (SINEMET IR) 25-100 MG tablet 1 tablet in the morning by  mouth, 1 in the afternoon by mouth, 2 in the evening by mouth. 360 tablet 1  . cetirizine (ZYRTEC) 10 MG tablet Take 10 mg by mouth daily at 12 noon.     . cholecalciferol (VITAMIN D) 1000 units tablet Take 1,000 Units by mouth every evening.     . entacapone (COMTAN) 200 MG tablet TAKE 1 TABLET (200 MG TOTAL) BY MOUTH 3 (THREE) TIMES DAILY. (Patient taking differently: TAKE 1 TABLET (200 MG TOTAL) BY MOUTH 3 (THREE) TIMES DAILY - take with Sinemet) 270 tablet 3  . Fluticasone-Salmeterol (ADVAIR) 250-50 MCG/DOSE AEPB Inhale 1 puff into the lungs 2 (two) times daily.    . meloxicam (MOBIC) 15 MG tablet Take 15 mg by mouth daily.    . mirabegron ER (MYRBETRIQ) 50 MG TB24 tablet Take 50 mg by mouth daily at 12 noon. Reported on 01/30/2016    . Multiple Vitamins-Minerals (MULTIVITAMIN WITH MINERALS) tablet Take 1 tablet by mouth daily with breakfast.     . sertraline (ZOLOFT) 25 MG tablet Take one tablet daily for one week, then take two tablets once daily (Patient taking differently: Take 25 mg by mouth at bedtime. ) 60 tablet 5  . methocarbamol (ROBAXIN) 500 MG tablet Take 500 mg by mouth 4 (four) times daily.     No facility-administered medications prior to visit.      Allergies:   Patient  has no known allergies.   Social History   Social History  . Marital status: Married    Spouse name: N/A  . Number of children: N/A  . Years of education: N/A   Occupational History  . retired Retired    Pharmacist, hospital   Social History Main Topics  . Smoking status: Never Smoker  . Smokeless tobacco: Never Used     Comment: husband wsa smoker  . Alcohol use No     Comment: no  . Drug use: No  . Sexual activity: Not Asked   Other Topics Concern  . None   Social History Narrative  . None     Family History:  The patient's family history includes Cancer in her father and mother; Lung cancer in her father.   ROS:   Please see the history of present illness.    ROS  no recent fevers, chills,  orthopnea, PND, no strokelike symptoms All other systems reviewed and are negative.   PHYSICAL EXAM:   VS:  BP 140/80   Pulse 86   Ht 5\' 3"  (1.6 m)   Wt 126 lb 3.2 oz (57.2 kg)   LMP  (LMP Unknown)   BMI 22.36 kg/m    GEN: Well nourished, well developed, in no acute distress  HEENT: normal  Neck: no JVD, positive bilateral carotid bruits, no masses Cardiac: RRR; 2/6 systolic right upper sternal border murmur, no rubs, or gallops,no edema  Respiratory:  clear to auscultation bilaterally, normal work of breathing GI: soft, nontender, nondistended, + BS MS: no deformity or atrophy  Skin: warm and dry, no rash Neuro:  Alert and Oriented x 3, Strength and sensation are intact Psych: euthymic mood, full affect  Wt Readings from Last 3 Encounters:  09/15/16 126 lb 3.2 oz (57.2 kg)  08/19/16 133 lb (60.3 kg)  08/06/16 133 lb 11.2 oz (60.6 kg)      Studies/Labs Reviewed:   EKG:  None today  Recent Labs: 08/06/2016: ALT 9; BUN 15.7; Creatinine 1.1; HGB 10.8; Platelets 215; Potassium 4.5; Sodium 143   Lipid Panel No results found for: CHOL, TRIG, HDL, CHOLHDL, VLDL, LDLCALC, LDLDIRECT  Additional studies/ records that were reviewed today include:  Prior hospital records, office notes reviewed, lab work reviewed  Echocardiogram 09/12/15: - Compared to a prior echo in 2009, there is now moderate aortic   stenosis with AVA around 1.1-1.2 cm2. The LVEF is higher at   65-70%.   ASSESSMENT:    1. Nonrheumatic aortic valve stenosis   2. Parkinson disease ([place])      PLAN:  In order of problems listed above:  1. Aortic stenosis. Moderate in severity in February 2017. She has been having worsening orthostatic hypotension-we will repeat echocardiogram.  2. Parkinson's disease-monitored by Dr. Carles Mathews. Watched her walk into clinic, using cane, short shuffling gait. 3. Carotid bruit- radiation of aortic valve murmur. 4. Orthostatic hypotension - likely constellation of Parkinson's  dysautonomia with contribution from her aortic valve disease and generalized aging. Salt liberalization, fluid increase, support stockings have been recommended. Could consider abdominal binder in the future as well, Dr. Carles Mathews mentioned in her note. Pharmacologically, she is on no antihypertensives which is good. Could consider Midodrine if necessary in the future.  Two-month follow-up  Medication Adjustments/Labs and Tests Ordered: Current medicines are reviewed at length with the patient today.  Concerns regarding medicines are outlined above.  Medication changes, Labs and Tests ordered today are listed in the Patient Instructions below. Patient Instructions  Medication Instructions:  Please continue medications as listed. May need to consider Midodrine in the future.  Please wear knee high compression stocking daily (20-30 mm/hg).  You may remove them at bedtime. Please increase your fluid and sodium intake. Take care with changing positions especially in the early morning hours.  Testing/Procedures: Your physician has requested that you have an echocardiogram. Echocardiography is a painless test that uses sound waves to create images of your heart. It provides your doctor with information about the size and shape of your heart and how well your heart's chambers and valves are working. This procedure takes approximately one hour. There are no restrictions for this procedure.  Follow-Up: Follow up in 2 months with Dr Marlou Porch.  If you need a refill on your cardiac medications before your next appointment, please call your pharmacy.  Thank you for choosing [facility]!!          Signed, [name], MD  09/15/2016 11:09 AM    [facility] [place], [place], [place]  [address] Phone: [phone]; Fax: [phone]

## 2016-09-15 NOTE — Patient Instructions (Addendum)
Medication Instructions:  Please continue medications as listed. May need to consider Midodrine in the future.  Please wear knee high compression stocking daily (20-30 mm/hg).  You may remove them at bedtime. Please increase your fluid and sodium intake. Take care with changing positions especially in the early morning hours.  Testing/Procedures: Your physician has requested that you have an echocardiogram. Echocardiography is a painless test that uses sound waves to create images of your heart. It provides your doctor with information about the size and shape of your heart and how well your heart's chambers and valves are working. This procedure takes approximately one hour. There are no restrictions for this procedure.  Follow-Up: Follow up in 2 months with Dr Marlou Porch.  If you need a refill on your cardiac medications before your next appointment, please call your pharmacy.  Thank you for choosing [facility]!!

## 2016-09-16 ENCOUNTER — Ambulatory Visit: Payer: Medicare Other | Admitting: Physical Therapy

## 2016-09-16 VITALS — BP 114/60

## 2016-09-16 DIAGNOSIS — R2689 Other abnormalities of gait and mobility: Secondary | ICD-10-CM | POA: Diagnosis not present

## 2016-09-16 DIAGNOSIS — M6281 Muscle weakness (generalized): Secondary | ICD-10-CM | POA: Diagnosis not present

## 2016-09-16 DIAGNOSIS — R2681 Unsteadiness on feet: Secondary | ICD-10-CM

## 2016-09-16 DIAGNOSIS — R293 Abnormal posture: Secondary | ICD-10-CM | POA: Diagnosis not present

## 2016-09-16 DIAGNOSIS — G8929 Other chronic pain: Secondary | ICD-10-CM | POA: Diagnosis not present

## 2016-09-16 DIAGNOSIS — M545 Low back pain: Secondary | ICD-10-CM | POA: Diagnosis not present

## 2016-09-16 NOTE — Therapy (Signed)
[facility] [address] [place], [name], [address] Phone: [phone]   Fax:  [phone]  Physical Therapy Treatment  Patient Details  Name: [name] MRN: [id number] Date of Birth: 05-12-1928 Referring Provider: Alonza Bogus  Encounter Date: 09/16/2016      PT End of Session - 09/16/16 2042    Visit Number 4   Number of Visits 18   Date for PT Re-Evaluation 11/07/16   Authorization Type Medicare primary, UHC secondary-GCODE every 10th visit   PT Start Time 1108  Pt arrives late   PT Stop Time 1147   PT Time Calculation (min) 39 min   Activity Tolerance Patient tolerated treatment well   Behavior During Therapy [facility] for tasks assessed/performed      Past Medical History:  Diagnosis Date  . Anxiety   . Asthma   . Depression   . Diabetes mellitus without complication ([place])    [id number].Marland KitchenMarland Kitchenpt denies  . GERD (gastroesophageal reflux disease)   . Hearing loss   . NHL (non-Hodgkin's lymphoma) ([place])    nhl dx 9/04 breast ca dx1/12  . Parkinson disease [facility])     Past Surgical History:  Procedure Laterality Date  . Ba-HA Ear implant    . MASTECTOMY  2 /8/ 12   bilateral  . MASTECTOMY      Vitals:   09/16/16 1111  BP: 114/60        Subjective Assessment - 09/16/16 1111    Subjective Just not feeling the greatest today.  Started when I woke up this morning.  Cardiologist recommends increase water intake and salt intake to address blood pressure.   Patient is accompained by: Family member  Husband   Pertinent History Lumbar radiofrequency ablation (to treat lumbar facet joint pain)-last one 08/27/16 with relief of back pain; dementia due to Parkinson's; DVT (07/2016 with aspirin x 10 days treatment)   Patient Stated Goals Pt's goals for therapy are to improve balance and get away from cane.   Pain Score 5    Pain Location Back   Pain Orientation Mid;Lower   Pain Descriptors / Indicators Aching   Pain Type Chronic pain   Pain Onset More than a month ago   Pain Frequency Constant   Aggravating Factors  standing too long   Pain Relieving Factors sitting and resting      Self Care:  Monitored blood pressure prior to standing activities with: -seated BP as noted above -standing BP 90/56 HR 81 bpm upon initial standing (initial c/o wooziness) -standing BP 103/54 HR 80 bpm after 2 minutes of standing (wooziness subsided and pt able to participate in standing exercises with seated rest breaks)  Discussed cardiologist visit and reviewed his recommendation to increase water and salt intake -Discussed need for slowed transitions and husband's supervision with changes of position and need to stand for several minutes prior to initiating gait                   [facility] Adult PT Treatment/Exercise - 09/16/16 0001      Exercises   Exercises Other Exercises   Other Exercises  See below, as review of HEP given previous visits.  Pt return demo understanding with picture cues, verbal cues.     Knee/Hip Exercises: Standing   Hip Abduction Stengthening;Both;2 sets;10 reps;Knee straight   Abduction Limitations cues for uprgiht posture and keeping toes/knees facing forward     Knee/Hip Exercises: Seated   Long Arc Quad Strengthening;Both;2 sets;10 reps  Long [employer] Limitations verbal cues for slowed pace with [employer] Strengthening;Both;10 reps;2 sets     Ankle Exercises: Seated   Heel Raises 15 reps   Toe Raise 15 reps             Balance Exercises - 09/16/16 2039      OTAGO PROGRAM   Head Movements Sitting;5 reps   Neck Movements Sitting;5 reps   Back Extension Standing;5 reps  UEs at chair   Trunk Movements Standing;5 reps   Ankle Movements Sitting;10 reps   Knee Extensor 10 reps   Knee Flexor 10 reps  UE supported at chair for standing activities   Hip ABductor 10 reps   Ankle Plantorflexors --  x 10 reps support at chair   Ankle Dorsiflexors --  x  10 reps UE support at chair           PT Education - 09/16/16 2041    Education provided Yes   Education Details Provided patient with OTAGO HEP packet; additions to HEP through standing heel and toe raises   Person(s) Educated Patient;Spouse   Methods Explanation;Demonstration;Handout   Comprehension Verbalized understanding;Returned demonstration;Need further instruction          PT Short Term Goals - 09/08/16 1413      PT SHORT TERM GOAL #1   Title Pt will perform HEP with family supervision, for improved balance, transfers, gait.  TARGET 10/07/16   Time 4   Period Weeks   Status New     PT SHORT TERM GOAL #2   Title Pt will improve 5x sit<>stand to less than or equal to 20 seconds for improved transfer efficiency and safety.   Time 4   Period Weeks   Status New     PT SHORT TERM GOAL #3   Title Berg Balance test to be assessed, with pt to improve by at least 5 points, for decreased fall risk.   Time 4   Period Weeks   Status New     PT SHORT TERM GOAL #4   Title Pt will improve TUG score to less than or equal to 18 seconds for decreased fall risk.   Time 4   Period Weeks   Status New           PT Long Term Goals - 09/08/16 1416      PT LONG TERM GOAL #1   Title Pt/family will verbalize understanding of fall prevention in home environment.  TARGET 11/07/16   Time 9   Period Weeks   Status New     PT LONG TERM GOAL #2   Title Pt will improve 5x sit<>stand to less than or equal to 15 seconds for improved ease and efficiency with transfers.   Time 9   Period Weeks   Status New     PT LONG TERM GOAL #3   Title Pt will improve TUG score to less than or equal to 13.5 seconds for decreased fall risk.   Time 9   Period Weeks   Status New     PT LONG TERM GOAL #4   Title Pt will improve gait velocity ot at least 1.8 ft/sec for improved gait efficiency and safety.     Time 9   Period Weeks   Status New               Plan - 09/16/16 2043     Clinical Impression Statement Skilled PT session focused today on lower  extremity strengthening as part of OTAGO HEP.  Pt continues to have drop in blood pressure in standing and requires several seated rest breaks during session.  Provided strengthening portion of HEP to patient, with husband observing PT session today.   Rehab Potential Good   PT Frequency 2x / week  1x/1wk, then    PT Duration 8 weeks  plus eval   PT Treatment/Interventions ADLs/Self Care Home Management;Functional mobility training;Gait training;DME Instruction;Therapeutic activities;Therapeutic exercise;Balance training;Neuromuscular re-education;Patient/family education   PT Next Visit Plan Monitor BP for orthostasis; Perform Berg and update goal.  Review OTAGO and progress gait/walking program as able.     PT Home Exercise Plan OTAGO strengthening exercises added 09/16/16   Consulted and Agree with Plan of Care Patient;Family member/caregiver   Family Member Consulted husband--[name]      Patient will benefit from skilled therapeutic intervention in order to improve the following deficits and impairments:  Abnormal gait, Decreased balance, Decreased mobility, Decreased strength, Difficulty walking, Postural dysfunction, Decreased endurance  Visit Diagnosis: Muscle weakness (generalized)  Unsteadiness on feet     Problem List Patient Active Problem List   Diagnosis Date Noted  . CAP (community acquired pneumonia) 04/13/2016  . PNA (pneumonia) 04/13/2016  . Murmur 09/02/2015  . Parkinson disease ([place]) 09/02/2015  . Bilateral carotid bruits 09/02/2015  . Confusion 08/07/2015  . Acute encephalopathy 08/07/2015  . UTI (lower urinary tract infection) 08/07/2015  . Acute kidney failure ([place]) 08/07/2015  . Paralysis agitans ([place]) 09/19/2012  . Lymphoma ([place]) 07/03/2011  . Malignant neoplasm of female breast ([place]) 07/03/2011  . Abnormal CT of the chest 03/13/2011  . HYPERTENSION, BENIGN 01/01/2009  . EDEMA  01/01/2009    Felice Deem W. 09/16/2016, 8:53 PM Frazier Butt., PT [facility] [address] [place], [name], [address] Phone: [phone]   Fax:  [phone]  Name: Vitina Rad MRN: [id number] Date of Birth: 1928-02-14

## 2016-09-23 ENCOUNTER — Ambulatory Visit: Payer: Medicare Other | Admitting: Physical Therapy

## 2016-09-25 ENCOUNTER — Ambulatory Visit: Payer: Medicare Other | Attending: Neurology | Admitting: Physical Therapy

## 2016-09-25 DIAGNOSIS — M6281 Muscle weakness (generalized): Secondary | ICD-10-CM | POA: Insufficient documentation

## 2016-09-25 DIAGNOSIS — R41841 Cognitive communication deficit: Secondary | ICD-10-CM | POA: Diagnosis not present

## 2016-09-25 DIAGNOSIS — R293 Abnormal posture: Secondary | ICD-10-CM | POA: Diagnosis not present

## 2016-09-25 DIAGNOSIS — R2681 Unsteadiness on feet: Secondary | ICD-10-CM | POA: Insufficient documentation

## 2016-09-25 DIAGNOSIS — R2689 Other abnormalities of gait and mobility: Secondary | ICD-10-CM | POA: Insufficient documentation

## 2016-09-25 DIAGNOSIS — R29818 Other symptoms and signs involving the nervous system: Secondary | ICD-10-CM | POA: Insufficient documentation

## 2016-09-25 DIAGNOSIS — R278 Other lack of coordination: Secondary | ICD-10-CM | POA: Diagnosis not present

## 2016-09-25 DIAGNOSIS — R471 Dysarthria and anarthria: Secondary | ICD-10-CM | POA: Diagnosis not present

## 2016-09-25 DIAGNOSIS — R41844 Frontal lobe and executive function deficit: Secondary | ICD-10-CM | POA: Insufficient documentation

## 2016-09-25 NOTE — Therapy (Signed)
[facility] [address] [place], [name], [address] Phone: [phone]   Fax:  [phone]  Physical Therapy Treatment  Patient Details  Name: [name] MRN: [id number] Date of Birth: 1928/02/14 Referring Provider: Alonza Bogus  Encounter Date: 09/25/2016      PT End of Session - 09/25/16 1431    Visit Number 5   Number of Visits 18   Date for PT Re-Evaluation 11/07/16   Authorization Type Medicare primary, UHC secondary-GCODE every 10th visit   PT Start Time 1107  Pt arrives late   PT Stop Time 1145   PT Time Calculation (min) 38 min   Activity Tolerance Patient limited by fatigue   Behavior During Therapy [facility] for tasks assessed/performed      Past Medical History:  Diagnosis Date  . Anxiety   . Asthma   . Depression   . Diabetes mellitus without complication ([place])    [id number].Marland KitchenMarland Kitchenpt denies  . GERD (gastroesophageal reflux disease)   . Hearing loss   . NHL (non-Hodgkin's lymphoma) ([place])    nhl dx 9/04 breast ca dx1/12  . Parkinson disease [facility])     Past Surgical History:  Procedure Laterality Date  . Ba-HA Ear implant    . MASTECTOMY  2 /8/ 12   bilateral  . MASTECTOMY      There were no vitals filed for this visit.      Subjective Assessment - 09/25/16 1112    Subjective Have been not feeling great, because my back is bothering me more.   Patient is accompained by: Family member  Husband   Pertinent History Lumbar radiofrequency ablation (to treat lumbar facet joint pain)-last one 08/27/16 with relief of back pain; dementia due to Parkinson's; DVT (07/2016 with aspirin x 10 days treatment)   Patient Stated Goals Pt's goals for therapy are to improve balance and get away from cane.   Currently in Pain? Yes   Pain Score 7    Pain Location Back   Pain Orientation Mid;Lower   Pain Descriptors / Indicators Aching   Pain Type Chronic pain   Pain Onset More than a month ago   Aggravating  Factors  hurts when I get up, make the bed   Pain Relieving Factors heating pad, took medication this morning                         [facility] Adult PT Treatment/Exercise - 09/25/16 1114      Standardized Balance Assessment   Standardized Balance Assessment Berg Balance Test     Berg Balance Test   Sit to Stand Able to stand  independently using hands   Standing Unsupported Able to stand 2 minutes with supervision   Sitting with Back Unsupported but Feet Supported on Floor or Stool Able to sit safely and securely 2 minutes   Stand to Sit Controls descent by using hands   Transfers Able to transfer safely, definite need of hands   Standing Unsupported with Eyes Closed Able to stand 10 seconds with supervision   Standing Ubsupported with Feet Together Able to place feet together independently and stand for 1 minute with supervision   From Standing, Reach Forward with Outstretched Arm Can reach confidently >25 cm (10")   From Standing Position, Pick up Object from Floor Able to pick up shoe, needs supervision   From Standing Position, Turn to Look Behind Over each Shoulder Looks behind from both sides and weight shifts  well   Turn 360 Degrees Able to turn 360 degrees safely but slowly   Standing Unsupported, Alternately Place Feet on Step/Stool Able to complete >2 steps/needs minimal assist   Standing Unsupported, One Foot in Front Able to take small step independently and hold 30 seconds   Standing on One Leg Tries to lift leg/unable to hold 3 seconds but remains standing independently   Total Score 39     High Level Balance   High Level Balance Comments Discussed Berg Balance score of 39/56 indicating increased fall risk.             Balance Exercises - 09/25/16 1127      OTAGO PROGRAM   Head Movements Sitting;5 reps   Neck Movements Sitting;5 reps   Back Extension Standing;5 reps  UEs at counter   Trunk Movements Standing;5 reps   Ankle Movements Sitting;10  reps   Knee Extensor 10 reps   Knee Flexor 10 reps   Hip ABductor 10 reps   Knee Bends 10 reps, support   Backwards Walking Support  forward/back walking at counter   Sideways Walking Assistive device   Tandem Stance 10 seconds, support   Overall OTAGO Comments Added Knee bends>tandem stance to HEP; pt requires multiple rest breaks during review of initial OTAGO and additions to program today.              PT Education - 09/25/16 1431    Education provided Yes   Education Details Additions to HEP packet-knee bend to tandem stance   Person(s) Educated Patient   Methods Explanation;Demonstration;Handout   Comprehension Verbalized understanding;Returned demonstration;Need further instruction          PT Short Term Goals - 09/25/16 1436      PT SHORT TERM GOAL #1   Title Pt will perform HEP with family supervision, for improved balance, transfers, gait.  TARGET 10/07/16   Time 4   Period Weeks   Status New     PT SHORT TERM GOAL #2   Title Pt will improve 5x sit<>stand to less than or equal to 20 seconds for improved transfer efficiency and safety.   Time 4   Period Weeks   Status New     PT SHORT TERM GOAL #3   Title Berg Balance test to be assessed, with pt to improve by at least 5 points, for decreased fall risk.   Baseline Berg 39/56 on 09/25/16, with goal:  44/56   Time 4   Period Weeks   Status New     PT SHORT TERM GOAL #4   Title Pt will improve TUG score to less than or equal to 18 seconds for decreased fall risk.   Time 4   Period Weeks   Status New           PT Long Term Goals - 09/08/16 1416      PT LONG TERM GOAL #1   Title Pt/family will verbalize understanding of fall prevention in home environment.  TARGET 11/07/16   Time 9   Period Weeks   Status New     PT LONG TERM GOAL #2   Title Pt will improve 5x sit<>stand to less than or equal to 15 seconds for improved ease and efficiency with transfers.   Time 9   Period Weeks   Status New      PT LONG TERM GOAL #3   Title Pt will improve TUG score to less than or equal to 13.5 seconds for  decreased fall risk.   Time 9   Period Weeks   Status New     PT LONG TERM GOAL #4   Title Pt will improve gait velocity ot at least 1.8 ft/sec for improved gait efficiency and safety.     Time 9   Period Weeks   Status New               Plan - 09/25/16 1432    Clinical Impression Statement Pt missed first visit this week due to therapist being out unexpectedly with sick child.  Berg Balance test completed this visit-39/56, indicating increased fall risk.  Education provided for patient to continue using cane for gait safety. Reviewed HEP and added several exercises to OTAGO program.  Pt reports how fatigued she is with performing HEP.  Pt will continue to benefit from skilled therapy to address balance and gait, strength deficits.   Rehab Potential Good   PT Frequency 2x / week  1x/1wk, then    PT Duration 8 weeks  plus eval   PT Treatment/Interventions ADLs/Self Care Home Management;Functional mobility training;Gait training;DME Instruction;Therapeutic activities;Therapeutic exercise;Balance training;Neuromuscular re-education;Patient/family education   PT Next Visit Plan Monitor BP for orthostasis; Review OTAGO and progress gait/walking program as able.     PT Home Exercise Plan OTAGO strengthening exercises added 09/16/16, 09/25/16   Consulted and Agree with Plan of Care Patient;Family member/caregiver   Family Member Consulted husband--[name]      Patient will benefit from skilled therapeutic intervention in order to improve the following deficits and impairments:  Abnormal gait, Decreased balance, Decreased mobility, Decreased strength, Difficulty walking, Postural dysfunction, Decreased endurance  Visit Diagnosis: Unsteadiness on feet  Abnormal posture     Problem List Patient Active Problem List   Diagnosis Date Noted  . CAP (community acquired pneumonia) 04/13/2016  .  PNA (pneumonia) 04/13/2016  . Murmur 09/02/2015  . Parkinson disease ([place]) 09/02/2015  . Bilateral carotid bruits 09/02/2015  . Confusion 08/07/2015  . Acute encephalopathy 08/07/2015  . UTI (lower urinary tract infection) 08/07/2015  . Acute kidney failure ([place]) 08/07/2015  . Paralysis agitans ([place]) 09/19/2012  . Lymphoma ([place]) 07/03/2011  . Malignant neoplasm of female breast ([place]) 07/03/2011  . Abnormal CT of the chest 03/13/2011  . HYPERTENSION, BENIGN 01/01/2009  . EDEMA 01/01/2009    Dreux Mcgroarty W. 09/25/2016, 2:38 PM  Frazier Butt., PT  [facility] [address] [place], [name], [address] Phone: [phone]   Fax:  [phone]  Name: [name] MRN: [id number] Date of Birth: 1928/05/21

## 2016-09-28 ENCOUNTER — Encounter: Payer: Self-pay | Admitting: Physical Therapy

## 2016-09-28 ENCOUNTER — Ambulatory Visit: Payer: Medicare Other | Admitting: Physical Therapy

## 2016-09-28 DIAGNOSIS — R293 Abnormal posture: Secondary | ICD-10-CM | POA: Diagnosis not present

## 2016-09-28 DIAGNOSIS — R2689 Other abnormalities of gait and mobility: Secondary | ICD-10-CM

## 2016-09-28 DIAGNOSIS — R2681 Unsteadiness on feet: Secondary | ICD-10-CM | POA: Diagnosis not present

## 2016-09-28 DIAGNOSIS — R278 Other lack of coordination: Secondary | ICD-10-CM | POA: Diagnosis not present

## 2016-09-28 DIAGNOSIS — M6281 Muscle weakness (generalized): Secondary | ICD-10-CM | POA: Diagnosis not present

## 2016-09-28 DIAGNOSIS — R29818 Other symptoms and signs involving the nervous system: Secondary | ICD-10-CM | POA: Diagnosis not present

## 2016-09-28 NOTE — Therapy (Signed)
[facility] [address] [place], [name], [address] Phone: [phone]   Fax:  [phone]  Physical Therapy Treatment  Patient Details  Name: [name] MRN: [id number] Date of Birth: 1928/04/13 Referring Provider: Alonza Bogus  Encounter Date: 09/28/2016      PT End of Session - 09/28/16 1217    Visit Number 6   Number of Visits 18   Date for PT Re-Evaluation 11/07/16   Authorization Type Medicare primary, UHC secondary-GCODE every 10th visit   PT Start Time 1105   PT Stop Time 1146   PT Time Calculation (min) 41 min   Equipment Utilized During Treatment Gait belt   Activity Tolerance Patient limited by fatigue   Behavior During Therapy WFL for tasks assessed/performed      Past Medical History:  Diagnosis Date  . Anxiety   . Asthma   . Depression   . Diabetes mellitus without complication ([place])    [id number].Marland KitchenMarland Kitchenpt denies  . GERD (gastroesophageal reflux disease)   . Hearing loss   . NHL (non-Hodgkin's lymphoma) ([place])    nhl dx 9/04 breast ca dx1/12  . Parkinson disease [facility])     Past Surgical History:  Procedure Laterality Date  . Ba-HA Ear implant    . MASTECTOMY  2 /8/ 12   bilateral  . MASTECTOMY      There were no vitals filed for this visit.      Subjective Assessment - 09/28/16 1106    Subjective Back starts to hurt when I start moving or lifting something (like making the bed). Pt was able to go to church this weekend and sees that as an improvement in mobility; used the cane.   Patient is accompained by: Family member  Husband   Pertinent History Lumbar radiofrequency ablation (to treat lumbar facet joint pain)-last one 08/27/16 with relief of back pain; dementia due to Parkinson's; DVT (07/2016 with aspirin x 10 days treatment)   Patient Stated Goals Pt's goals for therapy are to improve balance and get away from cane.   Currently in Pain? Yes   Pain Score 4    Pain Location Back    Pain Orientation Mid;Lower   Pain Descriptors / Indicators Aching   Pain Type Chronic pain   Pain Onset More than a month ago   Pain Frequency Constant                              Balance Exercises - 09/28/16 1111      OTAGO PROGRAM   Head Movements Sitting;5 reps   Neck Movements Sitting;5 reps   Back Extension Standing;5 reps   Trunk Movements Standing;5 reps   Ankle Movements Sitting;10 reps   Knee Extensor 10 reps   Knee Flexor 10 reps   Hip ABductor 10 reps   Ankle Plantorflexors 20 reps, support  x10 only   Ankle Dorsiflexors 20 reps, support  x10 only   Knee Bends 10 reps, support   Sideways Walking Assistive device   Tandem Stance 10 seconds, support           PT Education - 09/28/16 1127    Education Details Reviewed [place] and additions to HEP from last session .   Person(s) Educated Patient   Methods Explanation;Demonstration;Verbal cues;Handout   Comprehension Verbalized understanding;Returned demonstration;Verbal cues required;Need further instruction          PT Short Term Goals - 09/25/16 1436  PT SHORT TERM GOAL #1   Title Pt will perform HEP with family supervision, for improved balance, transfers, gait.  TARGET 10/07/16   Time 4   Period Weeks   Status New     PT SHORT TERM GOAL #2   Title Pt will improve 5x sit<>stand to less than or equal to 20 seconds for improved transfer efficiency and safety.   Time 4   Period Weeks   Status New     PT SHORT TERM GOAL #3   Title Berg Balance test to be assessed, with pt to improve by at least 5 points, for decreased fall risk.   Baseline Berg 39/56 on 09/25/16, with goal:  44/56   Time 4   Period Weeks   Status New     PT SHORT TERM GOAL #4   Title Pt will improve TUG score to less than or equal to 18 seconds for decreased fall risk.   Time 4   Period Weeks   Status New           PT Long Term Goals - 09/08/16 1416      PT LONG TERM GOAL #1   Title Pt/family  will verbalize understanding of fall prevention in home environment.  TARGET 11/07/16   Time 9   Period Weeks   Status New     PT LONG TERM GOAL #2   Title Pt will improve 5x sit<>stand to less than or equal to 15 seconds for improved ease and efficiency with transfers.   Time 9   Period Weeks   Status New     PT LONG TERM GOAL #3   Title Pt will improve TUG score to less than or equal to 13.5 seconds for decreased fall risk.   Time 9   Period Weeks   Status New     PT LONG TERM GOAL #4   Title Pt will improve gait velocity ot at least 1.8 ft/sec for improved gait efficiency and safety.     Time 9   Period Weeks   Status New               Plan - 09/28/16 1130    Clinical Impression Statement Pt continues to need min cues for technique with HEP but did not need rest breaks during performance of HEP during session.  Pt required increased time to perform HEP allowing for  training for comprehension and problem solving for greater independence and compliance when at home.   Rehab Potential Good   PT Frequency 2x / week  1x/1wk, then    PT Duration 8 weeks  plus eval   PT Treatment/Interventions ADLs/Self Care Home Management;Functional mobility training;Gait training;DME Instruction;Therapeutic activities;Therapeutic exercise;Balance training;Neuromuscular re-education;Patient/family education   PT Next Visit Plan Monitor BP for orthostasis; progress gait/walking program as able; standing balance training.    PT Home Exercise Plan OTAGO strengthening exercises added 09/16/16, 09/25/16   Consulted and Agree with Plan of Care Patient;Family member/caregiver   Family Member Consulted husband--[name]      Patient will benefit from skilled therapeutic intervention in order to improve the following deficits and impairments:  Abnormal gait, Decreased balance, Decreased mobility, Decreased strength, Difficulty walking, Postural dysfunction, Decreased endurance  Visit  Diagnosis: Abnormal posture  Muscle weakness (generalized)  Other abnormalities of gait and mobility     Problem List Patient Active Problem List   Diagnosis Date Noted  . CAP (community acquired pneumonia) 04/13/2016  . PNA (pneumonia) 04/13/2016  . Murmur  09/02/2015  . Parkinson disease ([place]) 09/02/2015  . Bilateral carotid bruits 09/02/2015  . Confusion 08/07/2015  . Acute encephalopathy 08/07/2015  . UTI (lower urinary tract infection) 08/07/2015  . Acute kidney failure ([place]) 08/07/2015  . Paralysis agitans ([place]) 09/19/2012  . Lymphoma ([place]) 07/03/2011  . Malignant neoplasm of female breast ([place]) 07/03/2011  . Abnormal CT of the chest 03/13/2011  . HYPERTENSION, BENIGN 01/01/2009  . EDEMA 01/01/2009   Bjorn Loser, PTA  09/28/16, 12:19 PM [facility] [address] [place], [name], [address] Phone: [phone]   Fax:  [phone]  Name: Modupe Hurtado MRN: [id number] Date of Birth: 06-Oct-1927

## 2016-09-30 DIAGNOSIS — N3946 Mixed incontinence: Secondary | ICD-10-CM | POA: Diagnosis not present

## 2016-10-01 ENCOUNTER — Ambulatory Visit: Payer: Medicare Other | Admitting: Physical Therapy

## 2016-10-01 ENCOUNTER — Encounter: Payer: Self-pay | Admitting: Physical Therapy

## 2016-10-01 DIAGNOSIS — R2681 Unsteadiness on feet: Secondary | ICD-10-CM

## 2016-10-01 DIAGNOSIS — R278 Other lack of coordination: Secondary | ICD-10-CM | POA: Diagnosis not present

## 2016-10-01 DIAGNOSIS — M6281 Muscle weakness (generalized): Secondary | ICD-10-CM | POA: Diagnosis not present

## 2016-10-01 DIAGNOSIS — R293 Abnormal posture: Secondary | ICD-10-CM

## 2016-10-01 DIAGNOSIS — R2689 Other abnormalities of gait and mobility: Secondary | ICD-10-CM | POA: Diagnosis not present

## 2016-10-01 DIAGNOSIS — R29818 Other symptoms and signs involving the nervous system: Secondary | ICD-10-CM | POA: Diagnosis not present

## 2016-10-01 NOTE — Therapy (Signed)
[facility] [address] [place], [name], [address] Phone: [phone]   Fax:  [phone]  Physical Therapy Treatment  Patient Details  Name: [name] MRN: [id number] Date of Birth: 1927-12-21 Referring Provider: Alonza Bogus  Encounter Date: 10/01/2016      PT End of Session - 10/01/16 1232    Visit Number 7   Number of Visits 18   Date for PT Re-Evaluation 11/07/16   Authorization Type Medicare primary, UHC secondary-GCODE every 10th visit   PT Start Time 1105   PT Stop Time 1144   PT Time Calculation (min) 39 min   Equipment Utilized During Treatment Gait belt   Activity Tolerance Patient tolerated treatment well;No increased pain   Behavior During Therapy WFL for tasks assessed/performed      Past Medical History:  Diagnosis Date  . Anxiety   . Asthma   . Depression   . Diabetes mellitus without complication ([place])    2/50/53.Marland KitchenMarland Kitchenpt denies  . GERD (gastroesophageal reflux disease)   . Hearing loss   . NHL (non-Hodgkin's lymphoma) ([place])    nhl dx 9/04 breast ca dx1/12  . Parkinson disease [facility])     Past Surgical History:  Procedure Laterality Date  . Ba-HA Ear implant    . MASTECTOMY  2 /8/ 12   bilateral  . MASTECTOMY      There were no vitals filed for this visit.      Subjective Assessment - 10/01/16 1111    Subjective Patient reporting difficulty with walking on toes and heels (exercises at end of OTAGO booklet). Noted her program is marked to stop at tandem stance and she is not to do the exercises after that. Patient had forgotten that instruction (despite large green sticky note on last exercise "stop here.")   Pertinent History Lumbar radiofrequency ablation (to treat lumbar facet joint pain)-last one 08/27/16 with relief of back pain; dementia due to Parkinson's; DVT (07/2016 with aspirin x 10 days treatment)   Limitations Lifting;Standing;Walking   Patient Stated Goals Pt's goals  for therapy are to improve balance and get away from cane.   Currently in Pain? Yes   Pain Score 4    Pain Location Back   Pain Orientation Mid;Lower   Pain Descriptors / Indicators Nagging   Pain Type Chronic pain   Pain Frequency Constant   Pain Relieving Factors heating pad, take medicine                          [facility] Adult PT Treatment/Exercise - 10/01/16 1148      Transfers   Sit to Stand 5: Supervision;With upper extremity assist;With armrests;From chair/3-in-1  to cane   Sit to Stand Details (indicate cue type and reason) vc for scoot to edge to prevent pushing against surface with posterior legs   Stand to Sit 4: Min guard;With upper extremity assist;With armrests;To chair/3-in-1  with cane   Transfer Cueing Patient reporting incr back pain today, therefore used armrests and cane. Vc for sequencing with stand to sit     Ambulation/Gait   Ambulation/Gait Assistance 4: Min guard   Ambulation Distance (Feet) 110 Feet  80, 50, 50, 100   Assistive device Straight cane   Gait Pattern Step-through pattern;Decreased arm swing - left;Decreased step length - left;Narrow base of support   Ambulation Surface Level;Indoor   Gait Comments very short step length bil with reports of back pain (initially; by end of session  reported no pain)     Posture/Postural Control   Posture/Postural Control Postural limitations   Postural Limitations Rounded Shoulders;Forward head   Posture Comments addressed with standing balance activities             Balance Exercises - 10/01/16 1152      Balance Exercises: Standing   Standing Eyes Opened Narrow base of support (BOS);Wide (BOA);Head turns;Foam/compliant surface;Solid surface;30 secs  shoulder-width, feet together, 30 sec, 10 reps head turns   Standing Eyes Closed Narrow base of support (BOS);Wide (BOA);Solid surface;Foam/compliant surface  able to hold 30 sec solid; only able 10 sec on foam   Tandem Stance Eyes  open;Foam/compliant surface;Upper extremity support 1;3 reps;30 secs  tandem no support unable >2 sec; 1 UE support able 30 sec   Stepping Strategy Anterior;Posterior;Foam/compliant surface;UE support;10 reps  red mat; initial HHA, progressed to no UE support   Partial Tandem Stance Eyes open;Foam/compliant surface;Intermittent upper extremity support;3 reps;10 secs;20 secs   Turning Right;Left;10 reps  using "clock face technique"     OTAGO PROGRAM   Tandem Stance 10 seconds, support           PT Education - 10/01/16 1205    Education provided Yes   Education Details Reviewed patient's questions re: [employer]. Added written instructions to tandem stance as pt very frustrated with inability to do this at home.    Person(s) Educated Patient   Methods Explanation;Demonstration;Handout   Comprehension Verbalized understanding;Returned demonstration;Verbal cues required;Need further instruction          PT Short Term Goals - 09/25/16 1436      PT SHORT TERM GOAL #1   Title Pt will perform HEP with family supervision, for improved balance, transfers, gait.  TARGET 10/07/16   Time 4   Period Weeks   Status New     PT SHORT TERM GOAL #2   Title Pt will improve 5x sit<>stand to less than or equal to 20 seconds for improved transfer efficiency and safety.   Time 4   Period Weeks   Status New     PT SHORT TERM GOAL #3   Title Berg Balance test to be assessed, with pt to improve by at least 5 points, for decreased fall risk.   Baseline Berg 39/56 on 09/25/16, with goal:  44/56   Time 4   Period Weeks   Status New     PT SHORT TERM GOAL #4   Title Pt will improve TUG score to less than or equal to 18 seconds for decreased fall risk.   Time 4   Period Weeks   Status New           PT Long Term Goals - 09/08/16 1416      PT LONG TERM GOAL #1   Title Pt/family will verbalize understanding of fall prevention in home environment.  TARGET 11/07/16   Time 9   Period Weeks    Status New     PT LONG TERM GOAL #2   Title Pt will improve 5x sit<>stand to less than or equal to 15 seconds for improved ease and efficiency with transfers.   Time 9   Period Weeks   Status New     PT LONG TERM GOAL #3   Title Pt will improve TUG score to less than or equal to 13.5 seconds for decreased fall risk.   Time 9   Period Weeks   Status New     PT LONG TERM GOAL #4  Title Pt will improve gait velocity ot at least 1.8 ft/sec for improved gait efficiency and safety.     Time 9   Period Weeks   Status New               Plan - 10/01/16 1241    Clinical Impression Statement Patient continues to need cuing for technique with HEP. She was demonstrating decreased ability to attend to instructions in a busy gym environment (also complicated by patient's decreased hearing). Session focused on balance activities with pt demonstrating decreased ability to utilize her vestibular system to maintain balance (when somatosensory and vision blocked).    Rehab Potential Good   PT Frequency 2x / week  1x/1wk, then    PT Duration 8 weeks  plus eval   PT Treatment/Interventions ADLs/Self Care Home Management;Functional mobility training;Gait training;DME Instruction;Therapeutic activities;Therapeutic exercise;Balance training;Neuromuscular re-education;Patient/family education   PT Next Visit Plan Monitor BP for orthostasis; progress gait/walking program as able; standing balance training.    PT Home Exercise Plan OTAGO strengthening exercises added 09/16/16, 09/25/16   Consulted and Agree with Plan of Care Patient;Family member/caregiver   Family Member Consulted husband--[name]      Patient will benefit from skilled therapeutic intervention in order to improve the following deficits and impairments:  Abnormal gait, Decreased balance, Decreased mobility, Decreased strength, Difficulty walking, Postural dysfunction, Decreased endurance  Visit Diagnosis: Abnormal  posture  Unsteadiness on feet     Problem List Patient Active Problem List   Diagnosis Date Noted  . CAP (community acquired pneumonia) 04/13/2016  . PNA (pneumonia) 04/13/2016  . Murmur 09/02/2015  . Parkinson disease ([place]) 09/02/2015  . Bilateral carotid bruits 09/02/2015  . Confusion 08/07/2015  . Acute encephalopathy 08/07/2015  . UTI (lower urinary tract infection) 08/07/2015  . Acute kidney failure ([place]) 08/07/2015  . Paralysis agitans ([place]) 09/19/2012  . Lymphoma ([place]) 07/03/2011  . Malignant neoplasm of female breast ([place]) 07/03/2011  . Abnormal CT of the chest 03/13/2011  . HYPERTENSION, BENIGN 01/01/2009  . EDEMA 01/01/2009    Rexanne Mano, PT 10/01/2016, 8:26 PM  [facility] [address] [place], [name], [address] Phone: [phone]   Fax:  [phone]  Name: Ashiah Karpowicz MRN: [id number] Date of Birth: 07-Jun-1928

## 2016-10-02 ENCOUNTER — Ambulatory Visit (HOSPITAL_COMMUNITY): Payer: Medicare Other | Attending: Internal Medicine

## 2016-10-02 ENCOUNTER — Other Ambulatory Visit: Payer: Self-pay

## 2016-10-02 DIAGNOSIS — I119 Hypertensive heart disease without heart failure: Secondary | ICD-10-CM | POA: Diagnosis not present

## 2016-10-02 DIAGNOSIS — I361 Nonrheumatic tricuspid (valve) insufficiency: Secondary | ICD-10-CM | POA: Diagnosis not present

## 2016-10-02 DIAGNOSIS — I340 Nonrheumatic mitral (valve) insufficiency: Secondary | ICD-10-CM | POA: Insufficient documentation

## 2016-10-02 DIAGNOSIS — C50919 Malignant neoplasm of unspecified site of unspecified female breast: Secondary | ICD-10-CM | POA: Insufficient documentation

## 2016-10-02 DIAGNOSIS — C8590 Non-Hodgkin lymphoma, unspecified, unspecified site: Secondary | ICD-10-CM | POA: Insufficient documentation

## 2016-10-02 DIAGNOSIS — R011 Cardiac murmur, unspecified: Secondary | ICD-10-CM | POA: Diagnosis present

## 2016-10-02 DIAGNOSIS — R609 Edema, unspecified: Secondary | ICD-10-CM | POA: Diagnosis not present

## 2016-10-02 DIAGNOSIS — I350 Nonrheumatic aortic (valve) stenosis: Secondary | ICD-10-CM | POA: Diagnosis not present

## 2016-10-02 DIAGNOSIS — I429 Cardiomyopathy, unspecified: Secondary | ICD-10-CM | POA: Diagnosis present

## 2016-10-06 DIAGNOSIS — Z853 Personal history of malignant neoplasm of breast: Secondary | ICD-10-CM | POA: Diagnosis not present

## 2016-10-06 DIAGNOSIS — Z8572 Personal history of non-Hodgkin lymphomas: Secondary | ICD-10-CM | POA: Diagnosis not present

## 2016-10-06 DIAGNOSIS — G20 Parkinson's disease: Secondary | ICD-10-CM | POA: Diagnosis not present

## 2016-10-06 DIAGNOSIS — N183 Chronic kidney disease, stage 3 (moderate): Secondary | ICD-10-CM | POA: Diagnosis not present

## 2016-10-06 DIAGNOSIS — F419 Anxiety disorder, unspecified: Secondary | ICD-10-CM | POA: Diagnosis not present

## 2016-10-06 DIAGNOSIS — E7800 Pure hypercholesterolemia, unspecified: Secondary | ICD-10-CM | POA: Diagnosis not present

## 2016-10-07 ENCOUNTER — Ambulatory Visit: Payer: Medicare Other | Admitting: Physical Therapy

## 2016-10-07 DIAGNOSIS — M6281 Muscle weakness (generalized): Secondary | ICD-10-CM

## 2016-10-07 DIAGNOSIS — R278 Other lack of coordination: Secondary | ICD-10-CM | POA: Diagnosis not present

## 2016-10-07 DIAGNOSIS — R2681 Unsteadiness on feet: Secondary | ICD-10-CM | POA: Diagnosis not present

## 2016-10-07 DIAGNOSIS — R29818 Other symptoms and signs involving the nervous system: Secondary | ICD-10-CM | POA: Diagnosis not present

## 2016-10-07 DIAGNOSIS — R2689 Other abnormalities of gait and mobility: Secondary | ICD-10-CM | POA: Diagnosis not present

## 2016-10-07 DIAGNOSIS — R293 Abnormal posture: Secondary | ICD-10-CM | POA: Diagnosis not present

## 2016-10-08 NOTE — Therapy (Signed)
[facility] [address] [place], [name], [address] Phone: [phone]   Fax:  [phone]  Physical Therapy Treatment  Patient Details  Name: [name] MRN: [id number] Date of Birth: 1928/04/14 Referring Provider: Alonza Bogus  Encounter Date: 10/07/2016      PT End of Session - 10/08/16 1755    Visit Number 8   Number of Visits 18   Date for PT Re-Evaluation 11/07/16   Authorization Type Medicare primary, UHC secondary-GCODE every 10th visit   PT Start Time 1107   PT Stop Time 1148   PT Time Calculation (min) 41 min   Activity Tolerance Patient tolerated treatment well   Behavior During Therapy [facility] for tasks assessed/performed      Past Medical History:  Diagnosis Date  . Anxiety   . Asthma   . Depression   . Diabetes mellitus without complication ([place])    3/61/44.Marland KitchenMarland Kitchenpt denies  . GERD (gastroesophageal reflux disease)   . Hearing loss   . NHL (non-Hodgkin's lymphoma) ([place])    nhl dx 9/04 breast ca dx1/12  . Parkinson disease [facility])     Past Surgical History:  Procedure Laterality Date  . Ba-HA Ear implant    . MASTECTOMY  2 /8/ 12   bilateral  . MASTECTOMY      There were no vitals filed for this visit.      Subjective Assessment - 10/07/16 1111    Subjective Been doing my exercises some at home, "almost everyday".  No falls reported.   Pertinent History Lumbar radiofrequency ablation (to treat lumbar facet joint pain)-last one 08/27/16 with relief of back pain; dementia due to Parkinson's; DVT (07/2016 with aspirin x 10 days treatment)   Limitations Lifting;Standing;Walking   Patient Stated Goals Pt's goals for therapy are to improve balance and get away from cane.   Currently in Pain? No/denies  Had pain earlier, no pain at tx; took tylenol this morning            [facility] PT Assessment - 10/07/16 1117      Berg Balance Test   Sit to Stand Able to stand without using hands and  stabilize independently   Standing Unsupported Able to stand safely 2 minutes   Sitting with Back Unsupported but Feet Supported on Floor or Stool Able to sit safely and securely 2 minutes   Stand to Sit Sits safely with minimal use of hands   Transfers Able to transfer safely, minor use of hands   Standing Unsupported with Eyes Closed Able to stand 10 seconds safely   Standing Ubsupported with Feet Together Able to place feet together independently and stand 1 minute safely   From Standing, Reach Forward with Outstretched Arm Can reach confidently >25 cm (10")   From Standing Position, Pick up Object from Floor Able to pick up shoe, needs supervision   From Standing Position, Turn to Look Behind Over each Shoulder Looks behind from both sides and weight shifts well   Turn 360 Degrees Able to turn 360 degrees safely but slowly   Standing Unsupported, Alternately Place Feet on Step/Stool Able to complete 4 steps without aid or supervision   Standing Unsupported, One Foot in Front Able to plae foot ahead of the other independently and hold 30 seconds   Standing on One Leg Unable to try or needs assist to prevent fall   Total Score 46   Berg comment: Improved from 39/56  [facility] Adult PT Treatment/Exercise - 10/08/16 0001      Transfers   Transfers Sit to Stand;Stand to Sit   Sit to Stand 5: Supervision;Without upper extremity assist;From chair/3-in-1   Five time sit to stand comments  13.22   Stand to Sit 5: Supervision;Without upper extremity assist;To chair/3-in-1     Standardized Balance Assessment   Standardized Balance Assessment Timed Up and Go Test     Timed Up and Go Test   TUG Normal TUG   Normal TUG (seconds) 14.29     Self-Care   Self-Care Other Self-Care Comments   Other Self-Care Comments  Discussed progress towards goals, as well as POC, continueing to work towards dynamic balance and gait activities.  Husband questions when OT/speech  orders can be obtained, based on conversation from initial eval.  Discussed with patient and husband at end of session and pt in agreement to request OT and speech orders.             Balance Exercises - 10/08/16 1750      OTAGO PROGRAM   Knee Bends 10 reps, support   Backwards Walking Support   Sideways Walking Assistive device   Tandem Stance 10 seconds, support  modified to semi-tandem stance   One Leg Stand 10 seconds, support   Sit to Stand 5 reps, one support  no UE support with sit<>stand   Overall OTAGO Comments single limb stance and sit<>stand to HEP as above. Pt now has complete OTAGO program (that is appropriate for patient)           PT Education - 10/08/16 1755    Education provided Yes   Education Details Progress towards goals, continuing PT towards LTGs   Person(s) Educated Patient;Spouse  spouse present at end of session   Methods Explanation   Comprehension Verbalized understanding    Additions to HEP-single limb stance and sit<>stand.  Pt return demo understanding with cues      PT Short Term Goals - 10/07/16 1112      PT SHORT TERM GOAL #1   Title Pt will perform HEP with family supervision, for improved balance, transfers, gait.  TARGET 10/07/16   Time 4   Period Weeks   Status Achieved     PT SHORT TERM GOAL #2   Title Pt will improve 5x sit<>stand to less than or equal to 20 seconds for improved transfer efficiency and safety.   Baseline 13.22 sec on 10/07/16   Time 4   Period Weeks   Status Achieved     PT SHORT TERM GOAL #3   Title Berg Balance test to be assessed, with pt to improve by at least 5 points, for decreased fall risk.   Baseline Berg 39/56 on 09/25/16, with goal:  44/56; 10/07/16:  Merrilee Jansky score 46/56   Time 4   Period Weeks   Status Achieved     PT SHORT TERM GOAL #4   Title Pt will improve TUG score to less than or equal to 18 seconds for decreased fall risk.   Baseline 14.29 sec on 10/07/16 (no cane)   Time 4   Period  Weeks   Status Achieved           PT Long Term Goals - 09/08/16 1416      PT LONG TERM GOAL #1   Title Pt/family will verbalize understanding of fall prevention in home environment.  TARGET 11/07/16   Time 9   Period Weeks   Status New  PT LONG TERM GOAL #2   Title Pt will improve 5x sit<>stand to less than or equal to 15 seconds for improved ease and efficiency with transfers.   Time 9   Period Weeks   Status New     PT LONG TERM GOAL #3   Title Pt will improve TUG score to less than or equal to 13.5 seconds for decreased fall risk.   Time 9   Period Weeks   Status New     PT LONG TERM GOAL #4   Title Pt will improve gait velocity ot at least 1.8 ft/sec for improved gait efficiency and safety.     Time 9   Period Weeks   Status New               Plan - 10/08/16 1756    Clinical Impression Statement Focused session today on assessing STGs.  Pt has met all STGs.  She reports performing HEP nearly everyday.  Pt's Berg, TUG and 5x sit<>stand scores have all improved.  Pt will continue to benefit from skilled PT towards LTGs for improved strength, balance, gait and overall improved functional mobility.   Rehab Potential Good   PT Frequency 2x / week  1x/1wk, then    PT Duration 8 weeks  plus eval   PT Treatment/Interventions ADLs/Self Care Home Management;Functional mobility training;Gait training;DME Instruction;Therapeutic activities;Therapeutic exercise;Balance training;Neuromuscular re-education;Patient/family education   PT Next Visit Plan Monitor BP for orthostasis; progress gait/walking program as able as part of HEP; dynamic balance and gait training   PT Home Exercise Plan OTAGO strengthening exercises added 09/16/16, 09/25/16, 10/08/16   Consulted and Agree with Plan of Care Patient;Family member/caregiver   Family Member Consulted husband--[name]      Patient will benefit from skilled therapeutic intervention in order to improve the following deficits and  impairments:  Abnormal gait, Decreased balance, Decreased mobility, Decreased strength, Difficulty walking, Postural dysfunction, Decreased endurance  Visit Diagnosis: Unsteadiness on feet  Muscle weakness (generalized)  Other abnormalities of gait and mobility     Problem List Patient Active Problem List   Diagnosis Date Noted  . CAP (community acquired pneumonia) 04/13/2016  . PNA (pneumonia) 04/13/2016  . Murmur 09/02/2015  . Parkinson disease ([place]) 09/02/2015  . Bilateral carotid bruits 09/02/2015  . Confusion 08/07/2015  . Acute encephalopathy 08/07/2015  . UTI (lower urinary tract infection) 08/07/2015  . Acute kidney failure ([place]) 08/07/2015  . Paralysis agitans ([place]) 09/19/2012  . Lymphoma ([place]) 07/03/2011  . Malignant neoplasm of female breast ([place]) 07/03/2011  . Abnormal CT of the chest 03/13/2011  . HYPERTENSION, BENIGN 01/01/2009  . EDEMA 01/01/2009    MARRIOTT,AMY W. 10/08/2016, 6:00 PM  [facility] [address] [place], [name], [address] Phone: [phone]   Fax:  [phone]  Name: [name] MRN: [id number] Date of Birth: Mar 31, 1928

## 2016-10-09 ENCOUNTER — Ambulatory Visit: Payer: Medicare Other | Admitting: Physical Therapy

## 2016-10-15 ENCOUNTER — Ambulatory Visit: Payer: Medicare Other

## 2016-10-15 ENCOUNTER — Ambulatory Visit: Payer: Medicare Other | Admitting: Physical Therapy

## 2016-10-15 ENCOUNTER — Telehealth: Payer: Self-pay | Admitting: Neurology

## 2016-10-15 DIAGNOSIS — R2689 Other abnormalities of gait and mobility: Secondary | ICD-10-CM | POA: Diagnosis not present

## 2016-10-15 DIAGNOSIS — R41841 Cognitive communication deficit: Secondary | ICD-10-CM

## 2016-10-15 DIAGNOSIS — G20 Parkinson's disease: Secondary | ICD-10-CM

## 2016-10-15 DIAGNOSIS — R29818 Other symptoms and signs involving the nervous system: Secondary | ICD-10-CM | POA: Diagnosis not present

## 2016-10-15 DIAGNOSIS — R2681 Unsteadiness on feet: Secondary | ICD-10-CM | POA: Diagnosis not present

## 2016-10-15 DIAGNOSIS — R278 Other lack of coordination: Secondary | ICD-10-CM | POA: Diagnosis not present

## 2016-10-15 DIAGNOSIS — R293 Abnormal posture: Secondary | ICD-10-CM | POA: Diagnosis not present

## 2016-10-15 DIAGNOSIS — M6281 Muscle weakness (generalized): Secondary | ICD-10-CM | POA: Diagnosis not present

## 2016-10-15 DIAGNOSIS — R471 Dysarthria and anarthria: Secondary | ICD-10-CM

## 2016-10-15 NOTE — Telephone Encounter (Signed)
Order entered

## 2016-10-15 NOTE — Patient Instructions (Signed)
   Five loud "ah" twice a day.  Bring in 10 sentences you say EVERY DAY.

## 2016-10-15 NOTE — Therapy (Signed)
[facility] [address] [place], [name], [address] Phone: [phone]   Fax:  [phone]  Speech Language Pathology Evaluation  Patient Details  Name: [name] MRN: [id number] Date of Birth: 1928/03/21 Referring Provider: Alonza Bogus, D.O.  Encounter Date: 10/15/2016      End of Session - 10/15/16 1309    Visit Number 1   Number of Visits 17   Date for SLP Re-Evaluation 12/25/16   SLP Start Time 1103   SLP Stop Time  1148   SLP Time Calculation (min) 45 min   Activity Tolerance Patient tolerated treatment well      Past Medical History:  Diagnosis Date  . Anxiety   . Asthma   . Depression   . Diabetes mellitus without complication ([place])    0/94/70.Marland KitchenMarland Kitchenpt denies  . GERD (gastroesophageal reflux disease)   . Hearing loss   . NHL (non-Hodgkin's lymphoma) ([place])    nhl dx 9/04 breast ca dx1/12  . Parkinson disease [facility])     Past Surgical History:  Procedure Laterality Date  . Ba-HA Ear implant    . MASTECTOMY  2 /8/ 12   bilateral  . MASTECTOMY      There were no vitals filed for this visit.      Subjective Assessment - 10/15/16 1111    Subjective "The main thing is, I hear him say 'Talk louder!' "    Patient is accompained by: --  husband, [name]   Currently in Pain? Yes   Pain Score 4    Pain Location Back   Pain Orientation Lower;Mid   Pain Descriptors / Indicators Aching   Pain Type Chronic pain   Pain Onset More than a month ago   Pain Frequency Constant            SLP Evaluation [facility] - 10/15/16 1111      SLP Visit Information   SLP Received On 10/15/16   Referring Provider Tat, Wells Guiles, D.O.   Medical Diagnosis Parkinson's Disease     Prior Functional Status   Cognitive/Linguistic Baseline Baseline deficits   Baseline deficit details Memory, attention   Type of Home House    Lives With Spouse   Available Support Family     Cognition   Overall Cognitive Status  History of cognitive impairments - at baseline   Area of Impairment Memory;Attention   Attention Comments alternating and divided attention can be challenging to pt at times.   Memory Comments Pt requires husband for details of situations.   Behaviors --     Oral Motor/Sensory Function   Overall Oral Motor/Sensory Function WFL             Motor Speech   Overall Motor Speech Impaired   Respiration Impaired   Level of Impairment Phrase   Phonation Low vocal intensity  average 67dB in 10 minutes conversation   Intelligibility Intelligibility reduced   Conversation 75-100% accurate  95% in quiet environment   Interfering Components Premorbid status   Effective Techniques Increased vocal intensity     Measured when a sound level meter was placed 30 cm away from pt's mouth, 7 minutes of conversational speech was reduced today, at average 67dB (WNL= average 70-72dB) with range of 62-72dB. Overall speech intelligibility for this listener in a quiet environment was not affected, at approx 100%. Production of loud /a/ averaged 82dB (range of 76 to 88) and mod cues usually needed for loudness.   In question and answer task  pt was asked to use the same amount of effort as with loud /a/. Loudness average with this increased effort was 70dB (range of 63 to 73dB) with occasional min-mod A for loudness. Pt would benefit from skilled ST in order to improve speech intelligibility and pt's QOL.  SLP will monitor pt's swallowing ability during the course of therapy.                   SLP Education - 18-Oct-2016 1308    Education provided Yes   Education Details loud /a/, need to complete loud /a/ daily, compensating for forgetting to complete (alarms, association) loud /a/, therapy course, goals   Person(s) Educated Patient;Spouse   Methods Explanation;Demonstration;Verbal cues   Comprehension Verbalized understanding;Returned demonstration;Need further instruction;Verbal cues required           SLP Short Term Goals - Oct 18, 2016 1311      SLP SHORT TERM GOAL #1   Title Pt will demonstrate sustained /a/ of 85dB with rare min A over 3 sessions   Time 4   Period Weeks   Status New     SLP SHORT TERM GOAL #2   Title Pt will maintain average of 70dB during structured speech tasks with occassional min A over 3 sessions    Time 4   Period Weeks   Status New          SLP Long Term Goals - 10-18-2016 1313      SLP LONG TERM GOAL #1   Title Maintain an average of 70dB during conversation over 10 minutes with rare min A over two sessions    Time 8   Period Weeks   Status New     SLP LONG TERM GOAL #2   Title Converse audilbly in a noisy environment for 8 minutes with rare min A    Time 8   Period Weeks   Status New     SLP LONG TERM GOAL #3   Title pt will report husband asking less for her to repeat, than before ST   Time 8   Period Weeks   Status New          Plan - 2016-10-18 1309    Clinical Impression Statement Pt presents with dysarthria mainly due to changes in speech volume due to Parkinson's disease. Pt reports husband has difficulty hearing her and she would benefit from skilled ST focusing on increasing her conversational loudness.   Speech Therapy Frequency 2x / week   Duration --  8 weeks   Treatment/Interventions Compensatory strategies;Patient/family education;Functional tasks;Cueing hierarchy;SLP instruction and feedback;Internal/external aids;Cognitive reorganization  any or all may be used   Potential to Achieve Goals Fair   Potential Considerations Previous level of function;Severity of impairments   Consulted and Agree with Plan of Care Patient;Family member/caregiver   Family Member Consulted husband      Patient will benefit from skilled therapeutic intervention in order to improve the following deficits and impairments:   Dysarthria and anarthria  Cognitive communication deficit      G-Codes - 10/18/16 1315    Functional  Assessment Tool Used noms   Functional Limitations Motor speech   Motor Speech Current Status [phone]) At least 40 percent but less than 60 percent impaired, limited or restricted   Motor Speech Goal Status ([id number]) At least 20 percent but less than 40 percent impaired, limited or restricted      Problem List Patient Active Problem List   Diagnosis Date Noted  .  CAP (community acquired pneumonia) 04/13/2016  . PNA (pneumonia) 04/13/2016  . Murmur 09/02/2015  . Parkinson disease ([place]) 09/02/2015  . Bilateral carotid bruits 09/02/2015  . Confusion 08/07/2015  . Acute encephalopathy 08/07/2015  . UTI (lower urinary tract infection) 08/07/2015  . Acute kidney failure ([place]) 08/07/2015  . Paralysis agitans ([place]) 09/19/2012  . Lymphoma ([place]) 07/03/2011  . Malignant neoplasm of female breast ([place]) 07/03/2011  . Abnormal CT of the chest 03/13/2011  . HYPERTENSION, BENIGN 01/01/2009  . EDEMA 01/01/2009    [facility] ,MS, CCC-SLP  10/15/2016, 1:15 PM  [facility] [address] [place], [name], [address] Phone: [phone]   Fax:  [phone]  Name: [name] MRN: [id number] Date of Birth: 11-May-1928

## 2016-10-15 NOTE — Patient Instructions (Addendum)
WALKING  Walking is a great form of exercise to increase your strength, endurance and overall fitness.  A walking program can help you start slowly and gradually build endurance as you go.  Everyone's ability is different, so each person's starting point will be different.  You do not have to follow them exactly.  The are just samples. You should simply find out what's right for you and stick to that program.   In the beginning, you'll start off walking 2-3 times a day for short distances.  As you get stronger, you'll be walking further at just 1-2 times per day.  A. You Can Walk For A Certain Length Of Time Each Day    Walk 5 minutes 2- 3 times per day.  Increase 2 minutes every 2-3 days (2-3 times per day).  Work up to 25-30 minutes (1-2 times per day).   Example:   Day 1-2 5 minutes 2-3 times per day   Day 7-8 12 minutes 2-3 times per day   Day 13-14 25 minutes 1-2 times per day  B. You Can Walk For a Certain Distance Each Day     Distance can be substituted for time.    Example:   3 trips to mailbox (at road)   3 trips to corner of block   3 trips around the block  C. Go to your church and use the track.     Please only do the exercises that your therapist has initialed and dated

## 2016-10-15 NOTE — Telephone Encounter (Signed)
-----   Message from Frazier Butt, PT sent at 10/15/2016  8:01 AM EDT ----- Regarding: Request for OT, speech referrals Wells Guiles, We have been seeing [name] for PT and pt/husband are interested in OT for UE strengthening, ADLs and speech for voice volume work.  If you agree, could you please send referral for OT and speech?  Thanks so much.  Mady Haagensen, PT

## 2016-10-16 ENCOUNTER — Ambulatory Visit: Payer: Medicare Other | Admitting: Physical Therapy

## 2016-10-16 DIAGNOSIS — R2681 Unsteadiness on feet: Secondary | ICD-10-CM | POA: Diagnosis not present

## 2016-10-16 DIAGNOSIS — R278 Other lack of coordination: Secondary | ICD-10-CM | POA: Diagnosis not present

## 2016-10-16 DIAGNOSIS — R293 Abnormal posture: Secondary | ICD-10-CM | POA: Diagnosis not present

## 2016-10-16 DIAGNOSIS — M6281 Muscle weakness (generalized): Secondary | ICD-10-CM | POA: Diagnosis not present

## 2016-10-16 DIAGNOSIS — R2689 Other abnormalities of gait and mobility: Secondary | ICD-10-CM | POA: Diagnosis not present

## 2016-10-16 DIAGNOSIS — R29818 Other symptoms and signs involving the nervous system: Secondary | ICD-10-CM | POA: Diagnosis not present

## 2016-10-16 NOTE — Therapy (Signed)
[facility] [address] [place], [name], [address] Phone: [phone]   Fax:  [phone]  Physical Therapy Treatment  Patient Details  Name: [name] MRN: [id number] Date of Birth: 1927/09/18 Referring Provider: Alonza Bogus  Encounter Date: 10/15/2016      PT End of Session - 10/16/16 0826    Visit Number 9   Number of Visits 18   Date for PT Re-Evaluation 11/07/16   Authorization Type Medicare primary, UHC secondary-GCODE every 10th visit   PT Start Time 1022   PT Stop Time 1101   PT Time Calculation (min) 39 min   Activity Tolerance Patient tolerated treatment well   Behavior During Therapy [facility] for tasks assessed/performed      Past Medical History:  Diagnosis Date  . Anxiety   . Asthma   . Depression   . Diabetes mellitus without complication ([place])    7/61/95.Marland KitchenMarland Kitchenpt denies  . GERD (gastroesophageal reflux disease)   . Hearing loss   . NHL (non-Hodgkin's lymphoma) ([place])    nhl dx 9/04 breast ca dx1/12  . Parkinson disease [facility])     Past Surgical History:  Procedure Laterality Date  . Ba-HA Ear implant    . MASTECTOMY  2 /8/ 12   bilateral  . MASTECTOMY      There were no vitals filed for this visit.      Subjective Assessment - 10/15/16 1024    Subjective No changes, no falls since last visit.  Had 2 days where I didn't do my exercises because we were out all day.   Pertinent History Lumbar radiofrequency ablation (to treat lumbar facet joint pain)-last one 08/27/16 with relief of back pain; dementia due to Parkinson's; DVT (07/2016 with aspirin x 10 days treatment)   Limitations Lifting;Standing;Walking   Patient Stated Goals Pt's goals for therapy are to improve balance and get away from cane.   Currently in Pain? Yes   Pain Score 4    Pain Location Back   Pain Orientation Mid;Lower   Pain Descriptors / Indicators Aching   Pain Type Chronic pain   Pain Onset More than a month  ago   Pain Frequency Constant   Aggravating Factors  hurts when I get up and make the bed   Pain Relieving Factors Lying down alleviates pain.                         [facility] Adult PT Treatment/Exercise - 10/15/16 1036      Transfers   Transfers Sit to Stand;Stand to Sit   Sit to Stand 6: Modified independent (Device/Increase time)   Stand to Sit 6: Modified independent (Device/Increase time)   Comments Multiple reps through session.  Cues provided for upright posture upon standing.     Ambulation/Gait   Ambulation/Gait Yes   Ambulation/Gait Assistance 5: Supervision   Ambulation Distance (Feet) 603 Feet  in 5 minute walk; then 443 ft   Assistive device Straight cane   Gait Pattern Step-through pattern;Decreased arm swing - left;Decreased step length - left;Narrow base of support   Ambulation Surface Level;Indoor   Pre-Gait Activities Adjusted cane to appropriate height, as cane appears too high and is causing elevation of R shoulder during gait.  Once cane is lowered, pt demo improved neutral posture of R shoulder.   Gait Comments Second bout of gait includes conversation tasks and quick changes of direction due to busy environment in gym.  No LOB noted  during gait activities, but PT does provide cues several times during gait to correct forward flexed posture.    Discussed and provided education in walking program for home, including options for where to walk-home, driveway, church track.         Balance Exercises - 10/16/16 0822      Balance Exercises: Standing   Standing Eyes Opened Wide (BOA);Head turns;Foam/compliant surface;5 reps  UE support, also head nods x 5 reps   SLS Eyes open;Upper extremity support 2;Other reps (comment)  Alternating step taps to 6" step x 10 reps   Retro Gait Upper extremity support;3 reps  Forward/back in parallel bars    Marching Limitations Marching forward then retro walking in parallel bars, 3 reps 10 ft with UE support    Other Standing Exercises Standing on foam:  marching in place x 10 reps, then alternating forward kicks x 10 reps, then alternating forward step taps x 10 reps with UE support of parallel bars and close supervision/min guard assistance           PT Education - 10/16/16 0825    Education provided Yes   Education Details Walking program progression   Person(s) Educated Patient   Methods Explanation;Demonstration;Handout   Comprehension Verbalized understanding          PT Short Term Goals - 10/07/16 1112      PT SHORT TERM GOAL #1   Title Pt will perform HEP with family supervision, for improved balance, transfers, gait.  TARGET 10/07/16   Time 4   Period Weeks   Status Achieved     PT SHORT TERM GOAL #2   Title Pt will improve 5x sit<>stand to less than or equal to 20 seconds for improved transfer efficiency and safety.   Baseline 13.22 sec on 10/07/16   Time 4   Period Weeks   Status Achieved     PT SHORT TERM GOAL #3   Title Berg Balance test to be assessed, with pt to improve by at least 5 points, for decreased fall risk.   Baseline Berg 39/56 on 09/25/16, with goal:  44/56; 10/07/16:  Merrilee Jansky score 46/56   Time 4   Period Weeks   Status Achieved     PT SHORT TERM GOAL #4   Title Pt will improve TUG score to less than or equal to 18 seconds for decreased fall risk.   Baseline 14.29 sec on 10/07/16 (no cane)   Time 4   Period Weeks   Status Achieved           PT Long Term Goals - 09/08/16 1416      PT LONG TERM GOAL #1   Title Pt/family will verbalize understanding of fall prevention in home environment.  TARGET 11/07/16   Time 9   Period Weeks   Status New     PT LONG TERM GOAL #2   Title Pt will improve 5x sit<>stand to less than or equal to 15 seconds for improved ease and efficiency with transfers.   Time 9   Period Weeks   Status New     PT LONG TERM GOAL #3   Title Pt will improve TUG score to less than or equal to 13.5 seconds for decreased fall risk.    Time 9   Period Weeks   Status New     PT LONG TERM GOAL #4   Title Pt will improve gait velocity ot at least 1.8 ft/sec for improved gait efficiency and safety.  Time 9   Period Weeks   Status New               Plan - 10/16/16 0826    Clinical Impression Statement Addressed gait activities today with progression of walking program for home; discussed starting with 5 minutes (pt reports pt she has ambulated at 20 minutes, but hasn't done in a while).  Also address compliant surfaces and dynamic balance activities.  Pt will continue to benefit from skilled PT to address balance, gait and improved mobility.   Rehab Potential Good   PT Frequency 2x / week  1x/1wk, then    PT Duration 8 weeks  plus eval   PT Treatment/Interventions ADLs/Self Care Home Management;Functional mobility training;Gait training;DME Instruction;Therapeutic activities;Therapeutic exercise;Balance training;Neuromuscular re-education;Patient/family education   PT Next Visit Plan Monitor BP for orthostasis as needed; check on progression of gait/walking program as able as part of HEP; dynamic balance/compliant surface and gait training   PT Home Exercise Plan OTAGO strengthening exercises added 09/16/16, 09/25/16, 10/08/16   Consulted and Agree with Plan of Care Patient;Family member/caregiver   Family Member Consulted husband--[name]      Patient will benefit from skilled therapeutic intervention in order to improve the following deficits and impairments:  Abnormal gait, Decreased balance, Decreased mobility, Decreased strength, Difficulty walking, Postural dysfunction, Decreased endurance  Visit Diagnosis: Other abnormalities of gait and mobility  Unsteadiness on feet     Problem List Patient Active Problem List   Diagnosis Date Noted  . CAP (community acquired pneumonia) 04/13/2016  . PNA (pneumonia) 04/13/2016  . Murmur 09/02/2015  . Parkinson disease ([place]) 09/02/2015  . Bilateral carotid bruits  09/02/2015  . Confusion 08/07/2015  . Acute encephalopathy 08/07/2015  . UTI (lower urinary tract infection) 08/07/2015  . Acute kidney failure ([place]) 08/07/2015  . Paralysis agitans ([place]) 09/19/2012  . Lymphoma ([place]) 07/03/2011  . Malignant neoplasm of female breast ([place]) 07/03/2011  . Abnormal CT of the chest 03/13/2011  . HYPERTENSION, BENIGN 01/01/2009  . EDEMA 01/01/2009    Lynise Porr W. 10/16/2016, 8:30 AM  Frazier Butt., PT  [facility] [address] [place], [name], [address] Phone: [phone]   Fax:  [phone]  Name: [name] MRN: [id number] Date of Birth: [birth year]

## 2016-10-16 NOTE — Therapy (Signed)
[facility] [address] [place], [name], [address] Phone: [phone]   Fax:  [phone]  Physical Therapy Treatment  Patient Details  Name: [name] MRN: [id number] Date of Birth: 10-03-1927 Referring Provider: Alonza Bogus  Encounter Date: 10/16/2016      PT End of Session - 10/16/16 1314    Visit Number 10   Number of Visits 18   Date for PT Re-Evaluation 11/07/16   Authorization Type Medicare primary, UHC secondary-GCODE every 10th visit   PT Start Time 1020   PT Stop Time 1100   PT Time Calculation (min) 40 min   Activity Tolerance Patient tolerated treatment well   Behavior During Therapy [facility] for tasks assessed/performed      Past Medical History:  Diagnosis Date  . Anxiety   . Asthma   . Depression   . Diabetes mellitus without complication ([place])    2/42/35.Marland KitchenMarland Kitchenpt denies  . GERD (gastroesophageal reflux disease)   . Hearing loss   . NHL (non-Hodgkin's lymphoma) ([place])    nhl dx 9/04 breast ca dx1/12  . Parkinson disease [facility])     Past Surgical History:  Procedure Laterality Date  . Ba-HA Ear implant    . MASTECTOMY  2 /8/ 12   bilateral  . MASTECTOMY      There were no vitals filed for this visit.      Subjective Assessment - 10/16/16 1023    Subjective No changes since yesterday.     Pertinent History Lumbar radiofrequency ablation (to treat lumbar facet joint pain)-last one 08/27/16 with relief of back pain; dementia due to Parkinson's; DVT (07/2016 with aspirin x 10 days treatment)   Limitations Lifting;Standing;Walking   Patient Stated Goals Pt's goals for therapy are to improve balance and get away from cane.   Currently in Pain? Yes   Pain Score 3    Pain Location Back   Pain Orientation Lower;Mid   Pain Descriptors / Indicators Aching   Pain Type Chronic pain   Pain Onset More than a month ago   Aggravating Factors  hurts when I get up and make the bed   Pain Relieving  Factors Lying down alleviates pain.                         [place] Adult PT Treatment/Exercise - 10/16/16 1025      Ambulation/Gait   Ambulation/Gait Yes   Ambulation/Gait Assistance 5: Supervision   Ambulation/Gait Assistance Details Cues provided several times during gait for upright posture   Ambulation Distance (Feet) 600 Feet  then 230 ft no cane   Assistive device Straight cane   Pre-Gait Activities Noted increased lateral sway with gait with no cane used.  Recommend pt continue using cane for improved gait safety and stability.     High Level Balance   High Level Balance Comments On ramp incline/decline:  standing feet apart with head turns x 5, head nods x 5; then marching in place x 10 reps; on incline-forward altenating step taps with min guard assist.  On decline marching, pt requires HHA.             Balance Exercises - 10/16/16 1037      Balance Exercises: Standing   Standing Eyes Opened Wide (BOA);Narrow base of support (BOS);Foam/compliant surface;5 reps;Head turns  and Head nods   SLS Eyes open;Foam/compliant surface;Upper extremity support 1;Other reps (comment)  10 reps alternating taps to cones   Retro  Gait Upper extremity support;3 reps;Foam/compliant surface  Forward/back at counter x 5 reps   Sidestepping Upper extremity support;Foam/compliant support;3 reps  length of counter   Marching Limitations Marching forward/back on foam mat at counter, 3 reps each direction.   Other Standing Exercises Standing on foam:  marching in place x 10 reps, then alternating forward kicks x 10 reps, then alternating forward step taps x 10 reps with 1 UE support of counter and min guard assistance.             PT Short Term Goals - 10/07/16 1112      PT SHORT TERM GOAL #1   Title Pt will perform HEP with family supervision, for improved balance, transfers, gait.  TARGET 10/07/16   Time 4   Period Weeks   Status Achieved     PT SHORT TERM GOAL #2    Title Pt will improve 5x sit<>stand to less than or equal to 20 seconds for improved transfer efficiency and safety.   Baseline 13.22 sec on 10/07/16   Time 4   Period Weeks   Status Achieved     PT SHORT TERM GOAL #3   Title Berg Balance test to be assessed, with pt to improve by at least 5 points, for decreased fall risk.   Baseline Berg 39/56 on 09/25/16, with goal:  44/56; 10/07/16:  Merrilee Jansky score 46/56   Time 4   Period Weeks   Status Achieved     PT SHORT TERM GOAL #4   Title Pt will improve TUG score to less than or equal to 18 seconds for decreased fall risk.   Baseline 14.29 sec on 10/07/16 (no cane)   Time 4   Period Weeks   Status Achieved           PT Long Term Goals - 09/08/16 1416      PT LONG TERM GOAL #1   Title Pt/family will verbalize understanding of fall prevention in home environment.  TARGET 11/07/16   Time 9   Period Weeks   Status New     PT LONG TERM GOAL #2   Title Pt will improve 5x sit<>stand to less than or equal to 15 seconds for improved ease and efficiency with transfers.   Time 9   Period Weeks   Status New     PT LONG TERM GOAL #3   Title Pt will improve TUG score to less than or equal to 13.5 seconds for decreased fall risk.   Time 9   Period Weeks   Status New     PT LONG TERM GOAL #4   Title Pt will improve gait velocity ot at least 1.8 ft/sec for improved gait efficiency and safety.     Time 9   Period Weeks   Status New               Plan - 10/16/16 1315    Clinical Impression Statement Pt tolerates compliant surface balance activities well, with pt needing most assistance (HHA/min assist) for standing on decline with marching in place.  Pt noted to have increased lateral sway without use of cane, and PT recommends continued use of cane, especially for long distances, for continued safety and stability.   Rehab Potential Good   PT Frequency 2x / week  1x/1wk, then    PT Duration 8 weeks  plus eval   PT  Treatment/Interventions ADLs/Self Care Home Management;Functional mobility training;Gait training;DME Instruction;Therapeutic activities;Therapeutic exercise;Balance training;Neuromuscular re-education;Patient/family education  PT Next Visit Plan Monitor BP for orthostasis as needed; check on progression of gait/walking program as able as part of HEP; continue dynamic balance/compliant surface and gait training   PT Home Exercise Plan OTAGO strengthening exercises added 09/16/16, 09/25/16, 10/08/16   Consulted and Agree with Plan of Care Patient;Family member/caregiver   Family Member Consulted husband--[name]      Patient will benefit from skilled therapeutic intervention in order to improve the following deficits and impairments:  Abnormal gait, Decreased balance, Decreased mobility, Decreased strength, Difficulty walking, Postural dysfunction, Decreased endurance  Visit Diagnosis: Unsteadiness on feet  Abnormal posture  Other abnormalities of gait and mobility       G-Codes - 03-Nov-2016 1318    Functional Assessment Tool Used (Outpatient Only) 5x sit<>stand 13.22 sec, TUG 14.29 sec, Berg 46/56   Functional Limitation Mobility: Walking and moving around   Mobility: Walking and Moving Around Current Status [phone]) At least 20 percent but less than 40 percent impaired, limited or restricted   Mobility: Walking and Moving Around Goal Status [phone]) At least 20 percent but less than 40 percent impaired, limited or restricted      Problem List Patient Active Problem List   Diagnosis Date Noted  . CAP (community acquired pneumonia) 04/13/2016  . PNA (pneumonia) 04/13/2016  . Murmur 09/02/2015  . Parkinson disease ([place]) 09/02/2015  . Bilateral carotid bruits 09/02/2015  . Confusion 08/07/2015  . Acute encephalopathy 08/07/2015  . UTI (lower urinary tract infection) 08/07/2015  . Acute kidney failure ([place]) 08/07/2015  . Paralysis agitans ([place]) 09/19/2012  . Lymphoma ([place]) 07/03/2011  .  Malignant neoplasm of female breast ([place]) 07/03/2011  . Abnormal CT of the chest 03/13/2011  . HYPERTENSION, BENIGN 01/01/2009  . EDEMA 01/01/2009    Tashay Bozich W. 03-Nov-2016, 1:19 PM [facility] [address] [place], [name], [address] Phone: [phone]   Fax:  [phone]  Name: [name] MRN: [id number] Date of Birth: [birth year]   Physical Therapy Progress Note  Dates of Reporting Period: 09/08/16 to 2016-11-03   Objective Reports of Subjective Statement: Merrilee Jansky, 5x sit<>stand, TUG  Objective Measurements: TUG 14.29 sec, 5x sit<>Stand 13.22 sec, Berg 46/56  Goal Update: Pt has met all STGs, and is on target towards LTGs.  Plan: Continue PT, with focus on dynamic balance and gait, compliant surface activities towards LTGs.  Reason Skilled Services are Required: Pt has improved mobility and balance measures, but remains at fall risk and would continue to benefit from skilled PT to improve functional mobility and decreased fall risk.  Mady Haagensen, PT 03-Nov-2016 1:23 PM Phone: [phone] Fax: [phone]

## 2016-10-19 ENCOUNTER — Encounter: Payer: Medicare Other | Admitting: Occupational Therapy

## 2016-10-21 ENCOUNTER — Ambulatory Visit: Payer: Medicare Other | Admitting: Occupational Therapy

## 2016-10-21 DIAGNOSIS — R2681 Unsteadiness on feet: Secondary | ICD-10-CM | POA: Diagnosis not present

## 2016-10-21 DIAGNOSIS — R29818 Other symptoms and signs involving the nervous system: Secondary | ICD-10-CM

## 2016-10-21 DIAGNOSIS — R2689 Other abnormalities of gait and mobility: Secondary | ICD-10-CM | POA: Diagnosis not present

## 2016-10-21 DIAGNOSIS — R41844 Frontal lobe and executive function deficit: Secondary | ICD-10-CM

## 2016-10-21 DIAGNOSIS — R278 Other lack of coordination: Secondary | ICD-10-CM

## 2016-10-21 DIAGNOSIS — R293 Abnormal posture: Secondary | ICD-10-CM | POA: Diagnosis not present

## 2016-10-21 DIAGNOSIS — M6281 Muscle weakness (generalized): Secondary | ICD-10-CM | POA: Diagnosis not present

## 2016-10-22 NOTE — Therapy (Signed)
[facility] [address] [place], [name], [address] Phone: [phone]   Fax:  [phone]  Occupational Therapy Evaluation  Patient Details  Name: [name] MRN: [id number] Date of Birth: 1928-07-21 Referring Provider: Dr. [name]  Encounter Date: 10/21/2016      OT End of Session - 10/22/16 1243    Visit Number 1   Number of Visits 17   Date for OT Re-Evaluation 12/20/16   Authorization Type MCR, UHC secondary   Authorization - Visit Number 1   Authorization - Number of Visits 10   OT Start Time 1451   OT Stop Time 1530   OT Time Calculation (min) 39 min   Activity Tolerance Patient tolerated treatment well   Behavior During Therapy [facility] for tasks assessed/performed      Past Medical History:  Diagnosis Date  . Anxiety   . Asthma   . Depression   . Diabetes mellitus without complication ([place])    09/06/92.Marland KitchenMarland Kitchenpt denies  . GERD (gastroesophageal reflux disease)   . Hearing loss   . NHL (non-Hodgkin's lymphoma) ([place])    nhl dx 9/04 breast ca dx1/12  . Parkinson disease [facility])     Past Surgical History:  Procedure Laterality Date  . Ba-HA Ear implant    . MASTECTOMY  2 /8/ 12   bilateral  . MASTECTOMY      There were no vitals filed for this visit.      Subjective Assessment - 10/22/16 1223    Subjective  Pt with Parkinson's disease returns to therapy with a decline in ADLs and mobility   Pertinent History Lumbar radiofrequency ablation (to treat lumbar facet joint pain)-last one 08/27/16 with relief of back pain; dementia due to Parkinson's; DVT (07/2016)   Patient Stated Goals improve independence with daily activities   Currently in Pain? No/denies           [facility] OT Assessment - 10/22/16 0001      Assessment   Diagnosis Parkinson's disease   Referring Provider Dr. [name]   Onset Date 10/15/16   Prior Therapy OT, PT, ST     Precautions   Precautions Fall   Precaution Comments hx of  breast CA     Balance Screen   Has the patient fallen in the past 6 months Yes   How many times? 1   Has the patient had a decrease in activity level because of a fear of falling?  Yes   Is the patient reluctant to leave their home because of a fear of falling?  No     Home  Environment   Family/patient expects to be discharged to: Private residence   Home Access Stairs   Bathroom Shower/Tub [place]  does not have a seat, 1 grab bar   Lives With Spouse     Prior Function   Level of Independence Independent with basic ADLs;Independent with household mobility without device;Independent with community mobility without device   Vocation Retired   Leisure Enjoys doing her chores at home, goes to church, goes out to eat     ADL   Eating/Feeding Independent   Grooming Modified independent   Upper Body Bathing Modified independent   Lower Body Bathing Modified independent  stands in shower   Upper Body Dressing Minimal assistance  difficulty fastening bra and donning shirt    Lower Body Dressing Increased time  difficulty pulling on socks   Toilet Tranfer Modified independent   Tub/Shower Transfer Modified independent  ADL comments Pt reports frequent urination     IADL   Shopping Needs to be accompanied on any shopping trip   Light Housekeeping Performs light daily tasks such as dishwashing, bed making  has assistance for heavier cleaning   Meal Prep --  Pt does some simple cooking with assistance   Medication Management --  Pt's husband assists with filling pillbox   Financial Management Dependent     Mobility   Mobility Status --  modified independent with cane      Written Expression   Dominant Hand Right   Handwriting 90% legible;Mild micrographia     Vision - History   Baseline Vision Bifocals     Vision Assessment   Vision Assessment Vision not tested   Comment Pt reports that sometimes she is looking at an items and does not see it or it does not  register as what she is looking for,     Activity Tolerance   Activity Tolerance Comments Pt reports she fatigues very quickly     Cognition   Area of Impairment Memory;Attention   Attention Comments alternating and divided attention can be challenging to pt at times.   Memory Comments Pt requires husband for details of situations.     Observation/Other Assessments   Standing Functional Reach Test 9 inches bilaterally   Other Surveys  Select   Physical Performance Test   Yes   Simulated Eating Time (seconds) 13.91 secs   Donning Doffing Jacket Time (seconds) 13.91 secs   Donning Doffing Jacket Comments 3 button/ unbutton:36.72 secs     Coordination   Fine Motor Movements are Fluid and Coordinated Yes   9 Hole Peg Test Right;Left   Right 9 Hole Peg Test 31.41 secs   Left 9 Hole Peg Test 30.32 secs     ROM / Strength   AROM / PROM / Strength AROM     AROM   Overall AROM  Deficits   Overall AROM Comments right shoulder flexion 120, elbow ext-20, left shoulder flexion 125, elbow ext -25                           OT Short Term Goals - 10/22/16 1238      OT SHORT TERM GOAL #1   Title I with PD specific HEP .- 11/20/16   Time 4   Period Weeks   Status New     OT SHORT TERM GOAL #2   Title Pt will demonstrate understanding of adapted strategies for ADLs/IADLs.   Time 4   Period Weeks   Status New     OT SHORT TERM GOAL #3   Title Pt will demonstrate ability to write a short paragraph with 100% legibility and only minimal decrease in letter size.   Time 4   Period Weeks   Status New     OT SHORT TERM GOAL #4   Title test box/ blocks and set goal PRN     OT SHORT TERM GOAL #5   Title Pt will increase right shoulder flexion to 125* with -15 elbow ext to retrieve a lightweight object.   Baseline sh flexion: 120, elbow ext -20   Time 4   Period Weeks   Status New           OT Long Term Goals - 10/22/16 1246      OT LONG TERM GOAL #1   Title  Pt will demonstrate improved safety for  ADLs as evidenced by increasing bilateral standing functional reach to 10 inches or greater.- due 10/19/16   Time 8   Period Weeks   Status New     OT LONG TERM GOAL #2   Title Pt will improve R dominant hand coordination for ADLs as shown by improving 9-hole peg test score by at least 3sec.    Baseline R 31.41 secs, LUE 30.32 secs   Time 8   Period Weeks   Status New     OT LONG TERM GOAL #3   Title Pt will demonstrate improved ease with fastening buttons as evidenced by decreasing 3 button/ unbutton to 32 secs or less.   Baseline 36.72 secs   Time 8   Period Weeks   Status New     OT LONG TERM GOAL #4   Title Pt will verbalize understanding of memory, visual compensation strategies for ADLs prn.   Time 8   Period Weeks   Status New     OT LONG TERM GOAL #5   Title --------------------------------------------------------------------------------------------------------               Plan - 10/22/16 1226    Clinical Impression Statement Pt with PMH significant for the following:Lumbar radiofrequency ablation (to treat lumbar facet joint pain)-last one 08/27/16 with relief of back pain; dementia due to Parkinson's; DVT (07/2016) and hx of breast CA, and Parkinson's disease, presents to occupational therapy with a decline in ADLs and functional mobility. Pt can benefit from skilled occupational therapy to address the following deficits: decreased balance, cognitive deficits, decreased coordination, decreased strength/ endurance, bradykinesia, in order to maximize pt's independence with ADLs/ IADLs and to maintain quality of life.   Rehab Potential Good   OT Frequency 2x / week   OT Duration 8 weeks   OT Treatment/Interventions Self-care/ADL training;DME and/or AE instruction;Splinting;Patient/family education;Balance training;Therapeutic exercises;Ultrasound;Therapeutic exercise;Therapeutic activities;Cognitive  remediation/compensation;Passive range of motion;Neuromuscular education;Cryotherapy;Electrical Stimulation;Parrafin;Energy conservation;Manual Therapy;Visual/perceptual remediation/compensation   Plan initiate HEP(PWR! seated or bag ex), adapted strategies for ADLs.   Consulted and Agree with Plan of Care Patient      Patient will benefit from skilled therapeutic intervention in order to improve the following deficits and impairments:  Abnormal gait, Decreased cognition, Impaired flexibility, Pain, Decreased mobility, Decreased coordination, Decreased activity tolerance, Decreased endurance, Decreased range of motion, Decreased strength, Impaired UE functional use, Difficulty walking, Decreased safety awareness, Decreased knowledge of precautions, Decreased balance  Visit Diagnosis: Abnormal posture - Plan: Ot plan of care cert/re-cert  Other abnormalities of gait and mobility - Plan: Ot plan of care cert/re-cert  Muscle weakness (generalized) - Plan: Ot plan of care cert/re-cert  Other lack of coordination - Plan: Ot plan of care cert/re-cert  Other symptoms and signs involving the nervous system - Plan: Ot plan of care cert/re-cert  Unsteadiness on feet - Plan: Ot plan of care cert/re-cert  Frontal lobe and executive function deficit - Plan: Ot plan of care cert/re-cert      G-Codes - 86/76/72 1252    Functional Assessment Tool Used (Outpatient only) 3 button/ unbutton: 36.72 secs, bilateral standing functional reach: 9 inches, 9 hole peg test RUE: 31.41 secs, LUE 30.32   Functional Limitation Self care   Self Care Current Status (C9470) At least 20 percent but less than 40 percent impaired, limited or restricted   Self Care Goal Status (J6283) At least 1 percent but less than 20 percent impaired, limited or restricted      Problem List Patient Active Problem List  Diagnosis Date Noted  . CAP (community acquired pneumonia) 04/13/2016  . PNA (pneumonia) 04/13/2016  . Murmur  09/02/2015  . Parkinson disease ([place]) 09/02/2015  . Bilateral carotid bruits 09/02/2015  . Confusion 08/07/2015  . Acute encephalopathy 08/07/2015  . UTI (lower urinary tract infection) 08/07/2015  . Acute kidney failure ([place]) 08/07/2015  . Paralysis agitans ([place]) 09/19/2012  . Lymphoma ([place]) 07/03/2011  . Malignant neoplasm of female breast ([place]) 07/03/2011  . Abnormal CT of the chest 03/13/2011  . HYPERTENSION, BENIGN 01/01/2009  . EDEMA 01/01/2009    [name] 10/22/2016, 12:56 PM [name], OTR/L [phone] Phone: [phone] 12:56 PM 03/29/18[facility] [address] [place], [name], [address] Phone: [phone]   Fax:  [phone]  Name: [name] MRN: [id number] Date of Birth: 03/20/1928

## 2016-10-26 ENCOUNTER — Encounter: Payer: Self-pay | Admitting: Physical Therapy

## 2016-10-26 ENCOUNTER — Ambulatory Visit: Payer: Medicare Other | Attending: Neurology | Admitting: Physical Therapy

## 2016-10-26 DIAGNOSIS — R471 Dysarthria and anarthria: Secondary | ICD-10-CM | POA: Insufficient documentation

## 2016-10-26 DIAGNOSIS — M6281 Muscle weakness (generalized): Secondary | ICD-10-CM | POA: Diagnosis not present

## 2016-10-26 DIAGNOSIS — R278 Other lack of coordination: Secondary | ICD-10-CM | POA: Diagnosis not present

## 2016-10-26 DIAGNOSIS — R2681 Unsteadiness on feet: Secondary | ICD-10-CM | POA: Insufficient documentation

## 2016-10-26 DIAGNOSIS — R2689 Other abnormalities of gait and mobility: Secondary | ICD-10-CM | POA: Diagnosis not present

## 2016-10-26 DIAGNOSIS — R29818 Other symptoms and signs involving the nervous system: Secondary | ICD-10-CM | POA: Insufficient documentation

## 2016-10-26 DIAGNOSIS — R41841 Cognitive communication deficit: Secondary | ICD-10-CM | POA: Insufficient documentation

## 2016-10-26 DIAGNOSIS — R293 Abnormal posture: Secondary | ICD-10-CM | POA: Diagnosis not present

## 2016-10-26 DIAGNOSIS — R41844 Frontal lobe and executive function deficit: Secondary | ICD-10-CM | POA: Insufficient documentation

## 2016-10-26 NOTE — Therapy (Signed)
[facility] [address] [place], [name], [address] Phone: [phone]   Fax:  [phone]  Physical Therapy Treatment  Patient Details  Name: [name] MRN: [id number] Date of Birth: [birth year] Referring Provider: Alonza Bogus 3 Encounter Date: 10/26/2016      PT End of Session - 10/26/16 1147    Visit Number 11   Number of Visits 18   Date for PT Re-Evaluation 11/07/16   Authorization Type Medicare primary, UHC secondary-GCODE every 10th visit   PT Start Time 1105   PT Stop Time 1143   PT Time Calculation (min) 38 min   Behavior During Therapy [facility] for tasks assessed/performed      Past Medical History:  Diagnosis Date  . Anxiety   . Asthma   . Depression   . Diabetes mellitus without complication ([place])    6/72/09.Marland KitchenMarland Kitchenpt denies  . GERD (gastroesophageal reflux disease)   . Hearing loss   . NHL (non-Hodgkin's lymphoma) ([place])    nhl dx 9/04 breast ca dx1/12  . Parkinson disease [facility])     Past Surgical History:  Procedure Laterality Date  . Ba-HA Ear implant    . MASTECTOMY  2 /8/ 12   bilateral  . MASTECTOMY      There were no vitals filed for this visit.      Subjective Assessment - 10/26/16 1106    Subjective Has been active over the weekend but didn't work on HEP due to being busy.   Currently in Pain? No/denies                         [facility] Adult PT Treatment/Exercise - 10/26/16 0001      Ambulation/Gait   Ambulation/Gait Yes   Ambulation/Gait Assistance 5: Supervision   Ambulation/Gait Assistance Details working on balance without AD working on changes in speed, direction, and head turns.   Ambulation Distance (Feet) 600 Feet   Assistive device None   Gait Pattern Step-through pattern;Decreased arm swing - left;Decreased step length - left;Narrow base of support   Ambulation Surface Level;Indoor             Balance Exercises - 10/26/16 1124      Balance Exercises: Standing   Standing, One Foot on a Step Eyes open;6 inch  Multidirectional tapping cones, min guard, no UE support   Stepping Strategy Anterior;Lateral  multidirectional stepping, progresing with weight shifts and   Sit to Stand Time x10 solid surface without UE support, x10 feet on compliant surface, min guard to min A.             PT Short Term Goals - 10/07/16 1112      PT SHORT TERM GOAL #1   Title Pt will perform HEP with family supervision, for improved balance, transfers, gait.  TARGET 10/07/16   Time 4   Period Weeks   Status Achieved     PT SHORT TERM GOAL #2   Title Pt will improve 5x sit<>stand to less than or equal to 20 seconds for improved transfer efficiency and safety.   Baseline 13.22 sec on 10/07/16   Time 4   Period Weeks   Status Achieved     PT SHORT TERM GOAL #3   Title Berg Balance test to be assessed, with pt to improve by at least 5 points, for decreased fall risk.   Baseline Berg 39/56 on 09/25/16, with goal:  44/56; 10/07/16:  Berg score 46/56  Time 4   Period Weeks   Status Achieved     PT SHORT TERM GOAL #4   Title Pt will improve TUG score to less than or equal to 18 seconds for decreased fall risk.   Baseline 14.29 sec on 10/07/16 (no cane)   Time 4   Period Weeks   Status Achieved           PT Long Term Goals - 09/08/16 1416      PT LONG TERM GOAL #1   Title Pt/family will verbalize understanding of fall prevention in home environment.  TARGET 11/07/16   Time 9   Period Weeks   Status New     PT LONG TERM GOAL #2   Title Pt will improve 5x sit<>stand to less than or equal to 15 seconds for improved ease and efficiency with transfers.   Time 9   Period Weeks   Status New     PT LONG TERM GOAL #3   Title Pt will improve TUG score to less than or equal to 13.5 seconds for decreased fall risk.   Time 9   Period Weeks   Status New     PT LONG TERM GOAL #4   Title Pt will improve gait velocity ot at least 1.8  ft/sec for improved gait efficiency and safety.     Time 9   Period Weeks   Status New               Plan - 10/26/16 1148    Clinical Impression Statement Pt was challenged by the sit<>stand acitivty with feet on compliant surface requiring min guard to min A.  Pt performed dynamic gait without AD at supervision level self correcting imbalance but slowing speed down with head turns and changes with  types of gait ( backward, marching. ect.) Pt performed SLS, and standing balance with narrow BOS on solid surface at min guard level.                                          Rehab Potential Good   PT Frequency 2x / week  1x/1wk, then    PT Duration 8 weeks  plus eval   PT Treatment/Interventions ADLs/Self Care Home Management;Functional mobility training;Gait training;DME Instruction;Therapeutic activities;Therapeutic exercise;Balance training;Neuromuscular re-education;Patient/family education   PT Next Visit Plan  continue dynamic balance/compliant surface and gait training   PT Home Exercise Plan OTAGO strengthening exercises added 09/16/16, 09/25/16, 10/08/16   Consulted and Agree with Plan of Care Patient;Family member/caregiver   Family Member Consulted husband--[name]      Patient will benefit from skilled therapeutic intervention in order to improve the following deficits and impairments:  Abnormal gait, Decreased balance, Decreased mobility, Decreased strength, Difficulty walking, Postural dysfunction, Decreased endurance  Visit Diagnosis: Other abnormalities of gait and mobility  Muscle weakness (generalized)  Unsteadiness on feet  Abnormal posture     Problem List Patient Active Problem List   Diagnosis Date Noted  . CAP (community acquired pneumonia) 04/13/2016  . PNA (pneumonia) 04/13/2016  . Murmur 09/02/2015  . Parkinson disease ([place]) 09/02/2015  . Bilateral carotid bruits 09/02/2015  . Confusion 08/07/2015  . Acute encephalopathy 08/07/2015  . UTI (lower  urinary tract infection) 08/07/2015  . Acute kidney failure ([place]) 08/07/2015  . Paralysis agitans ([place]) 09/19/2012  . Lymphoma ([place]) 07/03/2011  . Malignant neoplasm of female breast ([place])  07/03/2011  . Abnormal CT of the chest 03/13/2011  . HYPERTENSION, BENIGN 01/01/2009  . EDEMA 01/01/2009    Bjorn Loser, PTA  10/26/16, 11:53 AM [facility] [address] [place], [name], [address] Phone: [phone]   Fax:  [phone]  Name: Tynasia Mccaul MRN: [id number] Date of Birth: 1928/04/18

## 2016-10-28 ENCOUNTER — Ambulatory Visit: Payer: Medicare Other | Admitting: Physical Therapy

## 2016-10-28 ENCOUNTER — Ambulatory Visit: Payer: Medicare Other | Admitting: Occupational Therapy

## 2016-10-28 ENCOUNTER — Encounter: Payer: Self-pay | Admitting: Physical Therapy

## 2016-10-28 DIAGNOSIS — R2681 Unsteadiness on feet: Secondary | ICD-10-CM | POA: Diagnosis not present

## 2016-10-28 DIAGNOSIS — M6281 Muscle weakness (generalized): Secondary | ICD-10-CM

## 2016-10-28 DIAGNOSIS — R278 Other lack of coordination: Secondary | ICD-10-CM

## 2016-10-28 DIAGNOSIS — R2689 Other abnormalities of gait and mobility: Secondary | ICD-10-CM

## 2016-10-28 DIAGNOSIS — R29818 Other symptoms and signs involving the nervous system: Secondary | ICD-10-CM

## 2016-10-28 DIAGNOSIS — R293 Abnormal posture: Secondary | ICD-10-CM

## 2016-10-28 DIAGNOSIS — R41844 Frontal lobe and executive function deficit: Secondary | ICD-10-CM

## 2016-10-28 NOTE — Therapy (Signed)
[facility] [address] [place], [name], [address] Phone: [phone]   Fax:  [phone]  Physical Therapy Treatment  Patient Details  Name: [name] MRN: [id number] Date of Birth: January 06, 1928 Referring Provider: Alonza Bogus  Encounter Date: 10/28/2016      PT End of Session - 10/28/16 1145    Visit Number 12   Number of Visits 18   Date for PT Re-Evaluation 11/07/16   Authorization Type Medicare primary, UHC secondary-GCODE every 10th visit   PT Start Time 1105   PT Stop Time 1143   PT Time Calculation (min) 38 min   Behavior During Therapy [facility] for tasks assessed/performed      Past Medical History:  Diagnosis Date  . Anxiety   . Asthma   . Depression   . Diabetes mellitus without complication ([place])    3/71/06.Marland KitchenMarland Kitchenpt denies  . GERD (gastroesophageal reflux disease)   . Hearing loss   . NHL (non-Hodgkin's lymphoma) ([place])    nhl dx 9/04 breast ca dx1/12  . Parkinson disease [facility])     Past Surgical History:  Procedure Laterality Date  . Ba-HA Ear implant    . MASTECTOMY  2 /8/ 12   bilateral  . MASTECTOMY      There were no vitals filed for this visit.      Subjective Assessment - 10/28/16 1106    Subjective Yesterday was busy and did not do HEP.   Currently in Pain? Yes   Pain Score 5    Pain Location Back   Pain Orientation Right   Pain Descriptors / Indicators Sore   Pain Type Chronic pain   Pain Onset More than a month ago   Pain Frequency Intermittent                              Balance Exercises - 10/28/16 1157      Balance Exercises: Standing   Standing Eyes Opened Wide (BOA);Foam/compliant surface;Head turns  + lateral hipo sway, marching in place, intermittent UE supp   Standing, One Foot on a Step Eyes open;6 inch  Amb forward alt tapping cones, progressing with upright gaze   Other Standing Exercises Narrow BOS: hip movement forward/backward,  head turns, intermittent UE support     Exercises for [place]: sidestepping without UE support; walking backwards with UE support the progressing without support.      PT Education - 10/28/16 1135    Education provided Yes   Education Details Verbally reviewed HEP due to pt not performing consistently lately.  Pt has the exercises and states she tries to do them most days but has been busy. Pt has no questions.   Person(s) Educated Patient   Methods Explanation   Comprehension Verbalized understanding          PT Short Term Goals - 10/07/16 1112      PT SHORT TERM GOAL #1   Title Pt will perform HEP with family supervision, for improved balance, transfers, gait.  TARGET 10/07/16   Time 4   Period Weeks   Status Achieved     PT SHORT TERM GOAL #2   Title Pt will improve 5x sit<>stand to less than or equal to 20 seconds for improved transfer efficiency and safety.   Baseline 13.22 sec on 10/07/16   Time 4   Period Weeks   Status Achieved     PT SHORT TERM GOAL #3  Title [employer] test to be assessed, with pt to improve by at least 5 points, for decreased fall risk.   Baseline Berg 39/56 on 09/25/16, with goal:  44/56; 10/07/16:  Merrilee Jansky score 46/56   Time 4   Period Weeks   Status Achieved     PT SHORT TERM GOAL #4   Title Pt will improve TUG score to less than or equal to 18 seconds for decreased fall risk.   Baseline 14.29 sec on 10/07/16 (no cane)   Time 4   Period Weeks   Status Achieved           PT Long Term Goals - 09/08/16 1416      PT LONG TERM GOAL #1   Title Pt/family will verbalize understanding of fall prevention in home environment.  TARGET 11/07/16   Time 9   Period Weeks   Status New     PT LONG TERM GOAL #2   Title Pt will improve 5x sit<>stand to less than or equal to 15 seconds for improved ease and efficiency with transfers.   Time 9   Period Weeks   Status New     PT LONG TERM GOAL #3   Title Pt will improve TUG score to less than or  equal to 13.5 seconds for decreased fall risk.   Time 9   Period Weeks   Status New     PT LONG TERM GOAL #4   Title Pt will improve gait velocity ot at least 1.8 ft/sec for improved gait efficiency and safety.     Time 9   Period Weeks   Status New               Plan - 10/28/16 1150    Clinical Impression Statement Pt required intermittent UE support with standing balance on compliant surface with head turns with feet apart and more support with feet together. Verbally reviewed and performed a couple of exercises from HEP due to  pt not performing consistently lately.  Pt has the exercises and states she tries to do them most days but has been busy. Pt has no questions.   Rehab Potential Good   PT Frequency 2x / week  1x/1wk, then    PT Duration 8 weeks  plus eval   PT Treatment/Interventions ADLs/Self Care Home Management;Functional mobility training;Gait training;DME Instruction;Therapeutic activities;Therapeutic exercise;Balance training;Neuromuscular re-education;Patient/family education   PT Next Visit Plan  continue dynamic balance/compliant surface and gait training   PT Home Exercise Plan OTAGO strengthening exercises added 09/16/16, 09/25/16, 10/08/16   Consulted and Agree with Plan of Care Patient;Family member/caregiver   Family Member Consulted husband--[name]      Patient will benefit from skilled therapeutic intervention in order to improve the following deficits and impairments:  Abnormal gait, Decreased balance, Decreased mobility, Decreased strength, Difficulty walking, Postural dysfunction, Decreased endurance  Visit Diagnosis: Other abnormalities of gait and mobility  Muscle weakness (generalized)  Unsteadiness on feet  Abnormal posture     Problem List Patient Active Problem List   Diagnosis Date Noted  . CAP (community acquired pneumonia) 04/13/2016  . PNA (pneumonia) 04/13/2016  . Murmur 09/02/2015  . Parkinson disease ([place]) 09/02/2015  .  Bilateral carotid bruits 09/02/2015  . Confusion 08/07/2015  . Acute encephalopathy 08/07/2015  . UTI (lower urinary tract infection) 08/07/2015  . Acute kidney failure ([place]) 08/07/2015  . Paralysis agitans ([place]) 09/19/2012  . Lymphoma ([place]) 07/03/2011  . Malignant neoplasm of female breast ([place]) 07/03/2011  .  Abnormal CT of the chest 03/13/2011  . HYPERTENSION, BENIGN 01/01/2009  . EDEMA 01/01/2009    Bjorn Loser, PTA  10/28/16, 12:02 PM [facility] [address] [place], [name], [address] Phone: [phone]   Fax:  [phone]  Name: Basia Mcginty MRN: [id number] Date of Birth: [birth year]

## 2016-10-28 NOTE — Therapy (Signed)
[facility] [address] [place], [name], [address] Phone: [phone]   Fax:  [phone]  Occupational Therapy Treatment  Patient Details  Name: [name] MRN: [id number] Date of Birth: May 21, 1928 Referring Provider: Dr. [name]  Encounter Date: 10/28/2016      OT End of Session - 10/28/16 1046    Visit Number 2   Number of Visits 17   Authorization Type MCR, UHC secondary   Authorization - Visit Number 2   Authorization - Number of Visits 10   OT Start Time 1020   OT Stop Time 1100   OT Time Calculation (min) 40 min   Activity Tolerance Patient tolerated treatment well   Behavior During Therapy [facility] for tasks assessed/performed      Past Medical History:  Diagnosis Date  . Anxiety   . Asthma   . Depression   . Diabetes mellitus without complication ([place])    3/41/93.Marland KitchenMarland Kitchenpt denies  . GERD (gastroesophageal reflux disease)   . Hearing loss   . NHL (non-Hodgkin's lymphoma) ([place])    nhl dx 9/04 breast ca dx1/12  . Parkinson disease [facility])     Past Surgical History:  Procedure Laterality Date  . Ba-HA Ear implant    . MASTECTOMY  2 /8/ 12   bilateral  . MASTECTOMY      There were no vitals filed for this visit.      Subjective Assessment - 10/28/16 1024    Subjective  Pt reports slight arm pain in right arm   Pertinent History Lumbar radiofrequency ablation (to treat lumbar facet joint pain)-last one 08/27/16 with relief of back pain; dementia due to Parkinson's; DVT (07/2016)   Patient Stated Goals improve independence with daily activities   Pain Score 4    Pain Location Arm   Pain Orientation Right   Pain Descriptors / Indicators Aching   Pain Type Chronic pain   Pain Onset More than a month ago   Pain Frequency Constant   Aggravating Factors  stiff in mornings   Pain Relieving Factors rest         Treatment:PWR! Seated for PWR1 up, rock and twist, 10 reps each, mod v.c. And  demonstration Bag exercises for simulated ADLs: simulating donning shirt, tucking in shirt and pulling up socks. Back pain when reaching to simulate donning pants, and therefore pt. was instructed to perform donning pants with ankle crossed across knee. Handwriting exercises including continuous "l"  With mod v.c. And  Improving performance with practice. Pt was issued a tripod grip for her pen at home.                      OT Short Term Goals - 10/22/16 1238      OT SHORT TERM GOAL #1   Title I with PD specific HEP .- 11/20/16   Time 4   Period Weeks   Status New     OT SHORT TERM GOAL #2   Title Pt will demonstrate understanding of adapted strategies for ADLs/IADLs.   Time 4   Period Weeks   Status New     OT SHORT TERM GOAL #3   Title Pt will demonstrate ability to write a short paragraph with 100% legibility and only minimal decrease in letter size.   Time 4   Period Weeks   Status New     OT SHORT TERM GOAL #4   Title test box/ blocks and set goal PRN  OT SHORT TERM GOAL #5   Title Pt will increase right shoulder flexion to 125* with -15 elbow ext to retrieve a lightweight object.   Baseline sh flexion: 120, elbow ext -20   Time 4   Period Weeks   Status New           OT Long Term Goals - 10/22/16 1246      OT LONG TERM GOAL #1   Title Pt will demonstrate improved safety for ADLs as evidenced by increasing bilateral standing functional reach to 10 inches or greater.- due 10/19/16   Time 8   Period Weeks   Status New     OT LONG TERM GOAL #2   Title Pt will improve R dominant hand coordination for ADLs as shown by improving 9-hole peg test score by at least 3sec.    Baseline R 31.41 secs, LUE 30.32 secs   Time 8   Period Weeks   Status New     OT LONG TERM GOAL #3   Title Pt will demonstrate improved ease with fastening buttons as evidenced by decreasing 3 button/ unbutton to 32 secs or less.   Baseline 36.72 secs   Time 8   Period  Weeks   Status New     OT LONG TERM GOAL #4   Title Pt will verbalize understanding of memory, visual compensation strategies for ADLs prn.   Time 8   Period Weeks   Status New     OT LONG TERM GOAL #5   Title --------------------------------------------------------------------------------------------------------               Plan - 10/28/16 1044    Clinical Impression Statement Pt is progressing towards goals. She can benefit from repetition of big movments for ADLs.   Rehab Potential Good   OT Frequency 2x / week   OT Duration 8 weeks   OT Treatment/Interventions Self-care/ADL training;DME and/or AE instruction;Splinting;Patient/family education;Balance training;Therapeutic exercises;Ultrasound;Therapeutic exercise;Therapeutic activities;Cognitive remediation/compensation;Passive range of motion;Neuromuscular education;Cryotherapy;Electrical Stimulation;[place]rafin;Energy conservation;Manual Therapy;Visual/perceptual remediation/compensation   Plan issue HEP, bag vs PWR! seated   Consulted and Agree with Plan of Care Patient      Patient will benefit from skilled therapeutic intervention in order to improve the following deficits and impairments:  Abnormal gait, Decreased cognition, Impaired flexibility, Pain, Decreased mobility, Decreased coordination, Decreased activity tolerance, Decreased endurance, Decreased range of motion, Decreased strength, Impaired UE functional use, Difficulty walking, Decreased safety awareness, Decreased knowledge of precautions, Decreased balance  Visit Diagnosis: No diagnosis found.    Problem List Patient Active Problem List   Diagnosis Date Noted  . CAP (community acquired pneumonia) 04/13/2016  . PNA (pneumonia) 04/13/2016  . Murmur 09/02/2015  . Parkinson disease ([place]) 09/02/2015  . Bilateral carotid bruits 09/02/2015  . Confusion 08/07/2015  . Acute encephalopathy 08/07/2015  . UTI (lower urinary tract infection) 08/07/2015  .  Acute kidney failure ([place]) 08/07/2015  . Paralysis agitans ([place]) 09/19/2012  . Lymphoma ([place]) 07/03/2011  . Malignant neoplasm of female breast ([place]) 07/03/2011  . Abnormal CT of the chest 03/13/2011  . HYPERTENSION, BENIGN 01/01/2009  . EDEMA 01/01/2009    Tajai Ihde 10/28/2016, 10:47 AM  [facility] [address] [place], [name], [address] Phone: [phone]   Fax:  [phone]  Name: Lorynn Moeser MRN: [id number] Date of Birth: 1928/04/28

## 2016-11-03 ENCOUNTER — Ambulatory Visit: Payer: Medicare Other | Admitting: Physical Therapy

## 2016-11-03 DIAGNOSIS — R2689 Other abnormalities of gait and mobility: Secondary | ICD-10-CM | POA: Diagnosis not present

## 2016-11-03 DIAGNOSIS — R41844 Frontal lobe and executive function deficit: Secondary | ICD-10-CM | POA: Diagnosis not present

## 2016-11-03 DIAGNOSIS — R29818 Other symptoms and signs involving the nervous system: Secondary | ICD-10-CM | POA: Diagnosis not present

## 2016-11-03 DIAGNOSIS — R293 Abnormal posture: Secondary | ICD-10-CM | POA: Diagnosis not present

## 2016-11-03 DIAGNOSIS — R2681 Unsteadiness on feet: Secondary | ICD-10-CM | POA: Diagnosis not present

## 2016-11-03 DIAGNOSIS — M6281 Muscle weakness (generalized): Secondary | ICD-10-CM | POA: Diagnosis not present

## 2016-11-03 NOTE — Therapy (Signed)
[facility] [address] [place], [name], [address] Phone: [phone]   Fax:  [phone]  Physical Therapy Treatment  Patient Details  Name: [name] MRN: [id number] Date of Birth: 02-08-1928 Referring Provider: Alonza Bogus  Encounter Date: 11/03/2016      PT End of Session - 11/03/16 2206    Visit Number 13   Number of Visits 18   Date for PT Re-Evaluation 11/07/16   Authorization Type Medicare primary, UHC secondary-GCODE every 10th visit   PT Start Time 1150   PT Stop Time 1233   PT Time Calculation (min) 43 min   Activity Tolerance Patient tolerated treatment well   Behavior During Therapy [facility] for tasks assessed/performed      Past Medical History:  Diagnosis Date  . Anxiety   . Asthma   . Depression   . Diabetes mellitus without complication ([place])    11/17/51.Marland KitchenMarland Kitchenpt denies  . GERD (gastroesophageal reflux disease)   . Hearing loss   . NHL (non-Hodgkin's lymphoma) ([place])    nhl dx 9/04 breast ca dx1/12  . Parkinson disease [facility])     Past Surgical History:  Procedure Laterality Date  . Ba-HA Ear implant    . MASTECTOMY  2 /8/ 12   bilateral  . MASTECTOMY      There were no vitals filed for this visit.      Subjective Assessment - 11/03/16 1154    Subjective [place] the zoo (used walker[place] and walked really long distances.  Was tired and sore, but doing okay today.  Trying not to use the cane as much.  No falls.   Pertinent History Lumbar radiofrequency ablation (to treat lumbar facet joint pain)-last one 08/27/16 with relief of back pain; dementia due to Parkinson's; DVT (07/2016 with aspirin x 10 days treatment)   Limitations Lifting;Standing;Walking   Patient Stated Goals Pt's goals for therapy are to improve balance and get away from cane.   Currently in Pain? No/denies   Pain Onset More than a month ago                         [facility] Adult PT Treatment/Exercise -  11/03/16 0001      Ambulation/Gait   Ambulation/Gait Yes   Ambulation/Gait Assistance 5: Supervision   Ambulation/Gait Assistance Details Gait training activities with no assistive device.   Ambulation Distance (Feet) 230 Feet  then 440 ft envrionmental scanning tasks   Assistive device None   Gait Pattern Step-through pattern;Decreased arm swing - left;Decreased step length - left;Narrow base of support   Ambulation Surface Level;Indoor   Pre-Gait Activities Gait with starts/stops/change of direction into posterior gait during environmental scanning tasks.   Gait Comments Provided close supervision for environmental scanning tasks without use of cane, but pt does not experience any LOB.       Self-Care   Self-Care Other Self-Care Comments   Other Self-Care Comments  At end of session, discussed with patient and wife POC-plans to check LTGs and likely plan for discharge next visit.  pt and husband in agreement.  Discussed improtance of continued HEP and transition to exercise room at church with walking activities.     Discussed pt using best judgement for use of appropriate assistive device-rollator walker for longer distances, cane for typical outdoor distances and community gait, cane when fatigued or having increased back pain, no cane for short distances in familiar areas.  Balance Exercises - 11/03/16 1203      Balance Exercises: Standing   Standing Eyes Opened Wide (BOA);Narrow base of support (BOS);Foam/compliant surface;Head turns  Head nods x 5 reps each, UE support   Standing Eyes Closed Wide (BOA);Narrow base of support (BOS);Foam/compliant surface;1 rep;10 secs  UE support at chair   Stepping Strategy Anterior;Lateral;Foam/compliant surface;UE support  Cues for increased weightshift with stepping activity   Heel Raises Limitations x 10 reps on foam, UE support   Toe Raise Limitations x 10 reps on foam, UE support   Other Standing Exercises On compliant red mat  surface:  marching in place x 10, forward kicks x 10, forward step taps x 10 reps, then forward/back walking, marching forward with turns, 3 reps with intermittent UE support             PT Short Term Goals - 10/07/16 1112      PT SHORT TERM GOAL #1   Title Pt will perform HEP with family supervision, for improved balance, transfers, gait.  TARGET 10/07/16   Time 4   Period Weeks   Status Achieved     PT SHORT TERM GOAL #2   Title Pt will improve 5x sit<>stand to less than or equal to 20 seconds for improved transfer efficiency and safety.   Baseline 13.22 sec on 10/07/16   Time 4   Period Weeks   Status Achieved     PT SHORT TERM GOAL #3   Title Berg Balance test to be assessed, with pt to improve by at least 5 points, for decreased fall risk.   Baseline Berg 39/56 on 09/25/16, with goal:  44/56; 10/07/16:  Merrilee Jansky score 46/56   Time 4   Period Weeks   Status Achieved     PT SHORT TERM GOAL #4   Title Pt will improve TUG score to less than or equal to 18 seconds for decreased fall risk.   Baseline 14.29 sec on 10/07/16 (no cane)   Time 4   Period Weeks   Status Achieved           PT Long Term Goals - 09/08/16 1416      PT LONG TERM GOAL #1   Title Pt/family will verbalize understanding of fall prevention in home environment.  TARGET 11/07/16   Time 9   Period Weeks   Status New     PT LONG TERM GOAL #2   Title Pt will improve 5x sit<>stand to less than or equal to 15 seconds for improved ease and efficiency with transfers.   Time 9   Period Weeks   Status New     PT LONG TERM GOAL #3   Title Pt will improve TUG score to less than or equal to 13.5 seconds for decreased fall risk.   Time 9   Period Weeks   Status New     PT LONG TERM GOAL #4   Title Pt will improve gait velocity ot at least 1.8 ft/sec for improved gait efficiency and safety.     Time 9   Period Weeks   Status New               Plan - 11/03/16 2207    Clinical Impression Statement Pt  challenged with compliant surface activities, needing intermittent>frequent UE support for balance.  Gait training activities without cane with no LOB.  Discussed importance of use of appropriate assistive device as needed.  Will likely plan to discharge next visit.  Rehab Potential Good   PT Frequency 2x / week  1x/1wk, then    PT Duration 8 weeks  plus eval   PT Treatment/Interventions ADLs/Self Care Home Management;Functional mobility training;Gait training;DME Instruction;Therapeutic activities;Therapeutic exercise;Balance training;Neuromuscular re-education;Patient/family education   PT Next Visit Plan Check LTGs and plan for discharge next visit.   PT Home Exercise Plan OTAGO strengthening exercises added 09/16/16, 09/25/16, 10/08/16   Consulted and Agree with Plan of Care Patient;Family member/caregiver   Family Member Consulted husband--[name]      Patient will benefit from skilled therapeutic intervention in order to improve the following deficits and impairments:  Abnormal gait, Decreased balance, Decreased mobility, Decreased strength, Difficulty walking, Postural dysfunction, Decreased endurance  Visit Diagnosis: Other abnormalities of gait and mobility  Unsteadiness on feet     Problem List Patient Active Problem List   Diagnosis Date Noted  . CAP (community acquired pneumonia) 04/13/2016  . PNA (pneumonia) 04/13/2016  . Murmur 09/02/2015  . Parkinson disease ([place]) 09/02/2015  . Bilateral carotid bruits 09/02/2015  . Confusion 08/07/2015  . Acute encephalopathy 08/07/2015  . UTI (lower urinary tract infection) 08/07/2015  . Acute kidney failure ([place]) 08/07/2015  . Paralysis agitans ([place]) 09/19/2012  . Lymphoma ([place]) 07/03/2011  . Malignant neoplasm of female breast ([place]) 07/03/2011  . Abnormal CT of the chest 03/13/2011  . HYPERTENSION, BENIGN 01/01/2009  . EDEMA 01/01/2009    Vietta Bonifield W. 11/03/2016, 10:15 PM  Frazier Butt., PT  [facility] [address] [place], [name], [address] Phone: [phone]   Fax:  [phone]  Name: [name] MRN: [id number] Date of Birth: [birth year]

## 2016-11-05 ENCOUNTER — Ambulatory Visit: Payer: Medicare Other | Admitting: Physical Therapy

## 2016-11-05 DIAGNOSIS — R2681 Unsteadiness on feet: Secondary | ICD-10-CM | POA: Diagnosis not present

## 2016-11-05 DIAGNOSIS — M6281 Muscle weakness (generalized): Secondary | ICD-10-CM | POA: Diagnosis not present

## 2016-11-05 DIAGNOSIS — R29818 Other symptoms and signs involving the nervous system: Secondary | ICD-10-CM

## 2016-11-05 DIAGNOSIS — R2689 Other abnormalities of gait and mobility: Secondary | ICD-10-CM

## 2016-11-05 DIAGNOSIS — R293 Abnormal posture: Secondary | ICD-10-CM | POA: Diagnosis not present

## 2016-11-05 DIAGNOSIS — R41844 Frontal lobe and executive function deficit: Secondary | ICD-10-CM | POA: Diagnosis not present

## 2016-11-05 NOTE — Patient Instructions (Signed)

## 2016-11-06 NOTE — Therapy (Signed)
[facility] [address] [place], [name], [address] Phone: [phone]   Fax:  [phone]  Physical Therapy Treatment  Patient Details  Name: [name] MRN: [id number] Date of Birth: 1928/02/13 Referring Provider: Alonza Bogus  Encounter Date: 11/05/2016      PT End of Session - 11/06/16 1328    Visit Number 14   Number of Visits 18   Date for PT Re-Evaluation 11/07/16   Authorization Type Medicare primary, UHC secondary-GCODE every 10th visit   PT Start Time 1025   PT Stop Time 1104   PT Time Calculation (min) 39 min   Activity Tolerance Patient tolerated treatment well   Behavior During Therapy [facility] for tasks assessed/performed      Past Medical History:  Diagnosis Date  . Anxiety   . Asthma   . Depression   . Diabetes mellitus without complication ([place])    2/77/82.Marland KitchenMarland Kitchenpt denies  . GERD (gastroesophageal reflux disease)   . Hearing loss   . NHL (non-Hodgkin's lymphoma) ([place])    nhl dx 9/04 breast ca dx1/12  . Parkinson disease [facility])     Past Surgical History:  Procedure Laterality Date  . Ba-HA Ear implant    . MASTECTOMY  2 /8/ 12   bilateral  . MASTECTOMY      There were no vitals filed for this visit.      Subjective Assessment - 11/05/16 1028    Subjective Had to take a tylenol earlier due to back pain.     Pertinent History Lumbar radiofrequency ablation (to treat lumbar facet joint pain)-last one 08/27/16 with relief of back pain; dementia due to Parkinson's; DVT (07/2016 with aspirin x 10 days treatment)   Limitations Lifting;Standing;Walking   Patient Stated Goals Pt's goals for therapy are to improve balance and get away from cane.   Currently in Pain? No/denies   Pain Onset More than a month ago                         [facility] Adult PT Treatment/Exercise - 11/05/16 1030      Transfers   Transfers Sit to Stand;Stand to Sit   Sit to Stand 6: Modified  independent (Device/Increase time);Without upper extremity assist;From chair/3-in-1   Five time sit to stand comments  14.69  15.56 sec second trial   Stand to Sit Without upper extremity assist;To chair/3-in-1     Ambulation/Gait   Ambulation/Gait Yes   Ambulation/Gait Assistance 5: Supervision   Assistive device Straight cane;None   Gait Pattern Step-through pattern;Decreased arm swing - left;Decreased step length - left;Narrow base of support   Ambulation Surface Level;Indoor   Gait velocity 12.12 sec with cane = 2.71 ft/sec  no cane 12.03 sec = 2.73 ft/sec     Berg Balance Test   Sit to Stand Able to stand without using hands and stabilize independently   Standing Unsupported Able to stand safely 2 minutes   Sitting with Back Unsupported but Feet Supported on Floor or Stool Able to sit safely and securely 2 minutes   Stand to Sit Sits safely with minimal use of hands   Transfers Able to transfer safely, minor use of hands   Standing Unsupported with Eyes Closed Able to stand 10 seconds safely   Standing Ubsupported with Feet Together Able to place feet together independently and stand 1 minute safely   From Standing, Reach Forward with Outstretched Arm Can reach confidently >25 cm (10")  From Standing Position, Pick up Object from [place] to pick up shoe safely and easily   From Standing Position, Turn to Look Behind Over each Shoulder Looks behind from both sides and weight shifts well   Turn 360 Degrees Able to turn 360 degrees safely but slowly   Standing Unsupported, Alternately Place Feet on Step/Stool Able to complete 4 steps without aid or supervision   Standing Unsupported, One Foot in [place] to plae foot ahead of the other independently and hold 30 seconds   Standing on One Leg Tries to lift leg/unable to hold 3 seconds but remains standing independently   Total Score 48     Timed Up and Go Test   TUG Normal TUG   Normal TUG (seconds) 12.35  12.28 sec no device      Self-Care   Self-Care Other Self-Care Comments   Other Self-Care Comments  Discussed fall prevention education, handout provided  Discussed progress towards goals, plans for d/c                PT Education - 11/06/16 1327    Education provided Yes   Education Details Fall prevention education, progress towards goals, plans for d/c   Person(s) Educated Patient;Spouse   Methods Explanation;Handout   Comprehension Verbalized understanding          PT Short Term Goals - 10/07/16 1112      PT SHORT TERM GOAL #1   Title Pt will perform HEP with family supervision, for improved balance, transfers, gait.  TARGET 10/07/16   Time 4   Period Weeks   Status Achieved     PT SHORT TERM GOAL #2   Title Pt will improve 5x sit<>stand to less than or equal to 20 seconds for improved transfer efficiency and safety.   Baseline 13.22 sec on 10/07/16   Time 4   Period Weeks   Status Achieved     PT SHORT TERM GOAL #3   Title Berg Balance test to be assessed, with pt to improve by at least 5 points, for decreased fall risk.   Baseline Berg 39/56 on 09/25/16, with goal:  44/56; 10/07/16:  Merrilee Jansky score 46/56   Time 4   Period Weeks   Status Achieved     PT SHORT TERM GOAL #4   Title Pt will improve TUG score to less than or equal to 18 seconds for decreased fall risk.   Baseline 14.29 sec on 10/07/16 (no cane)   Time 4   Period Weeks   Status Achieved           PT Long Term Goals - 11/06/16 1329      PT LONG TERM GOAL #1   Title Pt/family will verbalize understanding of fall prevention in home environment.  TARGET 11/07/16   Time 9   Period Weeks   Status Achieved     PT LONG TERM GOAL #2   Title Pt will improve 5x sit<>stand to less than or equal to 15 seconds for improved ease and efficiency with transfers.   Baseline 14.69 sec 11/05/16   Time 9   Period Weeks   Status Achieved     PT LONG TERM GOAL #3   Title Pt will improve TUG score to less than or equal to 13.5  seconds for decreased fall risk.   Baseline 12.35 sec 11/05/16   Time 9   Period Weeks   Status Achieved     PT LONG TERM GOAL #4  Title Pt will improve gait velocity ot at least 1.8 ft/sec for improved gait efficiency and safety.     Baseline 2.71 ft/sec 12-01-2016   Time 9   Period Weeks   Status Achieved               Plan - 11/06/16 1330    Clinical Impression Statement Spent time today assessing LTGs and preparing for discharge.  Pt has made significant mobility improvements and is at decreased fall risk per Berg, gait velocity and TUG scores.  Pt has met all LTGs.  Pt is appropriate for discharge from PT at this time.   Rehab Potential Good   PT Frequency 2x / week  1x/1wk, then    PT Duration 8 weeks  plus eval   PT Treatment/Interventions ADLs/Self Care Home Management;Functional mobility training;Gait training;DME Instruction;Therapeutic activities;Therapeutic exercise;Balance training;Neuromuscular re-education;Patient/family education   PT Next Visit Plan Discharge this visit.  Return screen in 6 months.   PT Home Exercise Plan OTAGO strengthening exercises added 09/16/16, 09/25/16, 10/08/16   Consulted and Agree with Plan of Care Patient;Family member/caregiver   Family Member Consulted husband--[name]      Patient will benefit from skilled therapeutic intervention in order to improve the following deficits and impairments:  Abnormal gait, Decreased balance, Decreased mobility, Decreased strength, Difficulty walking, Postural dysfunction, Decreased endurance  Visit Diagnosis: Other abnormalities of gait and mobility  Unsteadiness on feet  Other symptoms and signs involving the nervous system       G-Codes - 12-01-2016 1332    Functional Assessment Tool Used (Outpatient Only) TUG 12.35 sec, 5x sit<>stand 14.69 sec, gait velocity 2.71 ft/sec, Berg 48/56   Functional Limitation Mobility: Walking and moving around   Mobility: Walking and Moving Around Goal Status  [phone]) At least 20 percent but less than 40 percent impaired, limited or restricted   Mobility: Walking and Moving Around Discharge Status [id number]) At least 20 percent but less than 40 percent impaired, limited or restricted      Problem List Patient Active Problem List   Diagnosis Date Noted  . CAP (community acquired pneumonia) 04/13/2016  . PNA (pneumonia) 04/13/2016  . Murmur 09/02/2015  . Parkinson disease ([place]) 09/02/2015  . Bilateral carotid bruits 09/02/2015  . Confusion 08/07/2015  . Acute encephalopathy 08/07/2015  . UTI (lower urinary tract infection) 08/07/2015  . Acute kidney failure ([place]) 08/07/2015  . Paralysis agitans ([place]) 09/19/2012  . Lymphoma ([place]) 07/03/2011  . Malignant neoplasm of female breast ([place]) 07/03/2011  . Abnormal CT of the chest 03/13/2011  . HYPERTENSION, BENIGN 01/01/2009  . EDEMA 01/01/2009    Coe Angelos W. 11/06/2016, 1:34 PM  Frazier Butt., PT  [facility] [address] [place], [name], [address] Phone: [phone]   Fax:  [phone]  Name: [name] MRN: [id number] Date of Birth: 1928/06/10   PHYSICAL THERAPY DISCHARGE SUMMARY  Visits from Start of Care: 14  Current functional level related to goals / functional outcomes: See LTGs-pt has met all LTGs   Remaining deficits: Balance, fatigue, back pain, orthostatic hypotension (resolved somewhat since initiation of therapy)   Education / Equipment: Pt/husband educated in HEP, fall prevention, continued community fitness  Plan: Patient agrees to discharge.  Patient goals were met. Patient is being discharged due to meeting the stated rehab goals.  ?????Recommend PT screen in 6 months due to progressive nature of disease.    Mady Haagensen, PT 11/06/16 1:36 PM Phone: [phone] Fax: [phone]

## 2016-11-11 ENCOUNTER — Ambulatory Visit: Payer: Medicare Other | Admitting: Occupational Therapy

## 2016-11-11 ENCOUNTER — Ambulatory Visit: Payer: Medicare Other

## 2016-11-11 DIAGNOSIS — R293 Abnormal posture: Secondary | ICD-10-CM | POA: Diagnosis not present

## 2016-11-11 DIAGNOSIS — M6281 Muscle weakness (generalized): Secondary | ICD-10-CM

## 2016-11-11 DIAGNOSIS — R29818 Other symptoms and signs involving the nervous system: Secondary | ICD-10-CM

## 2016-11-11 DIAGNOSIS — R471 Dysarthria and anarthria: Secondary | ICD-10-CM

## 2016-11-11 DIAGNOSIS — R278 Other lack of coordination: Secondary | ICD-10-CM

## 2016-11-11 DIAGNOSIS — R2689 Other abnormalities of gait and mobility: Secondary | ICD-10-CM | POA: Diagnosis not present

## 2016-11-11 DIAGNOSIS — R41844 Frontal lobe and executive function deficit: Secondary | ICD-10-CM | POA: Diagnosis not present

## 2016-11-11 DIAGNOSIS — R41841 Cognitive communication deficit: Secondary | ICD-10-CM

## 2016-11-11 DIAGNOSIS — R2681 Unsteadiness on feet: Secondary | ICD-10-CM | POA: Diagnosis not present

## 2016-11-11 NOTE — Therapy (Signed)
[facility] [address] [place], [name], [address] Phone: [phone]   Fax:  [phone]  Speech Language Pathology Treatment  Patient Details  Name: [name] MRN: [id number] Date of Birth: 06/02/1928 Referring Provider: Alonza Bogus, D.O.  Encounter Date: 11/11/2016      End of Session - 11/11/16 1612    Visit Number 2   Number of Visits 17   Date for SLP Re-Evaluation 12/25/16   SLP Start Time 1500  pt 10 minutes late then used restroom   SLP Stop Time  1532   SLP Time Calculation (min) 32 min   Activity Tolerance Patient tolerated treatment well      Past Medical History:  Diagnosis Date  . Anxiety   . Asthma   . Depression   . Diabetes mellitus without complication ([place])    6/76/72.Marland KitchenMarland Kitchenpt denies  . GERD (gastroesophageal reflux disease)   . Hearing loss   . NHL (non-Hodgkin's lymphoma) ([place])    nhl dx 9/04 breast ca dx1/12  . Parkinson disease [facility])     Past Surgical History:  Procedure Laterality Date  . Ba-HA Ear implant    . MASTECTOMY  2 /8/ 12   bilateral  . MASTECTOMY      There were no vitals filed for this visit.      Subjective Assessment - 11/11/16 1504    Subjective "Oh you have a new office." Pt was in same office three weeks ago.   Currently in Pain? Yes   Pain Score 5    Pain Location Back   Pain Orientation Right   Pain Descriptors / Indicators Sore   Pain Type Chronic pain   Pain Onset More than a month ago   Pain Frequency Intermittent   Aggravating Factors  getting up and moving (first thing in morning reported ok)   Pain Relieving Factors meds               ADULT SLP TREATMENT - 11/11/16 1607      General Information   Behavior/Cognition Alert;Cooperative;Pleasant mood;Confused;Hard of hearing     Treatment Provided   Treatment provided Cognitive-Linquistic     Cognitive-Linquistic Treatment   Treatment focused on Dysarthria   Skilled  Treatment SLP targeted louder conversational speech with pt today. Loud /a/ resulted in average 87dB with consistent min-mod A for loudness. Pt provided simple opposites in response with WNL loudness. In sentence respones pt averaged upper 60sdB, and upper 60s low 70s dB with SLP verbal cues for loudness. Pt did not always respond to nonverbal cues for loudness.     Assessment / Recommendations / Plan   Plan Continue with current plan of care     Progression Toward Goals   Progression toward goals Progressing toward goals          SLP Education - 11/11/16 1612    Education provided Yes   Education Details loud /a/, need for breath support for speech   Person(s) Educated Patient   Methods Explanation;Demonstration;Verbal cues;Handout   Comprehension Verbalized understanding;Returned demonstration;Verbal cues required;Need further instruction          SLP Short Term Goals - 11/11/16 1614      SLP SHORT TERM GOAL #1   Title Pt will demonstrate sustained /a/ of 85dB with rare min A over 3 sessions   Time 4   Period Weeks   Status On-going     SLP SHORT TERM GOAL #2   Title Pt will maintain average  of 70dB during structured speech tasks with occassional min A over 3 sessions    Time 4   Period Weeks   Status On-going          SLP Long Term Goals - 11/11/16 1615      SLP LONG TERM GOAL #1   Title Maintain an average of 70dB during conversation over 10 minutes with rare min A over two sessions    Time 8   Period Weeks   Status On-going     SLP LONG TERM GOAL #2   Title Converse audilbly in a noisy environment for 8 minutes with rare min A    Time 8   Period Weeks   Status On-going     SLP LONG TERM GOAL #3   Title pt will report husband asking less for her to repeat, than before ST   Time 8   Period Weeks   Status On-going          Plan - 11/11/16 1613    Clinical Impression Statement Pt's speech volume due to Parkinson's disease remains suboptimal resulting  in difficulty communicating with family and people in the community. Pt cont to report that her husband has difficulty hearing her. She would benefit from skilled ST focusing on increasing her conversational loudness.   Speech Therapy Frequency 2x / week   Duration --  8 weeks   Treatment/Interventions Compensatory strategies;Patient/family education;Functional tasks;Cueing hierarchy;SLP instruction and feedback;Internal/external aids;Cognitive reorganization  any or all may be used   Potential to Achieve Goals Fair   Potential Considerations Previous level of function;Severity of impairments   Consulted and Agree with Plan of Care Patient;Family member/caregiver   Family Member Consulted husband      Patient will benefit from skilled therapeutic intervention in order to improve the following deficits and impairments:   Dysarthria and anarthria  Cognitive communication deficit    Problem List Patient Active Problem List   Diagnosis Date Noted  . CAP (community acquired pneumonia) 04/13/2016  . PNA (pneumonia) 04/13/2016  . Murmur 09/02/2015  . Parkinson disease ([place]) 09/02/2015  . Bilateral carotid bruits 09/02/2015  . Confusion 08/07/2015  . Acute encephalopathy 08/07/2015  . UTI (lower urinary tract infection) 08/07/2015  . Acute kidney failure ([place]) 08/07/2015  . Paralysis agitans ([place]) 09/19/2012  . Lymphoma ([place]) 07/03/2011  . Malignant neoplasm of female breast ([place]) 07/03/2011  . Abnormal CT of the chest 03/13/2011  . HYPERTENSION, BENIGN 01/01/2009  . EDEMA 01/01/2009    [facility] ,MS, CCC-SLP  11/11/2016, 4:25 PM  [facility] [address] [place], [name], [address] Phone: [phone]   Fax:  [phone]   Name: [name] MRN: [id number] Date of Birth: 06-11-1928

## 2016-11-11 NOTE — Patient Instructions (Signed)
 *  Do FIVE loud "ah" twice each day.  *Do 20 minutes of speech homework 5 days a week.

## 2016-11-11 NOTE — Therapy (Signed)
[facility] [address] [place], [name], [address] Phone: [phone]   Fax:  [phone]  Occupational Therapy Treatment  Patient Details  Name: [name] MRN: [id number] Date of Birth: 09-10-1927 Referring Provider: Dr. [name]  Encounter Date: 11/11/2016      OT End of Session - 11/12/16 1613    Visit Number 3   Number of Visits 17   Date for OT Re-Evaluation 12/20/16   Authorization Type MCR, UHC secondary   Authorization - Visit Number 3   Authorization - Number of Visits 10   OT Start Time 5027   OT Stop Time 1615   OT Time Calculation (min) 40 min   Activity Tolerance Treatment limited secondary to medical complications (Comment)   Behavior During Therapy [facility] for tasks assessed/performed      Past Medical History:  Diagnosis Date  . Anxiety   . Asthma   . Depression   . Diabetes mellitus without complication ([place])    7/41/28.Marland KitchenMarland Kitchenpt denies  . GERD (gastroesophageal reflux disease)   . Hearing loss   . NHL (non-Hodgkin's lymphoma) ([place])    nhl dx 9/04 breast ca dx1/12  . Parkinson disease [facility])     Past Surgical History:  Procedure Laterality Date  . Ba-HA Ear implant    . MASTECTOMY  2 /8/ 12   bilateral  . MASTECTOMY      There were no vitals filed for this visit.      Subjective Assessment - 11/12/16 1611    Subjective  Pt reports she has not been feeling well today   Pertinent History Lumbar radiofrequency ablation (to treat lumbar facet joint pain)-last one 08/27/16 with relief of back pain; dementia due to Parkinson's; DVT (07/2016)   Patient Stated Goals improve independence with daily activities   Currently in Pain? Yes   Pain Score 5    Pain Location Back   Pain Orientation Right   Pain Descriptors / Indicators Sore   Pain Type Chronic pain   Pain Onset More than a month ago   Pain Frequency Intermittent   Aggravating Factors  making bed   Pain Relieving Factors meds/  repositioning   Multiple Pain Sites No          Treatment: PWR!up, rock and twist in seated 20 reps each, min-mod v.c. Pt ambulated to table and she had a loss of balance requiring therapist to assist with recovery. Pt reported feeling dizziness. BP 155/54, Therapist had pt lay down and her BP improved to 184/83 in supine, BP in seated 141/73, then standing 130/63. Pt felt better so she rode the arm bike x 5 mins level 1 for conditioning, pt maintained 30 rpm, BP afterwards 180/90. Therapist recommended pt's husband monitors BP at home and he should call pt's MD if further BP issues. Pt's husband reports she sees cardiologist next week.                       OT Short Term Goals - 10/28/16 1207      OT SHORT TERM GOAL #1   Title I with PD specific HEP .- 11/20/16   Time 4   Period Weeks   Status New     OT SHORT TERM GOAL #2   Title Pt will demonstrate understanding of adapted strategies for ADLs/IADLs.   Time 4   Period Weeks   Status New     OT SHORT TERM GOAL #3   Title Pt  will demonstrate ability to write a short paragraph with 100% legibility and only minimal decrease in letter size.   Time 4   Period Weeks   Status New     OT SHORT TERM GOAL #4   Title Pt will demonstrate improved bilateral UE functional use as evidenced by increasing box/ blocks score by 3 blocks.   Time 4   Period Weeks   Status New     OT SHORT TERM GOAL #5   Title Pt will increase right shoulder flexion to 125* with -15 elbow ext to retrieve a lightweight object.   Baseline sh flexion: 120, elbow ext -20   Time 4   Period Weeks   Status New           OT Long Term Goals - 10/28/16 1207      OT LONG TERM GOAL #1   Title Pt will demonstrate improved safety for ADLs as evidenced by increasing bilateral standing functional reach to 10 inches or greater.- due 10/19/16   Time 8   Period Weeks   Status New     OT LONG TERM GOAL #2   Title Pt will improve R dominant hand  coordination for ADLs as shown by improving 9-hole peg test score by at least 3sec.    Baseline R 31.41 secs, LUE 30.32 secs   Time 8   Period Weeks   Status New     OT LONG TERM GOAL #3   Title Pt will demonstrate improved ease with fastening buttons as evidenced by decreasing 3 button/ unbutton to 32 secs or less.   Baseline 36.72 secs   Time 8   Period Weeks   Status New     OT LONG TERM GOAL #4   Title Pt will verbalize understanding of memory, visual compensation strategies for ADLs prn.   Time 8   Period Weeks   Status New     OT LONG TERM GOAL #5   Title --------------------------------------------------------------------------------------------------------               Plan - 11/12/16 1612    Clinical Impression Statement Pt is progressing towards goals. BP issues limited pt participation today.   Rehab Potential Good   OT Frequency 2x / week   OT Duration 8 weeks   OT Treatment/Interventions Self-care/ADL training;DME and/or AE instruction;Splinting;Patient/family education;Balance training;Therapeutic exercises;Ultrasound;Therapeutic exercise;Therapeutic activities;Cognitive remediation/compensation;Passive range of motion;Neuromuscular education;Cryotherapy;Electrical Stimulation;Parrafin;Energy conservation;Manual Therapy;Visual/perceptual remediation/compensation   Plan issue HEP as able PWR! seated vs. bag ex   Consulted and Agree with Plan of Care Patient      Patient will benefit from skilled therapeutic intervention in order to improve the following deficits and impairments:  Abnormal gait, Decreased cognition, Impaired flexibility, Pain, Decreased mobility, Decreased coordination, Decreased activity tolerance, Decreased endurance, Decreased range of motion, Decreased strength, Impaired UE functional use, Difficulty walking, Decreased safety awareness, Decreased knowledge of precautions, Decreased balance  Visit Diagnosis: Other symptoms and signs  involving the nervous system  Muscle weakness (generalized)  Abnormal posture  Frontal lobe and executive function deficit  Other lack of coordination    Problem List Patient Active Problem List   Diagnosis Date Noted  . CAP (community acquired pneumonia) 04/13/2016  . PNA (pneumonia) 04/13/2016  . Murmur 09/02/2015  . Parkinson disease ([place]) 09/02/2015  . Bilateral carotid bruits 09/02/2015  . Confusion 08/07/2015  . Acute encephalopathy 08/07/2015  . UTI (lower urinary tract infection) 08/07/2015  . Acute kidney failure ([place]) 08/07/2015  . Paralysis agitans ([place]) 09/19/2012  .  Lymphoma ([place]) 07/03/2011  . Malignant neoplasm of female breast ([place]) 07/03/2011  . Abnormal CT of the chest 03/13/2011  . HYPERTENSION, BENIGN 01/01/2009  . EDEMA 01/01/2009    [name] 11/12/2016, 4:15 PM  [facility] [address] [place], [name], [address] Phone: [phone]   Fax:  [phone]  Name: [name] MRN: [id number] Date of Birth: 02-05-1928

## 2016-11-12 ENCOUNTER — Ambulatory Visit: Payer: Medicare Other | Admitting: Physical Therapy

## 2016-11-12 ENCOUNTER — Ambulatory Visit: Payer: Medicare Other

## 2016-11-16 ENCOUNTER — Ambulatory Visit: Payer: Medicare Other | Admitting: Occupational Therapy

## 2016-11-16 DIAGNOSIS — R2689 Other abnormalities of gait and mobility: Secondary | ICD-10-CM

## 2016-11-16 DIAGNOSIS — R41844 Frontal lobe and executive function deficit: Secondary | ICD-10-CM

## 2016-11-16 DIAGNOSIS — R2681 Unsteadiness on feet: Secondary | ICD-10-CM

## 2016-11-16 DIAGNOSIS — R293 Abnormal posture: Secondary | ICD-10-CM

## 2016-11-16 DIAGNOSIS — M6281 Muscle weakness (generalized): Secondary | ICD-10-CM

## 2016-11-16 DIAGNOSIS — R29818 Other symptoms and signs involving the nervous system: Secondary | ICD-10-CM | POA: Diagnosis not present

## 2016-11-16 DIAGNOSIS — R278 Other lack of coordination: Secondary | ICD-10-CM

## 2016-11-16 NOTE — Patient Instructions (Addendum)
Bag Exercises:  Small trash bag or produce bag works best.  For all exercises, sit with big posture (sit up tall with head up) and use big movements. Perform the following exercises 1 times per day.  1.  Hold end of bag in one hand. Stretch fingers out big to draw the entire bag into your palm. Repeat 5 times with each hand.  2.  Hold bag in one hand. Stretch both arms/hands out to the side as big as you can. Then, pass bag from one hand to the other IN FRONT of you. Stretch arms back out big after each pass. Repeat 10 times.  3.  Hold bag in one hand. Stretch both arms/hands out to the side as big as you can. Then, pass bag from one hand to the other BEHIND you. Stretch arms back out big after each pass. Repeat 10 times.  4. Hold bag in both hands in front of you with hands/arms shoulder length apart. Move bag behind your head. Repeat 10 times.

## 2016-11-16 NOTE — Therapy (Signed)
[facility] [address] [place], [name], [address] Phone: [phone]   Fax:  [phone]  Occupational Therapy Treatment  Patient Details  Name: [name] MRN: [id number] Date of Birth: 1928/01/05 Referring Provider: Dr. [name]  Encounter Date: 11/16/2016      OT End of Session - 11/16/16 1112    Visit Number 4   Number of Visits 17   Date for OT Re-Evaluation 12/20/16   Authorization Type MCR, UHC secondary   Authorization - Visit Number 4   Authorization - Number of Visits 10   OT Start Time 1108   OT Stop Time 1150   OT Time Calculation (min) 42 min   Activity Tolerance Patient tolerated treatment well   Behavior During Therapy [facility] for tasks assessed/performed      Past Medical History:  Diagnosis Date  . Anxiety   . Asthma   . Depression   . Diabetes mellitus without complication ([place])    6/96/29.Marland KitchenMarland Kitchenpt denies  . GERD (gastroesophageal reflux disease)   . Hearing loss   . NHL (non-Hodgkin's lymphoma) ([place])    nhl dx 9/04 breast ca dx1/12  . Parkinson disease [facility])     Past Surgical History:  Procedure Laterality Date  . Ba-HA Ear implant    . MASTECTOMY  2 /8/ 12   bilateral  . MASTECTOMY      There were no vitals filed for this visit.      Subjective Assessment - 11/16/16 1110    Subjective  Pt reports that he back is bothering her   Pertinent History Lumbar radiofrequency ablation (to treat lumbar facet joint pain)-last one 08/27/16 with relief of back pain; dementia due to Parkinson's; DVT (07/2016)   Patient Stated Goals improve independence with daily activities   Currently in Pain? Yes   Pain Score 4    Pain Location Back   Pain Orientation Right   Pain Descriptors / Indicators Sore   Pain Type Chronic pain   Pain Onset More than a month ago   Pain Frequency Intermittent   Aggravating Factors  bending   Pain Relieving Factors meds, repositionin        Self-care:  Attempted simulated donning/doffing pants with bag using large amplitude movements.  Pt completed 3 repetitions with each leg, but then discontinued due to reports of back discomfort.     Began going through pt's exercise/education folder from previous rounds of therapy (PT, OT, ST) and began removing/consolidating exercises.  Pt would benefit from further reduction of HEP due to cognitive deficits.    Pt wrote approx 1/3 page of text with minimal decr in size at times, but good legibility on lined paper (with the pencil grip--pt has one at home).  Then wrote "greeting card" on unlined paper (using the pencil grip) with good legibility and only min decr in size.                       OT Education - 11/16/16 1142    Education provided Yes   Education Details Bag Exercises--see pt instructions (for simulated ADL movements)   Person(s) Educated Patient   Methods Explanation;Demonstration;Handout;Verbal cues   Comprehension Verbalized understanding;Returned demonstration;Verbal cues required  min-mod cueing          OT Short Term Goals - 11/16/16 1147      OT SHORT TERM GOAL #1   Title I with PD specific HEP .- 11/20/16   Time 4   Period  Weeks   Status On-going     OT SHORT TERM GOAL #2   Title Pt will demonstrate understanding of adapted strategies for ADLs/IADLs.   Time 4   Period Weeks   Status New     OT SHORT TERM GOAL #3   Title Pt will demonstrate ability to write a short paragraph with 100% legibility and only minimal decrease in letter size.   Time 4   Period Weeks   Status Achieved  11/16/16     OT SHORT TERM GOAL #4   Title Pt will demonstrate improved bilateral UE functional use as evidenced by increasing box/ blocks score by 3 blocks.   Time 4   Period Weeks   Status New     OT SHORT TERM GOAL #5   Title Pt will increase right shoulder flexion to 125* with -15 elbow ext to retrieve a lightweight object.   Baseline sh  flexion: 120, elbow ext -20   Time 4   Period Weeks   Status New           OT Long Term Goals - 10/28/16 1207      OT LONG TERM GOAL #1   Title Pt will demonstrate improved safety for ADLs as evidenced by increasing bilateral standing functional reach to 10 inches or greater.- due 10/19/16   Time 8   Period Weeks   Status New     OT LONG TERM GOAL #2   Title Pt will improve R dominant hand coordination for ADLs as shown by improving 9-hole peg test score by at least 3sec.    Baseline R 31.41 secs, LUE 30.32 secs   Time 8   Period Weeks   Status New     OT LONG TERM GOAL #3   Title Pt will demonstrate improved ease with fastening buttons as evidenced by decreasing 3 button/ unbutton to 32 secs or less.   Baseline 36.72 secs   Time 8   Period Weeks   Status New     OT LONG TERM GOAL #4   Title Pt will verbalize understanding of memory, visual compensation strategies for ADLs prn.   Time 8   Period Weeks   Status New     OT LONG TERM GOAL #5   Title --------------------------------------------------------------------------------------------------------               Plan - 11/16/16 1114    Clinical Impression Statement Pt is progressing towards goals.  Pt with improving handwriting (goal met).  However, aphasia/cognitive deficits affect writing.   Rehab Potential Good   OT Frequency 2x / week   OT Duration 8 weeks   OT Treatment/Interventions Self-care/ADL training;DME and/or AE instruction;Splinting;Patient/family education;Balance training;Therapeutic exercises;Ultrasound;Therapeutic exercise;Therapeutic activities;Cognitive remediation/compensation;Passive range of motion;Neuromuscular education;Cryotherapy;Electrical Stimulation;Parrafin;Energy conservation;Manual Therapy;Visual/perceptual remediation/compensation   Plan review bag exercises; work with PT/ST to consolidate/decr number of exercises that pt is doing due to cognitive deficits, continue to address  STGs   Consulted and Agree with Plan of Care Patient      Patient will benefit from skilled therapeutic intervention in order to improve the following deficits and impairments:  Abnormal gait, Decreased cognition, Impaired flexibility, Pain, Decreased mobility, Decreased coordination, Decreased activity tolerance, Decreased endurance, Decreased range of motion, Decreased strength, Impaired UE functional use, Difficulty walking, Decreased safety awareness, Decreased knowledge of precautions, Decreased balance  Visit Diagnosis: Other symptoms and signs involving the nervous system  Muscle weakness (generalized)  Abnormal posture  Frontal lobe and executive function deficit  Other lack  of coordination  Other abnormalities of gait and mobility  Unsteadiness on feet    Problem List Patient Active Problem List   Diagnosis Date Noted  . CAP (community acquired pneumonia) 04/13/2016  . PNA (pneumonia) 04/13/2016  . Murmur 09/02/2015  . Parkinson disease ([place]) 09/02/2015  . Bilateral carotid bruits 09/02/2015  . Confusion 08/07/2015  . Acute encephalopathy 08/07/2015  . UTI (lower urinary tract infection) 08/07/2015  . Acute kidney failure ([place]) 08/07/2015  . Paralysis agitans ([place]) 09/19/2012  . Lymphoma ([place]) 07/03/2011  . Malignant neoplasm of female breast ([place]) 07/03/2011  . Abnormal CT of the chest 03/13/2011  . HYPERTENSION, BENIGN 01/01/2009  . EDEMA 01/01/2009    [facility] 11/16/2016, 12:51 PM  [facility] [address] [place], [name], [address] Phone: [phone]   Fax:  [phone]  Name: [name] MRN: [id number] Date of Birth: April 25, 1928   Vianne Bulls, OTR/L [facility] [address] [place], [place]  [address] [phone] phone [phone] 11/16/16 12:51 PM

## 2016-11-17 ENCOUNTER — Encounter: Payer: Self-pay | Admitting: Cardiology

## 2016-11-17 ENCOUNTER — Ambulatory Visit (INDEPENDENT_AMBULATORY_CARE_PROVIDER_SITE_OTHER): Payer: Medicare Other | Admitting: Cardiology

## 2016-11-17 VITALS — BP 154/82 | HR 80 | Ht 63.0 in | Wt 126.8 lb

## 2016-11-17 DIAGNOSIS — I350 Nonrheumatic aortic (valve) stenosis: Secondary | ICD-10-CM

## 2016-11-17 DIAGNOSIS — G20 Parkinson's disease: Secondary | ICD-10-CM

## 2016-11-17 DIAGNOSIS — R0989 Other specified symptoms and signs involving the circulatory and respiratory systems: Secondary | ICD-10-CM | POA: Diagnosis not present

## 2016-11-17 DIAGNOSIS — G20A1 Parkinson's disease without dyskinesia, without mention of fluctuations: Secondary | ICD-10-CM

## 2016-11-17 NOTE — Progress Notes (Signed)
Cardiology Office Note    Date:  11/17/2016   ID:  [name], DOB 30-May-1928, MRN [id number]  PCP:  [name], MD  Cardiologist:   [name], MD     History of Present Illness:  [name] is a 83 y.o. female here for Follow-up of aortic stenosis. She's not having any shortness of breath, no chest pain, no unexplained syncope. No palpitations. She has been seeing Dr. [name] for years for Parkinson's.  She was hospitalized in September 2017 with community-acquired pneumonia.  She has also had some orthostatic hypotension for instance on 09/11/16 supine 185/89, 1 minute after standing 113/52 with wooziness, 3 minutes 128/65. She is hydrating. Dr. Luna Glasgow the possibility of compression binder.   Another 126/67 sitting, standing 87/47  Her husband also is a patient of mine. Prior bypass surgery.  Overall she has been doing quite well however she does have high blood pressure readings occasionally while at Parkinson's rehabilitation. She has not had any further syncopal episodes. No chest pain, no shortness of breath.   Past Medical History:  Diagnosis Date  . Anxiety   . Asthma   . Depression   . Diabetes mellitus without complication ([place])    08/14/12.Marland KitchenMarland Kitchenpt denies  . GERD (gastroesophageal reflux disease)   . Hearing loss   . NHL (non-Hodgkin's lymphoma) ([place])    nhl dx 9/04 breast ca dx1/12  . Parkinson disease [facility])     Past Surgical History:  Procedure Laterality Date  . Ba-HA Ear implant    . MASTECTOMY  2 /8/ 12   bilateral  . MASTECTOMY      Outpatient Medications Prior to Visit  Medication Sig Dispense Refill  . acetaminophen (TYLENOL) 325 MG tablet Take 650 mg by mouth every 6 (six) hours as needed for moderate pain.     [name] acyclovir (ZOVIRAX) 400 MG tablet TAKE 1 TABLET BY MOUTH TWICE DAILY 180 tablet 3  . bisacodyl (DULCOLAX) 5 MG EC tablet Take 5 mg by mouth daily as needed for moderate constipation.    . Calcium Carbonate-Vitamin D3  (CALCIUM 600-D) 600-400 MG-UNIT TABS Take 1 tablet by mouth at bedtime.    . carbidopa-levodopa (SINEMET IR) 25-100 MG tablet 1 tablet in the morning by mouth, 1 in the afternoon by mouth, 2 in the evening by mouth. 360 tablet 1  . cetirizine (ZYRTEC) 10 MG tablet Take 10 mg by mouth daily at 12 noon.     . cholecalciferol (VITAMIN D) 1000 units tablet Take 1,000 Units by mouth every evening.     . Fluticasone-Salmeterol (ADVAIR) 250-50 MCG/DOSE AEPB Inhale 1 puff into the lungs 2 (two) times daily.    . meloxicam (MOBIC) 15 MG tablet Take 15 mg by mouth daily.    . mirabegron ER (MYRBETRIQ) 50 MG TB24 tablet Take 50 mg by mouth daily at 12 noon. Reported on 01/30/2016    . Multiple Vitamins-Minerals (MULTIVITAMIN WITH MINERALS) tablet Take 1 tablet by mouth daily with breakfast.     . entacapone (COMTAN) 200 MG tablet TAKE 1 TABLET (200 MG TOTAL) BY MOUTH 3 (THREE) TIMES DAILY. (Patient taking differently: No sig reported) 270 tablet 3  . sertraline (ZOLOFT) 25 MG tablet Take one tablet daily for one week, then take two tablets once daily (Patient taking differently: Take 25 mg by mouth at bedtime. ) 60 tablet 5   No facility-administered medications prior to visit.      Allergies:   Patient has no known allergies.   Social  History   Social History  . Marital status: Married    Spouse name: N/A  . Number of children: N/A  . Years of education: N/A   Occupational History  . retired Retired    Pharmacist, hospital   Social History Main Topics  . Smoking status: Never Smoker  . Smokeless tobacco: Never Used     Comment: husband wsa smoker  . Alcohol use No     Comment: no  . Drug use: No  . Sexual activity: Not Asked   Other Topics Concern  . None   Social History Narrative  . None     Family History:  The patient's family history includes Cancer in her father and mother; Lung cancer in her father.   ROS:   Please see the history of present illness.    ROS  no recent fevers,  chills, orthopnea, PND, no strokelike symptoms All other systems reviewed and are negative.   PHYSICAL EXAM:   VS:  BP (!) 154/82   Pulse 80   Ht 5\' 3"  (1.6 m)   Wt 126 lb 12.8 oz (57.5 kg)   LMP  (LMP Unknown)   BMI 22.46 kg/m    GEN: Well nourished, well developed, in no acute distress  HEENT: normal  Neck: no JVD, + bilateral radiation carotid bruits, or masses Cardiac: RRR; 2/6 SM, no rubs, or gallops,no edema  Respiratory:  clear to auscultation bilaterally, normal work of breathing GI: soft, nontender, nondistended, + BS MS: no deformity or atrophy  Skin: warm and dry, no rash Neuro:  Alert and Oriented x 3, Strength and sensation are intact Psych: euthymic mood, full affect   Wt Readings from Last 3 Encounters:  11/17/16 126 lb 12.8 oz (57.5 kg)  09/15/16 126 lb 3.2 oz (57.2 kg)  08/19/16 133 lb (60.3 kg)      Studies/Labs Reviewed:   EKG:  None today  Recent Labs: 08/06/2016: ALT 9; BUN 15.7; Creatinine 1.1; HGB 10.8; Platelets 215; Potassium 4.5; Sodium 143   Lipid Panel No results found for: CHOL, TRIG, HDL, CHOLHDL, VLDL, LDLCALC, LDLDIRECT  Additional studies/ records that were reviewed today include:  Prior hospital records, office notes reviewed, lab work reviewed  Echocardiogram 09/12/15: - Compared to a prior echo in 2009, there is now moderate aortic   stenosis with AVA around 1.1-1.2 cm2. The LVEF is higher at   65-70%.  ECHO 10/02/16 - Left ventricle: The cavity size was normal. Wall thickness was   increased in a pattern of mild LVH. Systolic function was normal.   The estimated ejection fraction was in the range of 60% to 65%.   Wall motion was normal; there were no regional wall motion   abnormalities. Doppler parameters are consistent with abnormal   left ventricular relaxation (grade 1 diastolic dysfunction). The   E/e&' ratio is >15, suggesting elevated LV filling pressure. - Aortic valve: Calcified leaflets with restricted excursion.  There   is moderate stenosis. There was trivial regurgitation. Mean   gradient (S): 31 mm Hg. Peak gradient (S): 47 mm Hg. Valve area   (VTI): 0.8 cm^2. - Mitral valve: Calcified annulus. Mildly thickened leaflets .   There was mild regurgitation. - Left atrium: The atrium was normal in size. - Atrial septum: No defect or patent foramen ovale was identified. - Tricuspid valve: There was mild regurgitation. - Pulmonary arteries: PA peak pressure: 33 mm Hg (S). - Inferior vena cava: The vessel was normal in size. The  respirophasic diameter changes were in the normal range (>= 50%),   consistent with normal central venous pressure.  Impressions:  - Compared to a prior study in 2017, the mean aortic valve gradient   has increased rom 28 to 31 mmHg, consistent with moderate   stenosis.   ASSESSMENT:    1. Nonrheumatic aortic valve stenosis   2. Parkinson disease ([place])   3. Bilateral carotid bruits      PLAN:  In order of problems listed above:  Aortic stenosis -Continues to be moderate in severity on repeat echocardiogram. Has been battling with some orthostatic hypotension. With her moderate severity aortic stenosis, this should not be playing a significant hemodynamic role at this point. Check yearly echocardiogram.  Parkinson's disease -Her orthostatic hypotension likely goes hand in hand with dysautonomia. Salt liberalization, fluids, support hose. Reviewed Dr. [name] note. -I'm willing to tolerate a higher blood pressure given her prior issues with severe orthostatic hypotension. Of course, if she has significant symptoms with her high blood pressure i.e. strokelike symptoms or severe shortness of breath, she is to seek medical attention.  Orthostatic hypotension -As above   One year follow-up  Medication Adjustments/Labs and Tests Ordered: Current medicines are reviewed at length with the patient today.  Concerns regarding medicines are outlined above.  Medication  changes, Labs and Tests ordered today are listed in the Patient Instructions below. Patient Instructions  Medication Instructions:  The current medical regimen is effective;  continue present plan and medications.  Follow-Up: Follow up in 1 year with Dr. Marlou Porch.  You will receive a letter in the mail 2 months before you are due.  Please call us when you receive this letter to schedule your follow up appointment.  If you need a refill on your cardiac medications before your next appointment, please call your pharmacy.  Thank you for choosing [facility]!!          Signed, [name], MD  11/17/2016 11:33 AM    [facility] [place], [place], [place]  [address] Phone: [phone]; Fax: [phone]

## 2016-11-17 NOTE — Patient Instructions (Signed)

## 2016-11-18 ENCOUNTER — Ambulatory Visit: Payer: Medicare Other | Admitting: Occupational Therapy

## 2016-11-18 DIAGNOSIS — R293 Abnormal posture: Secondary | ICD-10-CM

## 2016-11-18 DIAGNOSIS — R29818 Other symptoms and signs involving the nervous system: Secondary | ICD-10-CM

## 2016-11-18 DIAGNOSIS — R41844 Frontal lobe and executive function deficit: Secondary | ICD-10-CM

## 2016-11-18 DIAGNOSIS — R2689 Other abnormalities of gait and mobility: Secondary | ICD-10-CM | POA: Diagnosis not present

## 2016-11-18 DIAGNOSIS — M6281 Muscle weakness (generalized): Secondary | ICD-10-CM

## 2016-11-18 DIAGNOSIS — R2681 Unsteadiness on feet: Secondary | ICD-10-CM | POA: Diagnosis not present

## 2016-11-18 DIAGNOSIS — R278 Other lack of coordination: Secondary | ICD-10-CM

## 2016-11-18 NOTE — Therapy (Signed)
[facility] [address] [place], [name], [address] Phone: [phone]   Fax:  [phone]  Occupational Therapy Treatment  Patient Details  Name: [name] MRN: [id number] Date of Birth: Nov 06, 1927 Referring Provider: Dr. [name]  Encounter Date: 11/18/2016      OT End of Session - 11/18/16 1309    Visit Number 5   Number of Visits 17   Date for OT Re-Evaluation 12/20/16   Authorization Type MCR, [employer] secondary   Authorization - Visit Number 5   Authorization - Number of Visits 10   OT Start Time 1020   OT Stop Time 1100   OT Time Calculation (min) 40 min      Past Medical History:  Diagnosis Date  . Anxiety   . Asthma   . Depression   . Diabetes mellitus without complication ([place])    09/22/31.Marland KitchenMarland Kitchenpt denies  . GERD (gastroesophageal reflux disease)   . Hearing loss   . NHL (non-Hodgkin's lymphoma) ([place])    nhl dx 9/04 breast ca dx1/12  . Parkinson disease [facility])     Past Surgical History:  Procedure Laterality Date  . Ba-HA Ear implant    . MASTECTOMY  2 /8/ 12   bilateral  . MASTECTOMY      There were no vitals filed for this visit.      Subjective Assessment - 11/18/16 1304    Subjective  Pt reports cardiologist said not to worry about her BP(per notes he is aware of orthostatic hypotension   Pertinent History Lumbar radiofrequency ablation (to treat lumbar facet joint pain)-last one 08/27/16 with relief of back pain; dementia due to Parkinson's; DVT (07/2016)   Patient Stated Goals improve independence with daily activities   Currently in Pain? Yes   Pain Score 3    Pain Orientation Right   Pain Descriptors / Indicators Sore   Pain Type Chronic pain   Pain Onset More than a month ago   Aggravating Factors  malpositioning   Pain Relieving Factors reposistioning                      Arm bike x 5 mins level 1 for conditioning, pt maintained  35 rpm Therapist encouraged pt  to use her cane at all times, pt had a LOB walking from waiting room to gym, requiring therapist assist to recover. Therapist discussed with pt/ husband importance of sitting at side of bed before standing up after laying down due to orthostatic hypotension.         OT Education - 11/18/16 1308    Education provided Yes   Education Details reviewed bag exercises from HEP, discussed avoiding bending to pick up itmes off floor for safety and therapist recommended pt does not make bed as it appears to aggravate back   Person(s) Educated Patient   Methods Explanation;Demonstration;mod Verbal cues required    Comprehension Verbalized understanding;Returned demonstration;Verbal cues required          OT Short Term Goals - 11/16/16 1147      OT SHORT TERM GOAL #1   Title I with PD specific HEP .- 11/20/16   Time 4   Period Weeks   Status On-going     OT SHORT TERM GOAL #2   Title Pt will demonstrate understanding of adapted strategies for ADLs/IADLs.   Time 4   Period Weeks   Status New     OT SHORT TERM GOAL #3   Title Pt  will demonstrate ability to write a short paragraph with 100% legibility and only minimal decrease in letter size.   Time 4   Period Weeks   Status Achieved  11/16/16     OT SHORT TERM GOAL #4   Title Pt will demonstrate improved bilateral UE functional use as evidenced by increasing box/ blocks score by 3 blocks.   Time 4   Period Weeks   Status New     OT SHORT TERM GOAL #5   Title Pt will increase right shoulder flexion to 125* with -15 elbow ext to retrieve a lightweight object.   Baseline sh flexion: 120, elbow ext -20   Time 4   Period Weeks   Status New           OT Long Term Goals - 10/28/16 1207      OT LONG TERM GOAL #1   Title Pt will demonstrate improved safety for ADLs as evidenced by increasing bilateral standing functional reach to 10 inches or greater.- due 10/19/16   Time 8   Period Weeks   Status New     OT LONG TERM GOAL #2    Title Pt will improve R dominant hand coordination for ADLs as shown by improving 9-hole peg test score by at least 3sec.    Baseline R 31.41 secs, LUE 30.32 secs   Time 8   Period Weeks   Status New     OT LONG TERM GOAL #3   Title Pt will demonstrate improved ease with fastening buttons as evidenced by decreasing 3 button/ unbutton to 32 secs or less.   Baseline 36.72 secs   Time 8   Period Weeks   Status New     OT LONG TERM GOAL #4   Title Pt will verbalize understanding of memory, visual compensation strategies for ADLs prn.   Time 8   Period Weeks   Status New     OT LONG TERM GOAL #5   Title --------------------------------------------------------------------------------------------------------               Plan - 11/18/16 1307    Clinical Impression Statement Pt is progressing towards goals, however she requires repetition/ v.c. for activities due to cognitive deficits.   Rehab Potential Good   OT Frequency 2x / week   OT Duration 8 weeks   OT Treatment/Interventions Self-care/ADL training;DME and/or AE instruction;Splinting;Patient/family education;Balance training;Therapeutic exercises;Ultrasound;Therapeutic exercise;Therapeutic activities;Cognitive remediation/compensation;Passive range of motion;Neuromuscular education;Cryotherapy;Electrical Stimulation;[place]rafin;Energy conservation;Manual Therapy;Visual/perceptual remediation/compensation   Plan continue to address STG's   Consulted and Agree with Plan of Care Patient      Patient will benefit from skilled therapeutic intervention in order to improve the following deficits and impairments:  Abnormal gait, Decreased cognition, Impaired flexibility, Pain, Decreased mobility, Decreased coordination, Decreased activity tolerance, Decreased endurance, Decreased range of motion, Decreased strength, Impaired UE functional use, Difficulty walking, Decreased safety awareness, Decreased knowledge of precautions,  Decreased balance  Visit Diagnosis: Other symptoms and signs involving the nervous system  Muscle weakness (generalized)  Abnormal posture  Frontal lobe and executive function deficit  Other lack of coordination    Problem List Patient Active Problem List   Diagnosis Date Noted  . CAP (community acquired pneumonia) 04/13/2016  . PNA (pneumonia) 04/13/2016  . Murmur 09/02/2015  . Parkinson disease ([place]) 09/02/2015  . Bilateral carotid bruits 09/02/2015  . Confusion 08/07/2015  . Acute encephalopathy 08/07/2015  . UTI (lower urinary tract infection) 08/07/2015  . Acute kidney failure ([place]) 08/07/2015  . Paralysis agitans (  [place]) 09/19/2012  . Lymphoma ([place]) 07/03/2011  . Malignant neoplasm of female breast ([place]) 07/03/2011  . Abnormal CT of the chest 03/13/2011  . HYPERTENSION, BENIGN 01/01/2009  . EDEMA 01/01/2009    Rhylie Stehr 11/18/2016, 1:11 PM  [facility] [address] [place], [name], [address] Phone: [phone]   Fax:  [phone]  Name: Deepika [place] MRN: [id number] Date of Birth: 1928/02/23

## 2016-11-20 NOTE — Progress Notes (Signed)
[name] was seen today in the movement disorders clinic for neurologic consultation at the request of Dr. Betsy Coder.    The consultation is for the evaluation of ataxia and tremor.  The patient has been a previous pt of Dr. Erling Cruz.  Dr. Erling Cruz dx the patient with parkinsonism and ET.  This is an 83 y/o female with a complex medical hx of both non Hodgkins lymphoma require chemotherapy and breast CA, s/p bilateral mastectomy.  She has a long hx of tremor.  I reviewed Dr. Bernardo Heater notes that were made available to me.  It appears that she presented with right hand resting tremor in 2009, without associated rigidity or bradykinesia.  There were no known medications causing the problem and it appears that she was initially dx with ET and later transitioned the dx to parkinsonism.  The patient reports no tremor in the L hand.  Her husband reports a little tremor in the R leg.  The patient reports that tremor is most significant with relaxing and she has no tremor if her hands are preoccupied.    She has never been on PD medication or been to PT until yesterday.  03/16/13 update:  The pt presents today with her husband.  She has not been seen since February, 2014 at which time I dx her with PD.  She was started on levodopa 25/100 three times per day.  She did not follow up sooner as she had bronchitis and pneumonia for the last 2 months and is just now recovering.  She did go to PT and liked it but would like to go back.  She is doing well on the levodopa.  Her husband rarely notice tremor any longer.  06/28/13 update:    The pt today presents for f/u with her husband who supplements the hx.  She is on carbidopa/levodopa 25/100 three times per day.  She is doing well but notices R arm tremor now that had gone away.  Rarely she will note tremor in the L hand which she had not noted previously.  She does have some cramping of the feet.  She is unsure if this is associated with wearing off.  She did have some  of this while in my office, and it was time for her to redose the carbidopa/levodopa.  She is now attending therapy, and is enjoying and finding benefit from it.  08/08/12 update:  The patient presents today with her husband, who supplements the history.  The patient has Parkinson's disease and is currently on carbidopa/levodopa 25/100, one tablet 3 times per day.  Last visit, she was describing some cramping in her feet and I was trying to figure out whether or not this was an "off" phenomenon.  After paying attention, the patient called and said that she thought it was and we added entacapone.  The cramping in the feet is better but she is now having a stabbing and shooting pain down the back of the leg and to the lateral side of the lower leg that stops at the ankle.  It has stopped her from exercising.  She sometimes has back pain.  This is fairly inconsistent.  The leg pain seems to start about 4 days after she finished rehabilitation, which did seem to help.  She is not sure if the pain is related to the rehabilitation, however.  10/03/13 update:  The patient presents today with her husband, who supplements the history.  The patient is currently on carbidopa/levodopa 25/100, one  tablet 3 times per day.  She is also on Comtan 200 mg, one tablet 3 times per day.  From a Parkinson's standpoint, she has been doing well.  No falls.  No hallucinations.  Her husband rarely sees tremor, and that is primarily when she is in bed at night and he will feel it rather than see it.  Last visit, she was complaining of back pain that was likely sciatica.  I ordered an EMG but they ultimately canceled that.  I tried her on Lyrica but that caused her to be excessively sleepy.  She went to the chiropractor and states that she is doing much better from that regard.  Unfortunately, she has had bronchitis and a urinary tract infection.  She has been on several antibiotics, which have caused her to be nauseated.  She has also had  dental work and had several of her teeth pulled.  Because of these things, she has had a decreased appetite and really has not been able to eat.  She had a temporary partial placed, but her appetite still hasn't been what it used to be, primarily because of the nausea from the antibiotic.  She has not been exercising because of being sick.  She is hoping to get back to that soon.  02/02/14 update:  The patient presents today with her husband, who supplements the history.  The patient is currently on carbidopa/levodopa 25/100, 1 tablet at 9 AM/1 PM and 2 tablets at 6 PM.   She is also on Comtan 200 mg, one tablet 3 times per day.   The discomfort in the foot is much better.  From a Parkinson's standpoint, she has been doing well.  No falls.  No hallucinations.  The patient was screened through our Parkinson's screening program on 01/16/2014 and was either at baseline or better than baseline at all therapies and is scheduled for re-screening in January.  She is not currently exercising.    06/05/14 update:  The patient presents today with her husband, who supplements the history.  The patient is currently on carbidopa/levodopa 25/100, 1 tablet at 9 AM/1 PM and 2 tablets at 6 PM.   She is also on Comtan 200 mg, one tablet 3 times per day.  No falls.  No lightheadedness or near syncope.  No hallucinations.   I reviewed records since last visit.  Weight loss has been a concern.  The dietician has talked with her about suggestions to help, and the patient states that she was told to take Carnation 3 times per day.  Pt states that the biggest issue that has caused weight loss has been persistent nausea, which has been going on for about 2 months.  They really do not think that it is related to the Parkinson's medications, because it did not start with changes in Parkinson's medications.  It actually seemed to start after a visit to a restaurant and then persisted.  She just feels nauseated, has lost taste for food and  has lost appetite.  She followed up with Dr. Kenton Kingfisher and was started on Zantac about 3 or 4 days ago and does feel better and her blood pressure medication was discontinued.  10/04/14 update:  The patient presents today with her husband, who supplements the history.  The patient is currently on carbidopa/levodopa 25/100, 1 tablet at 9 AM/1 PM and 2 tablets at 6 PM.   She is also on Comtan 200 mg, one tablet 3 times per day. We d/c her comtan  for a week to see if her GI upset changed at all and it did not so we restarted it since obviously that was not the etiology.  She continues to have GI upset in the AM.  She went to her PCP and she was given Zofran and states that it made it worse but she only took it once.  It is only the AM when she has nausea and she does better the rest of the day.    12/14/14 update:  The patient returns today, accompanied by her husband who supplements the history.  Records were reviewed since last visit.  She has a history of breast cancer and was having weight loss/anorexia and saw her oncologist.  She ended up having a CT chest, abdomen and pelvis and those were unremarkable for any new lesions.  She has lost about 13 pounds since March of last year.  She has been complaining about nausea the last several visits.  In fact, we've previously stopped her entacapone to see if that would help the nausea, but it did not.  She did not think that it was related at all to her levodopa.  She no longer has the nausea but in the AM she doesn't feel good.  She has difficulty describing what this means.  She remains on carbidopa/levodopa 25/100, 1 tablet at 9 AM/one tablet at 1 PM and 2 tablets at 6 PM.  She is still on entacapone, 200 mg 3 times a day.  They brought with them a list of blood pressures and while the SBP is generally high (170's-190's without med and 150's-180's with BP med) it was never over 200.  She states that she was off the BP med a few days and felt better but when she went  back on it she felt worse again.  She was told to go back on it because it her BP's were too high.  She is only on amlodipine 2.5 mg.    03/05/15 update:  The patient is following up today regarding her Parkinson's disease.  She is currently on carbidopa/levodopa 25/100, 1 tablet at 9 AM, 1 tablet at 1 PM and 2 tablets at 6 PM.  She takes entacapone with each of these dosages.  At last visit, she was very focused on her blood pressure and I asked her to follow-up with her primary care physician to see if she actually needed her blood pressure medication.  Today, the patient and her husband state that she was able to d/c her amlodipine and her BP has been well controlled. She states that the nausea is better.  She states that she gets up in the AM and takes her med and just feels jittery and anxious.  Her husband asks about trying to take a levodopa at bedtime to see if that helps.  When she takes her xanax, the anxiety does seem to get better.  They were thinking about asking PCP to see if she can go up on the medication.  Husband admits that balance sometimes off and cognition not great.  PT did not think that she needed walker per their hx.  She has gotten back to her exercise.   By 3-4 pm, pt feels well.    06/11/15 update:  The patient presents today for follow-up, accompanied by her husband who supplements the history.  She is on carbidopa/levodopa 25/100, 1 tablet at 9 AM, 1 tablet at 1 PM and 2 tablets at 6 PM.  She is on entacapone  200 mg 3 times a day and last visit we added carbidopa/levodopa 50/200 at night.  She didn't notice a big difference with that. Her husband isn't sure that she really gave it enough time to know.   Her biggest issue last visit was her anxiety and I told her to try an extra levodopa when she is anxious to see if that helps.  They tried that and it didn't help.    I did tell her that I did not want her taking daily Xanax.  Today, the patient states that the "only thing that helps  is xanax."   Unfortunately, she has not been exercising.  Her husband is undergoing tx daily for prostate CA and they are consumed with that.  Balance has been okay and her husband states that she is doing well in that regard.  No falls.  She is sleeping from 9pm until 5-6am. She is taking naps during the day.   09/11/15 update:  The patient follows up today, accompanied by her husband who supplements the history.  She is on carbidopa/levodopa 25/100, 1 tablet at 9 AM, 1 at 1 PM and 2 tablets at 6 PM.  I asked her to retry carbidopa/levodopa 50/200 at night last visit.  She is still on the entacapone 200 mg 3 times per day.  Last visit, I encouraged her to discontinue her daily Xanax and we started her on Wellbutrin XL for her anxiety.  Her husband thinks that she is still on it (and off of xanax) but wellbutrin is not on her medication list today.  Anxiety has been better per both patient and husband.  Husband states that Dr. Kenton Kingfisher told them to ask me about sertraline.  She was going to come in and talk to me on January 11, but ended up having increased hallucinations and was admitted to the hospital from January 11 to 08/11/2015.  She was treated for urinary tract infection although her urine culture ended up being negative.  Husband reports that she is 90-95% better from a mental standpoint.  No hallucinations since d/c from the hospital.  Doing home PT/OT/ST and nurse comes once per week.    01/09/16 update:  The patient follows up today, accompanied by her husband who supplements the history.  She is on carbidopa/levodopa 25/100, 1 tablet at 9 AM, 1 at 1 PM and 2 tablets at 6 PM.  For some reason, her carbidopa/levodopa 50/200 got accidentally stopped, and I told them to just go ahead and hold that for now.  She is still on the entacapone 200 mg 3 times per day.  Last visit, her husband was not sure if she was still on the Wellbutrin and ended up calling me back.  This was stopped during her hospitalization.   She, therefore, started on Zoloft instead right after our last visit.  She took a single 25 mg tablet and felt nauseated, but I reminded her that she feels nauseated a lot in the morning and asked her to retry it, telling her that she could take it before bedtime and then work up to 50 mg at night.  She is only on 25 mg and husband states that she is doing well and anxiety is better.  Only time an issue is that she hates to get in a car and travel.    Nausea is also better.    05/13/16 update:  The patient follows up today, accompanied by her husband who supplements the history.  She is on  carbidopa/levodopa 25/100, 1 tablet at 9 AM, 1 at 1 PM and 2 tablets at 6 PM.  She is on entacapone 200 mg 3 times a day with each of these dosages.  For the anxiety and depression, she is on Zoloft which has been increased to 50 mg daily.  That has helped.  She was hospitalized from 9/18-9/22 for CAP.  The records that were made available to me were reviewed. Having a facet joint injection tomorrow by Dr. Jacelyn Grip for back pain.  Not been able to get back to exercise because of pneumonia and back pain.  Has PD screen on 11/28.  08/19/16 update:  The patient follows up today, accompanied by her husband who supplements the history.  She is on carbidopa/levodopa 25/100, 1 tablet at 9 AM, 1 at 1 PM and 2 tablets at 6 PM.  She is on entacapone 200 mg 3 times a day with each of these dosages.  For the anxiety and depression, she is on Zoloft which has been increased to 50 mg daily.  Husband states that "she is not as happy as the gal I married."  The records that were made available to me were reviewed.  Pt denies falls.  Pt denies lightheadedness, near syncope.  No hallucinations.  Husband states that yesterday was a really bad day both physically and mentally (more confused and had help getting dressed).  Is much better today.  Appetite is overall down.  Drinking plenty of water.  Not exercising.  Is sleeping a lot per husband.  Husband states that was having back pain.  Went and had rhizotomy and that helped and then PT ordered but husband wants it at neurorehab center as patient very unsteady.  11/24/16 update: Patient seen today, accompanied by her husband to supplements the history.  Patient remains on carbidopa/levodopa 25/100, one tablet at 1 tablet at 9am, 1 PM and 2 tablets at 6 PM.  She takes entacapone 200 mg 3 times per day.  She is on sertraline, 50 mg daily.  She still suffers from rather significant anxiety.  She had one fall but they aren't sure if since last visit but thinks it was.  No hallucinations.  She has been attending outpatient Parkinson's therapies and I have reviewed those records.     Neuroimaging has  previously been performed.  An MRI brain was last done in 2010 but pt can no longer have MRI's because of an implantable hearing device.    PREVIOUS MEDICATIONS: none to date  ALLERGIES:  No Known Allergies  CURRENT MEDICATIONS:  Current Outpatient Prescriptions on File Prior to Visit  Medication Sig Dispense Refill  . acetaminophen (TYLENOL) 325 MG tablet Take 650 mg by mouth every 6 (six) hours as needed for moderate pain.     [name] acyclovir (ZOVIRAX) 400 MG tablet TAKE 1 TABLET BY MOUTH TWICE DAILY 180 tablet 3  . bisacodyl (DULCOLAX) 5 MG EC tablet Take 5 mg by mouth daily as needed for moderate constipation.    . Calcium Carbonate-Vitamin D3 (CALCIUM 600-D) 600-400 MG-UNIT TABS Take 1 tablet by mouth at bedtime.    . carbidopa-levodopa (SINEMET IR) 25-100 MG tablet 1 tablet in the morning by mouth, 1 in the afternoon by mouth, 2 in the evening by mouth. 360 tablet 1  . cetirizine (ZYRTEC) 10 MG tablet Take 10 mg by mouth daily at 12 noon.     . cholecalciferol (VITAMIN D) 1000 units tablet Take 1,000 Units by mouth every evening.     [name]  entacapone (COMTAN) 200 MG tablet Take 200 mg by mouth 3 (three) times daily. TAKE 1 TABLET (200 MG TOTAL) BY MOUTH 3 (THREE) TIMES DAILY - take with Sinemet     . Fluticasone-Salmeterol (ADVAIR) 250-50 MCG/DOSE AEPB Inhale 1 puff into the lungs 2 (two) times daily.    . meloxicam (MOBIC) 15 MG tablet Take 15 mg by mouth daily.    . mirabegron ER (MYRBETRIQ) 50 MG TB24 tablet Take 50 mg by mouth daily at 12 noon. Reported on 01/30/2016    . Multiple Vitamins-Minerals (MULTIVITAMIN WITH MINERALS) tablet Take 1 tablet by mouth daily with breakfast.     . sertraline (ZOLOFT) 25 MG tablet Take 25 mg by mouth at bedtime.     No current facility-administered medications on file prior to visit.     PAST MEDICAL HISTORY:   Past Medical History:  Diagnosis Date  . Anxiety   . Asthma   . Depression   . Diabetes mellitus without complication ([place])    9/60/45.Marland KitchenMarland Kitchenpt denies  . GERD (gastroesophageal reflux disease)   . Hearing loss   . NHL (non-Hodgkin's lymphoma) ([place])    nhl dx 9/04 breast ca dx1/12  . Parkinson disease ([place])     PAST SURGICAL HISTORY:   Past Surgical History:  Procedure Laterality Date  . Ba-HA Ear implant    . MASTECTOMY  2 /8/ 12   bilateral  . MASTECTOMY      SOCIAL HISTORY:   Social History   Social History  . Marital status: Married    Spouse name: N/A  . Number of children: N/A  . Years of education: N/A   Occupational History  . retired Retired    Pharmacist, hospital   Social History Main Topics  . Smoking status: Never Smoker  . Smokeless tobacco: Never Used     Comment: husband wsa smoker  . Alcohol use No     Comment: no  . Drug use: No  . Sexual activity: Not on file   Other Topics Concern  . Not on file   Social History Narrative  . No narrative on file    FAMILY HISTORY:   Family Status  Relation Status  . Father Deceased   CA, lung  . Mother Deceased   CA; complications of splenectomy  . Brother Deceased   3, renal failure, DM-2; CAD; CA  . Child Alive   5 (4 biological), one with PKU    ROS:  A complete 10 system review of systems was obtained and was unremarkable apart from what is  mentioned above.  PHYSICAL EXAMINATION:    VITALS:   Vitals:   11/24/16 1111  BP: 112/60  Pulse: 84  SpO2: 98%  Weight: 126 lb (57.2 kg)  Height: 5\' 2"  (1.575 m)   Wt Readings from Last 3 Encounters:  11/24/16 126 lb (57.2 kg)  11/17/16 126 lb 12.8 oz (57.5 kg)  09/15/16 126 lb 3.2 oz (57.2 kg)     GEN:  The patient appears stated age and is in NAD. CV:  RRR with 3/6 SEM Lungs:  CTAB Neck:  No bruits but cardiac murmur radiates to the bilateral carotids   Neurological examination:  Orientation: She is alert and oriented to person and place.   Montreal Cognitive Assessment  05/13/2016 09/11/2015 12/14/2014  Visuospatial/ Executive (0/5) 3 3 3   Naming (0/3) 2 3 3   Attention: Read list of digits (0/2) 2 1 2   Attention: Read list of letters (0/1) 1 1 1  Attention: Serial 7 subtraction starting at 100 (0/3) 0 0 0  Language: Repeat phrase (0/2) 1 0 1  Language : Fluency (0/1) 1 1 1   Abstraction (0/2) 1 1 1   Delayed Recall (0/5) 0 0 0  Orientation (0/6) 5 6 6   Total 16 16 18   Adjusted Score (based on education) 17 17 19    Cranial nerves: There is good facial symmetry.  The visual fields are full to confrontational testing. The speech is fluent and clear.  She is hypophonic.  Soft palate rises symmetrically and there is no tongue deviation. Hearing is intact to conversational tone. Sensation: Sensation is intact to light outh throughout. Motor: Strength is 5/5 in the bilateral upper and lower extremities.   Shoulder shrug is equal and symmetric.  There is no pronator drift.   Movement examination: Tone: There is mild rigidity in the RUE Abnormal movements: There is rare tremor in RUE/L fingers. Coordination:  There is no decremation noted today Gait and Station: The patient has minimal trouble getting OOC without the use of the hands.  Stride length and arm swing are good.  She has pisa syndrome.  She walks with and without the cane and is more confident with the cane.     Labs:  Lab Results  Component Value Date   WBC 7.0 08/06/2016   HGB 10.8 (L) 08/06/2016   HCT 32.7 (L) 08/06/2016   MCV 88.4 08/06/2016   PLT 215 08/06/2016     Chemistry      Component Value Date/Time   NA 143 08/06/2016 1149   K 4.5 08/06/2016 1149   CL 107 04/16/2016 0550   CL 106 12/26/2012 1110   CO2 29 08/06/2016 1149   BUN 15.7 08/06/2016 1149   CREATININE 1.1 08/06/2016 1149      Component Value Date/Time   CALCIUM 9.9 08/06/2016 1149   ALKPHOS 127 08/06/2016 1149   AST 19 08/06/2016 1149   ALT 9 08/06/2016 1149   BILITOT 0.29 08/06/2016 1149      Lab Results  Component Value Date   CHYIFOYD74 1287 (H) 06/10/2006   Lab Results  Component Value Date   TSH 2.93 09/19/2012    ASSESSMENT/PLAN:  1.  Idiopathic Parkinson's disease, dx 08/2012.  This is evidenced by bradykinesia, tremor, postural instability and rigidity.  There are no atypical features.  -We discussed the diagnosis as well as pathophysiology of the disease.  We discussed treatment options as well as prognostic indicators.  Patient education was provided.  - she is currently on carbidopa/levodopa 25/100 one tablet at 9 AM/one tablet at 1 PM and 2 tablets at 6 PM and will continue this.  She needs to avoid protein when taking levodopa and discussed this.  Risks, benefits, side effects and alternative therapies were discussed.  The opportunity to ask questions was given and they were answered to the best of my ability.  The patient expressed understanding and willingness to follow the outlined treatment protocols.  -Pt is on entacapone tid and she will continue on this.    -was supposed to restart carbidopa/levodopa 50/200 q hs previously but didn't and decided to hold on that for now.  -continue PT/OT/ST 2.  Anxiety  -doing well on zoloft 50 mg at night. They are going to ask prescribing physician.    3.  Cardiac murmur  -being followed yearly by cardiology for mod AS 4.  PDD  -dont want to add  further meds right now.  Is free of hallucinations but  if occur again, will consider nuplazid.   -told her she needs to follow a regular schedule 5. Follow up is anticipated in the next few months, sooner should new neurologic issues arise.  Much greater than 50% of this visit was spent in counseling and coordinating care.  Total face to face time:  25 min

## 2016-11-23 ENCOUNTER — Encounter: Payer: Medicare Other | Admitting: Occupational Therapy

## 2016-11-24 ENCOUNTER — Encounter: Payer: Self-pay | Admitting: Neurology

## 2016-11-24 ENCOUNTER — Ambulatory Visit (INDEPENDENT_AMBULATORY_CARE_PROVIDER_SITE_OTHER): Payer: Medicare Other | Admitting: Neurology

## 2016-11-24 VITALS — BP 112/60 | HR 84 | Ht 62.0 in | Wt 126.0 lb

## 2016-11-24 DIAGNOSIS — G20 Parkinson's disease: Secondary | ICD-10-CM | POA: Diagnosis not present

## 2016-11-24 DIAGNOSIS — F411 Generalized anxiety disorder: Secondary | ICD-10-CM

## 2016-11-25 ENCOUNTER — Ambulatory Visit: Payer: Medicare Other | Attending: Neurology

## 2016-11-25 ENCOUNTER — Ambulatory Visit: Payer: Medicare Other | Admitting: Occupational Therapy

## 2016-11-25 DIAGNOSIS — R29818 Other symptoms and signs involving the nervous system: Secondary | ICD-10-CM | POA: Diagnosis not present

## 2016-11-25 DIAGNOSIS — R471 Dysarthria and anarthria: Secondary | ICD-10-CM

## 2016-11-25 DIAGNOSIS — M6281 Muscle weakness (generalized): Secondary | ICD-10-CM

## 2016-11-25 DIAGNOSIS — R278 Other lack of coordination: Secondary | ICD-10-CM

## 2016-11-25 DIAGNOSIS — R293 Abnormal posture: Secondary | ICD-10-CM | POA: Insufficient documentation

## 2016-11-25 DIAGNOSIS — R2681 Unsteadiness on feet: Secondary | ICD-10-CM | POA: Insufficient documentation

## 2016-11-25 DIAGNOSIS — R41841 Cognitive communication deficit: Secondary | ICD-10-CM | POA: Diagnosis not present

## 2016-11-25 DIAGNOSIS — R41844 Frontal lobe and executive function deficit: Secondary | ICD-10-CM

## 2016-11-25 DIAGNOSIS — R2689 Other abnormalities of gait and mobility: Secondary | ICD-10-CM | POA: Diagnosis not present

## 2016-11-25 NOTE — Patient Instructions (Addendum)
   Do 5 loud "ah", twice per day. If you feel it is weak, shout "HEY HEY HEY!" a few times, then try your loud "ah" again.  Practice the speech tasks (stapled packet) for 20 minutes 4-5 times each week.

## 2016-11-25 NOTE — Therapy (Signed)
[facility] [address] [place], [name], [address] Phone: [phone]   Fax:  [phone]  Occupational Therapy Treatment  Patient Details  Name: [name] MRN: [id number] Date of Birth: 15-Aug-1927 Referring Provider: Dr. [name]  Encounter Date: 11/25/2016      OT End of Session - 11/25/16 1557    Visit Number 6   Number of Visits 17   Date for OT Re-Evaluation 12/20/16   Authorization Type MCR, UHC secondary   Authorization - Visit Number 6   Authorization - Number of Visits 10   OT Start Time 1020   OT Stop Time 1100   OT Time Calculation (min) 40 min   Activity Tolerance Patient tolerated treatment well   Behavior During Therapy [facility] for tasks assessed/performed      Past Medical History:  Diagnosis Date  . Anxiety   . Asthma   . Depression   . Diabetes mellitus without complication ([place])    02/12/93.Marland KitchenMarland Kitchenpt denies  . GERD (gastroesophageal reflux disease)   . Hearing loss   . NHL (non-Hodgkin's lymphoma) ([place])    nhl dx 9/04 breast ca dx1/12  . Parkinson disease [facility])     Past Surgical History:  Procedure Laterality Date  . Ba-HA Ear implant    . MASTECTOMY  2 /8/ 12   bilateral  . MASTECTOMY      There were no vitals filed for this visit.      Subjective Assessment - 11/25/16 1026    Subjective  Pt reports back pain   Pertinent History Lumbar radiofrequency ablation (to treat lumbar facet joint pain)-last one 08/27/16 with relief of back pain; dementia due to Parkinson's; DVT (07/2016)   Patient Stated Goals improve independence with daily activities   Currently in Pain? Yes   Pain Score 4    Pain Location Back   Pain Orientation Right   Pain Descriptors / Indicators Sore   Pain Type Chronic pain   Pain Onset More than a month ago   Pain Frequency Intermittent   Aggravating Factors  malpositioning   Pain Relieving Factors repositioning   Multiple Pain Sites No            Reviewed bag exercises for simulated ADLS  from HEP, pt required min-mod v.c. For larger amplitude movements and performance. Cane exercises for shoulder flexion, min v.c. Therapist checked box/ blocks and RUE shoulder flexion short term goals, see pt short term goals.                     OT Short Term Goals - 11/25/16 1030      OT SHORT TERM GOAL #1   Title I with PD specific HEP .- 11/20/16   Time 4   Period Weeks   Status On-going  needs min-mod v.c for bag exercises, will need to educate pt's husband     OT [place] #2   Title Pt will demonstrate understanding of adapted strategies for ADLs/IADLs.   Time 4   Period Weeks   Status On-going  needs reinforcement     OT SHORT TERM GOAL #3   Title Pt will demonstrate ability to write a short paragraph with 100% legibility and only minimal decrease in letter size.   Time 4   Period Weeks   Status Achieved  11/16/16     OT SHORT TERM GOAL #4   Title Pt will demonstrate improved bilateral UE functional use as evidenced by increasing box/  blocks score by 3 blocks.   Time 4   Period Weeks   Status Achieved  RUE 49, LUE 50     OT SHORT TERM GOAL #5   Title Pt will increase right shoulder flexion to 125* with -15 elbow ext to retrieve a lightweight object.   Baseline sh flexion: 120, elbow ext -20   Time 4   Period Weeks   Status Achieved  125,-15           OT Long Term Goals - 10/28/16 1207      OT LONG TERM GOAL #1   Title Pt will demonstrate improved safety for ADLs as evidenced by increasing bilateral standing functional reach to 10 inches or greater.- due 10/19/16   Time 8   Period Weeks   Status New     OT LONG TERM GOAL #2   Title Pt will improve R dominant hand coordination for ADLs as shown by improving 9-hole peg test score by at least 3sec.    Baseline R 31.41 secs, LUE 30.32 secs   Time 8   Period Weeks   Status New     OT LONG TERM GOAL #3   Title Pt will  demonstrate improved ease with fastening buttons as evidenced by decreasing 3 button/ unbutton to 32 secs or less.   Baseline 36.72 secs   Time 8   Period Weeks   Status New     OT LONG TERM GOAL #4   Title Pt will verbalize understanding of memory, visual compensation strategies for ADLs prn.   Time 8   Period Weeks   Status New     OT LONG TERM GOAL #5   Title --------------------------------------------------------------------------------------------------------               Plan - 11/25/16 1539    Clinical Impression Statement Pt demonstrates improved bilateral functional use as evidenced by improved  performance on box/ blocks.   Rehab Potential Good   OT Frequency 2x / week   OT Duration 8 weeks   OT Treatment/Interventions Self-care/ADL training;DME and/or AE instruction;Splinting;Patient/family education;Balance training;Therapeutic exercises;Ultrasound;Therapeutic exercise;Therapeutic activities;Cognitive remediation/compensation;Passive range of motion;Neuromuscular education;Cryotherapy;Electrical Stimulation;Parrafin;Energy conservation;Manual Therapy;Visual/perceptual remediation/compensation   Plan adapted strategies for ADLs.   Consulted and Agree with Plan of Care Patient      Patient will benefit from skilled therapeutic intervention in order to improve the following deficits and impairments:  Abnormal gait, Decreased cognition, Impaired flexibility, Pain, Decreased mobility, Decreased coordination, Decreased activity tolerance, Decreased endurance, Decreased range of motion, Decreased strength, Impaired UE functional use, Difficulty walking, Decreased safety awareness, Decreased knowledge of precautions, Decreased balance  Visit Diagnosis: Other symptoms and signs involving the nervous system  Muscle weakness (generalized)  Other lack of coordination  Frontal lobe and executive function deficit  Other abnormalities of gait and mobility    Problem  List Patient Active Problem List   Diagnosis Date Noted  . CAP (community acquired pneumonia) 04/13/2016  . PNA (pneumonia) 04/13/2016  . Murmur 09/02/2015  . Parkinson disease ([place]) 09/02/2015  . Bilateral carotid bruits 09/02/2015  . Confusion 08/07/2015  . Acute encephalopathy 08/07/2015  . UTI (lower urinary tract infection) 08/07/2015  . Acute kidney failure ([place]) 08/07/2015  . Paralysis agitans ([place]) 09/19/2012  . Lymphoma ([place]) 07/03/2011  . Malignant neoplasm of female breast ([place]) 07/03/2011  . Abnormal CT of the chest 03/13/2011  . HYPERTENSION, BENIGN 01/01/2009  . EDEMA 01/01/2009    Normon Pettijohn 11/25/2016, 3:58 PM Theone Murdoch, OTR/L [phone] Phone: [phone] 4:01 PM 11/25/16 [facility] [address] [place], [name], [address] Phone: [phone]   Fax:  [phone]  Name: Andretta Ergle MRN: [id number] Date of Birth: 23-May-1928

## 2016-11-25 NOTE — Therapy (Signed)
[facility] [address] [place], [name], [address] Phone: [phone]   Fax:  [phone]  Speech Language Pathology Treatment  Patient Details  Name: [name] MRN: [id number] Date of Birth: August 17, 1927 Referring Provider: Alonza Bogus, D.O.  Encounter Date: 11/25/2016      End of Session - 11/25/16 1643    Visit Number 3   Number of Visits 17   Date for SLP Re-Evaluation 12/25/16   SLP Start Time 1103   SLP Stop Time  1146   SLP Time Calculation (min) 43 min   Activity Tolerance Patient tolerated treatment well      Past Medical History:  Diagnosis Date  . Anxiety   . Asthma   . Depression   . Diabetes mellitus without complication ([place])    11/11/38.Marland KitchenMarland Kitchenpt denies  . GERD (gastroesophageal reflux disease)   . Hearing loss   . NHL (non-Hodgkin's lymphoma) ([place])    nhl dx 9/04 breast ca dx1/12  . Parkinson disease [facility])     Past Surgical History:  Procedure Laterality Date  . Ba-HA Ear implant    . MASTECTOMY  2 /8/ 12   bilateral  . MASTECTOMY      There were no vitals filed for this visit.      Subjective Assessment - 11/25/16 1122    Subjective "Should I talk loud now?" Pt, during structured tasks in ST.    Currently in Pain? Yes   Pain Score 4    Pain Location Back   Pain Orientation Right   Pain Descriptors / Indicators Sore   Pain Type Chronic pain   Pain Onset More than a month ago   Pain Frequency Intermittent               ADULT SLP TREATMENT - 11/25/16 1124      General Information   Behavior/Cognition Alert;Cooperative;Pleasant mood;Confused;Hard of hearing     Treatment Provided   Treatment provided Cognitive-Linquistic     Cognitive-Linquistic Treatment   Treatment focused on Dysarthria;Patient/family/caregiver education   Skilled Treatment Pt began loud /a/ with average upper 70s dB with minimal abdominal push. SLP assisted pt with her loud /a/ to make it  more effective by pt shouting "HEY HEY HEY!" with tactile cues from hands on abdominal musculature. Pt then produced lower 90s average loud /a/ with reduced vocal quality. Pt continually clearing her throat during session, cued pt for taking sips of water but pt cont'd to clear throat despite SLP occasional cues to drink H2O instead. In strucutred sentence and multisentence tasks, pt maintained speech WNL with usual min-mod A from SLP. After session, SLP reviewed pt's loud /a/ and ST practice regimen (need to perform "HEY HEY HEY" PRN, and practice louder voice in structured tasks 4 days a week for 20-30 minutes) with husband     Assessment / Recommendations / [place] with current plan of care     Progression Toward Goals   Progression toward goals Progressing toward goals  pt will need to practice routinely          SLP Education - 11/25/16 1643    Education provided Yes   Education Details need to practice ST exercises 4 days/week, and loud /a/ every day with "HEY HEY HEY" PRN   Person(s) Educated Patient;Spouse   Methods Explanation;Demonstration;Verbal cues;Handout   Comprehension Verbalized understanding;Returned demonstration;Verbal cues required;Need further instruction          SLP Short Term Goals -  11/25/16 1645      SLP SHORT TERM GOAL #1   Title Pt will demonstrate sustained /a/ of 85dB with rare min A over 3 sessions   Time 3   Period Weeks   Status On-going     SLP SHORT TERM GOAL #2   Title Pt will maintain average of 70dB during structured speech tasks with occassional min A over 3 sessions    Time 3   Period Weeks   Status On-going          SLP Long Term Goals - 11/25/16 1645      SLP LONG TERM GOAL #1   Title Maintain an average of 70dB during conversation over 10 minutes with rare min A over two sessions    Time 7   Period Weeks   Status On-going     SLP LONG TERM GOAL #2   Title Converse audilbly in a noisy environment for 8 minutes  with rare min A    Time 7   Period Weeks   Status On-going     SLP LONG TERM GOAL #3   Title pt will report husband asking less for her to repeat, than before ST   Time 7   Period Weeks   Status On-going          Plan - 11/25/16 1644    Clinical Impression Statement Pt's speech volume due to Parkinson's disease remains suboptimal resulting in difficulty communicating with family and people in the community. Pt reduced cognitive linguistic skills complicate overall progress in therapy. Pt cont to report that her husband has difficulty hearing her. She would benefit from skilled ST focusing on increasing her conversational loudness.   Speech Therapy Frequency 2x / week   Duration --  8 weeks   Treatment/Interventions Compensatory strategies;Patient/family education;Functional tasks;Cueing hierarchy;SLP instruction and feedback;Internal/external aids;Cognitive reorganization  any or all may be used   Potential to Achieve Goals Fair   Potential Considerations Previous level of function;Severity of impairments   Consulted and Agree with Plan of Care Patient;Family member/caregiver   Family Member Consulted husband      Patient will benefit from skilled therapeutic intervention in order to improve the following deficits and impairments:   Dysarthria and anarthria  Cognitive communication deficit    Problem List Patient Active Problem List   Diagnosis Date Noted  . CAP (community acquired pneumonia) 04/13/2016  . PNA (pneumonia) 04/13/2016  . Murmur 09/02/2015  . Parkinson disease ([place]) 09/02/2015  . Bilateral carotid bruits 09/02/2015  . Confusion 08/07/2015  . Acute encephalopathy 08/07/2015  . UTI (lower urinary tract infection) 08/07/2015  . Acute kidney failure ([place]) 08/07/2015  . Paralysis agitans ([place]) 09/19/2012  . Lymphoma ([place]) 07/03/2011  . Malignant neoplasm of female breast ([place]) 07/03/2011  . Abnormal CT of the chest 03/13/2011  . HYPERTENSION, BENIGN  01/01/2009  . EDEMA 01/01/2009    [facility] ,[place], CCC-SLP  11/25/2016, 4:47 PM  [facility] [address] [place], [name], [address] Phone: [phone]   Fax:  [phone]   Name: [name] MRN: [id number] Date of Birth: 1928-05-13

## 2016-11-27 ENCOUNTER — Ambulatory Visit: Payer: Medicare Other | Admitting: Occupational Therapy

## 2016-11-27 ENCOUNTER — Ambulatory Visit: Payer: Medicare Other

## 2016-11-27 DIAGNOSIS — R41844 Frontal lobe and executive function deficit: Secondary | ICD-10-CM

## 2016-11-27 DIAGNOSIS — R41841 Cognitive communication deficit: Secondary | ICD-10-CM | POA: Diagnosis not present

## 2016-11-27 DIAGNOSIS — R471 Dysarthria and anarthria: Secondary | ICD-10-CM | POA: Diagnosis not present

## 2016-11-27 DIAGNOSIS — M6281 Muscle weakness (generalized): Secondary | ICD-10-CM

## 2016-11-27 DIAGNOSIS — R29818 Other symptoms and signs involving the nervous system: Secondary | ICD-10-CM

## 2016-11-27 DIAGNOSIS — R293 Abnormal posture: Secondary | ICD-10-CM

## 2016-11-27 DIAGNOSIS — R278 Other lack of coordination: Secondary | ICD-10-CM | POA: Diagnosis not present

## 2016-11-27 NOTE — Therapy (Signed)
[facility] [address] [place], [name], [address] Phone: [phone]   Fax:  [phone]  Speech Language Pathology Treatment  Patient Details  Name: [name] MRN: [id number] Date of Birth: 08-17-1927 Referring Provider: Alonza Bogus, D.O.  Encounter Date: 11/27/2016      End of Session - 11/27/16 1445    Visit Number 4   Number of Visits 17   Date for SLP Re-Evaluation 12/25/16   SLP Start Time 1149   SLP Stop Time  1230   SLP Time Calculation (min) 41 min   Activity Tolerance Patient tolerated treatment well      Past Medical History:  Diagnosis Date  . Anxiety   . Asthma   . Depression   . Diabetes mellitus without complication ([place])    4/66/59.Marland KitchenMarland Kitchenpt denies  . GERD (gastroesophageal reflux disease)   . Hearing loss   . NHL (non-Hodgkin's lymphoma) ([place])    nhl dx 9/04 breast ca dx1/12  . Parkinson disease [facility])     Past Surgical History:  Procedure Laterality Date  . Ba-HA Ear implant    . MASTECTOMY  2 /8/ 12   bilateral  . MASTECTOMY      There were no vitals filed for this visit.      Subjective Assessment - 11/27/16 1151    Subjective Pt with extraneous schedule sheets in her folder - SLP removed them.   Currently in Pain? Yes   Pain Score 3    Pain Location Back   Pain Orientation Right   Pain Descriptors / Indicators Aching   Pain Type Chronic pain   Pain Onset More than a month ago   Pain Frequency Intermittent   Aggravating Factors  bending over   Pain Relieving Factors sitting, reclining               ADULT SLP TREATMENT - 11/27/16 1155      General Information   Behavior/Cognition Alert;Cooperative;Pleasant mood;Confused;Hard of hearing     Treatment Provided   Treatment provided Cognitive-Linquistic     Cognitive-Linquistic Treatment   Treatment focused on Dysarthria;Patient/family/caregiver education   Skilled Treatment Pt began loud /a/ with  average upper 70s/low 80s dB, with minimal abdominal push. SLP reveiwed pt's instrcutions provided Wednesday to do "HEY HEY HEY!" to obtain the abdominal push necessary to make loud /a/. Pt did so withexcellent success. Pt then produced lower 90s average loud /a/ with occasional reduced vocal quality. In more complex verbal tasks (telling SLP coin combinations for simple change) pt req'd usual cues for loudness. Pt had no clearing of her throat during today's session. In more simple structured linguistic tasks pt req'd occasional cues from SLP for loudness.      Assessment / Recommendations / Plan   Plan Continue with current plan of care     Progression Toward Goals   Progression toward goals Progressing toward goals            SLP Short Term Goals - 11/27/16 1445      SLP SHORT TERM GOAL #1   Title Pt will demonstrate sustained /a/ of 85dB with rare min A over 3 sessions   Time 3   Period Weeks   Status On-going     SLP SHORT TERM GOAL #2   Title Pt will maintain average of 70dB during structured speech tasks with occassional min A over 3 sessions    Time 3   Period Weeks   Status On-going  SLP Long Term Goals - 11/27/16 1446      SLP LONG TERM GOAL #1   Title Maintain an average of 70dB during conversation over 10 minutes with rare min A over two sessions    Time 7   Period Weeks   Status On-going     SLP LONG TERM GOAL #2   Title Converse audilbly in a noisy environment for 8 minutes with rare min A    Time 7   Period Weeks   Status On-going     SLP LONG TERM GOAL #3   Title pt will report husband asking less for her to repeat, than before ST   Time 7   Period Weeks   Status On-going          Plan - 11/27/16 1445    Clinical Impression Statement Pt's speech volume due to Parkinson's disease remains suboptimal resulting in difficulty communicating with family and people in the community. Pt reduced cognitive linguistic skills complicate overall  progress in therapy. Pt cont to report that her husband has difficulty hearing her. She would benefit from skilled ST focusing on increasing her conversational loudness.   Speech Therapy Frequency 2x / week   Duration --  8 weeks   Treatment/Interventions Compensatory strategies;Patient/family education;Functional tasks;Cueing hierarchy;SLP instruction and feedback;Internal/external aids;Cognitive reorganization  any or all may be used   Potential to Achieve Goals Fair   Potential Considerations Previous level of function;Severity of impairments   Consulted and Agree with Plan of Care Patient;Family member/caregiver   Family Member Consulted husband      Patient will benefit from skilled therapeutic intervention in order to improve the following deficits and impairments:   Dysarthria and anarthria  Cognitive communication deficit    Problem List Patient Active Problem List   Diagnosis Date Noted  . CAP (community acquired pneumonia) 04/13/2016  . PNA (pneumonia) 04/13/2016  . Murmur 09/02/2015  . Parkinson disease ([place]) 09/02/2015  . Bilateral carotid bruits 09/02/2015  . Confusion 08/07/2015  . Acute encephalopathy 08/07/2015  . UTI (lower urinary tract infection) 08/07/2015  . Acute kidney failure ([place]) 08/07/2015  . Paralysis agitans ([place]) 09/19/2012  . Lymphoma ([place]) 07/03/2011  . Malignant neoplasm of female breast ([place]) 07/03/2011  . Abnormal CT of the chest 03/13/2011  . HYPERTENSION, BENIGN 01/01/2009  . EDEMA 01/01/2009    [facility] ,[place], CCC-SLP  11/27/2016, 2:46 PM  [facility] [address] [place], [name], [address] Phone: [phone]   Fax:  [phone]   Name: [name] MRN: [id number] Date of Birth: 27-Dec-1927

## 2016-11-27 NOTE — Therapy (Signed)
[facility] [address] [place], [name], [address] Phone: [phone]   Fax:  [phone]  Occupational Therapy Treatment  Patient Details  Name: [name] MRN: [id number] Date of Birth: 07-Apr-1928 Referring Provider: Dr. [name]  Encounter Date: 11/27/2016      OT End of Session - 11/27/16 1143    Visit Number 7   Number of Visits 17   Date for OT Re-Evaluation 12/20/16   Authorization Type MCR, UHC secondary   Authorization - Visit Number 7   Authorization - Number of Visits 10   OT Start Time 1105   OT Stop Time 1145   OT Time Calculation (min) 40 min   Activity Tolerance Patient tolerated treatment well   Behavior During Therapy [facility] for tasks assessed/performed      Past Medical History:  Diagnosis Date  . Anxiety   . Asthma   . Depression   . Diabetes mellitus without complication ([place])    8/41/66.Marland KitchenMarland Kitchenpt denies  . GERD (gastroesophageal reflux disease)   . Hearing loss   . NHL (non-Hodgkin's lymphoma) ([place])    nhl dx 9/04 breast ca dx1/12  . Parkinson disease [facility])     Past Surgical History:  Procedure Laterality Date  . Ba-HA Ear implant    . MASTECTOMY  2 /8/ 12   bilateral  . MASTECTOMY      There were no vitals filed for this visit.      Subjective Assessment - 11/27/16 1110    Pertinent History Lumbar radiofrequency ablation (to treat lumbar facet joint pain)-last one 08/27/16 with relief of back pain; dementia due to Parkinson's; DVT (07/2016)   Patient Stated Goals improve independence with daily activities   Currently in Pain? Yes   Pain Score 3    Pain Location Back   Pain Descriptors / Indicators Aching   Pain Type Chronic pain   Pain Onset More than a month ago   Pain Frequency Intermittent            Treatment: Copying small peg design with bilateral UE's for fine motor coordination with a cognitive component, mod v.c/ difficulty for design and min difficulty for  coordination PWR!hands prior to task.                    OT Short Term Goals - 11/25/16 1030      OT SHORT TERM GOAL #1   Title I with PD specific HEP .- 11/20/16   Time 4   Period Weeks   Status On-going  needs min-mod v.c for bag exercises, will need to educate pt's husband     OT [place] #2   Title Pt will demonstrate understanding of adapted strategies for ADLs/IADLs.   Time 4   Period Weeks   Status On-going  needs reinforcement     OT SHORT TERM GOAL #3   Title Pt will demonstrate ability to write a short paragraph with 100% legibility and only minimal decrease in letter size.   Time 4   Period Weeks   Status Achieved  11/16/16     OT SHORT TERM GOAL #4   Title Pt will demonstrate improved bilateral UE functional use as evidenced by increasing box/ blocks score by 3 blocks.   Time 4   Period Weeks   Status Achieved  RUE 49, LUE 50     OT SHORT TERM GOAL #5   Title Pt will increase right shoulder flexion to 125* with -  15 elbow ext to retrieve a lightweight object.   Baseline sh flexion: 120, elbow ext -20   Time 4   Period Weeks   Status Achieved  125,-15           OT Long Term Goals - 10/28/16 1207      OT LONG TERM GOAL #1   Title Pt will demonstrate improved safety for ADLs as evidenced by increasing bilateral standing functional reach to 10 inches or greater.- due 10/19/16   Time 8   Period Weeks   Status New     OT LONG TERM GOAL #2   Title Pt will improve R dominant hand coordination for ADLs as shown by improving 9-hole peg test score by at least 3sec.    Baseline R 31.41 secs, LUE 30.32 secs   Time 8   Period Weeks   Status New     OT LONG TERM GOAL #3   Title Pt will demonstrate improved ease with fastening buttons as evidenced by decreasing 3 button/ unbutton to 32 secs or less.   Baseline 36.72 secs   Time 8   Period Weeks   Status New     OT LONG TERM GOAL #4   Title Pt will verbalize understanding of memory,  visual compensation strategies for ADLs prn.   Time 8   Period Weeks   Status New     OT LONG TERM GOAL #5   Title --------------------------------------------------------------------------------------------------------               Plan - 11/27/16 1139    Clinical Impression Statement Pt is progressing towards goals. she remains limited by cognitive deficits. Pt's husband verbalizes understanding of bag exercises HEP.   Rehab Potential Good   OT Frequency 2x / week   OT Duration 8 weeks   OT Treatment/Interventions Self-care/ADL training;DME and/or AE instruction;Splinting;Patient/family education;Balance training;Therapeutic exercises;Ultrasound;Therapeutic exercise;Therapeutic activities;Cognitive remediation/compensation;Passive range of motion;Neuromuscular education;Cryotherapy;Electrical Stimulation;Parrafin;Energy conservation;Manual Therapy;Visual/perceptual remediation/compensation   Plan continue to work towards unmet goals   Consulted and Agree with Plan of Care Patient      Patient will benefit from skilled therapeutic intervention in order to improve the following deficits and impairments:  Abnormal gait, Decreased cognition, Impaired flexibility, Pain, Decreased mobility, Decreased coordination, Decreased activity tolerance, Decreased endurance, Decreased range of motion, Decreased strength, Impaired UE functional use, Difficulty walking, Decreased safety awareness, Decreased knowledge of precautions, Decreased balance  Visit Diagnosis: Other lack of coordination  Muscle weakness (generalized)  Frontal lobe and executive function deficit  Abnormal posture  Other symptoms and signs involving the nervous system    Problem List Patient Active Problem List   Diagnosis Date Noted  . CAP (community acquired pneumonia) 04/13/2016  . PNA (pneumonia) 04/13/2016  . Murmur 09/02/2015  . Parkinson disease ([place]) 09/02/2015  . Bilateral carotid bruits 09/02/2015   . Confusion 08/07/2015  . Acute encephalopathy 08/07/2015  . UTI (lower urinary tract infection) 08/07/2015  . Acute kidney failure ([place]) 08/07/2015  . Paralysis agitans ([place]) 09/19/2012  . Lymphoma ([place]) 07/03/2011  . Malignant neoplasm of female breast ([place]) 07/03/2011  . Abnormal CT of the chest 03/13/2011  . HYPERTENSION, BENIGN 01/01/2009  . EDEMA 01/01/2009    Koa Zoeller 11/27/2016, 11:44 AM  [facility] [address] [place], [name], [address] Phone: [phone]   Fax:  [phone]  Name: Bill Mcvey MRN: [id number] Date of Birth: 1928/05/22

## 2016-12-01 ENCOUNTER — Ambulatory Visit: Payer: Medicare Other | Admitting: Speech Pathology

## 2016-12-01 ENCOUNTER — Ambulatory Visit: Payer: Medicare Other | Admitting: Occupational Therapy

## 2016-12-01 DIAGNOSIS — R29818 Other symptoms and signs involving the nervous system: Secondary | ICD-10-CM

## 2016-12-01 DIAGNOSIS — R471 Dysarthria and anarthria: Secondary | ICD-10-CM

## 2016-12-01 DIAGNOSIS — R293 Abnormal posture: Secondary | ICD-10-CM

## 2016-12-01 DIAGNOSIS — R41841 Cognitive communication deficit: Secondary | ICD-10-CM

## 2016-12-01 DIAGNOSIS — M6281 Muscle weakness (generalized): Secondary | ICD-10-CM

## 2016-12-01 DIAGNOSIS — R41844 Frontal lobe and executive function deficit: Secondary | ICD-10-CM | POA: Diagnosis not present

## 2016-12-01 DIAGNOSIS — R278 Other lack of coordination: Secondary | ICD-10-CM | POA: Diagnosis not present

## 2016-12-01 NOTE — Patient Instructions (Signed)
   Cognitive Activities you can do at home:   - [place] (easy level)  - [place] magazines, talk about the articles and pictures  Make a daily schedule and keep a big calendar so she can see what's ahead  May benefit from extra time to process tasks, conversations, directions, group conversations    Get the persons attention before you speak  Use eye contact and face the person you are speaking to  Be in close proximity to the person you are speaking to  Turn down any noise in the environment such as the TV, walk away from loud appliances, air conditioners, fans, dish washers etc    Word finding difficulties and cognitive changes are typical with Parkinson's disease

## 2016-12-01 NOTE — Therapy (Signed)
[facility] [address] [place], [name], [address] Phone: [phone]   Fax:  [phone]  Occupational Therapy Treatment  Patient Details  Name: [name] MRN: [id number] Date of Birth: 06-Jan-1928 Referring Provider: Dr. [name]  Encounter Date: 12/01/2016      OT End of Session - 12/01/16 1205    Visit Number 8   Number of Visits 17   Date for OT Re-Evaluation 12/20/16   Authorization Type MCR, UHC secondary   Authorization - Visit Number 8   Authorization - Number of Visits 10   OT Start Time 1151   OT Stop Time 1230   OT Time Calculation (min) 39 min      Past Medical History:  Diagnosis Date  . Anxiety   . Asthma   . Depression   . Diabetes mellitus without complication ([place])    9/67/89.Marland KitchenMarland Kitchenpt denies  . GERD (gastroesophageal reflux disease)   . Hearing loss   . NHL (non-Hodgkin's lymphoma) ([place])    nhl dx 9/04 breast ca dx1/12  . Parkinson disease [facility])     Past Surgical History:  Procedure Laterality Date  . Ba-HA Ear implant    . MASTECTOMY  2 /8/ 12   bilateral  . MASTECTOMY      There were no vitals filed for this visit.      Subjective Assessment - 12/01/16 1159    Subjective  Pt reports back  this a.m but no pain right now   Pertinent History Lumbar radiofrequency ablation (to treat lumbar facet joint pain)-last one 08/27/16 with relief of back pain; dementia due to Parkinson's; DVT (07/2016)   Patient Stated Goals improve independence with daily activities   Currently in Pain? No/denies          Treatment: arm bike x 5 mins level  1 for conditioning, pt maintained 30 rpm Standing for dynamic functional reaching to copy a small peg design with bilateral UE,'s, mod-max v.c for correct design and positioning due to cognitive deficits, pt demonstrated mod difficulty with fine motor coordination. Pt required a seated rest break due to fatigue. Pt required increased time due to  bradykinesia and and increased processing time required for this task.                      OT Short Term Goals - 11/27/16 1301      OT SHORT TERM GOAL #1   Title I with PD specific HEP .- 11/20/16   Time 4   Period Weeks   Status Achieved  needs min v.c for bag exercises, pt's husband is able to assist     OT SHORT TERM GOAL #2   Title Pt will demonstrate understanding of adapted strategies for ADLs/IADLs.   Time 4   Period Weeks   Status On-going  needs reinforcement     OT SHORT TERM GOAL #3   Title Pt will demonstrate ability to write a short paragraph with 100% legibility and only minimal decrease in letter size.   Time 4   Period Weeks   Status Achieved  11/16/16     OT SHORT TERM GOAL #4   Title Pt will demonstrate improved bilateral UE functional use as evidenced by increasing box/ blocks score by 3 blocks.   Time 4   Period Weeks   Status Achieved  RUE 49, LUE 50     OT SHORT TERM GOAL #5   Title Pt will increase right shoulder flexion  to 125* with -15 elbow ext to retrieve a lightweight object.   Baseline sh flexion: 120, elbow ext -20   Time 4   Period Weeks   Status Achieved  125,-15           OT Long Term Goals - 12/01/16 1204      OT LONG TERM GOAL #4   Title Pt will verbalize understanding of memory, visual compensation strategies for ADLs prn.   Time 8   Period Weeks   Status Achieved               Plan - 12/01/16 1203    Clinical Impression Statement Pt is progressing towards goals slowly, she remains limited by cognitive deficits. Pt's husband is supportive and able to assist pt.   Rehab Potential Good   OT Duration 8 weeks   OT Treatment/Interventions Self-care/ADL training;DME and/or AE instruction;Splinting;Patient/family education;Balance training;Therapeutic exercises;Ultrasound;Therapeutic exercise;Therapeutic activities;Cognitive remediation/compensation;Passive range of motion;Neuromuscular  education;Cryotherapy;Electrical Stimulation;Parrafin;Energy conservation;Manual Therapy;Visual/perceptual remediation/compensation   Plan issue memory compensations, fine motor coordination activities   OT Home Exercise Plan bag exercises issued   Consulted and Agree with Plan of Care Patient      Patient will benefit from skilled therapeutic intervention in order to improve the following deficits and impairments:  Abnormal gait, Decreased cognition, Impaired flexibility, Pain, Decreased mobility, Decreased coordination, Decreased activity tolerance, Decreased endurance, Decreased range of motion, Decreased strength, Impaired UE functional use, Difficulty walking, Decreased safety awareness, Decreased knowledge of precautions, Decreased balance  Visit Diagnosis: Muscle weakness (generalized)  Frontal lobe and executive function deficit  Abnormal posture  Other symptoms and signs involving the nervous system    Problem List Patient Active Problem List   Diagnosis Date Noted  . CAP (community acquired pneumonia) 04/13/2016  . PNA (pneumonia) 04/13/2016  . Murmur 09/02/2015  . Parkinson disease ([place]) 09/02/2015  . Bilateral carotid bruits 09/02/2015  . Confusion 08/07/2015  . Acute encephalopathy 08/07/2015  . UTI (lower urinary tract infection) 08/07/2015  . Acute kidney failure ([place]) 08/07/2015  . Paralysis agitans ([place]) 09/19/2012  . Lymphoma ([place]) 07/03/2011  . Malignant neoplasm of female breast ([place]) 07/03/2011  . Abnormal CT of the chest 03/13/2011  . HYPERTENSION, BENIGN 01/01/2009  . EDEMA 01/01/2009    Anhar Mcdermott 12/01/2016, 4:07 PM  [facility] [address] [place], [name], [address] Phone: [phone]   Fax:  [phone]  Name: [name] MRN: [id number] Date of Birth: [birth year]

## 2016-12-01 NOTE — Therapy (Signed)
[facility] [address] [place], [name], [address] Phone: [phone]   Fax:  [phone]  Speech Language Pathology Treatment  Patient Details  Name: [name] MRN: [id number] Date of Birth: 12/21/1927 Referring Provider: Alonza Bogus, D.O.  Encounter Date: 12/01/2016      End of Session - 12/01/16 1217    Visit Number 5   Number of Visits 17   Date for SLP Re-Evaluation 12/25/16   SLP Start Time 61   SLP Stop Time  1148   SLP Time Calculation (min) 46 min   Activity Tolerance Patient tolerated treatment well      Past Medical History:  Diagnosis Date  . Anxiety   . Asthma   . Depression   . Diabetes mellitus without complication ([place])    3/82/50.Marland KitchenMarland Kitchenpt denies  . GERD (gastroesophageal reflux disease)   . Hearing loss   . NHL (non-Hodgkin's lymphoma) ([place])    nhl dx 9/04 breast ca dx1/12  . Parkinson disease [facility])     Past Surgical History:  Procedure Laterality Date  . Ba-HA Ear implant    . MASTECTOMY  2 /8/ 12   bilateral  . MASTECTOMY      There were no vitals filed for this visit.      Subjective Assessment - 12/01/16 1111    Subjective Spouse accompaniedpt to ST               ADULT SLP TREATMENT - 12/01/16 1113      General Information   Behavior/Cognition Alert;Cooperative;Pleasant mood;Confused;Hard of hearing     Treatment Provided   Treatment provided Cognitive-Linquistic     Pain Assessment   Pain Assessment 0-10   Pain Score 4    Pain Location back   Pain Descriptors / Indicators Aching;Constant   Pain Intervention(s) Monitored during session     Cognitive-Linquistic Treatment   Treatment focused on Dysarthria;Patient/family/caregiver education   Skilled Treatment Loud /a/average 88dB with usual min visual and verbal cues for breath support and volume. Structured speech tasks naming pictures of objects and explaining which does not belong out of 4 with  average of 67dB, spouse verbalize surprise re: her difficulty processing and doing this task.  Instructed pt and spouse that cognitive changes, word finding and slow processing can be c/w PD. Trained spouse in environmental compensations to support pt's participation in conversation and cognitive activities to do at home. See pt instructions     Assessment / Recommendations / Plan   Plan Continue with current plan of care     Progression Toward Goals   Progression toward goals Progressing toward goals          SLP Education - 12/01/16 1210    Education provided Yes   Education Details compensations for reduced cognition and word finding; cognitive activities to do at home; environmental support for recall   Person(s) Educated Patient;Spouse   Methods Explanation;Demonstration;Verbal cues;Handout   Comprehension Verbalized understanding;Verbal cues required;Need further instruction          SLP Short Term Goals - 12/01/16 1216      SLP SHORT TERM GOAL #1   Title Pt will demonstrate sustained /a/ of 85dB with rare min A over 3 sessions   Time 2   Period Weeks   Status On-going     SLP SHORT TERM GOAL #2   Title Pt will maintain average of 70dB during structured speech tasks with occassional min A over 3 sessions    Time  2   Period Weeks   Status On-going     SLP SHORT TERM GOAL #3   Status Achieved          SLP Long Term Goals - 12/01/16 1217      SLP LONG TERM GOAL #1   Title Maintain an average of 70dB during conversation over 10 minutes with rare min A over two sessions    Time 6   Period Weeks   Status On-going     SLP LONG TERM GOAL #2   Title Converse audilbly in a noisy environment for 8 minutes with rare min A    Time 6   Period Weeks   Status On-going     SLP LONG TERM GOAL #3   Title pt will report husband asking less for her to repeat, than before ST   Time 6   Period Weeks   Status On-going          Plan - 12/01/16 1211    Clinical  Impression Statement Pt continues to demonstrate reduced volume and intelligibility in conversation and structured tasks with simple cognitive load. Spouse expressed surprise at her cognitive difficulties today. Initiated training spouse on compensations for cognitive and communication support. Continue skilled ST to maximize volume in conversation for improved intelligibility.   Speech Therapy Frequency 2x / week   Treatment/Interventions Compensatory strategies;Patient/family education;Functional tasks;Cueing hierarchy;SLP instruction and feedback;Internal/external aids;Cognitive reorganization   Potential to Achieve Goals Fair   Potential Considerations Previous level of function;Severity of impairments   Consulted and Agree with Plan of Care Patient;Family member/caregiver   Family Member Consulted husband [name]      Patient will benefit from skilled therapeutic intervention in order to improve the following deficits and impairments:   Dysarthria and anarthria  Cognitive communication deficit    Problem List Patient Active Problem List   Diagnosis Date Noted  . CAP (community acquired pneumonia) 04/13/2016  . PNA (pneumonia) 04/13/2016  . Murmur 09/02/2015  . Parkinson disease ([place]) 09/02/2015  . Bilateral carotid bruits 09/02/2015  . Confusion 08/07/2015  . Acute encephalopathy 08/07/2015  . UTI (lower urinary tract infection) 08/07/2015  . Acute kidney failure ([place]) 08/07/2015  . Paralysis agitans ([place]) 09/19/2012  . Lymphoma ([place]) 07/03/2011  . Malignant neoplasm of female breast ([place]) 07/03/2011  . Abnormal CT of the chest 03/13/2011  . HYPERTENSION, BENIGN 01/01/2009  . EDEMA 01/01/2009    [name], [name], [name] 12/01/2016, 12:18 PM  [facility] [address] [place], [name], [address] Phone: [phone]   Fax:  [phone]   Name: [name] MRN: [id number] Date of Birth: 11-08-1927

## 2016-12-03 ENCOUNTER — Ambulatory Visit: Payer: Medicare Other

## 2016-12-03 ENCOUNTER — Ambulatory Visit: Payer: Medicare Other | Admitting: Occupational Therapy

## 2016-12-03 DIAGNOSIS — R293 Abnormal posture: Secondary | ICD-10-CM | POA: Diagnosis not present

## 2016-12-03 DIAGNOSIS — R2689 Other abnormalities of gait and mobility: Secondary | ICD-10-CM

## 2016-12-03 DIAGNOSIS — R41841 Cognitive communication deficit: Secondary | ICD-10-CM | POA: Diagnosis not present

## 2016-12-03 DIAGNOSIS — R41844 Frontal lobe and executive function deficit: Secondary | ICD-10-CM

## 2016-12-03 DIAGNOSIS — R2681 Unsteadiness on feet: Secondary | ICD-10-CM

## 2016-12-03 DIAGNOSIS — R29818 Other symptoms and signs involving the nervous system: Secondary | ICD-10-CM

## 2016-12-03 DIAGNOSIS — M6281 Muscle weakness (generalized): Secondary | ICD-10-CM

## 2016-12-03 DIAGNOSIS — R471 Dysarthria and anarthria: Secondary | ICD-10-CM

## 2016-12-03 DIAGNOSIS — R278 Other lack of coordination: Secondary | ICD-10-CM | POA: Diagnosis not present

## 2016-12-03 NOTE — Patient Instructions (Addendum)
Memory/Cognitive Compensation Strategies  1. Use "WARM" strategy.  W= write it down  A= associate it  R= repeat it  M= make a mental note  2.   You can keep a Social worker.  Use a 3-ring notebook with sections for the following: calendar, important names and phone numbers,  medications, doctors' names/phone numbers, lists/reminders, and a section to journal what you did  each day.   3.    Use a calendar to write appointments down.  4.    Write yourself a schedule for the day.  This can be placed on the calendar or in a separate section of the Memory Notebook.  Keeping a  regular schedule can help memory.  5.    Use medication organizer with sections for each day or morning/evening pills.  You may need help loading it  6.    Keep a basket, or pegboard by the door.  Place items that you need to take out with you in the basket or on the pegboard.  You may also want to  include a message board for reminders.  7.    Use sticky notes.  Place sticky notes with reminders in a place where the task is performed.  For example: " turn off the  stove" placed by the stove, "lock the door" placed on the door at eye level, " take your medications" on  the bathroom mirror or by the place where you normally take your medications.  8.    Use alarms/timers.  Use while cooking to remind yourself to check on food or as a reminder to take your medicine, or as a  reminder to make a call, or as a reminder to perform another task, etc.  9.  Reduce clutter.  10.  Wear a digital watch.  11.  Try taking Parkinson's medication 30 min before a meal.  If you are hungry, try eating toast or a piece of fruit (non-protein food) with medication.  12.  Lay clothes out the night before and have a plan for breakfast to help you get ready for church faster.

## 2016-12-03 NOTE — Therapy (Addendum)
[facility] [address] [place], [name], [address] Phone: [phone]   Fax:  [phone]  Occupational Therapy Treatment  Patient Details  Name: [name] MRN: [id number] Date of Birth: March 23, 1928 Referring Provider: Dr. [name]  Encounter Date: 12/03/2016      OT End of Session - 12/03/16 1528    Visit Number 9   Number of Visits 17   Date for OT Re-Evaluation 12/20/16   Authorization Type MCR, UHC secondary   Authorization - Visit Number 9   Authorization - Number of Visits 10   OT Start Time 1450   OT Stop Time 1530   OT Time Calculation (min) 40 min   Activity Tolerance Patient tolerated treatment well   Behavior During Therapy [facility] for tasks assessed/performed      Past Medical History:  Diagnosis Date  . Anxiety   . Asthma   . Depression   . Diabetes mellitus without complication ([place])    01/21/30.Marland KitchenMarland Kitchenpt denies  . GERD (gastroesophageal reflux disease)   . Hearing loss   . NHL (non-Hodgkin's lymphoma) ([place])    nhl dx 9/04 breast ca dx1/12  . Parkinson disease [facility])     Past Surgical History:  Procedure Laterality Date  . Ba-HA Ear implant    . MASTECTOMY  2 /8/ 12   bilateral  . MASTECTOMY      There were no vitals filed for this visit.      Subjective Assessment - 12/03/16 1452    Subjective  Pt reports back  this a.m but none pain right now (tylenol this am)   Pertinent History Lumbar radiofrequency ablation (to treat lumbar facet joint pain)-last one 08/27/16 with relief of back pain; dementia due to Parkinson's; DVT (07/2016)   Patient Stated Goals improve independence with daily activities   Currently in Pain? No/denies       Flipping cards with each hand with mod cues for technique and large amplitude movements due to cognitive deficits.                       OT Education - 12/03/16 1527    Education Details Memory/cognitive compensation strategies--see pt  instructions   Person(s) Educated Patient; Husband   Methods Explanation;Handout   Comprehension Verbalized understanding          OT Short Term Goals - 11/27/16 1301      OT SHORT TERM GOAL #1   Title I with PD specific HEP .- 11/20/16   Time 4   Period Weeks   Status Achieved  needs min v.c for bag exercises, pt's husband is able to assist     OT [place] #2   Title Pt will demonstrate understanding of adapted strategies for ADLs/IADLs.   Time 4   Period Weeks   Status On-going  needs reinforcement     OT SHORT TERM GOAL #3   Title Pt will demonstrate ability to write a short paragraph with 100% legibility and only minimal decrease in letter size.   Time 4   Period Weeks   Status Achieved  11/16/16     OT SHORT TERM GOAL #4   Title Pt will demonstrate improved bilateral UE functional use as evidenced by increasing box/ blocks score by 3 blocks.   Time 4   Period Weeks   Status Achieved  RUE 49, LUE 50     OT SHORT TERM GOAL #5   Title Pt will increase right  shoulder flexion to 125* with -15 elbow ext to retrieve a lightweight object.   Baseline sh flexion: 120, elbow ext -20   Time 4   Period Weeks   Status Achieved  125,-15           OT Long Term Goals - 12/01/16 1204      OT LONG TERM GOAL #4   Title Pt will verbalize understanding of memory, visual compensation strategies for ADLs prn.   Time 8   Period Weeks   Status Achieved               Plan - 12/03/16 1529    Clinical Impression Statement Pt is progressing slowly towards goals.  Cognition remains a limiting factor.     Rehab Potential Good   OT Duration 8 weeks   OT Treatment/Interventions Self-care/ADL training;DME and/or AE instruction;Splinting;Patient/family education;Balance training;Therapeutic exercises;Ultrasound;Therapeutic exercise;Therapeutic activities;Cognitive remediation/compensation;Passive range of motion;Neuromuscular education;Cryotherapy;Electrical  Stimulation;Parrafin;Energy conservation;Manual Therapy;Visual/perceptual remediation/compensation   Plan G-code next session; coordination   OT Home Exercise Plan bag exercises issued   Consulted and Agree with Plan of Care Patient      Patient will benefit from skilled therapeutic intervention in order to improve the following deficits and impairments:  Abnormal gait, Decreased cognition, Impaired flexibility, Pain, Decreased mobility, Decreased coordination, Decreased activity tolerance, Decreased endurance, Decreased range of motion, Decreased strength, Impaired UE functional use, Difficulty walking, Decreased safety awareness, Decreased knowledge of precautions, Decreased balance  Visit Diagnosis: Frontal lobe and executive function deficit  Abnormal posture  Other symptoms and signs involving the nervous system  Other lack of coordination  Other abnormalities of gait and mobility  Unsteadiness on feet  Muscle weakness (generalized)    Problem List Patient Active Problem List   Diagnosis Date Noted  . CAP (community acquired pneumonia) 04/13/2016  . PNA (pneumonia) 04/13/2016  . Murmur 09/02/2015  . Parkinson disease ([place]) 09/02/2015  . Bilateral carotid bruits 09/02/2015  . Confusion 08/07/2015  . Acute encephalopathy 08/07/2015  . UTI (lower urinary tract infection) 08/07/2015  . Acute kidney failure ([place]) 08/07/2015  . Paralysis agitans ([place]) 09/19/2012  . Lymphoma ([place]) 07/03/2011  . Malignant neoplasm of female breast ([place]) 07/03/2011  . Abnormal CT of the chest 03/13/2011  . HYPERTENSION, BENIGN 01/01/2009  . EDEMA 01/01/2009    [facility] 12/03/2016, 3:35 PM  [place] [address] [place], [name], [address] Phone: [phone]   Fax:  [phone]  Name: [name] MRN: [id number] Date of Birth: April 01, 1928   Vianne Bulls, OTR/L [facility] [address] [place], [place]  [address] [phone] phone [phone] 12/03/16 3:35 PM

## 2016-12-03 NOTE — Patient Instructions (Signed)
  Please complete the assigned speech therapy homework prior to your next session.  

## 2016-12-03 NOTE — Therapy (Signed)
[facility] [address] [place], [name], [address] Phone: [phone]   Fax:  [phone]  Speech Language Pathology Treatment  Patient Details  Name: [name] MRN: [id number] Date of Birth: [birth year] Referring Provider: Alonza Bogus, D.O.  Encounter Date: 12/03/2016      End of Session - 12/03/16 1640    Visit Number 6   Number of Visits 17   Date for SLP Re-Evaluation 12/25/16   SLP Start Time 1533   SLP Stop Time  1615   SLP Time Calculation (min) 42 min   Activity Tolerance Patient tolerated treatment well      Past Medical History:  Diagnosis Date  . Anxiety   . Asthma   . Depression   . Diabetes mellitus without complication ([place])    8/54/62.Marland KitchenMarland Kitchenpt denies  . GERD (gastroesophageal reflux disease)   . Hearing loss   . NHL (non-Hodgkin's lymphoma) ([place])    nhl dx 9/04 breast ca dx1/12  . Parkinson disease [facility])     Past Surgical History:  Procedure Laterality Date  . Ba-HA Ear implant    . MASTECTOMY  2 /8/ 12   bilateral  . MASTECTOMY      There were no vitals filed for this visit.      Subjective Assessment - 12/03/16 1538    Subjective Pt is alone today.   Currently in Pain? No/denies               ADULT SLP TREATMENT - 12/03/16 1538      General Information   Behavior/Cognition Alert;Cooperative;Pleasant mood     Treatment Provided   Treatment provided Cognitive-Linquistic     Cognitive-Linquistic Treatment   Treatment focused on Dysarthria   Skilled Treatment Pt loud /a/ average upper 80s dB. Pt's loud /a/ was consistent with phlegm/pharyngeal secretions, pt cleared throat harshly multiple times and SLP reminded her to use hard swallows wiht H2O instead of harsh/hard throat clears. Pt req'd occasional cues with phrase responses for loudness. In multisentence responses pt req'd usual min-mod A for increasing her loudness to WNL. Pt abruptly switched  conversational topics with after a short pause in conversation.     Assessment / Recommendations / Plan   Plan Continue with current plan of care     Progression Toward Goals   Progression toward goals Progressing toward goals          SLP Education - 12/03/16 1640    Education provided Yes   Education Details Pt to [place] with all ST homework, and write answers if desired   Person(s) Educated Patient   Methods Explanation   Comprehension Verbalized understanding;Need further instruction          SLP Short Term Goals - 12/03/16 1642      SLP SHORT TERM GOAL #1   Title Pt will demonstrate sustained /a/ of 85dB with rare min A over 3 sessions   Time 2   Period Weeks   Status On-going     SLP SHORT TERM GOAL #2   Title Pt will maintain average of 70dB during structured speech tasks with occassional min A over 3 sessions    Time 2   Period Weeks   Status On-going     SLP SHORT TERM GOAL #3   Status Achieved          SLP Long Term Goals - 12/03/16 1642      SLP LONG TERM GOAL #1   Title Maintain an average  of 70dB during conversation over 10 minutes with rare min A over two sessions    Time 6   Period Weeks   Status On-going     SLP LONG TERM GOAL #2   Title [place] audilbly in a noisy environment for 8 minutes with rare min A    Time 6   Period Weeks   Status On-going     SLP LONG TERM GOAL #3   Title pt will report husband asking less for her to repeat, than before ST   Time 6   Period Weeks   Status On-going          Plan - 12/03/16 1641    Clinical Impression Statement Pt continues to demonstrate reduced volume and intelligibility in conversation and structured tasks with simple cognitive load. In sentnece responses she req'd usual A to incr her volume to WNL. Continue skilled ST to maximize volume in conversation for improved intelligibility.   Speech Therapy Frequency 2x / week   Treatment/Interventions Compensatory strategies;Patient/family  education;Functional tasks;Cueing hierarchy;SLP instruction and feedback;Internal/external aids;Cognitive reorganization   Potential to Achieve Goals Fair   Potential Considerations Previous level of function;Severity of impairments   Consulted and Agree with Plan of Care Patient;Family member/caregiver   Family Member Consulted husband [name]      Patient will benefit from skilled therapeutic intervention in order to improve the following deficits and impairments:   Dysarthria and anarthria  Cognitive communication deficit    Problem List Patient Active Problem List   Diagnosis Date Noted  . CAP (community acquired pneumonia) 04/13/2016  . PNA (pneumonia) 04/13/2016  . Murmur 09/02/2015  . Parkinson disease ([place]) 09/02/2015  . Bilateral carotid bruits 09/02/2015  . Confusion 08/07/2015  . Acute encephalopathy 08/07/2015  . UTI (lower urinary tract infection) 08/07/2015  . Acute kidney failure ([place]) 08/07/2015  . Paralysis agitans ([place]) 09/19/2012  . Lymphoma ([place]) 07/03/2011  . Malignant neoplasm of female breast ([place]) 07/03/2011  . Abnormal CT of the chest 03/13/2011  . HYPERTENSION, BENIGN 01/01/2009  . EDEMA 01/01/2009    [facility] ,[place], CCC-SLP  12/03/2016, 4:43 PM  [facility] [address] [place], [name], [address] Phone: [phone]   Fax:  [phone]   Name: [name] MRN: [id number] Date of Birth: 1927-09-10

## 2016-12-07 ENCOUNTER — Ambulatory Visit: Payer: Medicare Other | Admitting: Occupational Therapy

## 2016-12-07 ENCOUNTER — Ambulatory Visit: Payer: Medicare Other

## 2016-12-07 DIAGNOSIS — R41844 Frontal lobe and executive function deficit: Secondary | ICD-10-CM | POA: Diagnosis not present

## 2016-12-07 DIAGNOSIS — R471 Dysarthria and anarthria: Secondary | ICD-10-CM

## 2016-12-07 DIAGNOSIS — R2681 Unsteadiness on feet: Secondary | ICD-10-CM

## 2016-12-07 DIAGNOSIS — R293 Abnormal posture: Secondary | ICD-10-CM

## 2016-12-07 DIAGNOSIS — R41841 Cognitive communication deficit: Secondary | ICD-10-CM | POA: Diagnosis not present

## 2016-12-07 DIAGNOSIS — R2689 Other abnormalities of gait and mobility: Secondary | ICD-10-CM

## 2016-12-07 DIAGNOSIS — R278 Other lack of coordination: Secondary | ICD-10-CM

## 2016-12-07 DIAGNOSIS — R29818 Other symptoms and signs involving the nervous system: Secondary | ICD-10-CM

## 2016-12-07 DIAGNOSIS — M6281 Muscle weakness (generalized): Secondary | ICD-10-CM

## 2016-12-07 NOTE — Therapy (Signed)
[facility] [address] [place], [name], [address] Phone: [phone]   Fax:  [phone]  Occupational Therapy Treatment  Patient Details  Name: [name] MRN: [id number] Date of Birth: 13-Mar-1928 Referring Provider: Dr. [name]  Encounter Date: 12/07/2016      OT End of Session - 12/07/16 1153    Visit Number 10   Number of Visits 17   Date for OT Re-Evaluation 12/20/16   Authorization Type MCR, UHC secondary   Authorization - Visit Number 10   Authorization - Number of Visits 10   OT Start Time 1148   OT Stop Time 1230   OT Time Calculation (min) 42 min   Activity Tolerance Patient tolerated treatment well   Behavior During Therapy [facility] for tasks assessed/performed      Past Medical History:  Diagnosis Date  . Anxiety   . Asthma   . Depression   . Diabetes mellitus without complication ([place])    11/18/93.Marland KitchenMarland Kitchenpt denies  . GERD (gastroesophageal reflux disease)   . Hearing loss   . NHL (non-Hodgkin's lymphoma) ([place])    nhl dx 9/04 breast ca dx1/12  . Parkinson disease [facility])     Past Surgical History:  Procedure Laterality Date  . Ba-HA Ear implant    . MASTECTOMY  2 /8/ 12   bilateral  . MASTECTOMY      There were no vitals filed for this visit.      Subjective Assessment - 12/07/16 1150    Subjective  Pt reports that the medication has better since changes discussed last session (taking prior to breakfast) and back pain was better this am   Patient is accompained by: Family member  (reviewed info with husband at end of session)   Pertinent History Lumbar radiofrequency ablation (to treat lumbar facet joint pain)-last one 08/27/16 with relief of back pain; dementia due to Parkinson's; DVT (07/2016)   Patient Stated Goals improve independence with daily activities   Currently in Pain? No/denies       Assessed 9-hole peg test, standing functional reach, and fastening/unfastening 3  buttons--see below for scores.   Coordination activities with each hand (stacking coins, rotating ball in fingertips, flipping cards, dealing cards with thumb, manipulating coins in hand to place in coin bank) with focus on large amplitude movements with mod cues for incr movement amplitude and for following directions due to cognitive deficits.   Practiced fastening/unfastening buttons with min cues initially.   In standing functional reaching incorporating wt. Shift and trunk rotation, reaching outside base of support for incr activity tolerance, coordination, and balance with min cueing/set-up for large amplitude.   Arm bike x4mn level 1 for reciprocal movement with cues/target of at least 30-40rpms for intensity while maintaining movement amplitude/reciprocal movement.   Pt maintained 30-40rpms.                         OT Short Term Goals - 11/27/16 1301      OT SHORT TERM GOAL #1   Title I with PD specific HEP .- 11/20/16   Time 4   Period Weeks   Status Achieved  needs min v.c for bag exercises, pt's husband is able to assist     OT SHORT TERM GOAL #2   Title Pt will demonstrate understanding of adapted strategies for ADLs/IADLs.   Time 4   Period Weeks   Status On-going  needs reinforcement     OT SHORT TERM  GOAL #3   Title Pt will demonstrate ability to write a short paragraph with 100% legibility and only minimal decrease in letter size.   Time 4   Period Weeks   Status Achieved  11/16/16     OT SHORT TERM GOAL #4   Title Pt will demonstrate improved bilateral UE functional use as evidenced by increasing box/ blocks score by 3 blocks.   Time 4   Period Weeks   Status Achieved  RUE 49, LUE 50     OT SHORT TERM GOAL #5   Title Pt will increase right shoulder flexion to 125* with -15 elbow ext to retrieve a lightweight object.   Baseline sh flexion: 120, elbow ext -20   Time 4   Period Weeks   Status Achieved  125,-15           OT  Long Term Goals - 12/07/16 1158      OT LONG TERM GOAL #1   Title Pt will demonstrate improved safety for ADLs as evidenced by increasing bilateral standing functional reach to 10 inches or greater.- due 10/19/16   Time 8   Period Weeks   Status On-going  12/07/16:  8" bilaterally     OT LONG TERM GOAL #2   Title Pt will improve R dominant hand coordination for ADLs as shown by improving 9-hole peg test score by at least 3sec.    Baseline R 31.41 secs, LUE 30.32 secs   Time 8   Period Weeks   Status On-going  12/07/16:  28.97sec     OT LONG TERM GOAL #3   Title Pt will demonstrate improved ease with fastening buttons as evidenced by decreasing 3 button/ unbutton to 32 secs or less.   Baseline 36.72 secs   Time 8   Period Weeks   Status On-going  12/07/16:  36.75     OT LONG TERM GOAL #4   Title Pt will verbalize understanding of memory, visual compensation strategies for ADLs prn.   Time 8   Period Weeks   Status Achieved  12/03/16  met     OT LONG TERM GOAL #5   Title --------------------------------------------------------------------------------------------------------               Plan - 12/07/16 1204    Clinical Impression Statement Progress has been slowed due to cognitive deficits.  Pt has made progress with functional reaching and ROM.  Pt would benefit from continued occupational therapy to reinforce strategies and coordination for improved ADL performance and to prevent future complications.   Rehab Potential Good   Clinical Impairments Affecting Rehab Potential cognitive deficits   OT Frequency 2x / week   OT Duration 8 weeks   OT Treatment/Interventions Self-care/ADL training;DME and/or AE instruction;Splinting;Patient/family education;Balance training;Therapeutic exercises;Ultrasound;Therapeutic exercise;Therapeutic activities;Cognitive remediation/compensation;Passive range of motion;Neuromuscular education;Cryotherapy;Electrical Stimulation;Parrafin;Energy  conservation;Manual Therapy;Visual/perceptual remediation/compensation   Plan continue with coordination, functional reaching in standing   OT Home Exercise Plan bag exercises issued   Consulted and Agree with Plan of Care Patient      Patient will benefit from skilled therapeutic intervention in order to improve the following deficits and impairments:  Abnormal gait, Decreased cognition, Impaired flexibility, Pain, Decreased mobility, Decreased coordination, Decreased activity tolerance, Decreased endurance, Decreased range of motion, Decreased strength, Impaired UE functional use, Difficulty walking, Decreased safety awareness, Decreased knowledge of precautions, Decreased balance  Visit Diagnosis: Other symptoms and signs involving the nervous system  Abnormal posture  Frontal lobe and executive function deficit  Other lack of  coordination  Other abnormalities of gait and mobility  Unsteadiness on feet  Muscle weakness (generalized)      G-Codes - 12-26-16 1159    Functional Assessment Tool Used (Outpatient only) 3 button/ unbutton: 36.75 secs, bilateral standing functional reach: 8 inches, 9 hole peg test RUE: 28.97sec   Functional Limitation Self care   Self Care Current Status (T0177) At least 20 percent but less than 40 percent impaired, limited or restricted   Self Care Goal Status (L3903) At least 1 percent but less than 20 percent impaired, limited or restricted       Occupational Therapy Progress Note  Dates of Reporting Period: 10/21/16 to 2016-12-26  Objective Reports of Subjective Statement: see above  Objective Measurements: see above  Goal Update: see above  Plan: see above  Reason Skilled Services are Required: see above     Problem List Patient Active Problem List   Diagnosis Date Noted  . CAP (community acquired pneumonia) 04/13/2016  . PNA (pneumonia) 04/13/2016  . Murmur 09/02/2015  . Parkinson disease ([place]) 09/02/2015  . Bilateral carotid  bruits 09/02/2015  . Confusion 08/07/2015  . Acute encephalopathy 08/07/2015  . UTI (lower urinary tract infection) 08/07/2015  . Acute kidney failure ([place]) 08/07/2015  . Paralysis agitans ([place]) 09/19/2012  . Lymphoma ([place]) 07/03/2011  . Malignant neoplasm of female breast ([place]) 07/03/2011  . Abnormal CT of the chest 03/13/2011  . HYPERTENSION, BENIGN 01/01/2009  . EDEMA 01/01/2009    [facility] December 26, 2016, 12:30 PM  [facility] [address] [place], [name], [address] Phone: [phone]   Fax:  [phone]  Name: Hevin Jeffcoat MRN: [id number] Date of Birth: November 09, 1927   Vianne Bulls, OTR/L [facility] [address] [place], [place]  [address] [phone] phone [phone] 12-26-16 12:30 PM

## 2016-12-07 NOTE — Therapy (Signed)
[facility] [address] [place], [name], [address] Phone: [phone]   Fax:  [phone]  Speech Language Pathology Treatment  Patient Details  Name: [name] MRN: [id number] Date of Birth: [birth year] Referring Provider: Alonza Bogus, D.O.  Encounter Date: 12/07/2016      End of Session - 12/07/16 1146    Visit Number 7   Number of Visits 17   Date for SLP Re-Evaluation 12/25/16   SLP Start Time 1103   SLP Stop Time  1146   SLP Time Calculation (min) 43 min   Activity Tolerance Patient tolerated treatment well      Past Medical History:  Diagnosis Date  . Anxiety   . Asthma   . Depression   . Diabetes mellitus without complication ([place])    6/76/72.Marland KitchenMarland Kitchenpt denies  . GERD (gastroesophageal reflux disease)   . Hearing loss   . NHL (non-Hodgkin's lymphoma) ([place])    nhl dx 9/04 breast ca dx1/12  . Parkinson disease [facility])     Past Surgical History:  Procedure Laterality Date  . Ba-HA Ear implant    . MASTECTOMY  2 /8/ 12   bilateral  . MASTECTOMY      There were no vitals filed for this visit.      Subjective Assessment - 12/07/16 1141    Subjective Pt is alone today.   Currently in Pain? No/denies               ADULT SLP TREATMENT - 12/07/16 1141      General Information   Behavior/Cognition Alert;Cooperative;Pleasant mood     Treatment Provided   Treatment provided Cognitive-Linquistic     Cognitive-Linquistic Treatment   Treatment focused on Dysarthria   Skilled Treatment Pt loud /a/ average upper 80s/low 90s dB. No evidence of phlegm/pharyngeal secretions today. Pt req'd min rare cues with phrase responses for loudness. In multisentence responses pt req'd occasional min-mod A for increasing her loudness to WNL.      Assessment / Recommendations / Plan   Plan Continue with current plan of care            SLP Short Term Goals - 12/07/16 1149      SLP SHORT TERM  GOAL #1   Title Pt will demonstrate sustained /a/ of 85dB with rare min A over 3 sessions   Status Achieved     SLP SHORT TERM GOAL #2   Title Pt will maintain average of 70dB during structured speech tasks with occassional min A over 3 sessions    Time 2   Period Weeks   Status On-going     SLP SHORT TERM GOAL #3   Status Achieved          SLP Long Term Goals - 12/07/16 1150      SLP LONG TERM GOAL #1   Title Maintain an average of 70dB during conversation over 10 minutes with rare min A over two sessions    Time 5   Period Weeks   Status On-going     SLP LONG TERM GOAL #2   Title [place] audilbly in a noisy environment for 8 minutes with rare min A    Time 5   Period Weeks   Status On-going     SLP LONG TERM GOAL #3   Title pt will report husband asking less for her to repeat, than before ST   Time 5   Period Weeks   Status On-going  Plan - 12/07/16 1147    Clinical Impression Statement Pt continues to demonstrate reduced volume and intelligibility in conversation and structured tasks with simple cognitive load. In sentnece responses she req'd occasional min-mod A to incr her volume to WNL. Continue skilled ST to maximize volume in conversation for improved intelligibility.   Speech Therapy Frequency 2x / week   Treatment/Interventions Compensatory strategies;Patient/family education;Functional tasks;Cueing hierarchy;SLP instruction and feedback;Internal/external aids;Cognitive reorganization   Potential to Achieve Goals Fair   Potential Considerations Previous level of function;Severity of impairments   Consulted and Agree with Plan of Care Patient;Family member/caregiver   Family Member Consulted husband [name]      Patient will benefit from skilled therapeutic intervention in order to improve the following deficits and impairments:   Dysarthria and anarthria  Cognitive communication deficit    Problem List Patient Active Problem List   Diagnosis  Date Noted  . CAP (community acquired pneumonia) 04/13/2016  . PNA (pneumonia) 04/13/2016  . Murmur 09/02/2015  . Parkinson disease ([place]) 09/02/2015  . Bilateral carotid bruits 09/02/2015  . Confusion 08/07/2015  . Acute encephalopathy 08/07/2015  . UTI (lower urinary tract infection) 08/07/2015  . Acute kidney failure ([place]) 08/07/2015  . Paralysis agitans ([place]) 09/19/2012  . Lymphoma ([place]) 07/03/2011  . Malignant neoplasm of female breast ([place]) 07/03/2011  . Abnormal CT of the chest 03/13/2011  . HYPERTENSION, BENIGN 01/01/2009  . EDEMA 01/01/2009    [facility] ,MS, CCC-SLP  12/07/2016, 11:50 AM  [facility] [address] [place], [name], [address] Phone: [phone]   Fax:  [phone]   Name: [name] MRN: [id number] Date of Birth: 08-14-1927

## 2016-12-09 ENCOUNTER — Ambulatory Visit: Payer: Medicare Other | Admitting: Occupational Therapy

## 2016-12-09 DIAGNOSIS — R278 Other lack of coordination: Secondary | ICD-10-CM

## 2016-12-09 DIAGNOSIS — R293 Abnormal posture: Secondary | ICD-10-CM

## 2016-12-09 DIAGNOSIS — R41844 Frontal lobe and executive function deficit: Secondary | ICD-10-CM | POA: Diagnosis not present

## 2016-12-09 DIAGNOSIS — R29818 Other symptoms and signs involving the nervous system: Secondary | ICD-10-CM | POA: Diagnosis not present

## 2016-12-09 DIAGNOSIS — R2681 Unsteadiness on feet: Secondary | ICD-10-CM

## 2016-12-09 DIAGNOSIS — R41841 Cognitive communication deficit: Secondary | ICD-10-CM | POA: Diagnosis not present

## 2016-12-09 DIAGNOSIS — R471 Dysarthria and anarthria: Secondary | ICD-10-CM | POA: Diagnosis not present

## 2016-12-09 DIAGNOSIS — M47816 Spondylosis without myelopathy or radiculopathy, lumbar region: Secondary | ICD-10-CM | POA: Diagnosis not present

## 2016-12-09 NOTE — Therapy (Signed)
[facility] [address] [place], [name], [address] Phone: [phone]   Fax:  [phone]  Occupational Therapy Treatment  Patient Details  Name: [name] MRN: [id number] Date of Birth: [birth year] Referring Provider: Dr. [name]  Encounter Date: 12/09/2016      OT End of Session - 12/09/16 1549    Visit Number 11   Number of Visits 17   Date for OT Re-Evaluation 12/20/16   Authorization Type MCR, UHC secondary   Authorization - Visit Number 11   Authorization - Number of Visits 20   OT Start Time 1450  2 units only, pt eating snack   OT Stop Time 1530   OT Time Calculation (min) 40 min   Activity Tolerance Patient tolerated treatment well   Behavior During Therapy [facility] for tasks assessed/performed      Past Medical History:  Diagnosis Date  . Anxiety   . Asthma   . Depression   . Diabetes mellitus without complication ([place])    1/50/56.Marland KitchenMarland Kitchenpt denies  . GERD (gastroesophageal reflux disease)   . Hearing loss   . NHL (non-Hodgkin's lymphoma) ([place])    nhl dx 9/04 breast ca dx1/12  . Parkinson disease [facility])     Past Surgical History:  Procedure Laterality Date  . Ba-HA Ear implant    . MASTECTOMY  2 /8/ 12   bilateral  . MASTECTOMY      There were no vitals filed for this visit.      Subjective Assessment - 12/09/16 1546    Pertinent History Lumbar radiofrequency ablation (to treat lumbar facet joint pain)-last one 08/27/16 with relief of back pain; dementia due to Parkinson's; DVT (07/2016)   Patient Stated Goals improve independence with daily activities   Currently in Pain? No/denies             Treatment: Pt reported feeling dizzy on arriveal. BP 160/ 85. Pt reports she has only eaten 2 cholate chip cookies. therapsit provided pt with some peanut butter crackers. Pt reports feeling better after eating . In standing functional reaching incorporating wt. Shift and trunk rotation to  place large pegs in vertical pegboard, reaching outside base of support for incr activity tolerance, coordination, and balance with min cueing/set-up for large amplitude.  Seated at table flipping and dealing playing cards with right then left hands , mod v.c. For large amplitude movements.                   OT Short Term Goals - 11/27/16 1301      OT SHORT TERM GOAL #1   Title I with PD specific HEP .- 11/20/16   Time 4   Period Weeks   Status Achieved  needs min v.c for bag exercises, pt's husband is able to assist     OT SHORT TERM GOAL #2   Title Pt will demonstrate understanding of adapted strategies for ADLs/IADLs.   Time 4   Period Weeks   Status On-going  needs reinforcement     OT SHORT TERM GOAL #3   Title Pt will demonstrate ability to write a short paragraph with 100% legibility and only minimal decrease in letter size.   Time 4   Period Weeks   Status Achieved  11/16/16     OT SHORT TERM GOAL #4   Title Pt will demonstrate improved bilateral UE functional use as evidenced by increasing box/ blocks score by 3 blocks.   Time 4   Period Weeks  Status Achieved  RUE 49, LUE 50     OT SHORT TERM GOAL #5   Title Pt will increase right shoulder flexion to 125* with -15 elbow ext to retrieve a lightweight object.   Baseline sh flexion: 120, elbow ext -20   Time 4   Period Weeks   Status Achieved  125,-15           OT Long Term Goals - 12/07/16 1158      OT LONG TERM GOAL #1   Title Pt will demonstrate improved safety for ADLs as evidenced by increasing bilateral standing functional reach to 10 inches or greater.- due 10/19/16   Time 8   Period Weeks   Status On-going  12/07/16:  8" bilaterally     OT LONG TERM GOAL #2   Title Pt will improve R dominant hand coordination for ADLs as shown by improving 9-hole peg test score by at least 3sec.    Baseline R 31.41 secs, LUE 30.32 secs   Time 8   Period Weeks   Status On-going  12/07/16:  28.97sec      OT LONG TERM GOAL #3   Title Pt will demonstrate improved ease with fastening buttons as evidenced by decreasing 3 button/ unbutton to 32 secs or less.   Baseline 36.72 secs   Time 8   Period Weeks   Status On-going  12/07/16:  36.75     OT LONG TERM GOAL #4   Title Pt will verbalize understanding of memory, visual compensation strategies for ADLs prn.   Time 8   Period Weeks   Status Achieved  12/03/16  met     OT LONG TERM GOAL #5   Title --------------------------------------------------------------------------------------------------------               Plan - 12/09/16 1547    Clinical Impression Statement Pt is progressing slowly towards goals. Pt did not feel well today when she arrived however pt reports she did not eat much. Pt was provided with peanut butter crackers and she felt better.   Rehab Potential Good   Clinical Impairments Affecting Rehab Potential cognitive deficits   OT Frequency 2x / week   OT Duration 8 weeks   OT Treatment/Interventions Self-care/ADL training;DME and/or AE instruction;Splinting;Patient/family education;Balance training;Therapeutic exercises;Ultrasound;Therapeutic exercise;Therapeutic activities;Cognitive remediation/compensation;Passive range of motion;Neuromuscular education;Cryotherapy;Electrical Stimulation;Parrafin;Energy conservation;Manual Therapy;Visual/perceptual remediation/compensation   Plan continue to address functional reaching in standing, work towards goals   Consulted and Agree with Plan of Care Patient      Patient will benefit from skilled therapeutic intervention in order to improve the following deficits and impairments:  Abnormal gait, Decreased cognition, Impaired flexibility, Pain, Decreased mobility, Decreased coordination, Decreased activity tolerance, Decreased endurance, Decreased range of motion, Decreased strength, Impaired UE functional use, Difficulty walking, Decreased safety awareness, Decreased  knowledge of precautions, Decreased balance  Visit Diagnosis: Other symptoms and signs involving the nervous system  Abnormal posture  Frontal lobe and executive function deficit  Other lack of coordination  Unsteadiness on feet    Problem List Patient Active Problem List   Diagnosis Date Noted  . CAP (community acquired pneumonia) 04/13/2016  . PNA (pneumonia) 04/13/2016  . Murmur 09/02/2015  . Parkinson disease ([place]) 09/02/2015  . Bilateral carotid bruits 09/02/2015  . Confusion 08/07/2015  . Acute encephalopathy 08/07/2015  . UTI (lower urinary tract infection) 08/07/2015  . Acute kidney failure ([place]) 08/07/2015  . Paralysis agitans ([place]) 09/19/2012  . Lymphoma ([place]) 07/03/2011  . Malignant neoplasm of female breast ([place])  07/03/2011  . Abnormal CT of the chest 03/13/2011  . HYPERTENSION, BENIGN 01/01/2009  . EDEMA 01/01/2009    RINE,KATHRYN 12/09/2016, 3:50 PM  [facility] [address] [place], [name], [address] Phone: [phone]   Fax:  [phone]  Name: [name] MRN: [id number] Date of Birth: 1928-05-15

## 2016-12-10 ENCOUNTER — Encounter: Payer: Medicare Other | Admitting: Occupational Therapy

## 2016-12-10 ENCOUNTER — Ambulatory Visit: Payer: Medicare Other | Admitting: Speech Pathology

## 2016-12-10 DIAGNOSIS — R293 Abnormal posture: Secondary | ICD-10-CM | POA: Diagnosis not present

## 2016-12-10 DIAGNOSIS — R41844 Frontal lobe and executive function deficit: Secondary | ICD-10-CM | POA: Diagnosis not present

## 2016-12-10 DIAGNOSIS — R278 Other lack of coordination: Secondary | ICD-10-CM | POA: Diagnosis not present

## 2016-12-10 DIAGNOSIS — R41841 Cognitive communication deficit: Secondary | ICD-10-CM | POA: Diagnosis not present

## 2016-12-10 DIAGNOSIS — R29818 Other symptoms and signs involving the nervous system: Secondary | ICD-10-CM | POA: Diagnosis not present

## 2016-12-10 DIAGNOSIS — R471 Dysarthria and anarthria: Secondary | ICD-10-CM | POA: Diagnosis not present

## 2016-12-10 NOTE — Therapy (Signed)
[facility] [address] [place], [name], [address] Phone: [phone]   Fax:  [phone]  Speech Language Pathology Treatment  Patient Details  Name: [name] MRN: [id number] Date of Birth: [birth year] Referring Provider: Alonza Bogus, D.O.  Encounter Date: 12/10/2016      End of Session - 12/10/16 1310    Visit Number 8   Number of Visits 17   Date for SLP Re-Evaluation 12/25/16   SLP Start Time 1232   SLP Stop Time  5456   SLP Time Calculation (min) 41 min   Activity Tolerance Patient tolerated treatment well      Past Medical History:  Diagnosis Date  . Anxiety   . Asthma   . Depression   . Diabetes mellitus without complication ([place])    2/56/38.Marland KitchenMarland Kitchenpt denies  . GERD (gastroesophageal reflux disease)   . Hearing loss   . NHL (non-Hodgkin's lymphoma) ([place])    nhl dx 9/04 breast ca dx1/12  . Parkinson disease [facility])     Past Surgical History:  Procedure Laterality Date  . Ba-HA Ear implant    . MASTECTOMY  2 /8/ 12   bilateral  . MASTECTOMY      There were no vitals filed for this visit.      Subjective Assessment - 12/10/16 1237    Subjective "We have 2 vacation times coming up and I don't know if I can manage it"               ADULT SLP TREATMENT - 12/10/16 1237      General Information   Behavior/Cognition Alert;Cooperative;Pleasant mood     Treatment Provided   Treatment provided Cognitive-Linquistic     Cognitive-Linquistic Treatment   Treatment focused on Dysarthria   Skilled Treatment Loud /a / averaged 88dB with occasional min verbal cues for loudness. Simple conversation with problem solving pictures for conversation stimulation with occasional min to mod verbal cues for volume average 68 dB. Structured speech tasks with average of 70dB and occasional min visual and verbal cues for volume and breath support.      Assessment / Recommendations / Plan   Plan  Continue with current plan of care     Progression Toward Goals   Progression toward goals Progressing toward goals            SLP Short Term Goals - 12/10/16 1309      SLP SHORT TERM GOAL #1   Title Pt will demonstrate sustained /a/ of 85dB with rare min A over 3 sessions   Status Achieved     SLP SHORT TERM GOAL #2   Title Pt will maintain average of 70dB during structured speech tasks with occassional min A over 3 sessions    Time 2   Period Weeks   Status On-going     SLP SHORT TERM GOAL #3   Status Achieved          SLP Long Term Goals - 12/10/16 1309      SLP LONG TERM GOAL #1   Title Maintain an average of 70dB during conversation over 10 minutes with rare min A over two sessions    Time 5   Period Weeks   Status On-going     SLP LONG TERM GOAL #2   Title Converse audilbly in a noisy environment for 8 minutes with rare min A    Time 5   Period Weeks   Status On-going     SLP  LONG TERM GOAL #3   Title pt will report husband asking less for her to repeat, than before ST   Time 5   Period Weeks   Status On-going          Plan - 12/10/16 1307    Clinical Impression Statement Pt continues to demonstrate reduced volume and intelligibility in conversation and structured tasks with simple cognitive load. In sentnece responses she req'd occasional min-mod A to incr her volume to WNL. Continue skilled ST to maximize volume in conversation for improved intelligibility.   Speech Therapy Frequency 2x / week   Treatment/Interventions Compensatory strategies;Patient/family education;Functional tasks;Cueing hierarchy;SLP instruction and feedback;Internal/external aids;Cognitive reorganization   Potential to Achieve Goals Fair   Potential Considerations Previous level of function;Severity of impairments   Consulted and Agree with Plan of Care Patient      Patient will benefit from skilled therapeutic intervention in order to improve the following deficits and  impairments:   Dysarthria and anarthria  Cognitive communication deficit    Problem List Patient Active Problem List   Diagnosis Date Noted  . CAP (community acquired pneumonia) 04/13/2016  . PNA (pneumonia) 04/13/2016  . Murmur 09/02/2015  . Parkinson disease ([place]) 09/02/2015  . Bilateral carotid bruits 09/02/2015  . Confusion 08/07/2015  . Acute encephalopathy 08/07/2015  . UTI (lower urinary tract infection) 08/07/2015  . Acute kidney failure ([place]) 08/07/2015  . Paralysis agitans ([place]) 09/19/2012  . Lymphoma ([place]) 07/03/2011  . Malignant neoplasm of female breast ([place]) 07/03/2011  . Abnormal CT of the chest 03/13/2011  . HYPERTENSION, BENIGN 01/01/2009  . EDEMA 01/01/2009    Lovvorn, Annye Rusk MS, CCC-SLP 12/10/2016, 1:19 PM  [facility] [address] [place], [name], [address] Phone: [phone]   Fax:  [phone]   Name: [name] MRN: [id number] Date of Birth: 1928-02-23

## 2016-12-15 ENCOUNTER — Telehealth: Payer: Self-pay | Admitting: Oncology

## 2016-12-15 ENCOUNTER — Other Ambulatory Visit (HOSPITAL_BASED_OUTPATIENT_CLINIC_OR_DEPARTMENT_OTHER): Payer: Medicare Other

## 2016-12-15 ENCOUNTER — Ambulatory Visit (HOSPITAL_BASED_OUTPATIENT_CLINIC_OR_DEPARTMENT_OTHER): Payer: Medicare Other | Admitting: Oncology

## 2016-12-15 VITALS — BP 171/86 | HR 77 | Temp 98.8°F | Resp 18 | Ht 62.0 in | Wt 126.3 lb

## 2016-12-15 DIAGNOSIS — Z170 Estrogen receptor positive status [ER+]: Principal | ICD-10-CM

## 2016-12-15 DIAGNOSIS — C50911 Malignant neoplasm of unspecified site of right female breast: Secondary | ICD-10-CM

## 2016-12-15 DIAGNOSIS — C50912 Malignant neoplasm of unspecified site of left female breast: Principal | ICD-10-CM

## 2016-12-15 DIAGNOSIS — C8590 Non-Hodgkin lymphoma, unspecified, unspecified site: Secondary | ICD-10-CM

## 2016-12-15 DIAGNOSIS — Z853 Personal history of malignant neoplasm of breast: Secondary | ICD-10-CM | POA: Diagnosis not present

## 2016-12-15 LAB — CBC WITH DIFFERENTIAL/PLATELET
BASO%: 0.9 % (ref 0.0–2.0)
BASOS ABS: 0 10*3/uL (ref 0.0–0.1)
EOS%: 1.8 % (ref 0.0–7.0)
Eosinophils Absolute: 0.1 10*3/uL (ref 0.0–0.5)
HEMATOCRIT: 33.1 % — AB (ref 34.8–46.6)
HEMOGLOBIN: 10.6 g/dL — AB (ref 11.6–15.9)
LYMPH#: 0.9 10*3/uL (ref 0.9–3.3)
LYMPH%: 20.1 % (ref 14.0–49.7)
MCH: 26.3 pg (ref 25.1–34.0)
MCHC: 32 g/dL (ref 31.5–36.0)
MCV: 82.1 fL (ref 79.5–101.0)
MONO#: 0.3 10*3/uL (ref 0.1–0.9)
MONO%: 6.4 % (ref 0.0–14.0)
NEUT#: 3.1 10*3/uL (ref 1.5–6.5)
NEUT%: 70.8 % (ref 38.4–76.8)
Platelets: 133 10*3/uL — ABNORMAL LOW (ref 145–400)
RBC: 4.03 10*6/uL (ref 3.70–5.45)
RDW: 15.8 % — ABNORMAL HIGH (ref 11.2–14.5)
WBC: 4.4 10*3/uL (ref 3.9–10.3)
nRBC: 0 % (ref 0–0)

## 2016-12-15 LAB — COMPREHENSIVE METABOLIC PANEL
ALBUMIN: 4 g/dL (ref 3.5–5.0)
ALK PHOS: 97 U/L (ref 40–150)
ALT: 9 U/L (ref 0–55)
AST: 21 U/L (ref 5–34)
Anion Gap: 10 mEq/L (ref 3–11)
BILIRUBIN TOTAL: 0.28 mg/dL (ref 0.20–1.20)
BUN: 17 mg/dL (ref 7.0–26.0)
CALCIUM: 9.5 mg/dL (ref 8.4–10.4)
CO2: 29 mEq/L (ref 22–29)
CREATININE: 1.1 mg/dL (ref 0.6–1.1)
Chloride: 106 mEq/L (ref 98–109)
EGFR: 45 mL/min/{1.73_m2} — AB (ref >90)
Glucose: 100 mg/dl (ref 70–140)
POTASSIUM: 4.4 meq/L (ref 3.5–5.1)
SODIUM: 145 meq/L (ref 136–145)
Total Protein: 6.2 g/dL — ABNORMAL LOW (ref 6.4–8.3)

## 2016-12-15 NOTE — Telephone Encounter (Signed)
Appointments scheduled per 12/15/16 los. Patient was given a copy of the AVS report and appointment schedule per 12/15/16 los.

## 2016-12-15 NOTE — Progress Notes (Signed)
[facility] OFFICE PROGRESS NOTE   Diagnosis: Non-Hodgkin's lymphoma, breast cancer  INTERVAL HISTORY:   [name] returns as scheduled. She is undergoing neurology rehabilitation for Parkinson's disease. Her husband report she has developed progressive symptoms related to Parkinson's disease including some confusion. She reports exertional dyspnea.  Objective:  Vital signs in last 24 hours:  Blood pressure (!) 171/86, pulse 77, temperature 98.8 F (37.1 C), temperature source Oral, resp. rate 18, height 5' 2"  (1.575 m), weight 126 lb 4.8 oz (57.3 kg), SpO2 98 %.    HEENT: Neck without mass Lymphatics: No cervical, supraclavicular, axillary, or inguinal nodes Resp: Inspiratory rhonchi at the extreme posterior base bilaterally, no respiratory distress Cardio: Regular rate and rhythm GI: No hepatosplenomegaly, no mass, nontender Vascular: No leg edema Breasts: Status post bilateral mastectomy. No evidence for chest wall tumor recurrence.   Portacath/PICC-without erythema  Lab Results:  Lab Results  Component Value Date   WBC 4.4 12/15/2016   HGB 10.6 (L) 12/15/2016   HCT 33.1 (L) 12/15/2016   MCV 82.1 12/15/2016   PLT 133 (L) 12/15/2016   NEUTROABS 3.1 12/15/2016    CMP     Component Value Date/Time   NA 145 12/15/2016 1140   K 4.4 12/15/2016 1140   CL 107 04/16/2016 0550   CL 106 12/26/2012 1110   CO2 29 12/15/2016 1140   GLUCOSE 100 12/15/2016 1140   GLUCOSE 108 (H) 12/26/2012 1110   BUN 17.0 12/15/2016 1140   CREATININE 1.1 12/15/2016 1140   CALCIUM 9.5 12/15/2016 1140   PROT 6.2 (L) 12/15/2016 1140   ALBUMIN 4.0 12/15/2016 1140   AST 21 12/15/2016 1140   ALT 9 12/15/2016 1140   ALKPHOS 97 12/15/2016 1140   BILITOT 0.28 12/15/2016 1140   GFRNONAA 55 (L) 04/16/2016 0550   GFRAA >60 04/16/2016 0550     Medications: I have reviewed the patient's current medications.  Assessment/Plan: 1. Non-Hodgkin lymphoma treated with  fludarabine/rituximab, last in July of 2009.  1. Restaging CT 11/24/2010 revealed evidence of progressive lymphoma in the abdomen and pelvis.  2. Initiation of salvage therapy with bendamustine/rituximab 12/04/2010. She completed 3 cycles.  3. Restaging CT 03/03/2011 revealed stable retroperitoneal soft tissue and a marked decrease in the soft tissue thickening associated with dilated loops of small bowel. 2. History of ITP. Intermittent mild thrombocytopenia persists. 3. History of Herpes zoster-maintained on prophylactic acyclovir.  4. Anemia secondary to non-Hodgkin lymphoma, chemotherapy, and a history of autoimmune hemolysis.  5. Hypertension. 6. Right-hand tremor/ataxia-followed by neurology, now taking Sinemet, diagnosed with Parkinson's disease 7. Hearing loss-status post placement of an implanted hearing device by Dr. Cresenciano Lick. 8. Bilateral breast cancer, right breast cancer-grade 1, T1 N0, ER/PR positive, and HER2 negative. Left breast cancer, synchronous grade 2, T2 N0, ER/PR positive, and HER2 negative. She began adjuvant Arimidex on 09/15/2010,completed February 2017 9. Inflammatory changes of both lungs on a CT of the chest 11/24/2010-progressive on the restaging CT 03/03/2011, status post a bronchoscopy 03/17/2011 with no evidence of granulomata or tumor. 10. 11 mm spiculated lesion in the lingula on a CT 11/24/2010-less distinct on the CT 03/03/2011. 11. Upper respiratory infection while visiting her son in [place], June 2013, resolved after treatment with Levaquin/steroids 12. Upper respiratory infection,? Pneumonia summer 2014-chest x-ray 02/17/2013 with bilateral infiltrates 13. CT chest abdomen and pelvis on 11/29/2014 with no evidence of active lymphoma or metastatic breast cancer. Previously demonstrated retroperitoneal process appeared improved with probable residual fibrosis. No discrete adenopathy. Interval near  complete resolution of patchy airspace opacities in both  lungs. 14. Mild swelling and pain at the right lower leg 08/06/2016-negative for acute deep vein thrombosis, possible chronic thrombus in the right popliteal vein    Disposition:  She remains in clinical remission from him foam a and breast cancer. She is stable from a hematologic standpoint. [name] will return for an office and lab visit in 4 months. 15 minutes were spent with the patient today. The majority of the time was used for counseling and coordination of care. Betsy Coder, MD  12/15/2016  12:16 PM

## 2016-12-16 ENCOUNTER — Ambulatory Visit: Payer: Medicare Other

## 2016-12-16 ENCOUNTER — Ambulatory Visit: Payer: Medicare Other | Admitting: Occupational Therapy

## 2016-12-16 DIAGNOSIS — R41844 Frontal lobe and executive function deficit: Secondary | ICD-10-CM | POA: Diagnosis not present

## 2016-12-16 DIAGNOSIS — R29818 Other symptoms and signs involving the nervous system: Secondary | ICD-10-CM | POA: Diagnosis not present

## 2016-12-16 DIAGNOSIS — R278 Other lack of coordination: Secondary | ICD-10-CM

## 2016-12-16 DIAGNOSIS — R471 Dysarthria and anarthria: Secondary | ICD-10-CM | POA: Diagnosis not present

## 2016-12-16 DIAGNOSIS — R41841 Cognitive communication deficit: Secondary | ICD-10-CM | POA: Diagnosis not present

## 2016-12-16 DIAGNOSIS — R293 Abnormal posture: Secondary | ICD-10-CM | POA: Diagnosis not present

## 2016-12-16 NOTE — Patient Instructions (Signed)
  Please complete some of the speech homework for loudness, for 30 minutes each day, prior to your next session.

## 2016-12-16 NOTE — Patient Instructions (Signed)
Coordination Exercises  Perform the following exercises for 20 minutes 1 times per day. Perform with both hand(s). Perform using big movements.   Flipping Cards: Place deck of cards on the table. Flip cards over by opening your hand big to grasp and then turn your palm up big.  Deal cards: Hold 1/2 or whole deck in your hand. Use thumb to push card off top of deck with one big push.  Pick up coins and stack one at a time: Pick up with big, intentional movements. Do not drag coin to the edge. (5-10 in a stack)  Pick up 5-10 coins one at a time and hold in palm. Then, move coins from palm to fingertips one at time and place in coin bank/container.

## 2016-12-16 NOTE — Therapy (Signed)
[facility] [address] [place], [name], [address] Phone: [phone]   Fax:  [phone]  Speech Language Pathology Treatment  Patient Details  Name: [name] MRN: [id number] Date of Birth: [birth year] Referring Provider: Alonza Bogus, D.O.  Encounter Date: 12/16/2016      End of Session - 12/16/16 1127    Visit Number 9   Number of Visits 17   Date for SLP Re-Evaluation 12/25/16   SLP Start Time 1104   SLP Stop Time  1145   SLP Time Calculation (min) 41 min   Activity Tolerance Patient tolerated treatment well      Past Medical History:  Diagnosis Date  . Anxiety   . Asthma   . Depression   . Diabetes mellitus without complication ([place])    2/44/62.Marland KitchenMarland Kitchenpt denies  . GERD (gastroesophageal reflux disease)   . Hearing loss   . NHL (non-Hodgkin's lymphoma) ([place])    nhl dx 9/04 breast ca dx1/12  . Parkinson disease [facility])     Past Surgical History:  Procedure Laterality Date  . Ba-HA Ear implant    . MASTECTOMY  2 /8/ 12   bilateral  . MASTECTOMY      There were no vitals filed for this visit.      Subjective Assessment - 12/16/16 1111    Subjective Pt cont to clear throat during session despite SLP encouraging her to have H2O sips instead.   Currently in Pain? No/denies               ADULT SLP TREATMENT - 12/16/16 1113      General Information   Behavior/Cognition Alert;Cooperative;Pleasant mood     Treatment Provided   Treatment provided Cognitive-Linquistic     Cognitive-Linquistic Treatment   Treatment focused on Dysarthria   Skilled Treatment Loud /a/ used to recalibrate pt's speech system to incr muscle memory for WNL conversational volume. She averaged upper 80s dB with occasional min verbal cues for loudness. Structured speech tasks (sentence responses) with average of upper 60s dB and occasional visual and verbal cues for volume in speech. Incr'd linguistic  complexity/cognitive load resulted in decr'd volumes, Pt also req'd cues from SLP for memory for task completion - she provided SLP A for item recall, occasionally. In simple conversation pt maintained average 68dB, and incr'd slightly with verbal and non-verbal cues to incr volume.     Assessment / Recommendations / Plan   Plan Continue with current plan of care     Progression Toward Goals   Progression toward goals Not progressing toward goals (comment)  Pt's decr'd cognition hinders progress somewhat          SLP Education - 12/16/16 1145    Education provided Yes   Education Details Needs to ALWAYS feel more effort with speaking   Person(s) Educated Patient   Methods Explanation;Demonstration;Verbal cues   Comprehension Verbal cues required;Returned demonstration;Verbalized understanding;Need further instruction          SLP Short Term Goals - 12/16/16 1313      SLP SHORT TERM GOAL #1   Title Pt will demonstrate sustained /a/ of 85dB with rare min A over 3 sessions   Status Achieved     SLP SHORT TERM GOAL #2   Title Pt will maintain average of 70dB during structured speech tasks with occassional min A over 3 sessions    Status Not Met     SLP SHORT TERM GOAL #3   Status Achieved  SLP Long Term Goals - 12/16/16 1314      SLP LONG TERM GOAL #1   Title Maintain an average of 70dB during conversation over 8 minutes with rare min A   Time 4   Period Weeks   Status Revised     SLP LONG TERM GOAL #2   Title Converse audilbly in a noisy environment for 8 minutes with rare min A    Time 4   Period Weeks   Status On-going     SLP LONG TERM GOAL #3   Title pt will report husband asking less for her to repeat, than before ST   Time 4   Period Weeks   Status On-going          Plan - 12/16/16 1159    Clinical Impression Statement (P)  Pt continues to demonstrate reduced volume and intelligibility in conversation and structured tasks with simple and  simple-mod complex cognitive load. In sentnece responses she cont to require occasional min-mod A to incr her volume to WNL. Continue skilled ST to maximize volume in conversation for improved intelligibility.      Patient will benefit from skilled therapeutic intervention in order to improve the following deficits and impairments:   Dysarthria and anarthria  Cognitive communication deficit    Problem List Patient Active Problem List   Diagnosis Date Noted  . CAP (community acquired pneumonia) 04/13/2016  . PNA (pneumonia) 04/13/2016  . Murmur 09/02/2015  . Parkinson disease ([place]) 09/02/2015  . Bilateral carotid bruits 09/02/2015  . Confusion 08/07/2015  . Acute encephalopathy 08/07/2015  . UTI (lower urinary tract infection) 08/07/2015  . Acute kidney failure ([place]) 08/07/2015  . Paralysis agitans ([place]) 09/19/2012  . Lymphoma ([place]) 07/03/2011  . Malignant neoplasm of female breast ([place]) 07/03/2011  . Abnormal CT of the chest 03/13/2011  . HYPERTENSION, BENIGN 01/01/2009  . EDEMA 01/01/2009    [facility] ,MS, CCC-SLP  12/16/2016, 1:15 PM  [facility] [address] [place], [name], [address] Phone: [phone]   Fax:  [phone]   Name: Keltie Labell MRN: [id number] Date of Birth: January 13, 1928

## 2016-12-16 NOTE — Therapy (Signed)
[facility] [address] [place], [name], [address] Phone: [phone]   Fax:  [phone]  Occupational Therapy Treatment  Patient Details  Name: [name] MRN: [id number] Date of Birth: 05-22-1928 Referring Provider: Dr. [name]  Encounter Date: 12/16/2016      OT End of Session - 12/16/16 1313    Visit Number 12   Number of Visits 17   Date for OT Re-Evaluation 12/20/16   Authorization Type MCR, UHC secondary   Authorization - Visit Number 12   Authorization - Number of Visits 20   OT Start Time 1030  pt late   OT Stop Time 1100   OT Time Calculation (min) 30 min   Activity Tolerance Patient tolerated treatment well   Behavior During Therapy [facility] for tasks assessed/performed      Past Medical History:  Diagnosis Date  . Anxiety   . Asthma   . Depression   . Diabetes mellitus without complication ([place])    4/43/15.Marland KitchenMarland Kitchenpt denies  . GERD (gastroesophageal reflux disease)   . Hearing loss   . NHL (non-Hodgkin's lymphoma) ([place])    nhl dx 9/04 breast ca dx1/12  . Parkinson disease [facility])     Past Surgical History:  Procedure Laterality Date  . Ba-HA Ear implant    . MASTECTOMY  2 /8/ 12   bilateral  . MASTECTOMY      There were no vitals filed for this visit.      Subjective Assessment - 12/16/16 1312    Subjective  Pt reports she has not been perfoming bag exercises at home   Pertinent History Lumbar radiofrequency ablation (to treat lumbar facet joint pain)-last one 08/27/16 with relief of back pain; dementia due to Parkinson's; DVT (07/2016)   Patient Stated Goals improve independence with daily activities   Currently in Pain? No/denies                              OT Education - 12/16/16 1314    Education provided Yes   Education Details coordination HEP, basic- see pt instructions, reviewed bag exercise HEP   Person(s) Educated Patient   Methods  Explanation;Demonstration;Verbal cues;Handout   Comprehension Verbalized understanding;Returned demonstration;Verbal cues required          OT Short Term Goals - 11/27/16 1301      OT SHORT TERM GOAL #1   Title I with PD specific HEP .- 11/20/16   Time 4   Period Weeks   Status Achieved  needs min v.c for bag exercises, pt's husband is able to assist     OT SHORT TERM GOAL #2   Title Pt will demonstrate understanding of adapted strategies for ADLs/IADLs.   Time 4   Period Weeks   Status On-going  needs reinforcement     OT SHORT TERM GOAL #3   Title Pt will demonstrate ability to write a short paragraph with 100% legibility and only minimal decrease in letter size.   Time 4   Period Weeks   Status Achieved  11/16/16     OT SHORT TERM GOAL #4   Title Pt will demonstrate improved bilateral UE functional use as evidenced by increasing box/ blocks score by 3 blocks.   Time 4   Period Weeks   Status Achieved  RUE 49, LUE 50     OT SHORT TERM GOAL #5   Title Pt will increase right shoulder flexion to 125*  with -15 elbow ext to retrieve a lightweight object.   Baseline sh flexion: 120, elbow ext -20   Time 4   Period Weeks   Status Achieved  125,-15           OT Long Term Goals - 12/07/16 1158      OT LONG TERM GOAL #1   Title Pt will demonstrate improved safety for ADLs as evidenced by increasing bilateral standing functional reach to 10 inches or greater.- due 10/19/16   Time 8   Period Weeks   Status On-going  12/07/16:  8" bilaterally     OT LONG TERM GOAL #2   Title Pt will improve R dominant hand coordination for ADLs as shown by improving 9-hole peg test score by at least 3sec.    Baseline R 31.41 secs, LUE 30.32 secs   Time 8   Period Weeks   Status On-going  12/07/16:  28.97sec     OT LONG TERM GOAL #3   Title Pt will demonstrate improved ease with fastening buttons as evidenced by decreasing 3 button/ unbutton to 32 secs or less.   Baseline 36.72  secs   Time 8   Period Weeks   Status On-going  12/07/16:  36.75     OT LONG TERM GOAL #4   Title Pt will verbalize understanding of memory, visual compensation strategies for ADLs prn.   Time 8   Period Weeks   Status Achieved  12/03/16  met     OT LONG TERM GOAL #5   Title --------------------------------------------------------------------------------------------------------               Plan - 12/16/16 1315    Clinical Impression Statement Pt is progressing towards goals lsowly. She remains limited by decreased endurance and cognitve deficits requiring frequent v.c   Rehab Potential Good   Clinical Impairments Affecting Rehab Potential cognitive deficits   OT Frequency 2x / week   OT Duration 8 weeks   OT Treatment/Interventions Self-care/ADL training;DME and/or AE instruction;Splinting;Patient/family education;Balance training;Therapeutic exercises;Ultrasound;Therapeutic exercise;Therapeutic activities;Cognitive remediation/compensation;Passive range of motion;Neuromuscular education;Cryotherapy;Electrical Stimulation;Parrafin;Energy conservation;Manual Therapy;Visual/perceptual remediation/compensation   Plan work towards long term goals, dynamic step and reach   OT Home Exercise Plan bag exercises issued, basic coordination   Consulted and Agree with Plan of Care Patient      Patient will benefit from skilled therapeutic intervention in order to improve the following deficits and impairments:  Abnormal gait, Decreased cognition, Impaired flexibility, Pain, Decreased mobility, Decreased coordination, Decreased activity tolerance, Decreased endurance, Decreased range of motion, Decreased strength, Impaired UE functional use, Difficulty walking, Decreased safety awareness, Decreased knowledge of precautions, Decreased balance  Visit Diagnosis: Other symptoms and signs involving the nervous system  Abnormal posture  Frontal lobe and executive function deficit  Other  lack of coordination    Problem List Patient Active Problem List   Diagnosis Date Noted  . CAP (community acquired pneumonia) 04/13/2016  . PNA (pneumonia) 04/13/2016  . Murmur 09/02/2015  . Parkinson disease ([place]) 09/02/2015  . Bilateral carotid bruits 09/02/2015  . Confusion 08/07/2015  . Acute encephalopathy 08/07/2015  . UTI (lower urinary tract infection) 08/07/2015  . Acute kidney failure ([place]) 08/07/2015  . Paralysis agitans ([place]) 09/19/2012  . Lymphoma ([place]) 07/03/2011  . Malignant neoplasm of female breast ([place]) 07/03/2011  . Abnormal CT of the chest 03/13/2011  . HYPERTENSION, BENIGN 01/01/2009  . EDEMA 01/01/2009    Rahman Ferrall 12/16/2016, 1:16 PM Theone Murdoch, OTR/L [phone] Phone: [phone] 1:18 PM 12/16/16 [facility] [address] [place], [name], [address] Phone: [phone]   Fax:  [phone]  Name: Branna Cortina MRN: [id number] Date of Birth: 06/03/1928

## 2016-12-17 ENCOUNTER — Ambulatory Visit: Payer: Medicare Other

## 2016-12-17 ENCOUNTER — Ambulatory Visit: Payer: Medicare Other | Admitting: Occupational Therapy

## 2016-12-17 DIAGNOSIS — R29818 Other symptoms and signs involving the nervous system: Secondary | ICD-10-CM

## 2016-12-17 DIAGNOSIS — R41844 Frontal lobe and executive function deficit: Secondary | ICD-10-CM | POA: Diagnosis not present

## 2016-12-17 DIAGNOSIS — R293 Abnormal posture: Secondary | ICD-10-CM | POA: Diagnosis not present

## 2016-12-17 DIAGNOSIS — R41841 Cognitive communication deficit: Secondary | ICD-10-CM

## 2016-12-17 DIAGNOSIS — R471 Dysarthria and anarthria: Secondary | ICD-10-CM | POA: Diagnosis not present

## 2016-12-17 DIAGNOSIS — M6281 Muscle weakness (generalized): Secondary | ICD-10-CM

## 2016-12-17 DIAGNOSIS — R278 Other lack of coordination: Secondary | ICD-10-CM

## 2016-12-17 DIAGNOSIS — R2681 Unsteadiness on feet: Secondary | ICD-10-CM

## 2016-12-17 DIAGNOSIS — R2689 Other abnormalities of gait and mobility: Secondary | ICD-10-CM

## 2016-12-17 NOTE — Therapy (Signed)
[facility] [address] [place], [name], [address] Phone: [phone]   Fax:  [phone]  Occupational Therapy Treatment  Patient Details  Name: [name] MRN: [id number] Date of Birth: Feb 27, 1928 Referring Provider: Dr. [name]  Encounter Date: 12/17/2016      OT End of Session - 12/17/16 1111    Visit Number 13   Number of Visits 17   Date for OT Re-Evaluation 12/20/16   Authorization Type MCR, UHC secondary   Authorization - Visit Number 13   Authorization - Number of Visits 20   OT Start Time 1107   OT Stop Time 1147   OT Time Calculation (min) 40 min   Activity Tolerance Patient tolerated treatment well   Behavior During Therapy [facility] for tasks assessed/performed      Past Medical History:  Diagnosis Date  . Anxiety   . Asthma   . Depression   . Diabetes mellitus without complication ([place])    5/68/12.Marland KitchenMarland Kitchenpt denies  . GERD (gastroesophageal reflux disease)   . Hearing loss   . NHL (non-Hodgkin's lymphoma) ([place])    nhl dx 9/04 breast ca dx1/12  . Parkinson disease [facility])     Past Surgical History:  Procedure Laterality Date  . Ba-HA Ear implant    . MASTECTOMY  2 /8/ 12   bilateral  . MASTECTOMY      There were no vitals filed for this visit.      Subjective Assessment - 12/17/16 1110    Subjective  doing ok   Pertinent History Lumbar radiofrequency ablation (to treat lumbar facet joint pain)-last one 08/27/16 with relief of back pain; dementia due to Parkinson's; DVT (07/2016)   Patient Stated Goals improve independence with daily activities   Currently in Pain? No/denies        In sitting, placing small pegs in pegboard to copy design with min-mod difficulty/cues particularlyinitially.  Pt with min difficulty with coordination and given min cues for compensation.  Gave cueing for [employer] Hands, but was to cognitively demanding for pt to carryover/remember.    In standing, attempted  functional step and reach to the side; however, pt unable to perform due to difficulty with cognition.  Therefore, functional reaching with wt. Shift back and to the side with each UE to flip large cards with focus on coordination of UE/LE, mod v.c. Given for large amplitude  Arm bike x12mn 40sec level 1 for reciprocal movement with cues/target of at least 35-40rpms for intensity while maintaining movement amplitude/reciprocal movement.   Pt maintained 32rpms.  Dealing cards with each hand with mod difficulty/cues for large amplitude movements.  Stacking coins with each hand with min difficulty.  Then, manipulating in-hand to place in coin bank 1 at a time.                           OT Short Term Goals - 11/27/16 1301      OT SHORT TERM GOAL #1   Title I with PD specific HEP .- 11/20/16   Time 4   Period Weeks   Status Achieved  needs min v.c for bag exercises, pt's husband is able to assist     OT SHORT TERM GOAL #2   Title Pt will demonstrate understanding of adapted strategies for ADLs/IADLs.   Time 4   Period Weeks   Status On-going  needs reinforcement     OT SHORT TERM GOAL #3   Title Pt  will demonstrate ability to write a short paragraph with 100% legibility and only minimal decrease in letter size.   Time 4   Period Weeks   Status Achieved  11/16/16     OT SHORT TERM GOAL #4   Title Pt will demonstrate improved bilateral UE functional use as evidenced by increasing box/ blocks score by 3 blocks.   Time 4   Period Weeks   Status Achieved  RUE 49, LUE 50     OT SHORT TERM GOAL #5   Title Pt will increase right shoulder flexion to 125* with -15 elbow ext to retrieve a lightweight object.   Baseline sh flexion: 120, elbow ext -20   Time 4   Period Weeks   Status Achieved  125,-15           OT Long Term Goals - 12/17/16 1112      OT LONG TERM GOAL #1   Title Pt will demonstrate improved safety for ADLs as evidenced by increasing bilateral  standing functional reach to 10 inches or greater.- due 12/20/16   Time 8   Period Weeks   Status On-going  12/07/16:  8" bilaterally     OT LONG TERM GOAL #2   Title Pt will improve R dominant hand coordination for ADLs as shown by improving 9-hole peg test score by at least 3sec.    Baseline R 31.41 secs, LUE 30.32 secs   Time 8   Period Weeks   Status On-going  12/07/16:  28.97sec     OT LONG TERM GOAL #3   Title Pt will demonstrate improved ease with fastening buttons as evidenced by decreasing 3 button/ unbutton to 32 secs or less.   Baseline 36.72 secs   Time 8   Period Weeks   Status On-going  12/07/16:  36.75     OT LONG TERM GOAL #4   Title Pt will verbalize understanding of memory, visual compensation strategies for ADLs prn.   Time 8   Period Weeks   Status Achieved  12/03/16  met     OT LONG TERM GOAL #5   Title --------------------------------------------------------------------------------------------------------               Plan - 12/17/16 1111    Clinical Impression Statement Pt is progressing slowly towards goals.  Decr endurance and cognitive deficits remain a barrier to carryover.  In addition, pt appeared to demo incr difficulty cognitively today and with hearing due to busy environment.   Rehab Potential Good   Clinical Impairments Affecting Rehab Potential cognitive deficits   OT Frequency 2x / week   OT Duration 8 weeks   OT Treatment/Interventions Self-care/ADL training;DME and/or AE instruction;Splinting;Patient/family education;Balance training;Therapeutic exercises;Ultrasound;Therapeutic exercise;Therapeutic activities;Cognitive remediation/compensation;Passive range of motion;Neuromuscular education;Cryotherapy;Electrical Stimulation;Parrafin;Energy conservation;Manual Therapy;Visual/perceptual remediation/compensation   Plan continue to work toward [place], renew?   OT Home Exercise Plan bag exercises issued, basic coordination   Consulted and  Agree with Plan of Care Patient      Patient will benefit from skilled therapeutic intervention in order to improve the following deficits and impairments:  Abnormal gait, Decreased cognition, Impaired flexibility, Pain, Decreased mobility, Decreased coordination, Decreased activity tolerance, Decreased endurance, Decreased range of motion, Decreased strength, Impaired UE functional use, Difficulty walking, Decreased safety awareness, Decreased knowledge of precautions, Decreased balance  Visit Diagnosis: Other symptoms and signs involving the nervous system  Abnormal posture  Frontal lobe and executive function deficit  Other lack of coordination  Unsteadiness on feet  Other abnormalities of gait  and mobility  Muscle weakness (generalized)    Problem List Patient Active Problem List   Diagnosis Date Noted  . CAP (community acquired pneumonia) 04/13/2016  . PNA (pneumonia) 04/13/2016  . Murmur 09/02/2015  . Parkinson disease ([place]) 09/02/2015  . Bilateral carotid bruits 09/02/2015  . Confusion 08/07/2015  . Acute encephalopathy 08/07/2015  . UTI (lower urinary tract infection) 08/07/2015  . Acute kidney failure ([place]) 08/07/2015  . Paralysis agitans ([place]) 09/19/2012  . Lymphoma ([place]) 07/03/2011  . Malignant neoplasm of female breast ([place]) 07/03/2011  . Abnormal CT of the chest 03/13/2011  . HYPERTENSION, BENIGN 01/01/2009  . EDEMA 01/01/2009    [facility] 12/17/2016, 11:55 AM  [facility] [address] [place], [name], [address] Phone: [phone]   Fax:  [phone]  Name: [name] MRN: [id number] Date of Birth: 03-16-1928   Vianne Bulls, OTR/L [facility] [address] [place], [place]  [address] [phone] phone [phone] 12/17/16 11:55 AM

## 2016-12-17 NOTE — Therapy (Signed)
[facility] [address] [place], [name], [address] Phone: [phone]   Fax:  [phone]  Speech Language Pathology Treatment  Patient Details  Name: [name] MRN: [id number] Date of Birth: [birth year] Referring Provider: Alonza Bogus, D.O.  Encounter Date: 12/17/2016      End of Session - 12/17/16 1731    Visit Number 10   Number of Visits 17   Date for SLP Re-Evaluation 12/25/16   SLP Start Time 1018   SLP Stop Time  1100   SLP Time Calculation (min) 42 min   Activity Tolerance Patient tolerated treatment well      Past Medical History:  Diagnosis Date  . Anxiety   . Asthma   . Depression   . Diabetes mellitus without complication ([place])    9/79/89.Marland KitchenMarland Kitchenpt denies  . GERD (gastroesophageal reflux disease)   . Hearing loss   . NHL (non-Hodgkin's lymphoma) ([place])    nhl dx 9/04 breast ca dx1/12  . Parkinson disease [facility])     Past Surgical History:  Procedure Laterality Date  . Ba-HA Ear implant    . MASTECTOMY  2 /8/ 12   bilateral  . MASTECTOMY      There were no vitals filed for this visit.      Subjective Assessment - 12/17/16 1029    Subjective Husband states he is having to routinely remind pt to incr loudness at home. "We got some homework done but we were out all day yesterday and came back exhausted."   Currently in Pain? No/denies               ADULT SLP TREATMENT - 12/17/16 1030      General Information   Behavior/Cognition Alert;Cooperative;Pleasant mood     Treatment Provided   Treatment provided Cognitive-Linquistic     Cognitive-Linquistic Treatment   Treatment focused on Dysarthria   Skilled Treatment SLP used loud /a/ to habituate the motor and auditory cues needed to incr in pt's conversational volume. Pt's average volume was mid 80s dB with consistent min-mod A for loudness. Pt req'd occasional mod A for loudness in structured tasks today achieving upper  60s-70dB. Pt noted to clear throat throughout session despite SLP commenting x3 that pt should consider sipping H2O (sitting next to pt) instead of clearing due to decr'd vocal hygiene with repeated throat clear. Pt loudness decr'd considerably with mod copmlex word level responses (synonyms) - to mid 60s dB average.      Assessment / Recommendations / Plan   Plan Continue with current plan of care     Progression Toward Goals   Progression toward goals Not progressing toward goals (comment)          SLP Education - 12/16/16 1145    Education provided Yes   Education Details Needs to ALWAYS feel more effort with speaking   Person(s) Educated Patient   Methods Explanation;Demonstration;Verbal cues   Comprehension Verbal cues required;Returned demonstration;Verbalized understanding;Need further instruction          SLP Short Term Goals - 12/16/16 1313      SLP SHORT TERM GOAL #1   Title Pt will demonstrate sustained /a/ of 85dB with rare min A over 3 sessions   Status Achieved     SLP SHORT TERM GOAL #2   Title Pt will maintain average of 70dB during structured speech tasks with occassional min A over 3 sessions    Status Not Met     SLP SHORT TERM  GOAL #3   Status Achieved          SLP Long Term Goals - 01/09/2017 1734      SLP LONG TERM GOAL #1   Title Maintain an average of 70dB during conversation over 8 minutes with rare min A   Time 4   Period Weeks   Status Revised     SLP LONG TERM GOAL #2   Title Converse audilbly in a noisy environment for 8 minutes with rare min A    Time 4   Period Weeks   Status On-going     SLP LONG TERM GOAL #3   Title pt will report husband asking less for her to repeat, than before ST   Time 4   Period Weeks   Status On-going          Plan - Jan 09, 2017 1732    Clinical Impression Statement Pt continues to demonstrate reduced volume and intelligibility in conversation and structured tasks at phrase/sentence level with simple  and simple-mod complex cognitive load, and word level with mod cognitive load. Continue skilled ST to maximize volume in conversation for improved intelligibility will likely occur for 2-3 more sesions, as pt plateau is likely.   Speech Therapy Frequency 2x / week   Duration --  8 wees   Treatment/Interventions Compensatory strategies;Patient/family education;Functional tasks;Cueing hierarchy;SLP instruction and feedback;Internal/external aids;Cognitive reorganization   Potential to Achieve Goals Fair   Potential Considerations Previous level of function;Severity of impairments      Patient will benefit from skilled therapeutic intervention in order to improve the following deficits and impairments:   Dysarthria and anarthria  Cognitive communication deficit      G-Codes - Jan 09, 2017 1734    Functional Assessment Tool Used noms   Functional Limitations Motor speech   Motor Speech Current Status [id number]) At least 40 percent but less than 60 percent impaired, limited or restricted   Motor Speech Goal Status (W8032) At least 40 percent but less than 60 percent impaired, limited or restricted      Speech Therapy Progress Note  Dates of Reporting Period: 10-15-16 to 09-Jan-2017   Subjective Statement: Pt has been seen for 10 visits focusing on pt loudness in conversation  Objective Measurements: Pt's loud /a/ has improved however her loudness in spontaneous speech is highly dependent upon cognitive load. See skilled intervention for details.  Goal Update: see goal section above  Plan: pt will be seen for 2-3 more visits addressing above goals.  Reason Skilled Services are Required: Ensure pt is consistent with loud /a/ and knows how to practice louder verbal expression post-d/c.   Problem List Patient Active Problem List   Diagnosis Date Noted  . CAP (community acquired pneumonia) 04/13/2016  . PNA (pneumonia) 04/13/2016  . Murmur 09/02/2015  . Parkinson disease ([place]) 09/02/2015  .  Bilateral carotid bruits 09/02/2015  . Confusion 08/07/2015  . Acute encephalopathy 08/07/2015  . UTI (lower urinary tract infection) 08/07/2015  . Acute kidney failure ([place]) 08/07/2015  . Paralysis agitans ([place]) 09/19/2012  . Lymphoma ([place]) 07/03/2011  . Malignant neoplasm of female breast ([place]) 07/03/2011  . Abnormal CT of the chest 03/13/2011  . HYPERTENSION, BENIGN 01/01/2009  . EDEMA 01/01/2009    [facility] ,MS, CCC-SLP  01-09-2017, 5:35 PM  [facility] [address] [place], [name], [address] Phone: [phone]   Fax:  [phone]   Name: Dwayne Begay MRN: [id number] Date of Birth: 06-24-1928

## 2016-12-22 ENCOUNTER — Ambulatory Visit: Payer: Medicare Other | Admitting: Speech Pathology

## 2016-12-22 ENCOUNTER — Ambulatory Visit: Payer: Medicare Other | Admitting: Occupational Therapy

## 2016-12-22 DIAGNOSIS — R29818 Other symptoms and signs involving the nervous system: Secondary | ICD-10-CM | POA: Diagnosis not present

## 2016-12-22 DIAGNOSIS — R278 Other lack of coordination: Secondary | ICD-10-CM | POA: Diagnosis not present

## 2016-12-22 DIAGNOSIS — R41841 Cognitive communication deficit: Secondary | ICD-10-CM | POA: Diagnosis not present

## 2016-12-22 DIAGNOSIS — R41844 Frontal lobe and executive function deficit: Secondary | ICD-10-CM

## 2016-12-22 DIAGNOSIS — R293 Abnormal posture: Secondary | ICD-10-CM | POA: Diagnosis not present

## 2016-12-22 DIAGNOSIS — R471 Dysarthria and anarthria: Secondary | ICD-10-CM | POA: Diagnosis not present

## 2016-12-22 DIAGNOSIS — M6281 Muscle weakness (generalized): Secondary | ICD-10-CM

## 2016-12-22 NOTE — Therapy (Signed)
[facility] [address] [place], [name], [address] Phone: [phone]   Fax:  [phone]  Speech Language Pathology Treatment  Patient Details  Name: [name] MRN: [id number] Date of Birth: June 20, 1928 Referring Provider: Alonza Bogus, D.O.  Encounter Date: 12/22/2016      End of Session - 12/22/16 1312    Visit Number 11   Number of Visits 17   Date for SLP Re-Evaluation 12/25/16   SLP Start Time 69   SLP Stop Time  7106   SLP Time Calculation (min) 42 min   Activity Tolerance Patient tolerated treatment well      Past Medical History:  Diagnosis Date  . Anxiety   . Asthma   . Depression   . Diabetes mellitus without complication ([place])    2/69/48.Marland KitchenMarland Kitchenpt denies  . GERD (gastroesophageal reflux disease)   . Hearing loss   . NHL (non-Hodgkin's lymphoma) ([place])    nhl dx 9/04 breast ca dx1/12  . Parkinson disease [facility])     Past Surgical History:  Procedure Laterality Date  . Ba-HA Ear implant    . MASTECTOMY  2 /8/ 12   bilateral  . MASTECTOMY      There were no vitals filed for this visit.      Subjective Assessment - 12/22/16 1234    Subjective "It could be better, I know it could be better" re: talking to spouse               ADULT SLP TREATMENT - 12/22/16 1234      General Information   Behavior/Cognition Alert;Cooperative;Pleasant mood     Treatment Provided   Treatment provided Cognitive-Linquistic     Pain Assessment   Pain Assessment No/denies pain     Cognitive-Linquistic Treatment   Treatment focused on Dysarthria   Skilled Treatment Recalibrated loudness with loud /a/ - pt took breath before each /a/ with mod I.  Average in mid 80's with mod I.  Structured speech tasks describing unsafe photots with average of 70dB. Generate sentence with 2 given words with average of 68dB to 70dBdB.  Simple conversation averaged high 60's to 71dB with occasional min visual and  verbal cues.      Assessment / Recommendations / Plan   Plan Continue with current plan of care     Progression Toward Goals   Progression toward goals Not progressing toward goals (comment)  pt will continue to require caregiver cues for loudness            SLP Short Term Goals - 12/22/16 1312      SLP SHORT TERM GOAL #1   Title Pt will demonstrate sustained /a/ of 85dB with rare min A over 3 sessions   Status Achieved     SLP SHORT TERM GOAL #2   Title Pt will maintain average of 70dB during structured speech tasks with occassional min A over 3 sessions    Status Not Met     SLP SHORT TERM GOAL #3   Status Achieved          SLP Long Term Goals - 12/22/16 1312      SLP LONG TERM GOAL #1   Title Maintain an average of 70dB during conversation over 8 minutes with rare min A   Time 3   Period Weeks   Status Revised     SLP LONG TERM GOAL #2   Title Converse audilbly in a noisy environment for 8 minutes with rare  min A    Time 3   Period Weeks   Status On-going     SLP LONG TERM GOAL #3   Title pt will report husband asking less for her to repeat, than before ST   Time 3   Period Weeks   Status On-going          Plan - 12/22/16 1311    Clinical Impression Statement Pt continues to demonstrate reduced volume and intelligibility in conversation and structured tasks at phrase/sentence level with simple and simple-mod complex cognitive load, and word level with mod cognitive load. Continue skilled ST to maximize volume in conversation for improved intelligibility will likely occur for 2-3 more sesions, as pt plateau is likely.   Speech Therapy Frequency 2x / week   Treatment/Interventions Compensatory strategies;Patient/family education;Functional tasks;Cueing hierarchy;SLP instruction and feedback;Internal/external aids;Cognitive reorganization   Potential to Achieve Goals Fair   Potential Considerations Previous level of function;Severity of impairments    Consulted and Agree with Plan of Care Patient      Patient will benefit from skilled therapeutic intervention in order to improve the following deficits and impairments:   Dysarthria and anarthria  Cognitive communication deficit    Problem List Patient Active Problem List   Diagnosis Date Noted  . CAP (community acquired pneumonia) 04/13/2016  . PNA (pneumonia) 04/13/2016  . Murmur 09/02/2015  . Parkinson disease ([place]) 09/02/2015  . Bilateral carotid bruits 09/02/2015  . Confusion 08/07/2015  . Acute encephalopathy 08/07/2015  . UTI (lower urinary tract infection) 08/07/2015  . Acute kidney failure ([place]) 08/07/2015  . Paralysis agitans ([place]) 09/19/2012  . Lymphoma ([place]) 07/03/2011  . Malignant neoplasm of female breast ([place]) 07/03/2011  . Abnormal CT of the chest 03/13/2011  . HYPERTENSION, BENIGN 01/01/2009  . EDEMA 01/01/2009    Lovvorn, Annye Rusk MS, CCC-SLP 12/22/2016, 1:14 PM  [facility] [address] [place], [name], [address] Phone: [phone]   Fax:  [phone]   Name: Hermie Reagor MRN: [id number] Date of Birth: 03/05/1928

## 2016-12-22 NOTE — Patient Instructions (Signed)
  Your hearing what others are saying may be affected by slower processing/comprehension of what is being said. Likely combination of hearing and processing.

## 2016-12-23 NOTE — Therapy (Signed)
[facility] [address] [place], [name], [address] Phone: [phone]   Fax:  [phone]  Occupational Therapy Treatment  Patient Details  Name: [name] MRN: [id number] Date of Birth: 1928/04/17 Referring Provider: Dr. [name]  Encounter Date: 12/22/2016      OT End of Session - 12/22/16 1310    Visit Number 14   Number of Visits 17   Date for OT Re-Evaluation 12/20/16   Authorization Type MCR, UHC secondary   Authorization - Visit Number 14   Authorization - Number of Visits 20   OT Start Time 1318   OT Stop Time 1358   OT Time Calculation (min) 40 min   Activity Tolerance Patient tolerated treatment well   Behavior During Therapy [facility] for tasks assessed/performed      Past Medical History:  Diagnosis Date  . Anxiety   . Asthma   . Depression   . Diabetes mellitus without complication ([place])    9/89/21.Marland KitchenMarland Kitchenpt denies  . GERD (gastroesophageal reflux disease)   . Hearing loss   . NHL (non-Hodgkin's lymphoma) ([place])    nhl dx 9/04 breast ca dx1/12  . Parkinson disease [facility])     Past Surgical History:  Procedure Laterality Date  . Ba-HA Ear implant    . MASTECTOMY  2 /8/ 12   bilateral  . MASTECTOMY      There were no vitals filed for this visit.      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 Yes   Pain Score 3    Pain Location Back   Pain Orientation Lower   Pain Descriptors / Indicators Aching   Pain Type Chronic pain   Pain Onset More than a month ago   Pain Frequency Intermittent   Aggravating Factors  overdoing it   Pain Relieving Factors rest   Multiple Pain Sites No              Treatment: Standing to perform dynamic step and reach to each side to retrieve graded clothespins, mod v.c and close supervision/ minguard for balance. Arm bike x 5 mins level 1 for conditioning, pt was able to maintain 25-30 rpm. Dynamic reaching activity in standing to flip large playing cards and place on target, for improved fine motor coordination and dynamic balance,  mod v.c./ min facilitation  for larger amplitude movements due to cognitive deficits.                   OT Short Term Goals - 12/22/16 1320      OT SHORT TERM GOAL #1   Title I with PD specific HEP .- 11/20/16   Time 4   Period Weeks   Status Achieved  needs min v.c for bag exercises, pt's husband is able to assist     OT SHORT TERM GOAL #2   Title Pt will demonstrate understanding of adapted strategies for ADLs/IADLs.   Time 4   Period Weeks   Status On-going  needs reinforcement     OT SHORT TERM GOAL #3   Title Pt will demonstrate ability to write a short paragraph with 100% legibility and only minimal decrease in letter size.  Time 4   Period Weeks   Status Achieved  11/16/16     OT SHORT TERM GOAL #4   Title Pt will demonstrate improved bilateral UE functional use as evidenced by increasing box/ blocks score by 3 blocks.   Time 4   Period Weeks   Status Achieved  RUE 49, LUE 50     OT SHORT TERM GOAL #5   Title Pt will increase right shoulder flexion to 125* with -15 elbow ext to retrieve a lightweight object.   Baseline sh flexion: 120, elbow ext -20   Time 4   Period Weeks   Status Achieved  125,-15           OT Long Term Goals - 12/22/16 1322      OT LONG TERM GOAL #1   Title Pt will demonstrate improved safety for ADLs as evidenced by increasing bilateral standing functional reach to 10 inches or greater.- due 12/20/16   Time 8   Period Weeks   Status On-going  12/07/16:  8" bilaterally     OT LONG TERM GOAL #2   Title Pt will improve R dominant hand  coordination for ADLs as shown by improving 9-hole peg test score by at least 3sec.    Baseline R 31.41 secs, LUE 30.32 secs   Time 8   Period Weeks   Status On-going  12/07/16:  28.97sec     OT LONG TERM GOAL #3   Title Pt will demonstrate improved ease with fastening buttons as evidenced by decreasing 3 button/ unbutton to 32 secs or less.   Baseline 36.72 secs   Time 8   Period Weeks   Status On-going  12/07/16:  36.75     OT LONG TERM GOAL #4   Title Pt will verbalize understanding of memory, visual compensation strategies for ADLs prn.   Time 8   Period Weeks   Status Achieved  12/03/16  met     OT LONG TERM GOAL #5   Title --------------------------------------------------------------------------------------------------------               Plan - 12/22/16 1338    Clinical Impression Statement Pt is progressing slowly towards goals. She remains limited by cognitive deficits and decreased overall endurance. Pt can benefit from continued skilled occupational tehrapy to work towards unmet goals and to reinforce strategies.   Occupational performance deficits (Please refer to evaluation for details): IADL's;ADL's;Leisure;Social Participation   Rehab Potential Fair   Current Impairments/barriers affecting progress: cognitive deficits   OT Frequency 2x / week   OT Duration 4 weeks   OT Treatment/Interventions Self-care/ADL training;DME and/or AE instruction;Splinting;Patient/family education;Balance training;Therapeutic exercises;Ultrasound;Therapeutic exercise;Therapeutic activities;Cognitive remediation/compensation;Passive range of motion;Neuromuscular education;Cryotherapy;Electrical Stimulation;Parrafin;Energy conservation;Manual Therapy;Visual/perceptual remediation/compensation   Clinical Decision Making Several treatment options, min-mod task modification necessary   OT Home Exercise Plan bag exercises issued, basic coordination   Consulted and Agree with Plan of Care  Patient   Plan renew to complete pt/ caregiver education and to work towards unmet goals      Patient will benefit from skilled therapeutic intervention in order to improve the following deficits and impairments:  Abnormal gait, Decreased cognition, Impaired flexibility, Pain, Decreased mobility, Decreased coordination, Decreased activity tolerance, Decreased endurance, Decreased range of motion, Decreased strength, Impaired UE functional use, Difficulty walking, Decreased safety awareness, Decreased knowledge of precautions, Decreased balance  Visit Diagnosis: Other symptoms and signs involving the nervous system  Abnormal posture  Frontal lobe and executive function deficit  Other lack of coordination  Muscle weakness (generalized)    Problem List Patient Active Problem List   Diagnosis Date Noted  . CAP (community acquired pneumonia) 04/13/2016  . PNA (pneumonia) 04/13/2016  . Murmur 09/02/2015  . Parkinson disease ([place]) 09/02/2015  . Bilateral carotid bruits 09/02/2015  . Confusion 08/07/2015  . Acute encephalopathy 08/07/2015  . UTI (lower urinary tract infection) 08/07/2015  . Acute kidney failure ([place]) 08/07/2015  . Paralysis agitans ([place]) 09/19/2012  . Lymphoma ([place]) 07/03/2011  . Malignant neoplasm of female breast ([place]) 07/03/2011  . Abnormal CT of the chest 03/13/2011  . HYPERTENSION, BENIGN 01/01/2009  . EDEMA 01/01/2009    Sayde Lish 12/23/2016, 9:25 AM  [facility] [address] [place], [name], [address] Phone: [phone]   Fax:  [phone]  Name: [name] MRN: [id number] Date of Birth: [birth year]

## 2016-12-24 ENCOUNTER — Encounter: Payer: Medicare Other | Admitting: Occupational Therapy

## 2016-12-24 ENCOUNTER — Encounter: Payer: Medicare Other | Admitting: Speech Pathology

## 2016-12-25 ENCOUNTER — Ambulatory Visit: Payer: Medicare Other | Admitting: Occupational Therapy

## 2016-12-25 ENCOUNTER — Ambulatory Visit: Payer: Medicare Other | Attending: Neurology

## 2016-12-25 DIAGNOSIS — R471 Dysarthria and anarthria: Secondary | ICD-10-CM | POA: Diagnosis not present

## 2016-12-25 DIAGNOSIS — H908 Mixed conductive and sensorineural hearing loss, unspecified: Secondary | ICD-10-CM | POA: Diagnosis not present

## 2016-12-25 DIAGNOSIS — R41844 Frontal lobe and executive function deficit: Secondary | ICD-10-CM | POA: Insufficient documentation

## 2016-12-25 DIAGNOSIS — M6281 Muscle weakness (generalized): Secondary | ICD-10-CM | POA: Insufficient documentation

## 2016-12-25 DIAGNOSIS — R278 Other lack of coordination: Secondary | ICD-10-CM | POA: Diagnosis not present

## 2016-12-25 DIAGNOSIS — R293 Abnormal posture: Secondary | ICD-10-CM

## 2016-12-25 DIAGNOSIS — R29818 Other symptoms and signs involving the nervous system: Secondary | ICD-10-CM | POA: Diagnosis not present

## 2016-12-25 DIAGNOSIS — J069 Acute upper respiratory infection, unspecified: Secondary | ICD-10-CM | POA: Diagnosis not present

## 2016-12-25 DIAGNOSIS — R41841 Cognitive communication deficit: Secondary | ICD-10-CM | POA: Diagnosis not present

## 2016-12-25 DIAGNOSIS — H95191 Other disorders following mastoidectomy, right ear: Secondary | ICD-10-CM | POA: Diagnosis not present

## 2016-12-25 NOTE — Therapy (Signed)
[facility] [address] [place], [name], [address] Phone: [phone]   Fax:  [phone]  Speech Language Pathology Treatment  Patient Details  Name: [name] MRN: [id number] Date of Birth: November 10, 1927 Referring Provider: Alonza Bogus, D.O.  Encounter Date: 12/25/2016      End of Session - 12/25/16 1702    Visit Number 12   Number of Visits 17   Date for SLP Re-Evaluation 12/25/16  will need renew/dc 12-30-16   SLP Start Time 1532   SLP Stop Time  6720   SLP Time Calculation (min) 43 min      Past Medical History:  Diagnosis Date  . Anxiety   . Asthma   . Depression   . Diabetes mellitus without complication ([place])    9/47/09.Marland KitchenMarland Kitchenpt denies  . GERD (gastroesophageal reflux disease)   . Hearing loss   . NHL (non-Hodgkin's lymphoma) ([place])    nhl dx 9/04 breast ca dx1/12  . Parkinson disease [facility])     Past Surgical History:  Procedure Laterality Date  . Ba-HA Ear implant    . MASTECTOMY  2 /8/ 12   bilateral  . MASTECTOMY      There were no vitals filed for this visit.      Subjective Assessment - 12/25/16 1544    Subjective Husband questions about nonverbal cues for loudness for pt. Pt has cochlear implant activated today for first time this therapy course.   Patient is accompained by: --  husband   Currently in Pain? No/denies               ADULT SLP TREATMENT - 12/25/16 1552      General Information   Behavior/Cognition Alert;Cooperative;Pleasant mood     Treatment Provided   Treatment provided Cognitive-Linquistic     Cognitive-Linquistic Treatment   Treatment focused on Dysarthria   Skilled Treatment Discussion with husband and pt/SLP about nonverbal cue for loudness and hand cupped over ear was agreed upon. SLP told pt/husband that they must practice this at home due to pt's decr'd memory at this time. SLP stressed procedural memory was best means for memory at this time  for pt meaning repeated daily procedural tasks such as loud /a/. Pt inquired how many reps she must do with loud /a/ each day. Pt average with loud /a/ was low 80s-mid 80s with consistent cues for loudness. In activities of min cognitive load (dual meaning words) pt req'd consistent visual, verbal and auditory cues for loudness, in order to achieve WNL loudness 40% of the time. SLP took this oppotunity to tell husband that pt will likely require reminders for loudness especially when pt is thinking deeply on something. SLP also educated husband on compensations for improving communication (sitting directly across from speaker, muting TV, etc).       Assessment / Recommendations / Plan   Plan --  d/c next session     Progression Toward Goals   Progression toward goals Not progressing toward goals (comment)          SLP Education - 12/25/16 1702    Education provided Yes   Education Details compensations for effective communicaiton, nonverbal cue for loudness   Person(s) Educated Patient;Spouse   Methods Explanation;Demonstration;Verbal cues;Handout   Comprehension Verbalized understanding;Returned demonstration;Verbal cues required;Need further instruction          SLP Short Term Goals - 12/22/16 1312      SLP SHORT TERM GOAL #1   Title Pt will demonstrate  sustained /a/ of 85dB with rare min A over 3 sessions   Status Achieved     SLP SHORT TERM GOAL #2   Title Pt will maintain average of 70dB during structured speech tasks with occassional min A over 3 sessions    Status Not Met     SLP SHORT TERM GOAL #3   Status Achieved          SLP Long Term Goals - 12/25/16 1704      SLP LONG TERM GOAL #1   Title Maintain an average of 70dB during conversation over 8 minutes with rare min A   Time 3   Period Weeks   Status On-going     SLP LONG TERM GOAL #2   Title Converse audilbly in a noisy environment for 8 minutes with rare min A    Time 3   Period Weeks   Status  On-going     SLP LONG TERM GOAL #3   Title pt will report husband asking less for her to repeat, than before ST   Time 3   Period Weeks   Status On-going          Plan - 12/25/16 1703    Clinical Impression Statement Pt continues to demonstrate reduced volume and intelligibility in conversation and structured tasks at phrase/sentence level with simple and simple-mod complex cognitive load, requirnig consistent cues for loudness. Continue skilled ST to maximize volume in conversation for improved intelligibility for one more session.   Speech Therapy Frequency 2x / week   Duration --  8 weeks   Treatment/Interventions Compensatory strategies;Patient/family education;Functional tasks;Cueing hierarchy;SLP instruction and feedback;Internal/external aids;Cognitive reorganization   Potential to Achieve Goals Fair   Potential Considerations Previous level of function;Severity of impairments   Consulted and Agree with Plan of Care Patient      Patient will benefit from skilled therapeutic intervention in order to improve the following deficits and impairments:   Dysarthria and anarthria  Cognitive communication deficit    Problem List Patient Active Problem List   Diagnosis Date Noted  . CAP (community acquired pneumonia) 04/13/2016  . PNA (pneumonia) 04/13/2016  . Murmur 09/02/2015  . Parkinson disease ([place]) 09/02/2015  . Bilateral carotid bruits 09/02/2015  . Confusion 08/07/2015  . Acute encephalopathy 08/07/2015  . UTI (lower urinary tract infection) 08/07/2015  . Acute kidney failure ([place]) 08/07/2015  . Paralysis agitans ([place]) 09/19/2012  . Lymphoma ([place]) 07/03/2011  . Malignant neoplasm of female breast ([place]) 07/03/2011  . Abnormal CT of the chest 03/13/2011  . HYPERTENSION, BENIGN 01/01/2009  . EDEMA 01/01/2009    [facility] ,[place], CCC-SLP  12/25/2016, 5:04 PM  [facility] [address] [place],  [name], [address] Phone: [phone]   Fax:  [phone]   Name: [name] MRN: [id number] Date of Birth: [birth year]

## 2016-12-25 NOTE — Patient Instructions (Signed)
Memory Compensation Strategies  1. Use "WARM" strategy.  W= write it down  A= associate it  R= repeat it  M= make a mental note  2.   You can keep a Memory Notebook.  Use a 3-ring notebook with sections for the following: calendar, important names and phone numbers,  medications, doctors' names/phone numbers, lists/reminders, and a section to journal what you did  each day.   3.    Use a calendar to write appointments down.  4.    Write yourself a schedule for the day.  This can be placed on the calendar or in a separate section of the Memory Notebook.  Keeping a  regular schedule can help memory.  5.    Use medication organizer with sections for each day or morning/evening pills.  You may need help loading it  6.    Keep a basket, or pegboard by the door.  Place items that you need to take out with you in the basket or on the pegboard.  You may also want to  include a message board for reminders.  7.    Use sticky notes.  Place sticky notes with reminders in a place where the task is performed.  For example: " turn off the  stove" placed by the stove, "lock the door" placed on the door at eye level, " take your medications" on  the bathroom mirror or by the place where you normally take your medications.  8.    Use alarms/timers.  Use while cooking to remind yourself to check on food or as a reminder to take your medicine, or as a  reminder to make a call, or as a reminder to perform another task, etc.     Keeping Thinking Skills Sharp: 1. Jigsaw puzzles 2. Card/board games 3. Talking on the phone/social events 4. Lumosity.com 5. Online games 6. Word serches/crossword puzzles 7.  Logic puzzles 8. Aerobic exercise (stationary bike) 9. Eating balanced diet (fruits & veggies) 10. Drink water 11. Try something new--new recipe, hobby 12. Crafts 13. Do a variety of activities that are challenging  

## 2016-12-25 NOTE — Patient Instructions (Signed)
Sit in front of Val and mute TV when you need to understand/hear her more. Cup your hand to your ear to indicate you need her to speak up. PRACTICE THIS.

## 2016-12-25 NOTE — Therapy (Signed)
[facility] [address] [place], [name], [address] Phone: [phone]   Fax:  [phone]  Occupational Therapy Treatment  Patient Details  Name: [name] MRN: [id number] Date of Birth: Feb 22, 1928 Referring Provider: Dr. [name]  Encounter Date: 12/25/2016      OT End of Session - 12/25/16 1544    Visit Number 15   Number of Visits 17   Date for OT Re-Evaluation 01/01/17   Authorization Type MCR, UHC secondary   Authorization - Visit Number 16   Authorization - Number of Visits 20   OT Start Time 1450   OT Stop Time 1532   OT Time Calculation (min) 42 min   Activity Tolerance Patient tolerated treatment well   Behavior During Therapy [facility] for tasks assessed/performed      Past Medical History:  Diagnosis Date  . Anxiety   . Asthma   . Depression   . Diabetes mellitus without complication ([place])    0/73/71.Marland KitchenMarland Kitchenpt denies  . GERD (gastroesophageal reflux disease)   . Hearing loss   . NHL (non-Hodgkin's lymphoma) ([place])    nhl dx 9/04 breast ca dx1/12  . Parkinson disease [facility])     Past Surgical History:  Procedure Laterality Date  . Ba-HA Ear implant    . MASTECTOMY  2 /8/ 12   bilateral  . MASTECTOMY      There were no vitals filed for this visit.      Subjective Assessment - 12/25/16 1448    Pertinent History Lumbar radiofrequency ablation (to treat lumbar facet joint pain)-last one 08/27/16 with relief of back pain; dementia due to Parkinson's; DVT (07/2016)   Patient Stated Goals improve independence with daily activities   Currently in Pain? Yes   Pain Score 3    Pain Location Back   Pain Orientation Lower   Pain Descriptors / Indicators Aching   Pain Type Chronic pain   Pain Onset More than a month ago   Pain Frequency Intermittent   Aggravating Factors  overdoing it   Pain Relieving Factors rest   Multiple Pain Sites No                              OT  Education - 12/25/16 1541    Education provided Yes   Education Details reviewed memory compensation strategies and keeping thinking skills sharp, discharge planning including recommendation pt generates a to do list each day  and writes it down   [employer]) Educated Patient   Methods Explanation;Demonstration   Comprehension Verbalized understanding;Returned demonstration          OT Short Term Goals - 12/22/16 1320      OT SHORT TERM GOAL #1   Title I with PD specific HEP .- 11/20/16   Time 4   Period Weeks   Status Achieved  needs min v.c for bag exercises, pt's husband is able to assist     OT [place] #2   Title Pt will demonstrate understanding of adapted strategies for ADLs/IADLs.   Time 4   Period Weeks   Status On-going  needs reinforcement     OT SHORT TERM GOAL #3   Title Pt will demonstrate ability to write a short paragraph with 100% legibility and only minimal decrease in letter size.   Time 4   Period Weeks   Status Achieved  11/16/16     OT SHORT TERM GOAL #4  Title Pt will demonstrate improved bilateral UE functional use as evidenced by increasing box/ blocks score by 3 blocks.   Time 4   Period Weeks   Status Achieved  RUE 49, LUE 50     OT SHORT TERM GOAL #5   Title Pt will increase right shoulder flexion to 125* with -15 elbow ext to retrieve a lightweight object.   Baseline sh flexion: 120, elbow ext -20   Time 4   Period Weeks   Status Achieved  125,-15           OT Long Term Goals - 12/25/16 1450      OT LONG TERM GOAL #1   Title Pt will demonstrate improved safety for ADLs as evidenced by increasing bilateral standing functional reach to 10 inches or greater.- due 12/20/16   Time 8   Period Weeks   Status On-going  12/07/16:  8" bilaterally     OT LONG TERM GOAL #2   Title Pt will improve R dominant hand coordination for ADLs as shown by improving 9-hole peg test score by at least 3sec.    Baseline R 31.41 secs, LUE 30.32  secs   Time 8   Period Weeks   Status Achieved  12/07/16:  28.97sec, met 12/07/16     OT LONG TERM GOAL #3   Title Pt will demonstrate improved ease with fastening buttons as evidenced by decreasing 3 button/ unbutton to 32 secs or less.   Baseline 36.72 secs   Time 8   Period Weeks   Status On-going  12/07/16:  36.75     OT LONG TERM GOAL #4   Title Pt will verbalize understanding of memory, visual compensation strategies for ADLs prn.   Time 8   Period Weeks   Status Achieved  12/03/16  met     OT LONG TERM GOAL #5   Title --------------------------------------------------------------------------------------------------------               Plan - 12/25/16 1545    Clinical Impression Statement Pt and husband verbalize understanding of memory compendations and ways to keep thinking skills sharp. Therapist has receommended that pt does not drive due to delayed processing speed and reaction time. Pt/ husband verblize understanding.   Rehab Potential Fair   Current Impairments/barriers affecting progress: cognitive deficits   OT Frequency 2x / week   OT Duration 4 weeks   OT Treatment/Interventions Self-care/ADL training;DME and/or AE instruction;Splinting;Patient/family education;Balance training;Therapeutic exercises;Ultrasound;Therapeutic exercise;Therapeutic activities;Cognitive remediation/compensation;Passive range of motion;Neuromuscular education;Cryotherapy;Electrical Stimulation;Parrafin;Energy conservation;Manual Therapy;Visual/perceptual remediation/compensation   Plan d/c next vist   Consulted and Agree with Plan of Care Patient      Patient will benefit from skilled therapeutic intervention in order to improve the following deficits and impairments:     Visit Diagnosis: Other symptoms and signs involving the nervous system  Abnormal posture  Frontal lobe and executive function deficit  Other lack of coordination  Muscle weakness  (generalized)    Problem List Patient Active Problem List   Diagnosis Date Noted  . CAP (community acquired pneumonia) 04/13/2016  . PNA (pneumonia) 04/13/2016  . Murmur 09/02/2015  . Parkinson disease ([place]) 09/02/2015  . Bilateral carotid bruits 09/02/2015  . Confusion 08/07/2015  . Acute encephalopathy 08/07/2015  . UTI (lower urinary tract infection) 08/07/2015  . Acute kidney failure ([place]) 08/07/2015  . Paralysis agitans ([place]) 09/19/2012  . Lymphoma ([place]) 07/03/2011  . Malignant neoplasm of female breast ([place]) 07/03/2011  . Abnormal CT of the chest 03/13/2011  .  HYPERTENSION, BENIGN 01/01/2009  . EDEMA 01/01/2009    Rashi Granier 12/25/2016, 3:48 PM Theone Murdoch, OTR/L [phone] Phone: [phone] 3:48 PM 12/25/16 [facility] [address] [place], [name], [address] Phone: [phone]   Fax:  [phone]  Name: Kirsi Hugh MRN: [id number] Date of Birth: 31-Jul-1927

## 2016-12-28 ENCOUNTER — Ambulatory Visit: Payer: Medicare Other | Admitting: Occupational Therapy

## 2016-12-28 ENCOUNTER — Ambulatory Visit: Payer: Medicare Other | Admitting: Speech Pathology

## 2016-12-30 ENCOUNTER — Ambulatory Visit: Payer: Medicare Other | Admitting: Occupational Therapy

## 2016-12-30 ENCOUNTER — Ambulatory Visit: Payer: Medicare Other

## 2016-12-30 DIAGNOSIS — R471 Dysarthria and anarthria: Secondary | ICD-10-CM | POA: Diagnosis not present

## 2016-12-30 DIAGNOSIS — R41841 Cognitive communication deficit: Secondary | ICD-10-CM

## 2016-12-30 DIAGNOSIS — R278 Other lack of coordination: Secondary | ICD-10-CM

## 2016-12-30 DIAGNOSIS — R29818 Other symptoms and signs involving the nervous system: Secondary | ICD-10-CM | POA: Diagnosis not present

## 2016-12-30 DIAGNOSIS — R293 Abnormal posture: Secondary | ICD-10-CM

## 2016-12-30 DIAGNOSIS — R41844 Frontal lobe and executive function deficit: Secondary | ICD-10-CM

## 2016-12-30 DIAGNOSIS — M6281 Muscle weakness (generalized): Secondary | ICD-10-CM

## 2016-12-30 NOTE — Therapy (Signed)
[facility] [address] [place], [name], [address] Phone: [phone]   Fax:  [phone]  Occupational Therapy Treatment  Patient Details  Name: [name] MRN: [id number] Date of Birth: 1927-07-31 Referring Provider: Dr. [name]  Encounter Date: 12/30/2016      OT End of Session - 12/30/16 1021    Visit Number 16   Number of Visits 17   Date for OT Re-Evaluation 01/01/17   Authorization Type MCR, UHC secondary   Authorization - Visit Number 17   Authorization - Number of Visits 20   OT Start Time 1018   OT Stop Time 1100   OT Time Calculation (min) 42 min   Activity Tolerance Patient tolerated treatment well   Behavior During Therapy [facility] for tasks assessed/performed      Past Medical History:  Diagnosis Date  . Anxiety   . Asthma   . Depression   . Diabetes mellitus without complication ([place])    8/88/91.Marland KitchenMarland Kitchenpt denies  . GERD (gastroesophageal reflux disease)   . Hearing loss   . NHL (non-Hodgkin's lymphoma) ([place])    nhl dx 9/04 breast ca dx1/12  . Parkinson disease [facility])     Past Surgical History:  Procedure Laterality Date  . Ba-HA Ear implant    . MASTECTOMY  2 /8/ 12   bilateral  . MASTECTOMY      There were no vitals filed for this visit.      Subjective Assessment - 12/30/16 1020    Subjective  Pt reports she had a good weekend   Pertinent History Lumbar radiofrequency ablation (to treat lumbar facet joint pain)-last one 08/27/16 with relief of back pain; dementia due to Parkinson's; DVT (07/2016)   Patient Stated Goals improve independence with daily activities   Currently in Pain? No/denies        Therapist checked and reviewed progress towards goals. Reviewed bag exercises HEP and flipping playing cards with big movements, min-mod v.c Arm bike x 5 mins level 1 min v.c for intensity/ speed                        OT Short Term Goals - 12/30/16 1045      OT  SHORT TERM GOAL #1   Title I with PD specific HEP .- 11/20/16   Time 4   Period Weeks   Status Achieved  needs min v.c for bag exercises, pt's husband is able to assist     OT SHORT TERM GOAL #2   Title Pt will demonstrate understanding of adapted strategies for ADLs/IADLs.   Time 4   Period Weeks   Status Achieved  needs reinforcement     OT SHORT TERM GOAL #3   Title Pt will demonstrate ability to write a short paragraph with 100% legibility and only minimal decrease in letter size.   Time 4   Period Weeks   Status Achieved  11/16/16     OT SHORT TERM GOAL #4   Title Pt will demonstrate improved bilateral UE functional use as evidenced by increasing box/ blocks score by 3 blocks.   Time 4   Period Weeks   Status Achieved  RUE 49, LUE 50     OT SHORT TERM GOAL #5   Title Pt will increase right shoulder flexion to 125* with -15 elbow ext to retrieve a lightweight object.   Baseline sh flexion: 120, elbow ext -20   Time 4   Period Weeks  Status Achieved  125,-15           OT Long Term Goals - 01/04/2017 1041      OT LONG TERM GOAL #1   Title Pt will demonstrate improved safety for ADLs as evidenced by increasing bilateral standing functional reach to 10 inches or greater.- due 12/20/16   Time 8   Period Weeks   Status Achieved  RUE 10 inches, LUE 11 inches     OT LONG TERM GOAL #2   Title Pt will improve R dominant hand coordination for ADLs as shown by improving 9-hole peg test score by at least 3sec.    Baseline R 31.41 secs, LUE 30.32 secs   Time 8   Period Weeks   Status Achieved  12/07/16:  28.97sec, met 12/07/16     OT LONG TERM GOAL #3   Title Pt will demonstrate improved ease with fastening buttons as evidenced by decreasing 3 button/ unbutton to 32 secs or less.   Baseline 36.72 secs   Time 8   Period Weeks   Status Not Met  12/07/16:  49.34 secs     OT LONG TERM GOAL #4   Title Pt will verbalize understanding of memory, visual compensation  strategies for ADLs prn.   Time 8   Period Weeks   Status Achieved  12/03/16  met     OT LONG TERM GOAL #5   Title --------------------------------------------------------------------------------------------------------               Plan - 04-Jan-2017 1058    Clinical Impression Statement Pt and husband agree with plans for discharge.   Rehab Potential Fair   Current Impairments/barriers affecting progress: cognitive deficits   OT Frequency 2x / week   OT Duration 4 weeks   OT Treatment/Interventions Self-care/ADL training;DME and/or AE instruction;Splinting;Patient/family education;Balance training;Therapeutic exercises;Ultrasound;Therapeutic exercise;Therapeutic activities;Cognitive remediation/compensation;Passive range of motion;Neuromuscular education;Cryotherapy;Electrical Stimulation;Parrafin;Energy conservation;Manual Therapy;Visual/perceptual remediation/compensation   Plan d/c OT, pt to return for screen in 6 mons   Consulted and Agree with Plan of Care Patient;Family member/caregiver      Patient will benefit from skilled therapeutic intervention in order to improve the following deficits and impairments:  Abnormal gait, Decreased cognition, Impaired flexibility, Pain, Decreased mobility, Decreased coordination, Decreased activity tolerance, Decreased endurance, Decreased range of motion, Decreased strength, Impaired UE functional use, Difficulty walking, Decreased safety awareness, Decreased knowledge of precautions, Decreased balance  Visit Diagnosis: Other symptoms and signs involving the nervous system  Abnormal posture  Frontal lobe and executive function deficit  Other lack of coordination  Muscle weakness (generalized)      G-Codes - 2017/01/04 1721    Functional Assessment Tool Used (Outpatient only) 3 button/ unbutton: 49.34 secs bilateral standing functional reach: RUE 10 inches, LUE 11 inches   Functional Limitation Self care   Self Care Goal Status  ([id number]) At least 1 percent but less than 20 percent impaired, limited or restricted   Self Care Discharge Status [phone]) At least 20 percent but less than 40 percent impaired, limited or restricted    OCCUPATIONAL THERAPY DISCHARGE SUMMARY   Current functional level related to goals / functional outcomes: Pt demonstrates overall progress, yet she did not fully meet goals due to cognitive deficits.   Remaining deficits: Bradykinesia, cognitive deficits, decreased balance, decreased coordination, abnormal posture   Education / Equipment: Pt/ husband were educated in adapted strategies for ADLs, HEP, and ways to keep thinking skills sharp, pt/ husband verbalize understanding.  Plan: Patient agrees to discharge.  Patient goals  were not met. Patient is being discharged due to meeting the stated rehab goals.  ?????      Problem List Patient Active Problem List   Diagnosis Date Noted  . CAP (community acquired pneumonia) 04/13/2016  . PNA (pneumonia) 04/13/2016  . Murmur 09/02/2015  . Parkinson disease ([place]) 09/02/2015  . Bilateral carotid bruits 09/02/2015  . Confusion 08/07/2015  . Acute encephalopathy 08/07/2015  . UTI (lower urinary tract infection) 08/07/2015  . Acute kidney failure ([place]) 08/07/2015  . Paralysis agitans ([place]) 09/19/2012  . Lymphoma ([place]) 07/03/2011  . Malignant neoplasm of female breast ([place]) 07/03/2011  . Abnormal CT of the chest 03/13/2011  . HYPERTENSION, BENIGN 01/01/2009  . EDEMA 01/01/2009    RINE,KATHRYN 12/30/2016, 5:30 PM Theone Murdoch, OTR/L [phone] Phone: [phone] 5:33 PM 12/30/16 [facility] [address] [place], [name], [address] Phone: [phone]   Fax:  [phone]  Name: Altagracia Rone MRN: [id number] Date of Birth: 18-Dec-1927

## 2016-12-31 NOTE — Therapy (Signed)
[facility] [address] [place], [name], [address] Phone: [phone]   Fax:  [phone]  Speech Language Pathology Treatment  Patient Details  Name: [name] MRN: [id number] Date of Birth: 07-22-1928 Referring Provider: Alonza Bogus, D.O.  Encounter Date: 12/30/2016      End of Session - 12/31/16 1455    Visit Number 13   Number of Visits 17   Date for SLP Re-Evaluation 12/25/16   SLP Start Time 1104   SLP Stop Time  1145   SLP Time Calculation (min) 41 min   Activity Tolerance Patient tolerated treatment well      Past Medical History:  Diagnosis Date  . Anxiety   . Asthma   . Depression   . Diabetes mellitus without complication ([place])    9/47/65.Marland KitchenMarland Kitchenpt denies  . GERD (gastroesophageal reflux disease)   . Hearing loss   . NHL (non-Hodgkin's lymphoma) ([place])    nhl dx 9/04 breast ca dx1/12  . Parkinson disease [facility])     Past Surgical History:  Procedure Laterality Date  . Ba-HA Ear implant    . MASTECTOMY  2 /8/ 12   bilateral  . MASTECTOMY      There were no vitals filed for this visit.      Subjective Assessment - 12/30/16 1127    Subjective Pt unsure of non-verbal cue decided upon last session, to use louder voice. Req'd SLP visual cues.   Patient is accompained by: Family member  [name]   Currently in Pain? Yes   Pain Score 3    Pain Location Back   Pain Orientation Lower   Pain Descriptors / Indicators Aching   Pain Type Chronic pain   Pain Onset More than a month ago   Pain Frequency Intermittent   Aggravating Factors  movement "too much"   Pain Relieving Factors rest, meds               ADULT SLP TREATMENT - 12/31/16 0001      General Information   Behavior/Cognition Alert;Cooperative;Pleasant mood     Treatment Provided   Treatment provided Cognitive-Linquistic     Cognitive-Linquistic Treatment   Treatment focused on Dysarthria   Skilled Treatment  Discussion with husband and pt/SLP about need for additional cues (auditory with cochlear implant) for adequate loudness with loud /a/ from this session forward, as this is d/c day. SLP told pt/husband that pt may require this for practice with loud /a/ as an additional cue for adequate loudness for loud - was low 80s-mid 80s with consistent cues for loudness. In activities of min cognitive load (completing two-sentence "stories" with a pt-generated sentence), pt req'd consistent visual, verbal and auditory cues for consistency with her loudness, in order to achieve WNL loudness 50% of the time. SLP reminded pt/husband that pt will likely require reminders for loudness especially when pt is thinking deeply on something, and reminded pt again of nonverbal cue of cupped hand to ear.      Assessment / Recommendations / Plan   Plan Discharge SLP treatment due to (comment)  max potential reached at this time     Progression Toward Goals   Progression toward goals --  d/c day, see goals            SLP Short Term Goals - 12/22/16 1312      SLP SHORT TERM GOAL #1   Title Pt will demonstrate sustained /a/ of 85dB with rare min A over 3 sessions  Status Achieved     SLP SHORT TERM GOAL #2   Title Pt will maintain average of 70dB during structured speech tasks with occassional min A over 3 sessions    Status Not Met     SLP SHORT TERM GOAL #3   Status Achieved          SLP Long Term Goals - 12/31/16 1459      SLP LONG TERM GOAL #1   Title Maintain an average of 70dB during conversation over 8 minutes with rare min A   Status Not Met     SLP LONG TERM GOAL #2   Title Converse audilbly in a noisy environment for 8 minutes with rare min A    Status Not Met     SLP LONG TERM GOAL #3   Title pt will report husband asking less for her to repeat, than before ST   Status Not Met  husband reports pt requires same frequency to incr loudness as prior to [place] - 01-21-2017 1656     Clinical Impression Statement Pt continues to demonstrate reduced volume and intelligibility in conversation and structured tasks at phrase/sentence level at simple and simple-mod complex cognitive loads, requirnig consistent cues for loudness. At this time, pt has maxed current rehab potential and will be d/c'd. SLP reviewed nonverbal cue for loudness and the necessity that pt will require daily loud /a/ as well as daily practice with loudness.    Treatment/Interventions Compensatory strategies;Patient/family education;Functional tasks;Cueing hierarchy;SLP instruction and feedback;Internal/external aids;Cognitive reorganization   Potential to Achieve Goals Fair   Potential Considerations Previous level of function;Severity of impairments   Consulted and Agree with Plan of Care Patient      Patient will benefit from skilled therapeutic intervention in order to improve the following deficits and impairments:   Dysarthria and anarthria  Cognitive communication deficit      G-Codes - 01/21/17 1700    Functional Limitations Motor speech   Motor Speech Goal Status [phone]) At least 40 percent but less than 60 percent impaired, limited or restricted   Motor Speech Goal Status ([id number]) At least 40 percent but less than 60 percent impaired, limited or restricted     SPEECH THERAPY DISCHARGE SUMMARY  Visits from Start of Care: 13  Current functional level related to goals / functional outcomes: Pt met her short term goals, but did not meet any LTGs primarily due to lack of carryover to pt's non-therapy Environment (home and otherwise). Husband told SLP and pt on last day that frequency of request for repeat has not decr'd over therapy course. By 13 sessions SLP would expect some degree of functional carryover. SLP worked on a non-verbal cue for husband to use to encourage pt to incr her loudness in and out of the home. SLP explained/educated that procedural memory will be best means for pt to  habitualize new things at this time and encouraged husband to practice with previously provided speech and language tasks.   Remaining deficits: Dysarthria due to PD, cognitive linguistic deficits.   Education / Equipment: Communication strategies (set next to pt, face pt in conversation, go to room pt is in to talk, etc), loud /a/ completed daily, speech tasks for loudness completed daily, pt will need cues (verbal or nonverbal) to remain loud during the day.  Plan: Patient agrees to discharge.  Patient goals were partially met. Patient is being discharged due to                                                     ?????  meeting the current rehab potential.       Problem List Patient Active Problem List   Diagnosis Date Noted  . CAP (community acquired pneumonia) 04/13/2016  . PNA (pneumonia) 04/13/2016  . Murmur 09/02/2015  . Parkinson disease ([place]) 09/02/2015  . Bilateral carotid bruits 09/02/2015  . Confusion 08/07/2015  . Acute encephalopathy 08/07/2015  . UTI (lower urinary tract infection) 08/07/2015  . Acute kidney failure ([place]) 08/07/2015  . Paralysis agitans ([place]) 09/19/2012  . Lymphoma ([place]) 07/03/2011  . Malignant neoplasm of female breast ([place]) 07/03/2011  . Abnormal CT of the chest 03/13/2011  . HYPERTENSION, BENIGN 01/01/2009  . EDEMA 01/01/2009    [facility], [place]  12/31/2016, 3:00 PM  [facility] [address] [place], [name], [address] Phone: [phone]   Fax:  [phone]   Name: Loanne Emery MRN: [id number] Date of Birth: 1928/05/14

## 2016-12-31 NOTE — Patient Instructions (Signed)
[name] is going to use the hand cupped to his ear to help you remember to talk louder. DO 7-10 loud "ah" twice each day, and practice with the packet you have every day for at least 15-20 mintues.

## 2017-01-06 DIAGNOSIS — G20 Parkinson's disease: Secondary | ICD-10-CM | POA: Diagnosis not present

## 2017-01-06 DIAGNOSIS — Z8572 Personal history of non-Hodgkin lymphomas: Secondary | ICD-10-CM | POA: Diagnosis not present

## 2017-01-06 DIAGNOSIS — E7800 Pure hypercholesterolemia, unspecified: Secondary | ICD-10-CM | POA: Diagnosis not present

## 2017-01-06 DIAGNOSIS — Z853 Personal history of malignant neoplasm of breast: Secondary | ICD-10-CM | POA: Diagnosis not present

## 2017-01-06 DIAGNOSIS — N183 Chronic kidney disease, stage 3 (moderate): Secondary | ICD-10-CM | POA: Diagnosis not present

## 2017-01-06 DIAGNOSIS — F419 Anxiety disorder, unspecified: Secondary | ICD-10-CM | POA: Diagnosis not present

## 2017-01-18 ENCOUNTER — Other Ambulatory Visit: Payer: Self-pay | Admitting: Neurology

## 2017-01-18 MED ORDER — CARBIDOPA-LEVODOPA 25-100 MG PO TABS: ORAL_TABLET | ORAL | 1 refills | Status: DC

## 2017-01-18 MED ORDER — ENTACAPONE 200 MG PO TABS: 200.0000 mg | ORAL_TABLET | Freq: Three times a day (TID) | ORAL | 1 refills | Status: DC

## 2017-01-21 DIAGNOSIS — H01001 Unspecified blepharitis right upper eyelid: Secondary | ICD-10-CM | POA: Diagnosis not present

## 2017-01-21 DIAGNOSIS — H01002 Unspecified blepharitis right lower eyelid: Secondary | ICD-10-CM | POA: Diagnosis not present

## 2017-01-21 DIAGNOSIS — H524 Presbyopia: Secondary | ICD-10-CM | POA: Diagnosis not present

## 2017-01-21 DIAGNOSIS — Z961 Presence of intraocular lens: Secondary | ICD-10-CM | POA: Diagnosis not present

## 2017-02-03 ENCOUNTER — Inpatient Hospital Stay (HOSPITAL_COMMUNITY)
Admission: EM | Admit: 2017-02-03 | Discharge: 2017-02-07 | DRG: 378 | Disposition: A | Discharge status: Home / Self Care | Payer: Medicare Other | Attending: Internal Medicine | Admitting: Internal Medicine

## 2017-02-03 ENCOUNTER — Encounter (HOSPITAL_COMMUNITY): Payer: Self-pay

## 2017-02-03 DIAGNOSIS — J45909 Unspecified asthma, uncomplicated: Secondary | ICD-10-CM | POA: Diagnosis present

## 2017-02-03 DIAGNOSIS — I10 Essential (primary) hypertension: Secondary | ICD-10-CM | POA: Diagnosis not present

## 2017-02-03 DIAGNOSIS — C8590 Non-Hodgkin lymphoma, unspecified, unspecified site: Secondary | ICD-10-CM | POA: Diagnosis present

## 2017-02-03 DIAGNOSIS — K921 Melena: Secondary | ICD-10-CM | POA: Diagnosis not present

## 2017-02-03 DIAGNOSIS — Z853 Personal history of malignant neoplasm of breast: Secondary | ICD-10-CM

## 2017-02-03 DIAGNOSIS — K219 Gastro-esophageal reflux disease without esophagitis: Secondary | ICD-10-CM | POA: Diagnosis present

## 2017-02-03 DIAGNOSIS — G20 Parkinson's disease: Secondary | ICD-10-CM | POA: Diagnosis not present

## 2017-02-03 DIAGNOSIS — D696 Thrombocytopenia, unspecified: Secondary | ICD-10-CM | POA: Diagnosis present

## 2017-02-03 DIAGNOSIS — N179 Acute kidney failure, unspecified: Secondary | ICD-10-CM | POA: Diagnosis not present

## 2017-02-03 DIAGNOSIS — M1990 Unspecified osteoarthritis, unspecified site: Secondary | ICD-10-CM | POA: Diagnosis present

## 2017-02-03 DIAGNOSIS — D62 Acute posthemorrhagic anemia: Secondary | ICD-10-CM | POA: Diagnosis not present

## 2017-02-03 DIAGNOSIS — Z9013 Acquired absence of bilateral breasts and nipples: Secondary | ICD-10-CM

## 2017-02-03 DIAGNOSIS — R197 Diarrhea, unspecified: Secondary | ICD-10-CM | POA: Diagnosis not present

## 2017-02-03 DIAGNOSIS — Z9621 Cochlear implant status: Secondary | ICD-10-CM | POA: Diagnosis present

## 2017-02-03 DIAGNOSIS — K922 Gastrointestinal hemorrhage, unspecified: Secondary | ICD-10-CM | POA: Diagnosis not present

## 2017-02-03 DIAGNOSIS — F329 Major depressive disorder, single episode, unspecified: Secondary | ICD-10-CM | POA: Diagnosis present

## 2017-02-03 DIAGNOSIS — Z801 Family history of malignant neoplasm of trachea, bronchus and lung: Secondary | ICD-10-CM

## 2017-02-03 DIAGNOSIS — K449 Diaphragmatic hernia without obstruction or gangrene: Secondary | ICD-10-CM | POA: Diagnosis present

## 2017-02-03 DIAGNOSIS — Z79899 Other long term (current) drug therapy: Secondary | ICD-10-CM

## 2017-02-03 DIAGNOSIS — K625 Hemorrhage of anus and rectum: Secondary | ICD-10-CM | POA: Diagnosis not present

## 2017-02-03 DIAGNOSIS — E119 Type 2 diabetes mellitus without complications: Secondary | ICD-10-CM | POA: Diagnosis present

## 2017-02-03 DIAGNOSIS — Z7951 Long term (current) use of inhaled steroids: Secondary | ICD-10-CM

## 2017-02-03 LAB — COMPREHENSIVE METABOLIC PANEL
ALBUMIN: 3.5 g/dL (ref 3.5–5.0)
ALK PHOS: 66 U/L (ref 38–126)
ALT: 12 U/L — ABNORMAL LOW (ref 14–54)
AST: 32 U/L (ref 15–41)
Anion gap: 10 (ref 5–15)
BILIRUBIN TOTAL: 1.1 mg/dL (ref 0.3–1.2)
BUN: 37 mg/dL — AB (ref 6–20)
CALCIUM: 8.6 mg/dL — AB (ref 8.9–10.3)
CO2: 27 mmol/L (ref 22–32)
Chloride: 100 mmol/L — ABNORMAL LOW (ref 101–111)
Creatinine, Ser: 1.27 mg/dL — ABNORMAL HIGH (ref 0.44–1.00)
GFR calc Af Amer: 43 mL/min — ABNORMAL LOW (ref >60)
GFR calc non Af Amer: 37 mL/min — ABNORMAL LOW (ref >60)
GLUCOSE: 133 mg/dL — AB (ref 65–99)
Potassium: 4 mmol/L (ref 3.5–5.1)
Sodium: 137 mmol/L (ref 135–145)
TOTAL PROTEIN: 6 g/dL — AB (ref 6.5–8.1)

## 2017-02-03 LAB — CBC
HCT: 28.1 % — ABNORMAL LOW (ref 36.0–46.0)
HCT: 28.4 % — ABNORMAL LOW (ref 36.0–46.0)
HCT: 26.3 % — ABNORMAL LOW (ref 36.0–46.0)
HEMATOCRIT: 19.5 % — AB (ref 36.0–46.0)
HEMOGLOBIN: 6.6 g/dL — AB (ref 12.0–15.0)
Hemoglobin: 9.7 g/dL — ABNORMAL LOW (ref 12.0–15.0)
Hemoglobin: 9.7 g/dL — ABNORMAL LOW (ref 12.0–15.0)
Hemoglobin: 9 g/dL — ABNORMAL LOW (ref 12.0–15.0)
MCH: 29.2 pg (ref 26.0–34.0)
MCH: 28.3 pg (ref 26.0–34.0)
MCH: 29.3 pg (ref 26.0–34.0)
MCH: 29.2 pg (ref 26.0–34.0)
MCHC: 34.5 g/dL (ref 30.0–36.0)
MCHC: 34.2 g/dL (ref 30.0–36.0)
MCHC: 34.2 g/dL (ref 30.0–36.0)
MCHC: 33.8 g/dL (ref 30.0–36.0)
MCV: 84.6 fL (ref 78.0–100.0)
MCV: 82.8 fL (ref 78.0–100.0)
MCV: 85.7 fL (ref 78.0–100.0)
MCV: 86.3 fL (ref 78.0–100.0)
PLATELETS: 137 10*3/uL — AB (ref 150–400)
PLATELETS: 132 10*3/uL — AB (ref 150–400)
Platelets: 139 10*3/uL — ABNORMAL LOW (ref 150–400)
Platelets: 135 10*3/uL — ABNORMAL LOW (ref 150–400)
RBC: 3.32 MIL/uL — ABNORMAL LOW (ref 3.87–5.11)
RBC: 3.43 MIL/uL — ABNORMAL LOW (ref 3.87–5.11)
RBC: 3.07 MIL/uL — AB (ref 3.87–5.11)
RBC: 2.26 MIL/uL — AB (ref 3.87–5.11)
RDW: 15.5 % (ref 11.5–15.5)
RDW: 15.5 % (ref 11.5–15.5)
RDW: 15.7 % — AB (ref 11.5–15.5)
RDW: 15.7 % — ABNORMAL HIGH (ref 11.5–15.5)
WBC: 4.1 10*3/uL (ref 4.0–10.5)
WBC: 4.4 10*3/uL (ref 4.0–10.5)
WBC: 3.4 10*3/uL — AB (ref 4.0–10.5)
WBC: 4.3 10*3/uL (ref 4.0–10.5)

## 2017-02-03 LAB — CBC WITH DIFFERENTIAL/PLATELET
Basophils Absolute: 0 10*3/uL (ref 0.0–0.1)
Basophils Relative: 1 %
Eosinophils Absolute: 0.1 10*3/uL (ref 0.0–0.7)
Eosinophils Relative: 2 %
HCT: 23.3 % — ABNORMAL LOW (ref 36.0–46.0)
HEMOGLOBIN: 8 g/dL — AB (ref 12.0–15.0)
LYMPHS ABS: 1.3 10*3/uL (ref 0.7–4.0)
LYMPHS PCT: 36 %
MCH: 29.3 pg (ref 26.0–34.0)
MCHC: 34.3 g/dL (ref 30.0–36.0)
MCV: 85.3 fL (ref 78.0–100.0)
Monocytes Absolute: 0.4 10*3/uL (ref 0.1–1.0)
Monocytes Relative: 10 %
NEUTROS PCT: 51 %
Neutro Abs: 1.9 10*3/uL (ref 1.7–7.7)
PLATELETS: 127 10*3/uL — AB (ref 150–400)
RBC: 2.73 MIL/uL — AB (ref 3.87–5.11)
RDW: 15.6 % — AB (ref 11.5–15.5)
WBC: 3.7 10*3/uL — AB (ref 4.0–10.5)

## 2017-02-03 LAB — PROTIME-INR
INR: 1.12
PROTHROMBIN TIME: 14.5 s (ref 11.4–15.2)

## 2017-02-03 LAB — PREPARE RBC (CROSSMATCH)

## 2017-02-03 LAB — POC OCCULT BLOOD, ED: Fecal Occult Bld: POSITIVE — AB

## 2017-02-03 LAB — APTT: APTT: 33 s (ref 24–36)

## 2017-02-03 LAB — MRSA PCR SCREENING: MRSA BY PCR: NEGATIVE

## 2017-02-03 MED ORDER — SODIUM CHLORIDE 0.9 % IV SOLN
Freq: Once | INTRAVENOUS | Status: AC
  Administered 2017-02-03: via INTRAVENOUS

## 2017-02-03 MED ORDER — CALCIUM CARBONATE-VITAMIN D 500-200 MG-UNIT PO TABS
1.0000 | ORAL_TABLET | Freq: Every day | ORAL | Status: DC
  Administered 2017-02-03 – 2017-02-06 (×4): 1 via ORAL
  Filled 2017-02-03 (×4): qty 1

## 2017-02-03 MED ORDER — SODIUM CHLORIDE 0.9% FLUSH
3.0000 mL | Freq: Two times a day (BID) | INTRAVENOUS | Status: DC
  Administered 2017-02-03 – 2017-02-06 (×6): 3 mL via INTRAVENOUS

## 2017-02-03 MED ORDER — SERTRALINE HCL 25 MG PO TABS
25.0000 mg | ORAL_TABLET | Freq: Every day | ORAL | Status: DC
  Administered 2017-02-03 – 2017-02-06 (×4): 25 mg via ORAL
  Filled 2017-02-03 (×4): qty 1

## 2017-02-03 MED ORDER — BISACODYL 5 MG PO TBEC: 5.0000 mg | DELAYED_RELEASE_TABLET | Freq: Every day | ORAL | Status: DC | PRN

## 2017-02-03 MED ORDER — ACETAMINOPHEN 325 MG PO TABS: 650.0000 mg | ORAL_TABLET | Freq: Four times a day (QID) | ORAL | Status: DC | PRN

## 2017-02-03 MED ORDER — ENTACAPONE 200 MG PO TABS
200.0000 mg | ORAL_TABLET | Freq: Three times a day (TID) | ORAL | Status: DC
  Filled 2017-02-03 (×3): qty 1

## 2017-02-03 MED ORDER — SODIUM CHLORIDE 0.9 % IV SOLN
Freq: Once | INTRAVENOUS | Status: AC
  Administered 2017-02-03: 21:00:00 via INTRAVENOUS

## 2017-02-03 MED ORDER — SODIUM CHLORIDE 0.9 % IV BOLUS (SEPSIS)
1000.0000 mL | Freq: Once | INTRAVENOUS | Status: AC
  Administered 2017-02-03: 1000 mL via INTRAVENOUS

## 2017-02-03 MED ORDER — HYDRALAZINE HCL 20 MG/ML IJ SOLN
5.0000 mg | INTRAMUSCULAR | Status: DC | PRN
  Administered 2017-02-04 – 2017-02-05 (×2): 5 mg via INTRAVENOUS
  Filled 2017-02-03: qty 1
  Filled 2017-02-03: qty 0.25
  Filled 2017-02-03: qty 1

## 2017-02-03 MED ORDER — CALCIUM CARBONATE-VITAMIN D3 600-400 MG-UNIT PO TABS: 1.0000 | ORAL_TABLET | Freq: Every day | ORAL | Status: DC

## 2017-02-03 MED ORDER — ACYCLOVIR 400 MG PO TABS
400.0000 mg | ORAL_TABLET | Freq: Two times a day (BID) | ORAL | Status: DC
  Administered 2017-02-03 – 2017-02-07 (×9): 400 mg via ORAL
  Filled 2017-02-03 (×9): qty 1

## 2017-02-03 MED ORDER — CARBIDOPA-LEVODOPA 25-100 MG PO TABS
1.0000 | ORAL_TABLET | Freq: Two times a day (BID) | ORAL | Status: DC
  Administered 2017-02-03 – 2017-02-07 (×8): 1 via ORAL
  Filled 2017-02-03 (×8): qty 1

## 2017-02-03 MED ORDER — ONDANSETRON HCL 4 MG/2ML IJ SOLN
4.0000 mg | Freq: Three times a day (TID) | INTRAMUSCULAR | Status: DC | PRN
Start: 2017-02-03 — End: 2017-02-07
  Administered 2017-02-06: 4 mg via INTRAVENOUS
  Filled 2017-02-03 (×2): qty 2

## 2017-02-03 MED ORDER — CARBIDOPA-LEVODOPA 25-100 MG PO TABS
2.0000 | ORAL_TABLET | Freq: Every day | ORAL | Status: DC
  Administered 2017-02-03 – 2017-02-06 (×4): 2 via ORAL
  Filled 2017-02-03 (×4): qty 2

## 2017-02-03 MED ORDER — PANTOPRAZOLE SODIUM 40 MG IV SOLR
40.0000 mg | Freq: Two times a day (BID) | INTRAVENOUS | Status: DC
  Administered 2017-02-03 – 2017-02-04 (×5): 40 mg via INTRAVENOUS
  Filled 2017-02-03 (×5): qty 40

## 2017-02-03 MED ORDER — LORATADINE 10 MG PO TABS
10.0000 mg | ORAL_TABLET | Freq: Every day | ORAL | Status: DC
  Administered 2017-02-04 – 2017-02-07 (×4): 10 mg via ORAL
  Filled 2017-02-03 (×4): qty 1

## 2017-02-03 MED ORDER — MIRABEGRON ER 25 MG PO TB24
50.0000 mg | ORAL_TABLET | Freq: Every day | ORAL | Status: DC
  Administered 2017-02-03 – 2017-02-07 (×5): 50 mg via ORAL
  Filled 2017-02-03 (×6): qty 2

## 2017-02-03 MED ORDER — MOMETASONE FURO-FORMOTEROL FUM 200-5 MCG/ACT IN AERO
2.0000 | INHALATION_SPRAY | Freq: Two times a day (BID) | RESPIRATORY_TRACT | Status: DC
  Administered 2017-02-04 – 2017-02-07 (×7): 2 via RESPIRATORY_TRACT
  Filled 2017-02-03: qty 8.8

## 2017-02-03 MED ORDER — ENTACAPONE 200 MG PO TABS
200.0000 mg | ORAL_TABLET | Freq: Three times a day (TID) | ORAL | Status: DC
  Administered 2017-02-03 – 2017-02-07 (×12): 200 mg via ORAL
  Filled 2017-02-03 (×13): qty 1

## 2017-02-03 MED ORDER — SODIUM CHLORIDE 0.9 % IV SOLN
INTRAVENOUS | Status: DC
  Administered 2017-02-03 – 2017-02-05 (×4): via INTRAVENOUS

## 2017-02-03 MED ORDER — VITAMIN D3 25 MCG (1000 UNIT) PO TABS
1000.0000 [IU] | ORAL_TABLET | Freq: Every evening | ORAL | Status: DC
  Administered 2017-02-03 – 2017-02-06 (×4): 1000 [IU] via ORAL
  Filled 2017-02-03 (×4): qty 1

## 2017-02-03 MED ORDER — ADULT MULTIVITAMIN W/MINERALS CH
1.0000 | ORAL_TABLET | Freq: Every day | ORAL | Status: DC
  Administered 2017-02-05 – 2017-02-07 (×3): 1 via ORAL
  Filled 2017-02-03 (×3): qty 1

## 2017-02-03 NOTE — ED Triage Notes (Signed)
BIB EMS, pt reports bloody stools x2 days. Pt denies pain w/ BMs. Pt A+OX4, speaking in complete sentences.  EMS Vitals BP 145/70 P 86 RR 16 SPO2 97%

## 2017-02-03 NOTE — Progress Notes (Signed)
CRITICAL VALUE ALERT  Critical Value:  Hgb 6.6  Date & Time Notied:  02/03/2017  Provider Notified: Tylene Fantasia, NP  Orders Received/Actions taken: Blood that had been previously ordered was hung as soon as it was available. 2 more units of PRBCs were ordered.

## 2017-02-03 NOTE — Progress Notes (Signed)
Patient received to room 1440 from ED. Report obtained from ED RN. VSS; Telemetry placed. Husband at bedside. Oriented patient and husband to room, telephone, and call bell. Bed alarm set and patient instructed to call for assistance to get OOB. Patient and husband verbalize understanding. Call bell and telephone in reach of patient.  Lind Guest, RN

## 2017-02-03 NOTE — Progress Notes (Signed)
Called to pt's room by NT Meghan. Patient sitting on BSC, stating she's feeling "woozy". Appears pale. RR 24, SPO2 100% RA, B/P 88/41 P90. Large amount blood noted in [facility] with blood and clots noted on floor. RR called and to bedside to assess patient. B/P 113/58 P82. Dr Doyle Askew text paged and new verbal orders obtained. NS bolus begun per orders. Husband at bedside. Emotional support given to patient and husband. Patient transferred to ICU per orders.

## 2017-02-03 NOTE — H&P (Signed)
History and Physical    [name] VVZ:[id number] DOB: 04-Jan-1928 DOA: 02/03/2017  Referring MD/NP/PA: Dr. Blaine Hamper  PCP: [name], MD   Patient coming from: home  Chief Complaint: blood in stool   HPI: [name] is a 83 y.o. female with medical history significant of HTN, Parkinson's disease, NHL, presented to ED with main concern of sudden onset of blood in stool, maroon colored. Patient denies similar events in the past, she denies any pain, no chest pain and no abd pain, no dyspnea. Pt also reports taking Mobic daily but no other NSAID's. Pt denies fevers, chills, she not on any anticoagulation.   ED Course: Ptis hemodynamically stable, VSS, blood work notable for Hg drom from 10.6 --> 9.7, FOBT +. TRH asked to admit for further evaluation.   Review of Systems:  Constitutional: Negative for fever, chills, diaphoresis, activity change, appetite change and fatigue.  HENT: Negative for ear pain, nosebleeds, congestion, facial swelling, rhinorrhea, neck pain, neck stiffness and ear discharge.   Eyes: Negative for pain, discharge, redness, itching and visual disturbance.  Respiratory: Negative for cough, choking, chest tightness, shortness of breath, wheezing and stridor.   Cardiovascular: Negative for chest pain, palpitations and leg swelling.  Gastrointestinal: Negative for abdominal distention.  Genitourinary: Negative for dysuria, urgency, frequency, hematuria, flank pain, decreased urine volume, difficulty urinating and dyspareunia.  Musculoskeletal: Negative for back pain, joint swelling, arthralgias and gait problem.  Neurological: Negative for dizziness, tremors, seizures, syncope, facial asymmetry, speech difficulty, weakness, light-headedness, numbness and headaches.  Hematological: Negative for adenopathy. Does not bruise/bleed easily.  Psychiatric/Behavioral: Negative for hallucinations, behavioral problems, confusion, dysphoric mood, decreased  concentration and agitation.   Past Medical History:  Diagnosis Date  . Anxiety   . Asthma   . Depression   . Diabetes mellitus without complication ([place])    5/44/92.Marland KitchenMarland Kitchenpt denies  . GERD (gastroesophageal reflux disease)   . Hearing loss   . NHL (non-Hodgkin's lymphoma) ([place])    nhl dx 9/04 breast ca dx1/12  . Parkinson disease [facility])     Past Surgical History:  Procedure Laterality Date  . Ba-HA Ear implant    . MASTECTOMY  2 /8/ 12   bilateral  . MASTECTOMY     Social Hx:  reports that she has never smoked. She has never used smokeless tobacco. She reports that she does not drink alcohol or use drugs.  No Known Allergies  Family History  Problem Relation Age of Onset  . Lung cancer Father   . Cancer Father        lung  . Cancer Mother     Medication Sig  acyclovir (ZOVIRAX) 400 MG tablet Take 400 mg by mouth 2 (two) times daily.  bisacodyl (DULCOLAX) 5 MG EC tablet Take 5 mg by mouth daily as needed for moderate constipation.  Calcium Carbonate-Vitamin D3 (CALCIUM 600-D) 600-400 MG-UNIT TABS Take 1 tablet by mouth at bedtime.  carbidopa-levodopa (SINEMET IR) 25-100 MG tablet 1 tablet in the morning by mouth, 1 in the afternoon by mouth, 2 in the evening by mouth. Patient taking differently: Take 1-2 tablets by mouth 3 (three) times daily. Pt takes one tablet in the morning, one in the afternoon, and two in the evening.  cetirizine (ZYRTEC) 10 MG tablet Take 10 mg by mouth daily at 12 noon.   cholecalciferol (VITAMIN D) 1000 units tablet Take 1,000 Units by mouth every evening.   entacapone (COMTAN) 200 MG tablet Take 1 tablet (200 mg total) by mouth 3 (  three) times daily. TAKE 1 TABLET (200 MG TOTAL) BY MOUTH 3 (THREE) TIMES DAILY - take with Sinemet Patient taking differently: Take 200 mg by mouth 3 (three) times daily.   Fluticasone-Salmeterol (ADVAIR) 250-50 MCG/DOSE AEPB Inhale 1 puff into the lungs 2 (two) times daily.  meloxicam (MOBIC) 15 MG tablet Take 15 mg by  mouth daily.  mirabegron ER (MYRBETRIQ) 50 MG TB24 tablet Take 50 mg by mouth daily at 12 noon.   sertraline (ZOLOFT) 25 MG tablet Take 25 mg by mouth at bedtime.    Physical Exam: Vitals:   02/03/17 0143 02/03/17 0401 02/03/17 0653  BP: (!) 156/73 131/72 120/68  Pulse: 81 83 83  Resp: 16 20 (!) 25  Temp: 98 F (36.7 C)    TempSrc: Oral    SpO2: 98% 98% 96%    Constitutional: NAD, calm, comfortable Vitals:   02/03/17 0143 02/03/17 0401 02/03/17 0653  BP: (!) 156/73 131/72 120/68  Pulse: 81 83 83  Resp: 16 20 (!) 25  Temp: 98 F (36.7 C)    TempSrc: Oral    SpO2: 98% 98% 96%   Eyes: PERRL, lids and conjunctivae normal ENMT: Mucous membranes are moist. Posterior pharynx clear of any exudate or lesions.Normal dentition.  Neck: normal, supple, no masses, no thyromegaly Respiratory: clear to auscultation bilaterally, no wheezing, no crackles. Normal respiratory effort. No accessory muscle use.  Cardiovascular: Regular rate and rhythm, no rubs / gallops. No extremity edema. 2+ pedal pulses. No carotid bruits.  Abdomen: no tenderness, no masses palpated. No hepatosplenomegaly. Bowel sounds positive.  Musculoskeletal: no clubbing / cyanosis. No joint deformity upper and lower extremities. Good ROM, no contractures. Normal muscle tone.  Skin: no rashes, lesions, ulcers. No induration Neurologic: CN 2-12 grossly intact. Sensation intact, DTR normal. Strength 5/5 in all 4.  Psychiatric: Normal judgment and insight. Alert and oriented x 3. Normal mood.   Labs on Admission: I have personally reviewed following labs and imaging studies  CBC:  Recent Labs Lab 02/03/17 0216 02/03/17 0335  WBC 4.1 4.4  HGB 9.7* 9.7*  HCT 28.1* 28.4*  MCV 84.6 82.8  PLT 139* 301*   Basic Metabolic Panel:  Recent Labs Lab 02/03/17 0216  NA 137  K 4.0  CL 100*  CO2 27  GLUCOSE 133*  BUN 37*  CREATININE 1.27*  CALCIUM 8.6*   Liver Function Tests:  Recent Labs Lab 02/03/17 0216  AST  32  ALT 12*  ALKPHOS 66  BILITOT 1.1  PROT 6.0*  ALBUMIN 3.5   Coagulation Profile:  Recent Labs Lab 02/03/17 0335  INR 1.12   Urine analysis:    Component Value Date/Time   COLORURINE AMBER (A) 04/13/2016 1314   APPEARANCEUR CLEAR 04/13/2016 1314   LABSPEC 1.010 04/13/2016 1314   LABSPEC 1.015 06/05/2008 0907   PHURINE 7.0 04/13/2016 1314   GLUCOSEU NEGATIVE 04/13/2016 1314   HGBUR NEGATIVE 04/13/2016 1314   BILIRUBINUR NEGATIVE 04/13/2016 1314   BILIRUBINUR Negative 06/05/2008 0907   KETONESUR NEGATIVE 04/13/2016 1314   PROTEINUR NEGATIVE 04/13/2016 1314   UROBILINOGEN 0.2 09/20/2012 0117   NITRITE NEGATIVE 04/13/2016 1314   LEUKOCYTESUR NEGATIVE 04/13/2016 1314   LEUKOCYTESUR Negative 06/05/2008 0907   Radiological Exams on Admission: No results found.  EKG: pending   Assessment/Plan Active Problems:   GIB (gastrointestinal bleeding) - maroon colored stool, FOBT + - review of records indicated Hg at baseline in the past 6 months was 10-11 - now drop to 9.7 - agree with admission for  observation - will repeat CBC again today for follow up - allow full liquid diet - if Hg continues to drop, may need transfusion - also consider GI consult, pt follows with [facility]    Acute kidney injury - appears to be prerenal in etiology in the setting of acute blood loss anemia  - hold Mobic - give IVF - repeat BMP in AM    Thrombocytopenia - mild, possible related to acute problem - SCD's for DVT prophylaxis - CBC in AM    Parkinson's disease - continue home medications   DVT prophylaxis: SCD's Code Status: Full Family Communication: Pt updated at bedside Disposition Plan: will go home in 24 hours if Hg remains stable and rectal bleed resolved  Consults called: None Admission status: Observation   Faye Ramsay MD [facility] Pager [phone]  If 7PM-7AM, please contact night-coverage [email or web address] Password [facility]  02/03/2017, 7:16 AM

## 2017-02-03 NOTE — ED Notes (Signed)
Patient taken to floor by RN. Bedside report given

## 2017-02-03 NOTE — Progress Notes (Addendum)
Shift event: Pt admitted by [facility] earlier today with GIB. Apparently, had a few stools at home, then came to ED. Per RN, had a "couple" on day shift today. Day attending ordered one unit of PRBCs when Hgb dropped to 8 during the day. (Baseline Hgb on chart 10-11 and on admit was 9.7).  Tonight, RN paged NP with Hgb results of 6.6 around 9pm. At that time, RN was hanging the first unit of blood. However, RN informed this NP that pt had 4 bloody BMs since shift change. NP ordered 2 more units to follow this unit and blood bank to stay 2 U ahead. NP to bedside.  S: pt is not the best historian. She states she had bleeding at home which brought her to the ED. She had never had GI bleeding before. States she takes Aleve about 3-4 times per week and her MAR shows Mobic as well. States she had a colonoscopy "years ago". Denies hx of polyps or diverticulosis or colon cancer. No hx CAD. + hx murmur. Follows with cardiology regularly. Denies abdominal pain, n/v, dizziness or chest pain.  O: Fairly well, although, chronically ill appearing elderly WF in NAD. She is alert. Answers questions appropriately. Slow to respond at times. BP 120s. HR 80s. RR 16. SaO2 normal on RA. Card: RRR. 3-4/6 SEM. Abd: BS + x 4 quadrants. Soft, NT. No guarding. Skin: pale, warm and dry. Neuro: grossly intact. No focal deficit noted.  A/P: 1. GIB-Likely secondary to NSAID use. Most stools tonight have been bright red with clots. Continue transfusions. Coags normal (PT 14.5, INR 1/12, PTT 33). Watch hemodynamics as she could decompensate quickly given her amount of bleeding tonight and her age. Serial CBCs. Holding Mobic of course. NP spoke to Dr. Amedeo Plenty of GI. He is coming in to see the pt. Continue Protonix IV q12, ? Need for drip. May need TAG study. Defer to GI. Keep NPO for now.  2. AKI-creat 1.27 (baseline .9-1.0). continue IVFs. Holding Mobic. BMP in am.  3. Murmur-pt of Dr. Modesto Charon OV on chart 2/18. Murmur secondary to aortic  stenosis. Grade I diastolic dysfunction with preserved EF on echo 3/18. Watch for overload with tranfusions and IVF.  4. Parkinson's-stable.  KJKG, NP [facility] Update: VSS. Pt has not had another BM since above until this am at 0545. 3 units PRBCs finished and lab to draw Hgb shortly. May need further tranfusion.  KJKG, NP [facility]  Total critical care time: 75 minutes Critical care time was exclusive of separately billable procedures and treating other patients. Critical care was necessary to treat or prevent imminent or life-threatening deterioration. Critical care was time spent personally by me on the following activities: development of treatment plan with patient and/or surrogate as well as nursing, discussions with consultants, evaluation of patient's response to treatment, examination of patient, obtaining history from patient or surrogate, ordering and performing treatments and interventions, ordering and review of laboratory studies, ordering and review of radiographic studies, pulse oximetry and re-evaluation of patient's condition.

## 2017-02-03 NOTE — ED Provider Notes (Signed)
[facility] DEPT Provider Note   CSN: [id number] Arrival date & time: 02/03/17  0140     History   Chief Complaint Chief Complaint  Patient presents with  . Rectal Bleeding    HPI [name] is a 83 y.o. female.  Patient presents emergency department with chief complaint of rectal bleeding. She states that she has had some bloody bowel movements since yesterday morning. She reports the bowel movements as bright red blood. She denies any abdominal pain. She denies any fevers, chills, chest pain, shortness of breath, lightheadedness, dizziness, nausea, or vomiting. She is not anticoagulated. She denies any other associated symptoms. There are no modifying factors.   The history is provided by the patient. No language interpreter was used.    Past Medical History:  Diagnosis Date  . Anxiety   . Asthma   . Depression   . Diabetes mellitus without complication ([place])    6/83/41.Marland KitchenMarland Kitchenpt denies  . GERD (gastroesophageal reflux disease)   . Hearing loss   . NHL (non-Hodgkin's lymphoma) ([place])    nhl dx 9/04 breast ca dx1/12  . Parkinson disease [facility])     Patient Active Problem List   Diagnosis Date Noted  . GIB (gastrointestinal bleeding) 02/03/2017  . CAP (community acquired pneumonia) 04/13/2016  . PNA (pneumonia) 04/13/2016  . Murmur 09/02/2015  . Parkinson disease ([place]) 09/02/2015  . Bilateral carotid bruits 09/02/2015  . Confusion 08/07/2015  . Acute encephalopathy 08/07/2015  . UTI (lower urinary tract infection) 08/07/2015  . Acute kidney failure ([place]) 08/07/2015  . Paralysis agitans ([place]) 09/19/2012  . Lymphoma ([place]) 07/03/2011  . Malignant neoplasm of female breast ([place]) 07/03/2011  . Abnormal CT of the chest 03/13/2011  . HYPERTENSION, BENIGN 01/01/2009  . EDEMA 01/01/2009    Past Surgical History:  Procedure Laterality Date  . Ba-HA Ear implant    . MASTECTOMY  2 /8/ 12   bilateral  . MASTECTOMY      OB History    No data available        Home Medications    Prior to Admission medications   Medication Sig Start Date End Date Taking? Authorizing Provider  acetaminophen (TYLENOL) 325 MG tablet Take 650 mg by mouth every 6 (six) hours as needed for mild pain, moderate pain, fever or headache.    Yes [provider]  acyclovir (ZOVIRAX) 400 MG tablet Take 400 mg by mouth 2 (two) times daily.   Yes [provider]  bisacodyl (DULCOLAX) 5 MG EC tablet Take 5 mg by mouth daily as needed for moderate constipation.   Yes [provider]  Calcium Carbonate-Vitamin D3 (CALCIUM 600-D) 600-400 MG-UNIT TABS Take 1 tablet by mouth at bedtime.   Yes [provider]  carbidopa-levodopa (SINEMET IR) 25-100 MG tablet 1 tablet in the morning by mouth, 1 in the afternoon by mouth, 2 in the evening by mouth. Patient taking differently: Take 1-2 tablets by mouth 3 (three) times daily. Pt takes one tablet in the morning, one in the afternoon, and two in the evening. 01/18/17  Yes Tat, Eustace Quail, DO  cetirizine (ZYRTEC) 10 MG tablet Take 10 mg by mouth daily at 12 noon.    Yes [provider]  cholecalciferol (VITAMIN D) 1000 units tablet Take 1,000 Units by mouth every evening.    Yes [provider]  entacapone (COMTAN) 200 MG tablet Take 1 tablet (200 mg total) by mouth 3 (three) times daily. TAKE 1 TABLET (200 MG TOTAL) BY MOUTH 3 (  THREE) TIMES DAILY - take with Sinemet Patient taking differently: Take 200 mg by mouth 3 (three) times daily.  01/18/17  Yes Tat, Eustace Quail, DO  Fluticasone-Salmeterol (ADVAIR) 250-50 MCG/DOSE AEPB Inhale 1 puff into the lungs 2 (two) times daily.   Yes [provider]  meloxicam (MOBIC) 15 MG tablet Take 15 mg by mouth daily.   Yes [provider]  mirabegron ER (MYRBETRIQ) 50 MG TB24 tablet Take 50 mg by mouth daily at 12 noon.    Yes [provider]  Multiple Vitamins-Minerals (MULTIVITAMIN WITH MINERALS) tablet Take 1 tablet by mouth  daily with breakfast.    Yes [provider]  sertraline (ZOLOFT) 25 MG tablet Take 25 mg by mouth at bedtime.   Yes [provider]    Family History Family History  Problem Relation Age of Onset  . Lung cancer Father   . Cancer Father        lung  . Cancer Mother     Social History Social History  Substance Use Topics  . Smoking status: Never Smoker  . Smokeless tobacco: Never Used     Comment: husband wsa smoker  . Alcohol use No     Comment: no     Allergies   Patient has no known allergies.   Review of Systems Review of Systems  All other systems reviewed and are negative.    Physical Exam Updated Vital Signs BP (!) 156/73 (BP Location: Left Arm)   Pulse 81   Temp 98 F (36.7 C) (Oral)   Resp 16   LMP  (LMP Unknown)   SpO2 98%   Physical Exam  Constitutional: She is oriented to person, place, and time. She appears well-developed and well-nourished.  HENT:  Head: Normocephalic and atraumatic.  Eyes: Conjunctivae and EOM are normal. Pupils are equal, round, and reactive to light.  Neck: Normal range of motion. Neck supple.  Cardiovascular: Normal rate and regular rhythm.  Exam reveals no gallop and no friction rub.   No murmur heard. Pulmonary/Chest: Effort normal and breath sounds normal. No respiratory distress. She has no wheezes. She has no rales. She exhibits no tenderness.  Abdominal: Soft. Bowel sounds are normal. She exhibits no distension and no mass. There is no tenderness. There is no rebound and no guarding.  Genitourinary:  Genitourinary Comments: DRE remarkable for maroon blood/stool, no active hemorrhage  Musculoskeletal: Normal range of motion. She exhibits no edema or tenderness.  Neurological: She is alert and oriented to person, place, and time.  Skin: Skin is warm and dry.  Psychiatric: She has a normal mood and affect. Her behavior is normal. Judgment and thought content normal.  Nursing note and vitals  reviewed.    ED Treatments / Results  Labs (all labs ordered are listed, but only abnormal results are displayed) Labs Reviewed  COMPREHENSIVE METABOLIC PANEL - Abnormal; Notable for the following:       Result Value   Chloride 100 (*)    Glucose, Bld 133 (*)    BUN 37 (*)    Creatinine, Ser 1.27 (*)    Calcium 8.6 (*)    Total Protein 6.0 (*)    ALT 12 (*)    GFR calc non Af Amer 37 (*)    GFR calc Af Amer 43 (*)    All other components within normal limits  CBC - Abnormal; Notable for the following:    RBC 3.32 (*)    Hemoglobin 9.7 (*)  HCT 28.1 (*)    Platelets 139 (*)    All other components within normal limits  POC OCCULT BLOOD, ED - Abnormal; Notable for the following:    Fecal Occult Bld POSITIVE (*)    All other components within normal limits  CBC  CBC  CBC  CBC  PROTIME-INR  APTT  POC OCCULT BLOOD, ED  TYPE AND SCREEN    EKG  EKG Interpretation None       Radiology No results found.  Procedures Procedures (including critical care time)  Medications Ordered in ED Medications  pantoprazole (PROTONIX) injection 40 mg (40 mg Intravenous Given 02/03/17 0339)  0.9 %  sodium chloride infusion ( Intravenous New Bag/Given 02/03/17 0339)  ondansetron (ZOFRAN) injection 4 mg (4 mg Intravenous Refused 02/03/17 0339)  hydrALAZINE (APRESOLINE) injection 5 mg (not administered)     Initial Impression / Assessment and Plan / ED Course  I have reviewed the triage vital signs and the nursing notes.  Pertinent labs & imaging results that were available during my care of the patient were reviewed by me and considered in my medical decision making (see chart for details).     Patient with rectal bleeding. Does not appear to be hemorrhaging. Vital signs are stable. She has not had any bloody bowel movements in the emergency department. Her hemoglobin is 9.7, down from 10.6. She will need admission for monitoring of hemoglobin.  Appreciate Dr. Blaine Hamper for  bringing patient into the hospital.  Final Clinical Impressions(s) / ED Diagnoses   Final diagnoses:  Rectal bleeding    New Prescriptions New Prescriptions   No medications on file     Delaine Lame 02/03/17 [place], April, MD 02/03/17 0502

## 2017-02-03 NOTE — ED Notes (Signed)
Bed: WA20 Expected date:  Expected time:  Means of arrival:  Comments: EMS 66F rectal bleed/diarrhea

## 2017-02-03 NOTE — Progress Notes (Signed)
Pt had 4 large bloody bowel movements since 1930. Tylene Fantasia the on call practitioner was notified and came to see the patient.

## 2017-02-03 NOTE — Significant Event (Addendum)
Rapid Response Event Note  Overview: Time Called: 4680 Arrival Time: 1818 Event Type: Hypotension Called By Operator to notify Rapid Response of a medical emergency at 1440. Upon arrival, patient was pale and slightly drowsy. Bedside RN told that BP was within 32Z systolic. Bedside RN also told that patient had large maroon to frank red bowel movement just before becoming hypotensive. Patient was symptomatic with becoming dizzy and lightheaded during having large bowel movement.  Initial Focused Assessment: Neuro: Patient alert and oriented x 4, pupils equal, intact, responsive to light, purposeful movements in all extremities Lungs: Clear and diminished in all lung fields upon auscultation, RR 15-16, not labored  Cardiac: S1 and S2 heart sounds present upon auscultation, pulses 1+ bilaterally in all extremities, Rhythm NSR, BP slightly soft (See vitals) Skin: Pale Interventions:  Bedside RN paged MD Doyle Askew in regards to patient's episode MD ordered to transfer to SDU and check HBG STAT Start Normal Saline Bolus: 1 Liter Plan of Care (if not transferred):     [name] C

## 2017-02-03 NOTE — Progress Notes (Addendum)
This is a no charge note  Pending admission per PA, Rob  83 year old lady with past medical history of asthma, GERD, depression, anxiety, Parkinson's disease, NHL, who presents with painless GI bleeding. No nausea, vomiting, abdominal pain. Patient is on Mobic. Hemoglobin dropped from 10.6-->9.7. Hemodynamically stable currently. FOBT positive. Patient is accepted to telemetry bed for obs. IV Protonix 40 mg twice a day started. Follow-up CBC every 6 hours.  Please call manager or [facility] at [phone] when pt arrives to floor   Ivor Costa, MD  [facility] Pager [phone]  If 7PM-7AM, please contact night-coverage [email or web address] Password [facility] 02/03/2017, 3:23 AM

## 2017-02-04 ENCOUNTER — Encounter (HOSPITAL_COMMUNITY)
Admission: EM | Disposition: A | Discharge status: Home / Self Care | Payer: Self-pay | Source: Home / Self Care | Attending: Internal Medicine

## 2017-02-04 ENCOUNTER — Observation Stay (HOSPITAL_COMMUNITY): Payer: Medicare Other | Admitting: Anesthesiology

## 2017-02-04 ENCOUNTER — Encounter (HOSPITAL_COMMUNITY): Payer: Self-pay | Admitting: *Deleted

## 2017-02-04 DIAGNOSIS — Z853 Personal history of malignant neoplasm of breast: Secondary | ICD-10-CM | POA: Diagnosis not present

## 2017-02-04 DIAGNOSIS — G20 Parkinson's disease: Secondary | ICD-10-CM | POA: Diagnosis not present

## 2017-02-04 DIAGNOSIS — N179 Acute kidney failure, unspecified: Secondary | ICD-10-CM | POA: Diagnosis present

## 2017-02-04 DIAGNOSIS — K5190 Ulcerative colitis, unspecified, without complications: Secondary | ICD-10-CM | POA: Diagnosis not present

## 2017-02-04 DIAGNOSIS — J45909 Unspecified asthma, uncomplicated: Secondary | ICD-10-CM | POA: Diagnosis present

## 2017-02-04 DIAGNOSIS — Z79899 Other long term (current) drug therapy: Secondary | ICD-10-CM | POA: Diagnosis not present

## 2017-02-04 DIAGNOSIS — D696 Thrombocytopenia, unspecified: Secondary | ICD-10-CM | POA: Diagnosis present

## 2017-02-04 DIAGNOSIS — Z9621 Cochlear implant status: Secondary | ICD-10-CM | POA: Diagnosis present

## 2017-02-04 DIAGNOSIS — D62 Acute posthemorrhagic anemia: Secondary | ICD-10-CM

## 2017-02-04 DIAGNOSIS — Z801 Family history of malignant neoplasm of trachea, bronchus and lung: Secondary | ICD-10-CM | POA: Diagnosis not present

## 2017-02-04 DIAGNOSIS — K449 Diaphragmatic hernia without obstruction or gangrene: Secondary | ICD-10-CM | POA: Diagnosis present

## 2017-02-04 DIAGNOSIS — E119 Type 2 diabetes mellitus without complications: Secondary | ICD-10-CM | POA: Diagnosis present

## 2017-02-04 DIAGNOSIS — I10 Essential (primary) hypertension: Secondary | ICD-10-CM

## 2017-02-04 DIAGNOSIS — K219 Gastro-esophageal reflux disease without esophagitis: Secondary | ICD-10-CM | POA: Diagnosis present

## 2017-02-04 DIAGNOSIS — F0281 Dementia in other diseases classified elsewhere with behavioral disturbance: Secondary | ICD-10-CM | POA: Diagnosis not present

## 2017-02-04 DIAGNOSIS — D649 Anemia, unspecified: Secondary | ICD-10-CM | POA: Diagnosis not present

## 2017-02-04 DIAGNOSIS — M1990 Unspecified osteoarthritis, unspecified site: Secondary | ICD-10-CM | POA: Diagnosis present

## 2017-02-04 DIAGNOSIS — K922 Gastrointestinal hemorrhage, unspecified: Principal | ICD-10-CM

## 2017-02-04 DIAGNOSIS — Z9013 Acquired absence of bilateral breasts and nipples: Secondary | ICD-10-CM | POA: Diagnosis not present

## 2017-02-04 DIAGNOSIS — F329 Major depressive disorder, single episode, unspecified: Secondary | ICD-10-CM | POA: Diagnosis not present

## 2017-02-04 DIAGNOSIS — D500 Iron deficiency anemia secondary to blood loss (chronic): Secondary | ICD-10-CM | POA: Diagnosis not present

## 2017-02-04 DIAGNOSIS — Z7951 Long term (current) use of inhaled steroids: Secondary | ICD-10-CM | POA: Diagnosis not present

## 2017-02-04 DIAGNOSIS — K625 Hemorrhage of anus and rectum: Secondary | ICD-10-CM | POA: Diagnosis not present

## 2017-02-04 DIAGNOSIS — C8590 Non-Hodgkin lymphoma, unspecified, unspecified site: Secondary | ICD-10-CM | POA: Diagnosis present

## 2017-02-04 DIAGNOSIS — K921 Melena: Secondary | ICD-10-CM | POA: Diagnosis not present

## 2017-02-04 HISTORY — PX: ESOPHAGOGASTRODUODENOSCOPY: SHX5428

## 2017-02-04 LAB — BASIC METABOLIC PANEL
ANION GAP: 6 (ref 5–15)
BUN: 26 mg/dL — ABNORMAL HIGH (ref 6–20)
CALCIUM: 6.7 mg/dL — AB (ref 8.9–10.3)
CO2: 22 mmol/L (ref 22–32)
CREATININE: 0.95 mg/dL (ref 0.44–1.00)
Chloride: 111 mmol/L (ref 101–111)
GFR, EST NON AFRICAN AMERICAN: 52 mL/min — AB (ref >60)
Glucose, Bld: 99 mg/dL (ref 65–99)
Potassium: 3.9 mmol/L (ref 3.5–5.1)
SODIUM: 139 mmol/L (ref 135–145)

## 2017-02-04 LAB — HEMOGLOBIN AND HEMATOCRIT, BLOOD
HCT: 24.9 % — ABNORMAL LOW (ref 36.0–46.0)
HEMATOCRIT: 23 % — AB (ref 36.0–46.0)
HEMOGLOBIN: 8.2 g/dL — AB (ref 12.0–15.0)
Hemoglobin: 8.8 g/dL — ABNORMAL LOW (ref 12.0–15.0)

## 2017-02-04 SURGERY — EGD (ESOPHAGOGASTRODUODENOSCOPY)
Anesthesia: Monitor Anesthesia Care

## 2017-02-04 MED ORDER — PROPOFOL 10 MG/ML IV BOLUS
INTRAVENOUS | Status: AC
  Filled 2017-02-04: qty 20

## 2017-02-04 MED ORDER — SODIUM CHLORIDE 0.9 % IV SOLN: INTRAVENOUS | Status: DC

## 2017-02-04 MED ORDER — PROPOFOL 10 MG/ML IV BOLUS
INTRAVENOUS | Status: DC | PRN
  Administered 2017-02-04 (×5): 10 mg via INTRAVENOUS

## 2017-02-04 MED ORDER — ONDANSETRON HCL 4 MG/2ML IJ SOLN: 4.0000 mg | Freq: Once | INTRAMUSCULAR | Status: AC | PRN

## 2017-02-04 NOTE — Consult Note (Signed)
[facility] CONSULT Reason for consult: G.I. bleeding Referring Physician: [facility] hospitalist. Primary care physician Dr. Samara Snide. Primary G.I.: Dr. Teena Irani  [name] is an 83 y.o. female.  HPI: she was admitted with an acute G.I. bleed. The patient has seen Dr. Amedeo Plenty in the past and had a colonoscopy 2002 with some small polyps removed and apparently declined a surveillance procedure. At the time of that colonoscopy she was noted to have some diverticular disease. She apparently takes Mobic for chronic DJD. This was verified her husband. She is not on any medication for acid. This is in spite of a history of Jerrye Bushy in her records. She denies any other NSAID use. She had the sudden onset of loose stools and describes dark stools initially and then BRB. She's had 4 bright blood bowel movements since admission. She did have some vague chest pain earlier in some epigastric pain seems to have resolved. She has received IV Protonix. Her initial hemoglobin was 9.7 drop to 6.6 but has risen to 8.8 most recently after 3 units of packed red cells. Patient denies any lower abdominal pain. She denies any constipation recent change with her bowel movements prior to said non sequitur the symptoms yesterday. Her medical history is remarkable for type II diabetes, depression, history of non-Hodgkin's lymphoma followed by Dr. Benay Spice which has been in remission and a history of bilateral mastectomies for breast cancer also followed by Dr Benay Spice. Recent workup is shown her cancers to be in remission. She also has a history of Parkinson's disease.  Past Medical History:  Diagnosis Date  . Anxiety   . Asthma   . Depression   . Diabetes mellitus without complication ([place])    5/63/89.Marland KitchenMarland Kitchenpt denies  . GERD (gastroesophageal reflux disease)   . Hearing loss   . NHL (non-Hodgkin's lymphoma) ([place])    nhl dx 9/04 breast ca dx1/12  . Parkinson disease [facility])     Past Surgical History:   Procedure Laterality Date  . Ba-HA Ear implant    . MASTECTOMY  2 /8/ 12   bilateral  . MASTECTOMY      Family History  Problem Relation Age of Onset  . Lung cancer Father   . Cancer Father        lung  . Cancer Mother     Social History:  reports that she has never smoked. She has never used smokeless tobacco. She reports that she does not drink alcohol or use drugs.  Allergies: No Known Allergies  Medications; Prior to Admission medications   Medication Sig Start Date End Date Taking? Authorizing Provider  acetaminophen (TYLENOL) 325 MG tablet Take 650 mg by mouth every 6 (six) hours as needed for mild pain, moderate pain, fever or headache.    Yes [provider]  acyclovir (ZOVIRAX) 400 MG tablet Take 400 mg by mouth 2 (two) times daily.   Yes [provider]  bisacodyl (DULCOLAX) 5 MG EC tablet Take 5 mg by mouth daily as needed for moderate constipation.   Yes [provider]  Calcium Carbonate-Vitamin D3 (CALCIUM 600-D) 600-400 MG-UNIT TABS Take 1 tablet by mouth at bedtime.   Yes [provider]  carbidopa-levodopa (SINEMET IR) 25-100 MG tablet 1 tablet in the morning by mouth, 1 in the afternoon by mouth, 2 in the evening by mouth. Patient taking differently: Take 1-2 tablets by mouth 3 (three) times daily. Pt takes one tablet in the morning, one in the afternoon, and two in the evening.  01/18/17  Yes Tat, Eustace Quail, DO  cetirizine (ZYRTEC) 10 MG tablet Take 10 mg by mouth daily at 12 noon.    Yes [provider]  cholecalciferol (VITAMIN D) 1000 units tablet Take 1,000 Units by mouth every evening.    Yes [provider]  entacapone (COMTAN) 200 MG tablet Take 1 tablet (200 mg total) by mouth 3 (three) times daily. TAKE 1 TABLET (200 MG TOTAL) BY MOUTH 3 (THREE) TIMES DAILY - take with Sinemet Patient taking differently: Take 200 mg by mouth 3 (three) times daily.  01/18/17  Yes Tat, Eustace Quail, DO  Fluticasone-Salmeterol  (ADVAIR) 250-50 MCG/DOSE AEPB Inhale 1 puff into the lungs 2 (two) times daily.   Yes [provider]  meloxicam (MOBIC) 15 MG tablet Take 15 mg by mouth daily.   Yes [provider]  mirabegron ER (MYRBETRIQ) 50 MG TB24 tablet Take 50 mg by mouth daily at 12 noon.    Yes [provider]  Multiple Vitamins-Minerals (MULTIVITAMIN WITH MINERALS) tablet Take 1 tablet by mouth daily with breakfast.    Yes [provider]  sertraline (ZOLOFT) 25 MG tablet Take 25 mg by mouth at bedtime.   Yes [provider]   . acyclovir  400 mg Oral BID  . calcium-vitamin D  1 tablet Oral QHS  . carbidopa-levodopa  1 tablet Oral BID WC  . carbidopa-levodopa  2 tablet Oral QHS  . cholecalciferol  1,000 Units Oral QPM  . entacapone  200 mg Oral TID  . loratadine  10 mg Oral Daily  . mirabegron ER  50 mg Oral Q1200  . mometasone-formoterol  2 puff Inhalation BID  . multivitamin with minerals  1 tablet Oral Q breakfast  . pantoprazole (PROTONIX) IV  40 mg Intravenous Q12H  . sertraline  25 mg Oral QHS  . sodium chloride flush  3 mL Intravenous Q12H   PRN Meds acetaminophen, hydrALAZINE, ondansetron (ZOFRAN) IV Results for orders placed or performed during the hospital encounter of 02/03/17 (from the past 48 hour(s))  Comprehensive metabolic panel     Status: Abnormal   Collection Time: 02/03/17  2:16 AM  Result Value Ref Range   Sodium 137 135 - 145 mmol/L   Potassium 4.0 3.5 - 5.1 mmol/L   Chloride 100 (L) 101 - 111 mmol/L   CO2 27 22 - 32 mmol/L   Glucose, Bld 133 (H) 65 - 99 mg/dL   BUN 37 (H) 6 - 20 mg/dL   Creatinine, Ser 1.27 (H) 0.44 - 1.00 mg/dL   Calcium 8.6 (L) 8.9 - 10.3 mg/dL   Total Protein 6.0 (L) 6.5 - 8.1 g/dL   Albumin 3.5 3.5 - 5.0 g/dL   AST 32 15 - 41 U/L   ALT 12 (L) 14 - 54 U/L   Alkaline Phosphatase 66 38 - 126 U/L   Total Bilirubin 1.1 0.3 - 1.2 mg/dL   GFR calc non Af Amer 37 (L) >60 mL/min   GFR calc Af Amer 43 (L) >60 mL/min     Comment: (NOTE) The eGFR has been calculated using the CKD EPI equation. This calculation has not been validated in all clinical situations. eGFR's persistently <60 mL/min signify possible Chronic Kidney Disease.    Anion gap 10 5 - 15  CBC     Status: Abnormal   Collection Time: 02/03/17  2:16 AM  Result Value Ref Range   WBC 4.1 4.0 - 10.5 K/uL   RBC 3.32 (L) 3.87 - 5.11  MIL/uL   Hemoglobin 9.7 (L) 12.0 - 15.0 g/dL   HCT 28.1 (L) 36.0 - 46.0 %   MCV 84.6 78.0 - 100.0 fL   MCH 29.2 26.0 - 34.0 pg   MCHC 34.5 30.0 - 36.0 g/dL   RDW 15.5 11.5 - 15.5 %   Platelets 139 (L) 150 - 400 K/uL  Type and screen [place]     Status: None (Preliminary result)   Collection Time: 02/03/17  2:19 AM  Result Value Ref Range   ABO/RH(D) O POS    Antibody Screen NEG    Sample Expiration 02/06/2017    Unit Number [id number]    Blood Component Type RED CELLS,LR    Unit division 00    Status of Unit ISSUED,FINAL    Transfusion Status OK TO TRANSFUSE    Crossmatch Result COMPATIBLE    Unit Number [id number]    Blood Component Type RED CELLS,LR    Unit division 00    Status of Unit ISSUED,FINAL    Transfusion Status OK TO TRANSFUSE    Crossmatch Result COMPATIBLE    Unit Number [id number]    Blood Component Type RED CELLS,LR    Unit division 00    Status of Unit ALLOCATED    Transfusion Status OK TO TRANSFUSE    Crossmatch Result COMPATIBLE    Unit Number [id number]    Blood Component Type RED CELLS,LR    Unit division 00    Status of Unit ALLOCATED    Transfusion Status OK TO TRANSFUSE    Crossmatch Result COMPATIBLE    Unit Number [id number]    Blood Component Type RED CELLS,LR    Unit division 00    Status of Unit ISSUED    Transfusion Status OK TO TRANSFUSE    Crossmatch Result COMPATIBLE   POC occult blood, ED     Status: Abnormal   Collection Time: 02/03/17  2:44 AM  Result Value Ref Range   Fecal Occult Bld POSITIVE (A) NEGATIVE  CBC      Status: Abnormal   Collection Time: 02/03/17  3:35 AM  Result Value Ref Range   WBC 4.4 4.0 - 10.5 K/uL   RBC 3.43 (L) 3.87 - 5.11 MIL/uL   Hemoglobin 9.7 (L) 12.0 - 15.0 g/dL   HCT 28.4 (L) 36.0 - 46.0 %   MCV 82.8 78.0 - 100.0 fL   MCH 28.3 26.0 - 34.0 pg   MCHC 34.2 30.0 - 36.0 g/dL   RDW 15.5 11.5 - 15.5 %   Platelets 137 (L) 150 - 400 K/uL  Protime-INR     Status: None   Collection Time: 02/03/17  3:35 AM  Result Value Ref Range   Prothrombin Time 14.5 11.4 - 15.2 seconds   INR 1.12   APTT     Status: None   Collection Time: 02/03/17  3:35 AM  Result Value Ref Range   aPTT 33 24 - 36 seconds  CBC     Status: None   Collection Time: 02/03/17 11:05 AM  Result Value Ref Range   WBC  4.0 - 10.5 K/uL    QUESTIONABLE RESULTS, RECOMMEND RECOLLECT TO VERIFY    Comment: INFORMED SANDRA,RN @ 1610 ON [id number] BY POTEAT,S CORRECTED ON 07/11 AT 1609: PREVIOUSLY REPORTED AS 2.8    RBC  3.87 - 5.11 MIL/uL    QUESTIONABLE RESULTS, RECOMMEND RECOLLECT TO VERIFY    Comment: INFORMED SANDRA,RN @ 1610 ON [id number] BY POTEAT,S CORRECTED ON 07/11 AT 1609: PREVIOUSLY  REPORTED AS 2.39    Hemoglobin  12.0 - 15.0 g/dL    QUESTIONABLE RESULTS, RECOMMEND RECOLLECT TO VERIFY    Comment: INFORMED SANDRA,RN @ 1610 ON [id number] BY POTEAT,S CORRECTED ON 07/11 AT 1609: PREVIOUSLY REPORTED AS 7.1 REPEATED TO VERIFY DELTA CHECK NOTED RESULT CHECKED    HCT  36.0 - 46.0 %    QUESTIONABLE RESULTS, RECOMMEND RECOLLECT TO VERIFY    Comment: INFORMED SANDRA,RN @ 1610 ON [id number] BY POTEAT,S CORRECTED ON 07/11 AT 1609: PREVIOUSLY REPORTED AS 20.3    MCV  78.0 - 100.0 fL    QUESTIONABLE RESULTS, RECOMMEND RECOLLECT TO VERIFY    Comment: INFORMED SANDRA,RN @ 1610 ON [id number] BY POTEAT,S CORRECTED ON 07/11 AT 1609: PREVIOUSLY REPORTED AS 84.9    MCH  26.0 - 34.0 pg    QUESTIONABLE RESULTS, RECOMMEND RECOLLECT TO VERIFY    Comment: INFORMED SANDRA,RN @ 1610 ON [id number] BY POTEAT,S CORRECTED ON 07/11 AT 1609: PREVIOUSLY  REPORTED AS 29.7    MCHC  30.0 - 36.0 g/dL    QUESTIONABLE RESULTS, RECOMMEND RECOLLECT TO VERIFY    Comment: INFORMED SANDRA,RN @ 1610 ON [id number] BY POTEAT,S CORRECTED ON 07/11 AT 1609: PREVIOUSLY REPORTED AS 35.0    RDW  11.5 - 15.5 %    QUESTIONABLE RESULTS, RECOMMEND RECOLLECT TO VERIFY    Comment: INFORMED SANDRA,RN @ 1610 ON [id number] BY POTEAT,S CORRECTED ON 07/11 AT 1609: PREVIOUSLY REPORTED AS 15.7    Platelets  150 - 400 K/uL    QUESTIONABLE RESULTS, RECOMMEND RECOLLECT TO VERIFY    Comment: INFORMED SANDRA,RN @ 1610 ON [id number] BY POTEAT,S CORRECTED ON 07/11 AT 1609: PREVIOUSLY REPORTED AS 103 REPEATED TO VERIFY SPECIMEN CHECKED FOR CLOTS PLATELET COUNT CONFIRMED BY SMEAR   CBC     Status: Abnormal   Collection Time: 02/03/17  3:22 PM  Result Value Ref Range   WBC 3.4 (L) 4.0 - 10.5 K/uL   RBC 3.07 (L) 3.87 - 5.11 MIL/uL   Hemoglobin 9.0 (L) 12.0 - 15.0 g/dL    Comment: DELTA CHECK NOTED REPEATED TO VERIFY RESULTS VERIFIED VIA RECOLLECT    HCT 26.3 (L) 36.0 - 46.0 %   MCV 85.7 78.0 - 100.0 fL   MCH 29.3 26.0 - 34.0 pg   MCHC 34.2 30.0 - 36.0 g/dL   RDW 15.7 (H) 11.5 - 15.5 %   Platelets 132 (L) 150 - 400 K/uL  CBC with Differential/Platelet     Status: Abnormal   Collection Time: 02/03/17  5:57 PM  Result Value Ref Range   WBC 3.7 (L) 4.0 - 10.5 K/uL   RBC 2.73 (L) 3.87 - 5.11 MIL/uL   Hemoglobin 8.0 (L) 12.0 - 15.0 g/dL   HCT 23.3 (L) 36.0 - 46.0 %   MCV 85.3 78.0 - 100.0 fL   MCH 29.3 26.0 - 34.0 pg   MCHC 34.3 30.0 - 36.0 g/dL   RDW 15.6 (H) 11.5 - 15.5 %   Platelets 127 (L) 150 - 400 K/uL   Neutrophils Relative % 51 %   Neutro Abs 1.9 1.7 - 7.7 K/uL   Lymphocytes Relative 36 %   Lymphs Abs 1.3 0.7 - 4.0 K/uL   Monocytes Relative 10 %   Monocytes Absolute 0.4 0.1 - 1.0 K/uL   Eosinophils Relative 2 %   Eosinophils Absolute 0.1 0.0 - 0.7 K/uL   Basophils Relative 1 %   Basophils Absolute 0.0 0.0 - 0.1 K/uL  MRSA PCR Screening     Status: None  Collection Time: 02/03/17  6:07 PM  Result Value Ref Range   MRSA by PCR NEGATIVE NEGATIVE    Comment:        The GeneXpert MRSA Assay (FDA approved for NASAL specimens only), is one component of a comprehensive MRSA colonization surveillance program. It is not intended to diagnose MRSA infection nor to guide or monitor treatment for MRSA infections.   Prepare RBC     Status: None   Collection Time: 02/03/17  8:19 PM  Result Value Ref Range   Order Confirmation ORDER PROCESSED BY BLOOD BANK   CBC     Status: Abnormal   Collection Time: 02/03/17  9:14 PM  Result Value Ref Range   WBC 4.3 4.0 - 10.5 K/uL   RBC 2.26 (L) 3.87 - 5.11 MIL/uL   Hemoglobin 6.6 (LL) 12.0 - 15.0 g/dL    Comment: REPEATED TO VERIFY CRITICAL RESULT CALLED TO, READ BACK BY AND VERIFIED WITH: E.BALLANTYNE,RN 2133 02/03/17 W.SHEA    HCT 19.5 (L) 36.0 - 46.0 %   MCV 86.3 78.0 - 100.0 fL   MCH 29.2 26.0 - 34.0 pg   MCHC 33.8 30.0 - 36.0 g/dL   RDW 15.7 (H) 11.5 - 15.5 %   Platelets 135 (L) 150 - 400 K/uL  Prepare RBC     Status: None   Collection Time: 02/03/17 10:30 PM  Result Value Ref Range   Order Confirmation ORDER PROCESSED BY BLOOD BANK   Basic metabolic panel     Status: Abnormal   Collection Time: 02/04/17  6:28 AM  Result Value Ref Range   Sodium 139 135 - 145 mmol/L   Potassium 3.9 3.5 - 5.1 mmol/L   Chloride 111 101 - 111 mmol/L   CO2 22 22 - 32 mmol/L   Glucose, Bld 99 65 - 99 mg/dL   BUN 26 (H) 6 - 20 mg/dL   Creatinine, Ser 0.95 0.44 - 1.00 mg/dL   Calcium 6.7 (L) 8.9 - 10.3 mg/dL   GFR calc non Af Amer 52 (L) >60 mL/min   GFR calc Af Amer >60 >60 mL/min    Comment: (NOTE) The eGFR has been calculated using the CKD EPI equation. This calculation has not been validated in all clinical situations. eGFR's persistently <60 mL/min signify possible Chronic Kidney Disease.    Anion gap 6 5 - 15  CBC     Status: Abnormal   Collection Time: 02/04/17  6:28 AM  Result Value Ref Range    WBC 3.0 (L) 4.0 - 10.5 K/uL   RBC 2.96 (L) 3.87 - 5.11 MIL/uL   Hemoglobin 8.8 (L) 12.0 - 15.0 g/dL   HCT 24.8 (L) 36.0 - 46.0 %   MCV 83.8 78.0 - 100.0 fL   MCH 29.7 26.0 - 34.0 pg   MCHC 35.5 30.0 - 36.0 g/dL   RDW 15.5 11.5 - 15.5 %   Platelets 89 (L) 150 - 400 K/uL    Comment: REPEATED TO VERIFY SPECIMEN CHECKED FOR CLOTS PLATELET COUNT CONFIRMED BY SMEAR     No results found. ROS: As above            Blood pressure (!) 124/56, pulse 87, temperature 98.8 F (37.1 C), temperature source Oral, resp. rate 17, SpO2 98 %.  Physical exam:   General-- pleasant white female somewhat hard of hearing. ENT-- nonicteric  Heart-- regular rate and rhythm without murmurs are gallops Lungs-- clear Abdomen-- nondistended and soft with minimal if any epigastric tenderness Psych-- alert and  oriented answers questions appropriately Assessment: 1. Acute G.I. bleed. I think this is most likely lower G.I. bleed however, given history of dark stools, chest and epigastric discomfort, and chronic NSAID use with slight elevation of BUN I think an upper G.I. bleed needs to be ruled out 1st. She does have a history of diverticulosis by colonoscopy number of years ago and this is likely the source of her bleeding. 2. History of breast cancer and non-Hodgkin's lymphoma in remission 3. Parkinson's disease  Plan: I agree with empiric PPI therapy. Suspicion is that this is likely a lower G.I. bleed due to diverticular disease but we need to rule out upper G.I. bleed. We will go ahead with EGD today I Dr. Paulita Fujita. Have discussed this in detail with the patient and her husband and this will be added on later today.   Athony Coppa JR,Kriste Broman L 02/04/2017, 9:35 AM   This note was created using voice recognition software and minor errors may Have occurred unintentionally. Pager: [phone] If no answer or after hours call [phone]

## 2017-02-04 NOTE — Anesthesia Postprocedure Evaluation (Signed)
Anesthesia Post Note  Patient: [name]  Procedure(s) Performed: Procedure(s) (LRB): ESOPHAGOGASTRODUODENOSCOPY (EGD) (N/A)     Patient location during evaluation: PACU Anesthesia Type: MAC Level of consciousness: awake and alert and oriented Pain management: pain level controlled Vital Signs Assessment: post-procedure vital signs reviewed and stable Respiratory status: spontaneous breathing, nonlabored ventilation and respiratory function stable Cardiovascular status: stable and blood pressure returned to baseline Postop Assessment: no signs of nausea or vomiting Anesthetic complications: no    Last Vitals:  Vitals:   02/04/17 1135 02/04/17 1140  BP:  (!) 182/64  Pulse: 87 86  Resp: 20 (!) 21  Temp:      Last Pain:  Vitals:   02/04/17 1013  TempSrc: Oral                 Zenna Traister A.

## 2017-02-04 NOTE — Anesthesia Preprocedure Evaluation (Signed)
Anesthesia Evaluation  Patient identified by MRN, date of birth, ID band Patient awake    Reviewed: Allergy & Precautions, NPO status , Patient's Chart, lab work & pertinent test results  Airway Mallampati: II  TM Distance: >3 FB Neck ROM: Full    Dental  (+) Upper Dentures, Lower Dentures   Pulmonary asthma , pneumonia, resolved,    Pulmonary exam normal breath sounds clear to auscultation       Cardiovascular hypertension, Pt. on medications + Valvular Problems/Murmurs AS, AI and MR  Rhythm:Regular Rate:Normal + Systolic murmurs and + Diastolic murmurs Moderate AS   Neuro/Psych PSYCHIATRIC DISORDERS Anxiety Depression Bilateral hearing deficits Parkinson's disease    GI/Hepatic Neg liver ROS, GERD  Medicated and Controlled,GI bleeding   Endo/Other  diabetes, Well Controlled, Type 2Hx/o Breast Ca  Renal/GU Renal InsufficiencyRenal disease  negative genitourinary   Musculoskeletal   Abdominal   Peds  Hematology  (+) anemia , Hx/o Non Hodgkin's lymphoma Thrombocytopenia   Anesthesia Other Findings   Reproductive/Obstetrics                             Anesthesia Physical Anesthesia Plan  ASA: III  Anesthesia Plan: MAC   Post-op Pain Management:    Induction: Intravenous  PONV Risk Score and Plan: 2 and Propofol and Ondansetron  Airway Management Planned: Natural Airway and Nasal Cannula  Additional Equipment:   Intra-op Plan:   Post-operative Plan:   Informed Consent: I have reviewed the patients History and Physical, chart, labs and discussed the procedure including the risks, benefits and alternatives for the proposed anesthesia with the patient or authorized representative who has indicated his/her understanding and acceptance.   Dental advisory given  Plan Discussed with: CRNA, Anesthesiologist and Surgeon  Anesthesia Plan Comments:         Anesthesia Quick  Evaluation

## 2017-02-04 NOTE — Care Management Note (Signed)
Case Management Note  Patient Details  Name: [name] MRN: [id number] Date of Birth: 20-May-1928  Subjective/Objective:                  Blood in stool  Action/Plan: Date:  February 04, 2017  Chart reviewed for concurrent status and case management needs.  Will continue to follow patient progress.  Discharge Planning: following for needs /lives at home with spouse Expected discharge date: [id number]  Velva Harman, BSN, [place], [place]   Expected Discharge Date:   (unknown)               Expected Discharge Plan:  Home/Self Care  In-House Referral:     Discharge planning Services  CM Consult  Post Acute Care Choice:    Choice offered to:     DME Arranged:    DME Agency:     HH Arranged:    [place] Agency:     Status of Service:  In process, will continue to follow  If discussed at Long Length of Stay Meetings, dates discussed:    Additional Comments:  Leeroy Cha, RN 02/04/2017, 9:26 AM

## 2017-02-04 NOTE — Op Note (Signed)
[facility] Patient Name: [name] Procedure Date: 02/04/2017 MRN: [id number] Attending MD: Arta Silence , MD Date of Birth: 11-Oct-1927 CSN: [id number] Age: 83 Admit Type: Inpatient Procedure:                Upper GI endoscopy Indications:              Acute post hemorrhagic anemia, Hematochezia Providers:                Arta Silence, MD, Cleda Daub, RN, Cherylynn Ridges, Technician, Derrek Gu. Alday CRNA, CRNA Referring MD:              Medicines:                Monitored Anesthesia Care Complications:            No immediate complications. Estimated Blood Loss:     Estimated blood loss: none. Procedure:                Pre-Anesthesia Assessment:                           - Prior to the procedure, a History and Physical                            was performed, and patient medications and                            allergies were reviewed. The patient's tolerance of                            previous anesthesia was also reviewed. The risks                            and benefits of the procedure and the sedation                            options and risks were discussed with the patient.                            All questions were answered, and informed consent                            was obtained. Prior Anticoagulants: The patient has                            taken no previous anticoagulant or antiplatelet                            agents. ASA Grade Assessment: III - A patient with                            severe systemic disease. After reviewing the risks  and benefits, the patient was deemed in                            satisfactory condition to undergo the procedure.                           After obtaining informed consent, the endoscope was                            passed under direct vision. Throughout the                            procedure, the patient's blood pressure, pulse,  and                            oxygen saturations were monitored continuously. The                            [id number]) scope was introduced through the                            mouth, and advanced to the second part of duodenum.                            The upper GI endoscopy was accomplished without                            difficulty. The patient tolerated the procedure                            well. Scope In: Scope Out: Findings:      A small hiatal hernia was present.      The exam of the esophagus was otherwise normal.      The entire examined stomach was normal.      The duodenal bulb, first portion of the duodenum and second portion of       the duodenum were normal.      No old or fresh blood was seen to the extent of our examination. Impression:               - Small hiatal hernia.                           - Normal stomach.                           - Normal duodenal bulb, first portion of the                            duodenum and second portion of the duodenum.                           - No bleeding source seen; suspect GI bleeding is  lower GI tract in etiology. Moderate Sedation:      None Recommendation:           - Return patient to hospital ward for ongoing care.                           - Clear liquid diet today.                           - Continue present medications.                           [facility] will follow.                           - If patient has further hematochezia, would pursue                            tagged RBC study as next step in management. Procedure Code(s):        --- Professional ---                           [id number], Esophagogastroduodenoscopy, flexible,                            transoral; diagnostic, including collection of                            specimen(s) by brushing or washing, when performed                            (separate procedure) Diagnosis Code(s):        ---  Professional ---                           K44.9, Diaphragmatic hernia without obstruction or                            gangrene                           D62, Acute posthemorrhagic anemia                           K92.1, Melena (includes Hematochezia) CPT copyright 2016 [employer]. All rights reserved. The codes documented in this report are preliminary and upon coder review may  be revised to meet current compliance requirements. Arta Silence, MD 02/04/2017 11:33:06 AM This report has been signed electronically. Number of Addenda: 0

## 2017-02-04 NOTE — Progress Notes (Addendum)
PROGRESS NOTE    [name]  IZT:[id number] DOB: Feb 08, 1928 DOA: 02/03/2017 PCP: [name], MD     Brief Narrative:  83 year old female who presented with bloody stools. Patient is known to have hypertension, Parkinson's disease, non-Hodgkin's lymphoma. She developed sudden onset of hematochezia, no associated dyspnea or lightheadedness. Patient has been taking NSAIDs at home. On initial physical examination blood pressure 156/73, heart rate 81, respirations 16, temperature 98., oxygen saturation 98%. Moist mucous membranes, lungs clear to auscultation bilaterally, no wheezing rales or rhonchi, heart S1-S2 present and rhythmic, positive systolic murmur 3 out of 6, radiated into the carotids, her abdomen was soft nontender no organomegaly or peritoneal signs, no lower extremity edema. Sodium 137, potassium 4.0, chloride 100, bicarbonate 27, glucose 133, BUN 37, creatinine 1.27, white count 4.1, hemoglobin 9.7, hematocrit 28.1, platelets 139. Positive fecal occult blood.   Patient was admitted to the hospital with the working diagnosis of gastrointestinal bleeding, upper versus lower in origin, complicated by acute kidney injury and thrombocytopenia. Patient continued to bleed overnight, her hemoglobin dropped to 6.6, she was transferred to the stepdown unit, received 3 units of packed red blood cells, gastroenterology has been consulted for upper endoscopy.    Assessment & Plan:   Active Problems:   GIB (gastrointestinal bleeding)   1. Gastrointestinal bleeding, rule out upper GI bleed. Patient has received 3 units of PRBC, will continue to follow on H&H, continue bleeding with bloody bowel movements, no hematemesis, no resting tachycardia. Will continue aggressive antiacid therapy and will follow on GI recommendations, patient may need endoscopy.   2. Acute blood loss anemia. Patient has received 3 units of PRBC, with good toleration, no signs of volume overload, will follow on  cell count in am and H&H q 6 hours.  3. Acute kidney injury. Renal function with cr down to 0.95 with K at 3.9 and serum bicarbonate at 22, will continue IV fluids and as needed blood transfusion, will follow renal panel in am. Avoid hypotension and nephrotoxic medications.   4. Thrombocytopenia. Suspected to be reactive, noted to be worsening, now down to 89 from 135, hold on platelet transfusion for now.   5. Hypertension. Continue to hold on antihypertensive agents, blood pressure systolic 382 to 505.   6. Parkinsonism. Will continue sinement, when patient able to take po meds, continue neuro checks per unit protocol.   7. Depression. Continue sertraline.    DVT prophylaxis: scd Code Status: full   Family Communication:  Disposition Plan: home    Consultants:   Gastroenterology   Procedures:    Antimicrobials:    Subjective: Patient with persistent bloody stools, no chest pain or dyspnea. Had 3 units prbc transfused and is NPO. Had not GI blees in the past. No nausea or diarrhea, no significant abdominal pain.   Objective: Vitals:   02/04/17 0500 02/04/17 0600 02/04/17 0700 02/04/17 0730  BP: (!) 131/51 135/65 (!) 124/52 (!) 139/59  Pulse: 78 83 82 83  Resp: 19 18 (!) 23 (!) 22  Temp:      TempSrc:      SpO2: 100% 99% 97% 98%    Intake/Output Summary (Last 24 hours) at 02/04/17 0809 Last data filed at 02/04/17 0600  Gross per 24 hour  Intake          3330.92 ml  Output                0 ml  Net          3330.92  ml   There were no vitals filed for this visit.  Examination:  General exam: deconditioned E ENT: positive pallor, no icterus, oral mucosa dry.   Respiratory system: No wheezing, rales or rhonchi. Respiratory effort normal. Cardiovascular system: S1 & S2 heard, RRR. No JVD, rubs, gallops or clicks. No pedal edema. Positive systolic murmur at the base, 3/6 radiated to the carotids.  Gastrointestinal system: Abdomen is nondistended, soft and nontender.  No organomegaly or masses felt. Normal bowel sounds heard. Central nervous system: Alert and oriented. No focal neurological deficits. Extremities: Symmetric 5 x 5 power. Skin: No rashes, lesions or ulcers     Data Reviewed: I have personally reviewed following labs and imaging studies  CBC:  Recent Labs Lab 02/03/17 1105 02/03/17 1522 02/03/17 1757 02/03/17 2114 02/04/17 0628  WBC QUESTIONABLE RESULTS, RECOMMEND RECOLLECT TO VERIFY 3.4* 3.7* 4.3 3.0*  NEUTROABS  --   --  1.9  --   --   HGB QUESTIONABLE RESULTS, RECOMMEND RECOLLECT TO VERIFY 9.0* 8.0* 6.6* 8.8*  HCT QUESTIONABLE RESULTS, RECOMMEND RECOLLECT TO VERIFY 26.3* 23.3* 19.5* 24.8*  MCV QUESTIONABLE RESULTS, RECOMMEND RECOLLECT TO VERIFY 85.7 85.3 86.3 83.8  PLT QUESTIONABLE RESULTS, RECOMMEND RECOLLECT TO VERIFY 132* 127* 135* 89*   Basic Metabolic Panel:  Recent Labs Lab 02/03/17 0216 02/04/17 0628  NA 137 139  K 4.0 3.9  CL 100* 111  CO2 27 22  GLUCOSE 133* 99  BUN 37* 26*  CREATININE 1.27* 0.95  CALCIUM 8.6* 6.7*   GFR: CrCl cannot be calculated (Unknown ideal weight.). Liver Function Tests:  Recent Labs Lab 02/03/17 0216  AST 32  ALT 12*  ALKPHOS 66  BILITOT 1.1  PROT 6.0*  ALBUMIN 3.5   No results for input(s): LIPASE, AMYLASE in the last 168 hours. No results for input(s): AMMONIA in the last 168 hours. Coagulation Profile:  Recent Labs Lab 02/03/17 0335  INR 1.12   Cardiac Enzymes: No results for input(s): CKTOTAL, CKMB, CKMBINDEX, TROPONINI in the last 168 hours. BNP (last 3 results) No results for input(s): PROBNP in the last 8760 hours. HbA1C: No results for input(s): HGBA1C in the last 72 hours. CBG: No results for input(s): GLUCAP in the last 168 hours. Lipid Profile: No results for input(s): CHOL, HDL, LDLCALC, TRIG, CHOLHDL, LDLDIRECT in the last 72 hours. Thyroid Function Tests: No results for input(s): TSH, T4TOTAL, FREET4, T3FREE, THYROIDAB in the last 72  hours. Anemia Panel: No results for input(s): VITAMINB12, FOLATE, FERRITIN, TIBC, IRON, RETICCTPCT in the last 72 hours. Sepsis Labs: No results for input(s): PROCALCITON, LATICACIDVEN in the last 168 hours.  Recent Results (from the past 240 hour(s))  MRSA PCR Screening     Status: None   Collection Time: 02/03/17  6:07 PM  Result Value Ref Range Status   MRSA by PCR NEGATIVE NEGATIVE Final    Comment:        The GeneXpert MRSA Assay (FDA approved for NASAL specimens only), is one component of a comprehensive MRSA colonization surveillance program. It is not intended to diagnose MRSA infection nor to guide or monitor treatment for MRSA infections.          Radiology Studies: No results found.      Scheduled Meds: . acyclovir  400 mg Oral BID  . calcium-vitamin D  1 tablet Oral QHS  . carbidopa-levodopa  1 tablet Oral BID WC  . carbidopa-levodopa  2 tablet Oral QHS  . cholecalciferol  1,000 Units Oral QPM  . entacapone  200 mg Oral TID  . loratadine  10 mg Oral Daily  . mirabegron ER  50 mg Oral Q1200  . mometasone-formoterol  2 puff Inhalation BID  . multivitamin with minerals  1 tablet Oral Q breakfast  . pantoprazole (PROTONIX) IV  40 mg Intravenous Q12H  . sertraline  25 mg Oral QHS  . sodium chloride flush  3 mL Intravenous Q12H   Continuous Infusions: . sodium chloride 75 mL/hr at 02/04/17 0120     LOS: 0 days        Tawni Millers, MD [facility] Pager [phone]  If 7PM-7AM, please contact night-coverage [email or web address] Password [email or web address] 02/04/2017, 8:09 AM

## 2017-02-04 NOTE — Transfer of Care (Signed)
Immediate Anesthesia Transfer of Care Note  Patient: [name]  Procedure(s) Performed: Procedure(s): ESOPHAGOGASTRODUODENOSCOPY (EGD) (N/A)  Patient Location: PACU  Anesthesia Type:MAC  Level of Consciousness: sedated  Airway & Oxygen Therapy: Patient Spontanous Breathing and Patient connected to nasal cannula oxygen  Post-op Assessment: Report given to RN and Post -op Vital signs reviewed and stable  Post vital signs: Reviewed and stable  Last Vitals:  Vitals:   02/04/17 0900 02/04/17 1013  BP: (!) 124/56 (!) 128/49  Pulse: 87 84  Resp: 17 (!) 22  Temp:  36.8 C    Last Pain:  Vitals:   02/04/17 1013  TempSrc: Oral         Complications: No apparent anesthesia complications

## 2017-02-05 ENCOUNTER — Encounter (HOSPITAL_COMMUNITY): Payer: Self-pay | Admitting: Gastroenterology

## 2017-02-05 DIAGNOSIS — K625 Hemorrhage of anus and rectum: Secondary | ICD-10-CM

## 2017-02-05 LAB — URINALYSIS, ROUTINE W REFLEX MICROSCOPIC
Bacteria, UA: NONE SEEN
Bilirubin Urine: NEGATIVE
Glucose, UA: NEGATIVE mg/dL
Ketones, ur: NEGATIVE mg/dL
Leukocytes, UA: NEGATIVE
Nitrite: NEGATIVE
Protein, ur: NEGATIVE mg/dL
RBC / HPF: NONE SEEN RBC/hpf (ref 0–5)
Specific Gravity, Urine: 1.008 (ref 1.005–1.030)
pH: 5 (ref 5.0–8.0)

## 2017-02-05 LAB — BASIC METABOLIC PANEL WITH GFR
Anion gap: 5 (ref 5–15)
BUN: 15 mg/dL (ref 6–20)
CO2: 21 mmol/L — ABNORMAL LOW (ref 22–32)
Calcium: 7 mg/dL — ABNORMAL LOW (ref 8.9–10.3)
Chloride: 113 mmol/L — ABNORMAL HIGH (ref 101–111)
Creatinine, Ser: 0.91 mg/dL (ref 0.44–1.00)
GFR calc Af Amer: >60 mL/min (ref >60)
GFR calc non Af Amer: 55 mL/min — ABNORMAL LOW (ref >60)
Glucose, Bld: 98 mg/dL (ref 65–99)
Potassium: 3.8 mmol/L (ref 3.5–5.1)
Sodium: 139 mmol/L (ref 135–145)

## 2017-02-05 LAB — CBC WITH DIFFERENTIAL/PLATELET
BASOS PCT: 1 %
Basophils Absolute: 0 10*3/uL (ref 0.0–0.1)
EOS ABS: 0.1 10*3/uL (ref 0.0–0.7)
Eosinophils Relative: 4 %
HCT: 23.2 % — ABNORMAL LOW (ref 36.0–46.0)
HEMOGLOBIN: 8.2 g/dL — AB (ref 12.0–15.0)
Lymphocytes Relative: 33 %
Lymphs Abs: 1 10*3/uL (ref 0.7–4.0)
MCH: 29.3 pg (ref 26.0–34.0)
MCHC: 35.3 g/dL (ref 30.0–36.0)
MCV: 82.9 fL (ref 78.0–100.0)
MONOS PCT: 10 %
Monocytes Absolute: 0.3 10*3/uL (ref 0.1–1.0)
NEUTROS PCT: 53 %
Neutro Abs: 1.7 10*3/uL (ref 1.7–7.7)
Platelets: 91 10*3/uL — ABNORMAL LOW (ref 150–400)
RBC: 2.8 MIL/uL — ABNORMAL LOW (ref 3.87–5.11)
RDW: 15.9 % — ABNORMAL HIGH (ref 11.5–15.5)
WBC: 3.2 10*3/uL — AB (ref 4.0–10.5)

## 2017-02-05 LAB — HEMOGLOBIN AND HEMATOCRIT, BLOOD
HEMATOCRIT: 22.6 % — AB (ref 36.0–46.0)
HEMOGLOBIN: 8 g/dL — AB (ref 12.0–15.0)

## 2017-02-05 LAB — CBC
HCT: 24.8 % — ABNORMAL LOW (ref 36.0–46.0)
HEMOGLOBIN: 8.8 g/dL — AB (ref 12.0–15.0)
MCH: 29.7 pg (ref 26.0–34.0)
MCHC: 35.5 g/dL (ref 30.0–36.0)
MCV: 83.8 fL (ref 78.0–100.0)
Platelets: 89 10*3/uL — ABNORMAL LOW (ref 150–400)
RBC: 2.96 MIL/uL — AB (ref 3.87–5.11)
RDW: 15.5 % (ref 11.5–15.5)
WBC: 3 10*3/uL — ABNORMAL LOW (ref 4.0–10.5)

## 2017-02-05 MED ORDER — PANTOPRAZOLE SODIUM 40 MG PO TBEC
40.0000 mg | DELAYED_RELEASE_TABLET | Freq: Two times a day (BID) | ORAL | Status: DC
  Administered 2017-02-05 – 2017-02-06 (×3): 40 mg via ORAL
  Filled 2017-02-05 (×3): qty 1

## 2017-02-05 MED ORDER — POLYETHYLENE GLYCOL 3350 17 G PO PACK
17.0000 g | PACK | Freq: Two times a day (BID) | ORAL | Status: DC
  Administered 2017-02-05 (×2): 17 g via ORAL
  Filled 2017-02-05 (×2): qty 1

## 2017-02-05 MED ORDER — HYDRALAZINE HCL 20 MG/ML IJ SOLN
10.0000 mg | INTRAMUSCULAR | Status: DC | PRN
  Filled 2017-02-05: qty 0.5

## 2017-02-05 NOTE — Progress Notes (Signed)
[facility] PROGRESS NOTE Subjective patient had no more bleeding per nursing staff. EGD yesterday by Dr. Paulita Fujita was negative for upper G.I. bleed. She is on a clear liquid diet. She has had a total of 3 units P RBCs last unit early yesterday morning.  Objective: Vital signs in last 24 hours: Temp:  [98 F (36.7 C)-98.8 F (37.1 C)] 98.4 F (36.9 C) (07/13 0346) Pulse Rate:  [78-94] 79 (07/13 0700) Resp:  [16-29] 19 (07/13 0700) BP: (105-197)/(49-87) 158/52 (07/13 0700) SpO2:  [95 %-100 %] 95 % (07/13 0715) Weight:  [57.3 kg (126 lb 5.2 oz)-61.1 kg (134 lb 11.2 oz)] 61.1 kg (134 lb 11.2 oz) (07/13 0300) Last BM Date: 02/04/17  Intake/Output from previous day: 07/12 0701 - 07/13 0700 In: 2803.8 [P.O.:300; I.V.:2503.8] Out: 1104 [Urine:1100; Stool:4] Intake/Output this shift: No intake/output data recorded.  PE: General-- patient just waking up somewhat confused  Abdomen-- soft and completely nontender  Lab Results:  Recent Labs  02/03/17 1522 02/03/17 1757 02/03/17 2114 02/04/17 0628 02/04/17 1333 02/04/17 1820 02/05/17 0001 02/05/17 0627  WBC 3.4* 3.7* 4.3 3.0*  --   --  3.2*  --   HGB 9.0* 8.0* 6.6* 8.8* 8.8* 8.2* 8.2* 8.0*  HCT 26.3* 23.3* 19.5* 24.8* 24.9* 23.0* 23.2* 22.6*  PLT 132* 127* 135* 89*  --   --  91*  --    BMET  Recent Labs  02/03/17 0216 02/04/17 0628 02/05/17 0001  NA 137 139 139  K 4.0 3.9 3.8  CL 100* 111 113*  CO2 27 22 21*  CREATININE 1.27* 0.95 0.91   LFT  Recent Labs  02/03/17 0216  PROT 6.0*  AST 32  ALT 12*  ALKPHOS 66  BILITOT 1.1   PT/INR  Recent Labs  02/03/17 0335  LABPROT 14.5  INR 1.12   PANCREAS No results for input(s): LIPASE in the last 72 hours.       Studies/Results: No results found.  Medications: I have reviewed the patient's current medications.  Assessment/Plan: 1. Lower G.I. bleed. Appears to have resolved. She did have diverticulosis on colonoscopy number of years ago. She  seems to be doing fairly well at this time. Have discussed diverticulosis with her. We will start her own Miralax here in the hospital and I have encouraged her to take Miralax at home. She states that she apparently has some at home but doesn't normally take it. If her hemoglobin remains stable and there is no signs of further bleeding her diet can be advanced. She can follow up with Dr. Amedeo Plenty at a later date   [facility] 02/05/2017, 7:28 AM  This note was created using voice recognition software. Minor errors may Have occurred unintentionally.  Pager: [phone] If no answer or after hours call [phone]

## 2017-02-05 NOTE — Progress Notes (Signed)
Pt arrived to the floor about 0930. Pts assessment has remained unchanged from this morning. Pt has family and friends at the bedside. Pt denies pain at this time with no s/s of distress noted. Call bell is within reach.

## 2017-02-05 NOTE — Progress Notes (Signed)
PROGRESS NOTE    [name]  JYN:[id number] DOB: 08/03/1927 DOA: 02/03/2017 PCP: [name], MD    Brief Narrative:  83 year old female who presented with bloody stools. Patient is known to have hypertension, Parkinson's disease, non-Hodgkin's lymphoma. She developed sudden onset of hematochezia, no associated dyspnea or lightheadedness. Patient has been taking NSAIDs at home. On initial physical examination blood pressure 156/73, heart rate 81, respirations 16, temperature 98., oxygen saturation 98%. Moist mucous membranes, lungs clear to auscultation bilaterally, no wheezing rales or rhonchi, heart S1-S2 present and rhythmic, positive systolic murmur 3 out of 6, radiated into the carotids, her abdomen was soft nontender no organomegaly or peritoneal signs, no lower extremity edema. Sodium 137, potassium 4.0, chloride 100, bicarbonate 27, glucose 133, BUN 37, creatinine 1.27, white count 4.1, hemoglobin 9.7, hematocrit 28.1, platelets 139. Positive fecal occult blood.   Patient was admitted to the hospital with the working diagnosis of gastrointestinal bleeding, upper versus lower in origin, complicated by acute kidney injury and thrombocytopenia. Patient continued to bleed overnight, her hemoglobin dropped to 6.6, she was transferred to the stepdown unit, received 3 units of packed red blood cells, gastroenterology has been consulted for upper endoscopy.    Assessment & Plan:   Active Problems:   GIB (gastrointestinal bleeding)  1. Gastrointestinal bleeding, assumed to be lower GI bleed. Patient with no further bleeding, upper endoscopy with no signs of bleeding, will continue pantoprazole, diet has been advanced, patient may need colonoscopy, possible as outpatient, will follow on GI recommendations. Patient had diagnoses diverticulosis in the past per colonoscopy.   2. Acute blood loss anemia. Patient has received 3 units of PRBC, hb has been stable ay 8.0, will continue  monitoring cell count.   3. Acute kidney injury. Preserved renal function with cr at 0.91 with K at 3,8, will follow on renal panel in am, will hold on IV fluids, patient tolerating po well.   4. Thrombocytopenia. Reactive, at 91 from 89, trending up, will follow on cell count in am.   5. Hypertension. Systolic 865 to 784, with episodic elevation up to  200, will continue as needed hydralazine, for blood pressure control.   6. Parkinsonism. On sinement, physical therapy evaluation and out of bed as tolerated.    7. Depression. Continue sertraline per home regimen.    DVT prophylaxis: scd Code Status: full   Family Communication:  Disposition Plan: home    Consultants:   Gastroenterology   Procedures:    Antimicrobials:     Subjective: Patient feeling better, no nausea or vomiting, no abdominal pain, no chest pain or dyspnea. Upper endoscopy with no signs of bleeding, tolerating po well, no further bleeding.   Objective: Vitals:   02/05/17 0700 02/05/17 0715 02/05/17 0749 02/05/17 0800  BP: (!) 158/52     Pulse: 79     Resp: 19     Temp:    98.4 F (36.9 C)  TempSrc:    Oral  SpO2: 96% 95% 98%   Weight:      Height:        Intake/Output Summary (Last 24 hours) at 02/05/17 0809 Last data filed at 02/05/17 0700  Gross per 24 hour  Intake          2803.75 ml  Output             1103 ml  Net          1700.75 ml   Filed Weights   02/04/17 1013 02/05/17 0300  Weight: 57.3 kg (126 lb 5.2 oz) 61.1 kg (134 lb 11.2 oz)    Examination:  General exam:deconditioned E ENT. Mild pallor no icterus, oral mucosa moist.  Respiratory system: Clear to auscultation. Respiratory effort normal. No wheezing, rales or rhonchi.  Cardiovascular system: S1 & S2 heard, RRR. No JVD, murmurs, rubs, gallops or clicks. No pedal edema. Gastrointestinal system: Abdomen is nondistended, soft and nontender. No organomegaly or masses felt. Normal bowel sounds heard. Central  nervous system: Alert and oriented. No focal neurological deficits. Extremities: Symmetric 5 x 5 power. Skin: No rashes, lesions or ulcers     Data Reviewed: I have personally reviewed following labs and imaging studies  CBC:  Recent Labs Lab 02/03/17 1522 02/03/17 1757 02/03/17 2114 02/04/17 0628 02/04/17 1333 02/04/17 1820 02/05/17 0001 02/05/17 0627  WBC 3.4* 3.7* 4.3 3.0*  --   --  3.2*  --   NEUTROABS  --  1.9  --   --   --   --  1.7  --   HGB 9.0* 8.0* 6.6* 8.8* 8.8* 8.2* 8.2* 8.0*  HCT 26.3* 23.3* 19.5* 24.8* 24.9* 23.0* 23.2* 22.6*  MCV 85.7 85.3 86.3 83.8  --   --  82.9  --   PLT 132* 127* 135* 89*  --   --  91*  --    Basic Metabolic Panel:  Recent Labs Lab 02/03/17 0216 02/04/17 0628 02/05/17 0001  NA 137 139 139  K 4.0 3.9 3.8  CL 100* 111 113*  CO2 27 22 21*  GLUCOSE 133* 99 98  BUN 37* 26* 15  CREATININE 1.27* 0.95 0.91  CALCIUM 8.6* 6.7* 7.0*   GFR: Estimated Creatinine Clearance: 36 mL/min (by C-G formula based on SCr of 0.91 mg/dL). Liver Function Tests:  Recent Labs Lab 02/03/17 0216  AST 32  ALT 12*  ALKPHOS 66  BILITOT 1.1  PROT 6.0*  ALBUMIN 3.5   No results for input(s): LIPASE, AMYLASE in the last 168 hours. No results for input(s): AMMONIA in the last 168 hours. Coagulation Profile:  Recent Labs Lab 02/03/17 0335  INR 1.12   Cardiac Enzymes: No results for input(s): CKTOTAL, CKMB, CKMBINDEX, TROPONINI in the last 168 hours. BNP (last 3 results) No results for input(s): PROBNP in the last 8760 hours. HbA1C: No results for input(s): HGBA1C in the last 72 hours. CBG: No results for input(s): GLUCAP in the last 168 hours. Lipid Profile: No results for input(s): CHOL, HDL, LDLCALC, TRIG, CHOLHDL, LDLDIRECT in the last 72 hours. Thyroid Function Tests: No results for input(s): TSH, T4TOTAL, FREET4, T3FREE, THYROIDAB in the last 72 hours. Anemia Panel: No results for input(s): VITAMINB12, FOLATE, FERRITIN, TIBC, IRON,  RETICCTPCT in the last 72 hours. Sepsis Labs: No results for input(s): PROCALCITON, LATICACIDVEN in the last 168 hours.  Recent Results (from the past 240 hour(s))  MRSA PCR Screening     Status: None   Collection Time: 02/03/17  6:07 PM  Result Value Ref Range Status   MRSA by PCR NEGATIVE NEGATIVE Final    Comment:        The GeneXpert MRSA Assay (FDA approved for NASAL specimens only), is one component of a comprehensive MRSA colonization surveillance program. It is not intended to diagnose MRSA infection nor to guide or monitor treatment for MRSA infections.          Radiology Studies: No results found.      Scheduled Meds: . acyclovir  400 mg Oral BID  . calcium-vitamin D  1 tablet Oral QHS  . carbidopa-levodopa  1 tablet Oral BID WC  . carbidopa-levodopa  2 tablet Oral QHS  . cholecalciferol  1,000 Units Oral QPM  . entacapone  200 mg Oral TID  . loratadine  10 mg Oral Daily  . mirabegron ER  50 mg Oral Q1200  . mometasone-formoterol  2 puff Inhalation BID  . multivitamin with minerals  1 tablet Oral Q breakfast  . pantoprazole (PROTONIX) IV  40 mg Intravenous Q12H  . sertraline  25 mg Oral QHS  . sodium chloride flush  3 mL Intravenous Q12H   Continuous Infusions: . sodium chloride 75 mL/hr at 02/05/17 0700  . sodium chloride       LOS: 1 day        Tawni Millers, MD [facility] Pager [phone]  If 7PM-7AM, please contact night-coverage [email or web address] Password [email or web address] 02/05/2017, 8:09 AM

## 2017-02-06 ENCOUNTER — Encounter (HOSPITAL_COMMUNITY): Payer: Self-pay

## 2017-02-06 DIAGNOSIS — F329 Major depressive disorder, single episode, unspecified: Secondary | ICD-10-CM

## 2017-02-06 LAB — BASIC METABOLIC PANEL
ANION GAP: 9 (ref 5–15)
BUN: 7 mg/dL (ref 6–20)
CALCIUM: 7.9 mg/dL — AB (ref 8.9–10.3)
CHLORIDE: 113 mmol/L — AB (ref 101–111)
CO2: 19 mmol/L — AB (ref 22–32)
CREATININE: 0.91 mg/dL (ref 0.44–1.00)
GFR calc non Af Amer: 55 mL/min — ABNORMAL LOW (ref >60)
Glucose, Bld: 102 mg/dL — ABNORMAL HIGH (ref 65–99)
Potassium: 4 mmol/L (ref 3.5–5.1)
SODIUM: 141 mmol/L (ref 135–145)

## 2017-02-06 LAB — CBC WITH DIFFERENTIAL/PLATELET
BASOS ABS: 0 10*3/uL (ref 0.0–0.1)
BASOS PCT: 1 %
EOS ABS: 0.2 10*3/uL (ref 0.0–0.7)
Eosinophils Relative: 3 %
HEMATOCRIT: 24.6 % — AB (ref 36.0–46.0)
HEMOGLOBIN: 8.7 g/dL — AB (ref 12.0–15.0)
Lymphocytes Relative: 27 %
Lymphs Abs: 1.2 10*3/uL (ref 0.7–4.0)
MCH: 30 pg (ref 26.0–34.0)
MCHC: 35.4 g/dL (ref 30.0–36.0)
MCV: 84.8 fL (ref 78.0–100.0)
MONOS PCT: 9 %
Monocytes Absolute: 0.4 10*3/uL (ref 0.1–1.0)
NEUTROS PCT: 60 %
Neutro Abs: 2.6 10*3/uL (ref 1.7–7.7)
Platelets: 114 10*3/uL — ABNORMAL LOW (ref 150–400)
RBC: 2.9 MIL/uL — AB (ref 3.87–5.11)
RDW: 16 % — ABNORMAL HIGH (ref 11.5–15.5)
WBC: 4.4 10*3/uL (ref 4.0–10.5)

## 2017-02-06 MED ORDER — PANTOPRAZOLE SODIUM 40 MG PO TBEC
40.0000 mg | DELAYED_RELEASE_TABLET | Freq: Every day | ORAL | Status: DC
  Administered 2017-02-07: 40 mg via ORAL
  Filled 2017-02-06: qty 1

## 2017-02-06 NOTE — Evaluation (Signed)
Physical Therapy Evaluation Patient Details Name: [name] MRN: [id number] DOB: Dec 27, 1927 Today's Date: 02/06/2017   History of Present Illness  83 year old female who presented with bloody stools. Patient is known to have hypertension, Parkinson's disease, non-Hodgkin's lymphoma.   Clinical Impression  Pt admitted with above diagnosis. Pt currently with functional limitations due to the deficits listed below (see PT Problem List).  Pt will benefit from skilled PT to increase their independence and safety with mobility to allow discharge to the venue listed below.  Pt presented with some confusion, but husband reports this is not new.  She was able to ambulate short distances in room with incontinence of stool.  She fatigued quickly and was unable to don socks, which she normally does.  Recommend HHPT, HHOT, and 24 hour S.    Follow Up Recommendations Home health PT;Supervision/Assistance - 24 hour (and HHOT)    Equipment Recommendations  None recommended by PT    Recommendations for Other Services       Precautions / Restrictions Precautions Precautions: Fall Restrictions Weight Bearing Restrictions: No      Mobility  Bed Mobility Overal bed mobility: Needs Assistance Bed Mobility: Supine to Sit     Supine to sit: HOB elevated;Min guard     General bed mobility comments: S to come to EOB with cues on scooting forward  Transfers Overall transfer level: Needs assistance Equipment used: Rolling walker (2 wheeled) Transfers: Sit to/from Stand Sit to Stand: Min guard         General transfer comment: min/guard for steadying and cues for hand placement. Pt needing reminders to not get off toilet by herself.  Ambulation/Gait Ambulation/Gait assistance: Min guard;Min assist Ambulation Distance (Feet): 12 Feet (x 2) Assistive device: Rolling walker (2 wheeled) Gait Pattern/deviations: Decreased step length - right;Decreased step length - left;Trunk flexed      General Gait Details: Pt incontinent of stool on the way to the bathroom, no blood noted.  MIN A at times in bathroom due to tight space, RW, and IV pole.  Stairs            Wheelchair Mobility    Modified Rankin (Stroke Patients Only)       Balance Overall balance assessment: Needs assistance   Sitting balance-Leahy Scale: Fair     Standing balance support: During functional activity Standing balance-Leahy Scale: Poor                               Pertinent Vitals/Pain Pain Assessment: No/denies pain    Home Living Family/patient expects to be discharged to:: Private residence Living Arrangements: Spouse/significant other Available Help at Discharge: Family Type of Home: House Home Access: Stairs to enter Entrance Stairs-Rails: Psychiatric nurse of Steps: 3 and 2 Home Layout: One level Home Equipment: [place] - 4 wheels;Cane - single point;Shower seat      Prior Function Level of Independence: Independent with assistive device(s)         Comments: at home amb without AD and in community uses rollator or cane     Hand Dominance        Extremity/Trunk Assessment   Upper Extremity Assessment Upper Extremity Assessment: Overall WFL for tasks assessed    Lower Extremity Assessment Lower Extremity Assessment: Overall WFL for tasks assessed       Communication   Communication: HOH  Cognition Arousal/Alertness: Awake/alert Behavior During Therapy: WFL for tasks assessed/performed;Impulsive Overall Cognitive Status: History of  cognitive impairments - at baseline                                        General Comments General comments (skin integrity, edema, etc.): Pt unable to donn socks.    Exercises     Assessment/Plan    PT Assessment Patient needs continued PT services  PT Problem List Decreased strength;Decreased activity tolerance;Decreased balance;Decreased mobility;Decreased knowledge of  use of DME;Decreased cognition       PT Treatment Interventions Gait training;Functional mobility training;Therapeutic activities;Therapeutic exercise;Balance training;Stair training;DME instruction    PT Goals (Current goals can be found in the Care Plan section)  Acute Rehab PT Goals Patient Stated Goal: go home PT Goal Formulation: With patient/family Time For Goal Achievement: 02/20/17 Potential to Achieve Goals: Good    Frequency Min 3X/week   Barriers to discharge        Co-evaluation               AM-PAC PT "6 Clicks" Daily Activity  Outcome Measure Difficulty turning over in bed (including adjusting bedclothes, sheets and blankets)?: A Little Difficulty moving from lying on back to sitting on the side of the bed? : A Lot Difficulty sitting down on and standing up from a chair with arms (e.g., wheelchair, bedside commode, etc,.)?: Total Help needed moving to and from a bed to chair (including a wheelchair)?: A Little Help needed walking in hospital room?: A Little Help needed climbing 3-5 steps with a railing? : A Little 6 Click Score: 15    End of Session Equipment Utilized During Treatment: Gait belt Activity Tolerance: Patient limited by fatigue Patient left: in chair;with call bell/phone within reach;with chair alarm set;with family/visitor present Nurse Communication: Mobility status PT Visit Diagnosis: Unsteadiness on feet (R26.81);Difficulty in walking, not elsewhere classified (R26.2)    Time: 1127-1202 PT Time Calculation (min) (ACUTE ONLY): 35 min   Charges:   PT Evaluation $PT Eval Moderate Complexity: 1 Procedure PT Treatments $Gait Training: 8-22 mins   PT G Codes:        Lynk Marti L. Tamala Julian, [place] Pager [phone] 02/06/2017   Galen Manila 02/06/2017, 12:54 PM

## 2017-02-06 NOTE — Progress Notes (Signed)
Bed alarms sounding off-found pt standing at the side of her bed - crawled out over side rails, somewhat confused not sure where she is, assisted her to & from the toilet - returned her to bed , with bed alarms on

## 2017-02-06 NOTE — Progress Notes (Signed)
[name] 10:03 AM  Subjective: Patient seen and examined and hospital computer chart reviewed and case discussed with multiple family members and all their questions were asked and she does not have any signs of further bleeding and no other complaints  Objective: Vital signs stable afebrile abdomen is soft nontender hemoglobin stable  Assessment: Presumed diverticular bleeding  Plan: If no signs of further bleeding today may advance diet for her evening meal and if stable in a.m. without signs of bleeding probably could go home if no other medical issues and we are happy to see back in the office in a few weeks for recheck and we discussed questionable colonoscopy CT or bleeding scan if symptoms continue and we also discussed avoiding aspirin nonsteroidals and using Tylenol only at home  [facility] E  Pager [phone] After 5PM or if no answer call [phone]

## 2017-02-06 NOTE — Progress Notes (Signed)
PROGRESS NOTE    [name]  OEV:[id number] DOB: 02-21-1928 DOA: 02/03/2017 PCP: [name], MD    Brief Narrative:  83 year old female who presented with bloody stools. Patient is known to have hypertension, Parkinson's disease, non-Hodgkin's lymphoma. She developed sudden onset of hematochezia, no associated dyspnea or lightheadedness. Patient has been taking NSAIDs at home. On initial physical examination blood pressure 156/73, heart rate 81, respirations 16, temperature 98., oxygen saturation 98%. Moist mucous membranes, lungs clear to auscultation bilaterally, no wheezing rales or rhonchi, heart S1-S2 present and rhythmic, positive systolic murmur 3 out of 6, radiated into the carotids, her abdomen was soft nontender no organomegaly or peritoneal signs, no lower extremity edema. Sodium 137, potassium 4.0, chloride 100, bicarbonate 27, glucose 133, BUN 37, creatinine 1.27, white count 4.1, hemoglobin 9.7, hematocrit 28.1, platelets 139. Positive fecal occult blood.   Patient was admitted to the hospital with the working diagnosis of gastrointestinal bleeding, upper versus lower in origin, complicated by acute kidney injury and thrombocytopenia. Patient continued to bleed overnight, her hemoglobin dropped to 6.6, she was transferred to the stepdown unit, received 3 units of packed red blood cells, gastroenterology has been consulted for upper endoscopy.    Assessment & Plan:   Active Problems:   GIB (gastrointestinal bleeding)  1. Gastrointestinal bleeding, assumed to be lower GI bleed, suspected diverticulosis. Hb and hct have remained stable, no signs of continuous bleeding, will continue to follow on H&H in am. Will change pantoprazole to daily po. Continue miralax. Advance diet to regular and allow patient to ambulate. Further workup as outpatient. Will plan to discharge in am.   2. Acute blood loss anemia. Status post 3 units of PRBC, no further bleeding.   3. Acute  kidney injury, resolved. Patient tolerating po well, will hold on further IV fluids, renal function has normalized per cr. K at 4,0 and serum bicarbonate at 19 with NA at 141.   4. Thrombocytopenia. Continue to improve with counts up to 114.   5. Hypertension. As needed hydralazine, for blood pressure control. Systolic blood pressure 829 to 150. Not on antihypertensive agents at home.    6. Parkinsonism. Continue on sinement, follow on physical therapy evaluation.   7. Depression. On sertraline per home regimen. No agitation.    DVT prophylaxis:scd Code Status:full  Family Communication:I spoke with patient's family at the bedside and all questions were addressed.  Disposition Plan:home    Consultants:  Gastroenterology   Procedures:   Antimicrobials:   Subjective: Patient feeling better, no nausea or vomiting, tolerating clears well, no dyspnea. Positive bowel movement, dark in color but no frank hematochezia or melena.   Objective: Vitals:   02/05/17 0900 02/05/17 1026 02/05/17 2036 02/06/17 0546  BP: (!) 104/44 (!) 121/51 (!) 170/99 (!) 143/89  Pulse: 87 85 91 95  Resp: (!) 23 (!) 23 20 20   Temp:  (!) 97.2 F (36.2 C) 98.9 F (37.2 C) 98.6 F (37 C)  TempSrc:  Oral Oral Oral  SpO2: 98% 100% 98% 98%  Weight:    63.2 kg (139 lb 6.4 oz)  Height:        Intake/Output Summary (Last 24 hours) at 02/06/17 1022 Last data filed at 02/06/17 0835  Gross per 24 hour  Intake              120 ml  Output                0 ml  Net  120 ml   Filed Weights   02/04/17 1013 02/05/17 0300 02/06/17 0546  Weight: 57.3 kg (126 lb 5.2 oz) 61.1 kg (134 lb 11.2 oz) 63.2 kg (139 lb 6.4 oz)    Examination:  General exam: No dyspnea or chest pain E ENT. No pallor or icterus, oral mucosa moist.  Respiratory system: Clear to auscultation. Respiratory effort normal. No wheezing, rales or rhonchi.  Cardiovascular system: S1 & S2 heard, RRR. No JVD, murmurs,  rubs, gallops or clicks. No pedal edema. Gastrointestinal system: Abdomen is nondistended, soft and nontender. No organomegaly or masses felt. Normal bowel sounds heard. Central nervous system: Alert and oriented. No focal neurological deficits. Extremities: Symmetric 5 x 5 power. Skin: No rashes, lesions or ulcers    Data Reviewed: I have personally reviewed following labs and imaging studies  CBC:  Recent Labs Lab 02/03/17 1757 02/03/17 2114 02/04/17 0628 02/04/17 1333 02/04/17 1820 02/05/17 0001 02/05/17 0627 02/06/17 0548  WBC 3.7* 4.3 3.0*  --   --  3.2*  --  4.4  NEUTROABS 1.9  --   --   --   --  1.7  --  2.6  HGB 8.0* 6.6* 8.8* 8.8* 8.2* 8.2* 8.0* 8.7*  HCT 23.3* 19.5* 24.8* 24.9* 23.0* 23.2* 22.6* 24.6*  MCV 85.3 86.3 83.8  --   --  82.9  --  84.8  PLT 127* 135* 89*  --   --  91*  --  542*   Basic Metabolic Panel:  Recent Labs Lab 02/03/17 0216 02/04/17 0628 02/05/17 0001 02/06/17 0548  NA 137 139 139 141  K 4.0 3.9 3.8 4.0  CL 100* 111 113* 113*  CO2 27 22 21* 19*  GLUCOSE 133* 99 98 102*  BUN 37* 26* 15 7  CREATININE 1.27* 0.95 0.91 0.91  CALCIUM 8.6* 6.7* 7.0* 7.9*   GFR: Estimated Creatinine Clearance: 39 mL/min (by C-G formula based on SCr of 0.91 mg/dL). Liver Function Tests:  Recent Labs Lab 02/03/17 0216  AST 32  ALT 12*  ALKPHOS 66  BILITOT 1.1  PROT 6.0*  ALBUMIN 3.5   No results for input(s): LIPASE, AMYLASE in the last 168 hours. No results for input(s): AMMONIA in the last 168 hours. Coagulation Profile:  Recent Labs Lab 02/03/17 0335  INR 1.12   Cardiac Enzymes: No results for input(s): CKTOTAL, CKMB, CKMBINDEX, TROPONINI in the last 168 hours. BNP (last 3 results) No results for input(s): PROBNP in the last 8760 hours. HbA1C: No results for input(s): HGBA1C in the last 72 hours. CBG: No results for input(s): GLUCAP in the last 168 hours. Lipid Profile: No results for input(s): CHOL, HDL, LDLCALC, TRIG, CHOLHDL,  LDLDIRECT in the last 72 hours. Thyroid Function Tests: No results for input(s): TSH, T4TOTAL, FREET4, T3FREE, THYROIDAB in the last 72 hours. Anemia Panel: No results for input(s): VITAMINB12, FOLATE, FERRITIN, TIBC, IRON, RETICCTPCT in the last 72 hours. Sepsis Labs: No results for input(s): PROCALCITON, LATICACIDVEN in the last 168 hours.  Recent Results (from the past 240 hour(s))  MRSA PCR Screening     Status: None   Collection Time: 02/03/17  6:07 PM  Result Value Ref Range Status   MRSA by PCR NEGATIVE NEGATIVE Final    Comment:        The GeneXpert MRSA Assay (FDA approved for NASAL specimens only), is one component of a comprehensive MRSA colonization surveillance program. It is not intended to diagnose MRSA infection nor to guide or monitor treatment for MRSA infections.  Radiology Studies: No results found.      Scheduled Meds: . acyclovir  400 mg Oral BID  . calcium-vitamin D  1 tablet Oral QHS  . carbidopa-levodopa  1 tablet Oral BID WC  . carbidopa-levodopa  2 tablet Oral QHS  . cholecalciferol  1,000 Units Oral QPM  . entacapone  200 mg Oral TID  . loratadine  10 mg Oral Daily  . mirabegron ER  50 mg Oral Q1200  . mometasone-formoterol  2 puff Inhalation BID  . multivitamin with minerals  1 tablet Oral Q breakfast  . [START ON 02/07/2017] pantoprazole  40 mg Oral Daily  . polyethylene glycol  17 g Oral BID  . sertraline  25 mg Oral QHS  . sodium chloride flush  3 mL Intravenous Q12H   Continuous Infusions:   LOS: 2 days        Mauricio Gerome Apley, MD [facility] Pager [phone]  If 7PM-7AM, please contact night-coverage [email or web address] Password [facility] 02/06/2017, 10:22 AM

## 2017-02-07 DIAGNOSIS — F0281 Dementia in other diseases classified elsewhere with behavioral disturbance: Secondary | ICD-10-CM

## 2017-02-07 DIAGNOSIS — D500 Iron deficiency anemia secondary to blood loss (chronic): Secondary | ICD-10-CM

## 2017-02-07 LAB — BPAM RBC
BLOOD PRODUCT EXPIRATION DATE: 201807282359
Blood Product Expiration Date: 201807282359
Blood Product Expiration Date: 201807282359
Blood Product Expiration Date: 201807292359
Blood Product Expiration Date: 201808022359
ISSUE DATE / TIME: 201807112121
ISSUE DATE / TIME: 201807112342
ISSUE DATE / TIME: 201807120122
UNIT TYPE AND RH: 5100
Unit Type and Rh: 5100
Unit Type and Rh: 5100
Unit Type and Rh: 5100
Unit Type and Rh: 5100

## 2017-02-07 LAB — TYPE AND SCREEN
ABO/RH(D): O POS
ANTIBODY SCREEN: NEGATIVE
UNIT DIVISION: 0
UNIT DIVISION: 0
UNIT DIVISION: 0
UNIT DIVISION: 0
Unit division: 0

## 2017-02-07 LAB — HEMOGLOBIN AND HEMATOCRIT, BLOOD
HEMATOCRIT: 23.2 % — AB (ref 36.0–46.0)
Hemoglobin: 8 g/dL — ABNORMAL LOW (ref 12.0–15.0)

## 2017-02-07 MED ORDER — POLYETHYLENE GLYCOL 3350 17 G PO PACK: 17.0000 g | PACK | Freq: Every day | ORAL | 0 refills | Status: AC

## 2017-02-07 NOTE — Progress Notes (Signed)
Patient discharged to home with family, discharge instructions reviewed with patient & husband who verbalized understanding. New RX sent to pharmacy.

## 2017-02-07 NOTE — Progress Notes (Signed)
[name] 7:47 AM  Subjective: Patient without signs of bleeding no complaints tolerated soft diet last night checked office computer chart last colonoscopy May 2011 which did showed diverticuli on the left side  Objective: Vital signs stable afebrile no acute distress abdomen is soft nontender hemoglobin stable from 2 days ago  Assessment: Probable diverticular bleeding  Plan: Consider transfusion before discharge just to be sure and happy to see back when necessary and please call us if we can be of any further assistance with this hospital stay  [facility] E  Pager [phone] After 5PM or if no answer call [phone]

## 2017-02-07 NOTE — Discharge Summary (Signed)
Physician Discharge Summary  [name] LDJ:[id number] DOB: September 24, 1927 DOA: 02/03/2017  PCP: [name], MD  Admit date: 02/03/2017 Discharge date: 02/07/2017  Admitted From: Home Disposition:  Home   Recommendations for Outpatient Follow-up:  1. Follow up with Dr. Kenton Kingfisher in 1-weeks 2. Patient will need Iron panel in 3 months 3. Placed on B[place]l: Yes Equipment/Devices:  Discharge Condition: Stable  CODE STATUS: Full  Diet recommendation: Heart Healthy  Brief/Interim Summary: 83 year old female who presented with bloody stools. Patient is known to have hypertension, Parkinson's disease, and history of non-Hodgkin's lymphoma. She developed sudden onset of hematochezia, no associated dyspnea or lightheadedness. Patient has been taking NSAIDs at home. On initial physical examination blood pressure 156/73, heart rate 81, respirations 16, temperature 98., oxygen saturation 98%. Moist mucous membranes, lungs clear to auscultation bilaterally, no wheezing rales or rhonchi, heart S1-S2 present and rhythmic, positive systolic murmur 3 out of 6, radiated into the carotids, her abdomen was soft nontender no organomegaly or peritoneal signs, no lower extremity edema. Sodium 137, potassium 4.0, chloride 100, bicarbonate 27, glucose 133, BUN 37, creatinine 1.27, white count 4.1, hemoglobin 9.7, hematocrit 28.1, platelets 139. Positive fecal occult blood.   Patient was admitted to the hospital with the working diagnosis of gastrointestinal bleeding, upper versus lower in origin, complicated by acute kidney injury and thrombocytopenia.  1. Gastrointestinal bleeding, presumed to be lower GI in origin, diverticulosis/arteriovenous malformation. Patient was admitted to the medical unit, she had ongoing bleed during the first 24 hours of hospitalization, her hemoglobin dropped 6.6, transferred to the stepdown unit, she did required 3 units of packed red blood cells to be transfused  reaching a stable hemoglobin of 8. Gastroenterology was consulted, patient underwent upper endoscopy which showed a small hiatal hernia, normal stomach, normal duodenum, first portion of the duodenum and second portion of the duodenum. No bleeding source was identified. Patient was transferred to medical ward, her diet was advanced with good toleration. No further evidence of active bleeding. Patient had a history of diverticulosis and it has been assumed that she had a diverticular bleed, also note that patient has aortic stenosis, that can provoke arteriovenous malformations and lower GI bleed. Will recommend close follow-up as an outpatient. Stop meloxicam.   2. Acute blood loss anemia. Lower GI in origin, patient tolerated well 3 units of packed red blood cells. Will recommend to check iron panels in 3 months. Patient may be candidate for iron supplementation. Discharge hemoglobin 8 with hematocrit 23.2.  3. Acute kidney injury. It was presumed to be prerenal due to her acute anemia. Patient received IV fluids and blood transfusion, 3 packed red blood cells. Her kidney function improved her discharge creatinine 0.91, sodium 140, potassium 4.0, chloride 113, and serum bicarbonate 19.   4. Thrombocytopenia/ non-Hodgkin's lymphoma. Patient has history of non-Hodgkin's lymphoma, her platelet count dropped to 89, likely reactive, her discharge platelets 114. Recommend follow-up as an outpatient with hematology as scheduled.   5. History of hypertension. He should have blood pressure monitoring during hospitalization, she received IV hydralazine as needed, discharge blood pressure 158/65. Patient off antihypertensive agents at home.   6. Parkinson's disease. Carbidopa/levodopa was continued with no major complications.  7. Depression. Sertraline will be continued.       Discharge Diagnoses:  Active Problems:   GIB (gastrointestinal bleeding)    Discharge Instructions   Allergies as of  02/07/2017   No Known Allergies     Medication List    STOP taking  these medications   meloxicam 15 MG tablet Commonly known as:  MOBIC     TAKE these medications   acetaminophen 325 MG tablet Commonly known as:  TYLENOL Take 650 mg by mouth every 6 (six) hours as needed for mild pain, moderate pain, fever or headache.   acyclovir 400 MG tablet Commonly known as:  ZOVIRAX Take 400 mg by mouth 2 (two) times daily.   bisacodyl 5 MG EC tablet Commonly known as:  DULCOLAX Take 5 mg by mouth daily as needed for moderate constipation.   CALCIUM 600-D 600-400 MG-UNIT Tabs Generic drug:  Calcium Carbonate-Vitamin D3 Take 1 tablet by mouth at bedtime.   carbidopa-levodopa 25-100 MG tablet Commonly known as:  SINEMET IR 1 tablet in the morning by mouth, 1 in the afternoon by mouth, 2 in the evening by mouth. What changed:  how much to take  how to take this  when to take this  additional instructions   cetirizine 10 MG tablet Commonly known as:  ZYRTEC Take 10 mg by mouth daily at 12 noon.   cholecalciferol 1000 units tablet Commonly known as:  VITAMIN D Take 1,000 Units by mouth every evening.   entacapone 200 MG tablet Commonly known as:  COMTAN Take 1 tablet (200 mg total) by mouth 3 (three) times daily. TAKE 1 TABLET (200 MG TOTAL) BY MOUTH 3 (THREE) TIMES DAILY - take with Sinemet What changed:  additional instructions   Fluticasone-Salmeterol 250-50 MCG/DOSE Aepb Commonly known as:  ADVAIR Inhale 1 puff into the lungs 2 (two) times daily.   multivitamin with minerals tablet Take 1 tablet by mouth daily with breakfast.   MYRBETRIQ 50 MG Tb24 tablet Generic drug:  mirabegron ER Take 50 mg by mouth daily at 12 noon.   polyethylene glycol packet Commonly known as:  MIRALAX / GLYCOLAX Take 17 g by mouth daily.   sertraline 25 MG tablet Commonly known as:  ZOLOFT Take 25 mg by mouth at bedtime.       No Known  Allergies  Consultations:  Gastroenterology    Procedures/Studies:  No results found.    Subjective: Patient feeling better, no nausea or vomiting, tolerating po well, generalized deconditioning and generalized weakness, no further bleeding.   Discharge Exam: Vitals:   02/06/17 2111 02/07/17 0548  BP: (!) 158/65 (!) 158/70  Pulse: 80 80  Resp:    Temp: 98.5 F (36.9 C) 97.6 F (36.4 C)   Vitals:   02/06/17 2111 02/07/17 0500 02/07/17 0548 02/07/17 0750  BP: (!) 158/65  (!) 158/70   Pulse: 80  80   Resp:      Temp: 98.5 F (36.9 C)  97.6 F (36.4 C)   TempSrc: Oral  Oral   SpO2: 100%  98% 96%  Weight:  58.7 kg (129 lb 4.8 oz) 63.2 kg (139 lb 6.4 oz)   Height:        General: Pt is alert, awake, not in acute distress E ENT: mild pallor, no icterus, oral mucosa moist.  Cardiovascular: RRR, S1/S2 +, no rubs, no gallops Respiratory: CTA bilaterally, no wheezing, no rhonchi Abdominal: Soft, NT, ND, bowel sounds + Extremities: no edema, no cyanosis    The results of significant diagnostics from this hospitalization (including imaging, microbiology, ancillary and laboratory) are listed below for reference.     Microbiology: Recent Results (from the past 240 hour(s))  MRSA PCR Screening     Status: None   Collection Time: 02/03/17  6:07 PM  Result  Value Ref Range Status   MRSA by PCR NEGATIVE NEGATIVE Final    Comment:        The GeneXpert MRSA Assay (FDA approved for NASAL specimens only), is one component of a comprehensive MRSA colonization surveillance program. It is not intended to diagnose MRSA infection nor to guide or monitor treatment for MRSA infections.      Labs: BNP (last 3 results) No results for input(s): BNP in the last 8760 hours. Basic Metabolic Panel:  Recent Labs Lab 02/03/17 0216 02/04/17 0628 02/05/17 0001 02/06/17 0548  NA 137 139 139 141  K 4.0 3.9 3.8 4.0  CL 100* 111 113* 113*  CO2 27 22 21* 19*  GLUCOSE 133* 99 98  102*  BUN 37* 26* 15 7  CREATININE 1.27* 0.95 0.91 0.91  CALCIUM 8.6* 6.7* 7.0* 7.9*   Liver Function Tests:  Recent Labs Lab 02/03/17 0216  AST 32  ALT 12*  ALKPHOS 66  BILITOT 1.1  PROT 6.0*  ALBUMIN 3.5   No results for input(s): LIPASE, AMYLASE in the last 168 hours. No results for input(s): AMMONIA in the last 168 hours. CBC:  Recent Labs Lab 02/03/17 1757 02/03/17 2114 02/04/17 0628  02/04/17 1820 02/05/17 0001 02/05/17 0627 02/06/17 0548 02/07/17 0641  WBC 3.7* 4.3 3.0*  --   --  3.2*  --  4.4  --   NEUTROABS 1.9  --   --   --   --  1.7  --  2.6  --   HGB 8.0* 6.6* 8.8*  < > 8.2* 8.2* 8.0* 8.7* 8.0*  HCT 23.3* 19.5* 24.8*  < > 23.0* 23.2* 22.6* 24.6* 23.2*  MCV 85.3 86.3 83.8  --   --  82.9  --  84.8  --   PLT 127* 135* 89*  --   --  91*  --  114*  --   < > = values in this interval not displayed. Cardiac Enzymes: No results for input(s): CKTOTAL, CKMB, CKMBINDEX, TROPONINI in the last 168 hours. BNP: Invalid input(s): POCBNP CBG: No results for input(s): GLUCAP in the last 168 hours. D-Dimer No results for input(s): DDIMER in the last 72 hours. Hgb A1c No results for input(s): HGBA1C in the last 72 hours. Lipid Profile No results for input(s): CHOL, HDL, LDLCALC, TRIG, CHOLHDL, LDLDIRECT in the last 72 hours. Thyroid function studies No results for input(s): TSH, T4TOTAL, T3FREE, THYROIDAB in the last 72 hours.  Invalid input(s): FREET3 Anemia work up No results for input(s): VITAMINB12, FOLATE, FERRITIN, TIBC, IRON, RETICCTPCT in the last 72 hours. Urinalysis    Component Value Date/Time   COLORURINE YELLOW 02/05/2017 0056   APPEARANCEUR CLEAR 02/05/2017 0056   LABSPEC 1.008 02/05/2017 0056   LABSPEC 1.015 06/05/2008 0907   PHURINE 5.0 02/05/2017 0056   GLUCOSEU NEGATIVE 02/05/2017 0056   HGBUR SMALL (A) 02/05/2017 0056   BILIRUBINUR NEGATIVE 02/05/2017 0056   BILIRUBINUR Negative 06/05/2008 0907   KETONESUR NEGATIVE 02/05/2017 0056    PROTEINUR NEGATIVE 02/05/2017 0056   UROBILINOGEN 0.2 09/20/2012 0117   NITRITE NEGATIVE 02/05/2017 0056   LEUKOCYTESUR NEGATIVE 02/05/2017 0056   LEUKOCYTESUR Negative 06/05/2008 0907   Sepsis Labs Invalid input(s): PROCALCITONIN,  WBC,  LACTICIDVEN Microbiology Recent Results (from the past 240 hour(s))  MRSA PCR Screening     Status: None   Collection Time: 02/03/17  6:07 PM  Result Value Ref Range Status   MRSA by PCR NEGATIVE NEGATIVE Final    Comment:  The GeneXpert MRSA Assay (FDA approved for NASAL specimens only), is one component of a comprehensive MRSA colonization surveillance program. It is not intended to diagnose MRSA infection nor to guide or monitor treatment for MRSA infections.      Time coordinating discharge: 45 minutes  SIGNED:   Tawni Millers, MD  [facility] 02/07/2017, 9:38 AM Pager   If 7PM-7AM, please contact night-coverage [email or web address] Password [email or web address]

## 2017-02-11 DIAGNOSIS — K922 Gastrointestinal hemorrhage, unspecified: Secondary | ICD-10-CM | POA: Diagnosis not present

## 2017-02-11 DIAGNOSIS — D500 Iron deficiency anemia secondary to blood loss (chronic): Secondary | ICD-10-CM | POA: Diagnosis not present

## 2017-03-14 IMAGING — CR DG LUMBAR SPINE COMPLETE 4+V
5 series · 5 of 5 positions shown · non-contrast
Comparison: Coronal and sagittal CT images from a scan dated November 29, 2014

CLINICAL DATA: Six months of low back pain without known injury or
radicular symptoms. History of breast malignancy.

EXAM:
LUMBAR SPINE - COMPLETE 4+ VIEW

[w lumbar spine ap]
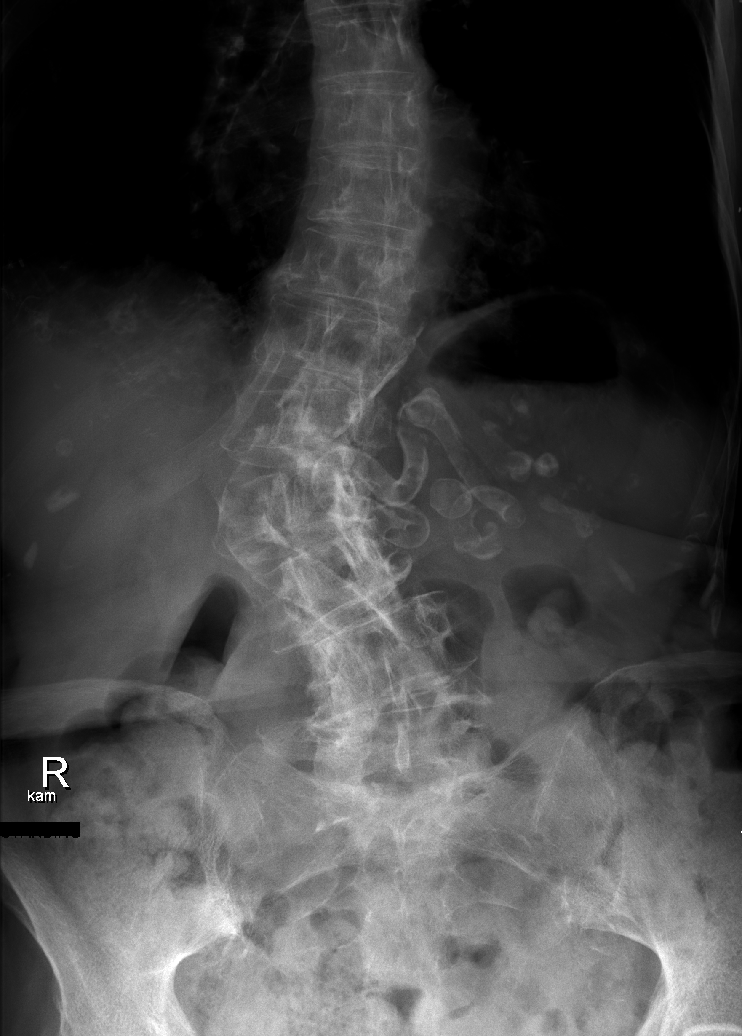

[w lumbar spine obl (1 of 2)]
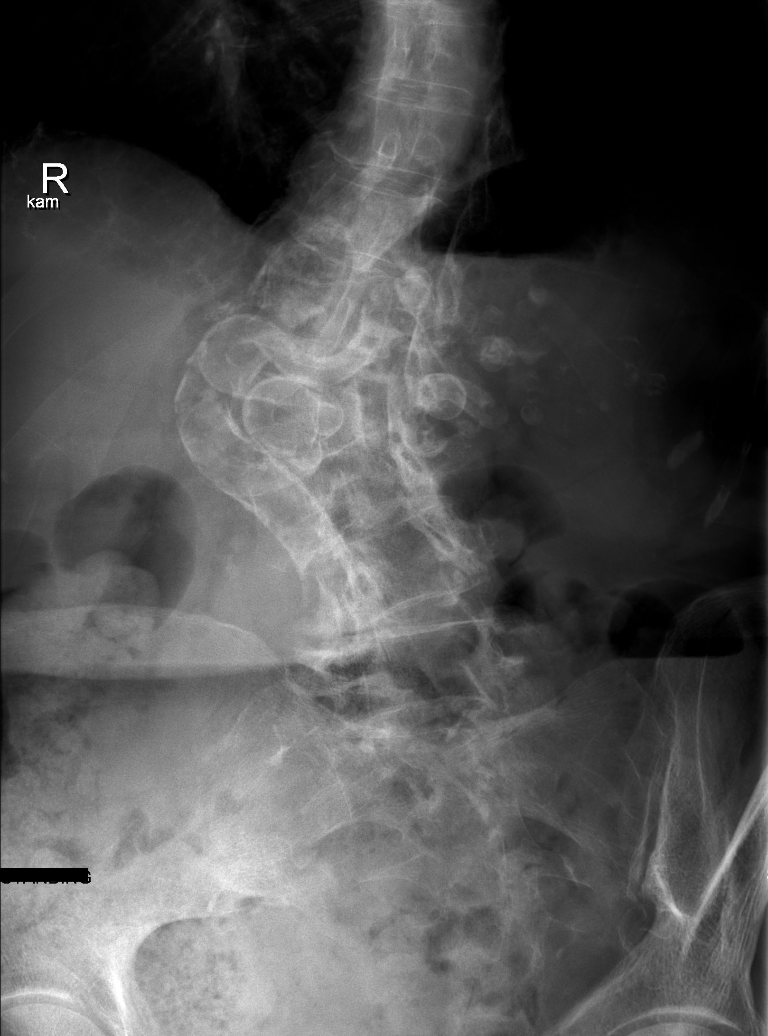

[w lumbar spine obl (2 of 2)]
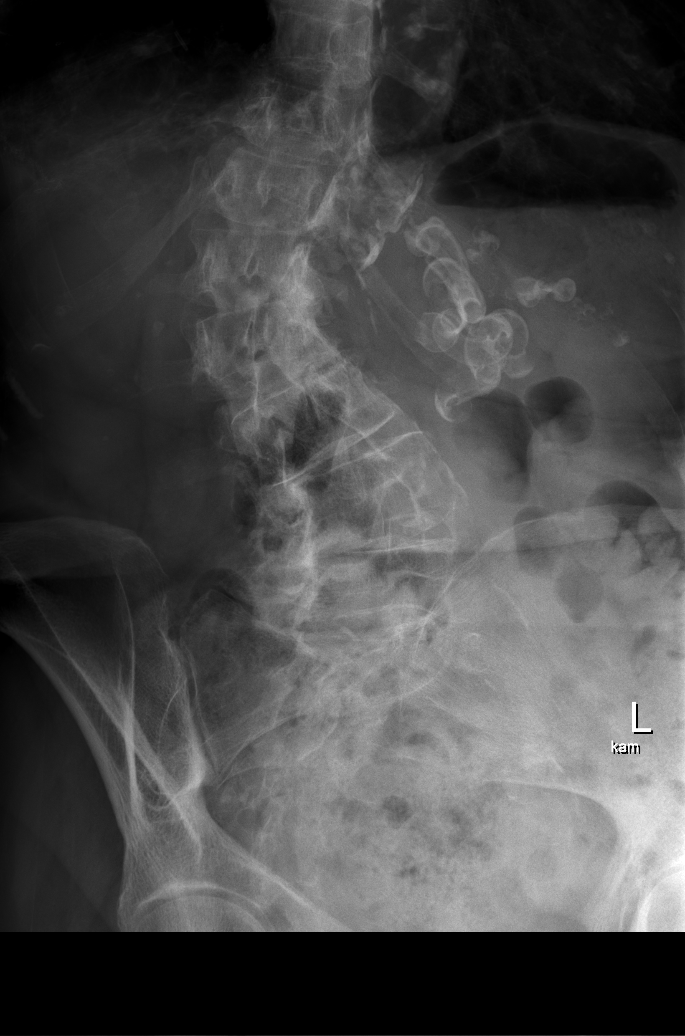

[w lumbar spine lat]
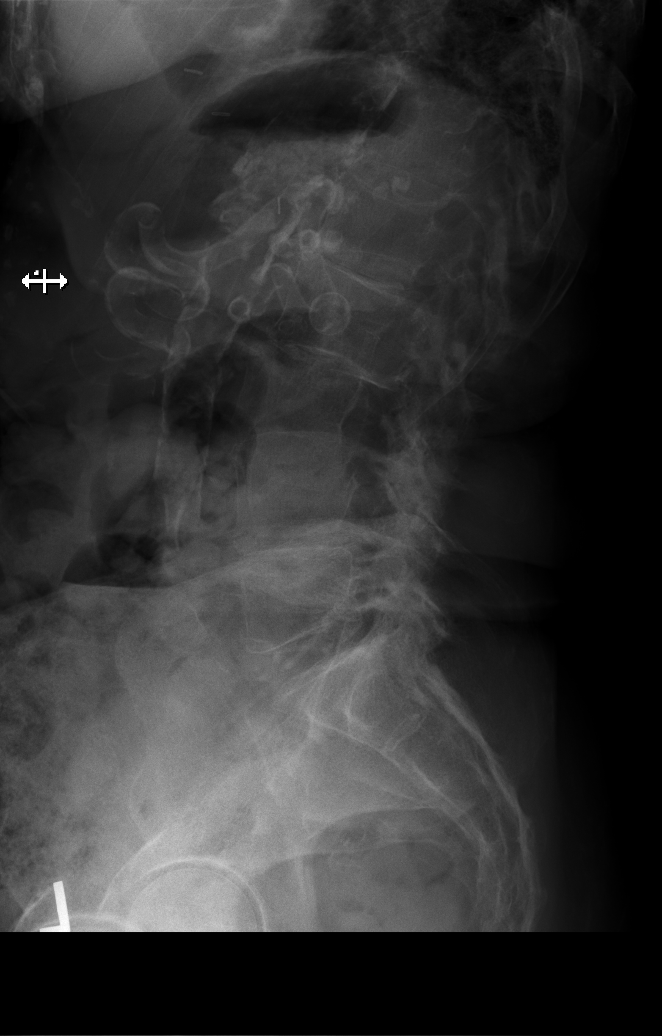

[w lumbar l-5 s-1 spot]
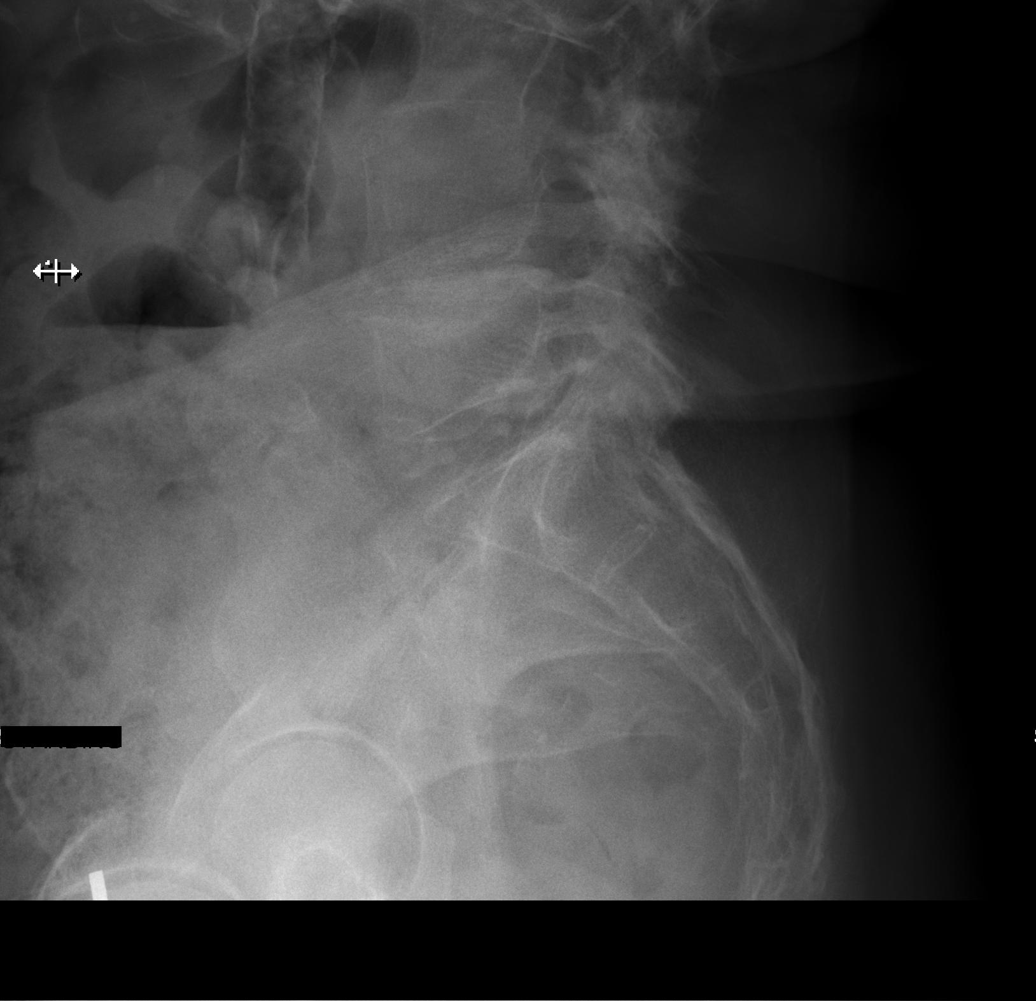

[5 of 5 positions shown; findings below may reference images not displayed]

FINDINGS: There is moderate to severe dextroscoliosis centered at L2. The
vertebral bodies are preserved in height. There is degenerative disc
space narrowing at multiple lumbar levels. There is no
spondylolisthesis. The pedicles appear intact where visualized.

There is dense calcification in the wall of the abdominal aorta,
common iliac vessels, and splenic artery. The bowel gas pattern is
within the limits of normal.
IMPRESSION: Moderate to severe dextroscoliosis of the lumbar spine. No acute
compression fracture nor other acute bony abnormality.

Aortoiliac and splenic artery atherosclerosis.

## 2017-04-04 NOTE — Progress Notes (Signed)
[name] was seen today in the movement disorders clinic for neurologic consultation at the request of Dr. Betsy Coder.    The consultation is for the evaluation of ataxia and tremor.  The patient has been a previous pt of Dr. Erling Cruz.  Dr. Erling Cruz dx the patient with parkinsonism and ET.  This is an 83 y/o female with a complex medical hx of both non Hodgkins lymphoma require chemotherapy and breast CA, s/p bilateral mastectomy.  She has a long hx of tremor.  I reviewed Dr. Bernardo Heater notes that were made available to me.  It appears that she presented with right hand resting tremor in 2009, without associated rigidity or bradykinesia.  There were no known medications causing the problem and it appears that she was initially dx with ET and later transitioned the dx to parkinsonism.  The patient reports no tremor in the L hand.  Her husband reports a little tremor in the R leg.  The patient reports that tremor is most significant with relaxing and she has no tremor if her hands are preoccupied.    She has never been on PD medication or been to PT until yesterday.  03/16/13 update:  The pt presents today with her husband.  She has not been seen since February, 2014 at which time I dx her with PD.  She was started on levodopa 25/100 three times per day.  She did not follow up sooner as she had bronchitis and pneumonia for the last 2 months and is just now recovering.  She did go to PT and liked it but would like to go back.  She is doing well on the levodopa.  Her husband rarely notice tremor any longer.  06/28/13 update:    The pt today presents for f/u with her husband who supplements the hx.  She is on carbidopa/levodopa 25/100 three times per day.  She is doing well but notices R arm tremor now that had gone away.  Rarely she will note tremor in the L hand which she had not noted previously.  She does have some cramping of the feet.  She is unsure if this is associated with wearing off.  She did have some  of this while in my office, and it was time for her to redose the carbidopa/levodopa.  She is now attending therapy, and is enjoying and finding benefit from it.  08/08/12 update:  The patient presents today with her husband, who supplements the history.  The patient has Parkinson's disease and is currently on carbidopa/levodopa 25/100, one tablet 3 times per day.  Last visit, she was describing some cramping in her feet and I was trying to figure out whether or not this was an "off" phenomenon.  After paying attention, the patient called and said that she thought it was and we added entacapone.  The cramping in the feet is better but she is now having a stabbing and shooting pain down the back of the leg and to the lateral side of the lower leg that stops at the ankle.  It has stopped her from exercising.  She sometimes has back pain.  This is fairly inconsistent.  The leg pain seems to start about 4 days after she finished rehabilitation, which did seem to help.  She is not sure if the pain is related to the rehabilitation, however.  10/03/13 update:  The patient presents today with her husband, who supplements the history.  The patient is currently on carbidopa/levodopa 25/100, one  tablet 3 times per day.  She is also on Comtan 200 mg, one tablet 3 times per day.  From a Parkinson's standpoint, she has been doing well.  No falls.  No hallucinations.  Her husband rarely sees tremor, and that is primarily when she is in bed at night and he will feel it rather than see it.  Last visit, she was complaining of back pain that was likely sciatica.  I ordered an EMG but they ultimately canceled that.  I tried her on Lyrica but that caused her to be excessively sleepy.  She went to the chiropractor and states that she is doing much better from that regard.  Unfortunately, she has had bronchitis and a urinary tract infection.  She has been on several antibiotics, which have caused her to be nauseated.  She has also had  dental work and had several of her teeth pulled.  Because of these things, she has had a decreased appetite and really has not been able to eat.  She had a temporary partial placed, but her appetite still hasn't been what it used to be, primarily because of the nausea from the antibiotic.  She has not been exercising because of being sick.  She is hoping to get back to that soon.  02/02/14 update:  The patient presents today with her husband, who supplements the history.  The patient is currently on carbidopa/levodopa 25/100, 1 tablet at 9 AM/1 PM and 2 tablets at 6 PM.   She is also on Comtan 200 mg, one tablet 3 times per day.   The discomfort in the foot is much better.  From a Parkinson's standpoint, she has been doing well.  No falls.  No hallucinations.  The patient was screened through our Parkinson's screening program on 01/16/2014 and was either at baseline or better than baseline at all therapies and is scheduled for re-screening in January.  She is not currently exercising.    06/05/14 update:  The patient presents today with her husband, who supplements the history.  The patient is currently on carbidopa/levodopa 25/100, 1 tablet at 9 AM/1 PM and 2 tablets at 6 PM.   She is also on Comtan 200 mg, one tablet 3 times per day.  No falls.  No lightheadedness or near syncope.  No hallucinations.   I reviewed records since last visit.  Weight loss has been a concern.  The dietician has talked with her about suggestions to help, and the patient states that she was told to take Carnation 3 times per day.  Pt states that the biggest issue that has caused weight loss has been persistent nausea, which has been going on for about 2 months.  They really do not think that it is related to the Parkinson's medications, because it did not start with changes in Parkinson's medications.  It actually seemed to start after a visit to a restaurant and then persisted.  She just feels nauseated, has lost taste for food and  has lost appetite.  She followed up with Dr. Kenton Kingfisher and was started on Zantac about 3 or 4 days ago and does feel better and her blood pressure medication was discontinued.  10/04/14 update:  The patient presents today with her husband, who supplements the history.  The patient is currently on carbidopa/levodopa 25/100, 1 tablet at 9 AM/1 PM and 2 tablets at 6 PM.   She is also on Comtan 200 mg, one tablet 3 times per day. We d/c her comtan  for a week to see if her GI upset changed at all and it did not so we restarted it since obviously that was not the etiology.  She continues to have GI upset in the AM.  She went to her PCP and she was given Zofran and states that it made it worse but she only took it once.  It is only the AM when she has nausea and she does better the rest of the day.    12/14/14 update:  The patient returns today, accompanied by her husband who supplements the history.  Records were reviewed since last visit.  She has a history of breast cancer and was having weight loss/anorexia and saw her oncologist.  She ended up having a CT chest, abdomen and pelvis and those were unremarkable for any new lesions.  She has lost about 13 pounds since March of last year.  She has been complaining about nausea the last several visits.  In fact, we've previously stopped her entacapone to see if that would help the nausea, but it did not.  She did not think that it was related at all to her levodopa.  She no longer has the nausea but in the AM she doesn't feel good.  She has difficulty describing what this means.  She remains on carbidopa/levodopa 25/100, 1 tablet at 9 AM/one tablet at 1 PM and 2 tablets at 6 PM.  She is still on entacapone, 200 mg 3 times a day.  They brought with them a list of blood pressures and while the SBP is generally high (170's-190's without med and 150's-180's with BP med) it was never over 200.  She states that she was off the BP med a few days and felt better but when she went  back on it she felt worse again.  She was told to go back on it because it her BP's were too high.  She is only on amlodipine 2.5 mg.    03/05/15 update:  The patient is following up today regarding her Parkinson's disease.  She is currently on carbidopa/levodopa 25/100, 1 tablet at 9 AM, 1 tablet at 1 PM and 2 tablets at 6 PM.  She takes entacapone with each of these dosages.  At last visit, she was very focused on her blood pressure and I asked her to follow-up with her primary care physician to see if she actually needed her blood pressure medication.  Today, the patient and her husband state that she was able to d/c her amlodipine and her BP has been well controlled. She states that the nausea is better.  She states that she gets up in the AM and takes her med and just feels jittery and anxious.  Her husband asks about trying to take a levodopa at bedtime to see if that helps.  When she takes her xanax, the anxiety does seem to get better.  They were thinking about asking PCP to see if she can go up on the medication.  Husband admits that balance sometimes off and cognition not great.  PT did not think that she needed walker per their hx.  She has gotten back to her exercise.   By 3-4 pm, pt feels well.    06/11/15 update:  The patient presents today for follow-up, accompanied by her husband who supplements the history.  She is on carbidopa/levodopa 25/100, 1 tablet at 9 AM, 1 tablet at 1 PM and 2 tablets at 6 PM.  She is on entacapone  200 mg 3 times a day and last visit we added carbidopa/levodopa 50/200 at night.  She didn't notice a big difference with that. Her husband isn't sure that she really gave it enough time to know.   Her biggest issue last visit was her anxiety and I told her to try an extra levodopa when she is anxious to see if that helps.  They tried that and it didn't help.    I did tell her that I did not want her taking daily Xanax.  Today, the patient states that the "only thing that helps  is xanax."   Unfortunately, she has not been exercising.  Her husband is undergoing tx daily for prostate CA and they are consumed with that.  Balance has been okay and her husband states that she is doing well in that regard.  No falls.  She is sleeping from 9pm until 5-6am. She is taking naps during the day.   09/11/15 update:  The patient follows up today, accompanied by her husband who supplements the history.  She is on carbidopa/levodopa 25/100, 1 tablet at 9 AM, 1 at 1 PM and 2 tablets at 6 PM.  I asked her to retry carbidopa/levodopa 50/200 at night last visit.  She is still on the entacapone 200 mg 3 times per day.  Last visit, I encouraged her to discontinue her daily Xanax and we started her on Wellbutrin XL for her anxiety.  Her husband thinks that she is still on it (and off of xanax) but wellbutrin is not on her medication list today.  Anxiety has been better per both patient and husband.  Husband states that Dr. Kenton Kingfisher told them to ask me about sertraline.  She was going to come in and talk to me on January 11, but ended up having increased hallucinations and was admitted to the hospital from January 11 to 08/11/2015.  She was treated for urinary tract infection although her urine culture ended up being negative.  Husband reports that she is 90-95% better from a mental standpoint.  No hallucinations since d/c from the hospital.  Doing home PT/OT/ST and nurse comes once per week.    01/09/16 update:  The patient follows up today, accompanied by her husband who supplements the history.  She is on carbidopa/levodopa 25/100, 1 tablet at 9 AM, 1 at 1 PM and 2 tablets at 6 PM.  For some reason, her carbidopa/levodopa 50/200 got accidentally stopped, and I told them to just go ahead and hold that for now.  She is still on the entacapone 200 mg 3 times per day.  Last visit, her husband was not sure if she was still on the Wellbutrin and ended up calling me back.  This was stopped during her hospitalization.   She, therefore, started on Zoloft instead right after our last visit.  She took a single 25 mg tablet and felt nauseated, but I reminded her that she feels nauseated a lot in the morning and asked her to retry it, telling her that she could take it before bedtime and then work up to 50 mg at night.  She is only on 25 mg and husband states that she is doing well and anxiety is better.  Only time an issue is that she hates to get in a car and travel.    Nausea is also better.    05/13/16 update:  The patient follows up today, accompanied by her husband who supplements the history.  She is on  carbidopa/levodopa 25/100, 1 tablet at 9 AM, 1 at 1 PM and 2 tablets at 6 PM.  She is on entacapone 200 mg 3 times a day with each of these dosages.  For the anxiety and depression, she is on Zoloft which has been increased to 50 mg daily.  That has helped.  She was hospitalized from 9/18-9/22 for CAP.  The records that were made available to me were reviewed. Having a facet joint injection tomorrow by Dr. Jacelyn Grip for back pain.  Not been able to get back to exercise because of pneumonia and back pain.  Has PD screen on 11/28.  08/19/16 update:  The patient follows up today, accompanied by her husband who supplements the history.  She is on carbidopa/levodopa 25/100, 1 tablet at 9 AM, 1 at 1 PM and 2 tablets at 6 PM.  She is on entacapone 200 mg 3 times a day with each of these dosages.  For the anxiety and depression, she is on Zoloft which has been increased to 50 mg daily.  Husband states that "she is not as happy as the gal I married."  The records that were made available to me were reviewed.  Pt denies falls.  Pt denies lightheadedness, near syncope.  No hallucinations.  Husband states that yesterday was a really bad day both physically and mentally (more confused and had help getting dressed).  Is much better today.  Appetite is overall down.  Drinking plenty of water.  Not exercising.  Is sleeping a lot per husband.  Husband states that was having back pain.  Went and had rhizotomy and that helped and then PT ordered but husband wants it at neurorehab center as patient very unsteady.  11/24/16 update: Patient seen today, accompanied by her husband to supplements the history.  Patient remains on carbidopa/levodopa 25/100, one tablet at 1 tablet at 9am, 1 PM and 2 tablets at 6 PM.  She takes entacapone 200 mg 3 times per day.  She is on sertraline, 50 mg daily.  She still suffers from rather significant anxiety.  She had one fall but they aren't sure if since last visit but thinks it was.  No hallucinations.  She has been attending outpatient Parkinson's therapies and I have reviewed those records.    04/06/17 update:  Pt seen in f/u for parkinsonism.  This patient is accompanied in the office by her spouse who supplements the history.  On carbidopa/levodopa 25/100, 1/1/2 and comtan 200 mg with each dosage.  Still on zoloft, 50 mg for anxiety, which continues to be problematic.  The records that were made available to me were reviewed.  Completed therapies since our last visit.  Also hospitalized in July for hematochezia with associated significant anemia, requiring transfusion.  EGD was negative.  Bleed was suspected lower GI in nature, likely from diverticular disease.  No falls.  Little off balance.  No hallucinations.  Memory is getting worse.  Some trouble at times knowing how to get in and out of bed.   Neuroimaging has  previously been performed.  An MRI brain was last done in 2010 but pt can no longer have MRI's because of an implantable hearing device.    PREVIOUS MEDICATIONS: none to date  ALLERGIES:  No Known Allergies  CURRENT MEDICATIONS:  Current Outpatient Prescriptions on File Prior to Visit  Medication Sig Dispense Refill  . acetaminophen (TYLENOL) 325 MG tablet Take 650 mg by mouth every 6 (six) hours as needed for mild pain, moderate  pain, fever or headache.     [name] acyclovir (ZOVIRAX) 400 MG tablet  Take 400 mg by mouth 2 (two) times daily.    . bisacodyl (DULCOLAX) 5 MG EC tablet Take 5 mg by mouth daily as needed for moderate constipation.    . Calcium Carbonate-Vitamin D3 (CALCIUM 600-D) 600-400 MG-UNIT TABS Take 1 tablet by mouth at bedtime.    . carbidopa-levodopa (SINEMET IR) 25-100 MG tablet 1 tablet in the morning by mouth, 1 in the afternoon by mouth, 2 in the evening by mouth. (Patient taking differently: Take 1-2 tablets by mouth 3 (three) times daily. Pt takes one tablet in the morning, one in the afternoon, and two in the evening.) 360 tablet 1  . cetirizine (ZYRTEC) 10 MG tablet Take 10 mg by mouth daily at 12 noon.     . cholecalciferol (VITAMIN D) 1000 units tablet Take 1,000 Units by mouth every evening.     . entacapone (COMTAN) 200 MG tablet Take 1 tablet (200 mg total) by mouth 3 (three) times daily. TAKE 1 TABLET (200 MG TOTAL) BY MOUTH 3 (THREE) TIMES DAILY - take with Sinemet (Patient taking differently: Take 200 mg by mouth 3 (three) times daily. ) 270 tablet 1  . Fluticasone-Salmeterol (ADVAIR) 250-50 MCG/DOSE AEPB Inhale 1 puff into the lungs 2 (two) times daily.    . mirabegron ER (MYRBETRIQ) 50 MG TB24 tablet Take 50 mg by mouth daily at 12 noon.     . Multiple Vitamins-Minerals (MULTIVITAMIN WITH MINERALS) tablet Take 1 tablet by mouth daily with breakfast.      No current facility-administered medications on file prior to visit.     PAST MEDICAL HISTORY:   Past Medical History:  Diagnosis Date  . Anxiety   . Asthma   . Depression   . Diabetes mellitus without complication ([place])    01/09/06.Marland KitchenMarland Kitchenpt denies  . GERD (gastroesophageal reflux disease)   . Hearing loss   . NHL (non-Hodgkin's lymphoma) ([place])    nhl dx 9/04 breast ca dx1/12  . Parkinson disease ([place])     PAST SURGICAL HISTORY:   Past Surgical History:  Procedure Laterality Date  . Ba-HA Ear implant    . ESOPHAGOGASTRODUODENOSCOPY N/A 02/04/2017   Procedure: ESOPHAGOGASTRODUODENOSCOPY (EGD);   Surgeon: Arta Silence, MD;  Location: [facility];  Service: Endoscopy;  Laterality: N/A;  . MASTECTOMY  2 /8/ 12   bilateral  . MASTECTOMY      SOCIAL HISTORY:   Social History   Social History  . Marital status: Married    Spouse name: N/A  . Number of children: N/A  . Years of education: N/A   Occupational History  . retired Retired    Pharmacist, hospital   Social History Main Topics  . Smoking status: Never Smoker  . Smokeless tobacco: Never Used     Comment: husband wsa smoker  . Alcohol use No     Comment: no  . Drug use: No  . Sexual activity: Not on file   Other Topics Concern  . Not on file   Social History Narrative  . No narrative on file    FAMILY HISTORY:   Family Status  Relation Status  . Father Deceased       CA, lung  . Mother Deceased       CA; complications of splenectomy  . Brother Deceased       3, renal failure, DM-2; CAD; CA  . Child Alive  5 (4 biological), one with PKU    ROS:  A complete 10 system review of systems was obtained and was unremarkable apart from what is mentioned above.  PHYSICAL EXAMINATION:    VITALS:   Vitals:   04/06/17 1115  BP: (!) 150/92  Pulse: 82  Weight: 129 lb (58.5 kg)  Height: 5\' 3"  (1.6 m)   Wt Readings from Last 3 Encounters:  04/06/17 129 lb (58.5 kg)  02/07/17 139 lb 6.4 oz (63.2 kg)  12/15/16 126 lb 4.8 oz (57.3 kg)     GEN:  The patient appears stated age and is in NAD. CV:  RRR with 3/6 SEM Lungs:  CTAB Neck:  No bruits but cardiac murmur radiates to the bilateral carotids   Neurological examination:  Orientation: She is alert to person and place [facility] Cognitive Assessment  05/13/2016 09/11/2015 12/14/2014  Visuospatial/ Executive (0/5) 3 3 3   Naming (0/3) 2 3 3   Attention: Read list of digits (0/2) 2 1 2   Attention: Read list of letters (0/1) 1 1 1   Attention: Serial 7 subtraction starting at 100 (0/3) 0 0 0  Language: Repeat phrase (0/2) 1 0 1  Language : Fluency (0/1) 1  1 1   Abstraction (0/2) 1 1 1   Delayed Recall (0/5) 0 0 0  Orientation (0/6) 5 6 6   Total 16 16 18   Adjusted Score (based on education) 17 17 19    Cranial nerves: There is good facial symmetry.  The visual fields are full to confrontational testing. The speech is fluent and clear.  She is hypophonic.  Soft palate rises symmetrically and there is no tongue deviation. Hearing is intact to conversational tone. Sensation: Sensation is intact to light outh throughout. Motor: Strength is 5/5 in the bilateral upper and lower extremities.   Shoulder shrug is equal and symmetric.  There is no pronator drift.   Movement examination: Tone: There is mild rigidity in the RUE Abnormal movements: There is rare tremor in RUE/L fingers. Coordination:  There is no decremation noted today Gait and Station: The patient has minimal trouble getting OOC without the use of the hands.  Stride length and arm swing are good.  She has pisa syndrome.  She walks with and without the cane and is more confident with the cane.    Labs:  Lab Results  Component Value Date   WBC 4.4 02/06/2017   HGB 8.0 (L) 02/07/2017   HCT 23.2 (L) 02/07/2017   MCV 84.8 02/06/2017   PLT 114 (L) 02/06/2017     Chemistry      Component Value Date/Time   NA 141 02/06/2017 0548   NA 145 12/15/2016 1140   K 4.0 02/06/2017 0548   K 4.4 12/15/2016 1140   CL 113 (H) 02/06/2017 0548   CL 106 12/26/2012 1110   CO2 19 (L) 02/06/2017 0548   CO2 29 12/15/2016 1140   BUN 7 02/06/2017 0548   BUN 17.0 12/15/2016 1140   CREATININE 0.91 02/06/2017 0548   CREATININE 1.1 12/15/2016 1140      Component Value Date/Time   CALCIUM 7.9 (L) 02/06/2017 0548   CALCIUM 9.5 12/15/2016 1140   ALKPHOS 66 02/03/2017 0216   ALKPHOS 97 12/15/2016 1140   AST 32 02/03/2017 0216   AST 21 12/15/2016 1140   ALT 12 (L) 02/03/2017 0216   ALT 9 12/15/2016 1140   BILITOT 1.1 02/03/2017 0216   BILITOT 0.28 12/15/2016 1140      Lab Results  Component  Value Date   [id number] 3710 (H) 06/10/2006   Lab Results  Component Value Date   TSH 2.93 09/19/2012    ASSESSMENT/PLAN:  1.  Idiopathic Parkinson's disease, dx 08/2012.  This is evidenced by bradykinesia, tremor, postural instability and rigidity.  There are no atypical features.  -We discussed the diagnosis as well as pathophysiology of the disease.  We discussed treatment options as well as prognostic indicators.  Patient education was provided.  - she is currently on carbidopa/levodopa 25/100 one tablet at 9 AM/one tablet at 1 PM and 2 tablets at 6 PM and will continue this.  She needs to avoid protein when taking levodopa and discussed this.  Risks, benefits, side effects and alternative therapies were discussed.  The opportunity to ask questions was given and they were answered to the best of my ability.  The patient expressed understanding and willingness to follow the outlined treatment protocols.  -Pt is on entacapone tid and she will continue on this.    -was supposed to restart carbidopa/levodopa 50/200 q hs previously but didn't and decided to hold on that for now.  -continue PT/OT/ST 2.  Anxiety  -she is still on sertraline 50 mg daily 3.  Cardiac murmur  -being followed yearly by cardiology for mod AS 4.  PDD  -talked about acetylcholinesterase inhibitors.  She and her husband would like to try that.  She would like to that.  Start aricept 5 mg per day x 1 month and then up to 10 mg daily.  - Is free of hallucinations but if occur again, will consider nuplazid.   -told her she needs to follow a regular schedule.  She is sleeping much of the day and fortunately she doesn't have day/night reversal  -she asked me about driving today and I told her no driving.  She hasn't drive for years. 5. Follow up is anticipated in the next few months, sooner should new neurologic issues arise.  Much greater than 50% of this visit was spent in counseling and coordinating care.  Total face to  face time:  25 min

## 2017-04-06 ENCOUNTER — Ambulatory Visit (INDEPENDENT_AMBULATORY_CARE_PROVIDER_SITE_OTHER): Payer: Medicare Other | Admitting: Neurology

## 2017-04-06 ENCOUNTER — Encounter: Payer: Self-pay | Admitting: Neurology

## 2017-04-06 VITALS — BP 150/92 | HR 82 | Ht 63.0 in | Wt 129.0 lb

## 2017-04-06 DIAGNOSIS — F0280 Dementia in other diseases classified elsewhere without behavioral disturbance: Secondary | ICD-10-CM

## 2017-04-06 DIAGNOSIS — F411 Generalized anxiety disorder: Secondary | ICD-10-CM

## 2017-04-06 DIAGNOSIS — G20 Parkinson's disease: Secondary | ICD-10-CM

## 2017-04-06 MED ORDER — DONEPEZIL HCL 10 MG PO TABS: 10.0000 mg | ORAL_TABLET | Freq: Every day | ORAL | 1 refills | Status: DC

## 2017-04-06 MED ORDER — DONEPEZIL HCL 5 MG PO TABS: 5.0000 mg | ORAL_TABLET | Freq: Every day | ORAL | 0 refills | Status: DC

## 2017-04-06 NOTE — Patient Instructions (Signed)
Start donepezil 5 mg daily at bed for a month and then increase to 10 mg daily thereafter  Make sure that you are doing mental and physical exercises in daily with a regular schedule/regular routine

## 2017-04-13 ENCOUNTER — Ambulatory Visit (HOSPITAL_BASED_OUTPATIENT_CLINIC_OR_DEPARTMENT_OTHER): Payer: Medicare Other | Admitting: Oncology

## 2017-04-13 ENCOUNTER — Telehealth: Payer: Self-pay | Admitting: Oncology

## 2017-04-13 ENCOUNTER — Other Ambulatory Visit (HOSPITAL_BASED_OUTPATIENT_CLINIC_OR_DEPARTMENT_OTHER): Payer: Medicare Other

## 2017-04-13 VITALS — BP 155/77 | HR 79 | Temp 97.7°F | Resp 17 | Ht 63.0 in | Wt 129.6 lb

## 2017-04-13 DIAGNOSIS — C50912 Malignant neoplasm of unspecified site of left female breast: Principal | ICD-10-CM

## 2017-04-13 DIAGNOSIS — C50911 Malignant neoplasm of unspecified site of right female breast: Secondary | ICD-10-CM

## 2017-04-13 DIAGNOSIS — Z853 Personal history of malignant neoplasm of breast: Secondary | ICD-10-CM | POA: Diagnosis not present

## 2017-04-13 DIAGNOSIS — C8590 Non-Hodgkin lymphoma, unspecified, unspecified site: Secondary | ICD-10-CM

## 2017-04-13 DIAGNOSIS — Z170 Estrogen receptor positive status [ER+]: Principal | ICD-10-CM

## 2017-04-13 LAB — CBC WITH DIFFERENTIAL/PLATELET
BASO%: 0.7 % (ref 0.0–2.0)
BASOS ABS: 0 10*3/uL (ref 0.0–0.1)
EOS%: 2.2 % (ref 0.0–7.0)
Eosinophils Absolute: 0.1 10*3/uL (ref 0.0–0.5)
HCT: 35.2 % (ref 34.8–46.6)
HEMOGLOBIN: 11.7 g/dL (ref 11.6–15.9)
LYMPH#: 0.8 10*3/uL — AB (ref 0.9–3.3)
LYMPH%: 17 % (ref 14.0–49.7)
MCH: 28.8 pg (ref 25.1–34.0)
MCHC: 33.2 g/dL (ref 31.5–36.0)
MCV: 86.7 fL (ref 79.5–101.0)
MONO#: 0.3 10*3/uL (ref 0.1–0.9)
MONO%: 6.5 % (ref 0.0–14.0)
NEUT%: 73.6 % (ref 38.4–76.8)
NEUTROS ABS: 3.4 10*3/uL (ref 1.5–6.5)
Platelets: 132 10*3/uL — ABNORMAL LOW (ref 145–400)
RBC: 4.06 10*6/uL (ref 3.70–5.45)
RDW: 13.6 % (ref 11.2–14.5)
WBC: 4.6 10*3/uL (ref 3.9–10.3)
nRBC: 0 % (ref 0–0)

## 2017-04-13 LAB — COMPREHENSIVE METABOLIC PANEL
ALK PHOS: 97 U/L (ref 40–150)
ALT: 7 U/L (ref 0–55)
ANION GAP: 9 meq/L (ref 3–11)
AST: 20 U/L (ref 5–34)
Albumin: 3.8 g/dL (ref 3.5–5.0)
BILIRUBIN TOTAL: 0.31 mg/dL (ref 0.20–1.20)
BUN: 19.5 mg/dL (ref 7.0–26.0)
CO2: 27 meq/L (ref 22–29)
Calcium: 9.6 mg/dL (ref 8.4–10.4)
Chloride: 107 mEq/L (ref 98–109)
Creatinine: 1.2 mg/dL — ABNORMAL HIGH (ref 0.6–1.1)
EGFR: 40 mL/min/{1.73_m2} — AB (ref >90)
GLUCOSE: 103 mg/dL (ref 70–140)
Potassium: 4.1 mEq/L (ref 3.5–5.1)
SODIUM: 143 meq/L (ref 136–145)
TOTAL PROTEIN: 6.2 g/dL — AB (ref 6.4–8.3)

## 2017-04-13 NOTE — Telephone Encounter (Signed)
Gave patient avs report and appointments for January 2019.  °

## 2017-04-13 NOTE — Progress Notes (Signed)
[facility] OFFICE PROGRESS NOTE   Diagnosis: Non-Hodgkin's,, breast cancer  INTERVAL HISTORY:   [name] returns for scheduled visit. She feels well. Good appetite. She is followed by Dr. [name] for a movement disorder. She is also taking Aricept for memory loss. She was admitted in July with GI bleeding. She was transfused with packed red blood cells. An upper endoscopy revealed no source for bleeding. She had a presumed diverticular bleed. She had mild thrombocytopenia while in the hospital.  Objective:  Vital signs in last 24 hours:  Blood pressure (!) 155/77, pulse 79, temperature 97.7 F (36.5 C), temperature source Oral, resp. rate 17, height 5' 3"  (1.6 m), weight 129 lb 9.6 oz (58.8 kg), SpO2 97 %.    HEENT: Neck without mass Lymphatics: No cervical, supraclavicular, axillary, or inguinal nodes Resp: Scattered end inspiratory rhonchi, no respiratory distress Cardio: Regular rate and rhythm, 2/6 systolic murmur GI: No mass, no hepatosplenomegaly, nontender Vascular: No leg edema Breasts: Status post bilateral mastectomy. No evidence for chest wall tumor recurrence     Lab Results:  Lab Results  Component Value Date   WBC 4.6 04/13/2017   HGB 11.7 04/13/2017   HCT 35.2 04/13/2017   MCV 86.7 04/13/2017   PLT 132 (L) 04/13/2017   NEUTROABS 3.4 04/13/2017    CMP     Component Value Date/Time   NA 143 04/13/2017 1155   K 4.1 04/13/2017 1155   CL 113 (H) 02/06/2017 0548   CL 106 12/26/2012 1110   CO2 27 04/13/2017 1155   GLUCOSE 103 04/13/2017 1155   GLUCOSE 108 (H) 12/26/2012 1110   BUN 19.5 04/13/2017 1155   CREATININE 1.2 (H) 04/13/2017 1155   CALCIUM 9.6 04/13/2017 1155   PROT 6.2 (L) 04/13/2017 1155   ALBUMIN 3.8 04/13/2017 1155   AST 20 04/13/2017 1155   ALT 7 04/13/2017 1155   ALKPHOS 97 04/13/2017 1155   BILITOT 0.31 04/13/2017 1155   GFRNONAA 55 (L) 02/06/2017 0548   GFRAA >60 02/06/2017 0548     Medications: I have  reviewed the patient's current medications.  Assessment/Plan: 1. Non-Hodgkin lymphoma treated with fludarabine/rituximab, last in July of 2009.  1. Restaging CT 11/24/2010 revealed evidence of progressive lymphoma in the abdomen and pelvis.  2. Initiation of salvage therapy with bendamustine/rituximab 12/04/2010. She completed 3 cycles.  3. Restaging CT 03/03/2011 revealed stable retroperitoneal soft tissue and a marked decrease in the soft tissue thickening associated with dilated loops of small bowel. 2. History of ITP. Intermittent mild thrombocytopenia persists. 3. History of Herpes zoster-maintained on prophylactic acyclovir.  4. Anemia secondary to non-Hodgkin lymphoma, chemotherapy, and a history of autoimmune hemolysis.  5. Hypertension. 6. Right-hand tremor/ataxia-followed by neurology, now taking Sinemet, diagnosed with Parkinson's disease 7. Hearing loss-status post placement of an implanted hearing device by Dr. Cresenciano Lick. 8. Bilateral breast cancer, right breast cancer-grade 1, T1 N0, ER/PR positive, and HER2 negative. Left breast cancer, synchronous grade 2, T2 N0, ER/PR positive, and HER2 negative. She began adjuvant Arimidex on 09/15/2010,completed February 2017 9. Inflammatory changes of both lungs on a CT of the chest 11/24/2010-progressive on the restaging CT 03/03/2011, status post a bronchoscopy 03/17/2011 with no evidence of granulomata or tumor. 10. 11 mm spiculated lesion in the lingula on a CT 11/24/2010-less distinct on the CT 03/03/2011. 11. Upper respiratory infection while visiting her son in [place], June 2013, resolved after treatment with Levaquin/steroids 12. Upper respiratory infection,? Pneumonia summer 2014-chest x-ray 02/17/2013 with bilateral infiltrates 13.  CT chest abdomen and pelvis on 11/29/2014 with no evidence of active lymphoma or metastatic breast cancer. Previously demonstrated retroperitoneal process appeared improved with probable residual  fibrosis. No discrete adenopathy. Interval near complete resolution of patchy airspace opacities in both lungs. 14. Mild swelling and pain at the right lower leg 08/06/2016-negative for acute deep vein thrombosis, possible chronic thrombus in the right popliteal vein  15. Admission July 2018 with GI bleeding, no source for blood loss found on an upper endoscopy    Disposition:  She appears stable. She is in clinical remission from breast cancer and lymphoma. She will return for an office and lab visit in 4 months. I am available to see her in the interim as needed. 15 minutes were spent with the patient today. The majority of the time was used for counseling and coordination of care.  Donneta Romberg, MD  04/13/2017  1:02 PM

## 2017-04-16 DIAGNOSIS — K219 Gastro-esophageal reflux disease without esophagitis: Secondary | ICD-10-CM | POA: Diagnosis not present

## 2017-04-16 DIAGNOSIS — F419 Anxiety disorder, unspecified: Secondary | ICD-10-CM | POA: Diagnosis not present

## 2017-04-16 DIAGNOSIS — N183 Chronic kidney disease, stage 3 (moderate): Secondary | ICD-10-CM | POA: Diagnosis not present

## 2017-04-16 DIAGNOSIS — Z23 Encounter for immunization: Secondary | ICD-10-CM | POA: Diagnosis not present

## 2017-04-16 DIAGNOSIS — E7800 Pure hypercholesterolemia, unspecified: Secondary | ICD-10-CM | POA: Diagnosis not present

## 2017-04-16 DIAGNOSIS — G8929 Other chronic pain: Secondary | ICD-10-CM | POA: Diagnosis not present

## 2017-04-16 DIAGNOSIS — M545 Low back pain: Secondary | ICD-10-CM | POA: Diagnosis not present

## 2017-04-16 DIAGNOSIS — G20 Parkinson's disease: Secondary | ICD-10-CM | POA: Diagnosis not present

## 2017-05-04 ENCOUNTER — Telehealth: Payer: Self-pay | Admitting: Neurology

## 2017-05-04 DIAGNOSIS — R410 Disorientation, unspecified: Secondary | ICD-10-CM

## 2017-05-04 NOTE — Telephone Encounter (Signed)
Pt's daughter Lynelle Smoke called in regards to increased confusion and behavorial changes, please call and advise [phone]# [phone]

## 2017-05-05 NOTE — Telephone Encounter (Signed)
Daughter not on DPR.  LMOM for patient's husband to call back.

## 2017-05-05 NOTE — Telephone Encounter (Signed)
No objection to UA with c&s.  Pt has dementia though.  Also make sure that daughter is on release.  If not, will have to relay via husband

## 2017-05-05 NOTE — Telephone Encounter (Signed)
Spoke with patient's daughter and she wanted to let us know about some changes. States patient has had more confusion lately (ex. Pouring cereal in strawberry container instead of a bowl). It has been happening for the last couple weeks (more noticeably) and the last time patient had memory issues she ended up with a severe UTI.  They were wondering if they could have urinalysis to rule this out just in case.  Please advise.

## 2017-05-05 NOTE — Telephone Encounter (Signed)
Spoke with patient's husband and made aware I have ordered testing. He will bring her by office to check in to the lab downstairs.

## 2017-05-07 ENCOUNTER — Other Ambulatory Visit: Payer: Medicare Other

## 2017-05-07 ENCOUNTER — Other Ambulatory Visit: Payer: Self-pay | Admitting: Neurology

## 2017-05-07 DIAGNOSIS — R410 Disorientation, unspecified: Secondary | ICD-10-CM

## 2017-05-08 LAB — URINALYSIS
BILIRUBIN URINE: NEGATIVE
Glucose, UA: NEGATIVE
HGB URINE DIPSTICK: NEGATIVE
KETONES UR: NEGATIVE
Leukocytes, UA: NEGATIVE
NITRITE: NEGATIVE
PROTEIN: NEGATIVE
SPECIFIC GRAVITY, URINE: 1.007 (ref 1.001–1.03)
pH: 7.5 (ref 5.0–8.0)

## 2017-05-08 LAB — URINE CULTURE
MICRO NUMBER:: 81140162
SPECIMEN QUALITY:: ADEQUATE

## 2017-05-10 ENCOUNTER — Telehealth: Payer: Self-pay | Admitting: Neurology

## 2017-05-10 NOTE — Telephone Encounter (Signed)
Spoke with husband and he was made aware.

## 2017-05-10 NOTE — Telephone Encounter (Signed)
-----   Message from [place], DO sent at 05/10/2017  7:32 AM EDT ----- UA looks normal.  I suspect that behavior that daughter is noting is from dementia.  I believe that daughter doesn't live near them and perhaps was just seeing this?  In either case, is likely just advancing dementia.

## 2017-05-26 DIAGNOSIS — R110 Nausea: Secondary | ICD-10-CM | POA: Diagnosis not present

## 2017-06-07 ENCOUNTER — Telehealth: Payer: Self-pay | Admitting: Neurology

## 2017-06-07 DIAGNOSIS — R1319 Other dysphagia: Secondary | ICD-10-CM

## 2017-06-07 NOTE — Telephone Encounter (Signed)
We have scheduled you at [facility] for your modified barium swallow on 06/16/17 at 11:30 am. Please arrive 15 minutes prior and go to 1st floor radiology. If you need to reschedule for any reason please call [phone]. Patient's husband made aware of appt information above.

## 2017-06-07 NOTE — Telephone Encounter (Signed)
Spoke with patient. She states when she eats different things (crackers, chocolate) she feels like she is choking and she is coughing a lot. She wants to know if any sort of test to figure out why.   She has not had a modified barium swallow, okay to order?

## 2017-06-07 NOTE — Telephone Encounter (Signed)
Pt's spouse called and wanted a call back from [facility] to discuss some things but did not go into detail

## 2017-06-07 NOTE — Telephone Encounter (Signed)
yes

## 2017-06-07 NOTE — Telephone Encounter (Signed)
Left message on machine for patient to call back.

## 2017-06-09 ENCOUNTER — Other Ambulatory Visit (HOSPITAL_COMMUNITY): Payer: Self-pay | Admitting: Neurology

## 2017-06-09 DIAGNOSIS — R1310 Dysphagia, unspecified: Secondary | ICD-10-CM

## 2017-06-16 ENCOUNTER — Ambulatory Visit (HOSPITAL_COMMUNITY)
Admission: RE | Admit: 2017-06-16 | Discharge: 2017-06-16 | Disposition: A | Discharge status: Home / Self Care | Payer: Medicare Other | Source: Ambulatory Visit | Attending: Neurology | Admitting: Neurology

## 2017-06-16 DIAGNOSIS — R1310 Dysphagia, unspecified: Secondary | ICD-10-CM | POA: Diagnosis not present

## 2017-06-16 DIAGNOSIS — R1319 Other dysphagia: Secondary | ICD-10-CM

## 2017-06-16 NOTE — Progress Notes (Signed)
Modified Barium Swallow Progress Note  Patient Details  Name: [name] MRN: [id number] Date of Birth: Oct 27, 1927  Today's Date: 06/16/2017  Modified Barium Swallow completed.  Full report located under Chart Review in the Imaging Section.  Brief recommendations include the following:  Clinical Impression   Overall, pt exhibited a functional oropharyngeal phase of swallow with mild barium retention in the valleculae and pyriform sinsuses due to mild motor weakness. Swallow initiation was timely and coordinated. One episode flash penetration with thin barium via straw across multiple trials via cup. MBS does not diagnose the esophageal however scan revealed possible slower transit to GE junction (?). Mild residue was mostly cleared with cue for second swallow. SLP suspects symptoms may (?) be esophageal based. Recommend continue regular texture (use caution with meat and bread), thin liquids, small sips via cup and straw. Educated pt and husband re: compensatory strategies and diet recommendations (swallow twice intermittently during meals,). If symptoms worsen, consider further assessment of esophageal function.       Swallow Evaluation Recommendations:  Regular texture (use caution with bread and meats), thin liquids, swallow twice intermittently during meals.                            Houston Siren 06/16/2017,4:36 PM

## 2017-06-28 DIAGNOSIS — J069 Acute upper respiratory infection, unspecified: Secondary | ICD-10-CM | POA: Diagnosis not present

## 2017-06-28 DIAGNOSIS — H95191 Other disorders following mastoidectomy, right ear: Secondary | ICD-10-CM | POA: Diagnosis not present

## 2017-06-28 DIAGNOSIS — H908 Mixed conductive and sensorineural hearing loss, unspecified: Secondary | ICD-10-CM | POA: Diagnosis not present

## 2017-07-16 DIAGNOSIS — J069 Acute upper respiratory infection, unspecified: Secondary | ICD-10-CM | POA: Diagnosis not present

## 2017-07-16 DIAGNOSIS — H95191 Other disorders following mastoidectomy, right ear: Secondary | ICD-10-CM | POA: Diagnosis not present

## 2017-07-16 DIAGNOSIS — H908 Mixed conductive and sensorineural hearing loss, unspecified: Secondary | ICD-10-CM | POA: Diagnosis not present

## 2017-07-26 ENCOUNTER — Other Ambulatory Visit: Payer: Self-pay | Admitting: Neurology

## 2017-07-28 ENCOUNTER — Other Ambulatory Visit: Payer: Self-pay | Admitting: *Deleted

## 2017-07-28 MED ORDER — ACYCLOVIR 400 MG PO TABS: 400.0000 mg | ORAL_TABLET | Freq: Two times a day (BID) | ORAL | 0 refills | Status: DC

## 2017-07-29 DIAGNOSIS — K219 Gastro-esophageal reflux disease without esophagitis: Secondary | ICD-10-CM | POA: Diagnosis not present

## 2017-07-29 DIAGNOSIS — F419 Anxiety disorder, unspecified: Secondary | ICD-10-CM | POA: Diagnosis not present

## 2017-07-29 DIAGNOSIS — N183 Chronic kidney disease, stage 3 (moderate): Secondary | ICD-10-CM | POA: Diagnosis not present

## 2017-07-29 DIAGNOSIS — M545 Low back pain: Secondary | ICD-10-CM | POA: Diagnosis not present

## 2017-07-29 DIAGNOSIS — G20 Parkinson's disease: Secondary | ICD-10-CM | POA: Diagnosis not present

## 2017-07-29 DIAGNOSIS — L821 Other seborrheic keratosis: Secondary | ICD-10-CM | POA: Diagnosis not present

## 2017-07-29 DIAGNOSIS — G8929 Other chronic pain: Secondary | ICD-10-CM | POA: Diagnosis not present

## 2017-07-29 DIAGNOSIS — N3281 Overactive bladder: Secondary | ICD-10-CM | POA: Diagnosis not present

## 2017-07-29 DIAGNOSIS — I10 Essential (primary) hypertension: Secondary | ICD-10-CM | POA: Diagnosis not present

## 2017-07-31 ENCOUNTER — Other Ambulatory Visit: Payer: Self-pay | Admitting: Neurology

## 2017-08-05 ENCOUNTER — Ambulatory Visit: Payer: Medicare Other | Admitting: Occupational Therapy

## 2017-08-05 ENCOUNTER — Ambulatory Visit: Payer: Medicare Other | Attending: Neurology

## 2017-08-05 ENCOUNTER — Ambulatory Visit: Payer: Medicare Other | Admitting: Physical Therapy

## 2017-08-05 DIAGNOSIS — R278 Other lack of coordination: Secondary | ICD-10-CM | POA: Insufficient documentation

## 2017-08-05 DIAGNOSIS — R1312 Dysphagia, oropharyngeal phase: Secondary | ICD-10-CM

## 2017-08-05 DIAGNOSIS — R41841 Cognitive communication deficit: Secondary | ICD-10-CM

## 2017-08-05 DIAGNOSIS — R471 Dysarthria and anarthria: Secondary | ICD-10-CM

## 2017-08-05 DIAGNOSIS — R2689 Other abnormalities of gait and mobility: Secondary | ICD-10-CM | POA: Insufficient documentation

## 2017-08-05 NOTE — Therapy (Signed)
[facility] [address] [place], [name], [address] Phone: [phone]   Fax:  [phone]  Patient Details  Name: [name] MRN: [id number] Date of Birth: 1927/11/03 Referring Provider:  [name], MD  Encounter Date: 08/05/2017 .Occupational Therapy Parkinson's Disease Screen  Hand dominance:  RUE   Physical Performance Test item #2 (simulated eating):  13.18 sec  9-hole peg test:    RUE  25.56 sec        LUE  35.87 sec pt paused after putting pegs in due to cognition  Change in ability to perform ADLs/IADLs:  no  Other Comments:  Handwriting is legible and good size.  Pt does not require occupational therapy services at this time.  Recommended occupational therapy screen in   4 mons RINE,KATHRYN 08/05/2017, 8:24 AM  [facility] [address] [place], [name], [address] Phone: [phone]   Fax:  [phone]

## 2017-08-05 NOTE — Patient Instructions (Signed)
   When you eat: Eat slowly and take smaller bites with meat and bread. Smaller sips with water Swallow twice, dry, intermittently throughout meal (10-15x)

## 2017-08-05 NOTE — Therapy (Signed)
[facility] [address] [place], [name], [address] Phone: [phone]   Fax:  [phone]  Patient Details  Name: [name] MRN: [id number] Date of Birth: Jul 25, 1928 Referring Provider:  [name], MD  Encounter Date: 08/05/2017  Speech Therapy Parkinson's Disease Screen   Decibel Level today: mid 60sdB  (WNL=70-72 dB) in simple conversation, with sound level meter 30cm away from pt's mouth. Pt's conversational volume has remained essentially the same since last treatment course. Pt stated she "feels bad" she hasn't completed loud /a/. SLP encouraged pt to pair this with her medication administration, 5 reps, twice a day.  Husband stated pt missed medication recently. SLP suggested setting alarm on phone or with an alarm clock in a spare room in order to recall medication times. SLP encouraged husband to go behind pt and ensure meds have been taken.  Pt underwent swallowing evaluation on 06-16-17, and SLP reviewed these findings with pt/husband today, and provided handout. There were no problems with swallowing reported by husband or by pt.  Pt does does not require speech therapy services at this time. Recommend ST screen in another 4-6 months   [facility] ,[place], CCC-SLP  08/05/2017, 8:25 AM  [facility] [address] [place], [name], [address] Phone: [phone]   Fax:  [phone]

## 2017-08-05 NOTE — Therapy (Signed)
[facility] [address] [place], [name], [address] Phone: [phone]   Fax:  [phone]  Patient Details  Name: [name] MRN: [id number] Date of Birth: [birth year] Referring Provider:  [name], MD  Encounter Date: 08/05/2017   Physical Therapy Parkinson's Disease Screen   Timed Up and Go test:13.25 sec  10 meter walk test: 10.93 sec in 39M ( 3 ft/sec)  5 time sit to stand test: 13 sec    Patient does not require Physical Therapy services at this time.  Recommend Physical Therapy screen in 4-6 months.     Echo Allsbrook W. 08/05/2017, 8:11 AM  Frazier Butt., PT   [facility] [address] [place], [name], [address] Phone: [phone]   Fax:  [phone]

## 2017-08-07 ENCOUNTER — Other Ambulatory Visit: Payer: Self-pay | Admitting: Neurology

## 2017-08-10 ENCOUNTER — Inpatient Hospital Stay: Payer: Medicare Other

## 2017-08-10 ENCOUNTER — Telehealth: Payer: Self-pay | Admitting: Oncology

## 2017-08-10 ENCOUNTER — Inpatient Hospital Stay: Payer: Medicare Other | Attending: Oncology | Admitting: Oncology

## 2017-08-10 VITALS — BP 168/76 | HR 67 | Temp 97.9°F | Resp 18 | Ht 63.0 in | Wt 126.0 lb

## 2017-08-10 DIAGNOSIS — Z8572 Personal history of non-Hodgkin lymphomas: Secondary | ICD-10-CM | POA: Diagnosis not present

## 2017-08-10 DIAGNOSIS — C8590 Non-Hodgkin lymphoma, unspecified, unspecified site: Secondary | ICD-10-CM

## 2017-08-10 DIAGNOSIS — Z853 Personal history of malignant neoplasm of breast: Secondary | ICD-10-CM | POA: Insufficient documentation

## 2017-08-10 LAB — COMPREHENSIVE METABOLIC PANEL
ALBUMIN: 4 g/dL (ref 3.5–5.0)
ALT: 11 U/L (ref 0–55)
ANION GAP: 8 (ref 3–11)
AST: 19 U/L (ref 5–34)
Alkaline Phosphatase: 102 U/L (ref 40–150)
BUN: 15 mg/dL (ref 7–26)
CO2: 28 mmol/L (ref 22–29)
Calcium: 9.4 mg/dL (ref 8.4–10.4)
Chloride: 104 mmol/L (ref 98–109)
Creatinine, Ser: 1.31 mg/dL — ABNORMAL HIGH (ref 0.60–1.10)
GFR calc Af Amer: 41 mL/min — ABNORMAL LOW (ref >60)
GFR calc non Af Amer: 35 mL/min — ABNORMAL LOW (ref >60)
GLUCOSE: 72 mg/dL (ref 70–140)
POTASSIUM: 4.4 mmol/L (ref 3.3–4.7)
SODIUM: 140 mmol/L (ref 136–145)
Total Bilirubin: 0.6 mg/dL (ref 0.2–1.2)
Total Protein: 6.3 g/dL — ABNORMAL LOW (ref 6.4–8.3)

## 2017-08-10 LAB — CBC WITH DIFFERENTIAL/PLATELET
Basophils Absolute: 0 10*3/uL (ref 0.0–0.1)
Basophils Relative: 1 %
Eosinophils Absolute: 0.1 10*3/uL (ref 0.0–0.5)
Eosinophils Relative: 2 %
HEMATOCRIT: 36.2 % (ref 34.8–46.6)
Hemoglobin: 12.2 g/dL (ref 11.6–15.9)
LYMPHS PCT: 18 %
Lymphs Abs: 0.9 10*3/uL (ref 0.9–3.3)
MCH: 30.5 pg (ref 25.1–34.0)
MCHC: 33.7 g/dL (ref 31.5–36.0)
MCV: 90.5 fL (ref 79.5–101.0)
MONO ABS: 0.4 10*3/uL (ref 0.1–0.9)
MONOS PCT: 8 %
NEUTROS ABS: 3.4 10*3/uL (ref 1.5–6.5)
Neutrophils Relative %: 71 %
Platelets: 150 10*3/uL (ref 145–400)
RBC: 4 MIL/uL (ref 3.70–5.45)
RDW: 13.2 % (ref 11.2–16.1)
WBC: 4.7 10*3/uL (ref 3.9–10.3)

## 2017-08-10 NOTE — Telephone Encounter (Signed)
Scheduled appt per 1/15 los - Gave patient AVS and calender per los.  

## 2017-08-10 NOTE — Progress Notes (Signed)
[facility] OFFICE PROGRESS NOTE   Diagnosis: Breast cancer, ITP, non-Hodgkin's lymphoma  INTERVAL HISTORY:   Ms. Endres returns as scheduled.  She complains of lower back pain that progresses with activity.  She has been evaluated for the back pain including an MRI scan.  She walks daily for exercise.  Objective:  Vital signs in last 24 hours:  Pulse 67, temperature 97.9 F (36.6 C), temperature source Oral, resp. rate 18, height _0  (1.6 m), weight 126 lb (57.2 kg), SpO2 98 %.    HEENT: Neck without mass Lymphatics: No cervical, supraclavicular, axillary, or inguinal nodes Resp: Lungs clear bilaterally Cardio: Regular rate and rhythm GI: No hepatosplenomegaly, no mass, nontender Vascular: No leg edema Musculoskeletal: Tender at the low left and right paraspinous region, no mass Breast: Status post bilateral mastectomy.  No evidence for chest wall tumor recurrence.  Lab Results:  Lab Results  Component Value Date   WBC 4.7 08/10/2017   HGB 12.2 08/10/2017   HCT 36.2 08/10/2017   MCV 90.5 08/10/2017   PLT 150 08/10/2017   NEUTROABS 3.4 08/10/2017     Medications: I have reviewed the patient's current medications.   Assessment/Plan: 1. Non-Hodgkin lymphoma treated with fludarabine/rituximab, last in July of 2009.  1. Restaging CT 11/24/2010 revealed evidence of progressive lymphoma in the abdomen and pelvis.  2. Initiation of salvage therapy with bendamustine/rituximab 12/04/2010. She completed 3 cycles.  3. Restaging CT 03/03/2011 revealed stable retroperitoneal soft tissue and a marked decrease in the soft tissue thickening associated with dilated loops of small bowel. 2. History of ITP. Intermittent mild thrombocytopenia persists. 3. History of Herpes zoster-maintained on prophylactic acyclovir.  4. History of anemia secondary to non-Hodgkin lymphoma, chemotherapy, and a history of autoimmune hemolysis.  5. Hypertension. 6. Right-hand  tremor/ataxia-followed by neurology, now taking Sinemet, diagnosed with Parkinson's disease 7. Hearing loss-status post placement of an implanted hearing device by Dr. Cresenciano Lick. 8. Bilateral breast cancer, right breast cancer-grade 1, T1 N0, ER/PR positive, and HER2 negative. Left breast cancer, synchronous grade 2, T2 N0, ER/PR positive, and HER2 negative. She began adjuvant Arimidex on 09/15/2010,completed February 2017 9. Inflammatory changes of both lungs on a CT of the chest 11/24/2010-progressive on the restaging CT 03/03/2011, status post a bronchoscopy 03/17/2011 with no evidence of granulomata or tumor. 10. 11 mm spiculated lesion in the lingula on a CT 11/24/2010-less distinct on the CT 03/03/2011. 11. Upper respiratory infection while visiting her son in [place], June 2013, resolved after treatment with Levaquin/steroids 12. Upper respiratory infection,? Pneumonia summer 2014-chest x-ray 02/17/2013 with bilateral infiltrates 13. CT chest abdomen and pelvis on 11/29/2014 with no evidence of active lymphoma or metastatic breast cancer. Previously demonstrated retroperitoneal process appeared improved with probable residual fibrosis. No discrete adenopathy. Interval near complete resolution of patchy airspace opacities in both lungs. 14. Mild swelling and pain at the right lower leg 08/06/2016-negative for acute deep vein thrombosis, possible chronic thrombus in the right popliteal vein  15. Admission July 2018 with GI bleeding, no source for blood loss found on an upper endoscopy   Disposition: Ms. Gathright remains in clinical remission from breast cancer and lymphoma.  She will return for an office and lab visit in 5 months.  I am available to see her in the interim as needed.  15 minutes were spent with the patient today.  The majority of the time was used for counseling and coordination of care.  Betsy Coder, MD  08/10/2017  12:24 PM

## 2017-08-12 DIAGNOSIS — N3946 Mixed incontinence: Secondary | ICD-10-CM | POA: Diagnosis not present

## 2017-08-12 DIAGNOSIS — R350 Frequency of micturition: Secondary | ICD-10-CM | POA: Diagnosis not present

## 2017-08-13 DIAGNOSIS — M47816 Spondylosis without myelopathy or radiculopathy, lumbar region: Secondary | ICD-10-CM | POA: Diagnosis not present

## 2017-08-17 NOTE — Progress Notes (Signed)
[name] was seen today in the movement disorders clinic for neurologic consultation at the request of Dr. Betsy Coder.    The consultation is for the evaluation of ataxia and tremor.  The patient has been a previous pt of Dr. Erling Cruz.  Dr. Erling Cruz dx the patient with parkinsonism and ET.  This is an 84 y/o female with a complex medical hx of both non Hodgkins lymphoma require chemotherapy and breast CA, s/p bilateral mastectomy.  She has a long hx of tremor.  I reviewed Dr. Bernardo Heater notes that were made available to me.  It appears that she presented with right hand resting tremor in 2009, without associated rigidity or bradykinesia.  There were no known medications causing the problem and it appears that she was initially dx with ET and later transitioned the dx to parkinsonism.  The patient reports no tremor in the L hand.  Her husband reports a little tremor in the R leg.  The patient reports that tremor is most significant with relaxing and she has no tremor if her hands are preoccupied.    She has never been on PD medication or been to PT until yesterday.  03/16/13 update:  The pt presents today with her husband.  She has not been seen since February, 2014 at which time I dx her with PD.  She was started on levodopa 25/100 three times per day.  She did not follow up sooner as she had bronchitis and pneumonia for the last 2 months and is just now recovering.  She did go to PT and liked it but would like to go back.  She is doing well on the levodopa.  Her husband rarely notice tremor any longer.  06/28/13 update:    The pt today presents for f/u with her husband who supplements the hx.  She is on carbidopa/levodopa 25/100 three times per day.  She is doing well but notices R arm tremor now that had gone away.  Rarely she will note tremor in the L hand which she had not noted previously.  She does have some cramping of the feet.  She is unsure if this is associated with wearing off.  She did have some  of this while in my office, and it was time for her to redose the carbidopa/levodopa.  She is now attending therapy, and is enjoying and finding benefit from it.  08/08/12 update:  The patient presents today with her husband, who supplements the history.  The patient has Parkinson's disease and is currently on carbidopa/levodopa 25/100, one tablet 3 times per day.  Last visit, she was describing some cramping in her feet and I was trying to figure out whether or not this was an "off" phenomenon.  After paying attention, the patient called and said that she thought it was and we added entacapone.  The cramping in the feet is better but she is now having a stabbing and shooting pain down the back of the leg and to the lateral side of the lower leg that stops at the ankle.  It has stopped her from exercising.  She sometimes has back pain.  This is fairly inconsistent.  The leg pain seems to start about 4 days after she finished rehabilitation, which did seem to help.  She is not sure if the pain is related to the rehabilitation, however.  10/03/13 update:  The patient presents today with her husband, who supplements the history.  The patient is currently on carbidopa/levodopa 25/100, one  tablet 3 times per day.  She is also on Comtan 200 mg, one tablet 3 times per day.  From a Parkinson's standpoint, she has been doing well.  No falls.  No hallucinations.  Her husband rarely sees tremor, and that is primarily when she is in bed at night and he will feel it rather than see it.  Last visit, she was complaining of back pain that was likely sciatica.  I ordered an EMG but they ultimately canceled that.  I tried her on Lyrica but that caused her to be excessively sleepy.  She went to the chiropractor and states that she is doing much better from that regard.  Unfortunately, she has had bronchitis and a urinary tract infection.  She has been on several antibiotics, which have caused her to be nauseated.  She has also had  dental work and had several of her teeth pulled.  Because of these things, she has had a decreased appetite and really has not been able to eat.  She had a temporary partial placed, but her appetite still hasn't been what it used to be, primarily because of the nausea from the antibiotic.  She has not been exercising because of being sick.  She is hoping to get back to that soon.  02/02/14 update:  The patient presents today with her husband, who supplements the history.  The patient is currently on carbidopa/levodopa 25/100, 1 tablet at 9 AM/1 PM and 2 tablets at 6 PM.   She is also on Comtan 200 mg, one tablet 3 times per day.   The discomfort in the foot is much better.  From a Parkinson's standpoint, she has been doing well.  No falls.  No hallucinations.  The patient was screened through our Parkinson's screening program on 01/16/2014 and was either at baseline or better than baseline at all therapies and is scheduled for re-screening in January.  She is not currently exercising.    06/05/14 update:  The patient presents today with her husband, who supplements the history.  The patient is currently on carbidopa/levodopa 25/100, 1 tablet at 9 AM/1 PM and 2 tablets at 6 PM.   She is also on Comtan 200 mg, one tablet 3 times per day.  No falls.  No lightheadedness or near syncope.  No hallucinations.   I reviewed records since last visit.  Weight loss has been a concern.  The dietician has talked with her about suggestions to help, and the patient states that she was told to take Carnation 3 times per day.  Pt states that the biggest issue that has caused weight loss has been persistent nausea, which has been going on for about 2 months.  They really do not think that it is related to the Parkinson's medications, because it did not start with changes in Parkinson's medications.  It actually seemed to start after a visit to a restaurant and then persisted.  She just feels nauseated, has lost taste for food and  has lost appetite.  She followed up with Dr. Kenton Kingfisher and was started on Zantac about 3 or 4 days ago and does feel better and her blood pressure medication was discontinued.  10/04/14 update:  The patient presents today with her husband, who supplements the history.  The patient is currently on carbidopa/levodopa 25/100, 1 tablet at 9 AM/1 PM and 2 tablets at 6 PM.   She is also on Comtan 200 mg, one tablet 3 times per day. We d/c her comtan  for a week to see if her GI upset changed at all and it did not so we restarted it since obviously that was not the etiology.  She continues to have GI upset in the AM.  She went to her PCP and she was given Zofran and states that it made it worse but she only took it once.  It is only the AM when she has nausea and she does better the rest of the day.    12/14/14 update:  The patient returns today, accompanied by her husband who supplements the history.  Records were reviewed since last visit.  She has a history of breast cancer and was having weight loss/anorexia and saw her oncologist.  She ended up having a CT chest, abdomen and pelvis and those were unremarkable for any new lesions.  She has lost about 13 pounds since March of last year.  She has been complaining about nausea the last several visits.  In fact, we've previously stopped her entacapone to see if that would help the nausea, but it did not.  She did not think that it was related at all to her levodopa.  She no longer has the nausea but in the AM she doesn't feel good.  She has difficulty describing what this means.  She remains on carbidopa/levodopa 25/100, 1 tablet at 9 AM/one tablet at 1 PM and 2 tablets at 6 PM.  She is still on entacapone, 200 mg 3 times a day.  They brought with them a list of blood pressures and while the SBP is generally high (170's-190's without med and 150's-180's with BP med) it was never over 200.  She states that she was off the BP med a few days and felt better but when she went  back on it she felt worse again.  She was told to go back on it because it her BP's were too high.  She is only on amlodipine 2.5 mg.    03/05/15 update:  The patient is following up today regarding her Parkinson's disease.  She is currently on carbidopa/levodopa 25/100, 1 tablet at 9 AM, 1 tablet at 1 PM and 2 tablets at 6 PM.  She takes entacapone with each of these dosages.  At last visit, she was very focused on her blood pressure and I asked her to follow-up with her primary care physician to see if she actually needed her blood pressure medication.  Today, the patient and her husband state that she was able to d/c her amlodipine and her BP has been well controlled. She states that the nausea is better.  She states that she gets up in the AM and takes her med and just feels jittery and anxious.  Her husband asks about trying to take a levodopa at bedtime to see if that helps.  When she takes her xanax, the anxiety does seem to get better.  They were thinking about asking PCP to see if she can go up on the medication.  Husband admits that balance sometimes off and cognition not great.  PT did not think that she needed walker per their hx.  She has gotten back to her exercise.   By 3-4 pm, pt feels well.    06/11/15 update:  The patient presents today for follow-up, accompanied by her husband who supplements the history.  She is on carbidopa/levodopa 25/100, 1 tablet at 9 AM, 1 tablet at 1 PM and 2 tablets at 6 PM.  She is on entacapone  200 mg 3 times a day and last visit we added carbidopa/levodopa 50/200 at night.  She didn't notice a big difference with that. Her husband isn't sure that she really gave it enough time to know.   Her biggest issue last visit was her anxiety and I told her to try an extra levodopa when she is anxious to see if that helps.  They tried that and it didn't help.    I did tell her that I did not want her taking daily Xanax.  Today, the patient states that the "only thing that helps  is xanax."   Unfortunately, she has not been exercising.  Her husband is undergoing tx daily for prostate CA and they are consumed with that.  Balance has been okay and her husband states that she is doing well in that regard.  No falls.  She is sleeping from 9pm until 5-6am. She is taking naps during the day.   09/11/15 update:  The patient follows up today, accompanied by her husband who supplements the history.  She is on carbidopa/levodopa 25/100, 1 tablet at 9 AM, 1 at 1 PM and 2 tablets at 6 PM.  I asked her to retry carbidopa/levodopa 50/200 at night last visit.  She is still on the entacapone 200 mg 3 times per day.  Last visit, I encouraged her to discontinue her daily Xanax and we started her on Wellbutrin XL for her anxiety.  Her husband thinks that she is still on it (and off of xanax) but wellbutrin is not on her medication list today.  Anxiety has been better per both patient and husband.  Husband states that Dr. Kenton Kingfisher told them to ask me about sertraline.  She was going to come in and talk to me on January 11, but ended up having increased hallucinations and was admitted to the hospital from January 11 to 08/11/2015.  She was treated for urinary tract infection although her urine culture ended up being negative.  Husband reports that she is 90-95% better from a mental standpoint.  No hallucinations since d/c from the hospital.  Doing home PT/OT/ST and nurse comes once per week.    01/09/16 update:  The patient follows up today, accompanied by her husband who supplements the history.  She is on carbidopa/levodopa 25/100, 1 tablet at 9 AM, 1 at 1 PM and 2 tablets at 6 PM.  For some reason, her carbidopa/levodopa 50/200 got accidentally stopped, and I told them to just go ahead and hold that for now.  She is still on the entacapone 200 mg 3 times per day.  Last visit, her husband was not sure if she was still on the Wellbutrin and ended up calling me back.  This was stopped during her hospitalization.   She, therefore, started on Zoloft instead right after our last visit.  She took a single 25 mg tablet and felt nauseated, but I reminded her that she feels nauseated a lot in the morning and asked her to retry it, telling her that she could take it before bedtime and then work up to 50 mg at night.  She is only on 25 mg and husband states that she is doing well and anxiety is better.  Only time an issue is that she hates to get in a car and travel.    Nausea is also better.    05/13/16 update:  The patient follows up today, accompanied by her husband who supplements the history.  She is on  carbidopa/levodopa 25/100, 1 tablet at 9 AM, 1 at 1 PM and 2 tablets at 6 PM.  She is on entacapone 200 mg 3 times a day with each of these dosages.  For the anxiety and depression, she is on Zoloft which has been increased to 50 mg daily.  That has helped.  She was hospitalized from 9/18-9/22 for CAP.  The records that were made available to me were reviewed. Having a facet joint injection tomorrow by Dr. Jacelyn Grip for back pain.  Not been able to get back to exercise because of pneumonia and back pain.  Has PD screen on 11/28.  08/19/16 update:  The patient follows up today, accompanied by her husband who supplements the history.  She is on carbidopa/levodopa 25/100, 1 tablet at 9 AM, 1 at 1 PM and 2 tablets at 6 PM.  She is on entacapone 200 mg 3 times a day with each of these dosages.  For the anxiety and depression, she is on Zoloft which has been increased to 50 mg daily.  Husband states that "she is not as happy as the gal I married."  The records that were made available to me were reviewed.  Pt denies falls.  Pt denies lightheadedness, near syncope.  No hallucinations.  Husband states that yesterday was a really bad day both physically and mentally (more confused and had help getting dressed).  Is much better today.  Appetite is overall down.  Drinking plenty of water.  Not exercising.  Is sleeping a lot per husband.  Husband states that was having back pain.  Went and had rhizotomy and that helped and then PT ordered but husband wants it at neurorehab center as patient very unsteady.  11/24/16 update: Patient seen today, accompanied by her husband to supplements the history.  Patient remains on carbidopa/levodopa 25/100, one tablet at 1 tablet at 9am, 1 PM and 2 tablets at 6 PM.  She takes entacapone 200 mg 3 times per day.  She is on sertraline, 50 mg daily.  She still suffers from rather significant anxiety.  She had one fall but they aren't sure if since last visit but thinks it was.  No hallucinations.  She has been attending outpatient Parkinson's therapies and I have reviewed those records.    04/06/17 update:  Pt seen in f/u for parkinsonism.  This patient is accompanied in the office by her spouse who supplements the history.  On carbidopa/levodopa 25/100, 1/1/2 and comtan 200 mg with each dosage.  Still on zoloft, 50 mg for anxiety, which continues to be problematic.  The records that were made available to me were reviewed.  Completed therapies since our last visit.  Also hospitalized in July for hematochezia with associated significant anemia, requiring transfusion.  EGD was negative.  Bleed was suspected lower GI in nature, likely from diverticular disease.  No falls.  Little off balance.  No hallucinations.  Memory is getting worse.  Some trouble at times knowing how to get in and out of bed.  08/18/17 update: Patient seen today in follow-up for parkinsonism and dementia.  She is accompanied by her husband who supplements history.  She is on carbidopa/levodopa 25/100, 1/1/2 and entacapone with each dosage.  Last visit, we started donepezil.  She has tolerated that medication well.  No diarrhea. She c/o "forgetting a lot."   Remains on sertraline for anxiety.  Her husband called since last visit and stated that occasionally the patient feels that she is choking when she eats.  Modified barium swallow was completed.   It was essentially unremarkable and regular diet with thin liquids was recommended.  Reviewed records since last visit.  She saw oncology.  She remains in remission from her non-Hodgkin's lymphoma and breast cancer.  She is still having back pain and called dr. Jacelyn Grip and states that she is to have "another procedure" to help her back.  Went to [facility]rology and going to start bladder stim.  Asks me about this.  Some trouble sleeping - "I don't want a pill."  Neuroimaging has  previously been performed.  An MRI brain was last done in 2010 but pt can no longer have MRI's because of an implantable hearing device.    PREVIOUS MEDICATIONS: none to date  ALLERGIES:  No Known Allergies  CURRENT MEDICATIONS:  Current Outpatient Medications on File Prior to Visit  Medication Sig Dispense Refill  . acetaminophen (TYLENOL) 325 MG tablet Take 650 mg by mouth every 6 (six) hours as needed for mild pain, moderate pain, fever or headache.     [name] acyclovir (ZOVIRAX) 400 MG tablet Take 1 tablet (400 mg total) by mouth 2 (two) times daily. 120 tablet 0  . bisacodyl (DULCOLAX) 5 MG EC tablet Take 5 mg by mouth daily as needed for moderate constipation.    . Calcium Carbonate-Vitamin D3 (CALCIUM 600-D) 600-400 MG-UNIT TABS Take 1 tablet by mouth at bedtime.    . carbidopa-levodopa (SINEMET IR) 25-100 MG tablet TAKE 1 TABLET BY MOUTH DAILY IN THE MORNING, 1 IN THE AFTERNOON, AND 2 IN THE EVENING 360 tablet 0  . cetirizine (ZYRTEC) 10 MG tablet Take 10 mg by mouth daily at 12 noon.     . cholecalciferol (VITAMIN D) 1000 units tablet Take 1,000 Units by mouth every evening.     . donepezil (ARICEPT) 10 MG tablet Take 1 tablet (10 mg total) by mouth at bedtime. 90 tablet 1  . entacapone (COMTAN) 200 MG tablet TAKE 1 TABLET BY MOUTH 3 TIMES DAILY WITH SINEMET 90 tablet 0  . Fluticasone-Salmeterol (ADVAIR) 250-50 MCG/DOSE AEPB Inhale 1 puff into the lungs 2 (two) times daily.    . mirabegron ER (MYRBETRIQ) 50 MG TB24  tablet Take 50 mg by mouth daily at 12 noon.     . Multiple Vitamins-Minerals (MULTIVITAMIN WITH MINERALS) tablet Take 1 tablet by mouth daily with breakfast.     . sertraline (ZOLOFT) 50 MG tablet Take 50 mg by mouth daily.     No current facility-administered medications on file prior to visit.     PAST MEDICAL HISTORY:   Past Medical History:  Diagnosis Date  . Anxiety   . Asthma   . Depression   . Diabetes mellitus without complication ([place])    3/55/73.Marland KitchenMarland Kitchenpt denies  . GERD (gastroesophageal reflux disease)   . Hearing loss   . NHL (non-Hodgkin's lymphoma) ([place])    nhl dx 9/04 breast ca dx1/12  . Parkinson disease ([place])     PAST SURGICAL HISTORY:   Past Surgical History:  Procedure Laterality Date  . Ba-HA Ear implant    . ESOPHAGOGASTRODUODENOSCOPY N/A 02/04/2017   Procedure: ESOPHAGOGASTRODUODENOSCOPY (EGD);  Surgeon: Arta Silence, MD;  Location: [facility];  Service: Endoscopy;  Laterality: N/A;  . MASTECTOMY  2 /8/ 12   bilateral  . MASTECTOMY      SOCIAL HISTORY:   Social History   Socioeconomic History  . Marital status: Married    Spouse name: Not on file  . Number of children: Not  on file  . Years of education: Not on file  . Highest education level: Not on file  Social Needs  . Financial resource strain: Not on file  . Food insecurity - worry: Not on file  . Food insecurity - inability: Not on file  . Transportation needs - medical: Not on file  . Transportation needs - non-medical: Not on file  Occupational History  . Occupation: retired    Fish farm manager: RETIRED    Comment: [employer]  Tobacco Use  . Smoking status: Never Smoker  . Smokeless tobacco: Never Used  . Tobacco comment: husband wsa smoker  Substance and Sexual Activity  . Alcohol use: No    Comment: no  . Drug use: No  . Sexual activity: Not on file  Other Topics Concern  . Not on file  Social History Narrative  . Not on file    FAMILY HISTORY:   Family Status  Relation  Name Status  . Father  Deceased       CA, lung  . Mother  Deceased       CA; complications of splenectomy  . Brother  Deceased       3, renal failure, DM-2; CAD; CA  . Child  Alive       5 (4 biological), one with PKU    ROS:  A complete 10 system review of systems was obtained and was unremarkable apart from what is mentioned above.  PHYSICAL EXAMINATION:    VITALS:   Vitals:   08/18/17 1117  BP: (!) 144/70  Pulse: 74  SpO2: 96%  Weight: 125 lb (56.7 kg)  Height: 5\' 3"  (1.6 m)   Wt Readings from Last 3 Encounters:  08/18/17 125 lb (56.7 kg)  08/10/17 126 lb (57.2 kg)  04/13/17 129 lb 9.6 oz (58.8 kg)     GEN:  The patient appears stated age and is in NAD. CV:  RRR with 3/6 SEM Lungs:  CTAB Neck:  No bruits but cardiac murmur radiates to the bilateral carotids   Neurological examination:  Orientation: She is alert to person and place but not time.  Doesn't remember that she has a dx of dementia when asked. Montreal Cognitive Assessment  05/13/2016 09/11/2015 12/14/2014  Visuospatial/ Executive (0/5) 3 3 3   Naming (0/3) 2 3 3   Attention: Read list of digits (0/2) 2 1 2   Attention: Read list of letters (0/1) 1 1 1   Attention: Serial 7 subtraction starting at 100 (0/3) 0 0 0  Language: Repeat phrase (0/2) 1 0 1  Language : Fluency (0/1) 1 1 1   Abstraction (0/2) 1 1 1   Delayed Recall (0/5) 0 0 0  Orientation (0/6) 5 6 6   Total 16 16 18   Adjusted Score (based on education) 17 17 19    Cranial nerves: There is good facial symmetry.  The visual fields are full to confrontational testing. The speech is fluent and clear.  She is hypophonic.  Soft palate rises symmetrically and there is no tongue deviation. Hearing is intact to conversational tone. Sensation: Sensation is intact to light outh throughout. Motor: Strength is 5/5 in the bilateral upper and lower extremities.   Shoulder shrug is equal and symmetric.  There is no pronator drift.   Movement examination: Tone:  There is no rigidity today Abnormal movements: There is rare tremor in RUE/L fingers. Coordination:  There is no decremation noted today Gait and Station: The patient pushes off of the chair and has some trouble because of  back pain.  Stride length and arm swing are good.  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  She walks with an antalgic gait.    Labs:  Lab Results  Component Value Date   WBC 4.7 08/10/2017   HGB 12.2 08/10/2017   HCT 36.2 08/10/2017   MCV 90.5 08/10/2017   PLT 150 08/10/2017     Chemistry      Component Value Date/Time   NA 140 08/10/2017 1149   NA 143 04/13/2017 1155   K 4.4 08/10/2017 1149   K 4.1 04/13/2017 1155   CL 104 08/10/2017 1149   CL 106 12/26/2012 1110   CO2 28 08/10/2017 1149   CO2 27 04/13/2017 1155   BUN 15 08/10/2017 1149   BUN 19.5 04/13/2017 1155   CREATININE 1.31 (H) 08/10/2017 1149   CREATININE 1.2 (H) 04/13/2017 1155      Component Value Date/Time   CALCIUM 9.4 08/10/2017 1149   CALCIUM 9.6 04/13/2017 1155   ALKPHOS 102 08/10/2017 1149   ALKPHOS 97 04/13/2017 1155   AST 19 08/10/2017 1149   AST 20 04/13/2017 1155   ALT 11 08/10/2017 1149   ALT 7 04/13/2017 1155   BILITOT 0.6 08/10/2017 1149   BILITOT 0.31 04/13/2017 1155      Lab Results  Component Value Date   VITAMINB12 1053 (H) 06/10/2006   Lab Results  Component Value Date   TSH 2.93 09/19/2012    ASSESSMENT/PLAN:  1.  Idiopathic Parkinson's disease, dx 08/2012.    -We discussed the diagnosis as well as pathophysiology of the disease.  We discussed treatment options as well as prognostic indicators.  Patient education was provided.  - she is currently on carbidopa/levodopa 25/100 one tablet at 9 AM/one tablet at 1 PM and 2 tablets at 6 PM and will continue this.  She needs to avoid protein when taking levodopa and discussed this.  Risks, benefits, side effects and alternative therapies were discussed.  The opportunity to ask questions was given and they were answered to  the best of my ability.  The patient expressed understanding and willingness to follow the outlined treatment protocols.  -Pt is on entacapone tid and she will continue on this.    -was supposed to restart carbidopa/levodopa 50/200 q hs previously but didn't and decided to hold on that for now.  -take pills in applesauce due to feeling that she has trouble swallowing.  MBE 05/2017 was fairly unremarkable.  -add melatonin for sleep, 3 mg daily  -information given on drumming class 2.  Anxiety  -she is still on sertraline 50 mg daily 3.  Cardiac murmur  -being followed yearly by cardiology for mod AS 4.  PDD  -tolerating aricept, 10 mg daily.  continue  - Is free of hallucinations but if occur again, will consider nuplazid.   -reminded her again about not sitting and watching TV.  Talked about nature and etiology of dementia.  Talked about regular schedule, playing games, keeping active.   5. Urinary incontinence  -seeing [facility] for neurostim.  Reassured her that this is okay with PD 6.  Follow up is anticipated in the next few months, sooner should new neurologic issues arise.  Much greater than 50% of this visit was spent in counseling and coordinating care.  Total face to face time:  40 min.  Many questions asked today by patient/husband and answered them to best of my ability.

## 2017-08-18 ENCOUNTER — Ambulatory Visit (INDEPENDENT_AMBULATORY_CARE_PROVIDER_SITE_OTHER): Payer: Medicare Other | Admitting: Neurology

## 2017-08-18 ENCOUNTER — Encounter: Payer: Self-pay | Admitting: Neurology

## 2017-08-18 VITALS — BP 144/70 | HR 74 | Ht 63.0 in | Wt 125.0 lb

## 2017-08-18 DIAGNOSIS — F0280 Dementia in other diseases classified elsewhere without behavioral disturbance: Secondary | ICD-10-CM

## 2017-08-18 DIAGNOSIS — R32 Unspecified urinary incontinence: Secondary | ICD-10-CM

## 2017-08-18 DIAGNOSIS — G20 Parkinson's disease: Secondary | ICD-10-CM | POA: Diagnosis not present

## 2017-08-18 DIAGNOSIS — R1319 Other dysphagia: Secondary | ICD-10-CM | POA: Diagnosis not present

## 2017-08-18 NOTE — Patient Instructions (Signed)
Add melatonin, 3 mg nightly for sleep Get a regular schedule and follow it daily I want you to play card games and keep your brain ACTIVE!  Exercise!

## 2017-08-24 DIAGNOSIS — L820 Inflamed seborrheic keratosis: Secondary | ICD-10-CM | POA: Diagnosis not present

## 2017-09-01 DIAGNOSIS — M47816 Spondylosis without myelopathy or radiculopathy, lumbar region: Secondary | ICD-10-CM | POA: Diagnosis not present

## 2017-09-07 DIAGNOSIS — J209 Acute bronchitis, unspecified: Secondary | ICD-10-CM | POA: Diagnosis not present

## 2017-09-07 DIAGNOSIS — R05 Cough: Secondary | ICD-10-CM | POA: Diagnosis not present

## 2017-09-25 ENCOUNTER — Other Ambulatory Visit: Payer: Self-pay | Admitting: Oncology

## 2017-09-28 DIAGNOSIS — M47816 Spondylosis without myelopathy or radiculopathy, lumbar region: Secondary | ICD-10-CM | POA: Diagnosis not present

## 2017-10-01 DIAGNOSIS — R350 Frequency of micturition: Secondary | ICD-10-CM | POA: Diagnosis not present

## 2017-10-05 DIAGNOSIS — G8929 Other chronic pain: Secondary | ICD-10-CM | POA: Diagnosis not present

## 2017-10-05 DIAGNOSIS — K219 Gastro-esophageal reflux disease without esophagitis: Secondary | ICD-10-CM | POA: Diagnosis not present

## 2017-10-05 DIAGNOSIS — Z853 Personal history of malignant neoplasm of breast: Secondary | ICD-10-CM | POA: Diagnosis not present

## 2017-10-05 DIAGNOSIS — M545 Low back pain: Secondary | ICD-10-CM | POA: Diagnosis not present

## 2017-10-05 DIAGNOSIS — R110 Nausea: Secondary | ICD-10-CM | POA: Diagnosis not present

## 2017-10-05 DIAGNOSIS — N183 Chronic kidney disease, stage 3 (moderate): Secondary | ICD-10-CM | POA: Diagnosis not present

## 2017-10-05 DIAGNOSIS — G20 Parkinson's disease: Secondary | ICD-10-CM | POA: Diagnosis not present

## 2017-10-05 DIAGNOSIS — F419 Anxiety disorder, unspecified: Secondary | ICD-10-CM | POA: Diagnosis not present

## 2017-10-05 DIAGNOSIS — N3281 Overactive bladder: Secondary | ICD-10-CM | POA: Diagnosis not present

## 2017-10-05 DIAGNOSIS — Z8572 Personal history of non-Hodgkin lymphomas: Secondary | ICD-10-CM | POA: Diagnosis not present

## 2017-10-08 DIAGNOSIS — R350 Frequency of micturition: Secondary | ICD-10-CM | POA: Diagnosis not present

## 2017-10-09 ENCOUNTER — Other Ambulatory Visit: Payer: Self-pay | Admitting: Neurology

## 2017-10-12 DIAGNOSIS — H90A31 Mixed conductive and sensorineural hearing loss, unilateral, right ear with restricted hearing on the contralateral side: Secondary | ICD-10-CM | POA: Diagnosis not present

## 2017-10-14 ENCOUNTER — Other Ambulatory Visit: Payer: Self-pay

## 2017-10-15 DIAGNOSIS — R350 Frequency of micturition: Secondary | ICD-10-CM | POA: Diagnosis not present

## 2017-10-22 DIAGNOSIS — N3946 Mixed incontinence: Secondary | ICD-10-CM | POA: Diagnosis not present

## 2017-10-25 ENCOUNTER — Other Ambulatory Visit: Payer: Self-pay | Admitting: Neurology

## 2017-10-28 ENCOUNTER — Other Ambulatory Visit: Payer: Self-pay | Admitting: Gastroenterology

## 2017-10-28 DIAGNOSIS — G43A0 Cyclical vomiting, not intractable: Secondary | ICD-10-CM

## 2017-10-28 DIAGNOSIS — K219 Gastro-esophageal reflux disease without esophagitis: Secondary | ICD-10-CM | POA: Diagnosis not present

## 2017-10-28 DIAGNOSIS — R1084 Generalized abdominal pain: Secondary | ICD-10-CM | POA: Diagnosis not present

## 2017-10-28 DIAGNOSIS — R112 Nausea with vomiting, unspecified: Secondary | ICD-10-CM | POA: Diagnosis not present

## 2017-10-29 DIAGNOSIS — R350 Frequency of micturition: Secondary | ICD-10-CM | POA: Diagnosis not present

## 2017-11-04 DIAGNOSIS — M47816 Spondylosis without myelopathy or radiculopathy, lumbar region: Secondary | ICD-10-CM | POA: Diagnosis not present

## 2017-11-05 DIAGNOSIS — R350 Frequency of micturition: Secondary | ICD-10-CM | POA: Diagnosis not present

## 2017-11-10 ENCOUNTER — Ambulatory Visit
Admission: RE | Admit: 2017-11-10 | Discharge: 2017-11-10 | Disposition: A | Discharge status: Home / Self Care | Payer: Medicare Other | Source: Ambulatory Visit | Attending: Gastroenterology | Admitting: Gastroenterology

## 2017-11-10 DIAGNOSIS — G43A0 Cyclical vomiting, not intractable: Secondary | ICD-10-CM

## 2017-11-10 DIAGNOSIS — R1900 Intra-abdominal and pelvic swelling, mass and lump, unspecified site: Secondary | ICD-10-CM | POA: Diagnosis not present

## 2017-11-10 DIAGNOSIS — R1084 Generalized abdominal pain: Secondary | ICD-10-CM

## 2017-11-10 MED ORDER — IOPAMIDOL (ISOVUE-300) INJECTION 61%
80.0000 mL | Freq: Once | INTRAVENOUS | Status: AC | PRN
  Administered 2017-11-10: 80 mL via INTRAVENOUS

## 2017-11-11 DIAGNOSIS — R350 Frequency of micturition: Secondary | ICD-10-CM | POA: Diagnosis not present

## 2017-11-19 DIAGNOSIS — M545 Low back pain: Secondary | ICD-10-CM | POA: Diagnosis not present

## 2017-11-19 DIAGNOSIS — G8929 Other chronic pain: Secondary | ICD-10-CM | POA: Diagnosis not present

## 2017-11-19 DIAGNOSIS — R05 Cough: Secondary | ICD-10-CM | POA: Diagnosis not present

## 2017-11-25 DIAGNOSIS — M545 Low back pain: Secondary | ICD-10-CM | POA: Diagnosis not present

## 2017-11-25 DIAGNOSIS — R05 Cough: Secondary | ICD-10-CM | POA: Diagnosis not present

## 2017-11-25 DIAGNOSIS — W010XXA Fall on same level from slipping, tripping and stumbling without subsequent striking against object, initial encounter: Secondary | ICD-10-CM | POA: Diagnosis not present

## 2017-12-02 DIAGNOSIS — R350 Frequency of micturition: Secondary | ICD-10-CM | POA: Diagnosis not present

## 2017-12-09 DIAGNOSIS — R112 Nausea with vomiting, unspecified: Secondary | ICD-10-CM | POA: Diagnosis not present

## 2017-12-09 DIAGNOSIS — R1084 Generalized abdominal pain: Secondary | ICD-10-CM | POA: Diagnosis not present

## 2017-12-09 DIAGNOSIS — K2900 Acute gastritis without bleeding: Secondary | ICD-10-CM | POA: Diagnosis not present

## 2017-12-09 DIAGNOSIS — K219 Gastro-esophageal reflux disease without esophagitis: Secondary | ICD-10-CM | POA: Diagnosis not present

## 2017-12-10 DIAGNOSIS — R350 Frequency of micturition: Secondary | ICD-10-CM | POA: Diagnosis not present

## 2017-12-14 DIAGNOSIS — R413 Other amnesia: Secondary | ICD-10-CM | POA: Diagnosis not present

## 2017-12-14 DIAGNOSIS — K219 Gastro-esophageal reflux disease without esophagitis: Secondary | ICD-10-CM | POA: Diagnosis not present

## 2017-12-14 DIAGNOSIS — F419 Anxiety disorder, unspecified: Secondary | ICD-10-CM | POA: Diagnosis not present

## 2017-12-14 DIAGNOSIS — E7800 Pure hypercholesterolemia, unspecified: Secondary | ICD-10-CM | POA: Diagnosis not present

## 2017-12-14 DIAGNOSIS — N3281 Overactive bladder: Secondary | ICD-10-CM | POA: Diagnosis not present

## 2017-12-14 DIAGNOSIS — Z8572 Personal history of non-Hodgkin lymphomas: Secondary | ICD-10-CM | POA: Diagnosis not present

## 2017-12-14 DIAGNOSIS — N183 Chronic kidney disease, stage 3 (moderate): Secondary | ICD-10-CM | POA: Diagnosis not present

## 2017-12-14 DIAGNOSIS — I10 Essential (primary) hypertension: Secondary | ICD-10-CM | POA: Diagnosis not present

## 2017-12-14 DIAGNOSIS — G20 Parkinson's disease: Secondary | ICD-10-CM | POA: Diagnosis not present

## 2017-12-14 DIAGNOSIS — M545 Low back pain: Secondary | ICD-10-CM | POA: Diagnosis not present

## 2017-12-14 DIAGNOSIS — Z853 Personal history of malignant neoplasm of breast: Secondary | ICD-10-CM | POA: Diagnosis not present

## 2017-12-16 ENCOUNTER — Other Ambulatory Visit: Payer: Self-pay | Admitting: Neurology

## 2017-12-17 DIAGNOSIS — R350 Frequency of micturition: Secondary | ICD-10-CM | POA: Diagnosis not present

## 2017-12-22 DIAGNOSIS — M4150 Other secondary scoliosis, site unspecified: Secondary | ICD-10-CM | POA: Diagnosis not present

## 2017-12-24 ENCOUNTER — Encounter

## 2017-12-24 DIAGNOSIS — R350 Frequency of micturition: Secondary | ICD-10-CM | POA: Diagnosis not present

## 2017-12-31 DIAGNOSIS — M5116 Intervertebral disc disorders with radiculopathy, lumbar region: Secondary | ICD-10-CM | POA: Diagnosis not present

## 2017-12-31 DIAGNOSIS — M5416 Radiculopathy, lumbar region: Secondary | ICD-10-CM | POA: Diagnosis not present

## 2017-12-31 DIAGNOSIS — M4186 Other forms of scoliosis, lumbar region: Secondary | ICD-10-CM | POA: Diagnosis not present

## 2017-12-31 DIAGNOSIS — R350 Frequency of micturition: Secondary | ICD-10-CM | POA: Diagnosis not present

## 2018-01-03 ENCOUNTER — Inpatient Hospital Stay (HOSPITAL_BASED_OUTPATIENT_CLINIC_OR_DEPARTMENT_OTHER): Payer: Medicare Other | Admitting: Oncology

## 2018-01-03 ENCOUNTER — Encounter: Payer: Self-pay | Admitting: Oncology

## 2018-01-03 ENCOUNTER — Telehealth: Payer: Self-pay | Admitting: Oncology

## 2018-01-03 ENCOUNTER — Inpatient Hospital Stay: Payer: Medicare Other | Attending: Oncology

## 2018-01-03 VITALS — BP 180/81 | HR 66 | Temp 98.2°F | Resp 18 | Ht 63.0 in | Wt 117.0 lb

## 2018-01-03 DIAGNOSIS — Z862 Personal history of diseases of the blood and blood-forming organs and certain disorders involving the immune mechanism: Secondary | ICD-10-CM | POA: Insufficient documentation

## 2018-01-03 DIAGNOSIS — Z9013 Acquired absence of bilateral breasts and nipples: Secondary | ICD-10-CM

## 2018-01-03 DIAGNOSIS — Z8572 Personal history of non-Hodgkin lymphomas: Secondary | ICD-10-CM | POA: Insufficient documentation

## 2018-01-03 DIAGNOSIS — R630 Anorexia: Secondary | ICD-10-CM | POA: Insufficient documentation

## 2018-01-03 DIAGNOSIS — I10 Essential (primary) hypertension: Secondary | ICD-10-CM | POA: Diagnosis not present

## 2018-01-03 DIAGNOSIS — G20 Parkinson's disease: Secondary | ICD-10-CM | POA: Diagnosis not present

## 2018-01-03 DIAGNOSIS — C8590 Non-Hodgkin lymphoma, unspecified, unspecified site: Secondary | ICD-10-CM

## 2018-01-03 DIAGNOSIS — R0600 Dyspnea, unspecified: Secondary | ICD-10-CM | POA: Diagnosis not present

## 2018-01-03 DIAGNOSIS — Z853 Personal history of malignant neoplasm of breast: Secondary | ICD-10-CM | POA: Diagnosis not present

## 2018-01-03 DIAGNOSIS — Z9221 Personal history of antineoplastic chemotherapy: Secondary | ICD-10-CM | POA: Insufficient documentation

## 2018-01-03 DIAGNOSIS — M549 Dorsalgia, unspecified: Secondary | ICD-10-CM

## 2018-01-03 LAB — CBC WITH DIFFERENTIAL (CANCER CENTER ONLY)
BASOS ABS: 0 10*3/uL (ref 0.0–0.1)
BASOS PCT: 0 %
Eosinophils Absolute: 0 10*3/uL (ref 0.0–0.5)
Eosinophils Relative: 0 %
HCT: 35.2 % (ref 34.8–46.6)
HEMOGLOBIN: 11.8 g/dL (ref 11.6–15.9)
Lymphocytes Relative: 12 %
Lymphs Abs: 0.7 10*3/uL — ABNORMAL LOW (ref 0.9–3.3)
MCH: 29.7 pg (ref 25.1–34.0)
MCHC: 33.5 g/dL (ref 31.5–36.0)
MCV: 88.7 fL (ref 79.5–101.0)
Monocytes Absolute: 0.5 10*3/uL (ref 0.1–0.9)
Monocytes Relative: 8 %
Neutro Abs: 4.7 10*3/uL (ref 1.5–6.5)
Neutrophils Relative %: 80 %
Platelet Count: 158 10*3/uL (ref 145–400)
RBC: 3.97 MIL/uL (ref 3.70–5.45)
RDW: 14.1 % (ref 11.2–14.5)
WBC: 6 10*3/uL (ref 3.9–10.3)

## 2018-01-03 LAB — CMP (CANCER CENTER ONLY)
ALK PHOS: 100 U/L (ref 40–150)
AST: 20 U/L (ref 5–34)
Albumin: 4.1 g/dL (ref 3.5–5.0)
Anion gap: 11 (ref 3–11)
BUN: 22 mg/dL (ref 7–26)
CALCIUM: 9.5 mg/dL (ref 8.4–10.4)
CO2: 24 mmol/L (ref 22–29)
CREATININE: 1.18 mg/dL — AB (ref 0.60–1.10)
Chloride: 105 mmol/L (ref 98–109)
GFR, EST NON AFRICAN AMERICAN: 40 mL/min — AB (ref >60)
GFR, Est AFR Am: 46 mL/min — ABNORMAL LOW (ref >60)
Glucose, Bld: 101 mg/dL (ref 70–140)
Potassium: 3.7 mmol/L (ref 3.5–5.1)
Sodium: 140 mmol/L (ref 136–145)
Total Bilirubin: 0.4 mg/dL (ref 0.2–1.2)
Total Protein: 6.9 g/dL (ref 6.4–8.3)

## 2018-01-03 NOTE — Telephone Encounter (Signed)
Appointments scheduled AVS/Calendar printed per 6/10 los °

## 2018-01-03 NOTE — Progress Notes (Signed)
[facility] OFFICE PROGRESS NOTE   Diagnosis: Non-Hodgkin's lymphoma, breast cancer  INTERVAL HISTORY:   [name] returns as scheduled.  Her chief complaint is back pain.  She underwent a lumbar steroid injection by Dr. Ellene Route on 12/31/2017.  She reports partial improvement in the pain.  She complains of dyspnea today.  Her appetite is diminished.  Objective:  Vital signs in last 24 hours:  Blood pressure (!) 180/81, pulse 66, temperature 98.2 F (36.8 C), temperature source Oral, resp. rate 18, height 5' 3"  (1.6 m), weight 117 lb (53.1 kg), SpO2 100 %.    HEENT: Neck without mass Lymphatics: No cervical, supraclavicular, axillary, or inguinal nodes Resp: Lungs clear bilaterally, dyspnea with ambulation to the examination table Cardio: Regular rate and rhythm, 2/6 systolic murmur GI: No hepatosplenomegaly, no mass Vascular: No leg edema Breast: Status post bilateral mastectomy.  No evidence for chest wall tumor recurrence.  Portacath/PICC-without erythema  Lab Results:  Lab Results  Component Value Date   WBC 6.0 01/03/2018   HGB 11.8 01/03/2018   HCT 35.2 01/03/2018   MCV 88.7 01/03/2018   PLT 158 01/03/2018   NEUTROABS 4.7 01/03/2018    CMP  Lab Results  Component Value Date   NA 140 01/03/2018   K 3.7 01/03/2018   CL 105 01/03/2018   CO2 24 01/03/2018   GLUCOSE 101 01/03/2018   BUN 22 01/03/2018   CREATININE 1.18 (H) 01/03/2018   CALCIUM 9.5 01/03/2018   PROT 6.9 01/03/2018   ALBUMIN 4.1 01/03/2018   AST 20 01/03/2018   ALT <6 01/03/2018   ALKPHOS 100 01/03/2018   BILITOT 0.4 01/03/2018   GFRNONAA 40 (L) 01/03/2018   GFRAA 46 (L) 01/03/2018     Medications: I have reviewed the patient's current medications.   Assessment/Plan: 1. Non-Hodgkin lymphoma treated with fludarabine/rituximab, last in July of 2009.  1. Restaging CT 11/24/2010 revealed evidence of progressive lymphoma in the abdomen and pelvis.  2. Initiation of  salvage therapy with bendamustine/rituximab 12/04/2010. She completed 3 cycles.  3. Restaging CT 03/03/2011 revealed stable retroperitoneal soft tissue and a marked decrease in the soft tissue thickening associated with dilated loops of small bowel. 2. History of ITP.  3. History of Herpes zoster-maintained on prophylactic acyclovir.  4. History of anemia secondary to non-Hodgkin lymphoma, chemotherapy, and a history of autoimmune hemolysis.  5. Hypertension. 6. Right-hand tremor/ataxia-followed by neurology, now taking Sinemet, diagnosed with Parkinson's disease 7. Hearing loss-status post placement of an implanted hearing device by Dr. Cresenciano Lick. 8. Bilateral breast cancer, right breast cancer-grade 1, T1 N0, ER/PR positive, and HER2 negative. Left breast cancer, synchronous grade 2, T2 N0, ER/PR positive, and HER2 negative. She began adjuvant Arimidex on 09/15/2010,completed February 2017 9. Inflammatory changes of both lungs on a CT of the chest 11/24/2010-progressive on the restaging CT 03/03/2011, status post a bronchoscopy 03/17/2011 with no evidence of granulomata or tumor. 10. 11 mm spiculated lesion in the lingula on a CT 11/24/2010-less distinct on the CT 03/03/2011. 11. Upper respiratory infection while visiting her son in [place], June 2013, resolved after treatment with Levaquin/steroids 12. Upper respiratory infection,? Pneumonia summer 2014-chest x-ray 02/17/2013 with bilateral infiltrates 13. CT chest abdomen and pelvis on 11/29/2014 with no evidence of active lymphoma or metastatic breast cancer. Previously demonstrated retroperitoneal process appeared improved with probable residual fibrosis. No discrete adenopathy. Interval near complete resolution of patchy airspace opacities in both lungs. 14. Mild swelling and pain at the right lower leg 08/06/2016-negative for  acute deep vein thrombosis, possible chronic thrombus in the right popliteal vein 15. Admission July 2018 with GI  bleeding, no source for blood loss found on an upper endoscopy  Disposition: [name] remains in clinical remission from breast cancer and non-Hodgkin's lymphoma.  Hemoglobin is in the low normal range.  She has multiple comorbid conditions including Parkinson's disease, degenerative spine disease, and chronic respiratory symptoms.  She has lost weight over the past year.  I doubt this is related to recurrence of lymphoma or breast cancer.  [name] will return for an office visit in 2 months.  15 minutes were spent with the patient today.  The majority of the time was used for counseling and coordination of care.  Betsy Coder, MD  01/03/2018  12:32 PM

## 2018-01-17 NOTE — Progress Notes (Signed)
[name] was seen today in the movement disorders clinic for neurologic consultation at the request of Dr. Betsy Coder.    The consultation is for the evaluation of ataxia and tremor.  The patient has been a previous pt of Dr. Erling Cruz.  Dr. Erling Cruz dx the patient with parkinsonism and ET.  This is an 84 y/o female with a complex medical hx of both non Hodgkins lymphoma require chemotherapy and breast CA, s/p bilateral mastectomy.  She has a long hx of tremor.  I reviewed Dr. Bernardo Heater notes that were made available to me.  It appears that she presented with right hand resting tremor in 2009, without associated rigidity or bradykinesia.  There were no known medications causing the problem and it appears that she was initially dx with ET and later transitioned the dx to parkinsonism.  The patient reports no tremor in the L hand.  Her husband reports a little tremor in the R leg.  The patient reports that tremor is most significant with relaxing and she has no tremor if her hands are preoccupied.    She has never been on PD medication or been to PT until yesterday.  03/16/13 update:  The pt presents today with her husband.  She has not been seen since February, 2014 at which time I dx her with PD.  She was started on levodopa 25/100 three times per day.  She did not follow up sooner as she had bronchitis and pneumonia for the last 2 months and is just now recovering.  She did go to PT and liked it but would like to go back.  She is doing well on the levodopa.  Her husband rarely notice tremor any longer.  06/28/13 update:    The pt today presents for f/u with her husband who supplements the hx.  She is on carbidopa/levodopa 25/100 three times per day.  She is doing well but notices R arm tremor now that had gone away.  Rarely she will note tremor in the L hand which she had not noted previously.  She does have some cramping of the feet.  She is unsure if this is associated with wearing off.  She did have some  of this while in my office, and it was time for her to redose the carbidopa/levodopa.  She is now attending therapy, and is enjoying and finding benefit from it.  08/08/12 update:  The patient presents today with her husband, who supplements the history.  The patient has Parkinson's disease and is currently on carbidopa/levodopa 25/100, one tablet 3 times per day.  Last visit, she was describing some cramping in her feet and I was trying to figure out whether or not this was an "off" phenomenon.  After paying attention, the patient called and said that she thought it was and we added entacapone.  The cramping in the feet is better but she is now having a stabbing and shooting pain down the back of the leg and to the lateral side of the lower leg that stops at the ankle.  It has stopped her from exercising.  She sometimes has back pain.  This is fairly inconsistent.  The leg pain seems to start about 4 days after she finished rehabilitation, which did seem to help.  She is not sure if the pain is related to the rehabilitation, however.  10/03/13 update:  The patient presents today with her husband, who supplements the history.  The patient is currently on carbidopa/levodopa 25/100, one  tablet 3 times per day.  She is also on Comtan 200 mg, one tablet 3 times per day.  From a Parkinson's standpoint, she has been doing well.  No falls.  No hallucinations.  Her husband rarely sees tremor, and that is primarily when she is in bed at night and he will feel it rather than see it.  Last visit, she was complaining of back pain that was likely sciatica.  I ordered an EMG but they ultimately canceled that.  I tried her on Lyrica but that caused her to be excessively sleepy.  She went to the chiropractor and states that she is doing much better from that regard.  Unfortunately, she has had bronchitis and a urinary tract infection.  She has been on several antibiotics, which have caused her to be nauseated.  She has also had  dental work and had several of her teeth pulled.  Because of these things, she has had a decreased appetite and really has not been able to eat.  She had a temporary partial placed, but her appetite still hasn't been what it used to be, primarily because of the nausea from the antibiotic.  She has not been exercising because of being sick.  She is hoping to get back to that soon.  02/02/14 update:  The patient presents today with her husband, who supplements the history.  The patient is currently on carbidopa/levodopa 25/100, 1 tablet at 9 AM/1 PM and 2 tablets at 6 PM.   She is also on Comtan 200 mg, one tablet 3 times per day.   The discomfort in the foot is much better.  From a Parkinson's standpoint, she has been doing well.  No falls.  No hallucinations.  The patient was screened through our Parkinson's screening program on 01/16/2014 and was either at baseline or better than baseline at all therapies and is scheduled for re-screening in January.  She is not currently exercising.    06/05/14 update:  The patient presents today with her husband, who supplements the history.  The patient is currently on carbidopa/levodopa 25/100, 1 tablet at 9 AM/1 PM and 2 tablets at 6 PM.   She is also on Comtan 200 mg, one tablet 3 times per day.  No falls.  No lightheadedness or near syncope.  No hallucinations.   I reviewed records since last visit.  Weight loss has been a concern.  The dietician has talked with her about suggestions to help, and the patient states that she was told to take Carnation 3 times per day.  Pt states that the biggest issue that has caused weight loss has been persistent nausea, which has been going on for about 2 months.  They really do not think that it is related to the Parkinson's medications, because it did not start with changes in Parkinson's medications.  It actually seemed to start after a visit to a restaurant and then persisted.  She just feels nauseated, has lost taste for food and  has lost appetite.  She followed up with Dr. Kenton Kingfisher and was started on Zantac about 3 or 4 days ago and does feel better and her blood pressure medication was discontinued.  10/04/14 update:  The patient presents today with her husband, who supplements the history.  The patient is currently on carbidopa/levodopa 25/100, 1 tablet at 9 AM/1 PM and 2 tablets at 6 PM.   She is also on Comtan 200 mg, one tablet 3 times per day. We d/c her comtan  for a week to see if her GI upset changed at all and it did not so we restarted it since obviously that was not the etiology.  She continues to have GI upset in the AM.  She went to her PCP and she was given Zofran and states that it made it worse but she only took it once.  It is only the AM when she has nausea and she does better the rest of the day.    12/14/14 update:  The patient returns today, accompanied by her husband who supplements the history.  Records were reviewed since last visit.  She has a history of breast cancer and was having weight loss/anorexia and saw her oncologist.  She ended up having a CT chest, abdomen and pelvis and those were unremarkable for any new lesions.  She has lost about 13 pounds since March of last year.  She has been complaining about nausea the last several visits.  In fact, we've previously stopped her entacapone to see if that would help the nausea, but it did not.  She did not think that it was related at all to her levodopa.  She no longer has the nausea but in the AM she doesn't feel good.  She has difficulty describing what this means.  She remains on carbidopa/levodopa 25/100, 1 tablet at 9 AM/one tablet at 1 PM and 2 tablets at 6 PM.  She is still on entacapone, 200 mg 3 times a day.  They brought with them a list of blood pressures and while the SBP is generally high (170's-190's without med and 150's-180's with BP med) it was never over 200.  She states that she was off the BP med a few days and felt better but when she went  back on it she felt worse again.  She was told to go back on it because it her BP's were too high.  She is only on amlodipine 2.5 mg.    03/05/15 update:  The patient is following up today regarding her Parkinson's disease.  She is currently on carbidopa/levodopa 25/100, 1 tablet at 9 AM, 1 tablet at 1 PM and 2 tablets at 6 PM.  She takes entacapone with each of these dosages.  At last visit, she was very focused on her blood pressure and I asked her to follow-up with her primary care physician to see if she actually needed her blood pressure medication.  Today, the patient and her husband state that she was able to d/c her amlodipine and her BP has been well controlled. She states that the nausea is better.  She states that she gets up in the AM and takes her med and just feels jittery and anxious.  Her husband asks about trying to take a levodopa at bedtime to see if that helps.  When she takes her xanax, the anxiety does seem to get better.  They were thinking about asking PCP to see if she can go up on the medication.  Husband admits that balance sometimes off and cognition not great.  PT did not think that she needed walker per their hx.  She has gotten back to her exercise.   By 3-4 pm, pt feels well.    06/11/15 update:  The patient presents today for follow-up, accompanied by her husband who supplements the history.  She is on carbidopa/levodopa 25/100, 1 tablet at 9 AM, 1 tablet at 1 PM and 2 tablets at 6 PM.  She is on entacapone  200 mg 3 times a day and last visit we added carbidopa/levodopa 50/200 at night.  She didn't notice a big difference with that. Her husband isn't sure that she really gave it enough time to know.   Her biggest issue last visit was her anxiety and I told her to try an extra levodopa when she is anxious to see if that helps.  They tried that and it didn't help.    I did tell her that I did not want her taking daily Xanax.  Today, the patient states that the "only thing that helps  is xanax."   Unfortunately, she has not been exercising.  Her husband is undergoing tx daily for prostate CA and they are consumed with that.  Balance has been okay and her husband states that she is doing well in that regard.  No falls.  She is sleeping from 9pm until 5-6am. She is taking naps during the day.   09/11/15 update:  The patient follows up today, accompanied by her husband who supplements the history.  She is on carbidopa/levodopa 25/100, 1 tablet at 9 AM, 1 at 1 PM and 2 tablets at 6 PM.  I asked her to retry carbidopa/levodopa 50/200 at night last visit.  She is still on the entacapone 200 mg 3 times per day.  Last visit, I encouraged her to discontinue her daily Xanax and we started her on Wellbutrin XL for her anxiety.  Her husband thinks that she is still on it (and off of xanax) but wellbutrin is not on her medication list today.  Anxiety has been better per both patient and husband.  Husband states that Dr. Kenton Kingfisher told them to ask me about sertraline.  She was going to come in and talk to me on January 11, but ended up having increased hallucinations and was admitted to the hospital from January 11 to 08/11/2015.  She was treated for urinary tract infection although her urine culture ended up being negative.  Husband reports that she is 90-95% better from a mental standpoint.  No hallucinations since d/c from the hospital.  Doing home PT/OT/ST and nurse comes once per week.    01/09/16 update:  The patient follows up today, accompanied by her husband who supplements the history.  She is on carbidopa/levodopa 25/100, 1 tablet at 9 AM, 1 at 1 PM and 2 tablets at 6 PM.  For some reason, her carbidopa/levodopa 50/200 got accidentally stopped, and I told them to just go ahead and hold that for now.  She is still on the entacapone 200 mg 3 times per day.  Last visit, her husband was not sure if she was still on the Wellbutrin and ended up calling me back.  This was stopped during her hospitalization.   She, therefore, started on Zoloft instead right after our last visit.  She took a single 25 mg tablet and felt nauseated, but I reminded her that she feels nauseated a lot in the morning and asked her to retry it, telling her that she could take it before bedtime and then work up to 50 mg at night.  She is only on 25 mg and husband states that she is doing well and anxiety is better.  Only time an issue is that she hates to get in a car and travel.    Nausea is also better.    05/13/16 update:  The patient follows up today, accompanied by her husband who supplements the history.  She is on  carbidopa/levodopa 25/100, 1 tablet at 9 AM, 1 at 1 PM and 2 tablets at 6 PM.  She is on entacapone 200 mg 3 times a day with each of these dosages.  For the anxiety and depression, she is on Zoloft which has been increased to 50 mg daily.  That has helped.  She was hospitalized from 9/18-9/22 for CAP.  The records that were made available to me were reviewed. Having a facet joint injection tomorrow by Dr. Jacelyn Grip for back pain.  Not been able to get back to exercise because of pneumonia and back pain.  Has PD screen on 11/28.  08/19/16 update:  The patient follows up today, accompanied by her husband who supplements the history.  She is on carbidopa/levodopa 25/100, 1 tablet at 9 AM, 1 at 1 PM and 2 tablets at 6 PM.  She is on entacapone 200 mg 3 times a day with each of these dosages.  For the anxiety and depression, she is on Zoloft which has been increased to 50 mg daily.  Husband states that "she is not as happy as the gal I married."  The records that were made available to me were reviewed.  Pt denies falls.  Pt denies lightheadedness, near syncope.  No hallucinations.  Husband states that yesterday was a really bad day both physically and mentally (more confused and had help getting dressed).  Is much better today.  Appetite is overall down.  Drinking plenty of water.  Not exercising.  Is sleeping a lot per husband.  Husband states that was having back pain.  Went and had rhizotomy and that helped and then PT ordered but husband wants it at neurorehab center as patient very unsteady.  11/24/16 update: Patient seen today, accompanied by her husband to supplements the history.  Patient remains on carbidopa/levodopa 25/100, one tablet at 1 tablet at 9am, 1 PM and 2 tablets at 6 PM.  She takes entacapone 200 mg 3 times per day.  She is on sertraline, 50 mg daily.  She still suffers from rather significant anxiety.  She had one fall but they aren't sure if since last visit but thinks it was.  No hallucinations.  She has been attending outpatient Parkinson's therapies and I have reviewed those records.    04/06/17 update:  Pt seen in f/u for parkinsonism.  This patient is accompanied in the office by her spouse who supplements the history.  On carbidopa/levodopa 25/100, 1/1/2 and comtan 200 mg with each dosage.  Still on zoloft, 50 mg for anxiety, which continues to be problematic.  The records that were made available to me were reviewed.  Completed therapies since our last visit.  Also hospitalized in July for hematochezia with associated significant anemia, requiring transfusion.  EGD was negative.  Bleed was suspected lower GI in nature, likely from diverticular disease.  No falls.  Little off balance.  No hallucinations.  Memory is getting worse.  Some trouble at times knowing how to get in and out of bed.  08/18/17 update: Patient seen today in follow-up for parkinsonism and dementia.  She is accompanied by her husband who supplements history.  She is on carbidopa/levodopa 25/100, 1/1/2 and entacapone with each dosage.  Last visit, we started donepezil.  She has tolerated that medication well.  No diarrhea. She c/o "forgetting a lot."   Remains on sertraline for anxiety.  Her husband called since last visit and stated that occasionally the patient feels that she is choking when she eats.  Modified barium swallow was completed.   It was essentially unremarkable and regular diet with thin liquids was recommended.  Reviewed records since last visit.  She saw oncology.  She remains in remission from her non-Hodgkin's lymphoma and breast cancer.  She is still having back pain and called dr. Jacelyn Grip and states that she is to have "another procedure" to help her back.  Went to [facility] urology and going to start bladder stim.  Asks me about this.  Some trouble sleeping - "I don't want a pill."  01/18/18 update: Patient is seen today in follow-up for Parkinson's disease with associated Parkinson's disease dementia.  She is accompanied by her husband who supplements history.  She is on carbidopa/levodopa 25/100, 1 tablet in the morning, 1 tablet in the afternoon and 2 in the evening.  She takes and compliant with each of these.  Pt denies falls.  Pt denies lightheadedness, near syncope.  No hallucinations.  Mood has been good.  She has seen Dr. Benay Spice since our last visit.  Those records are reviewed.  She remains in remission from her breast cancer and non-Hodgkin's lymphoma.  She is seeing Dr. Ellene Route for her back pain and isn't exercising because of that.  She had an injection for her back pain and is scheduled for another on 02/01/18.  Husband states that he thinks that she is giving up and refusing to do everything.    Neuroimaging has  previously been performed.  An MRI brain was last done in 2010 but pt can no longer have MRI's because of an implantable hearing device.    PREVIOUS MEDICATIONS: none to date  ALLERGIES:  No Known Allergies  CURRENT MEDICATIONS:  Current Outpatient Medications on File Prior to Visit  Medication Sig Dispense Refill  . acetaminophen (TYLENOL) 325 MG tablet Take 650 mg by mouth every 6 (six) hours as needed for mild pain, moderate pain, fever or headache.     [name] acyclovir (ZOVIRAX) 400 MG tablet TAKE 1 TABLET BY MOUTH TWICE DAILY 120 tablet 2  . bisacodyl (DULCOLAX) 5 MG EC tablet Take 5 mg by mouth  daily as needed for moderate constipation.    . Calcium Carbonate-Vitamin D3 (CALCIUM 600-D) 600-400 MG-UNIT TABS Take 1 tablet by mouth at bedtime.    . carbidopa-levodopa (SINEMET IR) 25-100 MG tablet TAKE 1 TABLET BY MOUTH DAILY IN THE MORNING AND 1 TABLET IN THE AFTERNOON, AND 2 TABLETS IN THE EVENING 360 tablet 0  . cetirizine (ZYRTEC) 10 MG tablet Take 10 mg by mouth daily at 12 noon.     . cholecalciferol (VITAMIN D) 1000 units tablet Take 1,000 Units by mouth every evening.     . clobetasol (TEMOVATE) 0.05 % external solution clobetasol 0.05 % scalp solution    . donepezil (ARICEPT) 10 MG tablet Take 1 tablet (10 mg total) by mouth daily. 90 tablet 0  . entacapone (COMTAN) 200 MG tablet TAKE 1 TABLET BY MOUTH 3 TIMES DAILY WITH SINEMET 270 tablet 1  . Fluticasone-Salmeterol (ADVAIR) 250-50 MCG/DOSE AEPB Inhale 1 puff into the lungs 2 (two) times daily.    [name] ketoconazole (NIZORAL) 2 % shampoo ketoconazole 2 % shampoo    . mirabegron ER (MYRBETRIQ) 50 MG TB24 tablet Take 50 mg by mouth daily at 12 noon.     . Multiple Vitamins-Minerals (MULTIVITAMIN WITH MINERALS) tablet Take 1 tablet by mouth daily with breakfast.     . ondansetron (ZOFRAN) 8 MG tablet ondansetron HCl 8 mg tablet    .  pantoprazole (PROTONIX) 40 MG tablet pantoprazole 40 mg tablet,delayed release    . sertraline (ZOLOFT) 50 MG tablet Take 50 mg by mouth daily.    . traMADol (ULTRAM) 50 MG tablet tramadol 50 mg tablet     No current facility-administered medications on file prior to visit.     PAST MEDICAL HISTORY:   Past Medical History:  Diagnosis Date  . Anxiety   . Asthma   . Depression   . Diabetes mellitus without complication ([place])    10[birth year].Marland KitchenMarland Kitchenpt denies  . GERD (gastroesophageal reflux disease)   . Hearing loss   . NHL (non-Hodgkin's lymphoma) ([place])    nhl dx 9/04 breast ca dx1/12  . Parkinson disease ([place])     PAST SURGICAL HISTORY:   Past Surgical History:  Procedure Laterality Date  . Ba-HA Ear  implant    . ESOPHAGOGASTRODUODENOSCOPY N/A 02/04/2017   Procedure: ESOPHAGOGASTRODUODENOSCOPY (EGD);  Surgeon: Arta Silence, MD;  Location: [facility];  Service: Endoscopy;  Laterality: N/A;  . MASTECTOMY  2 /8/ 12   bilateral  . MASTECTOMY      SOCIAL HISTORY:   Social History   Socioeconomic History  . Marital status: Married    Spouse name: Not on file  . Number of children: Not on file  . Years of education: Not on file  . Highest education level: Not on file  Occupational History  . Occupation: retired    Fish farm manager: RETIRED    Comment: [employer]  Social Needs  . Financial resource strain: Not on file  . Food insecurity:    Worry: Not on file    Inability: Not on file  . Transportation needs:    Medical: Not on file    Non-medical: Not on file  Tobacco Use  . Smoking status: Never Smoker  . Smokeless tobacco: Never Used  . Tobacco comment: husband wsa smoker  Substance and Sexual Activity  . Alcohol use: No    Comment: no  . Drug use: No  . Sexual activity: Not on file  Lifestyle  . Physical activity:    Days per week: Not on file    Minutes per session: Not on file  . Stress: Not on file  Relationships  . Social connections:    Talks on phone: Not on file    Gets together: Not on file    Attends religious service: Not on file    Active member of club or organization: Not on file    Attends meetings of clubs or organizations: Not on file    Relationship status: Not on file  . Intimate partner violence:    Fear of current or ex partner: Not on file    Emotionally abused: Not on file    Physically abused: Not on file    Forced sexual activity: Not on file  Other Topics Concern  . Not on file  Social History Narrative  . Not on file    FAMILY HISTORY:   Family Status  Relation Name Status  . Father  Deceased       CA, lung  . Mother  Deceased       CA; complications of splenectomy  . Brother  Deceased       3, renal failure, DM-2; CAD; CA   . Child  Alive       5 (4 biological), one with PKU    ROS:  Review of Systems  Constitutional: Positive for weight loss.  HENT: Positive for hearing loss.  Cardiovascular: Negative.   Genitourinary: Positive for urgency.  Musculoskeletal: Positive for back pain and joint pain.  Skin: Negative.   '  PHYSICAL EXAMINATION:    VITALS:   Vitals:   01/18/18 1115  BP: 140/70  Pulse: 77  SpO2: 97%  Weight: 115 lb 6 oz (52.3 kg)  Height: 5\' 3"  (1.6 m)   Wt Readings from Last 3 Encounters:  01/18/18 115 lb 6 oz (52.3 kg)  01/03/18 117 lb (53.1 kg)  08/18/17 125 lb (56.7 kg)     GEN:  The patient appears stated age.  She is very anxious today CV:  RRR with 3/6 SEM Lungs:  CTAB. She has several fast breaths when anxious Neck:  No bruits but cardiac murmur radiates to the bilateral carotids   Neurological examination:  Orientation: She is alert to person and place [facility] [address] Cognitive Assessment  05/13/2016 09/11/2015 12/14/2014  Visuospatial/ Executive (0/5) 3 3 3   Naming (0/3) 2 3 3   Attention: Read list of digits (0/2) 2 1 2   Attention: Read list of letters (0/1) 1 1 1   Attention: Serial 7 subtraction starting at 100 (0/3) 0 0 0  Language: Repeat phrase (0/2) 1 0 1  Language : Fluency (0/1) 1 1 1   Abstraction (0/2) 1 1 1   Delayed Recall (0/5) 0 0 0  Orientation (0/6) 5 6 6   Total 16 16 18   Adjusted Score (based on education) 17 17 19    Cranial nerves: There is good facial symmetry.  The visual fields are full to confrontational testing. The speech is fluent and clear.  She is hypophonic.  Soft palate rises symmetrically and there is no tongue deviation. Hearing is markedly decreased today even with hearing aid Sensation: Sensation is intact to light outh throughout. Motor: Strength is 5/5 in the bilateral upper and lower extremities.   Shoulder shrug is equal and symmetric.  There is no pronator drift.   Movement examination: Tone: There is no rigidity  today Abnormal movements: There is no tremor Coordination:  There is no decremation noted today Gait and Station: The patient pushes off of the chair.  She has a cane but walks well with it with good stride length  Labs:  Lab Results  Component Value Date   WBC 6.0 01/03/2018   HGB 11.8 01/03/2018   HCT 35.2 01/03/2018   MCV 88.7 01/03/2018   PLT 158 01/03/2018     Chemistry      Component Value Date/Time   NA 140 01/03/2018 1107   NA 143 04/13/2017 1155   K 3.7 01/03/2018 1107   K 4.1 04/13/2017 1155   CL 105 01/03/2018 1107   CL 106 12/26/2012 1110   CO2 24 01/03/2018 1107   CO2 27 04/13/2017 1155   BUN 22 01/03/2018 1107   BUN 19.5 04/13/2017 1155   CREATININE 1.18 (H) 01/03/2018 1107   CREATININE 1.2 (H) 04/13/2017 1155      Component Value Date/Time   CALCIUM 9.5 01/03/2018 1107   CALCIUM 9.6 04/13/2017 1155   ALKPHOS 100 01/03/2018 1107   ALKPHOS 97 04/13/2017 1155   AST 20 01/03/2018 1107   AST 20 04/13/2017 1155   ALT <6 01/03/2018 1107   ALT 7 04/13/2017 1155   BILITOT 0.4 01/03/2018 1107   BILITOT 0.31 04/13/2017 1155      Lab Results  Component Value Date   VITAMINB12 1053 (H) 06/10/2006   Lab Results  Component Value Date   TSH 2.93 09/19/2012    ASSESSMENT/PLAN:  1.  Idiopathic Parkinson's disease, dx 08/2012.    -We discussed the diagnosis as well as pathophysiology of the disease.  We discussed treatment options as well as prognostic indicators.  Patient education was provided.  - continue carbidopa/levodopa 25/100, 1/1/2. She needs to avoid protein when taking levodopa and discussed this.  Risks, benefits, side effects and alternative therapies were discussed.  The opportunity to ask questions was given and they were answered to the best of my ability.  The patient expressed understanding and willingness to follow the outlined treatment protocols.  -Pt is on entacapone tid and she will continue on this.    -discussed community programs at  length  -think that caregiver support group would be helpful for husband and discussed that 2.  Anxiety  -she is still on sertraline 50 mg daily.    -anxiety has always been a huge driving factor and was very prominent today.   3.  Cardiac murmur  -being followed yearly by cardiology for mod AS 4.  PDD  -continue aricept, 10 mg daily.  Risks, benefits, side effects and alternative therapies were discussed.  The opportunity to ask questions was given and they were answered to the best of my ability.  The patient expressed understanding and willingness to follow the outlined treatment protocols.  - Is free of hallucinations but if occur again, will consider nuplazid.   -long discussion about "writing her life story" and doing things that promote memory building 5. Urinary incontinence  -seeing [facility].  Reassured her that this is okay with PD.  Pt asks about this again today 6.  low back pain  -seeing Dr. Ellene Route.  Having injections. 7.  F/u 4 months.  Much greater than 50% of this visit was spent in counseling and coordinating care.  Total face to face time:  30 min

## 2018-01-18 ENCOUNTER — Encounter: Payer: Self-pay | Admitting: Neurology

## 2018-01-18 ENCOUNTER — Ambulatory Visit (INDEPENDENT_AMBULATORY_CARE_PROVIDER_SITE_OTHER): Payer: Medicare Other | Admitting: Neurology

## 2018-01-18 VITALS — BP 140/70 | HR 77 | Ht 63.0 in | Wt 115.4 lb

## 2018-01-18 DIAGNOSIS — M545 Low back pain: Secondary | ICD-10-CM

## 2018-01-18 DIAGNOSIS — F411 Generalized anxiety disorder: Secondary | ICD-10-CM

## 2018-01-18 DIAGNOSIS — F0280 Dementia in other diseases classified elsewhere without behavioral disturbance: Secondary | ICD-10-CM | POA: Diagnosis not present

## 2018-01-18 DIAGNOSIS — G20 Parkinson's disease: Secondary | ICD-10-CM | POA: Diagnosis not present

## 2018-01-18 NOTE — Patient Instructions (Addendum)
1.  I want you to be active.    2.  I want you to write your life story.  I will ask you next visit about your progress.  Community Parkinson's Exercise Programs   Parkinson's Wellness Recovery Exercise Programs:   PWR! Moves PD Exercise Class:  This is a therapist-led exercise class for people with Parkinson's disease in the [place] community. It consists of a one-hour exercise class each week. Classes are offered in eight-week sessions, and the cost per session is $80. Class size is limited to a maximum of 20 participants. Participant criteria includes: Participant must be able to get up and down from the floor with minimal to no assistance, have had 0-1 falls in the past 6 months, and have completed physical or occupational therapy at [facility] within the past year.  To find out more about session dates, questions, or to register, please contact Mady Haagensen, Physical Therapist, or Nita Sells, Physical Brewing technologist, at [facility] at [phone].  PWR! Circuit Class:  This is a therapist-led exercise class with intervals of circuit activities incorporating PWR! Moves into functional activities. It consists of one 45-minute exercise class per week. Classes are offered in eight-week sessions, and the cost per session is $120. Class size is limited to a maximum of eight participants to allow for hands-on instruction. Participant criteria: class is ideal for people with Parkinson's disease who have completed PWR! Moves Exercise Class or who are currently independently exercising and want to be challenged, must be able to walk independently with 0-1 falls in the past 6 months, able to get up and down from the floor independently, able to sit to stand independently, and able to jog 20 feet.   To find out more about session dates, questions, or to register, please contact Mady Haagensen, Physical Therapist, or Nita Sells,  Physical Brewing technologist, at [facility] at [phone].   [employer] Parkinson's Cycle:   Parkinson's Cycle Class at [facility] This is an ongoing class on Monday and Thursday mornings at 10:45 a.m. A healthcare provider referral is required to enroll. This class is FREE to participants, and you do not have to be a member of the [employer] to enroll. Contact Beth at [phone] or [email or web address]. Parkinson's Cycle Class at [facility] Ongoing Class Monday, Wednesday, and Friday mornings at 9:00 a.m. A healthcare provider referral is required to enroll. This class is FREE to participants, and you do not have to be a member of the [employer] to enroll. Contact Marlee at [phone] or [email or web address]. Parkinson's Cycle Class at [facility] Ongoing Class every Friday mornings at 12 p.m.  A healthcare provider referral is required to enroll. This class is FREE to participants, and you do not have to be a member of the [employer] to enroll. Contact [phone].  Parkinson's Cycle Class at [facility] Ongoing Class every Monday at 12pm.  A healthcare provider referral is required to enroll. This class is FREE to participants, and you do not have to be a member of the [employer] to enroll. Contact Almyra Free at [phone] or  [email or web address].   [employer]:  [employer]  Classes are offered Mondays at 5:15 p.m. and Tuesdays and Thursdays at 12 p.m. at [employer]. For more information, contact [phone] or visit [email or web address] or [email or web address]. [employer] [place] Classes are offered Monday, Wednesday, and Friday from 9:30 a.m. - 11:00 a.m. For more information,  contact [phone] or [phone] or email [email or web address] or visit [email or web address] or [email or web address]. [employer]  (classes are offered at 2 locations) . [facility] [facility] [employer] in [place] (for more information, contact [place] at [phone] or email [email or web address] . Cathren Laine at [facility] (class is open to the public -- for more information, contact Clabe Seal at [phone] or email [email or web address]) [place] are held at [facility] in [place], [name]. For more information, call Dr. Bing Plume at [phone] or [email or web address].   Personal Training for Parkinson's:   ACT Offers certified personal training to customize a program to meet your exercise needs to address Parkinson's disease. For more information, contact [phone] or visit [email or web address].  Community Dance for Parkinson's:   Community dance class for people with Parkinson's Disease Wednesdays at 9 a.m. [employer] [place], [place] [address] Please contact Eliberto Ivory [phone] for more information  Scholarships Available for Fitness Programs:  The [place] for [employer] is a non-profit 501(C)3 organization run by volunteers, whose mission is to strive to empower those living with Parkinson's Disease (PD), Progressive Supra-Nuclear Palsy (PSP) and Multiple System Atrophy ([place]).  Through financial support, recipients benefit from individual and group programs. [phone] [email or web address]  Powering Together for Parkinson's & Movement Disorders  The [place] Parkinson's and Movement Disorders team know that living well with a movement disorder extends far beyond our clinic walls. We are together with you. Our team is passionate about providing resources to you and your loved ones who are living with Parkinson's disease and movement disorders. Participate in these programs and join our community. These resources are free or low cost!   [place] Parkinson's and Movement Disorders Program is  adding:   Innovative educational programs for patients and caregivers.   Support groups for patients and caregivers living with Parkinson's disease.   Parkinson's specific exercise programs.   Custom tailored therapeutic programs that will benefit patient's living with Parkinson's disease.   We are in this together. You can help and contribute to grow these programs and resources in our community. 100% of the funds donated to the [place] stays right here in our community to support patients and their caregivers.  To make a tax deductible contribution:  -ask for a Power Together for Parkinson's envelope in the office today.  - call the Office of Institutional Advancement at [phone].      Support & Resources  You are not alone. Being diagnosed with Parkinson's disease can be an emotional diagnosis, and one that impacts your whole family. Our goal is to make sure you're getting support not just while you're at [facility], but in the days between appointments. Our social worker will work with both patients and their support systems to help you cope with the changes your diagnosis brings to your everyday life. You will learn how to recognize and adjust to new needs, receive information about coping skills related to disease progression, receive counseling and be plugged into community resources and other areas of support. You can contact our Education officer, museum, Kathryne Hitch. Marcello Moores, for resources and support at [phone].   Power Over [place] This group meets every third Tuesday from 4:00 p.m. - 5:00 p.m. at the [facility]. This group provides information about how Parkinson's disease affects you as an individual, how to proactively take control of Parkinson's disease, and steps to manage your Parkinson's disease, education, and information about  exercise for lifelong activity.  Other Local Support Groups: (please call group  leader to confirm meeting location) [facility] Day/Time Location Group Leader  [place]  1st Tuesday  (no June/July mtg) 10:30 am                                                                  [address], [place], [place] [address] Heath Lark                            [phone]                                    [email or web address]         [place]  2nd Tuesday   10:30 am  [address]      [place], [place] [address] [place]                                         [phone]                         [email or web address]   [place]) 1st Thursday     10:30 am [place], [place] [address] [address] [place]                                          [phone]  [place]- early onset  [place]                                      [email or web address]  [place]  3rd Monday     2pm [address] [place], [place] [address] Myra Gianotti, LCSW                      [email or web address] Danker 2nd Thursday          10am [place], [place] [address] Reymundo Poll                      [phone]                       [email or web address]  3rd Wednesday      [place], [place] [address] [phone]

## 2018-01-28 DIAGNOSIS — R350 Frequency of micturition: Secondary | ICD-10-CM | POA: Diagnosis not present

## 2018-01-29 ENCOUNTER — Other Ambulatory Visit: Payer: Self-pay | Admitting: Neurology

## 2018-02-01 DIAGNOSIS — M5416 Radiculopathy, lumbar region: Secondary | ICD-10-CM | POA: Diagnosis not present

## 2018-02-11 ENCOUNTER — Ambulatory Visit: Payer: Medicare Other | Admitting: Cardiology

## 2018-02-11 ENCOUNTER — Ambulatory Visit (INDEPENDENT_AMBULATORY_CARE_PROVIDER_SITE_OTHER): Payer: Medicare Other | Admitting: Cardiology

## 2018-02-11 ENCOUNTER — Encounter: Payer: Self-pay | Admitting: Cardiology

## 2018-02-11 VITALS — BP 142/64 | HR 69 | Ht 63.0 in | Wt 119.0 lb

## 2018-02-11 DIAGNOSIS — G20 Parkinson's disease: Secondary | ICD-10-CM | POA: Diagnosis not present

## 2018-02-11 DIAGNOSIS — I350 Nonrheumatic aortic (valve) stenosis: Secondary | ICD-10-CM | POA: Diagnosis not present

## 2018-02-11 DIAGNOSIS — R0989 Other specified symptoms and signs involving the circulatory and respiratory systems: Secondary | ICD-10-CM

## 2018-02-11 NOTE — Patient Instructions (Signed)
Medication Instructions:  The current medical regimen is effective;  continue present plan and medications.  Testing/Procedures: Your physician has requested that you have an echocardiogram. Echocardiography is a painless test that uses sound waves to create images of your heart. It provides your doctor with information about the size and shape of your heart and how well your heart's chambers and valves are working. This procedure takes approximately one hour. There are no restrictions for this procedure.  Follow-Up: Follow up in 6 months with Dr. Marlou Porch.  You will receive a letter in the mail 2 months before you are due.  Please call us when you receive this letter to schedule your follow up appointment.  Any Other Special Instructions Will Be Listed Below (If Applicable).  Thank you for choosing [facility]!!       If you need a refill on your cardiac medications before your next appointment, please call your pharmacy.

## 2018-02-11 NOTE — Progress Notes (Signed)
Cardiology Office Note    Date:  02/11/2018   ID:  [name], DOB 25-May-1928, MRN [id number]  PCP:  [name] Frees, MD  Cardiologist:   [name] Furbish, MD     History of Present Illness:  [name] is a 84 y.o. female here for Follow-up of aortic stenosis. She's not having any shortness of breath, no chest pain, no unexplained syncope. No palpitations. She has been seeing [name] for years for Parkinson's.  She was hospitalized in September 2017 with community-acquired pneumonia.  She has also had some orthostatic hypotension for instance on 09/11/16 supine 185/89, 1 minute after standing 113/52 with wooziness, 3 minutes 128/65. She is hydrating. [name] the possibility of compression binder.   Another 126/67 sitting, standing 87/47  Her husband also is a patient of mine. Prior bypass surgery.  Overall she has been doing quite well however she does have high blood pressure readings occasionally while at Parkinson's rehabilitation. She has not had any further syncopal episodes. No chest pain, no shortness of breath. Back still bothering her. 3 wedding anniver. BP ran high in back injection clinic.   Past Medical History:  Diagnosis Date  . Anxiety   . Asthma   . Depression   . Diabetes mellitus without complication ([place])    2/63/33.Marland KitchenMarland Kitchenpt denies  . GERD (gastroesophageal reflux disease)   . Hearing loss   . NHL (non-Hodgkin's lymphoma) ([place])    nhl dx 9/04 breast ca dx1/12  . Parkinson disease [facility])     Past Surgical History:  Procedure Laterality Date  . Ba-HA Ear implant    . ESOPHAGOGASTRODUODENOSCOPY N/A 02/04/2017   Procedure: ESOPHAGOGASTRODUODENOSCOPY (EGD);  Surgeon: Arta Silence, MD;  Location: [facility];  Service: Endoscopy;  Laterality: N/A;  . MASTECTOMY  2 /8/ 12   bilateral  . MASTECTOMY      Outpatient Medications Prior to Visit  Medication Sig Dispense Refill  . acetaminophen (TYLENOL) 325 MG tablet Take 650 mg by mouth  every 6 (six) hours as needed for mild pain, moderate pain, fever or headache.     [name] acyclovir (ZOVIRAX) 400 MG tablet TAKE 1 TABLET BY MOUTH TWICE DAILY 120 tablet 2  . bisacodyl (DULCOLAX) 5 MG EC tablet Take 5 mg by mouth daily as needed for moderate constipation.    . Calcium Carbonate-Vitamin D3 (CALCIUM 600-D) 600-400 MG-UNIT TABS Take 1 tablet by mouth at bedtime.    . carbidopa-levodopa (SINEMET IR) 25-100 MG tablet TAKE 1 TABLET BY MOUTH DAILY IN THE MORNING, 1 IN THE AFTERNOON, AND 2 TABLETS IN THE EVENING 360 tablet 1  . cetirizine (ZYRTEC) 10 MG tablet Take 10 mg by mouth daily at 12 noon.     . cholecalciferol (VITAMIN D) 1000 units tablet Take 1,000 Units by mouth every evening.     . clobetasol (TEMOVATE) 0.05 % external solution clobetasol 0.05 % scalp solution    . donepezil (ARICEPT) 10 MG tablet Take 1 tablet (10 mg total) by mouth daily. 90 tablet 0  . entacapone (COMTAN) 200 MG tablet TAKE 1 TABLET BY MOUTH 3 TIMES DAILY WITH SINEMET 270 tablet 1  . Fluticasone-Salmeterol (ADVAIR) 250-50 MCG/DOSE AEPB Inhale 1 puff into the lungs 2 (two) times daily.    [name] ketoconazole (NIZORAL) 2 % shampoo ketoconazole 2 % shampoo    . mirabegron ER (MYRBETRIQ) 50 MG TB24 tablet Take 50 mg by mouth daily at 12 noon.     . Multiple Vitamins-Minerals (MULTIVITAMIN WITH MINERALS) tablet Take 1 tablet  by mouth daily with breakfast.     . ondansetron (ZOFRAN) 8 MG tablet ondansetron HCl 8 mg tablet    . pantoprazole (PROTONIX) 40 MG tablet pantoprazole 40 mg tablet,delayed release    . sertraline (ZOLOFT) 50 MG tablet Take 50 mg by mouth daily.    . traMADol (ULTRAM) 50 MG tablet tramadol 50 mg tablet     No facility-administered medications prior to visit.      Allergies:   Patient has no known allergies.   Social History   Socioeconomic History  . Marital status: Married    Spouse name: Not on file  . Number of children: Not on file  . Years of education: Not on file  . Highest  education level: Not on file  Occupational History  . Occupation: retired    Fish farm manager: RETIRED    Comment: [employer]  Social Needs  . Financial resource strain: Not on file  . Food insecurity:    Worry: Not on file    Inability: Not on file  . Transportation needs:    Medical: Not on file    Non-medical: Not on file  Tobacco Use  . Smoking status: Never Smoker  . Smokeless tobacco: Never Used  . Tobacco comment: husband wsa smoker  Substance and Sexual Activity  . Alcohol use: No    Comment: no  . Drug use: No  . Sexual activity: Not on file  Lifestyle  . Physical activity:    Days per week: Not on file    Minutes per session: Not on file  . Stress: Not on file  Relationships  . Social connections:    Talks on phone: Not on file    Gets together: Not on file    Attends religious service: Not on file    Active member of club or organization: Not on file    Attends meetings of clubs or organizations: Not on file    Relationship status: Not on file  Other Topics Concern  . Not on file  Social History Narrative  . Not on file     Family History:  The patient's family history includes Cancer in her father and mother; Lung cancer in her father.   ROS:   Please see the history of present illness.    Review of Systems  All other systems reviewed and are negative.      PHYSICAL EXAM:   VS:  BP (!) 142/64   Pulse 69   Ht 5\' 3"  (1.6 m)   Wt 119 lb (54 kg)   LMP  (LMP Unknown)   SpO2 99%   BMI 21.08 kg/m    GEN: Well nourished, well developed, in no acute distress  HEENT: normal  Neck: no JVD, + carotid bruits, or masses Cardiac: RRR; 3/6 SM, no rubs, or gallops,no edema  Respiratory:  clear to auscultation bilaterally, normal work of breathing GI: soft, nontender, nondistended, + BS MS: no deformity or atrophy  Skin: warm and dry, no rash Neuro:  Alert and Oriented x 3, Strength and sensation are intact Psych: euthymic mood, full affect    Wt Readings  from Last 3 Encounters:  02/11/18 119 lb (54 kg)  01/18/18 115 lb 6 oz (52.3 kg)  01/03/18 117 lb (53.1 kg)      Studies/Labs Reviewed:   EKG:  02/11/18 - NSR 69. LVH.   Recent Labs: 01/03/2018: ALT <6; BUN 22; Creatinine 1.18; Hemoglobin 11.8; Platelet Count 158; Potassium 3.7; Sodium 140  Lipid Panel No results found for: CHOL, TRIG, HDL, CHOLHDL, VLDL, LDLCALC, LDLDIRECT  Additional studies/ records that were reviewed today include:  Prior hospital records, office notes reviewed, lab work reviewed  Echocardiogram 09/12/15: - Compared to a prior echo in 2009, there is now moderate aortic   stenosis with AVA around 1.1-1.2 cm2. The LVEF is higher at   65-70%.  ECHO 10/02/16 - Left ventricle: The cavity size was normal. Wall thickness was   increased in a pattern of mild LVH. Systolic function was normal.   The estimated ejection fraction was in the range of 60% to 65%.   Wall motion was normal; there were no regional wall motion   abnormalities. Doppler parameters are consistent with abnormal   left ventricular relaxation (grade 1 diastolic dysfunction). The   E/e&' ratio is >15, suggesting elevated LV filling pressure. - Aortic valve: Calcified leaflets with restricted excursion. There   is moderate stenosis. There was trivial regurgitation. Mean   gradient (S): 31 mm Hg. Peak gradient (S): 47 mm Hg. Valve area   (VTI): 0.8 cm^2. - Mitral valve: Calcified annulus. Mildly thickened leaflets .   There was mild regurgitation. - Left atrium: The atrium was normal in size. - Atrial septum: No defect or patent foramen ovale was identified. - Tricuspid valve: There was mild regurgitation. - Pulmonary arteries: PA peak pressure: 33 mm Hg (S). - Inferior vena cava: The vessel was normal in size. The   respirophasic diameter changes were in the normal range (>= 50%),   consistent with normal central venous pressure.  Impressions:  - Compared to a prior study in 2017, the mean  aortic valve gradient   has increased rom 28 to 31 mmHg, consistent with moderate   stenosis.   ASSESSMENT:    1. Nonrheumatic aortic valve stenosis   2. Parkinson disease ([place])   3. Bilateral carotid bruits      PLAN:  In order of problems listed above:  Aortic stenosis -Continues to be moderate in severity on repeat echocardiogram. Has been battling with some orthostatic hypotension. With her moderate severity aortic stenosis, this should not be playing a significant hemodynamic role at this point. Check yearly echocardiogram.  No significant changes.  Parkinson's disease -Her orthostatic hypotension likely goes hand in hand with dysautonomia. Salt liberalization, fluids, support hose. Reviewed [name] note. -I'm willing to tolerate a higher blood pressure given her prior issues with severe orthostatic hypotension. Of course, if she has significant symptoms with her high blood pressure i.e. strokelike symptoms or severe shortness of breath, she is to seek medical attention.  No changes stable.  Orthostatic hypotension -As above, no new changes.  4-month follow-up  Medication Adjustments/Labs and Tests Ordered: Current medicines are reviewed at length with the patient today.  Concerns regarding medicines are outlined above.  Medication changes, Labs and Tests ordered today are listed in the Patient Instructions below. Patient Instructions  Medication Instructions:  The current medical regimen is effective;  continue present plan and medications.  Testing/Procedures: Your physician has requested that you have an echocardiogram. Echocardiography is a painless test that uses sound waves to create images of your heart. It provides your doctor with information about the size and shape of your heart and how well your heart's chambers and valves are working. This procedure takes approximately one hour. There are no restrictions for this procedure.  Follow-Up: Follow up in 6 months  with Dr. Marlou Porch.  You will receive a letter in the mail 2  months before you are due.  Please call us when you receive this letter to schedule your follow up appointment.  Any Other Special Instructions Will Be Listed Below (If Applicable).  Thank you for choosing [facility]!!       If you need a refill on your cardiac medications before your next appointment, please call your pharmacy.        Signed, [name] Furbish, MD  02/11/2018 3:11 PM    [facility] [place], [place], [place]  [address] Phone: [phone]; Fax: [phone]

## 2018-02-16 DIAGNOSIS — M545 Low back pain: Secondary | ICD-10-CM | POA: Diagnosis not present

## 2018-02-16 DIAGNOSIS — E7800 Pure hypercholesterolemia, unspecified: Secondary | ICD-10-CM | POA: Diagnosis not present

## 2018-02-16 DIAGNOSIS — N3281 Overactive bladder: Secondary | ICD-10-CM | POA: Diagnosis not present

## 2018-02-16 DIAGNOSIS — Z853 Personal history of malignant neoplasm of breast: Secondary | ICD-10-CM | POA: Diagnosis not present

## 2018-02-16 DIAGNOSIS — N183 Chronic kidney disease, stage 3 (moderate): Secondary | ICD-10-CM | POA: Diagnosis not present

## 2018-02-16 DIAGNOSIS — G20 Parkinson's disease: Secondary | ICD-10-CM | POA: Diagnosis not present

## 2018-02-16 DIAGNOSIS — J300 Vasomotor rhinitis: Secondary | ICD-10-CM | POA: Diagnosis not present

## 2018-02-16 DIAGNOSIS — F419 Anxiety disorder, unspecified: Secondary | ICD-10-CM | POA: Diagnosis not present

## 2018-02-16 DIAGNOSIS — K219 Gastro-esophageal reflux disease without esophagitis: Secondary | ICD-10-CM | POA: Diagnosis not present

## 2018-02-16 DIAGNOSIS — Z8572 Personal history of non-Hodgkin lymphomas: Secondary | ICD-10-CM | POA: Diagnosis not present

## 2018-02-17 ENCOUNTER — Other Ambulatory Visit: Payer: Self-pay

## 2018-02-17 ENCOUNTER — Ambulatory Visit (HOSPITAL_COMMUNITY): Payer: Medicare Other | Attending: Cardiology

## 2018-02-17 DIAGNOSIS — I080 Rheumatic disorders of both mitral and aortic valves: Secondary | ICD-10-CM | POA: Diagnosis not present

## 2018-02-17 DIAGNOSIS — I350 Nonrheumatic aortic (valve) stenosis: Secondary | ICD-10-CM | POA: Diagnosis not present

## 2018-02-17 DIAGNOSIS — E119 Type 2 diabetes mellitus without complications: Secondary | ICD-10-CM | POA: Diagnosis not present

## 2018-02-22 ENCOUNTER — Other Ambulatory Visit: Payer: Self-pay | Admitting: *Deleted

## 2018-02-22 DIAGNOSIS — I350 Nonrheumatic aortic (valve) stenosis: Secondary | ICD-10-CM

## 2018-02-28 ENCOUNTER — Encounter: Payer: Self-pay | Admitting: Nurse Practitioner

## 2018-02-28 ENCOUNTER — Inpatient Hospital Stay: Payer: Medicare Other | Attending: Oncology | Admitting: Nurse Practitioner

## 2018-02-28 ENCOUNTER — Telehealth: Payer: Self-pay | Admitting: Nurse Practitioner

## 2018-02-28 VITALS — BP 128/76 | HR 86 | Temp 97.8°F | Resp 18 | Ht 63.0 in | Wt 118.5 lb

## 2018-02-28 DIAGNOSIS — C8590 Non-Hodgkin lymphoma, unspecified, unspecified site: Secondary | ICD-10-CM

## 2018-02-28 DIAGNOSIS — Z8572 Personal history of non-Hodgkin lymphomas: Secondary | ICD-10-CM | POA: Diagnosis not present

## 2018-02-28 DIAGNOSIS — Z853 Personal history of malignant neoplasm of breast: Secondary | ICD-10-CM | POA: Insufficient documentation

## 2018-02-28 NOTE — Telephone Encounter (Signed)
Scheduled appt per 8/5 los - gave patient AVS and calender per los.  

## 2018-02-28 NOTE — Progress Notes (Addendum)
[facility] OFFICE PROGRESS NOTE   Diagnosis: Non-Hodgkin's lymphoma, breast cancer  INTERVAL HISTORY:   [name] returns for follow-up.  She reports stable chronic back pain.  She plans to follow-up with pain management.  No fevers or sweats.  Energy level remains poor.  She attributes this to the back pain.  Appetite is better.  She has gained some weight since her last visit.  Objective:  Vital signs in last 24 hours:  Blood pressure 128/76, pulse 86, temperature 97.8 F (36.6 C), temperature source Oral, resp. rate 18, height _0  (1.6 m), weight 118 lb 8 oz (53.8 kg), SpO2 99 %.    HEENT: No thrush or ulcers. Lymphatics: No palpable cervical, supraclavicular, axillary or inguinal lymph nodes. Resp: Lungs clear bilaterally. Cardio: Regular rate and rhythm.  2/6 systolic murmur. GI: Abdomen soft and nontender.  No hepatosplenomegaly. Vascular: No leg edema.   Lab Results:  Lab Results  Component Value Date   WBC 6.0 01/03/2018   HGB 11.8 01/03/2018   HCT 35.2 01/03/2018   MCV 88.7 01/03/2018   PLT 158 01/03/2018   NEUTROABS 4.7 01/03/2018    Imaging:  No results found.  Medications: I have reviewed the patient's current medications.  Assessment/Plan: 1. Non-Hodgkin lymphoma treated with fludarabine/rituximab, last in July of 2009.  1. Restaging CT 11/24/2010 revealed evidence of progressive lymphoma in the abdomen and pelvis.  2. Initiation of salvage therapy with bendamustine/rituximab 12/04/2010. She completed 3 cycles.  3. Restaging CT 03/03/2011 revealed stable retroperitoneal soft tissue and a marked decrease in the soft tissue thickening associated with dilated loops of small bowel. 2. History of ITP.  3. History of Herpes zoster-maintained on prophylactic acyclovir. 4. History of anemia secondary to non-Hodgkin lymphoma, chemotherapy, and a history of autoimmune hemolysis.  5. Hypertension. 6. Right-hand tremor/ataxia-followed  by neurology, now taking Sinemet, diagnosed with Parkinson's disease 7. Hearing loss-status post placement of an implanted hearing device by Dr. Cresenciano Lick. 8. Bilateral breast cancer, right breast cancer-grade 1, T1 N0, ER/PR positive, and HER2 negative. Left breast cancer, synchronous grade 2, T2 N0, ER/PR positive, and HER2 negative. She began adjuvant Arimidex on 09/15/2010,completed February 2017 9. Inflammatory changes of both lungs on a CT of the chest 11/24/2010-progressive on the restaging CT 03/03/2011, status post a bronchoscopy 03/17/2011 with no evidence of granulomata or tumor. 10. 11 mm spiculated lesion in the lingula on a CT 11/24/2010-less distinct on the CT 03/03/2011. 11. Upper respiratory infection while visiting her son in [place], June 2013, resolved after treatment with Levaquin/steroids 12. Upper respiratory infection,? Pneumonia summer 2014-chest x-ray 02/17/2013 with bilateral infiltrates 13. CT chest abdomen and pelvis on 11/29/2014 with no evidence of active lymphoma or metastatic breast cancer. Previously demonstrated retroperitoneal process appeared improved with probable residual fibrosis. No discrete adenopathy. Interval near complete resolution of patchy airspace opacities in both lungs. 14. Mild swelling and pain at the right lower leg 08/06/2016-negative for acute deep vein thrombosis, possible chronic thrombus in the right popliteal vein 15. Admission July 2018 with GI bleeding, no source for blood loss found on an upper endoscopy   Disposition: [name] appears unchanged.  She remains in clinical remission from breast cancer and non-Hodgkin's lymphoma.    Her weight is better as compared to 2 months ago.  She will return for lab and follow-up in 6 months.  Patient seen with Dr. Benay Spice.      [name] ANP/GNP-BC   02/28/2018  12:28 PM  This was a  shared visit with [name].  There is no clinical evidence of current breast cancer or  lymphoma.  She will return for an office visit in 6 months.  [name] Manson, MD

## 2018-03-04 ENCOUNTER — Telehealth: Payer: Self-pay | Admitting: Neurology

## 2018-03-04 MED ORDER — SERTRALINE HCL 100 MG PO TABS: 100.0000 mg | ORAL_TABLET | Freq: Every day | ORAL | 1 refills | Status: AC

## 2018-03-04 NOTE — Telephone Encounter (Signed)
Increase zoloft 100 mg daily

## 2018-03-04 NOTE — Telephone Encounter (Signed)
Spoke with patient's husband. He states that patient is getting worse. He isn't sure if her anxiety/depression medicine needs increased. He states she seems more depressed due to back pain/urine issues. She sees pain management for back and having bladder stim at the end of the month. He states she is "not doing things right". She can't figure out how to dress herself. Memory is worse. Hallucinations are the same. I asked him about encouraging her to write to help with memory as dicussed at her last appt, but he states when she tries he can't get her to concentrate enough to follow through with this. Please advise.

## 2018-03-04 NOTE — Telephone Encounter (Signed)
Left message on machine for patient's husband to call back. Increased medication sent to the pharmacy.

## 2018-03-04 NOTE — Telephone Encounter (Signed)
Patient's husband called and wanted to talk to Luvenia Starch about his wife. Please call him at [phone]. Thanks!

## 2018-03-07 NOTE — Telephone Encounter (Signed)
Patient's husband made aware.  

## 2018-03-11 DIAGNOSIS — R350 Frequency of micturition: Secondary | ICD-10-CM | POA: Diagnosis not present

## 2018-03-16 DIAGNOSIS — M4150 Other secondary scoliosis, site unspecified: Secondary | ICD-10-CM | POA: Diagnosis not present

## 2018-03-21 ENCOUNTER — Other Ambulatory Visit: Payer: Self-pay

## 2018-03-21 ENCOUNTER — Emergency Department (HOSPITAL_COMMUNITY)
Admission: EM | Admit: 2018-03-21 | Discharge: 2018-03-21 | Disposition: A | Discharge status: Home / Self Care | Payer: Medicare Other | Attending: Emergency Medicine | Admitting: Emergency Medicine

## 2018-03-21 DIAGNOSIS — Z9013 Acquired absence of bilateral breasts and nipples: Secondary | ICD-10-CM | POA: Diagnosis not present

## 2018-03-21 DIAGNOSIS — E119 Type 2 diabetes mellitus without complications: Secondary | ICD-10-CM | POA: Diagnosis not present

## 2018-03-21 DIAGNOSIS — R443 Hallucinations, unspecified: Secondary | ICD-10-CM

## 2018-03-21 DIAGNOSIS — J45909 Unspecified asthma, uncomplicated: Secondary | ICD-10-CM | POA: Diagnosis not present

## 2018-03-21 DIAGNOSIS — Z79899 Other long term (current) drug therapy: Secondary | ICD-10-CM | POA: Insufficient documentation

## 2018-03-21 DIAGNOSIS — G20 Parkinson's disease: Secondary | ICD-10-CM | POA: Insufficient documentation

## 2018-03-21 DIAGNOSIS — Z853 Personal history of malignant neoplasm of breast: Secondary | ICD-10-CM | POA: Diagnosis not present

## 2018-03-21 DIAGNOSIS — R350 Frequency of micturition: Secondary | ICD-10-CM | POA: Insufficient documentation

## 2018-03-21 LAB — URINALYSIS, ROUTINE W REFLEX MICROSCOPIC
BILIRUBIN URINE: NEGATIVE
Glucose, UA: NEGATIVE mg/dL
Hgb urine dipstick: NEGATIVE
KETONES UR: NEGATIVE mg/dL
Leukocytes, UA: NEGATIVE
NITRITE: NEGATIVE
PH: 6 (ref 5.0–8.0)
PROTEIN: NEGATIVE mg/dL
Specific Gravity, Urine: 1.014 (ref 1.005–1.030)

## 2018-03-21 LAB — CBC WITH DIFFERENTIAL/PLATELET
BASOS ABS: 0.1 10*3/uL (ref 0.0–0.1)
Basophils Relative: 1 %
Eosinophils Absolute: 0.1 10*3/uL (ref 0.0–0.7)
Eosinophils Relative: 2 %
HEMATOCRIT: 28.9 % — AB (ref 36.0–46.0)
Hemoglobin: 9.8 g/dL — ABNORMAL LOW (ref 12.0–15.0)
Lymphocytes Relative: 13 %
Lymphs Abs: 0.5 10*3/uL — ABNORMAL LOW (ref 0.7–4.0)
MCH: 30.1 pg (ref 26.0–34.0)
MCHC: 33.9 g/dL (ref 30.0–36.0)
MCV: 88.7 fL (ref 78.0–100.0)
MONO ABS: 0.4 10*3/uL (ref 0.1–1.0)
MONOS PCT: 9 %
NEUTROS ABS: 3.2 10*3/uL (ref 1.7–7.7)
NEUTROS PCT: 75 %
PLATELETS: 192 10*3/uL (ref 150–400)
RBC: 3.26 MIL/uL — ABNORMAL LOW (ref 3.87–5.11)
RDW: 13.7 % (ref 11.5–15.5)
WBC: 4.3 10*3/uL (ref 4.0–10.5)

## 2018-03-21 LAB — COMPREHENSIVE METABOLIC PANEL
ALK PHOS: 83 U/L (ref 38–126)
ALT: 8 U/L (ref 0–44)
ANION GAP: 9 (ref 5–15)
AST: 23 U/L (ref 15–41)
Albumin: 3.4 g/dL — ABNORMAL LOW (ref 3.5–5.0)
BILIRUBIN TOTAL: 0.5 mg/dL (ref 0.3–1.2)
BUN: 21 mg/dL (ref 8–23)
CO2: 28 mmol/L (ref 22–32)
CREATININE: 1.01 mg/dL — AB (ref 0.44–1.00)
Calcium: 8.9 mg/dL (ref 8.9–10.3)
Chloride: 101 mmol/L (ref 98–111)
GFR calc non Af Amer: 48 mL/min — ABNORMAL LOW (ref >60)
GFR, EST AFRICAN AMERICAN: 56 mL/min — AB (ref >60)
GLUCOSE: 119 mg/dL — AB (ref 70–99)
Potassium: 3.9 mmol/L (ref 3.5–5.1)
Sodium: 138 mmol/L (ref 135–145)
TOTAL PROTEIN: 6 g/dL — AB (ref 6.5–8.1)

## 2018-03-21 MED ORDER — SODIUM CHLORIDE 0.9 % IV BOLUS
500.0000 mL | Freq: Once | INTRAVENOUS | Status: AC
  Administered 2018-03-21: 500 mL via INTRAVENOUS

## 2018-03-21 NOTE — ED Triage Notes (Signed)
Patient here with c/o hallucinations. Per husband, patient has been seeing things that are not there. Husband states patients urine is dark yellow. Hx UTIs. Patient reports polyuria, denies dysuria, AXOX3 in triage.

## 2018-03-21 NOTE — ED Provider Notes (Addendum)
[facility] DEPT Provider Note   CSN: [id number] Arrival date & time: 03/21/18  1552     History   Chief Complaint Chief Complaint  Patient presents with  . Hallucinations  . Urinary Frequency    HPI [name] is a 84 y.o. female.  The history is provided by the patient and the spouse. No language interpreter was used.  Urinary Frequency    [name] is a 84 y.o. female who presents to the Emergency Department complaining of hallucinations, dark urine.  Level V caveat due to confusion. History is provided by the patient and her husband. She presents to the emergency department complaining of increased hallucinations over the last 4 to 5 days. Her husband reports that she is been sleeping more with dark urine. He states that she is been at times thinking that her son is in the room when he is not. He states that he she has a history of hallucinations in the past and similar symptoms with UTIs. She denies any fevers, chest pain, abdominal pain, dysuria. She has occasional emesis, last episode two weeks ago. She does have poor appetite and poor oral intake. She does endorse increased thirst but is not drinking more fluids. No recent medication changes. Past Medical History:  Diagnosis Date  . Anxiety   . Asthma   . Depression   . Diabetes mellitus without complication ([place])    3/97/67.Marland KitchenMarland Kitchenpt denies  . GERD (gastroesophageal reflux disease)   . Hearing loss   . NHL (non-Hodgkin's lymphoma) ([place])    nhl dx 9/04 breast ca dx1/12  . Parkinson disease [facility])     Patient Active Problem List   Diagnosis Date Noted  . GIB (gastrointestinal bleeding) 02/03/2017  . CAP (community acquired pneumonia) 04/13/2016  . PNA (pneumonia) 04/13/2016  . Murmur 09/02/2015  . Parkinson disease ([place]) 09/02/2015  . Bilateral carotid bruits 09/02/2015  . Confusion 08/07/2015  . Acute encephalopathy 08/07/2015  . UTI (lower urinary tract infection)  08/07/2015  . Acute kidney failure ([place]) 08/07/2015  . Paralysis agitans ([place]) 09/19/2012  . Lymphoma ([place]) 07/03/2011  . Malignant neoplasm of female breast ([place]) 07/03/2011  . Abnormal CT of the chest 03/13/2011  . HYPERTENSION, BENIGN 01/01/2009  . EDEMA 01/01/2009    Past Surgical History:  Procedure Laterality Date  . Ba-HA Ear implant    . ESOPHAGOGASTRODUODENOSCOPY N/A 02/04/2017   Procedure: ESOPHAGOGASTRODUODENOSCOPY (EGD);  Surgeon: Arta Silence, MD;  Location: [facility];  Service: Endoscopy;  Laterality: N/A;  . MASTECTOMY  2 /8/ 12   bilateral  . MASTECTOMY       OB History   None      Home Medications    Prior to Admission medications   Medication Sig Start Date End Date Taking? Authorizing Provider  acetaminophen (TYLENOL) 325 MG tablet Take 650 mg by mouth every 6 (six) hours as needed for mild pain, moderate pain, fever or headache.    Yes [provider]  acyclovir (ZOVIRAX) 400 MG tablet TAKE 1 TABLET BY MOUTH TWICE DAILY 10/14/17  Yes [name], MD  Calcium Carbonate-Vitamin D3 (CALCIUM 600-D) 600-400 MG-UNIT TABS Take 1 tablet by mouth at bedtime.   Yes [provider]  carbidopa-levodopa (SINEMET IR) 25-100 MG tablet TAKE 1 TABLET BY MOUTH DAILY IN THE MORNING, 1 IN THE AFTERNOON, AND 2 TABLETS IN THE EVENING 01/31/18  Yes Tat, Eustace Quail, DO  cetirizine (ZYRTEC) 10 MG tablet Take 10 mg by mouth daily at 12 noon.  Yes [provider]  cholecalciferol (VITAMIN D) 1000 units tablet Take 1,000 Units by mouth every evening.    Yes [provider]  clobetasol (TEMOVATE) 0.05 % external solution Apply 1 application topically daily.    Yes [provider]  donepezil (ARICEPT) 10 MG tablet Take 1 tablet (10 mg total) by mouth daily. 12/16/17  Yes Tat, Eustace Quail, DO  entacapone (COMTAN) 200 MG tablet TAKE 1 TABLET BY MOUTH 3 TIMES DAILY WITH SINEMET Patient taking differently: Take 200 mg by mouth 3 (three) times  daily.  10/11/17  Yes Tat, Eustace Quail, DO  Fluticasone-Salmeterol (ADVAIR) 250-50 MCG/DOSE AEPB Inhale 1 puff into the lungs 2 (two) times daily.   Yes [provider]  ketoconazole (NIZORAL) 2 % shampoo Apply 1 application topically 2 (two) times a week.    Yes [provider]  mirabegron ER (MYRBETRIQ) 50 MG TB24 tablet Take 50 mg by mouth daily at 12 noon.    Yes [provider]  Multiple Vitamins-Minerals (MULTIVITAMIN WITH MINERALS) tablet Take 1 tablet by mouth daily with breakfast.    Yes [provider]  ondansetron (ZOFRAN) 8 MG tablet Take 8 mg by mouth at bedtime as needed for nausea or vomiting.    Yes [provider]  sertraline (ZOLOFT) 100 MG tablet Take 1 tablet (100 mg total) by mouth daily. 03/04/18  Yes Tat, Eustace Quail, DO  pantoprazole (PROTONIX) 40 MG tablet Take 40 mg by mouth daily.     [provider]    Family History Family History  Problem Relation Age of Onset  . Lung cancer Father   . Cancer Father        lung  . Cancer Mother     Social History Social History   Tobacco Use  . Smoking status: Never Smoker  . Smokeless tobacco: Never Used  . Tobacco comment: husband wsa smoker  Substance Use Topics  . Alcohol use: No    Comment: no  . Drug use: No     Allergies   Patient has no known allergies.   Review of Systems Review of Systems  Genitourinary: Positive for frequency.  All other systems reviewed and are negative.    Physical Exam Updated Vital Signs BP (!) 187/85   Pulse 80   Temp 98 F (36.7 C) (Oral)   Resp 15   LMP  (LMP Unknown)   SpO2 97%   Physical Exam  Constitutional: She appears well-developed and well-nourished.  HENT:  Head: Normocephalic and atraumatic.  Cardiovascular: Normal rate and regular rhythm.  Murmur heard. Pulmonary/Chest: Effort normal and breath sounds normal. No respiratory distress.  Abdominal: Soft. There is no tenderness. There is no rebound and no  guarding.  Genitourinary:  Genitourinary Comments: Nontender rectal exam, brown stool in rectal vault.  Musculoskeletal: She exhibits no edema or tenderness.  Neurological: She is alert.  Moderately confused.  Disoriented to place and time.  5/5 strength in all four extremities with sensation to light touch intact in all four extremities.    Skin: Skin is warm and dry.  Psychiatric: She has a normal mood and affect. Her behavior is normal.  Nursing note and vitals reviewed.    ED Treatments / Results  Labs (all labs ordered are listed, but only abnormal results are displayed) Labs Reviewed  COMPREHENSIVE METABOLIC PANEL - Abnormal; Notable for the following components:      Result Value   Glucose, Bld 119 (*)    Creatinine, Ser 1.01 (*)  Total Protein 6.0 (*)    Albumin 3.4 (*)    GFR calc non Af Amer 48 (*)    GFR calc Af Amer 56 (*)    All other components within normal limits  CBC WITH DIFFERENTIAL/PLATELET - Abnormal; Notable for the following components:   RBC 3.26 (*)    Hemoglobin 9.8 (*)    HCT 28.9 (*)    Lymphs Abs 0.5 (*)    All other components within normal limits  URINALYSIS, ROUTINE W REFLEX MICROSCOPIC - Abnormal; Notable for the following components:   Color, Urine AMBER (*)    All other components within normal limits  URINE CULTURE    EKG None  Radiology No results found.  Procedures Procedures (including critical care time)  Medications Ordered in ED Medications  sodium chloride 0.9 % bolus 500 mL (0 mLs Intravenous Stopped 03/21/18 1843)     Initial Impression / Assessment and Plan / ED Course  I have reviewed the triage vital signs and the nursing notes.  Pertinent labs & imaging results that were available during my care of the patient were reviewed by me and considered in my medical decision making (see chart for details).     Patient with history of Parkinson's disease, breast cancer here for evaluation of hallucinations over the  last few days as well as orange urine and increased confusion. She is non-toxic appearing on examination. She is confused with no focal neurologic deficits. UA is amber in color but no evidence of acute infection, will send for culture and treat if positive. She is non-toxic and well hydrated. No evidence of acute delirium at this time. Presentation is not consistent with CVA. Question if recent tramadol prescription contributing to her confusion. Recommend discontinuing this medication. Plan to discharge home with close outpatient follow-up and return precautions.  Records reviewed in epic.  CBC does note anemia compared to prior with a hemoglobin of 9.8. Patient is unsure if she is having hematochezia or melanotic stools. Rectal examination with brown stool, he was negative.  Final Clinical Impressions(s) / ED Diagnoses   Final diagnoses:  Hallucinations    ED Discharge Orders    None       Quintella Reichert, MD 03/21/18 Holli Humbles    Quintella Reichert, MD 03/21/18 2133

## 2018-03-21 NOTE — Discharge Instructions (Signed)
The cause of [name]'s symptoms was not identified today. The tramadol may be contributing to her symptoms, please discontinue this medication. Please have her rechecked by her family doctor in the next few days. However seen sooner if she developed fevers, new weakness or new concerning symptoms.

## 2018-03-22 ENCOUNTER — Telehealth: Payer: Self-pay | Admitting: Physical Therapy

## 2018-03-22 ENCOUNTER — Ambulatory Visit: Payer: Medicare Other

## 2018-03-22 ENCOUNTER — Ambulatory Visit: Payer: Medicare Other | Attending: Family Medicine | Admitting: Occupational Therapy

## 2018-03-22 ENCOUNTER — Ambulatory Visit: Payer: Medicare Other | Admitting: Physical Therapy

## 2018-03-22 DIAGNOSIS — R471 Dysarthria and anarthria: Secondary | ICD-10-CM | POA: Insufficient documentation

## 2018-03-22 DIAGNOSIS — R41841 Cognitive communication deficit: Secondary | ICD-10-CM

## 2018-03-22 DIAGNOSIS — G20 Parkinson's disease: Secondary | ICD-10-CM

## 2018-03-22 DIAGNOSIS — R2689 Other abnormalities of gait and mobility: Secondary | ICD-10-CM | POA: Insufficient documentation

## 2018-03-22 DIAGNOSIS — R29818 Other symptoms and signs involving the nervous system: Secondary | ICD-10-CM | POA: Insufficient documentation

## 2018-03-22 DIAGNOSIS — R1312 Dysphagia, oropharyngeal phase: Secondary | ICD-10-CM | POA: Insufficient documentation

## 2018-03-22 NOTE — Therapy (Signed)
[facility] [address] [place], [name], [address] Phone: [phone]   Fax:  [phone]  Patient Details  Name: [name] MRN: [id number] Date of Birth: 07-23-1928 Referring Provider:  [name], MD  Encounter Date: 03/22/2018   Physical Therapy Parkinson's Disease Screen   Timed Up and Go test: 32.94 sec   10 meter walk test:40.81 sec (0.8 ft/sec)  5 time sit to stand test:NT  Patient would benefit from Physical Therapy evaluation due to slowed gait speed, slowed mobility measures, fall risk per TUG and gait velocity.       Jaquae Rieves W. 03/22/2018, 1:34 PM  Mady Haagensen, PT 03/22/18 1:52 PM Phone: [phone] Fax: [facility] [address] [place], [name], [address] Phone: [phone]   Fax:  [phone]

## 2018-03-22 NOTE — Therapy (Signed)
[facility] [address] [place], [name], [address] Phone: [phone]   Fax:  [phone]  Patient Details  Name: [name] MRN: [id number] Date of Birth: 1928-02-11 Referring Provider:  [name], MD  Encounter Date: 03/22/2018   Occupational Therapy Parkinson's Disease Screen  Physical Performance Test item #4 (donning/doffing jacket):  >27min with min-mod cueing and min A  Change in ability to perform ADLs/IADLs:  Yes.  Husband having to help pt bathe and dress now due to confusion and back pain.   Pt demo difficulty following directions for functional tasks.  Other Comments:  Husband reports incr memory deficits and confusion over last 3 weeks.  Husband reports pt is focused on back pain.  Pt was in ED yesterday due to hallucinations.    Pt would benefit from occupational therapy evaluation due to  Recent change/decline in ADLs.    [facility] 03/22/2018, 2:36 PM  [facility] [address] [place], [name], [address] Phone: [phone]   Fax:  [place], OTR/L [facility] [address] [place], [place]  [phone] phone [phone] 03/22/18 2:36 PM

## 2018-03-22 NOTE — Telephone Encounter (Signed)
That is okay IF her husband is agreeable.  I think that he likes her to get out.

## 2018-03-22 NOTE — Therapy (Addendum)
[facility] [address] [place], [name], [address] Phone: [phone]   Fax:  [phone]  Patient Details  Name: [name] MRN: [id number] Date of Birth: 06/08/1928 Referring Provider: Alonza Bogus, DO  Encounter Date: 03/22/2018  Speech Therapy Parkinson's Disease Screen   Decibel Level today: 70dB  (WNL=70-72 dB) with sound level meter 30cm away from pt's mouth. Pt's conversational volume has remained much like d/c at since last treatment course.  Husband states "I don't think she listens. She hears me but I don't think she listens to me." Pt's husband indicated pt needs to work on hearing him, because he needs to repeat to her multiple times for her to understand, he feels. In this setting of a screening, there was not time to address cognitive changes in PD and how this is likely currently affecting pt, but even more acutely given these more recent cognitive changes.  Pt/husband do not report any difficulty in swallowing warranting objective evaluation.  Pt would benefit from speech-language eval, [facility], given pt's confusion and significant decline in cognition/mentation from previous outpatient visits, for enhancing communication episodes between pt/husband.  At this time pt's swallow skills appear functional. Aspiration pneumonia signs and symptoms were provided to pt/husband.   [facility] ,[place], [place]  03/22/2018, 1:33 PM  [facility] [address] [place], [name], [address] Phone: [phone]   Fax:  [phone]

## 2018-03-22 NOTE — Patient Instructions (Signed)
Signs of Aspiration Pneumonia   . Chest pain/tightness . Fever (can be low grade) . Cough  o With foul-smelling phlegm (sputum) o With sputum containing pus or blood o With greenish sputum . Fatigue  . Shortness of breath  . Wheezing   **IF YOU HAVE THESE SIGNS, CONTACT YOUR DOCTOR OR GO TO THE EMERGENCY DEPARTMENT OR URGENT CARE AS SOON AS POSSIBLE**      

## 2018-03-22 NOTE — Telephone Encounter (Signed)
Please advise if you would like me to order home health.

## 2018-03-22 NOTE — Telephone Encounter (Signed)
Dr. Marchia Meiers was seen for PT, OT, speech therapy screens today.  All 3 disciplines recommend therapy evaluations-OT, speech, and physical therapy due to significant decline in mobility/high fall risk, difficulty with ADLs, and need for communication strategies.  She has had a recent ED visit and has difficulty today at screen with following basic directions and sequencing basic tasks.  Upon discussion between our disciplines, we question if recommendation for PT, OT, and speech therapy would be best for home health environment.  If you agree, please order PT, OT, and speech therapy.  Thank you.  Mady Haagensen, PT

## 2018-03-23 LAB — URINE CULTURE: Culture: <10000 — AB

## 2018-03-23 NOTE — Telephone Encounter (Signed)
Spoke with husband.  He would like to have home health therapies. Patient has really declined and he does also want to get back in with Dr. [name] asap. Patient on cancellation list. Referral sent to [facility]

## 2018-03-25 DIAGNOSIS — F0280 Dementia in other diseases classified elsewhere without behavioral disturbance: Secondary | ICD-10-CM | POA: Diagnosis not present

## 2018-03-25 DIAGNOSIS — F329 Major depressive disorder, single episode, unspecified: Secondary | ICD-10-CM | POA: Diagnosis not present

## 2018-03-25 DIAGNOSIS — G20 Parkinson's disease: Secondary | ICD-10-CM | POA: Diagnosis not present

## 2018-03-25 DIAGNOSIS — Z8572 Personal history of non-Hodgkin lymphomas: Secondary | ICD-10-CM | POA: Diagnosis not present

## 2018-03-25 DIAGNOSIS — F419 Anxiety disorder, unspecified: Secondary | ICD-10-CM | POA: Diagnosis not present

## 2018-03-25 DIAGNOSIS — M545 Low back pain: Secondary | ICD-10-CM | POA: Diagnosis not present

## 2018-03-25 DIAGNOSIS — K219 Gastro-esophageal reflux disease without esophagitis: Secondary | ICD-10-CM | POA: Diagnosis not present

## 2018-03-25 DIAGNOSIS — E119 Type 2 diabetes mellitus without complications: Secondary | ICD-10-CM | POA: Diagnosis not present

## 2018-03-25 DIAGNOSIS — G8929 Other chronic pain: Secondary | ICD-10-CM | POA: Diagnosis not present

## 2018-03-25 DIAGNOSIS — I10 Essential (primary) hypertension: Secondary | ICD-10-CM | POA: Diagnosis not present

## 2018-03-31 ENCOUNTER — Telehealth: Payer: Self-pay | Admitting: Neurology

## 2018-03-31 DIAGNOSIS — H01002 Unspecified blepharitis right lower eyelid: Secondary | ICD-10-CM | POA: Diagnosis not present

## 2018-03-31 DIAGNOSIS — H35373 Puckering of macula, bilateral: Secondary | ICD-10-CM | POA: Diagnosis not present

## 2018-03-31 DIAGNOSIS — H353131 Nonexudative age-related macular degeneration, bilateral, early dry stage: Secondary | ICD-10-CM | POA: Diagnosis not present

## 2018-03-31 DIAGNOSIS — H01004 Unspecified blepharitis left upper eyelid: Secondary | ICD-10-CM | POA: Diagnosis not present

## 2018-03-31 DIAGNOSIS — H524 Presbyopia: Secondary | ICD-10-CM | POA: Diagnosis not present

## 2018-03-31 DIAGNOSIS — H01001 Unspecified blepharitis right upper eyelid: Secondary | ICD-10-CM | POA: Diagnosis not present

## 2018-03-31 DIAGNOSIS — H01005 Unspecified blepharitis left lower eyelid: Secondary | ICD-10-CM | POA: Diagnosis not present

## 2018-03-31 NOTE — Telephone Encounter (Signed)
JUST TO INFORM YOU: Lennette Bihari was calling from [facility] to inform you that the patient had a delay in occupational therapy appts this week because of the holiday and that she has an appt next week. If you need to call him back it's [phone] ext. [phone]. Thanks!

## 2018-03-31 NOTE — Telephone Encounter (Signed)
Noted  

## 2018-04-01 DIAGNOSIS — G20 Parkinson's disease: Secondary | ICD-10-CM | POA: Diagnosis not present

## 2018-04-01 DIAGNOSIS — F0280 Dementia in other diseases classified elsewhere without behavioral disturbance: Secondary | ICD-10-CM | POA: Diagnosis not present

## 2018-04-01 DIAGNOSIS — G8929 Other chronic pain: Secondary | ICD-10-CM | POA: Diagnosis not present

## 2018-04-01 DIAGNOSIS — E119 Type 2 diabetes mellitus without complications: Secondary | ICD-10-CM | POA: Diagnosis not present

## 2018-04-01 DIAGNOSIS — F419 Anxiety disorder, unspecified: Secondary | ICD-10-CM | POA: Diagnosis not present

## 2018-04-01 DIAGNOSIS — M545 Low back pain: Secondary | ICD-10-CM | POA: Diagnosis not present

## 2018-04-01 DIAGNOSIS — N3946 Mixed incontinence: Secondary | ICD-10-CM | POA: Diagnosis not present

## 2018-04-02 ENCOUNTER — Other Ambulatory Visit: Payer: Self-pay | Admitting: Neurology

## 2018-04-04 ENCOUNTER — Telehealth: Payer: Self-pay | Admitting: Neurology

## 2018-04-04 DIAGNOSIS — G8929 Other chronic pain: Secondary | ICD-10-CM | POA: Diagnosis not present

## 2018-04-04 DIAGNOSIS — M545 Low back pain: Secondary | ICD-10-CM | POA: Diagnosis not present

## 2018-04-04 DIAGNOSIS — G20 Parkinson's disease: Secondary | ICD-10-CM | POA: Diagnosis not present

## 2018-04-04 DIAGNOSIS — F0280 Dementia in other diseases classified elsewhere without behavioral disturbance: Secondary | ICD-10-CM | POA: Diagnosis not present

## 2018-04-04 DIAGNOSIS — F419 Anxiety disorder, unspecified: Secondary | ICD-10-CM | POA: Diagnosis not present

## 2018-04-04 DIAGNOSIS — E119 Type 2 diabetes mellitus without complications: Secondary | ICD-10-CM | POA: Diagnosis not present

## 2018-04-04 NOTE — Telephone Encounter (Signed)
Received vm from [place] with [facility] requesting verbal orders for ST. LMOM giving orders. [phone].

## 2018-04-05 DIAGNOSIS — E119 Type 2 diabetes mellitus without complications: Secondary | ICD-10-CM | POA: Diagnosis not present

## 2018-04-05 DIAGNOSIS — F0280 Dementia in other diseases classified elsewhere without behavioral disturbance: Secondary | ICD-10-CM | POA: Diagnosis not present

## 2018-04-05 DIAGNOSIS — G20 Parkinson's disease: Secondary | ICD-10-CM | POA: Diagnosis not present

## 2018-04-05 DIAGNOSIS — F419 Anxiety disorder, unspecified: Secondary | ICD-10-CM | POA: Diagnosis not present

## 2018-04-05 DIAGNOSIS — G8929 Other chronic pain: Secondary | ICD-10-CM | POA: Diagnosis not present

## 2018-04-05 DIAGNOSIS — M545 Low back pain: Secondary | ICD-10-CM | POA: Diagnosis not present

## 2018-04-06 ENCOUNTER — Telehealth: Payer: Self-pay | Admitting: Neurology

## 2018-04-06 DIAGNOSIS — F0280 Dementia in other diseases classified elsewhere without behavioral disturbance: Secondary | ICD-10-CM | POA: Diagnosis not present

## 2018-04-06 DIAGNOSIS — M545 Low back pain: Secondary | ICD-10-CM | POA: Diagnosis not present

## 2018-04-06 DIAGNOSIS — F419 Anxiety disorder, unspecified: Secondary | ICD-10-CM | POA: Diagnosis not present

## 2018-04-06 DIAGNOSIS — E119 Type 2 diabetes mellitus without complications: Secondary | ICD-10-CM | POA: Diagnosis not present

## 2018-04-06 DIAGNOSIS — G8929 Other chronic pain: Secondary | ICD-10-CM | POA: Diagnosis not present

## 2018-04-06 DIAGNOSIS — G20 Parkinson's disease: Secondary | ICD-10-CM | POA: Diagnosis not present

## 2018-04-06 NOTE — Telephone Encounter (Signed)
[facility] left message with after hours service needing orders for PT. Please Call. Thanks

## 2018-04-06 NOTE — Telephone Encounter (Signed)
Left message with [facility] giving ok for PT and requested for him to fax the written orders to me.

## 2018-04-08 DIAGNOSIS — G20 Parkinson's disease: Secondary | ICD-10-CM | POA: Diagnosis not present

## 2018-04-08 DIAGNOSIS — F419 Anxiety disorder, unspecified: Secondary | ICD-10-CM | POA: Diagnosis not present

## 2018-04-08 DIAGNOSIS — E119 Type 2 diabetes mellitus without complications: Secondary | ICD-10-CM | POA: Diagnosis not present

## 2018-04-08 DIAGNOSIS — F0280 Dementia in other diseases classified elsewhere without behavioral disturbance: Secondary | ICD-10-CM | POA: Diagnosis not present

## 2018-04-08 DIAGNOSIS — M545 Low back pain: Secondary | ICD-10-CM | POA: Diagnosis not present

## 2018-04-08 DIAGNOSIS — G8929 Other chronic pain: Secondary | ICD-10-CM | POA: Diagnosis not present

## 2018-04-09 ENCOUNTER — Other Ambulatory Visit: Payer: Self-pay | Admitting: Oncology

## 2018-04-11 DIAGNOSIS — F419 Anxiety disorder, unspecified: Secondary | ICD-10-CM | POA: Diagnosis not present

## 2018-04-11 DIAGNOSIS — M4150 Other secondary scoliosis, site unspecified: Secondary | ICD-10-CM | POA: Diagnosis not present

## 2018-04-11 DIAGNOSIS — E119 Type 2 diabetes mellitus without complications: Secondary | ICD-10-CM | POA: Diagnosis not present

## 2018-04-11 DIAGNOSIS — M545 Low back pain: Secondary | ICD-10-CM | POA: Diagnosis not present

## 2018-04-11 DIAGNOSIS — F0280 Dementia in other diseases classified elsewhere without behavioral disturbance: Secondary | ICD-10-CM | POA: Diagnosis not present

## 2018-04-11 DIAGNOSIS — G8929 Other chronic pain: Secondary | ICD-10-CM | POA: Diagnosis not present

## 2018-04-11 DIAGNOSIS — G20 Parkinson's disease: Secondary | ICD-10-CM | POA: Diagnosis not present

## 2018-04-12 DIAGNOSIS — F419 Anxiety disorder, unspecified: Secondary | ICD-10-CM | POA: Diagnosis not present

## 2018-04-12 DIAGNOSIS — M545 Low back pain: Secondary | ICD-10-CM | POA: Diagnosis not present

## 2018-04-12 DIAGNOSIS — G20 Parkinson's disease: Secondary | ICD-10-CM | POA: Diagnosis not present

## 2018-04-12 DIAGNOSIS — G8929 Other chronic pain: Secondary | ICD-10-CM | POA: Diagnosis not present

## 2018-04-12 DIAGNOSIS — F0280 Dementia in other diseases classified elsewhere without behavioral disturbance: Secondary | ICD-10-CM | POA: Diagnosis not present

## 2018-04-12 DIAGNOSIS — E119 Type 2 diabetes mellitus without complications: Secondary | ICD-10-CM | POA: Diagnosis not present

## 2018-04-13 DIAGNOSIS — F0280 Dementia in other diseases classified elsewhere without behavioral disturbance: Secondary | ICD-10-CM | POA: Diagnosis not present

## 2018-04-13 DIAGNOSIS — F419 Anxiety disorder, unspecified: Secondary | ICD-10-CM | POA: Diagnosis not present

## 2018-04-13 DIAGNOSIS — M545 Low back pain: Secondary | ICD-10-CM | POA: Diagnosis not present

## 2018-04-13 DIAGNOSIS — E119 Type 2 diabetes mellitus without complications: Secondary | ICD-10-CM | POA: Diagnosis not present

## 2018-04-13 DIAGNOSIS — G20 Parkinson's disease: Secondary | ICD-10-CM | POA: Diagnosis not present

## 2018-04-13 DIAGNOSIS — G8929 Other chronic pain: Secondary | ICD-10-CM | POA: Diagnosis not present

## 2018-04-14 DIAGNOSIS — F419 Anxiety disorder, unspecified: Secondary | ICD-10-CM | POA: Diagnosis not present

## 2018-04-14 DIAGNOSIS — E119 Type 2 diabetes mellitus without complications: Secondary | ICD-10-CM | POA: Diagnosis not present

## 2018-04-14 DIAGNOSIS — M545 Low back pain: Secondary | ICD-10-CM | POA: Diagnosis not present

## 2018-04-14 DIAGNOSIS — F0280 Dementia in other diseases classified elsewhere without behavioral disturbance: Secondary | ICD-10-CM | POA: Diagnosis not present

## 2018-04-14 DIAGNOSIS — G8929 Other chronic pain: Secondary | ICD-10-CM | POA: Diagnosis not present

## 2018-04-14 DIAGNOSIS — G20 Parkinson's disease: Secondary | ICD-10-CM | POA: Diagnosis not present

## 2018-04-15 DIAGNOSIS — M545 Low back pain: Secondary | ICD-10-CM | POA: Diagnosis not present

## 2018-04-15 DIAGNOSIS — F0280 Dementia in other diseases classified elsewhere without behavioral disturbance: Secondary | ICD-10-CM | POA: Diagnosis not present

## 2018-04-15 DIAGNOSIS — G20 Parkinson's disease: Secondary | ICD-10-CM | POA: Diagnosis not present

## 2018-04-15 DIAGNOSIS — F419 Anxiety disorder, unspecified: Secondary | ICD-10-CM | POA: Diagnosis not present

## 2018-04-15 DIAGNOSIS — E119 Type 2 diabetes mellitus without complications: Secondary | ICD-10-CM | POA: Diagnosis not present

## 2018-04-15 DIAGNOSIS — G8929 Other chronic pain: Secondary | ICD-10-CM | POA: Diagnosis not present

## 2018-04-18 ENCOUNTER — Other Ambulatory Visit: Payer: Self-pay | Admitting: Neurology

## 2018-04-18 DIAGNOSIS — F419 Anxiety disorder, unspecified: Secondary | ICD-10-CM | POA: Diagnosis not present

## 2018-04-18 DIAGNOSIS — F0280 Dementia in other diseases classified elsewhere without behavioral disturbance: Secondary | ICD-10-CM | POA: Diagnosis not present

## 2018-04-18 DIAGNOSIS — G20 Parkinson's disease: Secondary | ICD-10-CM | POA: Diagnosis not present

## 2018-04-18 DIAGNOSIS — M545 Low back pain: Secondary | ICD-10-CM | POA: Diagnosis not present

## 2018-04-18 DIAGNOSIS — G8929 Other chronic pain: Secondary | ICD-10-CM | POA: Diagnosis not present

## 2018-04-18 DIAGNOSIS — E119 Type 2 diabetes mellitus without complications: Secondary | ICD-10-CM | POA: Diagnosis not present

## 2018-04-19 DIAGNOSIS — F419 Anxiety disorder, unspecified: Secondary | ICD-10-CM | POA: Diagnosis not present

## 2018-04-19 DIAGNOSIS — G20 Parkinson's disease: Secondary | ICD-10-CM | POA: Diagnosis not present

## 2018-04-19 DIAGNOSIS — G8929 Other chronic pain: Secondary | ICD-10-CM | POA: Diagnosis not present

## 2018-04-19 DIAGNOSIS — M545 Low back pain: Secondary | ICD-10-CM | POA: Diagnosis not present

## 2018-04-19 DIAGNOSIS — F0280 Dementia in other diseases classified elsewhere without behavioral disturbance: Secondary | ICD-10-CM | POA: Diagnosis not present

## 2018-04-19 DIAGNOSIS — E119 Type 2 diabetes mellitus without complications: Secondary | ICD-10-CM | POA: Diagnosis not present

## 2018-04-20 DIAGNOSIS — I10 Essential (primary) hypertension: Secondary | ICD-10-CM | POA: Diagnosis not present

## 2018-04-20 DIAGNOSIS — K219 Gastro-esophageal reflux disease without esophagitis: Secondary | ICD-10-CM | POA: Diagnosis not present

## 2018-04-20 DIAGNOSIS — E119 Type 2 diabetes mellitus without complications: Secondary | ICD-10-CM | POA: Diagnosis not present

## 2018-04-20 DIAGNOSIS — Z23 Encounter for immunization: Secondary | ICD-10-CM | POA: Diagnosis not present

## 2018-04-20 DIAGNOSIS — G20 Parkinson's disease: Secondary | ICD-10-CM | POA: Diagnosis not present

## 2018-04-20 DIAGNOSIS — N183 Chronic kidney disease, stage 3 (moderate): Secondary | ICD-10-CM | POA: Diagnosis not present

## 2018-04-20 DIAGNOSIS — E7800 Pure hypercholesterolemia, unspecified: Secondary | ICD-10-CM | POA: Diagnosis not present

## 2018-04-20 DIAGNOSIS — R413 Other amnesia: Secondary | ICD-10-CM | POA: Diagnosis not present

## 2018-04-20 DIAGNOSIS — Z853 Personal history of malignant neoplasm of breast: Secondary | ICD-10-CM | POA: Diagnosis not present

## 2018-04-20 DIAGNOSIS — Z8572 Personal history of non-Hodgkin lymphomas: Secondary | ICD-10-CM | POA: Diagnosis not present

## 2018-04-20 DIAGNOSIS — G8929 Other chronic pain: Secondary | ICD-10-CM | POA: Diagnosis not present

## 2018-04-20 DIAGNOSIS — F419 Anxiety disorder, unspecified: Secondary | ICD-10-CM | POA: Diagnosis not present

## 2018-04-20 DIAGNOSIS — N3281 Overactive bladder: Secondary | ICD-10-CM | POA: Diagnosis not present

## 2018-04-20 DIAGNOSIS — M545 Low back pain: Secondary | ICD-10-CM | POA: Diagnosis not present

## 2018-04-20 DIAGNOSIS — F0280 Dementia in other diseases classified elsewhere without behavioral disturbance: Secondary | ICD-10-CM | POA: Diagnosis not present

## 2018-04-22 DIAGNOSIS — F0280 Dementia in other diseases classified elsewhere without behavioral disturbance: Secondary | ICD-10-CM | POA: Diagnosis not present

## 2018-04-22 DIAGNOSIS — G20 Parkinson's disease: Secondary | ICD-10-CM | POA: Diagnosis not present

## 2018-04-22 DIAGNOSIS — G8929 Other chronic pain: Secondary | ICD-10-CM | POA: Diagnosis not present

## 2018-04-22 DIAGNOSIS — F419 Anxiety disorder, unspecified: Secondary | ICD-10-CM | POA: Diagnosis not present

## 2018-04-22 DIAGNOSIS — R350 Frequency of micturition: Secondary | ICD-10-CM | POA: Diagnosis not present

## 2018-04-22 DIAGNOSIS — M545 Low back pain: Secondary | ICD-10-CM | POA: Diagnosis not present

## 2018-04-22 DIAGNOSIS — E119 Type 2 diabetes mellitus without complications: Secondary | ICD-10-CM | POA: Diagnosis not present

## 2018-04-25 DIAGNOSIS — F419 Anxiety disorder, unspecified: Secondary | ICD-10-CM | POA: Diagnosis not present

## 2018-04-25 DIAGNOSIS — M545 Low back pain: Secondary | ICD-10-CM | POA: Diagnosis not present

## 2018-04-25 DIAGNOSIS — G20 Parkinson's disease: Secondary | ICD-10-CM | POA: Diagnosis not present

## 2018-04-25 DIAGNOSIS — F0280 Dementia in other diseases classified elsewhere without behavioral disturbance: Secondary | ICD-10-CM | POA: Diagnosis not present

## 2018-04-25 DIAGNOSIS — E119 Type 2 diabetes mellitus without complications: Secondary | ICD-10-CM | POA: Diagnosis not present

## 2018-04-25 DIAGNOSIS — G8929 Other chronic pain: Secondary | ICD-10-CM | POA: Diagnosis not present

## 2018-04-26 ENCOUNTER — Telehealth: Payer: Self-pay | Admitting: Neurology

## 2018-04-26 NOTE — Telephone Encounter (Signed)
Spoke with PT at [facility] and gave verbal order to continue PT 2 x a week for 4 weeks.

## 2018-04-28 DIAGNOSIS — G8929 Other chronic pain: Secondary | ICD-10-CM | POA: Diagnosis not present

## 2018-04-28 DIAGNOSIS — F419 Anxiety disorder, unspecified: Secondary | ICD-10-CM | POA: Diagnosis not present

## 2018-04-28 DIAGNOSIS — M545 Low back pain: Secondary | ICD-10-CM | POA: Diagnosis not present

## 2018-04-28 DIAGNOSIS — G20 Parkinson's disease: Secondary | ICD-10-CM | POA: Diagnosis not present

## 2018-04-28 DIAGNOSIS — F0280 Dementia in other diseases classified elsewhere without behavioral disturbance: Secondary | ICD-10-CM | POA: Diagnosis not present

## 2018-04-28 DIAGNOSIS — E119 Type 2 diabetes mellitus without complications: Secondary | ICD-10-CM | POA: Diagnosis not present

## 2018-04-29 ENCOUNTER — Telehealth: Payer: Self-pay | Admitting: Neurology

## 2018-04-29 DIAGNOSIS — E119 Type 2 diabetes mellitus without complications: Secondary | ICD-10-CM | POA: Diagnosis not present

## 2018-04-29 DIAGNOSIS — G20 Parkinson's disease: Secondary | ICD-10-CM | POA: Diagnosis not present

## 2018-04-29 DIAGNOSIS — G8929 Other chronic pain: Secondary | ICD-10-CM | POA: Diagnosis not present

## 2018-04-29 DIAGNOSIS — F0280 Dementia in other diseases classified elsewhere without behavioral disturbance: Secondary | ICD-10-CM | POA: Diagnosis not present

## 2018-04-29 DIAGNOSIS — F419 Anxiety disorder, unspecified: Secondary | ICD-10-CM | POA: Diagnosis not present

## 2018-04-29 DIAGNOSIS — M545 Low back pain: Secondary | ICD-10-CM | POA: Diagnosis not present

## 2018-04-29 NOTE — Telephone Encounter (Signed)
Gave verbal order for ST 2 x week for one more week to [place] with [facility] [phone].

## 2018-05-02 DIAGNOSIS — G20 Parkinson's disease: Secondary | ICD-10-CM | POA: Diagnosis not present

## 2018-05-02 DIAGNOSIS — F419 Anxiety disorder, unspecified: Secondary | ICD-10-CM | POA: Diagnosis not present

## 2018-05-02 DIAGNOSIS — M4150 Other secondary scoliosis, site unspecified: Secondary | ICD-10-CM | POA: Diagnosis not present

## 2018-05-02 DIAGNOSIS — G8929 Other chronic pain: Secondary | ICD-10-CM | POA: Diagnosis not present

## 2018-05-02 DIAGNOSIS — M545 Low back pain: Secondary | ICD-10-CM | POA: Diagnosis not present

## 2018-05-02 DIAGNOSIS — F0280 Dementia in other diseases classified elsewhere without behavioral disturbance: Secondary | ICD-10-CM | POA: Diagnosis not present

## 2018-05-02 DIAGNOSIS — E119 Type 2 diabetes mellitus without complications: Secondary | ICD-10-CM | POA: Diagnosis not present

## 2018-05-02 DIAGNOSIS — I10 Essential (primary) hypertension: Secondary | ICD-10-CM | POA: Diagnosis not present

## 2018-05-03 DIAGNOSIS — G20 Parkinson's disease: Secondary | ICD-10-CM | POA: Diagnosis not present

## 2018-05-03 DIAGNOSIS — F0280 Dementia in other diseases classified elsewhere without behavioral disturbance: Secondary | ICD-10-CM | POA: Diagnosis not present

## 2018-05-03 DIAGNOSIS — M545 Low back pain: Secondary | ICD-10-CM | POA: Diagnosis not present

## 2018-05-03 DIAGNOSIS — E119 Type 2 diabetes mellitus without complications: Secondary | ICD-10-CM | POA: Diagnosis not present

## 2018-05-03 DIAGNOSIS — G8929 Other chronic pain: Secondary | ICD-10-CM | POA: Diagnosis not present

## 2018-05-03 DIAGNOSIS — F419 Anxiety disorder, unspecified: Secondary | ICD-10-CM | POA: Diagnosis not present

## 2018-05-04 DIAGNOSIS — G8929 Other chronic pain: Secondary | ICD-10-CM | POA: Diagnosis not present

## 2018-05-04 DIAGNOSIS — F0280 Dementia in other diseases classified elsewhere without behavioral disturbance: Secondary | ICD-10-CM | POA: Diagnosis not present

## 2018-05-04 DIAGNOSIS — F419 Anxiety disorder, unspecified: Secondary | ICD-10-CM | POA: Diagnosis not present

## 2018-05-04 DIAGNOSIS — E119 Type 2 diabetes mellitus without complications: Secondary | ICD-10-CM | POA: Diagnosis not present

## 2018-05-04 DIAGNOSIS — G20 Parkinson's disease: Secondary | ICD-10-CM | POA: Diagnosis not present

## 2018-05-04 DIAGNOSIS — M545 Low back pain: Secondary | ICD-10-CM | POA: Diagnosis not present

## 2018-05-05 ENCOUNTER — Telehealth: Payer: Self-pay | Admitting: Neurology

## 2018-05-05 DIAGNOSIS — G20 Parkinson's disease: Secondary | ICD-10-CM | POA: Diagnosis not present

## 2018-05-05 DIAGNOSIS — M545 Low back pain: Secondary | ICD-10-CM | POA: Diagnosis not present

## 2018-05-05 DIAGNOSIS — G8929 Other chronic pain: Secondary | ICD-10-CM | POA: Diagnosis not present

## 2018-05-05 DIAGNOSIS — F0280 Dementia in other diseases classified elsewhere without behavioral disturbance: Secondary | ICD-10-CM | POA: Diagnosis not present

## 2018-05-05 DIAGNOSIS — E119 Type 2 diabetes mellitus without complications: Secondary | ICD-10-CM | POA: Diagnosis not present

## 2018-05-05 DIAGNOSIS — F419 Anxiety disorder, unspecified: Secondary | ICD-10-CM | POA: Diagnosis not present

## 2018-05-05 NOTE — Telephone Encounter (Signed)
Verbal order given to OT with [facility] to see patient 1 x week for 3 weeks.

## 2018-05-09 DIAGNOSIS — G8929 Other chronic pain: Secondary | ICD-10-CM | POA: Diagnosis not present

## 2018-05-09 DIAGNOSIS — E119 Type 2 diabetes mellitus without complications: Secondary | ICD-10-CM | POA: Diagnosis not present

## 2018-05-09 DIAGNOSIS — G20 Parkinson's disease: Secondary | ICD-10-CM | POA: Diagnosis not present

## 2018-05-09 DIAGNOSIS — F0280 Dementia in other diseases classified elsewhere without behavioral disturbance: Secondary | ICD-10-CM | POA: Diagnosis not present

## 2018-05-09 DIAGNOSIS — F419 Anxiety disorder, unspecified: Secondary | ICD-10-CM | POA: Diagnosis not present

## 2018-05-09 DIAGNOSIS — M545 Low back pain: Secondary | ICD-10-CM | POA: Diagnosis not present

## 2018-05-13 DIAGNOSIS — F0280 Dementia in other diseases classified elsewhere without behavioral disturbance: Secondary | ICD-10-CM | POA: Diagnosis not present

## 2018-05-13 DIAGNOSIS — F419 Anxiety disorder, unspecified: Secondary | ICD-10-CM | POA: Diagnosis not present

## 2018-05-13 DIAGNOSIS — G8929 Other chronic pain: Secondary | ICD-10-CM | POA: Diagnosis not present

## 2018-05-13 DIAGNOSIS — E119 Type 2 diabetes mellitus without complications: Secondary | ICD-10-CM | POA: Diagnosis not present

## 2018-05-13 DIAGNOSIS — M545 Low back pain: Secondary | ICD-10-CM | POA: Diagnosis not present

## 2018-05-13 DIAGNOSIS — G20 Parkinson's disease: Secondary | ICD-10-CM | POA: Diagnosis not present

## 2018-05-16 DIAGNOSIS — G20 Parkinson's disease: Secondary | ICD-10-CM | POA: Diagnosis not present

## 2018-05-16 DIAGNOSIS — F419 Anxiety disorder, unspecified: Secondary | ICD-10-CM | POA: Diagnosis not present

## 2018-05-16 DIAGNOSIS — M545 Low back pain: Secondary | ICD-10-CM | POA: Diagnosis not present

## 2018-05-16 DIAGNOSIS — G8929 Other chronic pain: Secondary | ICD-10-CM | POA: Diagnosis not present

## 2018-05-16 DIAGNOSIS — F0280 Dementia in other diseases classified elsewhere without behavioral disturbance: Secondary | ICD-10-CM | POA: Diagnosis not present

## 2018-05-16 DIAGNOSIS — E119 Type 2 diabetes mellitus without complications: Secondary | ICD-10-CM | POA: Diagnosis not present

## 2018-05-17 DIAGNOSIS — F419 Anxiety disorder, unspecified: Secondary | ICD-10-CM | POA: Diagnosis not present

## 2018-05-17 DIAGNOSIS — M545 Low back pain: Secondary | ICD-10-CM | POA: Diagnosis not present

## 2018-05-17 DIAGNOSIS — G8929 Other chronic pain: Secondary | ICD-10-CM | POA: Diagnosis not present

## 2018-05-17 DIAGNOSIS — F0280 Dementia in other diseases classified elsewhere without behavioral disturbance: Secondary | ICD-10-CM | POA: Diagnosis not present

## 2018-05-17 DIAGNOSIS — G20 Parkinson's disease: Secondary | ICD-10-CM | POA: Diagnosis not present

## 2018-05-17 DIAGNOSIS — E119 Type 2 diabetes mellitus without complications: Secondary | ICD-10-CM | POA: Diagnosis not present

## 2018-05-23 DIAGNOSIS — M545 Low back pain: Secondary | ICD-10-CM | POA: Diagnosis not present

## 2018-05-23 DIAGNOSIS — G8929 Other chronic pain: Secondary | ICD-10-CM | POA: Diagnosis not present

## 2018-05-23 DIAGNOSIS — F419 Anxiety disorder, unspecified: Secondary | ICD-10-CM | POA: Diagnosis not present

## 2018-05-23 DIAGNOSIS — F0280 Dementia in other diseases classified elsewhere without behavioral disturbance: Secondary | ICD-10-CM | POA: Diagnosis not present

## 2018-05-23 DIAGNOSIS — E119 Type 2 diabetes mellitus without complications: Secondary | ICD-10-CM | POA: Diagnosis not present

## 2018-05-23 DIAGNOSIS — G20 Parkinson's disease: Secondary | ICD-10-CM | POA: Diagnosis not present

## 2018-05-26 DIAGNOSIS — R208 Other disturbances of skin sensation: Secondary | ICD-10-CM | POA: Diagnosis not present

## 2018-05-26 DIAGNOSIS — Z23 Encounter for immunization: Secondary | ICD-10-CM | POA: Diagnosis not present

## 2018-05-27 DIAGNOSIS — R350 Frequency of micturition: Secondary | ICD-10-CM | POA: Diagnosis not present

## 2018-05-31 DIAGNOSIS — M4150 Other secondary scoliosis, site unspecified: Secondary | ICD-10-CM | POA: Diagnosis not present

## 2018-06-04 ENCOUNTER — Other Ambulatory Visit: Payer: Self-pay | Admitting: Oncology

## 2018-06-13 NOTE — Progress Notes (Unsigned)
[name] was seen today in the movement disorders clinic for neurologic consultation at the request of Dr. Betsy Coder.    The consultation is for the evaluation of ataxia and tremor.  The patient has been a previous pt of Dr. Erling Cruz.  Dr. Erling Cruz dx the patient with parkinsonism and ET.  This is an 84 y/o female with a complex medical hx of both non Hodgkins lymphoma require chemotherapy and breast CA, s/p bilateral mastectomy.  She has a long hx of tremor.  I reviewed Dr. Bernardo Heater notes that were made available to me.  It appears that she presented with right hand resting tremor in 2009, without associated rigidity or bradykinesia.  There were no known medications causing the problem and it appears that she was initially dx with ET and later transitioned the dx to parkinsonism.  The patient reports no tremor in the L hand.  Her husband reports a little tremor in the R leg.  The patient reports that tremor is most significant with relaxing and she has no tremor if her hands are preoccupied.    She has never been on PD medication or been to PT until yesterday.  03/16/13 update:  The pt presents today with her husband.  She has not been seen since February, 2014 at which time I dx her with PD.  She was started on levodopa 25/100 three times per day.  She did not follow up sooner as she had bronchitis and pneumonia for the last 2 months and is just now recovering.  She did go to PT and liked it but would like to go back.  She is doing well on the levodopa.  Her husband rarely notice tremor any longer.  06/28/13 update:    The pt today presents for f/u with her husband who supplements the hx.  She is on carbidopa/levodopa 25/100 three times per day.  She is doing well but notices R arm tremor now that had gone away.  Rarely she will note tremor in the L hand which she had not noted previously.  She does have some cramping of the feet.  She is unsure if this is associated with wearing off.  She did have some  of this while in my office, and it was time for her to redose the carbidopa/levodopa.  She is now attending therapy, and is enjoying and finding benefit from it.  08/08/12 update:  The patient presents today with her husband, who supplements the history.  The patient has Parkinson's disease and is currently on carbidopa/levodopa 25/100, one tablet 3 times per day.  Last visit, she was describing some cramping in her feet and I was trying to figure out whether or not this was an "off" phenomenon.  After paying attention, the patient called and said that she thought it was and we added entacapone.  The cramping in the feet is better but she is now having a stabbing and shooting pain down the back of the leg and to the lateral side of the lower leg that stops at the ankle.  It has stopped her from exercising.  She sometimes has back pain.  This is fairly inconsistent.  The leg pain seems to start about 4 days after she finished rehabilitation, which did seem to help.  She is not sure if the pain is related to the rehabilitation, however.  10/03/13 update:  The patient presents today with her husband, who supplements the history.  The patient is currently on carbidopa/levodopa 25/100, one  tablet 3 times per day.  She is also on Comtan 200 mg, one tablet 3 times per day.  From a Parkinson's standpoint, she has been doing well.  No falls.  No hallucinations.  Her husband rarely sees tremor, and that is primarily when she is in bed at night and he will feel it rather than see it.  Last visit, she was complaining of back pain that was likely sciatica.  I ordered an EMG but they ultimately canceled that.  I tried her on Lyrica but that caused her to be excessively sleepy.  She went to the chiropractor and states that she is doing much better from that regard.  Unfortunately, she has had bronchitis and a urinary tract infection.  She has been on several antibiotics, which have caused her to be nauseated.  She has also had  dental work and had several of her teeth pulled.  Because of these things, she has had a decreased appetite and really has not been able to eat.  She had a temporary partial placed, but her appetite still hasn't been what it used to be, primarily because of the nausea from the antibiotic.  She has not been exercising because of being sick.  She is hoping to get back to that soon.  02/02/14 update:  The patient presents today with her husband, who supplements the history.  The patient is currently on carbidopa/levodopa 25/100, 1 tablet at 9 AM/1 PM and 2 tablets at 6 PM.   She is also on Comtan 200 mg, one tablet 3 times per day.   The discomfort in the foot is much better.  From a Parkinson's standpoint, she has been doing well.  No falls.  No hallucinations.  The patient was screened through our Parkinson's screening program on 01/16/2014 and was either at baseline or better than baseline at all therapies and is scheduled for re-screening in January.  She is not currently exercising.    06/05/14 update:  The patient presents today with her husband, who supplements the history.  The patient is currently on carbidopa/levodopa 25/100, 1 tablet at 9 AM/1 PM and 2 tablets at 6 PM.   She is also on Comtan 200 mg, one tablet 3 times per day.  No falls.  No lightheadedness or near syncope.  No hallucinations.   I reviewed records since last visit.  Weight loss has been a concern.  The dietician has talked with her about suggestions to help, and the patient states that she was told to take Carnation 3 times per day.  Pt states that the biggest issue that has caused weight loss has been persistent nausea, which has been going on for about 2 months.  They really do not think that it is related to the Parkinson's medications, because it did not start with changes in Parkinson's medications.  It actually seemed to start after a visit to a restaurant and then persisted.  She just feels nauseated, has lost taste for food and  has lost appetite.  She followed up with Dr. Kenton Kingfisher and was started on Zantac about 3 or 4 days ago and does feel better and her blood pressure medication was discontinued.  10/04/14 update:  The patient presents today with her husband, who supplements the history.  The patient is currently on carbidopa/levodopa 25/100, 1 tablet at 9 AM/1 PM and 2 tablets at 6 PM.   She is also on Comtan 200 mg, one tablet 3 times per day. We d/c her comtan  for a week to see if her GI upset changed at all and it did not so we restarted it since obviously that was not the etiology.  She continues to have GI upset in the AM.  She went to her PCP and she was given Zofran and states that it made it worse but she only took it once.  It is only the AM when she has nausea and she does better the rest of the day.    12/14/14 update:  The patient returns today, accompanied by her husband who supplements the history.  Records were reviewed since last visit.  She has a history of breast cancer and was having weight loss/anorexia and saw her oncologist.  She ended up having a CT chest, abdomen and pelvis and those were unremarkable for any new lesions.  She has lost about 13 pounds since March of last year.  She has been complaining about nausea the last several visits.  In fact, we've previously stopped her entacapone to see if that would help the nausea, but it did not.  She did not think that it was related at all to her levodopa.  She no longer has the nausea but in the AM she doesn't feel good.  She has difficulty describing what this means.  She remains on carbidopa/levodopa 25/100, 1 tablet at 9 AM/one tablet at 1 PM and 2 tablets at 6 PM.  She is still on entacapone, 200 mg 3 times a day.  They brought with them a list of blood pressures and while the SBP is generally high (170's-190's without med and 150's-180's with BP med) it was never over 200.  She states that she was off the BP med a few days and felt better but when she went  back on it she felt worse again.  She was told to go back on it because it her BP's were too high.  She is only on amlodipine 2.5 mg.    03/05/15 update:  The patient is following up today regarding her Parkinson's disease.  She is currently on carbidopa/levodopa 25/100, 1 tablet at 9 AM, 1 tablet at 1 PM and 2 tablets at 6 PM.  She takes entacapone with each of these dosages.  At last visit, she was very focused on her blood pressure and I asked her to follow-up with her primary care physician to see if she actually needed her blood pressure medication.  Today, the patient and her husband state that she was able to d/c her amlodipine and her BP has been well controlled. She states that the nausea is better.  She states that she gets up in the AM and takes her med and just feels jittery and anxious.  Her husband asks about trying to take a levodopa at bedtime to see if that helps.  When she takes her xanax, the anxiety does seem to get better.  They were thinking about asking PCP to see if she can go up on the medication.  Husband admits that balance sometimes off and cognition not great.  PT did not think that she needed walker per their hx.  She has gotten back to her exercise.   By 3-4 pm, pt feels well.    06/11/15 update:  The patient presents today for follow-up, accompanied by her husband who supplements the history.  She is on carbidopa/levodopa 25/100, 1 tablet at 9 AM, 1 tablet at 1 PM and 2 tablets at 6 PM.  She is on entacapone  200 mg 3 times a day and last visit we added carbidopa/levodopa 50/200 at night.  She didn't notice a big difference with that. Her husband isn't sure that she really gave it enough time to know.   Her biggest issue last visit was her anxiety and I told her to try an extra levodopa when she is anxious to see if that helps.  They tried that and it didn't help.    I did tell her that I did not want her taking daily Xanax.  Today, the patient states that the "only thing that helps  is xanax."   Unfortunately, she has not been exercising.  Her husband is undergoing tx daily for prostate CA and they are consumed with that.  Balance has been okay and her husband states that she is doing well in that regard.  No falls.  She is sleeping from 9pm until 5-6am. She is taking naps during the day.   09/11/15 update:  The patient follows up today, accompanied by her husband who supplements the history.  She is on carbidopa/levodopa 25/100, 1 tablet at 9 AM, 1 at 1 PM and 2 tablets at 6 PM.  I asked her to retry carbidopa/levodopa 50/200 at night last visit.  She is still on the entacapone 200 mg 3 times per day.  Last visit, I encouraged her to discontinue her daily Xanax and we started her on Wellbutrin XL for her anxiety.  Her husband thinks that she is still on it (and off of xanax) but wellbutrin is not on her medication list today.  Anxiety has been better per both patient and husband.  Husband states that Dr. Kenton Kingfisher told them to ask me about sertraline.  She was going to come in and talk to me on January 11, but ended up having increased hallucinations and was admitted to the hospital from January 11 to 08/11/2015.  She was treated for urinary tract infection although her urine culture ended up being negative.  Husband reports that she is 90-95% better from a mental standpoint.  No hallucinations since d/c from the hospital.  Doing home PT/OT/ST and nurse comes once per week.    01/09/16 update:  The patient follows up today, accompanied by her husband who supplements the history.  She is on carbidopa/levodopa 25/100, 1 tablet at 9 AM, 1 at 1 PM and 2 tablets at 6 PM.  For some reason, her carbidopa/levodopa 50/200 got accidentally stopped, and I told them to just go ahead and hold that for now.  She is still on the entacapone 200 mg 3 times per day.  Last visit, her husband was not sure if she was still on the Wellbutrin and ended up calling me back.  This was stopped during her hospitalization.   She, therefore, started on Zoloft instead right after our last visit.  She took a single 25 mg tablet and felt nauseated, but I reminded her that she feels nauseated a lot in the morning and asked her to retry it, telling her that she could take it before bedtime and then work up to 50 mg at night.  She is only on 25 mg and husband states that she is doing well and anxiety is better.  Only time an issue is that she hates to get in a car and travel.    Nausea is also better.    05/13/16 update:  The patient follows up today, accompanied by her husband who supplements the history.  She is on  carbidopa/levodopa 25/100, 1 tablet at 9 AM, 1 at 1 PM and 2 tablets at 6 PM.  She is on entacapone 200 mg 3 times a day with each of these dosages.  For the anxiety and depression, she is on Zoloft which has been increased to 50 mg daily.  That has helped.  She was hospitalized from 9/18-9/22 for CAP.  The records that were made available to me were reviewed. Having a facet joint injection tomorrow by Dr. Jacelyn Grip for back pain.  Not been able to get back to exercise because of pneumonia and back pain.  Has PD screen on 11/28.  08/19/16 update:  The patient follows up today, accompanied by her husband who supplements the history.  She is on carbidopa/levodopa 25/100, 1 tablet at 9 AM, 1 at 1 PM and 2 tablets at 6 PM.  She is on entacapone 200 mg 3 times a day with each of these dosages.  For the anxiety and depression, she is on Zoloft which has been increased to 50 mg daily.  Husband states that "she is not as happy as the gal I married."  The records that were made available to me were reviewed.  Pt denies falls.  Pt denies lightheadedness, near syncope.  No hallucinations.  Husband states that yesterday was a really bad day both physically and mentally (more confused and had help getting dressed).  Is much better today.  Appetite is overall down.  Drinking plenty of water.  Not exercising.  Is sleeping a lot per husband.  Husband states that was having back pain.  Went and had rhizotomy and that helped and then PT ordered but husband wants it at neurorehab center as patient very unsteady.  11/24/16 update: Patient seen today, accompanied by her husband to supplements the history.  Patient remains on carbidopa/levodopa 25/100, one tablet at 1 tablet at 9am, 1 PM and 2 tablets at 6 PM.  She takes entacapone 200 mg 3 times per day.  She is on sertraline, 50 mg daily.  She still suffers from rather significant anxiety.  She had one fall but they aren't sure if since last visit but thinks it was.  No hallucinations.  She has been attending outpatient Parkinson's therapies and I have reviewed those records.    04/06/17 update:  Pt seen in f/u for parkinsonism.  This patient is accompanied in the office by her spouse who supplements the history.  On carbidopa/levodopa 25/100, 1/1/2 and comtan 200 mg with each dosage.  Still on zoloft, 50 mg for anxiety, which continues to be problematic.  The records that were made available to me were reviewed.  Completed therapies since our last visit.  Also hospitalized in July for hematochezia with associated significant anemia, requiring transfusion.  EGD was negative.  Bleed was suspected lower GI in nature, likely from diverticular disease.  No falls.  Little off balance.  No hallucinations.  Memory is getting worse.  Some trouble at times knowing how to get in and out of bed.  08/18/17 update: Patient seen today in follow-up for parkinsonism and dementia.  She is accompanied by her husband who supplements history.  She is on carbidopa/levodopa 25/100, 1/1/2 and entacapone with each dosage.  Last visit, we started donepezil.  She has tolerated that medication well.  No diarrhea. She c/o "forgetting a lot."   Remains on sertraline for anxiety.  Her husband called since last visit and stated that occasionally the patient feels that she is choking when she eats.  Modified barium swallow was completed.   It was essentially unremarkable and regular diet with thin liquids was recommended.  Reviewed records since last visit.  She saw oncology.  She remains in remission from her non-Hodgkin's lymphoma and breast cancer.  She is still having back pain and called dr. Jacelyn Grip and states that she is to have "another procedure" to help her back.  Went to [facility] urology and going to start bladder stim.  Asks me about this.  Some trouble sleeping - "I don't want a pill."  01/18/18 update: Patient is seen today in follow-up for Parkinson's disease with associated Parkinson's disease dementia.  She is accompanied by her husband who supplements history.  She is on carbidopa/levodopa 25/100, 1 tablet in the morning, 1 tablet in the afternoon and 2 in the evening.  She takes and compliant with each of these.  Pt denies falls.  Pt denies lightheadedness, near syncope.  No hallucinations.  Mood has been good.  She has seen Dr. Benay Spice since our last visit.  Those records are reviewed.  She remains in remission from her breast cancer and non-Hodgkin's lymphoma.  She is seeing Dr. Ellene Route for her back pain and isn't exercising because of that.  She had an injection for her back pain and is scheduled for another on 02/01/18.  Husband states that he thinks that she is giving up and refusing to do everything.    06/14/18 update: Patient seen today in follow-up for Parkinson's disease with Parkinson's disease dementia, accompanied by her husband who supplements the history.  I have reviewed neurosurgery records that have been made available to me.  She has been seeing them for low back pain.  She is on carbidopa/levodopa 25/100, 1 tablet in the morning, 1 in the afternoon and 2 in the evening.  She is on entacapone with each of these dosages.  She has had no falls since our last visit.  No lightheadedness or near syncope.  She was seen in the emergency room at the end of August with visual hallucinations.  Work-up was negative.  She had  recently been prescribed tramadol for low back pain and it was felt that this medication was likely culprit.  It was recommended that this medication be discontinued.  ***No longer having hallucinations.  Is on Aricept.  Neuroimaging has  previously been performed.  An MRI brain was last done in 2010 but pt can no longer have MRI's because of an implantable hearing device.    PREVIOUS MEDICATIONS: none to date  ALLERGIES:  No Known Allergies  CURRENT MEDICATIONS:  Current Outpatient Medications on File Prior to Visit  Medication Sig Dispense Refill  . acetaminophen (TYLENOL) 325 MG tablet Take 650 mg by mouth every 6 (six) hours as needed for mild pain, moderate pain, fever or headache.     [name] acyclovir (ZOVIRAX) 400 MG tablet TAKE 1 TABLET BY MOUTH TWICE DAILY 120 tablet 3  . Calcium Carbonate-Vitamin D3 (CALCIUM 600-D) 600-400 MG-UNIT TABS Take 1 tablet by mouth at bedtime.    . carbidopa-levodopa (SINEMET IR) 25-100 MG tablet TAKE 1 TABLET BY MOUTH DAILY IN THE MORNING, 1 IN THE AFTERNOON, AND 2 TABLETS IN THE EVENING 360 tablet 1  . cetirizine (ZYRTEC) 10 MG tablet Take 10 mg by mouth daily at 12 noon.     . cholecalciferol (VITAMIN D) 1000 units tablet Take 1,000 Units by mouth every evening.     . clobetasol (TEMOVATE) 0.05 % external solution Apply 1 application topically daily.     [name]  donepezil (ARICEPT) 10 MG tablet TAKE 1 TABLET BY MOUTH DAILY 90 tablet 1  . entacapone (COMTAN) 200 MG tablet TAKE 1 TABLET BY MOUTH 3 TIMES DAILY WITH SINEMET 270 tablet 1  . Fluticasone-Salmeterol (ADVAIR) 250-50 MCG/DOSE AEPB Inhale 1 puff into the lungs 2 (two) times daily.    [name] ketoconazole (NIZORAL) 2 % shampoo Apply 1 application topically 2 (two) times a week.     . mirabegron ER (MYRBETRIQ) 50 MG TB24 tablet Take 50 mg by mouth daily at 12 noon.     . Multiple Vitamins-Minerals (MULTIVITAMIN WITH MINERALS) tablet Take 1 tablet by mouth daily with breakfast.     . ondansetron (ZOFRAN) 8 MG tablet  Take 8 mg by mouth at bedtime as needed for nausea or vomiting.     . pantoprazole (PROTONIX) 40 MG tablet Take 40 mg by mouth daily.     . sertraline (ZOLOFT) 100 MG tablet Take 1 tablet (100 mg total) by mouth daily. 90 tablet 1   No current facility-administered medications on file prior to visit.     PAST MEDICAL HISTORY:   Past Medical History:  Diagnosis Date  . Anxiety   . Asthma   . Depression   . Diabetes mellitus without complication ([place])    2/87/86.Marland KitchenMarland Kitchenpt denies  . GERD (gastroesophageal reflux disease)   . Hearing loss   . NHL (non-Hodgkin's lymphoma) ([place])    nhl dx 9/04 breast ca dx1/12  . Parkinson disease ([place])     PAST SURGICAL HISTORY:   Past Surgical History:  Procedure Laterality Date  . Ba-HA Ear implant    . ESOPHAGOGASTRODUODENOSCOPY N/A 02/04/2017   Procedure: ESOPHAGOGASTRODUODENOSCOPY (EGD);  Surgeon: Arta Silence, MD;  Location: [facility];  Service: Endoscopy;  Laterality: N/A;  . MASTECTOMY  2 /8/ 12   bilateral  . MASTECTOMY      SOCIAL HISTORY:   Social History   Socioeconomic History  . Marital status: Married    Spouse name: Not on file  . Number of children: Not on file  . Years of education: Not on file  . Highest education level: Not on file  Occupational History  . Occupation: retired    Fish farm manager: RETIRED    Comment: [employer]  Social Needs  . Financial resource strain: Not on file  . Food insecurity:    Worry: Not on file    Inability: Not on file  . Transportation needs:    Medical: Not on file    Non-medical: Not on file  Tobacco Use  . Smoking status: Never Smoker  . Smokeless tobacco: Never Used  . Tobacco comment: husband wsa smoker  Substance and Sexual Activity  . Alcohol use: No    Comment: no  . Drug use: No  . Sexual activity: Not on file  Lifestyle  . Physical activity:    Days per week: Not on file    Minutes per session: Not on file  . Stress: Not on file  Relationships  . Social connections:     Talks on phone: Not on file    Gets together: Not on file    Attends religious service: Not on file    Active member of club or organization: Not on file    Attends meetings of clubs or organizations: Not on file    Relationship status: Not on file  . Intimate partner violence:    Fear of current or ex partner: Not on file    Emotionally abused: Not on file  Physically abused: Not on file    Forced sexual activity: Not on file  Other Topics Concern  . Not on file  Social History Narrative  . Not on file    FAMILY HISTORY:   Family Status  Relation Name Status  . Father  Deceased       CA, lung  . Mother  Deceased       CA; complications of splenectomy  . Brother  Deceased       3, renal failure, DM-2; CAD; CA  . Child  Alive       5 (4 biological), one with PKU    ROS:  ROS'  PHYSICAL EXAMINATION:    VITALS:   There were no vitals filed for this visit. Wt Readings from Last 3 Encounters:  02/28/18 118 lb 8 oz (53.8 kg)  02/11/18 119 lb (54 kg)  01/18/18 115 lb 6 oz (52.3 kg)     GEN:  The patient appears stated age.  She is very anxious today CV:  RRR with 3/6 SEM Lungs:  CTAB. She has several fast breaths when anxious Neck:  No bruits but cardiac murmur radiates to the bilateral carotids   Neurological examination:  Orientation: She is alert to person and place [facility] Cognitive Assessment  05/13/2016 09/11/2015 12/14/2014  Visuospatial/ Executive (0/5) 3 3 3   Naming (0/3) 2 3 3   Attention: Read list of digits (0/2) 2 1 2   Attention: Read list of letters (0/1) 1 1 1   Attention: Serial 7 subtraction starting at 100 (0/3) 0 0 0  Language: Repeat phrase (0/2) 1 0 1  Language : Fluency (0/1) 1 1 1   Abstraction (0/2) 1 1 1   Delayed Recall (0/5) 0 0 0  Orientation (0/6) 5 6 6   Total 16 16 18   Adjusted Score (based on education) 17 17 19    Cranial nerves: There is good facial symmetry.  The visual fields are full to confrontational testing. The speech  is fluent and clear.  She is hypophonic.  Soft palate rises symmetrically and there is no tongue deviation. Hearing is markedly decreased today even with hearing aid Sensation: Sensation is intact to light outh throughout. Motor: Strength is 5/5 in the bilateral upper and lower extremities.   Shoulder shrug is equal and symmetric.  There is no pronator drift.   Movement examination: Tone: There is no rigidity today Abnormal movements: There is no tremor Coordination:  There is no decremation noted today Gait and Station: The patient pushes off of the chair.  She has a cane but walks well with it with good stride length  Labs:  Lab Results  Component Value Date   WBC 4.3 03/21/2018   HGB 9.8 (L) 03/21/2018   HCT 28.9 (L) 03/21/2018   MCV 88.7 03/21/2018   PLT 192 03/21/2018     Chemistry      Component Value Date/Time   NA 138 03/21/2018 1710   NA 143 04/13/2017 1155   K 3.9 03/21/2018 1710   K 4.1 04/13/2017 1155   CL 101 03/21/2018 1710   CL 106 12/26/2012 1110   CO2 28 03/21/2018 1710   CO2 27 04/13/2017 1155   BUN 21 03/21/2018 1710   BUN 19.5 04/13/2017 1155   CREATININE 1.01 (H) 03/21/2018 1710   CREATININE 1.18 (H) 01/03/2018 1107   CREATININE 1.2 (H) 04/13/2017 1155      Component Value Date/Time   CALCIUM 8.9 03/21/2018 1710   CALCIUM 9.6 04/13/2017 1155   ALKPHOS  83 03/21/2018 1710   ALKPHOS 97 04/13/2017 1155   AST 23 03/21/2018 1710   AST 20 01/03/2018 1107   AST 20 04/13/2017 1155   ALT 8 03/21/2018 1710   ALT <6 01/03/2018 1107   ALT 7 04/13/2017 1155   BILITOT 0.5 03/21/2018 1710   BILITOT 0.4 01/03/2018 1107   BILITOT 0.31 04/13/2017 1155      Lab Results  Component Value Date   VITAMINB12 1053 (H) 06/10/2006   Lab Results  Component Value Date   TSH 2.93 09/19/2012    ASSESSMENT/PLAN:  1.  Idiopathic Parkinson's disease, dx 08/2012.    -We discussed the diagnosis as well as pathophysiology of the disease.  We discussed treatment  options as well as prognostic indicators.  Patient education was provided.  - continue carbidopa/levodopa 25/100, 1/1/2. She needs to avoid protein when taking levodopa and discussed this.  Risks, benefits, side effects and alternative therapies were discussed.  The opportunity to ask questions was given and they were answered to the best of my ability.  The patient expressed understanding and willingness to follow the outlined treatment protocols.  -Pt is on entacapone tid and she will continue on this.   2.  Anxiety  -she is still on sertraline 50 mg daily.    -anxiety has always been a huge driving factor and was very prominent today.   3.  Cardiac murmur  -being followed yearly by cardiology for mod AS 4.  PDD  -continue aricept, 10 mg daily.  Risks, benefits, side effects and alternative therapies were discussed.  The opportunity to ask questions was given and they were answered to the best of my ability.  The patient expressed understanding and willingness to follow the outlined treatment protocols.  - Is free of hallucinations but if occur again, will consider nuplazid.   -long discussion about "writing her life story" and doing things that promote memory building 5. Urinary incontinence  -seeing [facility] for neurostim.  Reassured her that this is okay with PD.  Pt asks about this again today 6.  low back pain  -seeing Dr. Ellene Route.  Having injections. 7.  ***

## 2018-06-14 ENCOUNTER — Ambulatory Visit: Payer: Medicare Other | Admitting: Neurology

## 2018-06-14 ENCOUNTER — Encounter

## 2018-06-14 NOTE — Progress Notes (Signed)
[name] was seen today in the movement disorders clinic for neurologic consultation at the request of Dr. Betsy Coder.    The consultation is for the evaluation of ataxia and tremor.  The patient has been a previous pt of Dr. Erling Cruz.  Dr. Erling Cruz dx the patient with parkinsonism and ET.  This is an 84 y/o female with a complex medical hx of both non Hodgkins lymphoma require chemotherapy and breast CA, s/p bilateral mastectomy.  She has a long hx of tremor.  I reviewed Dr. Bernardo Heater notes that were made available to me.  It appears that she presented with right hand resting tremor in 2009, without associated rigidity or bradykinesia.  There were no known medications causing the problem and it appears that she was initially dx with ET and later transitioned the dx to parkinsonism.  The patient reports no tremor in the L hand.  Her husband reports a little tremor in the R leg.  The patient reports that tremor is most significant with relaxing and she has no tremor if her hands are preoccupied.    She has never been on PD medication or been to PT until yesterday.  03/16/13 update:  The pt presents today with her husband.  She has not been seen since February, 2014 at which time I dx her with PD.  She was started on levodopa 25/100 three times per day.  She did not follow up sooner as she had bronchitis and pneumonia for the last 2 months and is just now recovering.  She did go to PT and liked it but would like to go back.  She is doing well on the levodopa.  Her husband rarely notice tremor any longer.  06/28/13 update:    The pt today presents for f/u with her husband who supplements the hx.  She is on carbidopa/levodopa 25/100 three times per day.  She is doing well but notices R arm tremor now that had gone away.  Rarely she will note tremor in the L hand which she had not noted previously.  She does have some cramping of the feet.  She is unsure if this is associated with wearing off.  She did have some  of this while in my office, and it was time for her to redose the carbidopa/levodopa.  She is now attending therapy, and is enjoying and finding benefit from it.  08/08/12 update:  The patient presents today with her husband, who supplements the history.  The patient has Parkinson's disease and is currently on carbidopa/levodopa 25/100, one tablet 3 times per day.  Last visit, she was describing some cramping in her feet and I was trying to figure out whether or not this was an "off" phenomenon.  After paying attention, the patient called and said that she thought it was and we added entacapone.  The cramping in the feet is better but she is now having a stabbing and shooting pain down the back of the leg and to the lateral side of the lower leg that stops at the ankle.  It has stopped her from exercising.  She sometimes has back pain.  This is fairly inconsistent.  The leg pain seems to start about 4 days after she finished rehabilitation, which did seem to help.  She is not sure if the pain is related to the rehabilitation, however.  10/03/13 update:  The patient presents today with her husband, who supplements the history.  The patient is currently on carbidopa/levodopa 25/100, one  tablet 3 times per day.  She is also on Comtan 200 mg, one tablet 3 times per day.  From a Parkinson's standpoint, she has been doing well.  No falls.  No hallucinations.  Her husband rarely sees tremor, and that is primarily when she is in bed at night and he will feel it rather than see it.  Last visit, she was complaining of back pain that was likely sciatica.  I ordered an EMG but they ultimately canceled that.  I tried her on Lyrica but that caused her to be excessively sleepy.  She went to the chiropractor and states that she is doing much better from that regard.  Unfortunately, she has had bronchitis and a urinary tract infection.  She has been on several antibiotics, which have caused her to be nauseated.  She has also had  dental work and had several of her teeth pulled.  Because of these things, she has had a decreased appetite and really has not been able to eat.  She had a temporary partial placed, but her appetite still hasn't been what it used to be, primarily because of the nausea from the antibiotic.  She has not been exercising because of being sick.  She is hoping to get back to that soon.  02/02/14 update:  The patient presents today with her husband, who supplements the history.  The patient is currently on carbidopa/levodopa 25/100, 1 tablet at 9 AM/1 PM and 2 tablets at 6 PM.   She is also on Comtan 200 mg, one tablet 3 times per day.   The discomfort in the foot is much better.  From a Parkinson's standpoint, she has been doing well.  No falls.  No hallucinations.  The patient was screened through our Parkinson's screening program on 01/16/2014 and was either at baseline or better than baseline at all therapies and is scheduled for re-screening in January.  She is not currently exercising.    06/05/14 update:  The patient presents today with her husband, who supplements the history.  The patient is currently on carbidopa/levodopa 25/100, 1 tablet at 9 AM/1 PM and 2 tablets at 6 PM.   She is also on Comtan 200 mg, one tablet 3 times per day.  No falls.  No lightheadedness or near syncope.  No hallucinations.   I reviewed records since last visit.  Weight loss has been a concern.  The dietician has talked with her about suggestions to help, and the patient states that she was told to take Carnation 3 times per day.  Pt states that the biggest issue that has caused weight loss has been persistent nausea, which has been going on for about 2 months.  They really do not think that it is related to the Parkinson's medications, because it did not start with changes in Parkinson's medications.  It actually seemed to start after a visit to a restaurant and then persisted.  She just feels nauseated, has lost taste for food and  has lost appetite.  She followed up with Dr. Kenton Kingfisher and was started on Zantac about 3 or 4 days ago and does feel better and her blood pressure medication was discontinued.  10/04/14 update:  The patient presents today with her husband, who supplements the history.  The patient is currently on carbidopa/levodopa 25/100, 1 tablet at 9 AM/1 PM and 2 tablets at 6 PM.   She is also on Comtan 200 mg, one tablet 3 times per day. We d/c her comtan  for a week to see if her GI upset changed at all and it did not so we restarted it since obviously that was not the etiology.  She continues to have GI upset in the AM.  She went to her PCP and she was given Zofran and states that it made it worse but she only took it once.  It is only the AM when she has nausea and she does better the rest of the day.    12/14/14 update:  The patient returns today, accompanied by her husband who supplements the history.  Records were reviewed since last visit.  She has a history of breast cancer and was having weight loss/anorexia and saw her oncologist.  She ended up having a CT chest, abdomen and pelvis and those were unremarkable for any new lesions.  She has lost about 13 pounds since March of last year.  She has been complaining about nausea the last several visits.  In fact, we've previously stopped her entacapone to see if that would help the nausea, but it did not.  She did not think that it was related at all to her levodopa.  She no longer has the nausea but in the AM she doesn't feel good.  She has difficulty describing what this means.  She remains on carbidopa/levodopa 25/100, 1 tablet at 9 AM/one tablet at 1 PM and 2 tablets at 6 PM.  She is still on entacapone, 200 mg 3 times a day.  They brought with them a list of blood pressures and while the SBP is generally high (170's-190's without med and 150's-180's with BP med) it was never over 200.  She states that she was off the BP med a few days and felt better but when she went  back on it she felt worse again.  She was told to go back on it because it her BP's were too high.  She is only on amlodipine 2.5 mg.    03/05/15 update:  The patient is following up today regarding her Parkinson's disease.  She is currently on carbidopa/levodopa 25/100, 1 tablet at 9 AM, 1 tablet at 1 PM and 2 tablets at 6 PM.  She takes entacapone with each of these dosages.  At last visit, she was very focused on her blood pressure and I asked her to follow-up with her primary care physician to see if she actually needed her blood pressure medication.  Today, the patient and her husband state that she was able to d/c her amlodipine and her BP has been well controlled. She states that the nausea is better.  She states that she gets up in the AM and takes her med and just feels jittery and anxious.  Her husband asks about trying to take a levodopa at bedtime to see if that helps.  When she takes her xanax, the anxiety does seem to get better.  They were thinking about asking PCP to see if she can go up on the medication.  Husband admits that balance sometimes off and cognition not great.  PT did not think that she needed walker per their hx.  She has gotten back to her exercise.   By 3-4 pm, pt feels well.    06/11/15 update:  The patient presents today for follow-up, accompanied by her husband who supplements the history.  She is on carbidopa/levodopa 25/100, 1 tablet at 9 AM, 1 tablet at 1 PM and 2 tablets at 6 PM.  She is on entacapone  200 mg 3 times a day and last visit we added carbidopa/levodopa 50/200 at night.  She didn't notice a big difference with that. Her husband isn't sure that she really gave it enough time to know.   Her biggest issue last visit was her anxiety and I told her to try an extra levodopa when she is anxious to see if that helps.  They tried that and it didn't help.    I did tell her that I did not want her taking daily Xanax.  Today, the patient states that the "only thing that helps  is xanax."   Unfortunately, she has not been exercising.  Her husband is undergoing tx daily for prostate CA and they are consumed with that.  Balance has been okay and her husband states that she is doing well in that regard.  No falls.  She is sleeping from 9pm until 5-6am. She is taking naps during the day.   09/11/15 update:  The patient follows up today, accompanied by her husband who supplements the history.  She is on carbidopa/levodopa 25/100, 1 tablet at 9 AM, 1 at 1 PM and 2 tablets at 6 PM.  I asked her to retry carbidopa/levodopa 50/200 at night last visit.  She is still on the entacapone 200 mg 3 times per day.  Last visit, I encouraged her to discontinue her daily Xanax and we started her on Wellbutrin XL for her anxiety.  Her husband thinks that she is still on it (and off of xanax) but wellbutrin is not on her medication list today.  Anxiety has been better per both patient and husband.  Husband states that Dr. Kenton Kingfisher told them to ask me about sertraline.  She was going to come in and talk to me on January 11, but ended up having increased hallucinations and was admitted to the hospital from January 11 to 08/11/2015.  She was treated for urinary tract infection although her urine culture ended up being negative.  Husband reports that she is 90-95% better from a mental standpoint.  No hallucinations since d/c from the hospital.  Doing home PT/OT/ST and nurse comes once per week.    01/09/16 update:  The patient follows up today, accompanied by her husband who supplements the history.  She is on carbidopa/levodopa 25/100, 1 tablet at 9 AM, 1 at 1 PM and 2 tablets at 6 PM.  For some reason, her carbidopa/levodopa 50/200 got accidentally stopped, and I told them to just go ahead and hold that for now.  She is still on the entacapone 200 mg 3 times per day.  Last visit, her husband was not sure if she was still on the Wellbutrin and ended up calling me back.  This was stopped during her hospitalization.   She, therefore, started on Zoloft instead right after our last visit.  She took a single 25 mg tablet and felt nauseated, but I reminded her that she feels nauseated a lot in the morning and asked her to retry it, telling her that she could take it before bedtime and then work up to 50 mg at night.  She is only on 25 mg and husband states that she is doing well and anxiety is better.  Only time an issue is that she hates to get in a car and travel.    Nausea is also better.    05/13/16 update:  The patient follows up today, accompanied by her husband who supplements the history.  She is on  carbidopa/levodopa 25/100, 1 tablet at 9 AM, 1 at 1 PM and 2 tablets at 6 PM.  She is on entacapone 200 mg 3 times a day with each of these dosages.  For the anxiety and depression, she is on Zoloft which has been increased to 50 mg daily.  That has helped.  She was hospitalized from 9/18-9/22 for CAP.  The records that were made available to me were reviewed. Having a facet joint injection tomorrow by Dr. Jacelyn Grip for back pain.  Not been able to get back to exercise because of pneumonia and back pain.  Has PD screen on 11/28.  08/19/16 update:  The patient follows up today, accompanied by her husband who supplements the history.  She is on carbidopa/levodopa 25/100, 1 tablet at 9 AM, 1 at 1 PM and 2 tablets at 6 PM.  She is on entacapone 200 mg 3 times a day with each of these dosages.  For the anxiety and depression, she is on Zoloft which has been increased to 50 mg daily.  Husband states that "she is not as happy as the gal I married."  The records that were made available to me were reviewed.  Pt denies falls.  Pt denies lightheadedness, near syncope.  No hallucinations.  Husband states that yesterday was a really bad day both physically and mentally (more confused and had help getting dressed).  Is much better today.  Appetite is overall down.  Drinking plenty of water.  Not exercising.  Is sleeping a lot per husband.  Husband states that was having back pain.  Went and had rhizotomy and that helped and then PT ordered but husband wants it at neurorehab center as patient very unsteady.  11/24/16 update: Patient seen today, accompanied by her husband to supplements the history.  Patient remains on carbidopa/levodopa 25/100, one tablet at 1 tablet at 9am, 1 PM and 2 tablets at 6 PM.  She takes entacapone 200 mg 3 times per day.  She is on sertraline, 50 mg daily.  She still suffers from rather significant anxiety.  She had one fall but they aren't sure if since last visit but thinks it was.  No hallucinations.  She has been attending outpatient Parkinson's therapies and I have reviewed those records.    04/06/17 update:  Pt seen in f/u for parkinsonism.  This patient is accompanied in the office by her spouse who supplements the history.  On carbidopa/levodopa 25/100, 1/1/2 and comtan 200 mg with each dosage.  Still on zoloft, 50 mg for anxiety, which continues to be problematic.  The records that were made available to me were reviewed.  Completed therapies since our last visit.  Also hospitalized in July for hematochezia with associated significant anemia, requiring transfusion.  EGD was negative.  Bleed was suspected lower GI in nature, likely from diverticular disease.  No falls.  Little off balance.  No hallucinations.  Memory is getting worse.  Some trouble at times knowing how to get in and out of bed.  08/18/17 update: Patient seen today in follow-up for parkinsonism and dementia.  She is accompanied by her husband who supplements history.  She is on carbidopa/levodopa 25/100, 1/1/2 and entacapone with each dosage.  Last visit, we started donepezil.  She has tolerated that medication well.  No diarrhea. She c/o "forgetting a lot."   Remains on sertraline for anxiety.  Her husband called since last visit and stated that occasionally the patient feels that she is choking when she eats.  Modified barium swallow was completed.   It was essentially unremarkable and regular diet with thin liquids was recommended.  Reviewed records since last visit.  She saw oncology.  She remains in remission from her non-Hodgkin's lymphoma and breast cancer.  She is still having back pain and called dr. Jacelyn Grip and states that she is to have "another procedure" to help her back.  Went to [facility] urology and going to start bladder stim.  Asks me about this.  Some trouble sleeping - "I don't want a pill."  01/18/18 update: Patient is seen today in follow-up for Parkinson's disease with associated Parkinson's disease dementia.  She is accompanied by her husband who supplements history.  She is on carbidopa/levodopa 25/100, 1 tablet in the morning, 1 tablet in the afternoon and 2 in the evening.  She takes and compliant with each of these.  Pt denies falls.  Pt denies lightheadedness, near syncope.  No hallucinations.  Mood has been good.  She has seen Dr. Benay Spice since our last visit.  Those records are reviewed.  She remains in remission from her breast cancer and non-Hodgkin's lymphoma.  She is seeing Dr. Ellene Route for her back pain and isn't exercising because of that.  She had an injection for her back pain and is scheduled for another on 02/01/18.  Husband states that he thinks that she is giving up and refusing to do everything.    06/16/18 update: Patient seen today in follow-up for Parkinson's disease with Parkinson's disease dementia, accompanied by her husband who supplements the history.  I have reviewed neurosurgery records that have been made available to me.  She has been seeing them for low back pain.  She is on carbidopa/levodopa 25/100, 1 tablet in the morning, 1 in the afternoon and 2 in the evening.  She is on entacapone with each of these dosages.  She has had no falls since our last visit.  No lightheadedness or near syncope.  She was seen in the emergency room at the end of August with visual hallucinations.  Work-up was negative.  She had  recently been prescribed tramadol for low back pain and it was felt that this medication was likely culprit.  It was recommended that this medication be discontinued.  No longer having hallucinations. She is now on tylenol with codeine but isn't getting it as frequent..   Is on Aricept.  She is having some n/v at nighttime.  More confusion with ADL's.  More trouble getting dressed.    Had PT/ST/OT in the home and she did well with that.  Neuroimaging has  previously been performed.  An MRI brain was last done in 2010 but pt can no longer have MRI's because of an implantable hearing device.    PREVIOUS MEDICATIONS: none to date  ALLERGIES:  No Known Allergies  CURRENT MEDICATIONS:  Current Outpatient Medications on File Prior to Visit  Medication Sig Dispense Refill  . acetaminophen (TYLENOL) 325 MG tablet Take 650 mg by mouth every 6 (six) hours as needed for mild pain, moderate pain, fever or headache.     [name] acetaminophen-codeine (TYLENOL #4) 300-60 MG tablet Take 1 tablet by mouth every 4 (four) hours as needed for pain.    [name] acyclovir (ZOVIRAX) 400 MG tablet TAKE 1 TABLET BY MOUTH TWICE DAILY 120 tablet 3  . Calcium Carbonate-Vitamin D3 (CALCIUM 600-D) 600-400 MG-UNIT TABS Take 1 tablet by mouth at bedtime.    . carbidopa-levodopa (SINEMET IR) 25-100 MG tablet TAKE 1 TABLET  BY MOUTH DAILY IN THE MORNING, 1 IN THE AFTERNOON, AND 2 TABLETS IN THE EVENING 360 tablet 1  . cetirizine (ZYRTEC) 10 MG tablet Take 10 mg by mouth daily at 12 noon.     . cholecalciferol (VITAMIN D) 1000 units tablet Take 1,000 Units by mouth every evening.     . clobetasol (TEMOVATE) 0.05 % external solution Apply 1 application topically daily.     [name] donepezil (ARICEPT) 10 MG tablet TAKE 1 TABLET BY MOUTH DAILY 90 tablet 1  . entacapone (COMTAN) 200 MG tablet TAKE 1 TABLET BY MOUTH 3 TIMES DAILY WITH SINEMET 270 tablet 1  . Fluticasone-Salmeterol (ADVAIR) 250-50 MCG/DOSE AEPB Inhale 1 puff into the lungs 2 (two) times  daily.    [name] gabapentin (NEURONTIN) 250 MG/5ML solution 1 ML ONCE A DAY AT BEDTIME ORALLY 30 DAY(S)  0  . ketoconazole (NIZORAL) 2 % shampoo Apply 1 application topically 2 (two) times a week.     . mirabegron ER (MYRBETRIQ) 50 MG TB24 tablet Take 50 mg by mouth daily at 12 noon.     . Multiple Vitamins-Minerals (MULTIVITAMIN WITH MINERALS) tablet Take 1 tablet by mouth daily with breakfast.     . ondansetron (ZOFRAN) 8 MG tablet Take 8 mg by mouth at bedtime as needed for nausea or vomiting.     . pantoprazole (PROTONIX) 40 MG tablet Take 40 mg by mouth daily.     . sertraline (ZOLOFT) 100 MG tablet Take 1 tablet (100 mg total) by mouth daily. 90 tablet 1   No current facility-administered medications on file prior to visit.     PAST MEDICAL HISTORY:   Past Medical History:  Diagnosis Date  . Anxiety   . Asthma   . Depression   . Diabetes mellitus without complication ([place])    4/58/09.Marland KitchenMarland Kitchenpt denies  . GERD (gastroesophageal reflux disease)   . Hearing loss   . NHL (non-Hodgkin's lymphoma) ([place])    nhl dx 9/04 breast ca dx1/12  . Parkinson disease ([place])     PAST SURGICAL HISTORY:   Past Surgical History:  Procedure Laterality Date  . Ba-HA Ear implant    . ESOPHAGOGASTRODUODENOSCOPY N/A 02/04/2017   Procedure: ESOPHAGOGASTRODUODENOSCOPY (EGD);  Surgeon: Arta Silence, MD;  Location: [facility];  Service: Endoscopy;  Laterality: N/A;  . MASTECTOMY  2 /8/ 12   bilateral  . MASTECTOMY      SOCIAL HISTORY:   Social History   Socioeconomic History  . Marital status: Married    Spouse name: Not on file  . Number of children: Not on file  . Years of education: Not on file  . Highest education level: Not on file  Occupational History  . Occupation: retired    Fish farm manager: RETIRED    Comment: [employer]  Social Needs  . Financial resource strain: Not on file  . Food insecurity:    Worry: Not on file    Inability: Not on file  . Transportation needs:    Medical: Not on  file    Non-medical: Not on file  Tobacco Use  . Smoking status: Never Smoker  . Smokeless tobacco: Never Used  . Tobacco comment: husband wsa smoker  Substance and Sexual Activity  . Alcohol use: No    Comment: no  . Drug use: No  . Sexual activity: Not on file  Lifestyle  . Physical activity:    Days per week: Not on file    Minutes per session: Not on file  .  Stress: Not on file  Relationships  . Social connections:    Talks on phone: Not on file    Gets together: Not on file    Attends religious service: Not on file    Active member of club or organization: Not on file    Attends meetings of clubs or organizations: Not on file    Relationship status: Not on file  . Intimate partner violence:    Fear of current or ex partner: Not on file    Emotionally abused: Not on file    Physically abused: Not on file    Forced sexual activity: Not on file  Other Topics Concern  . Not on file  Social History Narrative  . Not on file    FAMILY HISTORY:   Family Status  Relation Name Status  . Father  Deceased       CA, lung  . Mother  Deceased       CA; complications of splenectomy  . Brother  Deceased       3, renal failure, DM-2; CAD; CA  . Child  Alive       5 (4 biological), one with PKU    ROS:  Review of Systems  Constitutional: Positive for malaise/fatigue.  HENT: Negative.   Eyes: Negative.   Cardiovascular: Negative.   Gastrointestinal: Positive for diarrhea.  Genitourinary: Positive for frequency.  Skin: Negative.   '  PHYSICAL EXAMINATION:    VITALS:   Vitals:   06/16/18 1052  BP: 138/74  Pulse: 74  SpO2: 98%  Weight: 119 lb (54 kg)  Height: 5' 0.5" (1.537 m)   Wt Readings from Last 3 Encounters:  06/16/18 119 lb (54 kg)  02/28/18 118 lb 8 oz (53.8 kg)  02/11/18 119 lb (54 kg)     GEN:  The patient appears stated age.  She is very anxious today CV:  RRR with 3/6 SEM Lungs:  CTAB. She has several fast breaths when anxious (same as previous  visits) Neck:  No bruits but cardiac murmur radiates to the bilateral carotids   Neurological examination:  Orientation: She is alert to person and place [facility] Cognitive Assessment  05/13/2016 09/11/2015 12/14/2014  Visuospatial/ Executive (0/5) 3 3 3   Naming (0/3) 2 3 3   Attention: Read list of digits (0/2) 2 1 2   Attention: Read list of letters (0/1) 1 1 1   Attention: Serial 7 subtraction starting at 100 (0/3) 0 0 0  Language: Repeat phrase (0/2) 1 0 1  Language : Fluency (0/1) 1 1 1   Abstraction (0/2) 1 1 1   Delayed Recall (0/5) 0 0 0  Orientation (0/6) 5 6 6   Total 16 16 18   Adjusted Score (based on education) 17 17 19    Cranial nerves: There is good facial symmetry.  The visual fields are full to confrontational testing. The speech is fluent and clear.  She is hypophonic.  Soft palate rises symmetrically and there is no tongue deviation. Hearing is markedly decreased today even with hearing aid (same as previous) Sensation: Sensation is intact to light outh throughout. Motor: Strength is 5/5 in the bilateral upper and lower extremities.   Shoulder shrug is equal and symmetric.  There is no pronator drift.   Movement examination: Tone: There is no rigidity today Abnormal movements: There is no tremor Coordination:  There is no decremation noted today Gait and Station: The patient pushes off of the chair.  She walks well with her cane  Labs:  Lab  Results  Component Value Date   WBC 4.3 03/21/2018   HGB 9.8 (L) 03/21/2018   HCT 28.9 (L) 03/21/2018   MCV 88.7 03/21/2018   PLT 192 03/21/2018     Chemistry      Component Value Date/Time   NA 138 03/21/2018 1710   NA 143 04/13/2017 1155   K 3.9 03/21/2018 1710   K 4.1 04/13/2017 1155   CL 101 03/21/2018 1710   CL 106 12/26/2012 1110   CO2 28 03/21/2018 1710   CO2 27 04/13/2017 1155   BUN 21 03/21/2018 1710   BUN 19.5 04/13/2017 1155   CREATININE 1.01 (H) 03/21/2018 1710   CREATININE 1.18 (H) 01/03/2018 1107    CREATININE 1.2 (H) 04/13/2017 1155      Component Value Date/Time   CALCIUM 8.9 03/21/2018 1710   CALCIUM 9.6 04/13/2017 1155   ALKPHOS 83 03/21/2018 1710   ALKPHOS 97 04/13/2017 1155   AST 23 03/21/2018 1710   AST 20 01/03/2018 1107   AST 20 04/13/2017 1155   ALT 8 03/21/2018 1710   ALT <6 01/03/2018 1107   ALT 7 04/13/2017 1155   BILITOT 0.5 03/21/2018 1710   BILITOT 0.4 01/03/2018 1107   BILITOT 0.31 04/13/2017 1155      Lab Results  Component Value Date   VITAMINB12 1053 (H) 06/10/2006   Lab Results  Component Value Date   TSH 2.93 09/19/2012    ASSESSMENT/PLAN:  1.  Idiopathic Parkinson's disease, dx 08/2012.    -We discussed the diagnosis as well as pathophysiology of the disease.  We discussed treatment options as well as prognostic indicators.  Patient education was provided.  - continue carbidopa/levodopa 25/100, 1/1/2. She needs to avoid protein when taking levodopa and discussed this.  Risks, benefits, side effects and alternative therapies were discussed.  The opportunity to ask questions was given and they were answered to the best of my ability.  The patient expressed understanding and willingness to follow the outlined treatment protocols.  -stop comtan due to trying to simplify regimen and memory issues.    -do home exercises  -follow a schedule.  Stay awake/engaged in day 2.  Anxiety  -she is still on sertraline 50 mg daily.   3.  Cardiac murmur  -being followed yearly by cardiology for mod AS 4.  PDD  -hold aricept due to n/v.  Doubt from aricept given been on this a long time but will hold it for now anyway.    - Is free of hallucinations but if occur again, will consider nuplazid.   Had some with ultram.  Better now.  Limit now with pain medication. 5. Urinary incontinence  -seeing [facility] for neurostim. 6.  low back pain  -seeing Dr. Ellene Route.  On tylenol with codeine.  Reiterated to limit pain meds 7.  Follow up is anticipated in the next few  months, sooner should new neurologic issues arise.  Much greater than 50% of this visit was spent in counseling and coordinating care.  Total face to face time:  25 min

## 2018-06-16 ENCOUNTER — Encounter: Payer: Self-pay | Admitting: Neurology

## 2018-06-16 ENCOUNTER — Ambulatory Visit (INDEPENDENT_AMBULATORY_CARE_PROVIDER_SITE_OTHER): Payer: Medicare Other | Admitting: Neurology

## 2018-06-16 VITALS — BP 138/74 | HR 74 | Ht 60.5 in | Wt 119.0 lb

## 2018-06-16 DIAGNOSIS — F0280 Dementia in other diseases classified elsewhere without behavioral disturbance: Secondary | ICD-10-CM

## 2018-06-16 DIAGNOSIS — G20 Parkinson's disease: Secondary | ICD-10-CM | POA: Diagnosis not present

## 2018-06-16 NOTE — Patient Instructions (Addendum)
1.  Stop donepezil.   2.  Stop entacapone 3.  Do the therapy exercises that they left you when Physical, occupational and speech therapy left 4.  Use your pedal bike at home

## 2018-07-01 DIAGNOSIS — R350 Frequency of micturition: Secondary | ICD-10-CM | POA: Diagnosis not present

## 2018-07-04 DIAGNOSIS — R413 Other amnesia: Secondary | ICD-10-CM | POA: Diagnosis not present

## 2018-07-04 DIAGNOSIS — Z853 Personal history of malignant neoplasm of breast: Secondary | ICD-10-CM | POA: Diagnosis not present

## 2018-07-04 DIAGNOSIS — E7800 Pure hypercholesterolemia, unspecified: Secondary | ICD-10-CM | POA: Diagnosis not present

## 2018-07-04 DIAGNOSIS — Z8572 Personal history of non-Hodgkin lymphomas: Secondary | ICD-10-CM | POA: Diagnosis not present

## 2018-07-04 DIAGNOSIS — G20 Parkinson's disease: Secondary | ICD-10-CM | POA: Diagnosis not present

## 2018-07-04 DIAGNOSIS — I10 Essential (primary) hypertension: Secondary | ICD-10-CM | POA: Diagnosis not present

## 2018-07-04 DIAGNOSIS — N3281 Overactive bladder: Secondary | ICD-10-CM | POA: Diagnosis not present

## 2018-07-04 DIAGNOSIS — N183 Chronic kidney disease, stage 3 (moderate): Secondary | ICD-10-CM | POA: Diagnosis not present

## 2018-07-04 DIAGNOSIS — G8929 Other chronic pain: Secondary | ICD-10-CM | POA: Diagnosis not present

## 2018-07-04 DIAGNOSIS — F419 Anxiety disorder, unspecified: Secondary | ICD-10-CM | POA: Diagnosis not present

## 2018-08-01 DIAGNOSIS — J209 Acute bronchitis, unspecified: Secondary | ICD-10-CM | POA: Diagnosis not present

## 2018-08-03 ENCOUNTER — Encounter (HOSPITAL_COMMUNITY): Payer: Self-pay | Admitting: Emergency Medicine

## 2018-08-03 ENCOUNTER — Emergency Department (HOSPITAL_COMMUNITY)
Admission: EM | Admit: 2018-08-03 | Discharge: 2018-08-04 | Disposition: A | Discharge status: Home / Self Care | Payer: Medicare Other | Attending: Emergency Medicine | Admitting: Emergency Medicine

## 2018-08-03 ENCOUNTER — Emergency Department (HOSPITAL_COMMUNITY): Payer: Medicare Other

## 2018-08-03 DIAGNOSIS — F0390 Unspecified dementia without behavioral disturbance: Secondary | ICD-10-CM | POA: Insufficient documentation

## 2018-08-03 DIAGNOSIS — Z79899 Other long term (current) drug therapy: Secondary | ICD-10-CM | POA: Insufficient documentation

## 2018-08-03 DIAGNOSIS — W19XXXA Unspecified fall, initial encounter: Secondary | ICD-10-CM | POA: Diagnosis not present

## 2018-08-03 DIAGNOSIS — W010XXA Fall on same level from slipping, tripping and stumbling without subsequent striking against object, initial encounter: Secondary | ICD-10-CM | POA: Diagnosis not present

## 2018-08-03 DIAGNOSIS — S79912A Unspecified injury of left hip, initial encounter: Secondary | ICD-10-CM | POA: Diagnosis not present

## 2018-08-03 DIAGNOSIS — Z853 Personal history of malignant neoplasm of breast: Secondary | ICD-10-CM | POA: Diagnosis not present

## 2018-08-03 DIAGNOSIS — S0990XA Unspecified injury of head, initial encounter: Secondary | ICD-10-CM | POA: Diagnosis not present

## 2018-08-03 DIAGNOSIS — R0689 Other abnormalities of breathing: Secondary | ICD-10-CM | POA: Diagnosis not present

## 2018-08-03 DIAGNOSIS — J45909 Unspecified asthma, uncomplicated: Secondary | ICD-10-CM | POA: Diagnosis not present

## 2018-08-03 DIAGNOSIS — I10 Essential (primary) hypertension: Secondary | ICD-10-CM | POA: Diagnosis not present

## 2018-08-03 DIAGNOSIS — M25552 Pain in left hip: Secondary | ICD-10-CM | POA: Diagnosis not present

## 2018-08-03 DIAGNOSIS — E119 Type 2 diabetes mellitus without complications: Secondary | ICD-10-CM | POA: Insufficient documentation

## 2018-08-03 DIAGNOSIS — Y939 Activity, unspecified: Secondary | ICD-10-CM | POA: Diagnosis not present

## 2018-08-03 DIAGNOSIS — Y92009 Unspecified place in unspecified non-institutional (private) residence as the place of occurrence of the external cause: Secondary | ICD-10-CM | POA: Insufficient documentation

## 2018-08-03 DIAGNOSIS — Y998 Other external cause status: Secondary | ICD-10-CM | POA: Diagnosis not present

## 2018-08-03 DIAGNOSIS — G20 Parkinson's disease: Secondary | ICD-10-CM | POA: Insufficient documentation

## 2018-08-03 DIAGNOSIS — R0902 Hypoxemia: Secondary | ICD-10-CM | POA: Diagnosis not present

## 2018-08-03 DIAGNOSIS — R52 Pain, unspecified: Secondary | ICD-10-CM | POA: Diagnosis not present

## 2018-08-03 NOTE — Discharge Instructions (Addendum)
Return here as needed.  The x-rays did not show any abnormalities.  Follow-up with her primary doctor.

## 2018-08-03 NOTE — ED Triage Notes (Addendum)
  Patient BIB EMS after mechanical fall at home.  Patient was being helped to the bathroom by her husband and fell on top of him.  No complaints of pain on arrival.  No deformities or hematomas.  Patient has hx of dementia and is A&O x2.  Patient was seen earlier today for cold/flu like symptoms.  Patient did not hit her head.  Does not take blood thinners.

## 2018-08-03 NOTE — ED Provider Notes (Signed)
[facility] EMERGENCY DEPARTMENT Provider Note   CSN: [id number] Arrival date & time: 08/03/18  2040     History   Chief Complaint Chief Complaint  Patient presents with  . Fall    HPI Oaklyn Jakubek is a 85 y.o. female.  HPI Patient presents to the emergency department with injuries following a fall patient was walking to the bathroom when her husband states that she normally needs help walking and she got up without his assistance.  He states that she fell on top of him and did not hit the ground very hard.  Patient is unable to me history due to the fact that she has dementia.  Patient has no significant complaints other than some mild left hip pain. Past Medical History:  Diagnosis Date  . Anxiety   . Asthma   . Depression   . Diabetes mellitus without complication ([place])    6/73/41.Marland KitchenMarland Kitchenpt denies  . GERD (gastroesophageal reflux disease)   . Hearing loss   . NHL (non-Hodgkin's lymphoma) ([place])    nhl dx 9/04 breast ca dx1/12  . Parkinson disease [facility])     Patient Active Problem List   Diagnosis Date Noted  . GIB (gastrointestinal bleeding) 02/03/2017  . CAP (community acquired pneumonia) 04/13/2016  . PNA (pneumonia) 04/13/2016  . Murmur 09/02/2015  . Parkinson disease ([place]) 09/02/2015  . Bilateral carotid bruits 09/02/2015  . Confusion 08/07/2015  . Acute encephalopathy 08/07/2015  . UTI (lower urinary tract infection) 08/07/2015  . Acute kidney failure ([place]) 08/07/2015  . Paralysis agitans ([place]) 09/19/2012  . Lymphoma ([place]) 07/03/2011  . Malignant neoplasm of female breast ([place]) 07/03/2011  . Abnormal CT of the chest 03/13/2011  . HYPERTENSION, BENIGN 01/01/2009  . EDEMA 01/01/2009    Past Surgical History:  Procedure Laterality Date  . Ba-HA Ear implant    . ESOPHAGOGASTRODUODENOSCOPY N/A 02/04/2017   Procedure: ESOPHAGOGASTRODUODENOSCOPY (EGD);  Surgeon: Arta Silence, MD;  Location: [facility];  Service: Endoscopy;  Laterality:  N/A;  . MASTECTOMY  2 /8/ 12   bilateral  . MASTECTOMY       OB History   No obstetric history on file.      Home Medications    Prior to Admission medications   Medication Sig Start Date End Date Taking? Authorizing Provider  acetaminophen (TYLENOL) 325 MG tablet Take 650 mg by mouth every 6 (six) hours as needed for mild pain, moderate pain, fever or headache.    Yes [provider]  amoxicillin (AMOXIL) 500 MG capsule Take 500 mg by mouth 2 (two) times daily. 08/01/18 08/10/18 Yes [provider]  benzonatate (TESSALON) 200 MG capsule Take 200 mg by mouth 3 (three) times daily as needed for cough.   Yes [provider]  Calcium Carbonate-Vitamin D3 (CALCIUM 600-D) 600-400 MG-UNIT TABS Take 1 tablet by mouth at bedtime.   Yes [provider]  carbidopa-levodopa (SINEMET IR) 25-100 MG tablet TAKE 1 TABLET BY MOUTH DAILY IN THE MORNING, 1 IN THE AFTERNOON, AND 2 TABLETS IN THE EVENING Patient taking differently: Take 1-2 tablets by mouth See admin instructions. Take one tablet by mouth daily in the morning, one tablet in the afternoon and two tablets in the evening. 01/31/18  Yes Tat, Eustace Quail, DO  cetirizine (ZYRTEC) 10 MG tablet Take 10 mg by mouth daily at 12 noon.    Yes [provider]  cholecalciferol (VITAMIN D) 1000 units tablet Take 1,000 Units by mouth every evening.    Yes  [provider]  Fluticasone-Salmeterol (ADVAIR) 250-50 MCG/DOSE AEPB Inhale 1 puff into the lungs 2 (two) times daily.   Yes [provider]  ipratropium (ATROVENT) 0.03 % nasal spray Place 2 sprays into both nostrils 2 (two) times daily.   Yes [provider]  ketoconazole (NIZORAL) 2 % shampoo Apply 1 application topically See admin instructions. 3 times weekly as needed for dry skin.   Yes [provider]  mirabegron ER (MYRBETRIQ) 50 MG TB24 tablet Take 50 mg by mouth daily at 12 noon.    Yes [provider]  Multiple  Vitamins-Minerals (MULTIVITAMIN WITH MINERALS) tablet Take 1 tablet by mouth daily with breakfast.    Yes [provider]  ondansetron (ZOFRAN) 8 MG tablet Take 8 mg by mouth 2 (two) times daily.    Yes [provider]  pantoprazole (PROTONIX) 40 MG tablet Take 40 mg by mouth daily.    Yes [provider]  polyethylene glycol powder (MIRALAX) powder Take 17 g by mouth daily as needed for mild constipation.   Yes [provider]  sertraline (ZOLOFT) 100 MG tablet Take 1 tablet (100 mg total) by mouth daily. 03/04/18  Yes Tat, Eustace Quail, DO  acyclovir (ZOVIRAX) 400 MG tablet TAKE 1 TABLET BY MOUTH TWICE DAILY Patient taking differently: Take 400 mg by mouth 2 (two) times daily.  06/06/18   [name], MD  donepezil (ARICEPT) 10 MG tablet TAKE 1 TABLET BY MOUTH DAILY Patient not taking: Reported on 08/03/2018 04/04/18   Tat, Eustace Quail, DO  entacapone (COMTAN) 200 MG tablet TAKE 1 TABLET BY MOUTH 3 TIMES DAILY WITH SINEMET Patient taking differently: Take 200 mg by mouth 3 (three) times daily.  04/18/18   TatEustace Quail, DO    Family History Family History  Problem Relation Age of Onset  . Lung cancer Father   . Cancer Father        lung  . Cancer Mother     Social History Social History   Tobacco Use  . Smoking status: Never Smoker  . Smokeless tobacco: Never Used  . Tobacco comment: husband wsa smoker  Substance Use Topics  . Alcohol use: No    Comment: no  . Drug use: No     Allergies   Azithromycin and Tramadol   Review of Systems Review of Systems Level 5 caveat applies due to dementia  Physical Exam Updated Vital Signs BP (!) 175/76   Pulse 91   Temp 98.8 F (37.1 C) (Oral)   Resp (!) 29   LMP  (LMP Unknown)   SpO2 93%   Physical Exam Vitals signs and nursing note reviewed.  Constitutional:      General: She is not in acute distress.    Appearance: She is well-developed.  HENT:     Head: Normocephalic and atraumatic.      Nose: Nose normal.  Eyes:     Pupils: Pupils are equal, round, and reactive to light.  Cardiovascular:     Rate and Rhythm: Normal rate and regular rhythm.     Pulses: Normal pulses.  Pulmonary:     Effort: Pulmonary effort is normal.  Musculoskeletal:     Left hip: She exhibits tenderness. She exhibits normal range of motion, normal strength, no bony tenderness, no swelling, no crepitus and no deformity.  Skin:    General: Skin is warm and dry.  Neurological:     Mental Status: She is alert and oriented to person, place,  and time.      ED Treatments / Results  Labs (all labs ordered are listed, but only abnormal results are displayed) Labs Reviewed - No data to display  EKG EKG Interpretation  Date/Time:  Wednesday August 03 2018 20:50:06 EST Ventricular Rate:  84 PR Interval:    QRS Duration: 93 QT Interval:  388 QTC Calculation: 459 R Axis:   56 Text Interpretation:  Sinus rhythm Probable left ventricular hypertrophy Confirmed by Virgel Manifold [phone]) on 08/03/2018 9:34:10 PM   Radiology Ct Head Wo Contrast  Result Date: 08/03/2018 CLINICAL DATA:  Fall, head injury EXAM: CT HEAD WITHOUT CONTRAST TECHNIQUE: Contiguous axial images were obtained from the base of the skull through the vertex without intravenous contrast. COMPARISON:  08/09/2015 FINDINGS: Brain: No acute territorial infarction, hemorrhage, or intracranial mass. Atrophy and small vessel ischemic changes of the white matter. Chronic lacunar infarct left basal ganglia. Stable ventricle size. Vascular: No hyperdense vessels.  Carotid vascular calcification Skull: Postsurgical changes of right mastoid. Metallic hearing device in the right posterior skull as before. Sinuses/Orbits: Opacified right maxillary sinus with mucosal thickening and fluid left maxillary sinus. Diffuse opacification of the sphenoid and ethmoid sinuses with fluid levels in the sphenoid sinus. No definitive skull base lucency. Frontal sinus fluid  levels. Other: None IMPRESSION: 1. No CT evidence for acute intracranial abnormality. Atrophy and small vessel ischemic changes of the white matter 2. Fluid levels in the sphenoid sinuses with diffuse opacity, no definitive skull base lucency/fracture is seen. Extensive fluid and mucosal disease of the sinuses consistent with sinusitis. Electronically Signed   By: Donavan Foil M.D.   On: 08/03/2018 23:45   Dg Hip Unilat W Or Wo Pelvis 2-3 Views Left  Result Date: 08/03/2018 CLINICAL DATA:  Fall with left hip pain EXAM: DG HIP (WITH OR WITHOUT PELVIS) 2-3V LEFT COMPARISON:  None. FINDINGS: Mild SI joint degenerative change. Pubic symphysis and rami are intact. No fracture or malalignment. Joint space is maintained. IMPRESSION: No acute osseous abnormality. Electronically Signed   By: Donavan Foil M.D.   On: 08/03/2018 23:46    Procedures Procedures (including critical care time)  Medications Ordered in ED Medications - No data to display   Initial Impression / Assessment and Plan / ED Course  I have reviewed the triage vital signs and the nursing notes.  Pertinent labs & imaging results that were available during my care of the patient were reviewed by me and considered in my medical decision making (see chart for details).     Patient will be discharged home she has negative imaging at this time.  Patient be advised to follow-up with their primary doctor.  Told to return here as needed.  Final Clinical Impressions(s) / ED Diagnoses   Final diagnoses:  None    ED Discharge Orders    None       Rebeca Allegra 08/03/18 2356    Virgel Manifold, MD 08/04/18 1606

## 2018-08-04 DIAGNOSIS — W19XXXA Unspecified fall, initial encounter: Secondary | ICD-10-CM | POA: Diagnosis not present

## 2018-08-04 DIAGNOSIS — Z7401 Bed confinement status: Secondary | ICD-10-CM | POA: Diagnosis not present

## 2018-08-04 DIAGNOSIS — M2550 Pain in unspecified joint: Secondary | ICD-10-CM | POA: Diagnosis not present

## 2018-08-10 NOTE — Progress Notes (Signed)
[name] was seen today in the movement disorders clinic for neurologic consultation at the request of Dr. Betsy Coder.    The consultation is for the evaluation of ataxia and tremor.  The patient has been a previous pt of Dr. Erling Cruz.  Dr. Erling Cruz dx the patient with parkinsonism and ET.  This is an 85 y/o female with a complex medical hx of both non Hodgkins lymphoma require chemotherapy and breast CA, s/p bilateral mastectomy.  She has a long hx of tremor.  I reviewed Dr. Bernardo Heater notes that were made available to me.  It appears that she presented with right hand resting tremor in 2009, without associated rigidity or bradykinesia.  There were no known medications causing the problem and it appears that she was initially dx with ET and later transitioned the dx to parkinsonism.  The patient reports no tremor in the L hand.  Her husband reports a little tremor in the R leg.  The patient reports that tremor is most significant with relaxing and she has no tremor if her hands are preoccupied.    She has never been on PD medication or been to PT until yesterday.  03/16/13 update:  The pt presents today with her husband.  She has not been seen since February, 2014 at which time I dx her with PD.  She was started on levodopa 25/100 three times per day.  She did not follow up sooner as she had bronchitis and pneumonia for the last 2 months and is just now recovering.  She did go to PT and liked it but would like to go back.  She is doing well on the levodopa.  Her husband rarely notice tremor any longer.  06/28/13 update:    The pt today presents for f/u with her husband who supplements the hx.  She is on carbidopa/levodopa 25/100 three times per day.  She is doing well but notices R arm tremor now that had gone away.  Rarely she will note tremor in the L hand which she had not noted previously.  She does have some cramping of the feet.  She is unsure if this is associated with wearing off.  She did have some  of this while in my office, and it was time for her to redose the carbidopa/levodopa.  She is now attending therapy, and is enjoying and finding benefit from it.  08/08/12 update:  The patient presents today with her husband, who supplements the history.  The patient has Parkinson's disease and is currently on carbidopa/levodopa 25/100, one tablet 3 times per day.  Last visit, she was describing some cramping in her feet and I was trying to figure out whether or not this was an "off" phenomenon.  After paying attention, the patient called and said that she thought it was and we added entacapone.  The cramping in the feet is better but she is now having a stabbing and shooting pain down the back of the leg and to the lateral side of the lower leg that stops at the ankle.  It has stopped her from exercising.  She sometimes has back pain.  This is fairly inconsistent.  The leg pain seems to start about 4 days after she finished rehabilitation, which did seem to help.  She is not sure if the pain is related to the rehabilitation, however.  10/03/13 update:  The patient presents today with her husband, who supplements the history.  The patient is currently on carbidopa/levodopa 25/100, one  tablet 3 times per day.  She is also on Comtan 200 mg, one tablet 3 times per day.  From a Parkinson's standpoint, she has been doing well.  No falls.  No hallucinations.  Her husband rarely sees tremor, and that is primarily when she is in bed at night and he will feel it rather than see it.  Last visit, she was complaining of back pain that was likely sciatica.  I ordered an EMG but they ultimately canceled that.  I tried her on Lyrica but that caused her to be excessively sleepy.  She went to the chiropractor and states that she is doing much better from that regard.  Unfortunately, she has had bronchitis and a urinary tract infection.  She has been on several antibiotics, which have caused her to be nauseated.  She has also had  dental work and had several of her teeth pulled.  Because of these things, she has had a decreased appetite and really has not been able to eat.  She had a temporary partial placed, but her appetite still hasn't been what it used to be, primarily because of the nausea from the antibiotic.  She has not been exercising because of being sick.  She is hoping to get back to that soon.  02/02/14 update:  The patient presents today with her husband, who supplements the history.  The patient is currently on carbidopa/levodopa 25/100, 1 tablet at 9 AM/1 PM and 2 tablets at 6 PM.   She is also on Comtan 200 mg, one tablet 3 times per day.   The discomfort in the foot is much better.  From a Parkinson's standpoint, she has been doing well.  No falls.  No hallucinations.  The patient was screened through our Parkinson's screening program on 01/16/2014 and was either at baseline or better than baseline at all therapies and is scheduled for re-screening in January.  She is not currently exercising.    06/05/14 update:  The patient presents today with her husband, who supplements the history.  The patient is currently on carbidopa/levodopa 25/100, 1 tablet at 9 AM/1 PM and 2 tablets at 6 PM.   She is also on Comtan 200 mg, one tablet 3 times per day.  No falls.  No lightheadedness or near syncope.  No hallucinations.   I reviewed records since last visit.  Weight loss has been a concern.  The dietician has talked with her about suggestions to help, and the patient states that she was told to take Carnation 3 times per day.  Pt states that the biggest issue that has caused weight loss has been persistent nausea, which has been going on for about 2 months.  They really do not think that it is related to the Parkinson's medications, because it did not start with changes in Parkinson's medications.  It actually seemed to start after a visit to a restaurant and then persisted.  She just feels nauseated, has lost taste for food and  has lost appetite.  She followed up with Dr. Kenton Kingfisher and was started on Zantac about 3 or 4 days ago and does feel better and her blood pressure medication was discontinued.  10/04/14 update:  The patient presents today with her husband, who supplements the history.  The patient is currently on carbidopa/levodopa 25/100, 1 tablet at 9 AM/1 PM and 2 tablets at 6 PM.   She is also on Comtan 200 mg, one tablet 3 times per day. We d/c her comtan  for a week to see if her GI upset changed at all and it did not so we restarted it since obviously that was not the etiology.  She continues to have GI upset in the AM.  She went to her PCP and she was given Zofran and states that it made it worse but she only took it once.  It is only the AM when she has nausea and she does better the rest of the day.    12/14/14 update:  The patient returns today, accompanied by her husband who supplements the history.  Records were reviewed since last visit.  She has a history of breast cancer and was having weight loss/anorexia and saw her oncologist.  She ended up having a CT chest, abdomen and pelvis and those were unremarkable for any new lesions.  She has lost about 13 pounds since March of last year.  She has been complaining about nausea the last several visits.  In fact, we've previously stopped her entacapone to see if that would help the nausea, but it did not.  She did not think that it was related at all to her levodopa.  She no longer has the nausea but in the AM she doesn't feel good.  She has difficulty describing what this means.  She remains on carbidopa/levodopa 25/100, 1 tablet at 9 AM/one tablet at 1 PM and 2 tablets at 6 PM.  She is still on entacapone, 200 mg 3 times a day.  They brought with them a list of blood pressures and while the SBP is generally high (170's-190's without med and 150's-180's with BP med) it was never over 200.  She states that she was off the BP med a few days and felt better but when she went  back on it she felt worse again.  She was told to go back on it because it her BP's were too high.  She is only on amlodipine 2.5 mg.    03/05/15 update:  The patient is following up today regarding her Parkinson's disease.  She is currently on carbidopa/levodopa 25/100, 1 tablet at 9 AM, 1 tablet at 1 PM and 2 tablets at 6 PM.  She takes entacapone with each of these dosages.  At last visit, she was very focused on her blood pressure and I asked her to follow-up with her primary care physician to see if she actually needed her blood pressure medication.  Today, the patient and her husband state that she was able to d/c her amlodipine and her BP has been well controlled. She states that the nausea is better.  She states that she gets up in the AM and takes her med and just feels jittery and anxious.  Her husband asks about trying to take a levodopa at bedtime to see if that helps.  When she takes her xanax, the anxiety does seem to get better.  They were thinking about asking PCP to see if she can go up on the medication.  Husband admits that balance sometimes off and cognition not great.  PT did not think that she needed walker per their hx.  She has gotten back to her exercise.   By 3-4 pm, pt feels well.    06/11/15 update:  The patient presents today for follow-up, accompanied by her husband who supplements the history.  She is on carbidopa/levodopa 25/100, 1 tablet at 9 AM, 1 tablet at 1 PM and 2 tablets at 6 PM.  She is on entacapone  200 mg 3 times a day and last visit we added carbidopa/levodopa 50/200 at night.  She didn't notice a big difference with that. Her husband isn't sure that she really gave it enough time to know.   Her biggest issue last visit was her anxiety and I told her to try an extra levodopa when she is anxious to see if that helps.  They tried that and it didn't help.    I did tell her that I did not want her taking daily Xanax.  Today, the patient states that the "only thing that helps  is xanax."   Unfortunately, she has not been exercising.  Her husband is undergoing tx daily for prostate CA and they are consumed with that.  Balance has been okay and her husband states that she is doing well in that regard.  No falls.  She is sleeping from 9pm until 5-6am. She is taking naps during the day.   09/11/15 update:  The patient follows up today, accompanied by her husband who supplements the history.  She is on carbidopa/levodopa 25/100, 1 tablet at 9 AM, 1 at 1 PM and 2 tablets at 6 PM.  I asked her to retry carbidopa/levodopa 50/200 at night last visit.  She is still on the entacapone 200 mg 3 times per day.  Last visit, I encouraged her to discontinue her daily Xanax and we started her on Wellbutrin XL for her anxiety.  Her husband thinks that she is still on it (and off of xanax) but wellbutrin is not on her medication list today.  Anxiety has been better per both patient and husband.  Husband states that Dr. Kenton Kingfisher told them to ask me about sertraline.  She was going to come in and talk to me on January 11, but ended up having increased hallucinations and was admitted to the hospital from January 11 to 08/11/2015.  She was treated for urinary tract infection although her urine culture ended up being negative.  Husband reports that she is 90-95% better from a mental standpoint.  No hallucinations since d/c from the hospital.  Doing home PT/OT/ST and nurse comes once per week.    01/09/16 update:  The patient follows up today, accompanied by her husband who supplements the history.  She is on carbidopa/levodopa 25/100, 1 tablet at 9 AM, 1 at 1 PM and 2 tablets at 6 PM.  For some reason, her carbidopa/levodopa 50/200 got accidentally stopped, and I told them to just go ahead and hold that for now.  She is still on the entacapone 200 mg 3 times per day.  Last visit, her husband was not sure if she was still on the Wellbutrin and ended up calling me back.  This was stopped during her hospitalization.   She, therefore, started on Zoloft instead right after our last visit.  She took a single 25 mg tablet and felt nauseated, but I reminded her that she feels nauseated a lot in the morning and asked her to retry it, telling her that she could take it before bedtime and then work up to 50 mg at night.  She is only on 25 mg and husband states that she is doing well and anxiety is better.  Only time an issue is that she hates to get in a car and travel.    Nausea is also better.    05/13/16 update:  The patient follows up today, accompanied by her husband who supplements the history.  She is on  carbidopa/levodopa 25/100, 1 tablet at 9 AM, 1 at 1 PM and 2 tablets at 6 PM.  She is on entacapone 200 mg 3 times a day with each of these dosages.  For the anxiety and depression, she is on Zoloft which has been increased to 50 mg daily.  That has helped.  She was hospitalized from 9/18-9/22 for CAP.  The records that were made available to me were reviewed. Having a facet joint injection tomorrow by Dr. Jacelyn Grip for back pain.  Not been able to get back to exercise because of pneumonia and back pain.  Has PD screen on 11/28.  08/19/16 update:  The patient follows up today, accompanied by her husband who supplements the history.  She is on carbidopa/levodopa 25/100, 1 tablet at 9 AM, 1 at 1 PM and 2 tablets at 6 PM.  She is on entacapone 200 mg 3 times a day with each of these dosages.  For the anxiety and depression, she is on Zoloft which has been increased to 50 mg daily.  Husband states that "she is not as happy as the gal I married."  The records that were made available to me were reviewed.  Pt denies falls.  Pt denies lightheadedness, near syncope.  No hallucinations.  Husband states that yesterday was a really bad day both physically and mentally (more confused and had help getting dressed).  Is much better today.  Appetite is overall down.  Drinking plenty of water.  Not exercising.  Is sleeping a lot per husband.  Husband states that was having back pain.  Went and had rhizotomy and that helped and then PT ordered but husband wants it at neurorehab center as patient very unsteady.  11/24/16 update: Patient seen today, accompanied by her husband to supplements the history.  Patient remains on carbidopa/levodopa 25/100, one tablet at 1 tablet at 9am, 1 PM and 2 tablets at 6 PM.  She takes entacapone 200 mg 3 times per day.  She is on sertraline, 50 mg daily.  She still suffers from rather significant anxiety.  She had one fall but they aren't sure if since last visit but thinks it was.  No hallucinations.  She has been attending outpatient Parkinson's therapies and I have reviewed those records.    04/06/17 update:  Pt seen in f/u for parkinsonism.  This patient is accompanied in the office by her spouse who supplements the history.  On carbidopa/levodopa 25/100, 1/1/2 and comtan 200 mg with each dosage.  Still on zoloft, 50 mg for anxiety, which continues to be problematic.  The records that were made available to me were reviewed.  Completed therapies since our last visit.  Also hospitalized in July for hematochezia with associated significant anemia, requiring transfusion.  EGD was negative.  Bleed was suspected lower GI in nature, likely from diverticular disease.  No falls.  Little off balance.  No hallucinations.  Memory is getting worse.  Some trouble at times knowing how to get in and out of bed.  08/18/17 update: Patient seen today in follow-up for parkinsonism and dementia.  She is accompanied by her husband who supplements history.  She is on carbidopa/levodopa 25/100, 1/1/2 and entacapone with each dosage.  Last visit, we started donepezil.  She has tolerated that medication well.  No diarrhea. She c/o "forgetting a lot."   Remains on sertraline for anxiety.  Her husband called since last visit and stated that occasionally the patient feels that she is choking when she eats.  Modified barium swallow was completed.   It was essentially unremarkable and regular diet with thin liquids was recommended.  Reviewed records since last visit.  She saw oncology.  She remains in remission from her non-Hodgkin's lymphoma and breast cancer.  She is still having back pain and called dr. Jacelyn Grip and states that she is to have "another procedure" to help her back.  Went to [facility] urology and going to start bladder stim.  Asks me about this.  Some trouble sleeping - "I don't want a pill."  01/18/18 update: Patient is seen today in follow-up for Parkinson's disease with associated Parkinson's disease dementia.  She is accompanied by her husband who supplements history.  She is on carbidopa/levodopa 25/100, 1 tablet in the morning, 1 tablet in the afternoon and 2 in the evening.  She takes and compliant with each of these.  Pt denies falls.  Pt denies lightheadedness, near syncope.  No hallucinations.  Mood has been good.  She has seen Dr. Benay Spice since our last visit.  Those records are reviewed.  She remains in remission from her breast cancer and non-Hodgkin's lymphoma.  She is seeing Dr. Ellene Route for her back pain and isn't exercising because of that.  She had an injection for her back pain and is scheduled for another on 02/01/18.  Husband states that he thinks that she is giving up and refusing to do everything.    06/16/18 update: Patient seen today in follow-up for Parkinson's disease with Parkinson's disease dementia, accompanied by her husband who supplements the history.  I have reviewed neurosurgery records that have been made available to me.  She has been seeing them for low back pain.  She is on carbidopa/levodopa 25/100, 1 tablet in the morning, 1 in the afternoon and 2 in the evening.  She is on entacapone with each of these dosages.  She has had no falls since our last visit.  No lightheadedness or near syncope.  She was seen in the emergency room at the end of August with visual hallucinations.  Work-up was negative.  She had  recently been prescribed tramadol for low back pain and it was felt that this medication was likely culprit.  It was recommended that this medication be discontinued.  No longer having hallucinations. She is now on tylenol with codeine but isn't getting it as frequent..   Is on Aricept.  She is having some n/v at nighttime.  More confusion with ADL's.  More trouble getting dressed.    Had PT/ST/OT in the home and she did well with that.  08/12/18 update: Patient seen today in follow-up for Parkinson's disease with Parkinson's disease dementia.  She is accompanied by her husband who supplements history.  She is seen much earlier than expected (her follow-up appointment was scheduled for April). Husband states that she had flu over christmas and got deconditioned and then ended up with several falls.   She follows up because of a fall when walking to the bathroom.  She was seen and evaluated in the emergency room.  Those records have been reviewed.  CT of the brain was negative.  Hip x-rays were negative.  Husband states that she had another fall earlier that day and 1 other fall.  The first fall seemed pretty minor.  Husband is trying to encourage her to use her cane.   In regards to her medications, she is still on carbidopa/levodopa 25/100, 1 tablet in the morning, 1 in the afternoon and 2 in the  evening.  I stopped her entacapone last visit.  I also held her donepezil because of nausea and vomiting.  She is no longer having the nausea.  In regards to hallucinations, her husband states that she will see her sons when they aren't there.  It isn't bothersome and she is re-directable.  Pt cannot answer questions about hallucinations today.  She is on abx and husband states that is helping cough and hallucinations  Neuroimaging has  previously been performed.  An MRI brain was last done in 2010 but pt can no longer have MRI's because of an implantable hearing device.    PREVIOUS MEDICATIONS: donepezil caused  nausea  ALLERGIES:   Allergies  Allergen Reactions  . Azithromycin Other (See Comments)    confusion  . Tramadol Other (See Comments)    Hallucinations    CURRENT MEDICATIONS:  Current Outpatient Medications on File Prior to Visit  Medication Sig Dispense Refill  . acetaminophen (TYLENOL) 325 MG tablet Take 650 mg by mouth every 6 (six) hours as needed for mild pain, moderate pain, fever or headache.     [name] acyclovir (ZOVIRAX) 400 MG tablet TAKE 1 TABLET BY MOUTH TWICE DAILY (Patient taking differently: Take 400 mg by mouth 2 (two) times daily. ) 120 tablet 3  . amoxicillin (AMOXIL) 500 MG capsule     . benzonatate (TESSALON) 200 MG capsule Take 200 mg by mouth 3 (three) times daily as needed for cough.    . Calcium Carbonate-Vitamin D3 (CALCIUM 600-D) 600-400 MG-UNIT TABS Take 1 tablet by mouth at bedtime.    . carbidopa-levodopa (SINEMET IR) 25-100 MG tablet TAKE 1 TABLET BY MOUTH DAILY IN THE MORNING, 1 IN THE AFTERNOON, AND 2 TABLETS IN THE EVENING (Patient taking differently: Take 1-2 tablets by mouth See admin instructions. Take one tablet by mouth daily in the morning, one tablet in the afternoon and two tablets in the evening.) 360 tablet 1  . cetirizine (ZYRTEC) 10 MG tablet Take 10 mg by mouth daily at 12 noon.     . cholecalciferol (VITAMIN D) 1000 units tablet Take 1,000 Units by mouth every evening.     . donepezil (ARICEPT) 10 MG tablet TAKE 1 TABLET BY MOUTH DAILY (Patient not taking: Reported on 08/03/2018) 90 tablet 1  . entacapone (COMTAN) 200 MG tablet TAKE 1 TABLET BY MOUTH 3 TIMES DAILY WITH SINEMET (Patient taking differently: Take 200 mg by mouth 3 (three) times daily. ) 270 tablet 1  . Fluticasone-Salmeterol (ADVAIR) 250-50 MCG/DOSE AEPB Inhale 1 puff into the lungs 2 (two) times daily.    [name] gabapentin (NEURONTIN) 100 MG capsule Take 100 mg by mouth daily.    [name] ipratropium (ATROVENT) 0.03 % nasal spray Place 2 sprays into both nostrils 2 (two) times daily.    [name]  ketoconazole (NIZORAL) 2 % shampoo Apply 1 application topically See admin instructions. 3 times weekly as needed for dry skin.    . mirabegron ER (MYRBETRIQ) 50 MG TB24 tablet Take 50 mg by mouth daily at 12 noon.     . Multiple Vitamins-Minerals (MULTIVITAMIN WITH MINERALS) tablet Take 1 tablet by mouth daily with breakfast.     . ondansetron (ZOFRAN) 8 MG tablet Take 8 mg by mouth 2 (two) times daily.     . pantoprazole (PROTONIX) 40 MG tablet Take 40 mg by mouth daily.     . polyethylene glycol powder (MIRALAX) powder Take 17 g by mouth daily as needed for mild constipation.    . sertraline (  ZOLOFT) 100 MG tablet Take 1 tablet (100 mg total) by mouth daily. 90 tablet 1   No current facility-administered medications on file prior to visit.     PAST MEDICAL HISTORY:   Past Medical History:  Diagnosis Date  . Anxiety   . Asthma   . Depression   . Diabetes mellitus without complication ([place])    10/25/00.Marland KitchenMarland Kitchenpt denies  . GERD (gastroesophageal reflux disease)   . Hearing loss   . NHL (non-Hodgkin's lymphoma) ([place])    nhl dx 9/04 breast ca dx1/12  . Parkinson disease ([place])     PAST SURGICAL HISTORY:   Past Surgical History:  Procedure Laterality Date  . Ba-HA Ear implant    . ESOPHAGOGASTRODUODENOSCOPY N/A 02/04/2017   Procedure: ESOPHAGOGASTRODUODENOSCOPY (EGD);  Surgeon: Arta Silence, MD;  Location: [facility];  Service: Endoscopy;  Laterality: N/A;  . MASTECTOMY  2 /8/ 12   bilateral  . MASTECTOMY      SOCIAL HISTORY:   Social History   Socioeconomic History  . Marital status: Married    Spouse name: Not on file  . Number of children: Not on file  . Years of education: Not on file  . Highest education level: Not on file  Occupational History  . Occupation: retired    Fish farm manager: RETIRED    Comment: [employer]  Social Needs  . Financial resource strain: Not on file  . Food insecurity:    Worry: Not on file    Inability: Not on file  . Transportation needs:     Medical: Not on file    Non-medical: Not on file  Tobacco Use  . Smoking status: Never Smoker  . Smokeless tobacco: Never Used  . Tobacco comment: husband wsa smoker  Substance and Sexual Activity  . Alcohol use: No    Comment: no  . Drug use: No  . Sexual activity: Not on file  Lifestyle  . Physical activity:    Days per week: Not on file    Minutes per session: Not on file  . Stress: Not on file  Relationships  . Social connections:    Talks on phone: Not on file    Gets together: Not on file    Attends religious service: Not on file    Active member of club or organization: Not on file    Attends meetings of clubs or organizations: Not on file    Relationship status: Not on file  . Intimate partner violence:    Fear of current or ex partner: Not on file    Emotionally abused: Not on file    Physically abused: Not on file    Forced sexual activity: Not on file  Other Topics Concern  . Not on file  Social History Narrative  . Not on file    FAMILY HISTORY:   Family Status  Relation Name Status  . Father  Deceased       CA, lung  . Mother  Deceased       CA; complications of splenectomy  . Brother  Deceased       3, renal failure, DM-2; CAD; CA  . Child  Alive       5 (4 biological), one with PKU    ROS:  Review of Systems  Constitutional: Positive for malaise/fatigue.  HENT: Negative.   Eyes: Negative.   Respiratory: Negative.   Cardiovascular: Negative.   Musculoskeletal: Positive for falls.  Skin: Negative.   Psychiatric/Behavioral: Positive for memory loss. The  patient is nervous/anxious.   '  PHYSICAL EXAMINATION:    VITALS:   Vitals:   08/12/18 0937  BP: 112/60  Pulse: 84  SpO2: 95%  Weight: 110 lb (49.9 kg)  Height: 5\' 1"  (1.549 m)   Wt Readings from Last 3 Encounters:  08/12/18 110 lb (49.9 kg)  06/16/18 119 lb (54 kg)  02/28/18 118 lb 8 oz (53.8 kg)     GEN:  The patient appears stated age.   CV:  RRR with 3/6 SEM Lungs:   CTAB Neck:  No bruits but cardiac murmur radiates to the bilateral carotids   Neurological examination:  Orientation: She is alert to person and place [facility] Cognitive Assessment  05/13/2016 09/11/2015 12/14/2014  Visuospatial/ Executive (0/5) 3 3 3   Naming (0/3) 2 3 3   Attention: Read list of digits (0/2) 2 1 2   Attention: Read list of letters (0/1) 1 1 1   Attention: Serial 7 subtraction starting at 100 (0/3) 0 0 0  Language: Repeat phrase (0/2) 1 0 1  Language : Fluency (0/1) 1 1 1   Abstraction (0/2) 1 1 1   Delayed Recall (0/5) 0 0 0  Orientation (0/6) 5 6 6   Total 16 16 18   Adjusted Score (based on education) 17 17 19    Cranial nerves: There is good facial symmetry.  The visual fields are full to confrontational testing. The speech is fluent and clear.  She is hypophonic.  Soft palate rises symmetrically and there is no tongue deviation. Hearing is markedly decreased today even with hearing aid (same as previous) Sensation: Sensation is intact to light outh throughout. Motor: Strength is 5/5 in the bilateral upper and lower extremities.   Shoulder shrug is equal and symmetric.  There is no pronator drift.   Movement examination: Tone: There is no rigidity today Abnormal movements: There is no tremor Coordination:  There is no decremation noted today Gait and Station: The patient pushes off of the chair.  She walks well with her cane  Labs:  Lab Results  Component Value Date   WBC 4.3 03/21/2018   HGB 9.8 (L) 03/21/2018   HCT 28.9 (L) 03/21/2018   MCV 88.7 03/21/2018   PLT 192 03/21/2018     Chemistry      Component Value Date/Time   NA 138 03/21/2018 1710   NA 143 04/13/2017 1155   K 3.9 03/21/2018 1710   K 4.1 04/13/2017 1155   CL 101 03/21/2018 1710   CL 106 12/26/2012 1110   CO2 28 03/21/2018 1710   CO2 27 04/13/2017 1155   BUN 21 03/21/2018 1710   BUN 19.5 04/13/2017 1155   CREATININE 1.01 (H) 03/21/2018 1710   CREATININE 1.18 (H) 01/03/2018 1107    CREATININE 1.2 (H) 04/13/2017 1155      Component Value Date/Time   CALCIUM 8.9 03/21/2018 1710   CALCIUM 9.6 04/13/2017 1155   ALKPHOS 83 03/21/2018 1710   ALKPHOS 97 04/13/2017 1155   AST 23 03/21/2018 1710   AST 20 01/03/2018 1107   AST 20 04/13/2017 1155   ALT 8 03/21/2018 1710   ALT <6 01/03/2018 1107   ALT 7 04/13/2017 1155   BILITOT 0.5 03/21/2018 1710   BILITOT 0.4 01/03/2018 1107   BILITOT 0.31 04/13/2017 1155      Lab Results  Component Value Date   VITAMINB12 1053 (H) 06/10/2006   Lab Results  Component Value Date   TSH 2.93 09/19/2012    ASSESSMENT/PLAN:  1.  Idiopathic Parkinson's disease,  dx 08/2012.    -We discussed the diagnosis as well as pathophysiology of the disease.  We discussed treatment options as well as prognostic indicators.  Patient education was provided.  - continue carbidopa/levodopa 25/100, 1/1/2. She needs to avoid protein when taking levodopa and discussed this.  Risks, benefits, side effects and alternative therapies were discussed.  The opportunity to ask questions was given and they were answered to the best of my ability.  The patient expressed understanding and willingness to follow the outlined treatment protocols.  -Refer for physical therapy/OT/ST in the home due to deconditioning following flu  -use walker at all times.  -safety discussed in detail 2.  Anxiety  -she is still on sertraline 50 mg daily.   3.  Cardiac murmur  -being followed yearly by cardiology for mod AS 4.  PDD, advanced  -stay off of donepezil.  Caused n/v  -has 24 hour per day care and needs that 5. Urinary incontinence  -seeing [facility]. 6.  Follow up is anticipated in the next few months, sooner should new neurologic issues arise.  Much greater than 50% of this visit was spent in counseling and coordinating care.  Total face to face time:  25 min

## 2018-08-12 ENCOUNTER — Ambulatory Visit (INDEPENDENT_AMBULATORY_CARE_PROVIDER_SITE_OTHER): Payer: Medicare Other | Admitting: Neurology

## 2018-08-12 ENCOUNTER — Encounter: Payer: Self-pay | Admitting: Neurology

## 2018-08-12 VITALS — BP 112/60 | HR 84 | Ht 61.0 in | Wt 110.0 lb

## 2018-08-12 DIAGNOSIS — G20 Parkinson's disease: Secondary | ICD-10-CM | POA: Diagnosis not present

## 2018-08-12 DIAGNOSIS — F0280 Dementia in other diseases classified elsewhere without behavioral disturbance: Secondary | ICD-10-CM | POA: Diagnosis not present

## 2018-08-12 DIAGNOSIS — G20A1 Parkinson's disease without dyskinesia, without mention of fluctuations: Secondary | ICD-10-CM

## 2018-08-12 NOTE — Addendum Note (Signed)
Addended byMargarette Asal L on: 08/12/2018 10:20 AM   Modules accepted: Orders

## 2018-08-12 NOTE — Patient Instructions (Signed)
We will send home health to you. They will contact you directly to schedule a good time to start services.

## 2018-08-15 ENCOUNTER — Other Ambulatory Visit: Payer: Self-pay | Admitting: Neurology

## 2018-08-17 DIAGNOSIS — R296 Repeated falls: Secondary | ICD-10-CM | POA: Diagnosis not present

## 2018-08-17 DIAGNOSIS — F329 Major depressive disorder, single episode, unspecified: Secondary | ICD-10-CM | POA: Diagnosis not present

## 2018-08-17 DIAGNOSIS — R1312 Dysphagia, oropharyngeal phase: Secondary | ICD-10-CM | POA: Diagnosis not present

## 2018-08-17 DIAGNOSIS — F0280 Dementia in other diseases classified elsewhere without behavioral disturbance: Secondary | ICD-10-CM | POA: Diagnosis not present

## 2018-08-17 DIAGNOSIS — F419 Anxiety disorder, unspecified: Secondary | ICD-10-CM | POA: Diagnosis not present

## 2018-08-17 DIAGNOSIS — E119 Type 2 diabetes mellitus without complications: Secondary | ICD-10-CM | POA: Diagnosis not present

## 2018-08-17 DIAGNOSIS — I10 Essential (primary) hypertension: Secondary | ICD-10-CM | POA: Diagnosis not present

## 2018-08-17 DIAGNOSIS — G20 Parkinson's disease: Secondary | ICD-10-CM | POA: Diagnosis not present

## 2018-08-17 DIAGNOSIS — Z9181 History of falling: Secondary | ICD-10-CM | POA: Diagnosis not present

## 2018-08-17 DIAGNOSIS — M545 Low back pain: Secondary | ICD-10-CM | POA: Diagnosis not present

## 2018-08-17 DIAGNOSIS — J45909 Unspecified asthma, uncomplicated: Secondary | ICD-10-CM | POA: Diagnosis not present

## 2018-08-17 DIAGNOSIS — G8929 Other chronic pain: Secondary | ICD-10-CM | POA: Diagnosis not present

## 2018-08-18 DIAGNOSIS — R1312 Dysphagia, oropharyngeal phase: Secondary | ICD-10-CM | POA: Diagnosis not present

## 2018-08-18 DIAGNOSIS — G8929 Other chronic pain: Secondary | ICD-10-CM | POA: Diagnosis not present

## 2018-08-18 DIAGNOSIS — M545 Low back pain: Secondary | ICD-10-CM | POA: Diagnosis not present

## 2018-08-18 DIAGNOSIS — G20 Parkinson's disease: Secondary | ICD-10-CM | POA: Diagnosis not present

## 2018-08-18 DIAGNOSIS — E119 Type 2 diabetes mellitus without complications: Secondary | ICD-10-CM | POA: Diagnosis not present

## 2018-08-18 DIAGNOSIS — F0280 Dementia in other diseases classified elsewhere without behavioral disturbance: Secondary | ICD-10-CM | POA: Diagnosis not present

## 2018-08-23 DIAGNOSIS — F0280 Dementia in other diseases classified elsewhere without behavioral disturbance: Secondary | ICD-10-CM | POA: Diagnosis not present

## 2018-08-23 DIAGNOSIS — G8929 Other chronic pain: Secondary | ICD-10-CM | POA: Diagnosis not present

## 2018-08-23 DIAGNOSIS — E119 Type 2 diabetes mellitus without complications: Secondary | ICD-10-CM | POA: Diagnosis not present

## 2018-08-23 DIAGNOSIS — M545 Low back pain: Secondary | ICD-10-CM | POA: Diagnosis not present

## 2018-08-23 DIAGNOSIS — G20 Parkinson's disease: Secondary | ICD-10-CM | POA: Diagnosis not present

## 2018-08-23 DIAGNOSIS — R1312 Dysphagia, oropharyngeal phase: Secondary | ICD-10-CM | POA: Diagnosis not present

## 2018-08-24 DIAGNOSIS — R1312 Dysphagia, oropharyngeal phase: Secondary | ICD-10-CM | POA: Diagnosis not present

## 2018-08-24 DIAGNOSIS — G8929 Other chronic pain: Secondary | ICD-10-CM | POA: Diagnosis not present

## 2018-08-24 DIAGNOSIS — F0280 Dementia in other diseases classified elsewhere without behavioral disturbance: Secondary | ICD-10-CM | POA: Diagnosis not present

## 2018-08-24 DIAGNOSIS — E119 Type 2 diabetes mellitus without complications: Secondary | ICD-10-CM | POA: Diagnosis not present

## 2018-08-24 DIAGNOSIS — M545 Low back pain: Secondary | ICD-10-CM | POA: Diagnosis not present

## 2018-08-24 DIAGNOSIS — G20 Parkinson's disease: Secondary | ICD-10-CM | POA: Diagnosis not present

## 2018-08-25 DIAGNOSIS — R1312 Dysphagia, oropharyngeal phase: Secondary | ICD-10-CM | POA: Diagnosis not present

## 2018-08-25 DIAGNOSIS — E119 Type 2 diabetes mellitus without complications: Secondary | ICD-10-CM | POA: Diagnosis not present

## 2018-08-25 DIAGNOSIS — G8929 Other chronic pain: Secondary | ICD-10-CM | POA: Diagnosis not present

## 2018-08-25 DIAGNOSIS — M545 Low back pain: Secondary | ICD-10-CM | POA: Diagnosis not present

## 2018-08-25 DIAGNOSIS — G20 Parkinson's disease: Secondary | ICD-10-CM | POA: Diagnosis not present

## 2018-08-25 DIAGNOSIS — F0280 Dementia in other diseases classified elsewhere without behavioral disturbance: Secondary | ICD-10-CM | POA: Diagnosis not present

## 2018-08-26 ENCOUNTER — Telehealth: Payer: Self-pay | Admitting: Neurology

## 2018-08-26 DIAGNOSIS — E119 Type 2 diabetes mellitus without complications: Secondary | ICD-10-CM | POA: Diagnosis not present

## 2018-08-26 DIAGNOSIS — G20 Parkinson's disease: Secondary | ICD-10-CM | POA: Diagnosis not present

## 2018-08-26 DIAGNOSIS — F0280 Dementia in other diseases classified elsewhere without behavioral disturbance: Secondary | ICD-10-CM | POA: Diagnosis not present

## 2018-08-26 DIAGNOSIS — R1312 Dysphagia, oropharyngeal phase: Secondary | ICD-10-CM | POA: Diagnosis not present

## 2018-08-26 DIAGNOSIS — M545 Low back pain: Secondary | ICD-10-CM | POA: Diagnosis not present

## 2018-08-26 DIAGNOSIS — G8929 Other chronic pain: Secondary | ICD-10-CM | POA: Diagnosis not present

## 2018-08-26 NOTE — Telephone Encounter (Signed)
She has fairly advanced dementia and at this point, there really are not more meds I would recommend.

## 2018-08-26 NOTE — Telephone Encounter (Signed)
Spoke with pt's husband, [name], who states that pt is getting worse.  She is losing more of her memory.  Asks why her memory medication was d/c'd - I advised it was due to pt experiencing N/V. [name] states that pt is now sleeping most of the day - will sleep 24 hours if he allows it.  [name] goes on to Thank Dr. [name] for therapist referral.  Pt has started therapy. I advised that I would send message to Dr. [name] and return call with any recommendations.

## 2018-08-26 NOTE — Telephone Encounter (Signed)
Spoke with pt's husband.  He states that he understand and did not expect that there was anything Dr. [name] could do.  He appreciated everything Dr. [name] has done for pt and states that it is "just incredibly hard seeing her like this.  I do not know how much longer I have with her before this disease takes her from me".

## 2018-08-26 NOTE — Telephone Encounter (Signed)
[name] (Spouse) called regarding his Wife and he is very concerned about her present condition. She has a Transport planner there at their home today. He is very concerned. Please Call. Thanks

## 2018-08-29 DIAGNOSIS — G20 Parkinson's disease: Secondary | ICD-10-CM | POA: Diagnosis not present

## 2018-08-29 DIAGNOSIS — F0280 Dementia in other diseases classified elsewhere without behavioral disturbance: Secondary | ICD-10-CM | POA: Diagnosis not present

## 2018-08-29 DIAGNOSIS — G8929 Other chronic pain: Secondary | ICD-10-CM | POA: Diagnosis not present

## 2018-08-29 DIAGNOSIS — E119 Type 2 diabetes mellitus without complications: Secondary | ICD-10-CM | POA: Diagnosis not present

## 2018-08-29 DIAGNOSIS — R1312 Dysphagia, oropharyngeal phase: Secondary | ICD-10-CM | POA: Diagnosis not present

## 2018-08-29 DIAGNOSIS — M545 Low back pain: Secondary | ICD-10-CM | POA: Diagnosis not present

## 2018-08-30 ENCOUNTER — Inpatient Hospital Stay: Payer: Medicare Other | Attending: Oncology | Admitting: Oncology

## 2018-08-30 ENCOUNTER — Telehealth: Payer: Self-pay | Admitting: Oncology

## 2018-08-30 ENCOUNTER — Inpatient Hospital Stay: Payer: Medicare Other

## 2018-08-30 VITALS — BP 124/61 | HR 71 | Temp 97.6°F | Resp 18 | Ht 61.0 in | Wt 109.5 lb

## 2018-08-30 DIAGNOSIS — C8590 Non-Hodgkin lymphoma, unspecified, unspecified site: Secondary | ICD-10-CM | POA: Diagnosis not present

## 2018-08-30 DIAGNOSIS — F0280 Dementia in other diseases classified elsewhere without behavioral disturbance: Secondary | ICD-10-CM | POA: Diagnosis not present

## 2018-08-30 DIAGNOSIS — Z170 Estrogen receptor positive status [ER+]: Secondary | ICD-10-CM | POA: Diagnosis not present

## 2018-08-30 DIAGNOSIS — Z9221 Personal history of antineoplastic chemotherapy: Secondary | ICD-10-CM

## 2018-08-30 DIAGNOSIS — D649 Anemia, unspecified: Secondary | ICD-10-CM | POA: Insufficient documentation

## 2018-08-30 DIAGNOSIS — Z79899 Other long term (current) drug therapy: Secondary | ICD-10-CM | POA: Diagnosis not present

## 2018-08-30 DIAGNOSIS — G20 Parkinson's disease: Secondary | ICD-10-CM | POA: Insufficient documentation

## 2018-08-30 DIAGNOSIS — Z853 Personal history of malignant neoplasm of breast: Secondary | ICD-10-CM | POA: Insufficient documentation

## 2018-08-30 DIAGNOSIS — I10 Essential (primary) hypertension: Secondary | ICD-10-CM | POA: Diagnosis not present

## 2018-08-30 DIAGNOSIS — B029 Zoster without complications: Secondary | ICD-10-CM | POA: Insufficient documentation

## 2018-08-30 LAB — CBC WITH DIFFERENTIAL (CANCER CENTER ONLY)
ABS IMMATURE GRANULOCYTES: 0.08 10*3/uL — AB (ref 0.00–0.07)
Basophils Absolute: 0 10*3/uL (ref 0.0–0.1)
Basophils Relative: 1 %
Eosinophils Absolute: 0.1 10*3/uL (ref 0.0–0.5)
Eosinophils Relative: 1 %
HCT: 29 % — ABNORMAL LOW (ref 36.0–46.0)
Hemoglobin: 9.3 g/dL — ABNORMAL LOW (ref 12.0–15.0)
Immature Granulocytes: 1 %
LYMPHS ABS: 0.9 10*3/uL (ref 0.7–4.0)
Lymphocytes Relative: 13 %
MCH: 27.1 pg (ref 26.0–34.0)
MCHC: 32.1 g/dL (ref 30.0–36.0)
MCV: 84.5 fL (ref 80.0–100.0)
Monocytes Absolute: 0.4 10*3/uL (ref 0.1–1.0)
Monocytes Relative: 6 %
NEUTROS ABS: 5 10*3/uL (ref 1.7–7.7)
NRBC: 0 % (ref 0.0–0.2)
Neutrophils Relative %: 78 %
Platelet Count: 248 10*3/uL (ref 150–400)
RBC: 3.43 MIL/uL — ABNORMAL LOW (ref 3.87–5.11)
RDW: 13 % (ref 11.5–15.5)
WBC Count: 6.5 10*3/uL (ref 4.0–10.5)

## 2018-08-30 LAB — CMP (CANCER CENTER ONLY)
ALT: <6 U/L (ref 0–44)
AST: 15 U/L (ref 15–41)
Albumin: 3 g/dL — ABNORMAL LOW (ref 3.5–5.0)
Alkaline Phosphatase: 122 U/L (ref 38–126)
Anion gap: 8 (ref 5–15)
BILIRUBIN TOTAL: 0.5 mg/dL (ref 0.3–1.2)
BUN: 10 mg/dL (ref 8–23)
CO2: 27 mmol/L (ref 22–32)
Calcium: 9.3 mg/dL (ref 8.9–10.3)
Chloride: 105 mmol/L (ref 98–111)
Creatinine: 0.95 mg/dL (ref 0.44–1.00)
GFR, Est AFR Am: >60 mL/min (ref >60)
GFR, Estimated: 53 mL/min — ABNORMAL LOW (ref >60)
Glucose, Bld: 98 mg/dL (ref 70–99)
Potassium: 4.1 mmol/L (ref 3.5–5.1)
Sodium: 140 mmol/L (ref 135–145)
TOTAL PROTEIN: 6.6 g/dL (ref 6.5–8.1)

## 2018-08-30 LAB — RETICULOCYTES
Immature Retic Fract: 15.2 % (ref 2.3–15.9)
RBC.: 3.44 MIL/uL — ABNORMAL LOW (ref 3.87–5.11)
Retic Count, Absolute: 66.7 10*3/uL (ref 19.0–186.0)
Retic Ct Pct: 2 % (ref 0.4–3.1)

## 2018-08-30 LAB — SAVE SMEAR(SSMR), FOR PROVIDER SLIDE REVIEW

## 2018-08-30 NOTE — Progress Notes (Signed)
Per lab: not able to run ferritin or LDH off sample collected today. Added to her labs on 09/21/18

## 2018-08-30 NOTE — Progress Notes (Signed)
[place] OFFICE PROGRESS NOTE   Diagnosis: Non-Hodgkin's lymphoma, breast cancer  INTERVAL HISTORY:   [name] returns for a scheduled visit.  She is accompanied by her husband.  He reports her clinical status has declined over recent months secondary to progression of Parkinson's disease and memory loss.  She has a poor appetite.  She has an increased cough, but no dyspnea.  No bleeding.  Objective:  Vital signs in last 24 hours:  Blood pressure 124/61, pulse 71, temperature 97.6 F (36.4 C), temperature source Oral, resp. rate 18, height 5' 1" (1.549 m), weight 109 lb 8 oz (49.7 kg), SpO2 99 %.    HEENT: Neck without mass Lymphatics: No cervical, supraclavicular, axillary, or inguinal nodes Resp: Decreased breath sounds at the right lower posterior chest, no respiratory distress Cardio: Regular rhythm, 2/6 systolic murmur GI: No mass, no hepatosplenomegaly Vascular: No leg edema Neuro: Alert, follows commands Breast: Status post bilateral mastectomy.  No evidence for chest wall tumor recurrence   Lab Results:  Lab Results  Component Value Date   WBC 6.5 08/30/2018   HGB 9.3 (L) 08/30/2018   HCT 29.0 (L) 08/30/2018   MCV 84.5 08/30/2018   PLT 248 08/30/2018   NEUTROABS 5.0 08/30/2018    CMP  Lab Results  Component Value Date   NA 140 08/30/2018   K 4.1 08/30/2018   CL 105 08/30/2018   CO2 27 08/30/2018   GLUCOSE 98 08/30/2018   BUN 10 08/30/2018   CREATININE 0.95 08/30/2018   CALCIUM 9.3 08/30/2018   PROT 6.6 08/30/2018   ALBUMIN 3.0 (L) 08/30/2018   AST 15 08/30/2018   ALT <6 08/30/2018   ALKPHOS 122 08/30/2018   BILITOT 0.5 08/30/2018   GFRNONAA 53 (L) 08/30/2018   GFRAA >60 08/30/2018     Medications: I have reviewed the patient's current medications.   Assessment/Plan: 1. Non-Hodgkin lymphoma treated with fludarabine/rituximab, last in July of 2009.  1. Restaging CT 11/24/2010 revealed evidence of progressive lymphoma  in the abdomen and pelvis.  2. Initiation of salvage therapy with bendamustine/rituximab 12/04/2010. She completed 3 cycles.  3. Restaging CT 03/03/2011 revealed stable retroperitoneal soft tissue and a marked decrease in the soft tissue thickening associated with dilated loops of small bowel. 2. History of ITP.  3. History of Herpes zoster-maintained on prophylactic acyclovir. 4. History of anemia secondary to non-Hodgkin lymphoma, chemotherapy, and a history of autoimmune hemolysis.  5. Hypertension. 6. Right-hand tremor/ataxia-followed by neurology, now taking Sinemet, diagnosed with Parkinson's disease 7. Hearing loss-status post placement of an implanted hearing device by Dr. Cresenciano Lick. 8. Bilateral breast cancer, right breast cancer-grade 1, T1 N0, ER/PR positive, and HER2 negative. Left breast cancer, synchronous grade 2, T2 N0, ER/PR positive, and HER2 negative. She began adjuvant Arimidex on 09/15/2010,completed February 2017 9. Inflammatory changes of both lungs on a CT of the chest 11/24/2010-progressive on the restaging CT 03/03/2011, status post a bronchoscopy 03/17/2011 with no evidence of granulomata or tumor. 10. 11 mm spiculated lesion in the lingula on a CT 11/24/2010-less distinct on the CT 03/03/2011. 11. Upper respiratory infection while visiting her son in [place], June 2013, resolved after treatment with Levaquin/steroids 12. Upper respiratory infection,? Pneumonia summer 2014-chest x-ray 02/17/2013 with bilateral infiltrates 13. CT chest abdomen and pelvis on 11/29/2014 with no evidence of active lymphoma or metastatic breast cancer. Previously demonstrated retroperitoneal process appeared improved with probable residual fibrosis. No discrete adenopathy. Interval near complete resolution of patchy airspace opacities in both lungs.  14. Mild swelling and pain at the right lower leg 08/06/2016-negative for acute deep vein thrombosis, possible chronic thrombus in the right  popliteal vein 15. Admission July 2018 with GI bleeding, no source for blood loss found on an upper endoscopy 16. Parkinson's disease 17. Dementia     Disposition: [name] has a declining performance status.  This may be related to Parkinson's and dementia.  I see no clear evidence of a recurrent malignancy.  She is more anemic today.  She has a history of anemia secondary to hemolysis and bleeding.  Her husband reports she has no current bleeding.  We will check a reticulocyte count today.  She will return for an office visit and repeat CBC in 2-3 weeks.  We will review the peripheral blood smear and investigate the anemia further if the hemoglobin falls.  We may decide against aggressive interventions given her level of dementia.  Her husband is in agreement with this plan.  Betsy Coder, MD  08/30/2018  1:59 PM

## 2018-08-30 NOTE — Telephone Encounter (Signed)
Scheduled appt per 02/04 los. ° °Printed calendar and avs. °

## 2018-08-31 DIAGNOSIS — R1312 Dysphagia, oropharyngeal phase: Secondary | ICD-10-CM | POA: Diagnosis not present

## 2018-08-31 DIAGNOSIS — F0280 Dementia in other diseases classified elsewhere without behavioral disturbance: Secondary | ICD-10-CM | POA: Diagnosis not present

## 2018-08-31 DIAGNOSIS — G8929 Other chronic pain: Secondary | ICD-10-CM | POA: Diagnosis not present

## 2018-08-31 DIAGNOSIS — E119 Type 2 diabetes mellitus without complications: Secondary | ICD-10-CM | POA: Diagnosis not present

## 2018-08-31 DIAGNOSIS — M545 Low back pain: Secondary | ICD-10-CM | POA: Diagnosis not present

## 2018-08-31 DIAGNOSIS — G20 Parkinson's disease: Secondary | ICD-10-CM | POA: Diagnosis not present

## 2018-09-01 DIAGNOSIS — E119 Type 2 diabetes mellitus without complications: Secondary | ICD-10-CM | POA: Diagnosis not present

## 2018-09-01 DIAGNOSIS — F0280 Dementia in other diseases classified elsewhere without behavioral disturbance: Secondary | ICD-10-CM | POA: Diagnosis not present

## 2018-09-01 DIAGNOSIS — G20 Parkinson's disease: Secondary | ICD-10-CM | POA: Diagnosis not present

## 2018-09-01 DIAGNOSIS — G8929 Other chronic pain: Secondary | ICD-10-CM | POA: Diagnosis not present

## 2018-09-01 DIAGNOSIS — M545 Low back pain: Secondary | ICD-10-CM | POA: Diagnosis not present

## 2018-09-01 DIAGNOSIS — R1312 Dysphagia, oropharyngeal phase: Secondary | ICD-10-CM | POA: Diagnosis not present

## 2018-09-02 DIAGNOSIS — F419 Anxiety disorder, unspecified: Secondary | ICD-10-CM | POA: Diagnosis not present

## 2018-09-02 DIAGNOSIS — I10 Essential (primary) hypertension: Secondary | ICD-10-CM | POA: Diagnosis not present

## 2018-09-02 DIAGNOSIS — K219 Gastro-esophageal reflux disease without esophagitis: Secondary | ICD-10-CM | POA: Diagnosis not present

## 2018-09-02 DIAGNOSIS — N183 Chronic kidney disease, stage 3 (moderate): Secondary | ICD-10-CM | POA: Diagnosis not present

## 2018-09-02 DIAGNOSIS — G20 Parkinson's disease: Secondary | ICD-10-CM | POA: Diagnosis not present

## 2018-09-02 DIAGNOSIS — D500 Iron deficiency anemia secondary to blood loss (chronic): Secondary | ICD-10-CM | POA: Diagnosis not present

## 2018-09-02 DIAGNOSIS — G8929 Other chronic pain: Secondary | ICD-10-CM | POA: Diagnosis not present

## 2018-09-02 DIAGNOSIS — E7800 Pure hypercholesterolemia, unspecified: Secondary | ICD-10-CM | POA: Diagnosis not present

## 2018-09-02 DIAGNOSIS — R05 Cough: Secondary | ICD-10-CM | POA: Diagnosis not present

## 2018-09-05 DIAGNOSIS — G8929 Other chronic pain: Secondary | ICD-10-CM | POA: Diagnosis not present

## 2018-09-05 DIAGNOSIS — E119 Type 2 diabetes mellitus without complications: Secondary | ICD-10-CM | POA: Diagnosis not present

## 2018-09-05 DIAGNOSIS — M545 Low back pain: Secondary | ICD-10-CM | POA: Diagnosis not present

## 2018-09-05 DIAGNOSIS — F0280 Dementia in other diseases classified elsewhere without behavioral disturbance: Secondary | ICD-10-CM | POA: Diagnosis not present

## 2018-09-05 DIAGNOSIS — R1312 Dysphagia, oropharyngeal phase: Secondary | ICD-10-CM | POA: Diagnosis not present

## 2018-09-05 DIAGNOSIS — G20 Parkinson's disease: Secondary | ICD-10-CM | POA: Diagnosis not present

## 2018-09-06 DIAGNOSIS — G20 Parkinson's disease: Secondary | ICD-10-CM | POA: Diagnosis not present

## 2018-09-06 DIAGNOSIS — E119 Type 2 diabetes mellitus without complications: Secondary | ICD-10-CM | POA: Diagnosis not present

## 2018-09-06 DIAGNOSIS — G8929 Other chronic pain: Secondary | ICD-10-CM | POA: Diagnosis not present

## 2018-09-06 DIAGNOSIS — R1312 Dysphagia, oropharyngeal phase: Secondary | ICD-10-CM | POA: Diagnosis not present

## 2018-09-06 DIAGNOSIS — F0280 Dementia in other diseases classified elsewhere without behavioral disturbance: Secondary | ICD-10-CM | POA: Diagnosis not present

## 2018-09-06 DIAGNOSIS — M545 Low back pain: Secondary | ICD-10-CM | POA: Diagnosis not present

## 2018-09-07 DIAGNOSIS — G8929 Other chronic pain: Secondary | ICD-10-CM | POA: Diagnosis not present

## 2018-09-07 DIAGNOSIS — G20 Parkinson's disease: Secondary | ICD-10-CM | POA: Diagnosis not present

## 2018-09-07 DIAGNOSIS — E119 Type 2 diabetes mellitus without complications: Secondary | ICD-10-CM | POA: Diagnosis not present

## 2018-09-07 DIAGNOSIS — M545 Low back pain: Secondary | ICD-10-CM | POA: Diagnosis not present

## 2018-09-07 DIAGNOSIS — R1312 Dysphagia, oropharyngeal phase: Secondary | ICD-10-CM | POA: Diagnosis not present

## 2018-09-07 DIAGNOSIS — F0280 Dementia in other diseases classified elsewhere without behavioral disturbance: Secondary | ICD-10-CM | POA: Diagnosis not present

## 2018-09-08 ENCOUNTER — Telehealth: Payer: Self-pay | Admitting: Neurology

## 2018-09-08 DIAGNOSIS — M545 Low back pain: Secondary | ICD-10-CM | POA: Diagnosis not present

## 2018-09-08 DIAGNOSIS — E119 Type 2 diabetes mellitus without complications: Secondary | ICD-10-CM | POA: Diagnosis not present

## 2018-09-08 DIAGNOSIS — G8929 Other chronic pain: Secondary | ICD-10-CM | POA: Diagnosis not present

## 2018-09-08 DIAGNOSIS — F0280 Dementia in other diseases classified elsewhere without behavioral disturbance: Secondary | ICD-10-CM | POA: Diagnosis not present

## 2018-09-08 DIAGNOSIS — R1312 Dysphagia, oropharyngeal phase: Secondary | ICD-10-CM | POA: Diagnosis not present

## 2018-09-08 DIAGNOSIS — G20 Parkinson's disease: Secondary | ICD-10-CM | POA: Diagnosis not present

## 2018-09-08 NOTE — Telephone Encounter (Signed)
Gave verbal order to extend PT for one week.

## 2018-09-09 DIAGNOSIS — G20 Parkinson's disease: Secondary | ICD-10-CM | POA: Diagnosis not present

## 2018-09-09 DIAGNOSIS — F0280 Dementia in other diseases classified elsewhere without behavioral disturbance: Secondary | ICD-10-CM | POA: Diagnosis not present

## 2018-09-09 DIAGNOSIS — M545 Low back pain: Secondary | ICD-10-CM | POA: Diagnosis not present

## 2018-09-09 DIAGNOSIS — R1312 Dysphagia, oropharyngeal phase: Secondary | ICD-10-CM | POA: Diagnosis not present

## 2018-09-09 DIAGNOSIS — E119 Type 2 diabetes mellitus without complications: Secondary | ICD-10-CM | POA: Diagnosis not present

## 2018-09-09 DIAGNOSIS — G8929 Other chronic pain: Secondary | ICD-10-CM | POA: Diagnosis not present

## 2018-09-12 DIAGNOSIS — M545 Low back pain: Secondary | ICD-10-CM | POA: Diagnosis not present

## 2018-09-12 DIAGNOSIS — G20 Parkinson's disease: Secondary | ICD-10-CM | POA: Diagnosis not present

## 2018-09-12 DIAGNOSIS — F0280 Dementia in other diseases classified elsewhere without behavioral disturbance: Secondary | ICD-10-CM | POA: Diagnosis not present

## 2018-09-12 DIAGNOSIS — G8929 Other chronic pain: Secondary | ICD-10-CM | POA: Diagnosis not present

## 2018-09-12 DIAGNOSIS — E119 Type 2 diabetes mellitus without complications: Secondary | ICD-10-CM | POA: Diagnosis not present

## 2018-09-12 DIAGNOSIS — R1312 Dysphagia, oropharyngeal phase: Secondary | ICD-10-CM | POA: Diagnosis not present

## 2018-09-13 DIAGNOSIS — F0280 Dementia in other diseases classified elsewhere without behavioral disturbance: Secondary | ICD-10-CM | POA: Diagnosis not present

## 2018-09-13 DIAGNOSIS — M545 Low back pain: Secondary | ICD-10-CM | POA: Diagnosis not present

## 2018-09-13 DIAGNOSIS — E119 Type 2 diabetes mellitus without complications: Secondary | ICD-10-CM | POA: Diagnosis not present

## 2018-09-13 DIAGNOSIS — R1312 Dysphagia, oropharyngeal phase: Secondary | ICD-10-CM | POA: Diagnosis not present

## 2018-09-13 DIAGNOSIS — G20 Parkinson's disease: Secondary | ICD-10-CM | POA: Diagnosis not present

## 2018-09-13 DIAGNOSIS — G8929 Other chronic pain: Secondary | ICD-10-CM | POA: Diagnosis not present

## 2018-09-14 DIAGNOSIS — F0280 Dementia in other diseases classified elsewhere without behavioral disturbance: Secondary | ICD-10-CM | POA: Diagnosis not present

## 2018-09-14 DIAGNOSIS — R1312 Dysphagia, oropharyngeal phase: Secondary | ICD-10-CM | POA: Diagnosis not present

## 2018-09-14 DIAGNOSIS — G20 Parkinson's disease: Secondary | ICD-10-CM | POA: Diagnosis not present

## 2018-09-14 DIAGNOSIS — G8929 Other chronic pain: Secondary | ICD-10-CM | POA: Diagnosis not present

## 2018-09-14 DIAGNOSIS — E119 Type 2 diabetes mellitus without complications: Secondary | ICD-10-CM | POA: Diagnosis not present

## 2018-09-14 DIAGNOSIS — M545 Low back pain: Secondary | ICD-10-CM | POA: Diagnosis not present

## 2018-09-15 ENCOUNTER — Telehealth: Payer: Self-pay | Admitting: Neurology

## 2018-09-15 DIAGNOSIS — G20 Parkinson's disease: Secondary | ICD-10-CM | POA: Diagnosis not present

## 2018-09-15 DIAGNOSIS — M545 Low back pain: Secondary | ICD-10-CM | POA: Diagnosis not present

## 2018-09-15 DIAGNOSIS — R1312 Dysphagia, oropharyngeal phase: Secondary | ICD-10-CM | POA: Diagnosis not present

## 2018-09-15 DIAGNOSIS — F0280 Dementia in other diseases classified elsewhere without behavioral disturbance: Secondary | ICD-10-CM | POA: Diagnosis not present

## 2018-09-15 DIAGNOSIS — E119 Type 2 diabetes mellitus without complications: Secondary | ICD-10-CM | POA: Diagnosis not present

## 2018-09-15 DIAGNOSIS — G8929 Other chronic pain: Secondary | ICD-10-CM | POA: Diagnosis not present

## 2018-09-15 NOTE — Telephone Encounter (Signed)
Received note from afterhours nursing stating: "Caller states [name] @ [facility] [phone] - need orders for a patient to receive PT for 2 times a week for 4 weeks. Please contact caller to approve an order for the patient to have PT 2 times a week for 4 weeks."   Called back and gave verbal orders.

## 2018-09-16 DIAGNOSIS — Z9181 History of falling: Secondary | ICD-10-CM | POA: Diagnosis not present

## 2018-09-16 DIAGNOSIS — I10 Essential (primary) hypertension: Secondary | ICD-10-CM | POA: Diagnosis not present

## 2018-09-16 DIAGNOSIS — F419 Anxiety disorder, unspecified: Secondary | ICD-10-CM | POA: Diagnosis not present

## 2018-09-16 DIAGNOSIS — R296 Repeated falls: Secondary | ICD-10-CM | POA: Diagnosis not present

## 2018-09-16 DIAGNOSIS — E119 Type 2 diabetes mellitus without complications: Secondary | ICD-10-CM | POA: Diagnosis not present

## 2018-09-16 DIAGNOSIS — F329 Major depressive disorder, single episode, unspecified: Secondary | ICD-10-CM | POA: Diagnosis not present

## 2018-09-16 DIAGNOSIS — M545 Low back pain: Secondary | ICD-10-CM | POA: Diagnosis not present

## 2018-09-16 DIAGNOSIS — J45909 Unspecified asthma, uncomplicated: Secondary | ICD-10-CM | POA: Diagnosis not present

## 2018-09-16 DIAGNOSIS — G20 Parkinson's disease: Secondary | ICD-10-CM | POA: Diagnosis not present

## 2018-09-16 DIAGNOSIS — R1312 Dysphagia, oropharyngeal phase: Secondary | ICD-10-CM | POA: Diagnosis not present

## 2018-09-16 DIAGNOSIS — F0280 Dementia in other diseases classified elsewhere without behavioral disturbance: Secondary | ICD-10-CM | POA: Diagnosis not present

## 2018-09-16 DIAGNOSIS — G8929 Other chronic pain: Secondary | ICD-10-CM | POA: Diagnosis not present

## 2018-09-20 DIAGNOSIS — G20 Parkinson's disease: Secondary | ICD-10-CM | POA: Diagnosis not present

## 2018-09-20 DIAGNOSIS — F0280 Dementia in other diseases classified elsewhere without behavioral disturbance: Secondary | ICD-10-CM | POA: Diagnosis not present

## 2018-09-20 DIAGNOSIS — R1312 Dysphagia, oropharyngeal phase: Secondary | ICD-10-CM | POA: Diagnosis not present

## 2018-09-20 DIAGNOSIS — E119 Type 2 diabetes mellitus without complications: Secondary | ICD-10-CM | POA: Diagnosis not present

## 2018-09-20 DIAGNOSIS — M545 Low back pain: Secondary | ICD-10-CM | POA: Diagnosis not present

## 2018-09-20 DIAGNOSIS — G8929 Other chronic pain: Secondary | ICD-10-CM | POA: Diagnosis not present

## 2018-09-21 ENCOUNTER — Inpatient Hospital Stay (HOSPITAL_BASED_OUTPATIENT_CLINIC_OR_DEPARTMENT_OTHER): Payer: Medicare Other | Admitting: Nurse Practitioner

## 2018-09-21 ENCOUNTER — Telehealth: Payer: Self-pay | Admitting: Nurse Practitioner

## 2018-09-21 ENCOUNTER — Inpatient Hospital Stay: Payer: Medicare Other

## 2018-09-21 ENCOUNTER — Encounter: Payer: Self-pay | Admitting: Nurse Practitioner

## 2018-09-21 VITALS — BP 158/79 | HR 85 | Temp 97.8°F | Resp 18 | Ht 61.0 in | Wt 110.0 lb

## 2018-09-21 DIAGNOSIS — C8590 Non-Hodgkin lymphoma, unspecified, unspecified site: Secondary | ICD-10-CM

## 2018-09-21 DIAGNOSIS — Z170 Estrogen receptor positive status [ER+]: Secondary | ICD-10-CM | POA: Diagnosis not present

## 2018-09-21 DIAGNOSIS — D649 Anemia, unspecified: Secondary | ICD-10-CM | POA: Diagnosis not present

## 2018-09-21 DIAGNOSIS — F0280 Dementia in other diseases classified elsewhere without behavioral disturbance: Secondary | ICD-10-CM

## 2018-09-21 DIAGNOSIS — Z853 Personal history of malignant neoplasm of breast: Secondary | ICD-10-CM

## 2018-09-21 DIAGNOSIS — I10 Essential (primary) hypertension: Secondary | ICD-10-CM | POA: Diagnosis not present

## 2018-09-21 DIAGNOSIS — G20 Parkinson's disease: Secondary | ICD-10-CM

## 2018-09-21 DIAGNOSIS — B029 Zoster without complications: Secondary | ICD-10-CM | POA: Diagnosis not present

## 2018-09-21 DIAGNOSIS — Z79899 Other long term (current) drug therapy: Secondary | ICD-10-CM

## 2018-09-21 DIAGNOSIS — Z9221 Personal history of antineoplastic chemotherapy: Secondary | ICD-10-CM | POA: Diagnosis not present

## 2018-09-21 LAB — CBC WITH DIFFERENTIAL (CANCER CENTER ONLY)
Abs Immature Granulocytes: 0.03 10*3/uL (ref 0.00–0.07)
Basophils Absolute: 0 10*3/uL (ref 0.0–0.1)
Basophils Relative: 1 %
Eosinophils Absolute: 0.1 10*3/uL (ref 0.0–0.5)
Eosinophils Relative: 1 %
HCT: 31 % — ABNORMAL LOW (ref 36.0–46.0)
Hemoglobin: 9.7 g/dL — ABNORMAL LOW (ref 12.0–15.0)
Immature Granulocytes: 1 %
LYMPHS ABS: 1.2 10*3/uL (ref 0.7–4.0)
Lymphocytes Relative: 24 %
MCH: 26.8 pg (ref 26.0–34.0)
MCHC: 31.3 g/dL (ref 30.0–36.0)
MCV: 85.6 fL (ref 80.0–100.0)
MONOS PCT: 6 %
Monocytes Absolute: 0.3 10*3/uL (ref 0.1–1.0)
Neutro Abs: 3.4 10*3/uL (ref 1.7–7.7)
Neutrophils Relative %: 67 %
Platelet Count: 170 10*3/uL (ref 150–400)
RBC: 3.62 MIL/uL — ABNORMAL LOW (ref 3.87–5.11)
RDW: 14.8 % (ref 11.5–15.5)
WBC Count: 5.1 10*3/uL (ref 4.0–10.5)
nRBC: 0 % (ref 0.0–0.2)

## 2018-09-21 LAB — LACTATE DEHYDROGENASE: LDH: 171 U/L (ref 98–192)

## 2018-09-21 LAB — SAMPLE TO BLOOD BANK

## 2018-09-21 LAB — FERRITIN: Ferritin: 219 ng/mL (ref 11–307)

## 2018-09-21 NOTE — Telephone Encounter (Signed)
Gave avs and calendar ° °

## 2018-09-21 NOTE — Progress Notes (Addendum)
[place] OFFICE PROGRESS NOTE   Diagnosis: Non-Hodgkin's lymphoma, breast cancer  INTERVAL HISTORY:   [name] returns as scheduled.  Appetite varies.  No fevers or sweats.  She denies pain.  She is participating in physical therapy and occupational therapy at home.  Objective:  Vital signs in last 24 hours:  Blood pressure (!) 158/79, pulse 85, temperature 97.8 F (36.6 C), temperature source Oral, resp. rate 18, height 5' 1"  (1.549 m), weight 110 lb (49.9 kg), SpO2 98 %.    HEENT: Neck without mass. Lymphatics: No palpable cervical, supraclavicular, axillary or inguinal lymph nodes. Resp: Diminished breath sounds right lower lung field.  No respiratory distress. Cardio: Regular rate and rhythm.  2/6 systolic murmur. GI: Abdomen soft and nontender.  No hepatomegaly.  No splenomegaly. Vascular: No leg edema. Neuro: Alert.  Follows commands.   Lab Results:  Lab Results  Component Value Date   WBC 5.1 09/21/2018   HGB 9.7 (L) 09/21/2018   HCT 31.0 (L) 09/21/2018   MCV 85.6 09/21/2018   PLT 170 09/21/2018   NEUTROABS 3.4 09/21/2018    Imaging:  No results found.  Medications: I have reviewed the patient's current medications.  Assessment/Plan: 1. Non-Hodgkin lymphoma treated with fludarabine/rituximab, last in July of 2009.  1. Restaging CT 11/24/2010 revealed evidence of progressive lymphoma in the abdomen and pelvis.  2. Initiation of salvage therapy with bendamustine/rituximab 12/04/2010. She completed 3 cycles.  3. Restaging CT 03/03/2011 revealed stable retroperitoneal soft tissue and a marked decrease in the soft tissue thickening associated with dilated loops of small bowel. 2. History of ITP.  3. History of Herpes zoster-maintained on prophylactic acyclovir. 4. History of anemia secondary to non-Hodgkin lymphoma, chemotherapy, and a history of autoimmune hemolysis.  5. Hypertension. 6. Right-hand tremor/ataxia-followed by  neurology, now taking Sinemet, diagnosed with Parkinson's disease 7. Hearing loss-status post placement of an implanted hearing device by Dr. Cresenciano Lick. 8. Bilateral breast cancer, right breast cancer-grade 1, T1 N0, ER/PR positive, and HER2 negative. Left breast cancer, synchronous grade 2, T2 N0, ER/PR positive, and HER2 negative. She began adjuvant Arimidex on 09/15/2010,completed February 2017 9. Inflammatory changes of both lungs on a CT of the chest 11/24/2010-progressive on the restaging CT 03/03/2011, status post a bronchoscopy 03/17/2011 with no evidence of granulomata or tumor. 10. 11 mm spiculated lesion in the lingula on a CT 11/24/2010-less distinct on the CT 03/03/2011. 11. Upper respiratory infection while visiting her son in [place], June 2013, resolved after treatment with Levaquin/steroids 12. Upper respiratory infection,? Pneumonia summer 2014-chest x-ray 02/17/2013 with bilateral infiltrates 13. CT chest abdomen and pelvis on 11/29/2014 with no evidence of active lymphoma or metastatic breast cancer. Previously demonstrated retroperitoneal process appeared improved with probable residual fibrosis. No discrete adenopathy. Interval near complete resolution of patchy airspace opacities in both lungs. 14. Mild swelling and pain at the right lower leg 08/06/2016-negative for acute deep vein thrombosis, possible chronic thrombus in the right popliteal vein 15. Admission July 2018 with GI bleeding, no source for blood loss found on an upper endoscopy 16. Parkinson's disease 17. Dementia   Disposition: [name] appears unchanged.  Hemoglobin is stable to improved.  LDH and ferritin are in normal range.  There is no evidence of hemolysis.  The anemia may be anemia of chronic disease.  She appears asymptomatic.  Plan to follow with observation for now.  We reviewed signs/symptoms suggestive of progressive anemia.  Her husband understands to contact the office should she develop these.  She will return for lab and follow-up in 3 months.  They will contact the office prior to that visit as outlined above or with any other problems.  Patient seen with Dr. Benay Spice.    Ned Card ANP/GNP-BC   09/21/2018  1:45 PM This was a shared visit with Ned Card.  The hemoglobin is stable.  There is no laboratory evidence of hemolysis or iron deficiency.  The anemia may be related to chronic disease, lymphoma involving the bone marrow, or renal insufficiency.  We discussed the differential diagnosis with Ms. Colville and her husband.  We decided to follow her with observation and elected not to proceed with additional diagnostic evaluation for now.  She will contact us for symptoms of anemia.  Julieanne Manson, MD

## 2018-09-22 DIAGNOSIS — E119 Type 2 diabetes mellitus without complications: Secondary | ICD-10-CM | POA: Diagnosis not present

## 2018-09-22 DIAGNOSIS — F0280 Dementia in other diseases classified elsewhere without behavioral disturbance: Secondary | ICD-10-CM | POA: Diagnosis not present

## 2018-09-22 DIAGNOSIS — G20 Parkinson's disease: Secondary | ICD-10-CM | POA: Diagnosis not present

## 2018-09-22 DIAGNOSIS — M545 Low back pain: Secondary | ICD-10-CM | POA: Diagnosis not present

## 2018-09-22 DIAGNOSIS — G8929 Other chronic pain: Secondary | ICD-10-CM | POA: Diagnosis not present

## 2018-09-22 DIAGNOSIS — R1312 Dysphagia, oropharyngeal phase: Secondary | ICD-10-CM | POA: Diagnosis not present

## 2018-09-23 DIAGNOSIS — M545 Low back pain: Secondary | ICD-10-CM | POA: Diagnosis not present

## 2018-09-23 DIAGNOSIS — G20 Parkinson's disease: Secondary | ICD-10-CM | POA: Diagnosis not present

## 2018-09-23 DIAGNOSIS — F0280 Dementia in other diseases classified elsewhere without behavioral disturbance: Secondary | ICD-10-CM | POA: Diagnosis not present

## 2018-09-23 DIAGNOSIS — R1312 Dysphagia, oropharyngeal phase: Secondary | ICD-10-CM | POA: Diagnosis not present

## 2018-09-23 DIAGNOSIS — E119 Type 2 diabetes mellitus without complications: Secondary | ICD-10-CM | POA: Diagnosis not present

## 2018-09-23 DIAGNOSIS — G8929 Other chronic pain: Secondary | ICD-10-CM | POA: Diagnosis not present

## 2018-09-26 DIAGNOSIS — G20 Parkinson's disease: Secondary | ICD-10-CM | POA: Diagnosis not present

## 2018-09-26 DIAGNOSIS — R1312 Dysphagia, oropharyngeal phase: Secondary | ICD-10-CM | POA: Diagnosis not present

## 2018-09-26 DIAGNOSIS — E119 Type 2 diabetes mellitus without complications: Secondary | ICD-10-CM | POA: Diagnosis not present

## 2018-09-26 DIAGNOSIS — F0280 Dementia in other diseases classified elsewhere without behavioral disturbance: Secondary | ICD-10-CM | POA: Diagnosis not present

## 2018-09-26 DIAGNOSIS — G8929 Other chronic pain: Secondary | ICD-10-CM | POA: Diagnosis not present

## 2018-09-26 DIAGNOSIS — M545 Low back pain: Secondary | ICD-10-CM | POA: Diagnosis not present

## 2018-09-27 DIAGNOSIS — E119 Type 2 diabetes mellitus without complications: Secondary | ICD-10-CM | POA: Diagnosis not present

## 2018-09-27 DIAGNOSIS — G8929 Other chronic pain: Secondary | ICD-10-CM | POA: Diagnosis not present

## 2018-09-27 DIAGNOSIS — G20 Parkinson's disease: Secondary | ICD-10-CM | POA: Diagnosis not present

## 2018-09-27 DIAGNOSIS — F0280 Dementia in other diseases classified elsewhere without behavioral disturbance: Secondary | ICD-10-CM | POA: Diagnosis not present

## 2018-09-27 DIAGNOSIS — M545 Low back pain: Secondary | ICD-10-CM | POA: Diagnosis not present

## 2018-09-27 DIAGNOSIS — R1312 Dysphagia, oropharyngeal phase: Secondary | ICD-10-CM | POA: Diagnosis not present

## 2018-09-29 DIAGNOSIS — E119 Type 2 diabetes mellitus without complications: Secondary | ICD-10-CM | POA: Diagnosis not present

## 2018-09-29 DIAGNOSIS — G8929 Other chronic pain: Secondary | ICD-10-CM | POA: Diagnosis not present

## 2018-09-29 DIAGNOSIS — M545 Low back pain: Secondary | ICD-10-CM | POA: Diagnosis not present

## 2018-09-29 DIAGNOSIS — G20 Parkinson's disease: Secondary | ICD-10-CM | POA: Diagnosis not present

## 2018-09-29 DIAGNOSIS — F0280 Dementia in other diseases classified elsewhere without behavioral disturbance: Secondary | ICD-10-CM | POA: Diagnosis not present

## 2018-09-29 DIAGNOSIS — R1312 Dysphagia, oropharyngeal phase: Secondary | ICD-10-CM | POA: Diagnosis not present

## 2018-09-30 DIAGNOSIS — F0280 Dementia in other diseases classified elsewhere without behavioral disturbance: Secondary | ICD-10-CM | POA: Diagnosis not present

## 2018-09-30 DIAGNOSIS — G20 Parkinson's disease: Secondary | ICD-10-CM | POA: Diagnosis not present

## 2018-09-30 DIAGNOSIS — G8929 Other chronic pain: Secondary | ICD-10-CM | POA: Diagnosis not present

## 2018-09-30 DIAGNOSIS — M545 Low back pain: Secondary | ICD-10-CM | POA: Diagnosis not present

## 2018-09-30 DIAGNOSIS — E119 Type 2 diabetes mellitus without complications: Secondary | ICD-10-CM | POA: Diagnosis not present

## 2018-09-30 DIAGNOSIS — R1312 Dysphagia, oropharyngeal phase: Secondary | ICD-10-CM | POA: Diagnosis not present

## 2018-10-03 DIAGNOSIS — G20 Parkinson's disease: Secondary | ICD-10-CM | POA: Diagnosis not present

## 2018-10-03 DIAGNOSIS — E119 Type 2 diabetes mellitus without complications: Secondary | ICD-10-CM | POA: Diagnosis not present

## 2018-10-03 DIAGNOSIS — R1312 Dysphagia, oropharyngeal phase: Secondary | ICD-10-CM | POA: Diagnosis not present

## 2018-10-03 DIAGNOSIS — M545 Low back pain: Secondary | ICD-10-CM | POA: Diagnosis not present

## 2018-10-03 DIAGNOSIS — G8929 Other chronic pain: Secondary | ICD-10-CM | POA: Diagnosis not present

## 2018-10-03 DIAGNOSIS — F0280 Dementia in other diseases classified elsewhere without behavioral disturbance: Secondary | ICD-10-CM | POA: Diagnosis not present

## 2018-10-04 DIAGNOSIS — E119 Type 2 diabetes mellitus without complications: Secondary | ICD-10-CM | POA: Diagnosis not present

## 2018-10-04 DIAGNOSIS — R1312 Dysphagia, oropharyngeal phase: Secondary | ICD-10-CM | POA: Diagnosis not present

## 2018-10-04 DIAGNOSIS — F0280 Dementia in other diseases classified elsewhere without behavioral disturbance: Secondary | ICD-10-CM | POA: Diagnosis not present

## 2018-10-04 DIAGNOSIS — M545 Low back pain: Secondary | ICD-10-CM | POA: Diagnosis not present

## 2018-10-04 DIAGNOSIS — G20 Parkinson's disease: Secondary | ICD-10-CM | POA: Diagnosis not present

## 2018-10-04 DIAGNOSIS — G8929 Other chronic pain: Secondary | ICD-10-CM | POA: Diagnosis not present

## 2018-10-05 DIAGNOSIS — M545 Low back pain: Secondary | ICD-10-CM | POA: Diagnosis not present

## 2018-10-05 DIAGNOSIS — F0280 Dementia in other diseases classified elsewhere without behavioral disturbance: Secondary | ICD-10-CM | POA: Diagnosis not present

## 2018-10-05 DIAGNOSIS — G8929 Other chronic pain: Secondary | ICD-10-CM | POA: Diagnosis not present

## 2018-10-05 DIAGNOSIS — R1312 Dysphagia, oropharyngeal phase: Secondary | ICD-10-CM | POA: Diagnosis not present

## 2018-10-05 DIAGNOSIS — G20 Parkinson's disease: Secondary | ICD-10-CM | POA: Diagnosis not present

## 2018-10-05 DIAGNOSIS — E119 Type 2 diabetes mellitus without complications: Secondary | ICD-10-CM | POA: Diagnosis not present

## 2018-10-10 DIAGNOSIS — F0280 Dementia in other diseases classified elsewhere without behavioral disturbance: Secondary | ICD-10-CM | POA: Diagnosis not present

## 2018-10-10 DIAGNOSIS — E119 Type 2 diabetes mellitus without complications: Secondary | ICD-10-CM | POA: Diagnosis not present

## 2018-10-10 DIAGNOSIS — R1312 Dysphagia, oropharyngeal phase: Secondary | ICD-10-CM | POA: Diagnosis not present

## 2018-10-10 DIAGNOSIS — G20 Parkinson's disease: Secondary | ICD-10-CM | POA: Diagnosis not present

## 2018-10-10 DIAGNOSIS — G8929 Other chronic pain: Secondary | ICD-10-CM | POA: Diagnosis not present

## 2018-10-10 DIAGNOSIS — M545 Low back pain: Secondary | ICD-10-CM | POA: Diagnosis not present

## 2018-10-11 DIAGNOSIS — G8929 Other chronic pain: Secondary | ICD-10-CM | POA: Diagnosis not present

## 2018-10-11 DIAGNOSIS — E119 Type 2 diabetes mellitus without complications: Secondary | ICD-10-CM | POA: Diagnosis not present

## 2018-10-11 DIAGNOSIS — R1312 Dysphagia, oropharyngeal phase: Secondary | ICD-10-CM | POA: Diagnosis not present

## 2018-10-11 DIAGNOSIS — G20 Parkinson's disease: Secondary | ICD-10-CM | POA: Diagnosis not present

## 2018-10-11 DIAGNOSIS — F0280 Dementia in other diseases classified elsewhere without behavioral disturbance: Secondary | ICD-10-CM | POA: Diagnosis not present

## 2018-10-11 DIAGNOSIS — M545 Low back pain: Secondary | ICD-10-CM | POA: Diagnosis not present

## 2018-10-12 DIAGNOSIS — M545 Low back pain: Secondary | ICD-10-CM | POA: Diagnosis not present

## 2018-10-12 DIAGNOSIS — R1312 Dysphagia, oropharyngeal phase: Secondary | ICD-10-CM | POA: Diagnosis not present

## 2018-10-12 DIAGNOSIS — E119 Type 2 diabetes mellitus without complications: Secondary | ICD-10-CM | POA: Diagnosis not present

## 2018-10-12 DIAGNOSIS — G8929 Other chronic pain: Secondary | ICD-10-CM | POA: Diagnosis not present

## 2018-10-12 DIAGNOSIS — G20 Parkinson's disease: Secondary | ICD-10-CM | POA: Diagnosis not present

## 2018-10-12 DIAGNOSIS — F0280 Dementia in other diseases classified elsewhere without behavioral disturbance: Secondary | ICD-10-CM | POA: Diagnosis not present

## 2018-11-01 DIAGNOSIS — E7800 Pure hypercholesterolemia, unspecified: Secondary | ICD-10-CM | POA: Diagnosis not present

## 2018-11-01 DIAGNOSIS — N183 Chronic kidney disease, stage 3 (moderate): Secondary | ICD-10-CM | POA: Diagnosis not present

## 2018-11-01 DIAGNOSIS — G20 Parkinson's disease: Secondary | ICD-10-CM | POA: Diagnosis not present

## 2018-11-01 DIAGNOSIS — N3281 Overactive bladder: Secondary | ICD-10-CM | POA: Diagnosis not present

## 2018-11-01 DIAGNOSIS — G8929 Other chronic pain: Secondary | ICD-10-CM | POA: Diagnosis not present

## 2018-11-01 DIAGNOSIS — F419 Anxiety disorder, unspecified: Secondary | ICD-10-CM | POA: Diagnosis not present

## 2018-11-01 DIAGNOSIS — K219 Gastro-esophageal reflux disease without esophagitis: Secondary | ICD-10-CM | POA: Diagnosis not present

## 2018-11-04 ENCOUNTER — Ambulatory Visit: Payer: Medicare Other | Admitting: Neurology

## 2018-12-12 ENCOUNTER — Other Ambulatory Visit: Payer: Self-pay | Admitting: Neurology

## 2018-12-13 ENCOUNTER — Telehealth: Payer: Self-pay | Admitting: Oncology

## 2018-12-13 NOTE — Telephone Encounter (Signed)
Per [facility] reschedule list moved 5/26 lab/fu to 7/9. Not able to reach patient on home phone. Left message on mobile. Schedule mailed.

## 2018-12-20 ENCOUNTER — Other Ambulatory Visit: Payer: Medicare Other

## 2018-12-20 ENCOUNTER — Ambulatory Visit: Payer: Medicare Other | Admitting: Oncology

## 2019-01-03 DIAGNOSIS — M545 Low back pain: Secondary | ICD-10-CM | POA: Diagnosis not present

## 2019-01-03 DIAGNOSIS — G20 Parkinson's disease: Secondary | ICD-10-CM | POA: Diagnosis not present

## 2019-01-03 DIAGNOSIS — Z853 Personal history of malignant neoplasm of breast: Secondary | ICD-10-CM | POA: Diagnosis not present

## 2019-01-03 DIAGNOSIS — I10 Essential (primary) hypertension: Secondary | ICD-10-CM | POA: Diagnosis not present

## 2019-01-03 DIAGNOSIS — F419 Anxiety disorder, unspecified: Secondary | ICD-10-CM | POA: Diagnosis not present

## 2019-01-03 DIAGNOSIS — N183 Chronic kidney disease, stage 3 (moderate): Secondary | ICD-10-CM | POA: Diagnosis not present

## 2019-01-03 DIAGNOSIS — R413 Other amnesia: Secondary | ICD-10-CM | POA: Diagnosis not present

## 2019-01-03 DIAGNOSIS — N3281 Overactive bladder: Secondary | ICD-10-CM | POA: Diagnosis not present

## 2019-01-03 DIAGNOSIS — Z8572 Personal history of non-Hodgkin lymphomas: Secondary | ICD-10-CM | POA: Diagnosis not present

## 2019-01-03 DIAGNOSIS — N3000 Acute cystitis without hematuria: Secondary | ICD-10-CM | POA: Diagnosis not present

## 2019-01-09 ENCOUNTER — Telehealth (HOSPITAL_COMMUNITY): Payer: Self-pay | Admitting: Radiology

## 2019-01-09 NOTE — Telephone Encounter (Signed)
Left message to call office-Patient needs to schedule an echocardiogram.  

## 2019-01-12 NOTE — Progress Notes (Signed)
Virtual Visit via Video Note The purpose of this virtual visit is to provide medical care while limiting exposure to the novel coronavirus.    Consent was obtained for video visit:  Yes.   Answered questions that patient had about telehealth interaction:  Yes.   I discussed the limitations, risks, security and privacy concerns of performing an evaluation and management service by telemedicine. I also discussed with the patient that there may be a patient responsible charge related to this service. The patient expressed understanding and agreed to proceed.  Pt location: Home Physician Location: home Name of referring provider:  [name], MD I connected with [name] at patients initiation/request on 01/13/2019 at  1:00 PM EDT by video enabled telemedicine application and verified that I am speaking with the correct person using two identifiers. Pt MRN:  [id number] Pt DOB:  1928-03-14 Video Participants:  [name];  Husband    History of Present Illness:  Patient seen today in follow-up for Parkinson's disease with Parkinson's disease dementia.  Her husband accompanies her today and supplies most of the history.  Patient is still on carbidopa/levodopa 25/100, 1 in the morning, 1 in the afternoon and 2 in the evening.  She has had no falls since last visit.  She does have some hallucinations - she will hear and see others in the house.  She is sleeping well at night.  Walking some for exercise but husband having to help motivate and having to help her more and more with getting up and down. did well with therapy but didn't keep up with exercises and now has gone backwards some. She does sleep much of the day.  She is not agitated.  She has seen oncology since last visit and I have reviewed those records.  She has a history of non-Hodgkin's lymphoma.  That has been unchanged since 2009.  She has a history of breast cancer, with treatment completed in 2017.   Current  Outpatient Medications on File Prior to Visit  Medication Sig Dispense Refill  . acetaminophen (TYLENOL) 325 MG tablet Take 650 mg by mouth every 6 (six) hours as needed for mild pain, moderate pain, fever or headache.     [name] acyclovir (ZOVIRAX) 400 MG tablet TAKE 1 TABLET BY MOUTH TWICE DAILY (Patient taking differently: Take 400 mg by mouth 2 (two) times daily. ) 120 tablet 3  . Calcium Carbonate-Vitamin D3 (CALCIUM 600-D) 600-400 MG-UNIT TABS Take 1 tablet by mouth at bedtime.    . carbidopa-levodopa (SINEMET IR) 25-100 MG tablet TAKE 1 TABLET BY MOUTH EVERY MORNING, 1 IN THE AFTERNOON, AND 2 IN THE EVENING 360 tablet 1  . cetirizine (ZYRTEC) 10 MG tablet Take 10 mg by mouth daily at 12 noon.     . cholecalciferol (VITAMIN D) 1000 units tablet Take 1,000 Units by mouth every evening.     . donepezil (ARICEPT) 10 MG tablet TAKE 1 TABLET BY MOUTH DAILY 90 tablet 1  . entacapone (COMTAN) 200 MG tablet TAKE 1 TABLET BY MOUTH 3 TIMES DAILY WITH SINEMET (Patient taking differently: Take 200 mg by mouth 3 (three) times daily. ) 270 tablet 1  . Fluticasone-Salmeterol (ADVAIR) 250-50 MCG/DOSE AEPB Inhale 1 puff into the lungs 2 (two) times daily.    [name] ketoconazole (NIZORAL) 2 % shampoo Apply 1 application topically See admin instructions. 3 times weekly as needed for dry skin.    . Multiple Vitamins-Minerals (MULTIVITAMIN WITH MINERALS) tablet Take 1 tablet by mouth daily with breakfast.     .  polyethylene glycol powder (MIRALAX) powder Take 17 g by mouth daily as needed for mild constipation.    . sertraline (ZOLOFT) 100 MG tablet Take 1 tablet (100 mg total) by mouth daily. 90 tablet 1   No current facility-administered medications on file prior to visit.      Observations/Objective:   Vitals:   01/13/19 0849  Weight: 110 lb (49.9 kg)  Height: 5\' 3"  (1.6 m)   GEN:  The patient appears stated age and is in NAD.  Neurological examination:  Orientation: The patient is alert and oriented x3.  Cranial nerves: There is good facial symmetry. There is minimal facial hypomimia.  The speech is fluent and clear. Soft palate rises symmetrically and there is no tongue deviation. Hearing is intact to conversational tone. Motor: Strength is at least antigravity x 4.   Shoulder shrug is equal and symmetric.  There is no pronator drift.  Movement examination: Tone: unable Abnormal movements: none Coordination:  There is no decremation but she is slow.  She is apraxic. Gait and Station: The patients husband assists her in getting up.  She is stooped with cane in the R hand.  She is somewhat unstable.   Assessment and Plan:   1.  Idiopathic Parkinson's disease, dx 08/2012.               -We discussed the diagnosis as well as pathophysiology of the disease.  We discussed treatment options as well as prognostic indicators.  Patient education was provided.             -continue carbidopa/levodopa 25/100, 1/1/2  -husband concerned not walking as well.  Husband will call me when feels comfortable having therapy in the home again (due to pandemic)       2.  Anxiety             -she is still on sertraline 50 mg daily.   3.  Cardiac murmur             -being followed yearly by cardiology for mod AS.  Cardiology just left a message for family that it was time to schedule an echocardiogram. 4.  PDD, advanced             -stay off of donepezil.  Caused n/v             -has 24 hour per day care and needs that 5. Urinary incontinence             -seeing [facility]stim.  Follow Up Instructions:  9 months   -I discussed the assessment and treatment plan with the patient. The patient was provided an opportunity to ask questions and all were answered. The patient agreed with the plan and demonstrated an understanding of the instructions.   The patient was advised to call back or seek an in-person evaluation if the symptoms worsen or if the condition fails to improve as anticipated.     Total Time spent in visit with the patient was:  20 min, of which more than 50% of the time was spent in counseling.   Pt understands and agrees with the plan of care outlined.     Alonza Bogus, DO

## 2019-01-13 ENCOUNTER — Encounter: Payer: Self-pay | Admitting: Neurology

## 2019-01-13 ENCOUNTER — Telehealth (INDEPENDENT_AMBULATORY_CARE_PROVIDER_SITE_OTHER): Payer: Medicare Other | Admitting: Neurology

## 2019-01-13 ENCOUNTER — Ambulatory Visit: Payer: Medicare Other | Admitting: Neurology

## 2019-01-13 ENCOUNTER — Other Ambulatory Visit: Payer: Self-pay

## 2019-01-13 ENCOUNTER — Encounter

## 2019-01-13 DIAGNOSIS — G20 Parkinson's disease: Secondary | ICD-10-CM | POA: Diagnosis not present

## 2019-01-13 DIAGNOSIS — G20A1 Parkinson's disease without dyskinesia, without mention of fluctuations: Secondary | ICD-10-CM

## 2019-01-13 DIAGNOSIS — F0280 Dementia in other diseases classified elsewhere without behavioral disturbance: Secondary | ICD-10-CM

## 2019-01-16 ENCOUNTER — Ambulatory Visit: Payer: Medicare Other | Admitting: Neurology

## 2019-02-01 DIAGNOSIS — C4402 Squamous cell carcinoma of skin of lip: Secondary | ICD-10-CM | POA: Diagnosis not present

## 2019-02-02 ENCOUNTER — Telehealth: Payer: Self-pay | Admitting: Oncology

## 2019-02-02 ENCOUNTER — Other Ambulatory Visit: Payer: Self-pay

## 2019-02-02 ENCOUNTER — Inpatient Hospital Stay (HOSPITAL_BASED_OUTPATIENT_CLINIC_OR_DEPARTMENT_OTHER): Payer: Medicare Other | Admitting: Oncology

## 2019-02-02 ENCOUNTER — Inpatient Hospital Stay: Payer: Medicare Other | Attending: Oncology

## 2019-02-02 VITALS — BP 151/82 | HR 69 | Temp 97.7°F | Resp 17 | Ht 63.0 in | Wt 112.5 lb

## 2019-02-02 DIAGNOSIS — G20 Parkinson's disease: Secondary | ICD-10-CM | POA: Diagnosis not present

## 2019-02-02 DIAGNOSIS — Z853 Personal history of malignant neoplasm of breast: Secondary | ICD-10-CM

## 2019-02-02 DIAGNOSIS — I10 Essential (primary) hypertension: Secondary | ICD-10-CM | POA: Diagnosis not present

## 2019-02-02 DIAGNOSIS — Z170 Estrogen receptor positive status [ER+]: Secondary | ICD-10-CM | POA: Diagnosis not present

## 2019-02-02 DIAGNOSIS — Z8572 Personal history of non-Hodgkin lymphomas: Secondary | ICD-10-CM | POA: Diagnosis not present

## 2019-02-02 DIAGNOSIS — C8590 Non-Hodgkin lymphoma, unspecified, unspecified site: Secondary | ICD-10-CM

## 2019-02-02 DIAGNOSIS — D649 Anemia, unspecified: Secondary | ICD-10-CM | POA: Insufficient documentation

## 2019-02-02 LAB — CBC WITH DIFFERENTIAL (CANCER CENTER ONLY)
Abs Immature Granulocytes: 0.02 10*3/uL (ref 0.00–0.07)
Basophils Absolute: 0 10*3/uL (ref 0.0–0.1)
Basophils Relative: 1 %
Eosinophils Absolute: 0.1 10*3/uL (ref 0.0–0.5)
Eosinophils Relative: 2 %
HCT: 32.3 % — ABNORMAL LOW (ref 36.0–46.0)
Hemoglobin: 10.7 g/dL — ABNORMAL LOW (ref 12.0–15.0)
Immature Granulocytes: 1 %
Lymphocytes Relative: 19 %
Lymphs Abs: 0.8 10*3/uL (ref 0.7–4.0)
MCH: 28.8 pg (ref 26.0–34.0)
MCHC: 33.1 g/dL (ref 30.0–36.0)
MCV: 87.1 fL (ref 80.0–100.0)
Monocytes Absolute: 0.3 10*3/uL (ref 0.1–1.0)
Monocytes Relative: 7 %
Neutro Abs: 2.8 10*3/uL (ref 1.7–7.7)
Neutrophils Relative %: 70 %
Platelet Count: 128 10*3/uL — ABNORMAL LOW (ref 150–400)
RBC: 3.71 MIL/uL — ABNORMAL LOW (ref 3.87–5.11)
RDW: 13.4 % (ref 11.5–15.5)
WBC Count: 4 10*3/uL (ref 4.0–10.5)
nRBC: 0 % (ref 0.0–0.2)

## 2019-02-02 LAB — SAMPLE TO BLOOD BANK

## 2019-02-02 LAB — RETICULOCYTES
Immature Retic Fract: 7.8 % (ref 2.3–15.9)
RBC.: 3.72 MIL/uL — ABNORMAL LOW (ref 3.87–5.11)
Retic Count, Absolute: 80 10*3/uL (ref 19.0–186.0)
Retic Ct Pct: 2.2 % (ref 0.4–3.1)

## 2019-02-02 NOTE — Telephone Encounter (Signed)
Scheduled appt per 7/9 los. Printed calendar and avs.

## 2019-02-02 NOTE — Progress Notes (Signed)
[facility] OFFICE PROGRESS NOTE   Diagnosis: Non-Hodgkin's lymphoma, anemia, breast cancer  INTERVAL HISTORY:   [name] returns for a scheduled visit.  She is here today with her husband.  He now performs most of her care including bathing and help to the bathroom.  Her mental status has declined.  She reports low back and intermittent abdominal pain.  Good appetite. She has developed a lesion at the lower lip and is scheduled for a biopsy by an oral surgeon. Objective:  Vital signs in last 24 hours:  Blood pressure (!) 151/82, pulse 69, temperature 97.7 F (36.5 C), temperature source Oral, resp. rate 17, height 5' 3"  (1.6 m), weight 112 lb 8 oz (51 kg), SpO2 98 %.    HEENT: 1 cm round raised lesion at the right lower outer lip Lymphatics: No cervical, supraclavicular, or axillary nodes GI: No hepatosplenomegaly, nontender, no mass Vascular: No leg edema Musculoskeletal: Kyphoscoliosis    Lab Results:  Lab Results  Component Value Date   WBC 4.0 02/02/2019   HGB 10.7 (L) 02/02/2019   HCT 32.3 (L) 02/02/2019   MCV 87.1 02/02/2019   PLT 128 (L) 02/02/2019   NEUTROABS 2.8 02/02/2019    CMP  Lab Results  Component Value Date   NA 140 08/30/2018   K 4.1 08/30/2018   CL 105 08/30/2018   CO2 27 08/30/2018   GLUCOSE 98 08/30/2018   BUN 10 08/30/2018   CREATININE 0.95 08/30/2018   CALCIUM 9.3 08/30/2018   PROT 6.6 08/30/2018   ALBUMIN 3.0 (L) 08/30/2018   AST 15 08/30/2018   ALT <6 08/30/2018   ALKPHOS 122 08/30/2018   BILITOT 0.5 08/30/2018   GFRNONAA 53 (L) 08/30/2018   GFRAA >60 08/30/2018    Medications: I have reviewed the patient's current medications.   Assessment/Plan: 1. Non-Hodgkin lymphoma treated with fludarabine/rituximab, last in July of 2009.  1. Restaging CT 11/24/2010 revealed evidence of progressive lymphoma in the abdomen and pelvis.  2. Initiation of salvage therapy with bendamustine/rituximab 12/04/2010. She  completed 3 cycles.  3. Restaging CT 03/03/2011 revealed stable retroperitoneal soft tissue and a marked decrease in the soft tissue thickening associated with dilated loops of small bowel. 2. History of ITP.  3. History of Herpes zoster-maintained on prophylactic acyclovir. 4. History of anemia secondary to non-Hodgkin lymphoma, chemotherapy, and a history of autoimmune hemolysis.  5. Hypertension. 6. Right-hand tremor/ataxia-followed by neurology, now taking Sinemet, diagnosed with Parkinson's disease 7. Hearing loss-status post placement of an implanted hearing device by Dr. Cresenciano Lick. 8. Bilateral breast cancer, right breast cancer-grade 1, T1 N0, ER/PR positive, and HER2 negative. Left breast cancer, synchronous grade 2, T2 N0, ER/PR positive, and HER2 negative. She began adjuvant Arimidex on 09/15/2010,completed February 2017 9. Inflammatory changes of both lungs on a CT of the chest 11/24/2010-progressive on the restaging CT 03/03/2011, status post a bronchoscopy 03/17/2011 with no evidence of granulomata or tumor. 10. 11 mm spiculated lesion in the lingula on a CT 11/24/2010-less distinct on the CT 03/03/2011. 11. Upper respiratory infection while visiting her son in [place], June 2013, resolved after treatment with Levaquin/steroids 12. Upper respiratory infection,? Pneumonia summer 2014-chest x-ray 02/17/2013 with bilateral infiltrates 13. CT chest abdomen and pelvis on 11/29/2014 with no evidence of active lymphoma or metastatic breast cancer. Previously demonstrated retroperitoneal process appeared improved with probable residual fibrosis. No discrete adenopathy. Interval near complete resolution of patchy airspace opacities in both lungs. 14. Mild swelling and pain at the right lower  leg 08/06/2016-negative for acute deep vein thrombosis, possible chronic thrombus in the right popliteal vein 15. Admission July 2018 with GI bleeding, no source for blood loss found on an upper endoscopy  16. Parkinson's disease 17. Dementia  Disposition: [name] appears to have significant dementia.  The hemoglobin is higher compared to when I saw her in February.  The anemia is likely secondary to chronic disease and low-grade lymphoma.  She does not appear symptomatic from the anemia.  There is no clinical evidence for progression of lymphoma or breast cancer.  The lesion at the lower lip appears malignant.  She is scheduled to have a biopsy.  She would like to continue follow-up at the Cancer center.  [name] will return for office visit 4 months.  We will see her sooner as needed.  Betsy Coder, MD  02/02/2019  11:55 AM

## 2019-02-15 ENCOUNTER — Telehealth (HOSPITAL_COMMUNITY): Payer: Self-pay | Admitting: Radiology

## 2019-02-15 DIAGNOSIS — C4402 Squamous cell carcinoma of skin of lip: Secondary | ICD-10-CM | POA: Diagnosis not present

## 2019-02-15 NOTE — Telephone Encounter (Signed)
Left message to call office-Patient needs to schedule an echocardiogram.  

## 2019-03-09 DIAGNOSIS — H01004 Unspecified blepharitis left upper eyelid: Secondary | ICD-10-CM | POA: Diagnosis not present

## 2019-03-09 DIAGNOSIS — H01002 Unspecified blepharitis right lower eyelid: Secondary | ICD-10-CM | POA: Diagnosis not present

## 2019-03-09 DIAGNOSIS — H01001 Unspecified blepharitis right upper eyelid: Secondary | ICD-10-CM | POA: Diagnosis not present

## 2019-03-09 DIAGNOSIS — H524 Presbyopia: Secondary | ICD-10-CM | POA: Diagnosis not present

## 2019-03-09 DIAGNOSIS — H01005 Unspecified blepharitis left lower eyelid: Secondary | ICD-10-CM | POA: Diagnosis not present

## 2019-03-13 ENCOUNTER — Other Ambulatory Visit: Payer: Self-pay | Admitting: Neurology

## 2019-03-13 NOTE — Telephone Encounter (Signed)
Requested Prescriptions   Pending Prescriptions Disp Refills  . carbidopa-levodopa (SINEMET IR) 25-100 MG tablet [Pharmacy Med Name: CARBIDOPA-LEVODOPA 25-100 MG TAB] 360 tablet 1    Sig: TAKE 1 TABLET BY MOUTH EVERY MORNING, 1 TABLET BY MOUTH IN THE AFTERNOON, AND 2 TABLETS BY MOUTH IN THE EVENING   Rx last filled:08/15/18 #360 1 refills  Pt last seen: 01/13/19 Follow up appt scheduled:10/13/19

## 2019-03-14 ENCOUNTER — Other Ambulatory Visit: Payer: Self-pay | Admitting: *Deleted

## 2019-03-14 MED ORDER — ACYCLOVIR 400 MG PO TABS: 400.0000 mg | ORAL_TABLET | Freq: Two times a day (BID) | ORAL | 3 refills | Status: DC

## 2019-03-28 ENCOUNTER — Encounter: Payer: Self-pay | Admitting: Cardiology

## 2019-03-28 ENCOUNTER — Ambulatory Visit (INDEPENDENT_AMBULATORY_CARE_PROVIDER_SITE_OTHER): Payer: Medicare Other | Admitting: Cardiology

## 2019-03-28 ENCOUNTER — Other Ambulatory Visit: Payer: Self-pay

## 2019-03-28 VITALS — BP 170/90 | HR 77 | Ht 63.0 in | Wt 116.4 lb

## 2019-03-28 DIAGNOSIS — I350 Nonrheumatic aortic (valve) stenosis: Secondary | ICD-10-CM | POA: Diagnosis not present

## 2019-03-28 DIAGNOSIS — I951 Orthostatic hypotension: Secondary | ICD-10-CM | POA: Diagnosis not present

## 2019-03-28 DIAGNOSIS — G20 Parkinson's disease: Secondary | ICD-10-CM

## 2019-03-28 NOTE — Patient Instructions (Signed)
Medication Instructions:  Your physician recommends that you continue on your current medications as directed. Please refer to the Current Medication list given to you today.  If you need a refill on your cardiac medications before your next appointment, please call your pharmacy.   Lab work: None Ordered If you have labs (blood work) drawn today and your tests are completely normal, you will receive your results only by: . MyChart Message (if you have MyChart) OR . A paper copy in the mail If you have any lab test that is abnormal or we need to change your treatment, we will call you to review the results.  Testing/Procedures: None Ordered  Follow-Up: At [facility], you and your health needs are our priority.  As part of our continuing mission to provide you with exceptional heart care, we have created designated Provider Care Teams.  These Care Teams include your primary Cardiologist (physician) and Advanced Practice Providers (APPs -  Physician Assistants and Nurse Practitioners) who all work together to provide you with the care you need, when you need it. You will need a follow up appointment in 1 years.  Please call our office 2 months in advance to schedule this appointment.  You may see Mark Skains, MD or one of the following Advanced Practice Providers on your designated Care Team:   Lori Gerhardt, NP Laura Ingold, NP . Jill McDaniel, NP     

## 2019-03-28 NOTE — Progress Notes (Signed)
Cardiology Office Note    Date:  03/28/2019   ID:  [name], DOB 02-27-1928, MRN [id number]  PCP:  [name], MD  Cardiologist:   [name], MD     History of Present Illness:  [name] is a 85 y.o. female here for Follow-up of aortic stenosis. She's not having any shortness of breath, no chest pain, no unexplained syncope. No palpitations. She has been seeing Dr. [name] for years for Parkinson's.  She was hospitalized in September 2017 with community-acquired pneumonia.  She has also had some orthostatic hypotension for instance on 09/11/16 supine 185/89, 1 minute after standing 113/52 with wooziness, 3 minutes 128/65. She is hydrating. Dr. Luna Glasgow the possibility of compression binder.   Another 126/67 sitting, standing 87/47  Her husband also is a patient of mine. Prior bypass surgery.  Overall she has been doing quite well however she does have high blood pressure readings occasionally while at Parkinson's rehabilitation. She has not had any further syncopal episodes. No chest pain, no shortness of breath. Back still bothering her. 43 wedding anniver. BP ran high in back injection clinic.   03/28/2019- here for follow-up of orthostatic hypotension.  Has been stable.  No falls.  Blood pressure is elevated today but we are willing to tolerate this.  Her parkinsonian dementia has advanced.  Anxiety has advanced.  No fevers chills nausea vomiting syncope.  Past Medical History:  Diagnosis Date  . Anxiety   . Asthma   . Depression   . Diabetes mellitus without complication ([place])    [id number].Marland KitchenMarland Kitchenpt denies  . GERD (gastroesophageal reflux disease)   . Hearing loss   . NHL (non-Hodgkin's lymphoma) ([place])    nhl dx 9/04 breast ca dx1/12  . Parkinson disease [facility])     Past Surgical History:  Procedure Laterality Date  . Ba-HA Ear implant    . ESOPHAGOGASTRODUODENOSCOPY N/A 02/04/2017   Procedure: ESOPHAGOGASTRODUODENOSCOPY (EGD);  Surgeon: Arta Silence,  MD;  Location: [facility];  Service: Endoscopy;  Laterality: N/A;  . MASTECTOMY  2 /8/ 12   bilateral  . MASTECTOMY      Outpatient Medications Prior to Visit  Medication Sig Dispense Refill  . acetaminophen (TYLENOL) 325 MG tablet Take 650 mg by mouth every 6 (six) hours as needed for mild pain, moderate pain, fever or headache.     [name] acyclovir (ZOVIRAX) 400 MG tablet Take 1 tablet (400 mg total) by mouth 2 (two) times daily. 120 tablet 3  . Calcium Carbonate (CALCIUM-CARB 600 PO) Take 600 mg by mouth daily.    . carbidopa-levodopa (SINEMET IR) 25-100 MG tablet TAKE 1 TABLET BY MOUTH EVERY MORNING, 1 TABLET BY MOUTH IN THE AFTERNOON, AND 2 TABLETS BY MOUTH IN THE EVENING 360 tablet 1  . cholecalciferol (VITAMIN D) 1000 units tablet Take 1,000 Units by mouth every evening.     . donepezil (ARICEPT) 10 MG tablet TAKE 1 TABLET BY MOUTH DAILY 90 tablet 1  . entacapone (COMTAN) 200 MG tablet TAKE 1 TABLET BY MOUTH 3 TIMES DAILY WITH SINEMET 270 tablet 1  . Fluticasone-Salmeterol (ADVAIR) 250-50 MCG/DOSE AEPB Inhale 1 puff into the lungs 2 (two) times daily.    . Multiple Vitamins-Minerals (MULTIVITAMIN WITH MINERALS) tablet Take 1 tablet by mouth daily with breakfast.     . sertraline (ZOLOFT) 100 MG tablet Take 1 tablet (100 mg total) by mouth daily. 90 tablet 1  . mirabegron ER (MYRBETRIQ) 50 MG TB24 tablet Take 50 mg by mouth daily.    [name]  ondansetron (ZOFRAN) 8 MG tablet Take by mouth 2 (two) times daily.    . pantoprazole (PROTONIX) 40 MG tablet Take 40 mg by mouth daily.     No facility-administered medications prior to visit.      Allergies:   Azithromycin and Tramadol   Social History   Socioeconomic History  . Marital status: Married    Spouse name: Not on file  . Number of children: Not on file  . Years of education: Not on file  . Highest education level: Not on file  Occupational History  . Occupation: retired    Fish farm manager: RETIRED    Comment: [employer]  Social Needs   . Financial resource strain: Not on file  . Food insecurity    Worry: Not on file    Inability: Not on file  . Transportation needs    Medical: Not on file    Non-medical: Not on file  Tobacco Use  . Smoking status: Never Smoker  . Smokeless tobacco: Never Used  . Tobacco comment: husband wsa smoker  Substance and Sexual Activity  . Alcohol use: No    Comment: no  . Drug use: No  . Sexual activity: Not on file  Lifestyle  . Physical activity    Days per week: Not on file    Minutes per session: Not on file  . Stress: Not on file  Relationships  . Social Herbalist on phone: Not on file    Gets together: Not on file    Attends religious service: Not on file    Active member of club or organization: Not on file    Attends meetings of clubs or organizations: Not on file    Relationship status: Not on file  Other Topics Concern  . Not on file  Social History Narrative  . Not on file     Family History:  The patient's family history includes Cancer in her father and mother; Lung cancer in her father.   ROS:   Please see the history of present illness.    Review of Systems  All other systems reviewed and are negative.      PHYSICAL EXAM:   VS:  BP (!) 170/90   Pulse 77   Ht 5\' 3"  (1.6 m)   Wt 116 lb 6.4 oz (52.8 kg)   LMP  (LMP Unknown)   SpO2 97%   BMI 20.62 kg/m    GEN: Elderly, in no acute distress  HEENT: normal  Neck: no JVD, + carotid bruits, or masses Cardiac: RRR; 3/6 systolic murmur unchanged, no rubs, or gallops,no edema  Respiratory:  clear to auscultation bilaterally, normal work of breathing GI: soft, nontender, nondistended, + BS MS: no deformity or atrophy  Skin: warm and dry, no rash Neuro: Sitting in chair, hard of hearing.  Memory impairment seems to have advanced. Psych: euthymic mood, full affect    Wt Readings from Last 3 Encounters:  03/28/19 116 lb 6.4 oz (52.8 kg)  02/02/19 112 lb 8 oz (51 kg)  01/13/19 110 lb (49.9 kg)       Studies/Labs Reviewed:   EKG: 08/03/2018-sinus rhythm, possible LVH personally reviewed-02/11/18 - NSR 69. LVH.   Recent Labs: 08/30/2018: ALT <6; BUN 10; Creatinine 0.95; Potassium 4.1; Sodium 140 02/02/2019: Hemoglobin 10.7; Platelet Count 128   Lipid Panel No results found for: CHOL, TRIG, HDL, CHOLHDL, VLDL, LDLCALC, LDLDIRECT  Additional studies/ records that were reviewed today include:   As below Echocardiogram  09/12/15: - Compared to a prior echo in 2009, there is now moderate aortic   stenosis with AVA around 1.1-1.2 cm2. The LVEF is higher at   65-70%.  ECHO 10/02/16 - Left ventricle: The cavity size was normal. Wall thickness was   increased in a pattern of mild LVH. Systolic function was normal.   The estimated ejection fraction was in the range of 60% to 65%.   Wall motion was normal; there were no regional wall motion   abnormalities. Doppler parameters are consistent with abnormal   left ventricular relaxation (grade 1 diastolic dysfunction). The   E/e&' ratio is >15, suggesting elevated LV filling pressure. - Aortic valve: Calcified leaflets with restricted excursion. There   is moderate stenosis. There was trivial regurgitation. Mean   gradient (S): 31 mm Hg. Peak gradient (S): 47 mm Hg. Valve area   (VTI): 0.8 cm^2. - Mitral valve: Calcified annulus. Mildly thickened leaflets .   There was mild regurgitation. - Left atrium: The atrium was normal in size. - Atrial septum: No defect or patent foramen ovale was identified. - Tricuspid valve: There was mild regurgitation. - Pulmonary arteries: PA peak pressure: 33 mm Hg (S). - Inferior vena cava: The vessel was normal in size. The   respirophasic diameter changes were in the normal range (>= 50%),   consistent with normal central venous pressure.  Impressions:  - Compared to a prior study in 2017, the mean aortic valve gradient   has increased rom 28 to 31 mmHg, consistent with moderate   stenosis.   02/17/18 ECHO  - Left ventricle: The cavity size was normal. Wall thickness was   increased in a pattern of mild LVH. There was moderate focal   basal hypertrophy of the septum. Systolic function was normal.   The estimated ejection fraction was in the range of 60% to 65%.   Wall motion was normal; there were no regional wall motion   abnormalities. Doppler parameters are consistent with abnormal   left ventricular relaxation (grade 1 diastolic dysfunction).   Doppler parameters are consistent with high ventricular filling   pressure. - Aortic valve: Valve mobility was restricted. There was moderate   to severe stenosis. There was mild regurgitation. Valve area   (VTI): 1 cm^2. Valve area (Vmax): 0.8 cm^2. Valve area (Vmean):   0.77 cm^2. - Mitral valve: Calcified annulus. There was mild regurgitation. - Left atrium: The atrium was mildly dilated.  Impressions:  - Normal LV systolic function; mild LVH; mild diastolic   dysfunction; calcified aortic valve with moderate to severe AS   and mild AI; mild MR; mild LAE.   ASSESSMENT:    1. Nonrheumatic aortic valve stenosis   2. Parkinson disease ([place])   3. Orthostatic hypotension      PLAN:  In order of problems listed above:  Aortic stenosis -Continues to be moderate in severity on repeat echocardiogram. Has been battling with some orthostatic hypotension. With her moderate severity aortic stenosis, this should not be playing a significant hemodynamic role at this point.  I will forego echocardiogram this year since we just did 1 in 2019.  Her memory impairment has advanced.  No symptoms reported.  Parkinson's disease/memory impairment -Her orthostatic hypotension likely goes hand in hand with dysautonomia. Salt liberalization, fluids, support hose.  -I'm willing to tolerate a higher blood pressure given her prior issues with severe orthostatic hypotension. Of course, if she has significant symptoms with her high blood pressure  i.e. strokelike symptoms or severe  shortness of breath, she is to seek medical attention.  No changes stable continue with current therapy.  Orthostatic hypotension -As above, no new changes.  Willing to tolerate higher blood pressure.  15-month follow-up  Medication Adjustments/Labs and Tests Ordered: Current medicines are reviewed at length with the patient today.  Concerns regarding medicines are outlined above.  Medication changes, Labs and Tests ordered today are listed in the Patient Instructions below. Patient Instructions  Medication Instructions:  Your physician recommends that you continue on your current medications as directed. Please refer to the Current Medication list given to you today.  If you need a refill on your cardiac medications before your next appointment, please call your pharmacy.   Lab work: None Ordered  If you have labs (blood work) drawn today and your tests are completely normal, you will receive your results only by: [name] MyChart Message (if you have MyChart) OR . A paper copy in the mail If you have any lab test that is abnormal or we need to change your treatment, we will call you to review the results.  Testing/Procedures: None Ordered  Follow-Up: At [facility], you and your health needs are our priority.  As part of our continuing mission to provide you with exceptional heart care, we have created designated Provider Care Teams.  These Care Teams include your primary Cardiologist (physician) and Advanced Practice Providers (APPs -  Physician Assistants and Nurse Practitioners) who all work together to provide you with the care you need, when you need it. You will need a follow up appointment in 1 years.  Please call our office 2 months in advance to schedule this appointment.  You may see [name], MD or one of the following Advanced Practice Providers on your designated Care Team:   Truitt Merle, NP Cecilie Kicks, NP . Kathyrn Drown, NP          Signed, [name], MD  03/28/2019 5:03 PM    [address], [place], [place]  [address] Phone: [phone]; Fax: [phone]

## 2019-04-14 DIAGNOSIS — R350 Frequency of micturition: Secondary | ICD-10-CM | POA: Diagnosis not present

## 2019-04-28 DIAGNOSIS — R350 Frequency of micturition: Secondary | ICD-10-CM | POA: Diagnosis not present

## 2019-05-04 ENCOUNTER — Other Ambulatory Visit: Payer: Self-pay

## 2019-05-04 ENCOUNTER — Emergency Department (HOSPITAL_COMMUNITY): Payer: Medicare Other

## 2019-05-04 ENCOUNTER — Emergency Department (HOSPITAL_COMMUNITY)
Admission: EM | Admit: 2019-05-04 | Discharge: 2019-05-04 | Disposition: A | Discharge status: Home / Self Care | Payer: Medicare Other | Attending: Emergency Medicine | Admitting: Emergency Medicine

## 2019-05-04 ENCOUNTER — Encounter (HOSPITAL_COMMUNITY): Payer: Self-pay | Admitting: Student

## 2019-05-04 DIAGNOSIS — Y9301 Activity, walking, marching and hiking: Secondary | ICD-10-CM | POA: Insufficient documentation

## 2019-05-04 DIAGNOSIS — R52 Pain, unspecified: Secondary | ICD-10-CM | POA: Diagnosis not present

## 2019-05-04 DIAGNOSIS — Y999 Unspecified external cause status: Secondary | ICD-10-CM | POA: Insufficient documentation

## 2019-05-04 DIAGNOSIS — Y92019 Unspecified place in single-family (private) house as the place of occurrence of the external cause: Secondary | ICD-10-CM | POA: Insufficient documentation

## 2019-05-04 DIAGNOSIS — Z8572 Personal history of non-Hodgkin lymphomas: Secondary | ICD-10-CM | POA: Diagnosis not present

## 2019-05-04 DIAGNOSIS — S59911A Unspecified injury of right forearm, initial encounter: Secondary | ICD-10-CM | POA: Diagnosis not present

## 2019-05-04 DIAGNOSIS — W19XXXA Unspecified fall, initial encounter: Secondary | ICD-10-CM | POA: Diagnosis not present

## 2019-05-04 DIAGNOSIS — M79631 Pain in right forearm: Secondary | ICD-10-CM | POA: Diagnosis not present

## 2019-05-04 DIAGNOSIS — M546 Pain in thoracic spine: Secondary | ICD-10-CM | POA: Insufficient documentation

## 2019-05-04 DIAGNOSIS — Z853 Personal history of malignant neoplasm of breast: Secondary | ICD-10-CM | POA: Insufficient documentation

## 2019-05-04 DIAGNOSIS — S4991XA Unspecified injury of right shoulder and upper arm, initial encounter: Secondary | ICD-10-CM | POA: Diagnosis present

## 2019-05-04 DIAGNOSIS — Z23 Encounter for immunization: Secondary | ICD-10-CM | POA: Diagnosis not present

## 2019-05-04 DIAGNOSIS — S0990XA Unspecified injury of head, initial encounter: Secondary | ICD-10-CM | POA: Diagnosis not present

## 2019-05-04 DIAGNOSIS — S59901A Unspecified injury of right elbow, initial encounter: Secondary | ICD-10-CM | POA: Diagnosis not present

## 2019-05-04 DIAGNOSIS — Z79899 Other long term (current) drug therapy: Secondary | ICD-10-CM | POA: Diagnosis not present

## 2019-05-04 DIAGNOSIS — S42211A Unspecified displaced fracture of surgical neck of right humerus, initial encounter for closed fracture: Secondary | ICD-10-CM | POA: Diagnosis not present

## 2019-05-04 DIAGNOSIS — S199XXA Unspecified injury of neck, initial encounter: Secondary | ICD-10-CM | POA: Diagnosis not present

## 2019-05-04 DIAGNOSIS — G20 Parkinson's disease: Secondary | ICD-10-CM | POA: Diagnosis not present

## 2019-05-04 DIAGNOSIS — R410 Disorientation, unspecified: Secondary | ICD-10-CM | POA: Diagnosis not present

## 2019-05-04 DIAGNOSIS — R0789 Other chest pain: Secondary | ICD-10-CM | POA: Diagnosis not present

## 2019-05-04 DIAGNOSIS — M7989 Other specified soft tissue disorders: Secondary | ICD-10-CM | POA: Diagnosis not present

## 2019-05-04 DIAGNOSIS — M542 Cervicalgia: Secondary | ICD-10-CM | POA: Insufficient documentation

## 2019-05-04 DIAGNOSIS — W1830XA Fall on same level, unspecified, initial encounter: Secondary | ICD-10-CM | POA: Diagnosis not present

## 2019-05-04 DIAGNOSIS — S299XXA Unspecified injury of thorax, initial encounter: Secondary | ICD-10-CM | POA: Diagnosis not present

## 2019-05-04 DIAGNOSIS — I10 Essential (primary) hypertension: Secondary | ICD-10-CM | POA: Diagnosis not present

## 2019-05-04 DIAGNOSIS — M25511 Pain in right shoulder: Secondary | ICD-10-CM | POA: Diagnosis not present

## 2019-05-04 DIAGNOSIS — R58 Hemorrhage, not elsewhere classified: Secondary | ICD-10-CM | POA: Diagnosis not present

## 2019-05-04 DIAGNOSIS — M25521 Pain in right elbow: Secondary | ICD-10-CM | POA: Diagnosis not present

## 2019-05-04 LAB — CBG MONITORING, ED: Glucose-Capillary: 138 mg/dL — ABNORMAL HIGH (ref 70–99)

## 2019-05-04 MED ORDER — TETANUS-DIPHTH-ACELL PERTUSSIS 5-2.5-18.5 LF-MCG/0.5 IM SUSP
0.5000 mL | Freq: Once | INTRAMUSCULAR | Status: AC
  Administered 2019-05-04: 0.5 mL via INTRAMUSCULAR
  Filled 2019-05-04: qty 0.5

## 2019-05-04 MED ORDER — FENTANYL CITRATE (PF) 100 MCG/2ML IJ SOLN
50.0000 ug | Freq: Once | INTRAMUSCULAR | Status: AC
  Administered 2019-05-04: 50 ug via INTRAVENOUS
  Filled 2019-05-04: qty 2

## 2019-05-04 MED ORDER — HYDROCODONE-ACETAMINOPHEN 5-325 MG PO TABS: 1.0000 | ORAL_TABLET | ORAL | 0 refills | Status: DC | PRN

## 2019-05-04 MED ORDER — HYDROCODONE-ACETAMINOPHEN 5-325 MG PO TABS: 1.0000 | ORAL_TABLET | Freq: Once | ORAL | Status: DC

## 2019-05-04 MED ORDER — FENTANYL CITRATE (PF) 100 MCG/2ML IJ SOLN
50.0000 ug | INTRAMUSCULAR | Status: AC | PRN
  Administered 2019-05-04 (×2): 50 ug via INTRAVENOUS
  Filled 2019-05-04 (×2): qty 2

## 2019-05-04 NOTE — ED Notes (Signed)
Pt has skin tear on right forearm. This RN cleaned and wrapped arm.

## 2019-05-04 NOTE — ED Triage Notes (Signed)
Pt arrives from home.  Pt states she tripped over a stool and fell. Endorses shoulder and arm pain 8/10. Denies LOC

## 2019-05-04 NOTE — ED Provider Notes (Signed)
[facility] Provider Note   CSN: [id number] Arrival date & time: 05/04/19  1542     History   Chief Complaint Chief Complaint  Patient presents with   Fall    HPI [name] is a 85 y.o. female with a hx of anxiety, depression, DM, asthma, GERD, non hodgkins lymphoma, & parkinson who presents to the ED via EMS s/p fall shortly PTA with complaints of R shoulder pain.  Patient states that she was walking through her living room, she tripped over a stool on the ground, and fell onto her right shoulder.  She states she hit her head but did not lose consciousness.  She is complaining of pain primarily to the right shoulder that is severe, worse with movement, no alleviating factors.  No intervention prior to arrival.  She is also having pain to the neck/upper back area.  Denies numbness, tingling, or weakness.  Patient is right-hand dominant.  She denies utilizing blood thinners.     HPI  Past Medical History:  Diagnosis Date   Anxiety    Asthma    Depression    Diabetes mellitus without complication ([place])    [id number].Marland KitchenMarland Kitchenpt denies   GERD (gastroesophageal reflux disease)    Hearing loss    NHL (non-Hodgkin's lymphoma) (HCC)    nhl dx 9/04 breast ca dx1/12   Parkinson disease [facility])     Patient Active Problem List   Diagnosis Date Noted   GIB (gastrointestinal bleeding) 02/03/2017   CAP (community acquired pneumonia) 04/13/2016   PNA (pneumonia) 04/13/2016   Murmur 09/02/2015   Parkinson disease ([place]) 09/02/2015   Bilateral carotid bruits 09/02/2015   Confusion 08/07/2015   Acute encephalopathy 08/07/2015   UTI (lower urinary tract infection) 08/07/2015   Acute kidney failure ([place]) 08/07/2015   Paralysis agitans ([place]) 09/19/2012   Lymphoma ([place]) 07/03/2011   Malignant neoplasm of female breast ([place]) 07/03/2011   Abnormal CT of the chest 03/13/2011   HYPERTENSION, BENIGN 01/01/2009   EDEMA 01/01/2009      Past Surgical History:  Procedure Laterality Date   Ba-HA Ear implant     ESOPHAGOGASTRODUODENOSCOPY N/A 02/04/2017   Procedure: ESOPHAGOGASTRODUODENOSCOPY (EGD);  Surgeon: Arta Silence, MD;  Location: [facility];  Service: Endoscopy;  Laterality: N/A;   MASTECTOMY  2 /8/ 12   bilateral   MASTECTOMY       OB History   No obstetric history on file.      Home Medications    Prior to Admission medications   Medication Sig Start Date End Date Taking? Authorizing Provider  acetaminophen (TYLENOL) 325 MG tablet Take 650 mg by mouth every 6 (six) hours as needed for mild pain, moderate pain, fever or headache.     [provider]  acyclovir (ZOVIRAX) 400 MG tablet Take 1 tablet (400 mg total) by mouth 2 (two) times daily. 03/14/19   [name], MD  Calcium Carbonate (CALCIUM-CARB 600 PO) Take 600 mg by mouth daily.    [provider]  carbidopa-levodopa (SINEMET IR) 25-100 MG tablet TAKE 1 TABLET BY MOUTH EVERY MORNING, 1 TABLET BY MOUTH IN THE AFTERNOON, AND 2 TABLETS BY MOUTH IN THE EVENING 03/13/19   Tat, Eustace Quail, DO  cholecalciferol (VITAMIN D) 1000 units tablet Take 1,000 Units by mouth every evening.     [provider]  donepezil (ARICEPT) 10 MG tablet TAKE 1 TABLET BY MOUTH DAILY 04/04/18   Tat, Eustace Quail, DO  entacapone (COMTAN) 200 MG tablet  TAKE 1 TABLET BY MOUTH 3 TIMES DAILY WITH SINEMET 04/18/18   Tat, Eustace Quail, DO  Fluticasone-Salmeterol (ADVAIR) 250-50 MCG/DOSE AEPB Inhale 1 puff into the lungs 2 (two) times daily.    [provider]  Multiple Vitamins-Minerals (MULTIVITAMIN WITH MINERALS) tablet Take 1 tablet by mouth daily with breakfast.     [provider]  sertraline (ZOLOFT) 100 MG tablet Take 1 tablet (100 mg total) by mouth daily. 03/04/18   Tat, Eustace Quail, DO    Family History Family History  Problem Relation Age of Onset   Lung cancer Father    Cancer Father        lung   Cancer Mother      Social History Social History   Tobacco Use   Smoking status: Never Smoker   Smokeless tobacco: Never Used   Tobacco comment: husband wsa smoker  Substance Use Topics   Alcohol use: No    Comment: no   Drug use: No     Allergies   Azithromycin and Tramadol   Review of Systems Review of Systems  Constitutional: Negative for chills and fever.  Eyes: Negative for visual disturbance.  Respiratory: Negative for shortness of breath.   Cardiovascular: Negative for chest pain.  Gastrointestinal: Negative for abdominal pain and vomiting.  Musculoskeletal: Positive for arthralgias, back pain and neck pain.  Neurological: Negative for weakness and numbness.  All other systems reviewed and are negative.    Physical Exam Updated Vital Signs BP (!) 158/92    Pulse 88    Temp 98 F (36.7 C) (Oral)    Resp 18    Ht 5\' 5"  (1.651 m)    Wt 52 kg    LMP  (LMP Unknown)    SpO2 99%    BMI 19.08 kg/m   Physical Exam Vitals signs and nursing note reviewed.  Constitutional:      General: She is in acute distress (mild appears uncomfortable).     Appearance: She is well-developed.  HENT:     Head: Normocephalic and atraumatic. No raccoon eyes or Battle's sign.     Right Ear: No hemotympanum.     Left Ear: No hemotympanum.  Eyes:     General:        Right eye: No discharge.        Left eye: No discharge.     Conjunctiva/sclera: Conjunctivae normal.     Pupils: Pupils are equal, round, and reactive to light.  Neck:     Musculoskeletal: Spinous process tenderness (Diffuse midline without palpable step-off.  No point/focal vertebral tenderness.) present.  Cardiovascular:     Rate and Rhythm: Normal rate and regular rhythm.     Pulses:          Radial pulses are 2+ on the right side and 2+ on the left side.     Heart sounds: No murmur.  Pulmonary:     Effort: Pulmonary effort is normal. No respiratory distress.     Breath sounds: Normal breath sounds. No wheezing, rhonchi or  rales.  Chest:     Chest wall: Tenderness (Mild to right anterior chest wall without overlying skin changes or palpable crepitus.) present.  Abdominal:     General: There is no distension.     Palpations: Abdomen is soft.     Tenderness: There is no abdominal tenderness.  Musculoskeletal:     Comments: Upper extremities: Patient is in RUE sling by EMS.  She has a V-shaped skin tear to  the posterior right elbow.  No active bleeding or appreciable foreign bodies.  No deep structure involvement.  Intact active range of motion throughout the left arm as well as to the right wrist and all digits.  Right elbow/shoulder range of motion is significantly limited secondary to pain. Patient is gender to the R Norris, humerus, diffuse elbow including olecranon medial/lateral epicondyles & to the radial head with tenderness to the proximal 2/3rd of the forearm. UEs are otherwise nontender.  Back: Diffuse upper thoracic midline tenderness w/o palpable step off/crepitus. No lumbar spine tenderness.  Lower extremities: Intact AROM, No point/focal bony tenderness. No pelvic tenderness or palpable instability.   Skin:    General: Skin is warm and dry.     Findings: No rash.  Neurological:     Mental Status: She is alert.     Comments: Alert.  Clear speech.  CN III through XII grossly intact.  Sensation grossly intact bilateral upper and lower extremities.  5 out of 5 symmetric grip strength.  5 out of 5 strength plantar dorsiflexion bilaterally.  Able to lift each of her lower extremities off of the bed and hold them raised without difficulty.  Ambulation deferred on initial assessment.  Psychiatric:        Behavior: Behavior normal.      ED Treatments / Results  Labs (all labs ordered are listed, but only abnormal results are displayed) Labs Reviewed  CBG MONITORING, ED - Abnormal; Notable for the following components:      Result Value   Glucose-Capillary 138 (*)    All other components within normal  limits    EKG None  Radiology Dg Chest 2 View  Result Date: 05/04/2019 CLINICAL DATA:  Fall over a stool. Right shoulder and elbow pain. EXAM: CHEST - 2 VIEW COMPARISON:  Multiple exams, including 04/13/2016, and prior CT chest from 03/03/2011. FINDINGS: Atherosclerotic calcification of the aortic arch. There is also dense atherosclerosis of the splenic artery. Bilateral axillary clips. The patient is rotated to the right on today's radiograph, reducing diagnostic sensitivity and specificity. Bony demineralization. Wedge resection clips along the right lung base. Upper normal heart size. Mild interstitial accentuation in both lungs without well-defined airspace opacity. No blunting of the costophrenic angles. Dextroconvex lumbar scoliosis. IMPRESSION: 1. Mild interstitial accentuation in both lungs, without well-defined airspace opacity. 2. Atherosclerotic calcification of the aortic arch. Aortic Atherosclerosis (ICD10-I70.0). 3. Bony demineralization. 4. Wedge resection clips along the right lung base. Electronically Signed   By: Van Clines M.D.   On: 05/04/2019 18:04   Dg Thoracic Spine 2 View  Result Date: 05/04/2019 CLINICAL DATA:  Fall over stool today.  Proximal humeral fracture. EXAM: THORACIC SPINE 2 VIEWS COMPARISON:  Chest radiograph 04/13/2016 FINDINGS: Bony demineralization which reduces sensitivity and specificity. 39 degrees of dextroconvex scoliosis between T12 and L3. Atherosclerotic calcification of the aortic arch. Atherosclerotic calcification of the abdominal aorta and splenic artery. No definite compression fractures identified. No acute subluxation is identified. IMPRESSION: 1. No acute fracture or acute subluxation is identified. 2. Bony demineralization. 3. Dextroconvex scoliosis between T12 and L3. 4.  Aortic Atherosclerosis (ICD10-I70.0). Electronically Signed   By: Van Clines M.D.   On: 05/04/2019 18:16   Dg Shoulder Right  Result Date: 05/04/2019 CLINICAL  DATA:  Right shoulder pain after a fall. EXAM: RIGHT SHOULDER - 2+ VIEW COMPARISON:  None. FINDINGS: Surgical neck fracture of the right humerus noted. Minimal displacement. Bony demineralization. IMPRESSION: 1. Right humeral neck fracture, with minimal displacement. Electronically  Signed   By: Van Clines M.D.   On: 05/04/2019 18:13   Dg Elbow Complete Right  Result Date: 05/04/2019 CLINICAL DATA:  Fall over stool, elbow pain. EXAM: RIGHT ELBOW - COMPLETE 3+ VIEW COMPARISON:  None. FINDINGS: No elbow joint effusion identified. Bandaging overlying the olecranon with mild soft tissue swelling in this vicinity. Ill definition of the supinator fat pad, although this may be due to the patient's age and relative regional muscular atrophy rather than necessarily being suggestive of occult radial head fracture. The anterior humeral line is at the junction of the mid and anterior thirds of the capitellum. IMPRESSION: 1. Soft tissue swelling posteriorly along the olecranon. No definite fracture is identified. 2. There is no elbow effusion, but the supinator fat pad is difficult to visualize. Although this can be an indicator of occult injury such as occult radial head fracture, in this patient's case it may simply be due to atrophy of the regional musculature. Electronically Signed   By: Van Clines M.D.   On: 05/04/2019 18:07   Dg Forearm Right  Result Date: 05/04/2019 CLINICAL DATA:  Fall over stool, pain in the right upper extremity. EXAM: RIGHT FOREARM - 2 VIEW COMPARISON:  None. FINDINGS: Atherosclerotic vascular calcification. Bony demineralization. Mild soft tissue swelling and bandage aching along the olecranon region. No well-defined fracture. IMPRESSION: 1. Mild soft tissue swelling along the olecranon region. 2. Bony demineralization. 3. Atherosclerotic vascular calcification. 4. No fracture identified Electronically Signed   By: Van Clines M.D.   On: 05/04/2019 18:08   Ct Head Wo  Contrast  Result Date: 05/04/2019 CLINICAL DATA:  Fall over a stool, right proximal humeral fracture. EXAM: CT HEAD WITHOUT CONTRAST CT CERVICAL SPINE WITHOUT CONTRAST TECHNIQUE: Multidetector CT imaging of the head and cervical spine was performed following the standard protocol without intravenous contrast. Multiplanar CT image reconstructions of the cervical spine were also generated. COMPARISON:  08/03/2018 CT head and 09/20/2012 cervical spine CT FINDINGS: CT HEAD FINDINGS Brain: 0.5 by 0.4 cm lacunar infarct or dilated perivascular space along the inferior margin of the lentiform nucleus. Periventricular white matter and corona radiata hypodensities favor chronic ischemic microvascular white matter disease. Otherwise, the brainstem, cerebellum, cerebral peduncles, thalamus, basal ganglia, basilar cisterns, and ventricular system appear within normal limits. No intracranial hemorrhage, mass lesion, or acute CVA. Vascular: There is atherosclerotic calcification of the cavernous carotid arteries bilaterally. Skull: Unremarkable Sinuses/Orbits: Right mastoidectomy. Other: No supplemental non-categorized findings. CT CERVICAL SPINE FINDINGS Alignment: No vertebral subluxation is observed. Skull base and vertebrae: Spurring and reduced articular space along the anterior C1-2 articulation. Bilaterally fused facets at C3-4. Considerable loss of intervertebral disc height at all levels between C3 and C7. No cervical spine fracture or acute subluxation. Soft tissues and spinal canal: Atherosclerotic calcification of the common carotid arteries. Disc levels: Osseous foraminal narrowing on the right at C4-5 due to uncinate spurring. 12 no other significant degree of impingement is identified. Upper chest: Biapical pleuroparenchymal scarring. Bandlike density laterally in the right upper lobe on image 82/5, probably from atelectasis or scarring. Other: No supplemental non-categorized findings. IMPRESSION: 1. No acute  intracranial findings or acute cervical spine findings. 2. Periventricular white matter and corona radiata hypodensities favor chronic ischemic microvascular white matter disease. Small lacunar infarct or dilated perivascular space along the inferior margin of the right lentiform nucleus. 3. Cervical spondylosis and degenerative disc disease, with mild right foraminal impingement at C4-5. 4. Atherosclerosis. 5. Right mastoidectomy. Electronically Signed   By: Thayer Jew  Janeece Fitting M.D.   On: 05/04/2019 18:29   Dg Humerus Right  Result Date: 05/04/2019 CLINICAL DATA:  Fall, right shoulder pain EXAM: RIGHT HUMERUS - 2+ VIEW COMPARISON:  Prior chest radiographs including 04/13/2016 FINDINGS: The 2 projections are mildly internally rotated frontal projections of the humerus. Presumably, the patient has motility was limited. A true lateral could not be obtained. The frontal projection is in some degree of internal rotation. There is some sclerosis and apparent ridging tracking along the surgical neck of the humerus raising suspicion for a nondisplaced surgical neck fracture. IMPRESSION: 1. Suspicious appearance for a nondisplaced surgical neck fracture. This could be confirmed with CT if clinically warranted. The patient had some reduced mobility in the shoulder, causing the to obtain projections to be nearly the same. Electronically Signed   By: Van Clines M.D.   On: 05/04/2019 18:11    Procedures Procedures (including critical care time)  Medications Ordered in ED Medications  HYDROcodone-acetaminophen (NORCO/VICODIN) 5-325 MG per tablet 1 tablet (has no administration in time range)  fentaNYL (SUBLIMAZE) injection 50 mcg (50 mcg Intravenous Given 05/04/19 1755)  Tdap (BOOSTRIX) injection 0.5 mL (0.5 mLs Intramuscular Given 05/04/19 1625)  fentaNYL (SUBLIMAZE) injection 50 mcg (50 mcg Intravenous Given 05/04/19 2044)   Hx of tramadol allergy, tolerating fentanyl well, has also tolerated codeine well  in the past.    Initial Impression / Assessment and Plan / ED Course  I have reviewed the triage vital signs and the nursing notes.  Pertinent labs & imaging results that were available during my care of the patient were reviewed by me and considered in my medical decision making (see chart for details).   Patient presents to the emergency department with complaints of right shoulder pain status post mechanical fall.  On arrival she is hypertensive and appears uncomfortable.  EMS is placed a sling to the right upper extremity.  She has a skin tear noted to the posterior right elbow that does not appear to require intervention with sutures, staples, or skin adhesives.  Tdap updated.  Analgesics ordered.  Imaging and locations of concern.  Imaging reviewed: CT head/C-spine negative for acute abnormalities, additional findings as above-no focal neurologic deficits CXR: No fx/dislocation, PTX, or hemothorax-minimal chest wall tenderness, no overlying skin changes, do not feel further imaging with CT is necessary. Tspine: No acute fracture or acute subluxation is identified. Additional findings as above  Right upper extremity x-rays: Notable for right humeral neck fracture with minimal displacement.  There are some questionable findings of the right elbow which could represent occult radial head fracture versus atrophy of regional musculature per radiology.  Plan to discuss with orthopedic surgery.  19:20: CONSULT: Discussed with orthopedic surgeon Dr. Erlinda Hong- recommends sling immobilizer, no need for long arm splint, follow up in clinic in 5-7 days.   Plan follow through as discussed with orthopedics. NVI distally prior to and following sling application. Per nursing patient fell asleep and desaturated some after pain medication- placed on 2L via [place], history of sleep apnea, normalized with being awake and upright.  Will discharge home with Norco to take as needed for pain. Her BP improves some with pain  control, but remains elevated, she and her husband state systolic typically runs in 160s, discussed possible anti-hypertensive medications, risks/benefits & shared decision making, will send home with pain medicines & hold off on anti-hypertensives currently- close PCP follow up for this. I discussed results, treatment plan, need for follow-up, and return precautions with the patient &  her husband. Provided opportunity for questions, patient & her husband confirmed understanding and are in agreement with plan.    Findings and plan of care discussed with supervising physician Dr. Sherry Ruffing who has evaluated patient & is in agreement.   Final Clinical Impressions(s) / ED Diagnoses   Final diagnoses:  Fall, initial encounter  Closed displaced fracture of surgical neck of right humerus, unspecified fracture morphology, initial encounter    ED Discharge Orders         Ordered    HYDROcodone-acetaminophen (NORCO/VICODIN) 5-325 MG tablet  Every 4 hours PRN     05/04/19 2125           Amaryllis Dyke, PA-C 05/04/19 2131    Tegeler, Gwenyth Allegra, MD 05/05/19 [phone]

## 2019-05-04 NOTE — Discharge Instructions (Addendum)
Please read and follow all provided instructions.  You have been seen today for a fall with a right humeral neck fracture.  We have placed you in a sling immobilizer for the fracture.  Please stay in the sling immobilizer at all times until you have followed up with orthopedics.  Please call Dr. Phoebe Sharps office tomorrow to schedule an appointment within 5 to 7 days.  Home care instructions: -- *PRICE in the first 24-48 hours after injury: Protect (with brace, splint, sling),  Rest Ice- Do not apply ice pack directly to your skin, place towel or similar between your skin and ice/ice pack. Apply ice for 20 min, then remove for 40 min while awake Compression- Wear brace, elastic bandage, splint as directed by your provider Elevate - Not applicable.   Medications:  -Norco-this is a narcotic/controlled substance medication that has potential addicting qualities.  We recommend that you take 1-tablets every 4-6 hours as needed for severe pain.  Do not drive or operate heavy machinery when taking this medicine as it can be sedating. Do not drink alcohol or take other sedating medications when taking this medicine for safety reasons.  Keep this out of reach of small children.  Please be aware this medicine has Tylenol in it (325 mg/tab) do not exceed the maximum dose of Tylenol in a day per over the counter recommendations should you decide to supplement with Tylenol over the counter.   We have prescribed you new medication(s) today. Discuss the medications prescribed today with your pharmacist as they can have adverse effects and interactions with your other medicines including over the counter and prescribed medications. Seek medical evaluation if you start to experience new or abnormal symptoms after taking one of these medicines, seek care immediately if you start to experience difficulty breathing, feeling of your throat closing, facial swelling, or rash as these could be indications of a more serious allergic  reaction   Follow-up instructions: Please follow-up with orthopedics as discussed above.   Your imaging also showed some degenerative changes in your spine as well as some small blood flow changes to your brain which do not appear to be new, please discuss this with your primary care provider.  Return instructions:  Please return if your digits or extremity are numb or tingling, appear gray or blue, or you have severe pain (also elevate the extremity and loosen splint or wrap if you were given one) Please return if you have redness or fevers.  Please return to the Emergency Department if you experience worsening symptoms.  Please return if you have any other emergent concerns. Additional Information:  Your vital signs today were: BP (!) 186/104    Pulse 91    Temp 98 F (36.7 C) (Oral)    Resp 19    Ht 5\' 5"  (1.651 m)    Wt 52 kg    LMP  (LMP Unknown)    SpO2 100%    BMI 19.08 kg/m  Your blood pressure is elevated, please be sure to recheck with primary care within 1 to 2 weeks. ---------------

## 2019-05-09 ENCOUNTER — Other Ambulatory Visit: Payer: Self-pay

## 2019-05-09 ENCOUNTER — Ambulatory Visit (INDEPENDENT_AMBULATORY_CARE_PROVIDER_SITE_OTHER): Payer: Medicare Other

## 2019-05-09 ENCOUNTER — Encounter: Payer: Self-pay | Admitting: Orthopaedic Surgery

## 2019-05-09 ENCOUNTER — Ambulatory Visit (INDEPENDENT_AMBULATORY_CARE_PROVIDER_SITE_OTHER): Payer: Medicare Other | Admitting: Orthopaedic Surgery

## 2019-05-09 DIAGNOSIS — M898X5 Other specified disorders of bone, thigh: Secondary | ICD-10-CM

## 2019-05-09 NOTE — Progress Notes (Signed)
Office Visit Note   Patient: [name]           Date of Birth: 1928/02/17           MRN: [id number] Visit Date: 05/09/2019              Requested by: [name], MD [place],  [place] [address] PCP: [name], MD   Assessment & Plan: Visit Diagnoses:  1. Pain in femur     Plan: Impression is nondisplaced right proximal humerus fracture and right thigh pain.  X-rays are negative for femur fracture.  We will continue mobilization with the sling for another 2 weeks.  Follow-up at that time with two-view x-rays of the right shoulder.  She is to remain nonweightbearing to the right upper extremity.  We did give her a new sling that fits better.  She will treat the elbow abrasion with Neosporin and Band-Aid.  Follow-Up Instructions: Return in about 2 weeks (around 05/23/2019).   Orders:  Orders Placed This Encounter  Procedures  . XR FEMUR, MIN 2 VIEWS RIGHT   No orders of the defined types were placed in this encounter.     Procedures: No procedures performed   Clinical Data: No additional findings.   Subjective: Chief Complaint  Patient presents with  . Right Leg - Pain  . Right Shoulder - Fracture    [name] comes in today for evaluation of a recent right proximal humerus fracture and right leg pain.  She is accompanied by her husband.  She was evaluated in the ER about 5 days ago for a mechanical fall in which she suffered a nondisplaced proximal humerus fracture.  Fortunately she is able to still weight-bear on her right leg.  She is wearing a sling.  Denies any numbness and tingling.   Review of Systems  Constitutional: Negative.   HENT: Negative.   Eyes: Negative.   Respiratory: Negative.   Cardiovascular: Negative.   Endocrine: Negative.   Musculoskeletal: Negative.   Neurological: Negative.   Hematological: Negative.   Psychiatric/Behavioral: Negative.   All other systems reviewed and are negative.    Objective: Vital Signs: LMP  (LMP Unknown)   Physical Exam Vitals signs and nursing note reviewed.  Constitutional:      Appearance: She is well-developed.  HENT:     Head: Normocephalic and atraumatic.  Neck:     Musculoskeletal: Neck supple.  Pulmonary:     Effort: Pulmonary effort is normal.  Abdominal:     Palpations: Abdomen is soft.  Skin:    General: Skin is warm.     Capillary Refill: Capillary refill takes less than 2 seconds.  Neurological:     Mental Status: She is alert and oriented to person, place, and time.  Psychiatric:        Behavior: Behavior normal.        Thought Content: Thought content normal.        Judgment: Judgment normal.     Ortho Exam Right upper extremity exam shows no neurovascular compromise.  Range of motion not tested secondary to pain and fracture.  Right lower extremity exam is benign.  She has no pain with movement of her palpation or range of motion of the hip. Specialty Comments:  No specialty comments available.  Imaging: Xr Femur, Min 2 Views Right  Result Date: 05/09/2019 No acute or structural abnormalities    PMFS History: Patient Active Problem List   Diagnosis Date Noted  .  GIB (gastrointestinal bleeding) 02/03/2017  . CAP (community acquired pneumonia) 04/13/2016  . PNA (pneumonia) 04/13/2016  . Murmur 09/02/2015  . Parkinson disease ([place]) 09/02/2015  . Bilateral carotid bruits 09/02/2015  . Confusion 08/07/2015  . Acute encephalopathy 08/07/2015  . UTI (lower urinary tract infection) 08/07/2015  . Acute kidney failure ([place]) 08/07/2015  . Paralysis agitans ([place]) 09/19/2012  . Lymphoma ([place]) 07/03/2011  . Malignant neoplasm of female breast ([place]) 07/03/2011  . Abnormal CT of the chest 03/13/2011  . HYPERTENSION, BENIGN 01/01/2009  . EDEMA 01/01/2009   Past Medical History:  Diagnosis Date  . Anxiety   . Asthma   . Depression   . Diabetes mellitus without complication ([place])    [id number].Marland KitchenMarland Kitchenpt denies   . GERD (gastroesophageal reflux disease)   . Hearing loss   . NHL (non-Hodgkin's lymphoma) ([place])    nhl dx 9/04 breast ca dx1/12  . Parkinson disease ([place])     Family History  Problem Relation Age of Onset  . Lung cancer Father   . Cancer Father        lung  . Cancer Mother     Past Surgical History:  Procedure Laterality Date  . Ba-HA Ear implant    . ESOPHAGOGASTRODUODENOSCOPY N/A 02/04/2017   Procedure: ESOPHAGOGASTRODUODENOSCOPY (EGD);  Surgeon: Arta Silence, MD;  Location: [facility];  Service: Endoscopy;  Laterality: N/A;  . MASTECTOMY  2 /8/ 12   bilateral  . MASTECTOMY     Social History   Occupational History  . Occupation: retired    Fish farm manager: RETIRED    Comment: [employer]  Tobacco Use  . Smoking status: Never Smoker  . Smokeless tobacco: Never Used  . Tobacco comment: husband wsa smoker  Substance and Sexual Activity  . Alcohol use: No    Comment: no  . Drug use: No  . Sexual activity: Not on file

## 2019-05-12 ENCOUNTER — Telehealth: Payer: Self-pay | Admitting: Neurology

## 2019-05-12 NOTE — Telephone Encounter (Signed)
Pt daughter in law called and would like to speak to someone about get help with care for her. Her husband is the primary  care taker and it is getting to much for him to do. She states the patient fell and broke her shoulder. Please call   She states that if you call her back Monday call after 3:00 please

## 2019-05-12 NOTE — Telephone Encounter (Signed)
Called patient daughter Lynelle Smoke left message to call office back or will call her back on Monday to discuss

## 2019-05-16 NOTE — Telephone Encounter (Signed)
Called patient daughter tammy again no answer left message to call office back regarding patient.

## 2019-05-16 NOTE — Telephone Encounter (Signed)
ALL [facility] DPR SIGNED 06/16/18-OK TO TALK TO (SPOUSE) [name], (SON) [name] JR, AND ([place]) [name].

## 2019-05-16 NOTE — Telephone Encounter (Signed)
Any of these are viable resources although palliative likely isn't going to provide what they need. Judson Roch may help them navigate if they need. Please make sure daughter is on release.

## 2019-05-16 NOTE — Telephone Encounter (Signed)
Spoke with patient daughter a home health aide for a couples hours in morning and after noon Getting her ready for the day and to bed night And help administer medications.  Pt daughter is requesting some resources to getting her some help at home.  She has called [employer], [employer] for out of pocket cost.  She is also aware of long term care insurance   [facility]- she said she spoke with someone there about palliative care to see if she qualifies for their program- [facility] program. Requesting your opinion on this as well.  Please advise

## 2019-05-16 NOTE — Telephone Encounter (Signed)
Spoke with [name] she was informed of provider response. And that I will forward message to Judson Roch to help navigate with more resources.  She is aware Judson Roch will be out of the office but will call when she is back.

## 2019-05-18 DIAGNOSIS — I10 Essential (primary) hypertension: Secondary | ICD-10-CM | POA: Diagnosis not present

## 2019-05-18 DIAGNOSIS — R413 Other amnesia: Secondary | ICD-10-CM | POA: Diagnosis not present

## 2019-05-18 DIAGNOSIS — G20 Parkinson's disease: Secondary | ICD-10-CM | POA: Diagnosis not present

## 2019-05-18 DIAGNOSIS — Z853 Personal history of malignant neoplasm of breast: Secondary | ICD-10-CM | POA: Diagnosis not present

## 2019-05-18 DIAGNOSIS — F419 Anxiety disorder, unspecified: Secondary | ICD-10-CM | POA: Diagnosis not present

## 2019-05-18 DIAGNOSIS — S42294D Other nondisplaced fracture of upper end of right humerus, subsequent encounter for fracture with routine healing: Secondary | ICD-10-CM | POA: Diagnosis not present

## 2019-05-18 DIAGNOSIS — Z8572 Personal history of non-Hodgkin lymphomas: Secondary | ICD-10-CM | POA: Diagnosis not present

## 2019-05-18 DIAGNOSIS — E7800 Pure hypercholesterolemia, unspecified: Secondary | ICD-10-CM | POA: Diagnosis not present

## 2019-05-23 ENCOUNTER — Ambulatory Visit: Payer: Medicare Other | Admitting: Orthopaedic Surgery

## 2019-05-25 ENCOUNTER — Other Ambulatory Visit: Payer: Self-pay

## 2019-05-25 ENCOUNTER — Other Ambulatory Visit: Payer: Medicare Other

## 2019-05-25 DIAGNOSIS — Z515 Encounter for palliative care: Secondary | ICD-10-CM

## 2019-05-25 NOTE — Progress Notes (Addendum)
COMMUNITY PALLIATIVE CARE SW NOTE  PATIENT NAME: [name] DOB: 04/03/1928 MRN: [id number]  PRIMARY CARE PROVIDER: Harris, William, MD  RESPONSIBLE PARTY:  Acct ID - Guarantor Home Phone Work Phone Relationship Acct Type  [id number] - [place],* [phone]  Self P/F     [address], [place], [place] [address]     PLAN OF CARE and INTERVENTIONS:             1. GOALS OF CARE/ ADVANCE CARE PLANNING:  Patient is a DNR, needs form. SW requested DNR form from [place], NP. Patient has Living Will complete. HCPOA is [name] and son, Andrew but [name] is planning to switch this to another son, Joey that is local. Goal is to keep patient at home and cared for. 2. SOCIAL/EMOTIONAL/SPIRITUAL ASSESSMENT/ INTERVENTIONS:  SW and RN met with patient and [name] (patient's husband) in the home. [name] provided brief medical history. Patient was sleeping in her chair for most of the visit. Patient appeared comfortable. [name] notes minimal pain. Patient and [name] have been married for over 50 years. Moved to [place] following retirement. Patient and [name] have five sons, and seven grandchildren. Joey and his wife, [name] are located in [place],  and the rest of the family lives in other states but communicates often via phone. 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 Patient is eating well, and drinks three Ensures a day. Patient is sleeping well, and sleeps on and off throughout the day.  3. PATIENT/CAREGIVER EDUCATION/ COPING:  Patient is confused at times per [name]. Patient seems to be coping well. Patient's family is supportive. Patient enjoys watching television with [name].  4. PERSONAL EMERGENCY PLAN:  [name] will call 9-1-1 for emergencies. 5. COMMUNITY RESOURCES COORDINATION/ HEALTH CARE NAVIGATION:  Patient has a follow-up with orthopaedic surgeon next week. Patient has a private aide that visits four days week to help with patient's shower. [name] manages all of patient's care.  No concerns. 6. FINANCIAL/LEGAL CONCERNS/INTERVENTIONS:  None.      SOCIAL HX:  Social History   Tobacco Use  . Smoking status: Never Smoker  . Smokeless tobacco: Never Used  . Tobacco comment: husband wsa smoker  Substance Use Topics  . Alcohol use: No    Comment: no    CODE STATUS:   Code Status: Prior (DNR) ADVANCED DIRECTIVES: Y MOST FORM COMPLETE:  No. HOSPICE EDUCATION PROVIDED: Yes, discussed hospice versus palliative.  PPS: Patient is dependent of most ADLs. Prior to fall, patient was ambulating with a walker but is now transferring to wheelchair.   I spent 45 minutes with patient/family, from 11:30a-12:15p providing education, support and consultation.    , LCSW 

## 2019-05-25 NOTE — Progress Notes (Signed)
PATIENT NAME: [name] DOB: [birth year] MRN: [id number]  PRIMARY CARE PROVIDER: [name], MD  RESPONSIBLE PARTY:  Acct ID - Guarantor Home Phone Work Phone Relationship Acct Type  [id number] [facility],* [phone]  Self P/F     [place], [name], [name] [id number]    PLAN OF CARE and INTERVENTIONS:               1.  GOALS OF CARE/ ADVANCE CARE PLANNING: Remain at home with husband and husband hopeful right fractured hemerous heals without patient requiring surgery.                2.  PATIENT/CAREGIVER EDUCATION:  Educatin on hospice verses palliative care services, reviewed meds, support                3.  DISEASE STATUS: SW an RN made joint home visit.  SW and RN met with patients husband [name] who reports patient suffered a fall approximately 3 weeks ago. Patient has a closed right fracture of her right humerus.  Husband states patient did not have surgery since fracture was closed and patients right arm was  immobilized in sling. Husband reports patient to see surgeon next Wednesday. Husband is hopeful that fracture has healed. Husband reports patient has not complained of pain and has not has to use pain medications.  Patient's past medical history includes but not limited to aortic stenosis, Parkinson's, history of breast cancer with bilateral mastectomies and Lymphoma. Husband reports patients memory is worsening. Patient sleeping soundly in chair during visit. Nurse did not check patients blood pressure since patient was sleeping soundly. Patient did wake when RN and SW were leaving home. Patient knows family members and was pleasant to RN and SW.  Patient continues to take Carodopa-Levodopa and Comtan for Parkinson's. Husband has a private caregiver to come in the home 4 times a week in the mornings and husband puts patient to bed every night. Husband reports patients appetite is good but patient does not eat a lot at one time. Husband denies patient having any difficulty  swallowing. Husband reports patient drinks 2-3 Ensure Plus everyday. Patients current weight is 116 lb. Patients BMI currently 20.5 with patients height 5 3. Husband reports patient is approximately awake 7.5 hours per day.  Husband sleeps patient sleeps well at night. Patient wears pull-ups for urinary incontinence. Patient has no open areas of skin breakdown. Husband reports patient was able to ambulate with her walker prior to suffering fall and substaining fracture. Husband is mainly using  Wheelchair for patient due to patient not being able to hold onto walker (arm in sling.)  Husband in agreement with palliative care services and wishes to look over  palliative care information folder. Husband in agreement with palliative care calling next month and making home visit. Husband verbalizes appreciation for visit. Nurse reviewed patient's medications with husband and provided information on fall precautions.    HISTORY OF PRESENT ILLNESS:  Patient is a 85 year old female who resides in her home with husband [name].  Patients husband [name] agreeable to palliative care services for patient.  Patient will be seen monthly and prn.   CODE STATUS: DNR / SW to request DNR for patient  ADVANCED DIRECTIVES: Y MOST FORM: No PPS: 30%   PHYSICAL EXAM:   VITALS: Today's Vitals   05/25/19 1130  Pulse: 76  Resp: 16  Temp: 98.1 F (36.7 C)  TempSrc: Temporal  SpO2: 98%  Weight: 116 lb (52.6 kg)  Height: 5' 3"  (1.6 m)  PainSc: 0-No pain    LUNGS: clear to auscultation  CARDIAC: Cor RRR  EXTREMITIES: none edema SKIN: Skin color, texture, turgor normal. No rashes or lesions  NEURO: positive for coordination problems, gait problems, memory problems and weakness       [name] Simmer, RN

## 2019-05-30 DIAGNOSIS — Z23 Encounter for immunization: Secondary | ICD-10-CM | POA: Diagnosis not present

## 2019-05-31 ENCOUNTER — Ambulatory Visit (INDEPENDENT_AMBULATORY_CARE_PROVIDER_SITE_OTHER): Payer: Medicare Other

## 2019-05-31 ENCOUNTER — Encounter: Payer: Self-pay | Admitting: Orthopaedic Surgery

## 2019-05-31 ENCOUNTER — Ambulatory Visit (INDEPENDENT_AMBULATORY_CARE_PROVIDER_SITE_OTHER): Payer: Medicare Other | Admitting: Orthopaedic Surgery

## 2019-05-31 DIAGNOSIS — S42201A Unspecified fracture of upper end of right humerus, initial encounter for closed fracture: Secondary | ICD-10-CM | POA: Insufficient documentation

## 2019-05-31 DIAGNOSIS — S42291A Other displaced fracture of upper end of right humerus, initial encounter for closed fracture: Secondary | ICD-10-CM

## 2019-05-31 NOTE — Progress Notes (Signed)
Office Visit Note   Patient: [name]           Date of Birth: 03-15-1928           MRN: [id number] Visit Date: 05/31/2019              Requested by: [name], MD [place],   [id number] PCP: [name], MD   Assessment & Plan: Visit Diagnoses:  1. Other closed displaced fracture of proximal end of right humerus, initial encounter     Plan: Impression is 3-1/2 weeks status post right proximal humerus fracture.  We will allow the patient to discontinue the use of her sling at home but she must continue to wear this in public for the next 3 weeks.  She will remain nonweightbearing right upper extremity.  We will allow her to start pendulum swings.  Due to her lack of strength with her lower extremity and the fact that she is primarily homebound, we will set up home health physical therapy for this.  She will follow-up with [place] in 3 weeks time for repeat evaluation and 2 view x-rays of the right shoulder.  Follow-Up Instructions: Return in about 3 weeks (around 06/21/2019).   Orders:  Orders Placed This Encounter  Procedures  . XR Shoulder Right   No orders of the defined types were placed in this encounter.     Procedures: No procedures performed   Clinical Data: No additional findings.   Subjective: Chief Complaint  Patient presents with  . Right Shoulder - Pain, Follow-up    HPI patient is a pleasant 85 year old female who comes in today proximately 3-1/2 weeks status post right proximal humerus fracture, date of injury 05/04/2019.  She has been compliant wearing her sling.  She has not been complaining of pain according to her husband.  She is previously complaining of right thigh pain as well.  At this point, she denies any pain to her thigh.     Objective: Vital Signs: LMP  (LMP Unknown)     Ortho Exam examination of her right shoulder reveals moderate tenderness along the fracture site.  Full sensation  distally.  Specialty Comments:  No specialty comments available.  Imaging: Xr Shoulder Right  Result Date: 05/31/2019 X-rays demonstrate stable alignment of the fracture with evidence of callus formation    PMFS History: Patient Active Problem List   Diagnosis Date Noted  . Closed fracture of right proximal humerus 05/31/2019  . GIB (gastrointestinal bleeding) 02/03/2017  . CAP (community acquired pneumonia) 04/13/2016  . PNA (pneumonia) 04/13/2016  . Murmur 09/02/2015  . Parkinson disease ([place]) 09/02/2015  . Bilateral carotid bruits 09/02/2015  . Confusion 08/07/2015  . Acute encephalopathy 08/07/2015  . UTI (lower urinary tract infection) 08/07/2015  . Acute kidney failure ([place]) 08/07/2015  . Paralysis agitans ([place]) 09/19/2012  . Lymphoma ([place]) 07/03/2011  . Malignant neoplasm of female breast ([place]) 07/03/2011  . Abnormal CT of the chest 03/13/2011  . HYPERTENSION, BENIGN 01/01/2009  . EDEMA 01/01/2009   Past Medical History:  Diagnosis Date  . Anxiety   . Asthma   . Depression   . Diabetes mellitus without complication ([place])    [id number].Marland KitchenMarland Kitchenpt denies  . GERD (gastroesophageal reflux disease)   . Hearing loss   . NHL (non-Hodgkin's lymphoma) ([place])    nhl dx 9/04 breast ca dx1/12  . Parkinson disease ([place])     Family History  Problem Relation Age of Onset  . Lung  cancer Father   . Cancer Father        lung  . Cancer Mother     Past Surgical History:  Procedure Laterality Date  . Ba-HA Ear implant    . ESOPHAGOGASTRODUODENOSCOPY N/A 02/04/2017   Procedure: ESOPHAGOGASTRODUODENOSCOPY (EGD);  Surgeon: Arta Silence, MD;  Location: [facility];  Service: Endoscopy;  Laterality: N/A;  . MASTECTOMY  2 /8/ 12   bilateral  . MASTECTOMY     Social History   Occupational History  . Occupation: retired    Fish farm manager: RETIRED    Comment: [employer]  Tobacco Use  . Smoking status: Never Smoker  . Smokeless tobacco: Never Used  . Tobacco comment: husband wsa  smoker  Substance and Sexual Activity  . Alcohol use: No    Comment: no  . Drug use: No  . Sexual activity: Not on file

## 2019-06-01 ENCOUNTER — Telehealth: Payer: Self-pay | Admitting: Orthopaedic Surgery

## 2019-06-01 NOTE — Telephone Encounter (Signed)
Received voicemail message from Nattaly Musgrove asking if patient need to schedule another appointment? The number to contact [name] is [phone]

## 2019-06-01 NOTE — Telephone Encounter (Signed)
Per last note from yesterday. Patient should return in 3 weeks.

## 2019-06-01 NOTE — Telephone Encounter (Signed)
Patient's daughter in law called wanting to know if Dr. Erlinda Hong would send an order for the patient to have a [place] aid to help out with daily activities.  CB#[phone].  Thank you.

## 2019-06-01 NOTE — Telephone Encounter (Signed)
See message below °

## 2019-06-02 ENCOUNTER — Other Ambulatory Visit: Payer: Self-pay | Admitting: Orthopaedic Surgery

## 2019-06-02 DIAGNOSIS — S42291A Other displaced fracture of upper end of right humerus, initial encounter for closed fracture: Secondary | ICD-10-CM

## 2019-06-02 NOTE — Telephone Encounter (Signed)
Yes that's fine.  Let's order one for her.  Thanks.

## 2019-06-02 NOTE — Telephone Encounter (Signed)
Per last note from yesterday. Patient should return in 3 weeks.

## 2019-06-02 NOTE — Telephone Encounter (Signed)
Order has been faxed to [place] with [facility].

## 2019-06-14 ENCOUNTER — Telehealth: Payer: Self-pay

## 2019-06-14 DIAGNOSIS — S42201D Unspecified fracture of upper end of right humerus, subsequent encounter for fracture with routine healing: Secondary | ICD-10-CM | POA: Diagnosis not present

## 2019-06-14 DIAGNOSIS — Z9181 History of falling: Secondary | ICD-10-CM | POA: Diagnosis not present

## 2019-06-14 DIAGNOSIS — G20 Parkinson's disease: Secondary | ICD-10-CM | POA: Diagnosis not present

## 2019-06-14 NOTE — Telephone Encounter (Signed)
Telephone call to schedule palliative care visit.  Patients husband in agreement with home visit on 06-20-19 at 11:00 AM.

## 2019-06-15 ENCOUNTER — Telehealth: Payer: Self-pay | Admitting: Orthopaedic Surgery

## 2019-06-15 NOTE — Telephone Encounter (Signed)
Called and LMOM approving orders.

## 2019-06-15 NOTE — Telephone Encounter (Signed)
Received voicemail message from Gennaro Africa (PT) with [facility] needing verbal orders for HHPT 1 Wk 1 and Twice a week for 8 weeks. The number to contact Anda Kraft is  [phone]

## 2019-06-19 DIAGNOSIS — G20 Parkinson's disease: Secondary | ICD-10-CM | POA: Diagnosis not present

## 2019-06-19 DIAGNOSIS — S42201D Unspecified fracture of upper end of right humerus, subsequent encounter for fracture with routine healing: Secondary | ICD-10-CM | POA: Diagnosis not present

## 2019-06-19 DIAGNOSIS — Z9181 History of falling: Secondary | ICD-10-CM | POA: Diagnosis not present

## 2019-06-20 ENCOUNTER — Other Ambulatory Visit: Payer: Medicare Other

## 2019-06-20 ENCOUNTER — Other Ambulatory Visit: Payer: Self-pay

## 2019-06-20 ENCOUNTER — Telehealth: Payer: Self-pay | Admitting: Oncology

## 2019-06-20 NOTE — Telephone Encounter (Signed)
Called patient per providers request, left a voicemail. °

## 2019-06-21 ENCOUNTER — Encounter: Payer: Self-pay | Admitting: Orthopaedic Surgery

## 2019-06-21 ENCOUNTER — Other Ambulatory Visit: Payer: Self-pay

## 2019-06-21 ENCOUNTER — Ambulatory Visit (INDEPENDENT_AMBULATORY_CARE_PROVIDER_SITE_OTHER): Payer: Medicare Other | Admitting: Orthopaedic Surgery

## 2019-06-21 ENCOUNTER — Ambulatory Visit (INDEPENDENT_AMBULATORY_CARE_PROVIDER_SITE_OTHER): Payer: Medicare Other

## 2019-06-21 DIAGNOSIS — S42291A Other displaced fracture of upper end of right humerus, initial encounter for closed fracture: Secondary | ICD-10-CM

## 2019-06-21 NOTE — Progress Notes (Signed)
Office Visit Note   Patient: [name]           Date of Birth: [birth year]           MRN: [id number] Visit Date: 06/21/2019              Requested by: [name], MD [place],  [place] [address] PCP: [name], MD   Assessment & Plan: Visit Diagnoses:  1. Other closed displaced fracture of proximal end of right humerus, initial encounter     Plan: Impression is 7 weeks status post right proximal humerus fracture.  Patient is healing nicely.  She will advance with activity as tolerated.  She will continue with her home health physical therapy.  She will follow-up with [place] in 6 weeks time for repeat evaluation and 3 view x-rays of right shoulder.  Call with concerns or questions in meantime.  Follow-Up Instructions: Return in about 6 weeks (around 08/02/2019).   Orders:  Orders Placed This Encounter  Procedures  . XR Shoulder Right   No orders of the defined types were placed in this encounter.     Procedures: No procedures performed   Clinical Data: No additional findings.   Subjective: Chief Complaint  Patient presents with  . Right Shoulder - Pain    HPI patient is a pleasant 85 year old female with history of dementia who comes in today 7 weeks status post right proximal humerus fracture.  She is here with her husband.  sHe has been doing well.  She has been getting home health physical therapy where she is making progress.  She has minimal pain.  She is taking Tylenol as needed.    Objective: Vital Signs: LMP  (LMP Unknown)    Ortho Exam examination of her right shoulder reveals mild tenderness to the fracture site.  She has approximately 50% range of motion.  She is neurovascularly intact distally.  Specialty Comments:  No specialty comments available.  Imaging: Xr Shoulder Right  Result Date: 06/21/2019 X-rays demonstrate a healing proximal humerus fracture with significant evidence of bony consolidation     PMFS History: Patient Active Problem List   Diagnosis Date Noted  . Closed fracture of right proximal humerus 05/31/2019  . GIB (gastrointestinal bleeding) 02/03/2017  . CAP (community acquired pneumonia) 04/13/2016  . PNA (pneumonia) 04/13/2016  . Murmur 09/02/2015  . Parkinson disease ([place]) 09/02/2015  . Bilateral carotid bruits 09/02/2015  . Confusion 08/07/2015  . Acute encephalopathy 08/07/2015  . UTI (lower urinary tract infection) 08/07/2015  . Acute kidney failure ([place]) 08/07/2015  . Paralysis agitans ([place]) 09/19/2012  . Lymphoma ([place]) 07/03/2011  . Malignant neoplasm of female breast ([place]) 07/03/2011  . Abnormal CT of the chest 03/13/2011  . HYPERTENSION, BENIGN 01/01/2009  . EDEMA 01/01/2009   Past Medical History:  Diagnosis Date  . Anxiety   . Asthma   . Depression   . Diabetes mellitus without complication ([place])    [id number].Marland KitchenMarland Kitchenpt denies  . GERD (gastroesophageal reflux disease)   . Hearing loss   . NHL (non-Hodgkin's lymphoma) ([place])    nhl dx 9/04 breast ca dx1/12  . Parkinson disease ([place])     Family History  Problem Relation Age of Onset  . Lung cancer Father   . Cancer Father        lung  . Cancer Mother     Past Surgical History:  Procedure Laterality Date  . Ba-HA Ear implant    . ESOPHAGOGASTRODUODENOSCOPY N/A  02/04/2017   Procedure: ESOPHAGOGASTRODUODENOSCOPY (EGD);  Surgeon: Arta Silence, MD;  Location: [facility];  Service: Endoscopy;  Laterality: N/A;  . MASTECTOMY  2 /8/ 12   bilateral  . MASTECTOMY     Social History   Occupational History  . Occupation: retired    Fish farm manager: RETIRED    Comment: [employer]  Tobacco Use  . Smoking status: Never Smoker  . Smokeless tobacco: Never Used  . Tobacco comment: husband wsa smoker  Substance and Sexual Activity  . Alcohol use: No    Comment: no  . Drug use: No  . Sexual activity: Not on file

## 2019-06-23 DIAGNOSIS — G20 Parkinson's disease: Secondary | ICD-10-CM | POA: Diagnosis not present

## 2019-06-23 DIAGNOSIS — S42201D Unspecified fracture of upper end of right humerus, subsequent encounter for fracture with routine healing: Secondary | ICD-10-CM | POA: Diagnosis not present

## 2019-06-23 DIAGNOSIS — Z9181 History of falling: Secondary | ICD-10-CM | POA: Diagnosis not present

## 2019-06-26 ENCOUNTER — Inpatient Hospital Stay (HOSPITAL_BASED_OUTPATIENT_CLINIC_OR_DEPARTMENT_OTHER): Payer: Medicare Other | Admitting: Oncology

## 2019-06-26 ENCOUNTER — Inpatient Hospital Stay: Payer: Medicare Other | Attending: Oncology

## 2019-06-26 ENCOUNTER — Other Ambulatory Visit: Payer: Self-pay

## 2019-06-26 VITALS — BP 156/80 | HR 93 | Temp 98.3°F | Resp 17 | Ht 63.0 in | Wt 111.5 lb

## 2019-06-26 DIAGNOSIS — Z9225 Personal history of immunosupression therapy: Secondary | ICD-10-CM | POA: Diagnosis not present

## 2019-06-26 DIAGNOSIS — Z9181 History of falling: Secondary | ICD-10-CM | POA: Insufficient documentation

## 2019-06-26 DIAGNOSIS — G20 Parkinson's disease: Secondary | ICD-10-CM | POA: Insufficient documentation

## 2019-06-26 DIAGNOSIS — C8590 Non-Hodgkin lymphoma, unspecified, unspecified site: Secondary | ICD-10-CM | POA: Insufficient documentation

## 2019-06-26 DIAGNOSIS — S42201D Unspecified fracture of upper end of right humerus, subsequent encounter for fracture with routine healing: Secondary | ICD-10-CM | POA: Diagnosis not present

## 2019-06-26 DIAGNOSIS — Z9223 Personal history of estrogen therapy: Secondary | ICD-10-CM | POA: Insufficient documentation

## 2019-06-26 DIAGNOSIS — I10 Essential (primary) hypertension: Secondary | ICD-10-CM | POA: Insufficient documentation

## 2019-06-26 DIAGNOSIS — Z170 Estrogen receptor positive status [ER+]: Secondary | ICD-10-CM | POA: Insufficient documentation

## 2019-06-26 DIAGNOSIS — D693 Immune thrombocytopenic purpura: Secondary | ICD-10-CM | POA: Insufficient documentation

## 2019-06-26 DIAGNOSIS — F0280 Dementia in other diseases classified elsewhere without behavioral disturbance: Secondary | ICD-10-CM | POA: Diagnosis not present

## 2019-06-26 DIAGNOSIS — Z9221 Personal history of antineoplastic chemotherapy: Secondary | ICD-10-CM | POA: Diagnosis not present

## 2019-06-26 DIAGNOSIS — Z853 Personal history of malignant neoplasm of breast: Secondary | ICD-10-CM | POA: Diagnosis not present

## 2019-06-26 LAB — CMP (CANCER CENTER ONLY)
ALT: 14 U/L (ref 0–44)
AST: 19 U/L (ref 15–41)
Albumin: 3.7 g/dL (ref 3.5–5.0)
Alkaline Phosphatase: 112 U/L (ref 38–126)
Anion gap: 9 (ref 5–15)
BUN: 14 mg/dL (ref 8–23)
CO2: 28 mmol/L (ref 22–32)
Calcium: 9.3 mg/dL (ref 8.9–10.3)
Chloride: 104 mmol/L (ref 98–111)
Creatinine: 0.96 mg/dL (ref 0.44–1.00)
GFR, Est AFR Am: >60 mL/min (ref >60)
GFR, Estimated: 52 mL/min — ABNORMAL LOW (ref >60)
Glucose, Bld: 98 mg/dL (ref 70–99)
Potassium: 3.9 mmol/L (ref 3.5–5.1)
Sodium: 141 mmol/L (ref 135–145)
Total Bilirubin: 0.4 mg/dL (ref 0.3–1.2)
Total Protein: 6.8 g/dL (ref 6.5–8.1)

## 2019-06-26 LAB — CBC WITH DIFFERENTIAL (CANCER CENTER ONLY)
Abs Immature Granulocytes: 0.03 10*3/uL (ref 0.00–0.07)
Basophils Absolute: 0.1 10*3/uL (ref 0.0–0.1)
Basophils Relative: 2 %
Eosinophils Absolute: 0.3 10*3/uL (ref 0.0–0.5)
Eosinophils Relative: 7 %
HCT: 37.2 % (ref 36.0–46.0)
Hemoglobin: 12 g/dL (ref 12.0–15.0)
Immature Granulocytes: 1 %
Lymphocytes Relative: 14 %
Lymphs Abs: 0.6 10*3/uL — ABNORMAL LOW (ref 0.7–4.0)
MCH: 27.9 pg (ref 26.0–34.0)
MCHC: 32.3 g/dL (ref 30.0–36.0)
MCV: 86.5 fL (ref 80.0–100.0)
Monocytes Absolute: 0.3 10*3/uL (ref 0.1–1.0)
Monocytes Relative: 6 %
Neutro Abs: 2.8 10*3/uL (ref 1.7–7.7)
Neutrophils Relative %: 70 %
Platelet Count: 158 10*3/uL (ref 150–400)
RBC: 4.3 MIL/uL (ref 3.87–5.11)
RDW: 13.8 % (ref 11.5–15.5)
WBC Count: 4 10*3/uL (ref 4.0–10.5)
nRBC: 0 % (ref 0.0–0.2)

## 2019-06-26 LAB — SAMPLE TO BLOOD BANK

## 2019-06-26 NOTE — Progress Notes (Signed)
[facility] OFFICE PROGRESS NOTE   Diagnosis: Non-Hodgkin's, ITP  INTERVAL HISTORY:   [name] returns for a scheduled visit.  She is accompanied by her husband.  They report she had a recent fall and suffered a right humeral neck fracture.  She is followed by orthopedics.  There was evidence of healing on a plain x-ray 06/21/2019.  She is dependent on her husband for most of her care.  She is ambulatory in the home. Objective:  Vital signs in last 24 hours:  Blood pressure (!) 156/80, pulse 93, temperature 98.3 F (36.8 C), temperature source Temporal, resp. rate 17, height 5' 3"  (1.6 m), weight 111 lb 8 oz (50.6 kg), SpO2 93 %.    Limited examination secondary to distancing with the Covid pandemic Neuro: Alert, not oriented today or month.  Oriented to year.  Minimal verbal interaction   Lab Results:  Lab Results  Component Value Date   WBC 4.0 06/26/2019   HGB 12.0 06/26/2019   HCT 37.2 06/26/2019   MCV 86.5 06/26/2019   PLT 158 06/26/2019   NEUTROABS 2.8 06/26/2019    CMP  Lab Results  Component Value Date   NA 140 08/30/2018   K 4.1 08/30/2018   CL 105 08/30/2018   CO2 27 08/30/2018   GLUCOSE 98 08/30/2018   BUN 10 08/30/2018   CREATININE 0.95 08/30/2018   CALCIUM 9.3 08/30/2018   PROT 6.6 08/30/2018   ALBUMIN 3.0 (L) 08/30/2018   AST 15 08/30/2018   ALT <6 08/30/2018   ALKPHOS 122 08/30/2018   BILITOT 0.5 08/30/2018   GFRNONAA 53 (L) 08/30/2018   GFRAA >60 08/30/2018    Medications: I have reviewed the patient's current medications.   Assessment/Plan:  1. Non-Hodgkin lymphoma treated with fludarabine/rituximab, last in July of 2009.  1. Restaging CT 11/24/2010 revealed evidence of progressive lymphoma in the abdomen and pelvis.  2. Initiation of salvage therapy with bendamustine/rituximab 12/04/2010. She completed 3 cycles.  3. Restaging CT 03/03/2011 revealed stable retroperitoneal soft tissue and a marked decrease in the  soft tissue thickening associated with dilated loops of small bowel. 2. History of ITP.  3. History of Herpes zoster-maintained on prophylactic acyclovir. 4. History of anemia secondary to non-Hodgkin lymphoma, chemotherapy, and a history of autoimmune hemolysis.  5. Hypertension. 6. Right-hand tremor/ataxia-followed by neurology, now taking Sinemet, diagnosed with Parkinson's disease 7. Hearing loss-status post placement of an implanted hearing device by Dr. Cresenciano Lick. 8. Bilateral breast cancer, right breast cancer-grade 1, T1 N0, ER/PR positive, and HER2 negative. Left breast cancer, synchronous grade 2, T2 N0, ER/PR positive, and HER2 negative. She began adjuvant Arimidex on 09/15/2010,completed February 2017 9. Inflammatory changes of both lungs on a CT of the chest 11/24/2010-progressive on the restaging CT 03/03/2011, status post a bronchoscopy 03/17/2011 with no evidence of granulomata or tumor. 10. 11 mm spiculated lesion in the lingula on a CT 11/24/2010-less distinct on the CT 03/03/2011. 11. Upper respiratory infection while visiting her son in [place], June 2013, resolved after treatment with Levaquin/steroids 12. Upper respiratory infection,? Pneumonia summer 2014-chest x-ray 02/17/2013 with bilateral infiltrates 13. CT chest abdomen and pelvis on 11/29/2014 with no evidence of active lymphoma or metastatic breast cancer. Previously demonstrated retroperitoneal process appeared improved with probable residual fibrosis. No discrete adenopathy. Interval near complete resolution of patchy airspace opacities in both lungs. 14. Mild swelling and pain at the right lower leg 08/06/2016-negative for acute deep vein thrombosis, possible chronic thrombus in the right popliteal vein  15. Admission July 2018 with GI bleeding, no source for blood loss found on an upper endoscopy 16. Parkinson's disease 17. Dementia 18. Fall with right humerus fracture 05/04/2019   Disposition: Ms. Barkalow  is stable from a hematologic standpoint.  There is no clinical evidence for progression of non-Hodgkin's lymphoma or breast cancer.  She appears to have progressive dementia.  I discussed the situation with her husband.  He would like her to continue follow-up at the Cancer center.  She will return for an office visit in 5 months.  Betsy Coder, MD  06/26/2019  11:37 AM

## 2019-06-27 ENCOUNTER — Telehealth: Payer: Self-pay | Admitting: Oncology

## 2019-06-27 ENCOUNTER — Telehealth: Payer: Self-pay

## 2019-06-27 DIAGNOSIS — S42201D Unspecified fracture of upper end of right humerus, subsequent encounter for fracture with routine healing: Secondary | ICD-10-CM | POA: Diagnosis not present

## 2019-06-27 DIAGNOSIS — G20 Parkinson's disease: Secondary | ICD-10-CM | POA: Diagnosis not present

## 2019-06-27 DIAGNOSIS — Z9181 History of falling: Secondary | ICD-10-CM | POA: Diagnosis not present

## 2019-06-27 NOTE — Telephone Encounter (Signed)
Telephone call to patients home to schedule palliative care visit.  RN spoke with [name].  [name] reports he does not feel he needs palliative care services for patient at the present time.  Husband requests RN inform patients MD that he does not want Palliative  Services for patient at the present time but may want services in the future.  RN called and left message with [name] at MD's office.

## 2019-06-27 NOTE — Telephone Encounter (Signed)
Scheduled per los. Called and left msg. Mailed printout  °

## 2019-07-05 DIAGNOSIS — S42201D Unspecified fracture of upper end of right humerus, subsequent encounter for fracture with routine healing: Secondary | ICD-10-CM | POA: Diagnosis not present

## 2019-07-05 DIAGNOSIS — Z9181 History of falling: Secondary | ICD-10-CM | POA: Diagnosis not present

## 2019-07-05 DIAGNOSIS — G20 Parkinson's disease: Secondary | ICD-10-CM | POA: Diagnosis not present

## 2019-07-07 DIAGNOSIS — S42201D Unspecified fracture of upper end of right humerus, subsequent encounter for fracture with routine healing: Secondary | ICD-10-CM | POA: Diagnosis not present

## 2019-07-07 DIAGNOSIS — G20 Parkinson's disease: Secondary | ICD-10-CM | POA: Diagnosis not present

## 2019-07-07 DIAGNOSIS — Z9181 History of falling: Secondary | ICD-10-CM | POA: Diagnosis not present

## 2019-07-10 DIAGNOSIS — G20 Parkinson's disease: Secondary | ICD-10-CM | POA: Diagnosis not present

## 2019-07-10 DIAGNOSIS — S42201D Unspecified fracture of upper end of right humerus, subsequent encounter for fracture with routine healing: Secondary | ICD-10-CM | POA: Diagnosis not present

## 2019-07-10 DIAGNOSIS — Z9181 History of falling: Secondary | ICD-10-CM | POA: Diagnosis not present

## 2019-07-13 DIAGNOSIS — S42201D Unspecified fracture of upper end of right humerus, subsequent encounter for fracture with routine healing: Secondary | ICD-10-CM | POA: Diagnosis not present

## 2019-07-13 DIAGNOSIS — Z9181 History of falling: Secondary | ICD-10-CM | POA: Diagnosis not present

## 2019-07-13 DIAGNOSIS — G20 Parkinson's disease: Secondary | ICD-10-CM | POA: Diagnosis not present

## 2019-07-14 DIAGNOSIS — Z9181 History of falling: Secondary | ICD-10-CM | POA: Diagnosis not present

## 2019-07-14 DIAGNOSIS — G20 Parkinson's disease: Secondary | ICD-10-CM | POA: Diagnosis not present

## 2019-07-14 DIAGNOSIS — S42201D Unspecified fracture of upper end of right humerus, subsequent encounter for fracture with routine healing: Secondary | ICD-10-CM | POA: Diagnosis not present

## 2019-07-17 DIAGNOSIS — S42201D Unspecified fracture of upper end of right humerus, subsequent encounter for fracture with routine healing: Secondary | ICD-10-CM | POA: Diagnosis not present

## 2019-07-17 DIAGNOSIS — Z9181 History of falling: Secondary | ICD-10-CM | POA: Diagnosis not present

## 2019-07-17 DIAGNOSIS — G20 Parkinson's disease: Secondary | ICD-10-CM | POA: Diagnosis not present

## 2019-07-19 DIAGNOSIS — S42201D Unspecified fracture of upper end of right humerus, subsequent encounter for fracture with routine healing: Secondary | ICD-10-CM | POA: Diagnosis not present

## 2019-07-19 DIAGNOSIS — G20 Parkinson's disease: Secondary | ICD-10-CM | POA: Diagnosis not present

## 2019-07-19 DIAGNOSIS — Z9181 History of falling: Secondary | ICD-10-CM | POA: Diagnosis not present

## 2019-07-20 DIAGNOSIS — G20 Parkinson's disease: Secondary | ICD-10-CM | POA: Diagnosis not present

## 2019-07-20 DIAGNOSIS — S42201D Unspecified fracture of upper end of right humerus, subsequent encounter for fracture with routine healing: Secondary | ICD-10-CM | POA: Diagnosis not present

## 2019-07-20 DIAGNOSIS — Z9181 History of falling: Secondary | ICD-10-CM | POA: Diagnosis not present

## 2019-07-24 DIAGNOSIS — S42201D Unspecified fracture of upper end of right humerus, subsequent encounter for fracture with routine healing: Secondary | ICD-10-CM | POA: Diagnosis not present

## 2019-07-24 DIAGNOSIS — Z9181 History of falling: Secondary | ICD-10-CM | POA: Diagnosis not present

## 2019-07-24 DIAGNOSIS — G20 Parkinson's disease: Secondary | ICD-10-CM | POA: Diagnosis not present

## 2019-08-01 DIAGNOSIS — Z9181 History of falling: Secondary | ICD-10-CM | POA: Diagnosis not present

## 2019-08-01 DIAGNOSIS — S42201D Unspecified fracture of upper end of right humerus, subsequent encounter for fracture with routine healing: Secondary | ICD-10-CM | POA: Diagnosis not present

## 2019-08-01 DIAGNOSIS — Z20828 Contact with and (suspected) exposure to other viral communicable diseases: Secondary | ICD-10-CM | POA: Diagnosis not present

## 2019-08-01 DIAGNOSIS — G20 Parkinson's disease: Secondary | ICD-10-CM | POA: Diagnosis not present

## 2019-08-02 ENCOUNTER — Ambulatory Visit: Payer: Medicare Other | Admitting: Orthopaedic Surgery

## 2019-08-04 DIAGNOSIS — S42201D Unspecified fracture of upper end of right humerus, subsequent encounter for fracture with routine healing: Secondary | ICD-10-CM | POA: Diagnosis not present

## 2019-08-04 DIAGNOSIS — G20 Parkinson's disease: Secondary | ICD-10-CM | POA: Diagnosis not present

## 2019-08-04 DIAGNOSIS — Z9181 History of falling: Secondary | ICD-10-CM | POA: Diagnosis not present

## 2019-08-07 DIAGNOSIS — Z9181 History of falling: Secondary | ICD-10-CM | POA: Diagnosis not present

## 2019-08-07 DIAGNOSIS — G20 Parkinson's disease: Secondary | ICD-10-CM | POA: Diagnosis not present

## 2019-08-07 DIAGNOSIS — S42201D Unspecified fracture of upper end of right humerus, subsequent encounter for fracture with routine healing: Secondary | ICD-10-CM | POA: Diagnosis not present

## 2019-08-08 ENCOUNTER — Ambulatory Visit: Payer: Medicare Other | Admitting: Orthopaedic Surgery

## 2019-08-09 DIAGNOSIS — S42201D Unspecified fracture of upper end of right humerus, subsequent encounter for fracture with routine healing: Secondary | ICD-10-CM | POA: Diagnosis not present

## 2019-08-09 DIAGNOSIS — Z9181 History of falling: Secondary | ICD-10-CM | POA: Diagnosis not present

## 2019-08-09 DIAGNOSIS — G20 Parkinson's disease: Secondary | ICD-10-CM | POA: Diagnosis not present

## 2019-08-11 DIAGNOSIS — G20 Parkinson's disease: Secondary | ICD-10-CM | POA: Diagnosis not present

## 2019-08-11 DIAGNOSIS — S42201D Unspecified fracture of upper end of right humerus, subsequent encounter for fracture with routine healing: Secondary | ICD-10-CM | POA: Diagnosis not present

## 2019-08-11 DIAGNOSIS — Z9181 History of falling: Secondary | ICD-10-CM | POA: Diagnosis not present

## 2019-08-13 DIAGNOSIS — G20 Parkinson's disease: Secondary | ICD-10-CM | POA: Diagnosis not present

## 2019-08-13 DIAGNOSIS — Z9181 History of falling: Secondary | ICD-10-CM | POA: Diagnosis not present

## 2019-08-13 DIAGNOSIS — S42201D Unspecified fracture of upper end of right humerus, subsequent encounter for fracture with routine healing: Secondary | ICD-10-CM | POA: Diagnosis not present

## 2019-08-14 DIAGNOSIS — S42201D Unspecified fracture of upper end of right humerus, subsequent encounter for fracture with routine healing: Secondary | ICD-10-CM | POA: Diagnosis not present

## 2019-08-14 DIAGNOSIS — Z9181 History of falling: Secondary | ICD-10-CM | POA: Diagnosis not present

## 2019-08-14 DIAGNOSIS — G20 Parkinson's disease: Secondary | ICD-10-CM | POA: Diagnosis not present

## 2019-08-16 DIAGNOSIS — G20 Parkinson's disease: Secondary | ICD-10-CM | POA: Diagnosis not present

## 2019-08-16 DIAGNOSIS — S42201D Unspecified fracture of upper end of right humerus, subsequent encounter for fracture with routine healing: Secondary | ICD-10-CM | POA: Diagnosis not present

## 2019-08-16 DIAGNOSIS — Z9181 History of falling: Secondary | ICD-10-CM | POA: Diagnosis not present

## 2019-08-18 DIAGNOSIS — G20 Parkinson's disease: Secondary | ICD-10-CM | POA: Diagnosis not present

## 2019-08-18 DIAGNOSIS — S42201D Unspecified fracture of upper end of right humerus, subsequent encounter for fracture with routine healing: Secondary | ICD-10-CM | POA: Diagnosis not present

## 2019-08-18 DIAGNOSIS — Z9181 History of falling: Secondary | ICD-10-CM | POA: Diagnosis not present

## 2019-08-21 DIAGNOSIS — G20 Parkinson's disease: Secondary | ICD-10-CM | POA: Diagnosis not present

## 2019-08-21 DIAGNOSIS — Z9181 History of falling: Secondary | ICD-10-CM | POA: Diagnosis not present

## 2019-08-21 DIAGNOSIS — S42201D Unspecified fracture of upper end of right humerus, subsequent encounter for fracture with routine healing: Secondary | ICD-10-CM | POA: Diagnosis not present

## 2019-08-22 ENCOUNTER — Ambulatory Visit: Payer: Medicare Other | Admitting: Orthopaedic Surgery

## 2019-08-23 ENCOUNTER — Ambulatory Visit: Payer: Medicare Other | Admitting: Orthopaedic Surgery

## 2019-08-23 DIAGNOSIS — Z9181 History of falling: Secondary | ICD-10-CM | POA: Diagnosis not present

## 2019-08-23 DIAGNOSIS — G20 Parkinson's disease: Secondary | ICD-10-CM | POA: Diagnosis not present

## 2019-08-23 DIAGNOSIS — S42201D Unspecified fracture of upper end of right humerus, subsequent encounter for fracture with routine healing: Secondary | ICD-10-CM | POA: Diagnosis not present

## 2019-08-25 DIAGNOSIS — S42201D Unspecified fracture of upper end of right humerus, subsequent encounter for fracture with routine healing: Secondary | ICD-10-CM | POA: Diagnosis not present

## 2019-08-25 DIAGNOSIS — Z9181 History of falling: Secondary | ICD-10-CM | POA: Diagnosis not present

## 2019-08-25 DIAGNOSIS — G20 Parkinson's disease: Secondary | ICD-10-CM | POA: Diagnosis not present

## 2019-08-28 DIAGNOSIS — Z9181 History of falling: Secondary | ICD-10-CM | POA: Diagnosis not present

## 2019-08-28 DIAGNOSIS — S42201D Unspecified fracture of upper end of right humerus, subsequent encounter for fracture with routine healing: Secondary | ICD-10-CM | POA: Diagnosis not present

## 2019-08-28 DIAGNOSIS — G20 Parkinson's disease: Secondary | ICD-10-CM | POA: Diagnosis not present

## 2019-08-30 DIAGNOSIS — G20 Parkinson's disease: Secondary | ICD-10-CM | POA: Diagnosis not present

## 2019-08-30 DIAGNOSIS — S42201D Unspecified fracture of upper end of right humerus, subsequent encounter for fracture with routine healing: Secondary | ICD-10-CM | POA: Diagnosis not present

## 2019-08-30 DIAGNOSIS — Z9181 History of falling: Secondary | ICD-10-CM | POA: Diagnosis not present

## 2019-08-31 DIAGNOSIS — S42201D Unspecified fracture of upper end of right humerus, subsequent encounter for fracture with routine healing: Secondary | ICD-10-CM | POA: Diagnosis not present

## 2019-08-31 DIAGNOSIS — G20 Parkinson's disease: Secondary | ICD-10-CM | POA: Diagnosis not present

## 2019-08-31 DIAGNOSIS — Z9181 History of falling: Secondary | ICD-10-CM | POA: Diagnosis not present

## 2019-09-04 DIAGNOSIS — S42201D Unspecified fracture of upper end of right humerus, subsequent encounter for fracture with routine healing: Secondary | ICD-10-CM | POA: Diagnosis not present

## 2019-09-04 DIAGNOSIS — G20 Parkinson's disease: Secondary | ICD-10-CM | POA: Diagnosis not present

## 2019-09-04 DIAGNOSIS — Z9181 History of falling: Secondary | ICD-10-CM | POA: Diagnosis not present

## 2019-09-06 DIAGNOSIS — S42201D Unspecified fracture of upper end of right humerus, subsequent encounter for fracture with routine healing: Secondary | ICD-10-CM | POA: Diagnosis not present

## 2019-09-06 DIAGNOSIS — Z9181 History of falling: Secondary | ICD-10-CM | POA: Diagnosis not present

## 2019-09-06 DIAGNOSIS — G20 Parkinson's disease: Secondary | ICD-10-CM | POA: Diagnosis not present

## 2019-09-11 DIAGNOSIS — Z9181 History of falling: Secondary | ICD-10-CM | POA: Diagnosis not present

## 2019-09-11 DIAGNOSIS — G20 Parkinson's disease: Secondary | ICD-10-CM | POA: Diagnosis not present

## 2019-09-11 DIAGNOSIS — S42201D Unspecified fracture of upper end of right humerus, subsequent encounter for fracture with routine healing: Secondary | ICD-10-CM | POA: Diagnosis not present

## 2019-09-12 DIAGNOSIS — G20 Parkinson's disease: Secondary | ICD-10-CM | POA: Diagnosis not present

## 2019-09-12 DIAGNOSIS — S42201D Unspecified fracture of upper end of right humerus, subsequent encounter for fracture with routine healing: Secondary | ICD-10-CM | POA: Diagnosis not present

## 2019-09-12 DIAGNOSIS — Z9181 History of falling: Secondary | ICD-10-CM | POA: Diagnosis not present

## 2019-09-13 DIAGNOSIS — G20 Parkinson's disease: Secondary | ICD-10-CM | POA: Diagnosis not present

## 2019-09-13 DIAGNOSIS — S42201D Unspecified fracture of upper end of right humerus, subsequent encounter for fracture with routine healing: Secondary | ICD-10-CM | POA: Diagnosis not present

## 2019-09-13 DIAGNOSIS — Z9181 History of falling: Secondary | ICD-10-CM | POA: Diagnosis not present

## 2019-09-18 DIAGNOSIS — S42201D Unspecified fracture of upper end of right humerus, subsequent encounter for fracture with routine healing: Secondary | ICD-10-CM | POA: Diagnosis not present

## 2019-09-18 DIAGNOSIS — Z9181 History of falling: Secondary | ICD-10-CM | POA: Diagnosis not present

## 2019-09-18 DIAGNOSIS — G20 Parkinson's disease: Secondary | ICD-10-CM | POA: Diagnosis not present

## 2019-09-19 ENCOUNTER — Telehealth: Payer: Self-pay | Admitting: Orthopaedic Surgery

## 2019-09-19 NOTE — Telephone Encounter (Signed)
ok 

## 2019-09-19 NOTE — Telephone Encounter (Signed)
See message.

## 2019-09-19 NOTE — Telephone Encounter (Signed)
Darleene from [facility] called.   She was notifying us that the patient's home health aid session on Friday was missed. The clinician resigned and they didn't assign a new one in time.   Call back number: [phone]

## 2019-09-20 DIAGNOSIS — G20 Parkinson's disease: Secondary | ICD-10-CM | POA: Diagnosis not present

## 2019-09-20 DIAGNOSIS — Z9181 History of falling: Secondary | ICD-10-CM | POA: Diagnosis not present

## 2019-09-20 DIAGNOSIS — S42201D Unspecified fracture of upper end of right humerus, subsequent encounter for fracture with routine healing: Secondary | ICD-10-CM | POA: Diagnosis not present

## 2019-09-22 DIAGNOSIS — S42201D Unspecified fracture of upper end of right humerus, subsequent encounter for fracture with routine healing: Secondary | ICD-10-CM | POA: Diagnosis not present

## 2019-09-22 DIAGNOSIS — Z9181 History of falling: Secondary | ICD-10-CM | POA: Diagnosis not present

## 2019-09-22 DIAGNOSIS — G20 Parkinson's disease: Secondary | ICD-10-CM | POA: Diagnosis not present

## 2019-09-25 DIAGNOSIS — S42201D Unspecified fracture of upper end of right humerus, subsequent encounter for fracture with routine healing: Secondary | ICD-10-CM | POA: Diagnosis not present

## 2019-09-25 DIAGNOSIS — G20 Parkinson's disease: Secondary | ICD-10-CM | POA: Diagnosis not present

## 2019-09-25 DIAGNOSIS — Z9181 History of falling: Secondary | ICD-10-CM | POA: Diagnosis not present

## 2019-09-27 DIAGNOSIS — Z9181 History of falling: Secondary | ICD-10-CM | POA: Diagnosis not present

## 2019-09-27 DIAGNOSIS — G20 Parkinson's disease: Secondary | ICD-10-CM | POA: Diagnosis not present

## 2019-09-27 DIAGNOSIS — S42201D Unspecified fracture of upper end of right humerus, subsequent encounter for fracture with routine healing: Secondary | ICD-10-CM | POA: Diagnosis not present

## 2019-09-28 DIAGNOSIS — G20 Parkinson's disease: Secondary | ICD-10-CM | POA: Diagnosis not present

## 2019-09-28 DIAGNOSIS — S42201D Unspecified fracture of upper end of right humerus, subsequent encounter for fracture with routine healing: Secondary | ICD-10-CM | POA: Diagnosis not present

## 2019-09-28 DIAGNOSIS — Z9181 History of falling: Secondary | ICD-10-CM | POA: Diagnosis not present

## 2019-10-02 DIAGNOSIS — G20 Parkinson's disease: Secondary | ICD-10-CM | POA: Diagnosis not present

## 2019-10-02 DIAGNOSIS — Z9181 History of falling: Secondary | ICD-10-CM | POA: Diagnosis not present

## 2019-10-02 DIAGNOSIS — S42201D Unspecified fracture of upper end of right humerus, subsequent encounter for fracture with routine healing: Secondary | ICD-10-CM | POA: Diagnosis not present

## 2019-10-04 DIAGNOSIS — G20 Parkinson's disease: Secondary | ICD-10-CM | POA: Diagnosis not present

## 2019-10-04 DIAGNOSIS — Z9181 History of falling: Secondary | ICD-10-CM | POA: Diagnosis not present

## 2019-10-04 DIAGNOSIS — S42201D Unspecified fracture of upper end of right humerus, subsequent encounter for fracture with routine healing: Secondary | ICD-10-CM | POA: Diagnosis not present

## 2019-10-05 DIAGNOSIS — G20 Parkinson's disease: Secondary | ICD-10-CM | POA: Diagnosis not present

## 2019-10-05 DIAGNOSIS — N3281 Overactive bladder: Secondary | ICD-10-CM | POA: Diagnosis not present

## 2019-10-05 DIAGNOSIS — E7800 Pure hypercholesterolemia, unspecified: Secondary | ICD-10-CM | POA: Diagnosis not present

## 2019-10-05 DIAGNOSIS — Z8572 Personal history of non-Hodgkin lymphomas: Secondary | ICD-10-CM | POA: Diagnosis not present

## 2019-10-05 DIAGNOSIS — Z853 Personal history of malignant neoplasm of breast: Secondary | ICD-10-CM | POA: Diagnosis not present

## 2019-10-05 DIAGNOSIS — R413 Other amnesia: Secondary | ICD-10-CM | POA: Diagnosis not present

## 2019-10-05 DIAGNOSIS — I10 Essential (primary) hypertension: Secondary | ICD-10-CM | POA: Diagnosis not present

## 2019-10-05 DIAGNOSIS — F419 Anxiety disorder, unspecified: Secondary | ICD-10-CM | POA: Diagnosis not present

## 2019-10-09 DIAGNOSIS — G20 Parkinson's disease: Secondary | ICD-10-CM | POA: Diagnosis not present

## 2019-10-09 DIAGNOSIS — S42201D Unspecified fracture of upper end of right humerus, subsequent encounter for fracture with routine healing: Secondary | ICD-10-CM | POA: Diagnosis not present

## 2019-10-09 DIAGNOSIS — Z9181 History of falling: Secondary | ICD-10-CM | POA: Diagnosis not present

## 2019-10-12 DIAGNOSIS — Z9181 History of falling: Secondary | ICD-10-CM | POA: Diagnosis not present

## 2019-10-12 DIAGNOSIS — S42201D Unspecified fracture of upper end of right humerus, subsequent encounter for fracture with routine healing: Secondary | ICD-10-CM | POA: Diagnosis not present

## 2019-10-12 DIAGNOSIS — G20 Parkinson's disease: Secondary | ICD-10-CM | POA: Diagnosis not present

## 2019-10-12 NOTE — Progress Notes (Signed)
Assessment/Plan:   1.  Parkinsons Disease  -Continue carbidopa/levodopa 25/100, 1/1/2 2.  GAD  -Continue sertraline, 50 mg daily 3.  Advanced PDD  -Has 24 hour/day care.    -Long discussion with the patient's husband about respite care.  Discussed respite care through [facility] [place].  Discussed [facility].  -Had social worker meet with patient and husband.  -Off donepezil due to nausea and vomiting 4.  Urinary incontinence  -Follows with urology 5.  F/u 9 months   Subjective:   [name] was seen today in follow up for Parkinsons disease.  My previous records were reviewed prior to todays visit as well as outside records available to me. Pt fell back in October sustained a humerus fracture.  She has been following with orthopedics.  Patient also follows with oncology for history of non-Hodgkin's lymphoma.  She was last seen in November, 2020.  Those records were reviewed.  She is still in PT.  She had one other minor fall per husband.   She has a pedal bike at home.  Current prescribed movement disorder medications: Carbidopa/levodopa 25/100, 1/1/2 Sertraline    ALLERGIES:   Allergies  Allergen Reactions  . Azithromycin Other (See Comments)    confusion  . Tramadol Other (See Comments)    Hallucinations    CURRENT MEDICATIONS:  Outpatient Encounter Medications as of 10/13/2019  Medication Sig  . acetaminophen (TYLENOL) 325 MG tablet Take 650 mg by mouth every 4 (four) hours as needed for mild pain, moderate pain, fever or headache.   [name] acyclovir (ZOVIRAX) 400 MG tablet Take 1 tablet (400 mg total) by mouth 2 (two) times daily.  . Calcium Carbonate (CALCIUM-CARB 600 PO) Take 600 mg by mouth daily.  . carbidopa-levodopa (SINEMET IR) 25-100 MG tablet TAKE 1 TABLET BY MOUTH EVERY MORNING, 1 TABLET BY MOUTH IN THE AFTERNOON, AND 2 TABLETS BY MOUTH IN THE EVENING  . cholecalciferol (VITAMIN D) 1000 units tablet Take 1,000 Units by mouth  every evening.   [name] HYDROcodone-acetaminophen (NORCO/VICODIN) 5-325 MG tablet Take 1 tablet by mouth every 6 (six) hours as needed for moderate pain.  . Multiple Vitamins-Minerals (MULTIVITAMIN WITH MINERALS) tablet Take 1 tablet by mouth daily with breakfast.   . sertraline (ZOLOFT) 100 MG tablet Take 1 tablet (100 mg total) by mouth daily.  . [DISCONTINUED] HYDROcodone-acetaminophen (NORCO/VICODIN) 5-325 MG tablet Take 1 tablet by mouth every 4 (four) hours as needed.  . [DISCONTINUED] donepezil (ARICEPT) 10 MG tablet TAKE 1 TABLET BY MOUTH DAILY (Patient not taking: Reported on 10/13/2019)  . [DISCONTINUED] entacapone (COMTAN) 200 MG tablet TAKE 1 TABLET BY MOUTH 3 TIMES DAILY WITH SINEMET (Patient not taking: Reported on 10/13/2019)  . [DISCONTINUED] Fluticasone-Salmeterol (ADVAIR) 250-50 MCG/DOSE AEPB Inhale 1 puff into the lungs 2 (two) times daily.   No facility-administered encounter medications on file as of 10/13/2019.    Objective:   PHYSICAL EXAMINATION:    VITALS:   Vitals:   10/13/19 1122  BP: (!) 163/82  Pulse: 68  SpO2: 97%  Weight: 114 lb (51.7 kg)  Height: 5' (1.524 m)    GEN:  The patient appears stated age and is in NAD. HEENT:  Normocephalic, atraumatic.  The mucous membranes are moist. The superficial temporal arteries are without ropiness or tenderness. CV:  RRR Lungs:  CTAB Neck/HEME:  There are no carotid bruits bilaterally.  Neurological examination:  Orientation: The patient is alert and oriented to person.  She is able to recognize examiner  as "Dr. [name]" Cranial nerves: There is good facial symmetry with facial hypomimia. The speech is fluent and clear. Soft palate rises symmetrically and there is no tongue deviation. Hearing is intact to conversational tone. Sensation: Sensation is intact to light touch throughout Motor: Strength is at least antigravity x4.  Movement examination: Tone: There is normal tone in the UE/LE Abnormal movements:  none Coordination:  There is no decremation with RAM's, but she is very apraxic Gait and Station: The patient requires assistance out of the wheelchair.  She is apraxic when given the walker.  She is very slow and runs the walker into the doorways and into the hall and the walker has to be manually redirected.     Total time spent on today's visit was 40 minutes, including both face-to-face time and nonface-to-face time.  Time included that spent on review of records (prior notes available to me/labs/imaging if pertinent), discussing treatment and goals, answering patient's questions and coordinating care.  Cc:  [name], MD

## 2019-10-13 ENCOUNTER — Other Ambulatory Visit: Payer: Self-pay

## 2019-10-13 ENCOUNTER — Ambulatory Visit (INDEPENDENT_AMBULATORY_CARE_PROVIDER_SITE_OTHER): Payer: Medicare Other | Admitting: Clinical

## 2019-10-13 ENCOUNTER — Encounter: Payer: Self-pay | Admitting: Neurology

## 2019-10-13 ENCOUNTER — Ambulatory Visit (INDEPENDENT_AMBULATORY_CARE_PROVIDER_SITE_OTHER): Payer: Medicare Other | Admitting: Neurology

## 2019-10-13 VITALS — BP 163/82 | HR 68 | Ht 60.0 in | Wt 114.0 lb

## 2019-10-13 DIAGNOSIS — F0280 Dementia in other diseases classified elsewhere without behavioral disturbance: Secondary | ICD-10-CM

## 2019-10-13 DIAGNOSIS — G20 Parkinson's disease: Secondary | ICD-10-CM | POA: Diagnosis not present

## 2019-10-13 NOTE — Patient Instructions (Signed)
No changes in your medication today.    Exercise at your level!  The physicians and staff at [facility] are committed to providing excellent care. You may receive a survey requesting feedback about your experience at our office. We strive to receive "very good" responses to the survey questions. If you feel that your experience would prevent you from giving the office a "very good " response, please contact our office to try to remedy the situation. We may be reached at [phone]. Thank you for taking the time out of your busy day to complete the survey.

## 2019-10-13 NOTE — BH Specialist Note (Signed)
LCSW provided the following information to patient's husband- 2 resources for respite funding, contact info for Day program, dementia patient navigator program and 24/7 helpline. I provided my contact information if pt's husband or DIL who helps coordinate care have any questions.

## 2019-10-16 DIAGNOSIS — S42201D Unspecified fracture of upper end of right humerus, subsequent encounter for fracture with routine healing: Secondary | ICD-10-CM | POA: Diagnosis not present

## 2019-10-16 DIAGNOSIS — Z9181 History of falling: Secondary | ICD-10-CM | POA: Diagnosis not present

## 2019-10-16 DIAGNOSIS — G20 Parkinson's disease: Secondary | ICD-10-CM | POA: Diagnosis not present

## 2019-10-18 DIAGNOSIS — S42201D Unspecified fracture of upper end of right humerus, subsequent encounter for fracture with routine healing: Secondary | ICD-10-CM | POA: Diagnosis not present

## 2019-10-18 DIAGNOSIS — Z9181 History of falling: Secondary | ICD-10-CM | POA: Diagnosis not present

## 2019-10-18 DIAGNOSIS — G20 Parkinson's disease: Secondary | ICD-10-CM | POA: Diagnosis not present

## 2019-10-20 DIAGNOSIS — S42201D Unspecified fracture of upper end of right humerus, subsequent encounter for fracture with routine healing: Secondary | ICD-10-CM | POA: Diagnosis not present

## 2019-10-20 DIAGNOSIS — G20 Parkinson's disease: Secondary | ICD-10-CM | POA: Diagnosis not present

## 2019-10-20 DIAGNOSIS — Z9181 History of falling: Secondary | ICD-10-CM | POA: Diagnosis not present

## 2019-10-23 DIAGNOSIS — S42201D Unspecified fracture of upper end of right humerus, subsequent encounter for fracture with routine healing: Secondary | ICD-10-CM | POA: Diagnosis not present

## 2019-10-23 DIAGNOSIS — G20 Parkinson's disease: Secondary | ICD-10-CM | POA: Diagnosis not present

## 2019-10-23 DIAGNOSIS — Z9181 History of falling: Secondary | ICD-10-CM | POA: Diagnosis not present

## 2019-10-25 DIAGNOSIS — S42201D Unspecified fracture of upper end of right humerus, subsequent encounter for fracture with routine healing: Secondary | ICD-10-CM | POA: Diagnosis not present

## 2019-10-25 DIAGNOSIS — G20 Parkinson's disease: Secondary | ICD-10-CM | POA: Diagnosis not present

## 2019-10-25 DIAGNOSIS — Z9181 History of falling: Secondary | ICD-10-CM | POA: Diagnosis not present

## 2019-10-26 DIAGNOSIS — G20 Parkinson's disease: Secondary | ICD-10-CM | POA: Diagnosis not present

## 2019-10-26 DIAGNOSIS — S42201D Unspecified fracture of upper end of right humerus, subsequent encounter for fracture with routine healing: Secondary | ICD-10-CM | POA: Diagnosis not present

## 2019-10-26 DIAGNOSIS — Z9181 History of falling: Secondary | ICD-10-CM | POA: Diagnosis not present

## 2019-10-30 DIAGNOSIS — G20 Parkinson's disease: Secondary | ICD-10-CM | POA: Diagnosis not present

## 2019-10-30 DIAGNOSIS — S42201D Unspecified fracture of upper end of right humerus, subsequent encounter for fracture with routine healing: Secondary | ICD-10-CM | POA: Diagnosis not present

## 2019-10-30 DIAGNOSIS — Z9181 History of falling: Secondary | ICD-10-CM | POA: Diagnosis not present

## 2019-11-01 DIAGNOSIS — S42201D Unspecified fracture of upper end of right humerus, subsequent encounter for fracture with routine healing: Secondary | ICD-10-CM | POA: Diagnosis not present

## 2019-11-01 DIAGNOSIS — G20 Parkinson's disease: Secondary | ICD-10-CM | POA: Diagnosis not present

## 2019-11-01 DIAGNOSIS — Z9181 History of falling: Secondary | ICD-10-CM | POA: Diagnosis not present

## 2019-11-02 DIAGNOSIS — L603 Nail dystrophy: Secondary | ICD-10-CM | POA: Diagnosis not present

## 2019-11-02 DIAGNOSIS — M79672 Pain in left foot: Secondary | ICD-10-CM | POA: Diagnosis not present

## 2019-11-02 DIAGNOSIS — L600 Ingrowing nail: Secondary | ICD-10-CM | POA: Diagnosis not present

## 2019-11-02 DIAGNOSIS — I739 Peripheral vascular disease, unspecified: Secondary | ICD-10-CM | POA: Diagnosis not present

## 2019-11-02 DIAGNOSIS — Z9181 History of falling: Secondary | ICD-10-CM | POA: Diagnosis not present

## 2019-11-02 DIAGNOSIS — M79671 Pain in right foot: Secondary | ICD-10-CM | POA: Diagnosis not present

## 2019-11-02 DIAGNOSIS — G20 Parkinson's disease: Secondary | ICD-10-CM | POA: Diagnosis not present

## 2019-11-02 DIAGNOSIS — B351 Tinea unguium: Secondary | ICD-10-CM | POA: Diagnosis not present

## 2019-11-02 DIAGNOSIS — S42201D Unspecified fracture of upper end of right humerus, subsequent encounter for fracture with routine healing: Secondary | ICD-10-CM | POA: Diagnosis not present

## 2019-11-03 ENCOUNTER — Telehealth: Payer: Self-pay | Admitting: Clinical

## 2019-11-03 NOTE — Telephone Encounter (Signed)
DIL LVM for LCSW inquiring about "sitter services". I attempted to return her call but unable to reach her, will try again.

## 2019-11-06 DIAGNOSIS — G20 Parkinson's disease: Secondary | ICD-10-CM | POA: Diagnosis not present

## 2019-11-06 DIAGNOSIS — Z9181 History of falling: Secondary | ICD-10-CM | POA: Diagnosis not present

## 2019-11-06 DIAGNOSIS — S42201D Unspecified fracture of upper end of right humerus, subsequent encounter for fracture with routine healing: Secondary | ICD-10-CM | POA: Diagnosis not present

## 2019-11-08 DIAGNOSIS — Z9181 History of falling: Secondary | ICD-10-CM | POA: Diagnosis not present

## 2019-11-08 DIAGNOSIS — G20 Parkinson's disease: Secondary | ICD-10-CM | POA: Diagnosis not present

## 2019-11-08 DIAGNOSIS — S42201D Unspecified fracture of upper end of right humerus, subsequent encounter for fracture with routine healing: Secondary | ICD-10-CM | POA: Diagnosis not present

## 2019-11-11 DIAGNOSIS — S42201D Unspecified fracture of upper end of right humerus, subsequent encounter for fracture with routine healing: Secondary | ICD-10-CM | POA: Diagnosis not present

## 2019-11-11 DIAGNOSIS — Z9181 History of falling: Secondary | ICD-10-CM | POA: Diagnosis not present

## 2019-11-11 DIAGNOSIS — G20 Parkinson's disease: Secondary | ICD-10-CM | POA: Diagnosis not present

## 2019-11-13 DIAGNOSIS — G20 Parkinson's disease: Secondary | ICD-10-CM | POA: Diagnosis not present

## 2019-11-13 DIAGNOSIS — S42201D Unspecified fracture of upper end of right humerus, subsequent encounter for fracture with routine healing: Secondary | ICD-10-CM | POA: Diagnosis not present

## 2019-11-13 DIAGNOSIS — Z9181 History of falling: Secondary | ICD-10-CM | POA: Diagnosis not present

## 2019-11-15 DIAGNOSIS — Z9181 History of falling: Secondary | ICD-10-CM | POA: Diagnosis not present

## 2019-11-15 DIAGNOSIS — G20 Parkinson's disease: Secondary | ICD-10-CM | POA: Diagnosis not present

## 2019-11-15 DIAGNOSIS — S42201D Unspecified fracture of upper end of right humerus, subsequent encounter for fracture with routine healing: Secondary | ICD-10-CM | POA: Diagnosis not present

## 2019-11-16 DIAGNOSIS — S42201D Unspecified fracture of upper end of right humerus, subsequent encounter for fracture with routine healing: Secondary | ICD-10-CM | POA: Diagnosis not present

## 2019-11-16 DIAGNOSIS — G20 Parkinson's disease: Secondary | ICD-10-CM | POA: Diagnosis not present

## 2019-11-16 DIAGNOSIS — Z9181 History of falling: Secondary | ICD-10-CM | POA: Diagnosis not present

## 2019-11-20 DIAGNOSIS — G20 Parkinson's disease: Secondary | ICD-10-CM | POA: Diagnosis not present

## 2019-11-20 DIAGNOSIS — Z9181 History of falling: Secondary | ICD-10-CM | POA: Diagnosis not present

## 2019-11-20 DIAGNOSIS — S42201D Unspecified fracture of upper end of right humerus, subsequent encounter for fracture with routine healing: Secondary | ICD-10-CM | POA: Diagnosis not present

## 2019-11-23 DIAGNOSIS — G20 Parkinson's disease: Secondary | ICD-10-CM | POA: Diagnosis not present

## 2019-11-23 DIAGNOSIS — Z9181 History of falling: Secondary | ICD-10-CM | POA: Diagnosis not present

## 2019-11-23 DIAGNOSIS — S42201D Unspecified fracture of upper end of right humerus, subsequent encounter for fracture with routine healing: Secondary | ICD-10-CM | POA: Diagnosis not present

## 2019-11-24 ENCOUNTER — Other Ambulatory Visit: Payer: Self-pay

## 2019-11-24 ENCOUNTER — Inpatient Hospital Stay: Payer: Medicare Other | Attending: Oncology | Admitting: Oncology

## 2019-11-24 ENCOUNTER — Inpatient Hospital Stay: Payer: Medicare Other

## 2019-11-24 ENCOUNTER — Telehealth: Payer: Self-pay | Admitting: Oncology

## 2019-11-24 VITALS — BP 154/75 | HR 75 | Temp 98.9°F | Resp 17 | Ht 60.0 in | Wt 111.7 lb

## 2019-11-24 DIAGNOSIS — Z8572 Personal history of non-Hodgkin lymphomas: Secondary | ICD-10-CM | POA: Insufficient documentation

## 2019-11-24 DIAGNOSIS — Z853 Personal history of malignant neoplasm of breast: Secondary | ICD-10-CM | POA: Diagnosis not present

## 2019-11-24 DIAGNOSIS — G20 Parkinson's disease: Secondary | ICD-10-CM | POA: Insufficient documentation

## 2019-11-24 DIAGNOSIS — C8590 Non-Hodgkin lymphoma, unspecified, unspecified site: Secondary | ICD-10-CM

## 2019-11-24 DIAGNOSIS — Z9221 Personal history of antineoplastic chemotherapy: Secondary | ICD-10-CM | POA: Insufficient documentation

## 2019-11-24 DIAGNOSIS — D589 Hereditary hemolytic anemia, unspecified: Secondary | ICD-10-CM | POA: Diagnosis not present

## 2019-11-24 DIAGNOSIS — D693 Immune thrombocytopenic purpura: Secondary | ICD-10-CM | POA: Diagnosis not present

## 2019-11-24 DIAGNOSIS — Z170 Estrogen receptor positive status [ER+]: Secondary | ICD-10-CM | POA: Insufficient documentation

## 2019-11-24 DIAGNOSIS — I10 Essential (primary) hypertension: Secondary | ICD-10-CM | POA: Diagnosis not present

## 2019-11-24 DIAGNOSIS — Z79899 Other long term (current) drug therapy: Secondary | ICD-10-CM | POA: Diagnosis not present

## 2019-11-24 DIAGNOSIS — Z9181 History of falling: Secondary | ICD-10-CM | POA: Diagnosis not present

## 2019-11-24 DIAGNOSIS — S42201D Unspecified fracture of upper end of right humerus, subsequent encounter for fracture with routine healing: Secondary | ICD-10-CM | POA: Diagnosis not present

## 2019-11-24 LAB — CMP (CANCER CENTER ONLY)
ALT: 10 U/L (ref 0–44)
AST: 20 U/L (ref 15–41)
Albumin: 3.7 g/dL (ref 3.5–5.0)
Alkaline Phosphatase: 115 U/L (ref 38–126)
Anion gap: 8 (ref 5–15)
BUN: 15 mg/dL (ref 8–23)
CO2: 29 mmol/L (ref 22–32)
Calcium: 9.4 mg/dL (ref 8.9–10.3)
Chloride: 104 mmol/L (ref 98–111)
Creatinine: 0.95 mg/dL (ref 0.44–1.00)
GFR, Est AFR Am: >60 mL/min (ref >60)
GFR, Estimated: 53 mL/min — ABNORMAL LOW (ref >60)
Glucose, Bld: 86 mg/dL (ref 70–99)
Potassium: 4.2 mmol/L (ref 3.5–5.1)
Sodium: 141 mmol/L (ref 135–145)
Total Bilirubin: 0.4 mg/dL (ref 0.3–1.2)
Total Protein: 6.7 g/dL (ref 6.5–8.1)

## 2019-11-24 LAB — CBC WITH DIFFERENTIAL (CANCER CENTER ONLY)
Abs Immature Granulocytes: 0.02 10*3/uL (ref 0.00–0.07)
Basophils Absolute: 0.1 10*3/uL (ref 0.0–0.1)
Basophils Relative: 1 %
Eosinophils Absolute: 0.1 10*3/uL (ref 0.0–0.5)
Eosinophils Relative: 3 %
HCT: 37.4 % (ref 36.0–46.0)
Hemoglobin: 11.8 g/dL — ABNORMAL LOW (ref 12.0–15.0)
Immature Granulocytes: 1 %
Lymphocytes Relative: 17 %
Lymphs Abs: 0.7 10*3/uL (ref 0.7–4.0)
MCH: 27.5 pg (ref 26.0–34.0)
MCHC: 31.6 g/dL (ref 30.0–36.0)
MCV: 87.2 fL (ref 80.0–100.0)
Monocytes Absolute: 0.3 10*3/uL (ref 0.1–1.0)
Monocytes Relative: 7 %
Neutro Abs: 2.9 10*3/uL (ref 1.7–7.7)
Neutrophils Relative %: 71 %
Platelet Count: 151 10*3/uL (ref 150–400)
RBC: 4.29 MIL/uL (ref 3.87–5.11)
RDW: 13.3 % (ref 11.5–15.5)
WBC Count: 4 10*3/uL (ref 4.0–10.5)
nRBC: 0 % (ref 0.0–0.2)

## 2019-11-24 NOTE — Telephone Encounter (Signed)
Scheduled appt per 4/30 los.  Printed calendar and avs.

## 2019-11-24 NOTE — Patient Instructions (Signed)
Please provide copy of your Medical Advanced Directive to have scanned into record.

## 2019-11-24 NOTE — Progress Notes (Signed)
[place] OFFICE PROGRESS NOTE   Diagnosis: Breast cancer, anemia, ITP  INTERVAL HISTORY:   [name] returns for scheduled visit.  She is here with her husband.  She continues to have memory loss and confusion.  She has in-home assistance.  She has chronic shoulder and back pain.  She sees Dr. [name] for management of Parkinson disease.  Objective:  Vital signs in last 24 hours:  Blood pressure (!) 154/75, pulse 75, temperature 98.9 F (37.2 C), temperature source Oral, resp. rate 17, height 5' (1.524 m), weight 111 lb 11.2 oz (50.7 kg), SpO2 98 %.     Lymphatics: No cervical, supraclavicular, or axillary nodes Resp: Inspiratory rhonchi at the right posterior base, no respiratory distress Cardio: Regular rate and rhythm GI: No hepatosplenomegaly, no mass, nontender Vascular: No leg edema Breast: Status post bilateral mastectomy, no evidence for chest wall tumor recurrence.    Lab Results:  Lab Results  Component Value Date   WBC 4.0 11/24/2019   HGB 11.8 (L) 11/24/2019   HCT 37.4 11/24/2019   MCV 87.2 11/24/2019   PLT 151 11/24/2019   NEUTROABS 2.9 11/24/2019    CMP  Lab Results  Component Value Date   NA 141 11/24/2019   K 4.2 11/24/2019   CL 104 11/24/2019   CO2 29 11/24/2019   GLUCOSE 86 11/24/2019   BUN 15 11/24/2019   CREATININE 0.95 11/24/2019   CALCIUM 9.4 11/24/2019   PROT 6.7 11/24/2019   ALBUMIN 3.7 11/24/2019   AST 20 11/24/2019   ALT 10 11/24/2019   ALKPHOS 115 11/24/2019   BILITOT 0.4 11/24/2019   GFRNONAA 53 (L) 11/24/2019   GFRAA >60 11/24/2019     Medications: I have reviewed the patient's current medications.   Assessment/Plan:   1. Non-Hodgkin lymphoma treated with fludarabine/rituximab, last in July of 2009.  1. Restaging CT 11/24/2010 revealed evidence of progressive lymphoma in the abdomen and pelvis.  2. Initiation of salvage therapy with bendamustine/rituximab 12/04/2010. She completed 3 cycles.   3. Restaging CT 03/03/2011 revealed stable retroperitoneal soft tissue and a marked decrease in the soft tissue thickening associated with dilated loops of small bowel. 2. History of ITP.  3. History of Herpes zoster-maintained on prophylactic acyclovir. 4. History of anemia secondary to non-Hodgkin lymphoma, chemotherapy, and a history of autoimmune hemolysis.  5. Hypertension. 6. Right-hand tremor/ataxia-followed by neurology, now taking Sinemet, diagnosed with Parkinson's disease 7. Hearing loss-status post placement of an implanted hearing device by Dr. Cresenciano Lick. 8. Bilateral breast cancer, right breast cancer-grade 1, T1 N0, ER/PR positive, and HER2 negative. Left breast cancer, synchronous grade 2, T2 N0, ER/PR positive, and HER2 negative. She began adjuvant Arimidex on 09/15/2010,completed February 2017 9. Inflammatory changes of both lungs on a CT of the chest 11/24/2010-progressive on the restaging CT 03/03/2011, status post a bronchoscopy 03/17/2011 with no evidence of granulomata or tumor. 10. 11 mm spiculated lesion in the lingula on a CT 11/24/2010-less distinct on the CT 03/03/2011. 11. Upper respiratory infection while visiting her son in [place], June 2013, resolved after treatment with Levaquin/steroids 12. Upper respiratory infection,? Pneumonia summer 2014-chest x-ray 02/17/2013 with bilateral infiltrates 13. CT chest abdomen and pelvis on 11/29/2014 with no evidence of active lymphoma or metastatic breast cancer. Previously demonstrated retroperitoneal process appeared improved with probable residual fibrosis. No discrete adenopathy. Interval near complete resolution of patchy airspace opacities in both lungs. 14. Mild swelling and pain at the right lower leg 08/06/2016-negative for acute deep vein thrombosis, possible  chronic thrombus in the right popliteal vein 15. Admission July 2018 with GI bleeding, no source for blood loss found on an upper endoscopy 16. Parkinson's  disease 17. Dementia 18. Fall with right humerus fracture 05/04/2019    Disposition: [name] remains in clinical remission from breast cancer, lymphoma, hemolytic anemia, and ITP.  She appears to have significant dementia.  She and her husband have received the COVID-19 vaccine.  She would like to continue follow-up at the Cancer center.  She will return for an office visit in 6 months.  Betsy Coder, MD  11/24/2019  2:07 PM

## 2019-11-27 DIAGNOSIS — Z9181 History of falling: Secondary | ICD-10-CM | POA: Diagnosis not present

## 2019-11-27 DIAGNOSIS — S42201D Unspecified fracture of upper end of right humerus, subsequent encounter for fracture with routine healing: Secondary | ICD-10-CM | POA: Diagnosis not present

## 2019-11-27 DIAGNOSIS — G20 Parkinson's disease: Secondary | ICD-10-CM | POA: Diagnosis not present

## 2019-11-29 DIAGNOSIS — Z9181 History of falling: Secondary | ICD-10-CM | POA: Diagnosis not present

## 2019-11-29 DIAGNOSIS — S42201D Unspecified fracture of upper end of right humerus, subsequent encounter for fracture with routine healing: Secondary | ICD-10-CM | POA: Diagnosis not present

## 2019-11-29 DIAGNOSIS — G20 Parkinson's disease: Secondary | ICD-10-CM | POA: Diagnosis not present

## 2019-11-30 ENCOUNTER — Telehealth: Payer: Self-pay

## 2019-11-30 DIAGNOSIS — Z9181 History of falling: Secondary | ICD-10-CM | POA: Diagnosis not present

## 2019-11-30 DIAGNOSIS — G20 Parkinson's disease: Secondary | ICD-10-CM | POA: Diagnosis not present

## 2019-11-30 DIAGNOSIS — S42201D Unspecified fracture of upper end of right humerus, subsequent encounter for fracture with routine healing: Secondary | ICD-10-CM | POA: Diagnosis not present

## 2019-11-30 NOTE — Telephone Encounter (Signed)
I would recommend having the nurses take a look first and if they don't feel comfortable taking care of it, then she should come in to the office.  Thanks.

## 2019-11-30 NOTE — Telephone Encounter (Signed)
Darlene, Publishing copy with [facility] called stating that patient had a fall on Wednesday, 11/29/2019 and has a skin tear on her right forearm up to her right elbow.  Stated that she is not sure how bad the skin tear is, due to patient having a bandage already in place.  Patient also has bruising on her left wrist.  Would like to know if Dr. Erlinda Hong needs to see patient or if the nurses can take care of this for the patient?  Patient's next appointment for therapy is on Monday, 12/04/2019 and was not sure if Dr. Erlinda Hong wanted to wait that long.  Please call to advise.  Cb# [phone].  Thank you.

## 2019-12-01 DIAGNOSIS — S42201D Unspecified fracture of upper end of right humerus, subsequent encounter for fracture with routine healing: Secondary | ICD-10-CM | POA: Diagnosis not present

## 2019-12-01 DIAGNOSIS — G20 Parkinson's disease: Secondary | ICD-10-CM | POA: Diagnosis not present

## 2019-12-01 DIAGNOSIS — Z9181 History of falling: Secondary | ICD-10-CM | POA: Diagnosis not present

## 2019-12-01 NOTE — Telephone Encounter (Signed)
I called Darlene and advised. Nurse will go out today.

## 2019-12-04 DIAGNOSIS — S42201D Unspecified fracture of upper end of right humerus, subsequent encounter for fracture with routine healing: Secondary | ICD-10-CM | POA: Diagnosis not present

## 2019-12-04 DIAGNOSIS — G20 Parkinson's disease: Secondary | ICD-10-CM | POA: Diagnosis not present

## 2019-12-04 DIAGNOSIS — Z9181 History of falling: Secondary | ICD-10-CM | POA: Diagnosis not present

## 2019-12-06 DIAGNOSIS — Z9181 History of falling: Secondary | ICD-10-CM | POA: Diagnosis not present

## 2019-12-06 DIAGNOSIS — S42201D Unspecified fracture of upper end of right humerus, subsequent encounter for fracture with routine healing: Secondary | ICD-10-CM | POA: Diagnosis not present

## 2019-12-06 DIAGNOSIS — G20 Parkinson's disease: Secondary | ICD-10-CM | POA: Diagnosis not present

## 2019-12-08 DIAGNOSIS — G20 Parkinson's disease: Secondary | ICD-10-CM | POA: Diagnosis not present

## 2019-12-08 DIAGNOSIS — Z9181 History of falling: Secondary | ICD-10-CM | POA: Diagnosis not present

## 2019-12-08 DIAGNOSIS — S42201D Unspecified fracture of upper end of right humerus, subsequent encounter for fracture with routine healing: Secondary | ICD-10-CM | POA: Diagnosis not present

## 2019-12-11 DIAGNOSIS — Z974 Presence of external hearing-aid: Secondary | ICD-10-CM | POA: Diagnosis not present

## 2019-12-11 DIAGNOSIS — S51811D Laceration without foreign body of right forearm, subsequent encounter: Secondary | ICD-10-CM | POA: Diagnosis not present

## 2019-12-11 DIAGNOSIS — S42201D Unspecified fracture of upper end of right humerus, subsequent encounter for fracture with routine healing: Secondary | ICD-10-CM | POA: Diagnosis not present

## 2019-12-11 DIAGNOSIS — H9193 Unspecified hearing loss, bilateral: Secondary | ICD-10-CM | POA: Diagnosis not present

## 2019-12-11 DIAGNOSIS — Z9181 History of falling: Secondary | ICD-10-CM | POA: Diagnosis not present

## 2019-12-11 DIAGNOSIS — R32 Unspecified urinary incontinence: Secondary | ICD-10-CM | POA: Diagnosis not present

## 2019-12-11 DIAGNOSIS — G20 Parkinson's disease: Secondary | ICD-10-CM | POA: Diagnosis not present

## 2019-12-12 DIAGNOSIS — S51811D Laceration without foreign body of right forearm, subsequent encounter: Secondary | ICD-10-CM | POA: Diagnosis not present

## 2019-12-12 DIAGNOSIS — G20 Parkinson's disease: Secondary | ICD-10-CM | POA: Diagnosis not present

## 2019-12-12 DIAGNOSIS — S42201D Unspecified fracture of upper end of right humerus, subsequent encounter for fracture with routine healing: Secondary | ICD-10-CM | POA: Diagnosis not present

## 2019-12-12 DIAGNOSIS — Z974 Presence of external hearing-aid: Secondary | ICD-10-CM | POA: Diagnosis not present

## 2019-12-12 DIAGNOSIS — H9193 Unspecified hearing loss, bilateral: Secondary | ICD-10-CM | POA: Diagnosis not present

## 2019-12-12 DIAGNOSIS — R32 Unspecified urinary incontinence: Secondary | ICD-10-CM | POA: Diagnosis not present

## 2019-12-14 DIAGNOSIS — H9193 Unspecified hearing loss, bilateral: Secondary | ICD-10-CM | POA: Diagnosis not present

## 2019-12-14 DIAGNOSIS — R32 Unspecified urinary incontinence: Secondary | ICD-10-CM | POA: Diagnosis not present

## 2019-12-14 DIAGNOSIS — S51811D Laceration without foreign body of right forearm, subsequent encounter: Secondary | ICD-10-CM | POA: Diagnosis not present

## 2019-12-14 DIAGNOSIS — G20 Parkinson's disease: Secondary | ICD-10-CM | POA: Diagnosis not present

## 2019-12-14 DIAGNOSIS — Z974 Presence of external hearing-aid: Secondary | ICD-10-CM | POA: Diagnosis not present

## 2019-12-14 DIAGNOSIS — S42201D Unspecified fracture of upper end of right humerus, subsequent encounter for fracture with routine healing: Secondary | ICD-10-CM | POA: Diagnosis not present

## 2019-12-18 DIAGNOSIS — H9193 Unspecified hearing loss, bilateral: Secondary | ICD-10-CM | POA: Diagnosis not present

## 2019-12-18 DIAGNOSIS — G20 Parkinson's disease: Secondary | ICD-10-CM | POA: Diagnosis not present

## 2019-12-18 DIAGNOSIS — R32 Unspecified urinary incontinence: Secondary | ICD-10-CM | POA: Diagnosis not present

## 2019-12-18 DIAGNOSIS — S51811D Laceration without foreign body of right forearm, subsequent encounter: Secondary | ICD-10-CM | POA: Diagnosis not present

## 2019-12-18 DIAGNOSIS — S42201D Unspecified fracture of upper end of right humerus, subsequent encounter for fracture with routine healing: Secondary | ICD-10-CM | POA: Diagnosis not present

## 2019-12-18 DIAGNOSIS — Z974 Presence of external hearing-aid: Secondary | ICD-10-CM | POA: Diagnosis not present

## 2019-12-19 ENCOUNTER — Other Ambulatory Visit: Payer: Self-pay | Admitting: Neurology

## 2019-12-19 DIAGNOSIS — H9193 Unspecified hearing loss, bilateral: Secondary | ICD-10-CM | POA: Diagnosis not present

## 2019-12-19 DIAGNOSIS — R32 Unspecified urinary incontinence: Secondary | ICD-10-CM | POA: Diagnosis not present

## 2019-12-19 DIAGNOSIS — G20 Parkinson's disease: Secondary | ICD-10-CM | POA: Diagnosis not present

## 2019-12-19 DIAGNOSIS — S42201D Unspecified fracture of upper end of right humerus, subsequent encounter for fracture with routine healing: Secondary | ICD-10-CM | POA: Diagnosis not present

## 2019-12-19 DIAGNOSIS — S51811D Laceration without foreign body of right forearm, subsequent encounter: Secondary | ICD-10-CM | POA: Diagnosis not present

## 2019-12-19 DIAGNOSIS — Z974 Presence of external hearing-aid: Secondary | ICD-10-CM | POA: Diagnosis not present

## 2019-12-21 DIAGNOSIS — H9193 Unspecified hearing loss, bilateral: Secondary | ICD-10-CM | POA: Diagnosis not present

## 2019-12-21 DIAGNOSIS — G20 Parkinson's disease: Secondary | ICD-10-CM | POA: Diagnosis not present

## 2019-12-21 DIAGNOSIS — S51811D Laceration without foreign body of right forearm, subsequent encounter: Secondary | ICD-10-CM | POA: Diagnosis not present

## 2019-12-21 DIAGNOSIS — R32 Unspecified urinary incontinence: Secondary | ICD-10-CM | POA: Diagnosis not present

## 2019-12-21 DIAGNOSIS — S42201D Unspecified fracture of upper end of right humerus, subsequent encounter for fracture with routine healing: Secondary | ICD-10-CM | POA: Diagnosis not present

## 2019-12-21 DIAGNOSIS — Z974 Presence of external hearing-aid: Secondary | ICD-10-CM | POA: Diagnosis not present

## 2019-12-27 DIAGNOSIS — R32 Unspecified urinary incontinence: Secondary | ICD-10-CM | POA: Diagnosis not present

## 2019-12-27 DIAGNOSIS — H9193 Unspecified hearing loss, bilateral: Secondary | ICD-10-CM | POA: Diagnosis not present

## 2019-12-27 DIAGNOSIS — Z974 Presence of external hearing-aid: Secondary | ICD-10-CM | POA: Diagnosis not present

## 2019-12-27 DIAGNOSIS — G20 Parkinson's disease: Secondary | ICD-10-CM | POA: Diagnosis not present

## 2019-12-27 DIAGNOSIS — S42201D Unspecified fracture of upper end of right humerus, subsequent encounter for fracture with routine healing: Secondary | ICD-10-CM | POA: Diagnosis not present

## 2019-12-27 DIAGNOSIS — S51811D Laceration without foreign body of right forearm, subsequent encounter: Secondary | ICD-10-CM | POA: Diagnosis not present

## 2019-12-28 DIAGNOSIS — R32 Unspecified urinary incontinence: Secondary | ICD-10-CM | POA: Diagnosis not present

## 2019-12-28 DIAGNOSIS — S42201D Unspecified fracture of upper end of right humerus, subsequent encounter for fracture with routine healing: Secondary | ICD-10-CM | POA: Diagnosis not present

## 2019-12-28 DIAGNOSIS — Z974 Presence of external hearing-aid: Secondary | ICD-10-CM | POA: Diagnosis not present

## 2019-12-28 DIAGNOSIS — S51811D Laceration without foreign body of right forearm, subsequent encounter: Secondary | ICD-10-CM | POA: Diagnosis not present

## 2019-12-28 DIAGNOSIS — H9193 Unspecified hearing loss, bilateral: Secondary | ICD-10-CM | POA: Diagnosis not present

## 2019-12-28 DIAGNOSIS — G20 Parkinson's disease: Secondary | ICD-10-CM | POA: Diagnosis not present

## 2020-01-01 DIAGNOSIS — H9193 Unspecified hearing loss, bilateral: Secondary | ICD-10-CM | POA: Diagnosis not present

## 2020-01-01 DIAGNOSIS — G20 Parkinson's disease: Secondary | ICD-10-CM | POA: Diagnosis not present

## 2020-01-01 DIAGNOSIS — Z974 Presence of external hearing-aid: Secondary | ICD-10-CM | POA: Diagnosis not present

## 2020-01-01 DIAGNOSIS — S51811D Laceration without foreign body of right forearm, subsequent encounter: Secondary | ICD-10-CM | POA: Diagnosis not present

## 2020-01-01 DIAGNOSIS — R32 Unspecified urinary incontinence: Secondary | ICD-10-CM | POA: Diagnosis not present

## 2020-01-01 DIAGNOSIS — S42201D Unspecified fracture of upper end of right humerus, subsequent encounter for fracture with routine healing: Secondary | ICD-10-CM | POA: Diagnosis not present

## 2020-01-04 DIAGNOSIS — S42201D Unspecified fracture of upper end of right humerus, subsequent encounter for fracture with routine healing: Secondary | ICD-10-CM | POA: Diagnosis not present

## 2020-01-04 DIAGNOSIS — S51811D Laceration without foreign body of right forearm, subsequent encounter: Secondary | ICD-10-CM | POA: Diagnosis not present

## 2020-01-04 DIAGNOSIS — H9193 Unspecified hearing loss, bilateral: Secondary | ICD-10-CM | POA: Diagnosis not present

## 2020-01-04 DIAGNOSIS — Z974 Presence of external hearing-aid: Secondary | ICD-10-CM | POA: Diagnosis not present

## 2020-01-04 DIAGNOSIS — R32 Unspecified urinary incontinence: Secondary | ICD-10-CM | POA: Diagnosis not present

## 2020-01-04 DIAGNOSIS — G20 Parkinson's disease: Secondary | ICD-10-CM | POA: Diagnosis not present

## 2020-01-08 DIAGNOSIS — S42201D Unspecified fracture of upper end of right humerus, subsequent encounter for fracture with routine healing: Secondary | ICD-10-CM | POA: Diagnosis not present

## 2020-01-08 DIAGNOSIS — H9193 Unspecified hearing loss, bilateral: Secondary | ICD-10-CM | POA: Diagnosis not present

## 2020-01-08 DIAGNOSIS — G20 Parkinson's disease: Secondary | ICD-10-CM | POA: Diagnosis not present

## 2020-01-08 DIAGNOSIS — Z974 Presence of external hearing-aid: Secondary | ICD-10-CM | POA: Diagnosis not present

## 2020-01-08 DIAGNOSIS — S51811D Laceration without foreign body of right forearm, subsequent encounter: Secondary | ICD-10-CM | POA: Diagnosis not present

## 2020-01-08 DIAGNOSIS — R32 Unspecified urinary incontinence: Secondary | ICD-10-CM | POA: Diagnosis not present

## 2020-01-10 DIAGNOSIS — S51811D Laceration without foreign body of right forearm, subsequent encounter: Secondary | ICD-10-CM | POA: Diagnosis not present

## 2020-01-10 DIAGNOSIS — R413 Other amnesia: Secondary | ICD-10-CM | POA: Diagnosis not present

## 2020-01-10 DIAGNOSIS — I10 Essential (primary) hypertension: Secondary | ICD-10-CM | POA: Diagnosis not present

## 2020-01-10 DIAGNOSIS — R32 Unspecified urinary incontinence: Secondary | ICD-10-CM | POA: Diagnosis not present

## 2020-01-10 DIAGNOSIS — H9193 Unspecified hearing loss, bilateral: Secondary | ICD-10-CM | POA: Diagnosis not present

## 2020-01-10 DIAGNOSIS — Z853 Personal history of malignant neoplasm of breast: Secondary | ICD-10-CM | POA: Diagnosis not present

## 2020-01-10 DIAGNOSIS — Z9181 History of falling: Secondary | ICD-10-CM | POA: Diagnosis not present

## 2020-01-10 DIAGNOSIS — Z974 Presence of external hearing-aid: Secondary | ICD-10-CM | POA: Diagnosis not present

## 2020-01-10 DIAGNOSIS — L821 Other seborrheic keratosis: Secondary | ICD-10-CM | POA: Diagnosis not present

## 2020-01-10 DIAGNOSIS — F419 Anxiety disorder, unspecified: Secondary | ICD-10-CM | POA: Diagnosis not present

## 2020-01-10 DIAGNOSIS — E7800 Pure hypercholesterolemia, unspecified: Secondary | ICD-10-CM | POA: Diagnosis not present

## 2020-01-10 DIAGNOSIS — S42201D Unspecified fracture of upper end of right humerus, subsequent encounter for fracture with routine healing: Secondary | ICD-10-CM | POA: Diagnosis not present

## 2020-01-10 DIAGNOSIS — G20 Parkinson's disease: Secondary | ICD-10-CM | POA: Diagnosis not present

## 2020-01-10 DIAGNOSIS — N183 Chronic kidney disease, stage 3 unspecified: Secondary | ICD-10-CM | POA: Diagnosis not present

## 2020-01-10 DIAGNOSIS — Z8572 Personal history of non-Hodgkin lymphomas: Secondary | ICD-10-CM | POA: Diagnosis not present

## 2020-01-11 DIAGNOSIS — Z974 Presence of external hearing-aid: Secondary | ICD-10-CM | POA: Diagnosis not present

## 2020-01-11 DIAGNOSIS — H9193 Unspecified hearing loss, bilateral: Secondary | ICD-10-CM | POA: Diagnosis not present

## 2020-01-11 DIAGNOSIS — G20 Parkinson's disease: Secondary | ICD-10-CM | POA: Diagnosis not present

## 2020-01-11 DIAGNOSIS — S51811D Laceration without foreign body of right forearm, subsequent encounter: Secondary | ICD-10-CM | POA: Diagnosis not present

## 2020-01-11 DIAGNOSIS — R32 Unspecified urinary incontinence: Secondary | ICD-10-CM | POA: Diagnosis not present

## 2020-01-11 DIAGNOSIS — S42201D Unspecified fracture of upper end of right humerus, subsequent encounter for fracture with routine healing: Secondary | ICD-10-CM | POA: Diagnosis not present

## 2020-01-11 DIAGNOSIS — S60411A Abrasion of left index finger, initial encounter: Secondary | ICD-10-CM | POA: Diagnosis not present

## 2020-01-15 DIAGNOSIS — S42201D Unspecified fracture of upper end of right humerus, subsequent encounter for fracture with routine healing: Secondary | ICD-10-CM | POA: Diagnosis not present

## 2020-01-15 DIAGNOSIS — G20 Parkinson's disease: Secondary | ICD-10-CM | POA: Diagnosis not present

## 2020-01-15 DIAGNOSIS — S51811D Laceration without foreign body of right forearm, subsequent encounter: Secondary | ICD-10-CM | POA: Diagnosis not present

## 2020-01-15 DIAGNOSIS — R32 Unspecified urinary incontinence: Secondary | ICD-10-CM | POA: Diagnosis not present

## 2020-01-15 DIAGNOSIS — H9193 Unspecified hearing loss, bilateral: Secondary | ICD-10-CM | POA: Diagnosis not present

## 2020-01-15 DIAGNOSIS — Z974 Presence of external hearing-aid: Secondary | ICD-10-CM | POA: Diagnosis not present

## 2020-01-18 DIAGNOSIS — H9193 Unspecified hearing loss, bilateral: Secondary | ICD-10-CM | POA: Diagnosis not present

## 2020-01-18 DIAGNOSIS — Z974 Presence of external hearing-aid: Secondary | ICD-10-CM | POA: Diagnosis not present

## 2020-01-18 DIAGNOSIS — S42201D Unspecified fracture of upper end of right humerus, subsequent encounter for fracture with routine healing: Secondary | ICD-10-CM | POA: Diagnosis not present

## 2020-01-18 DIAGNOSIS — S51811D Laceration without foreign body of right forearm, subsequent encounter: Secondary | ICD-10-CM | POA: Diagnosis not present

## 2020-01-18 DIAGNOSIS — R32 Unspecified urinary incontinence: Secondary | ICD-10-CM | POA: Diagnosis not present

## 2020-01-18 DIAGNOSIS — G20 Parkinson's disease: Secondary | ICD-10-CM | POA: Diagnosis not present

## 2020-01-21 ENCOUNTER — Emergency Department (HOSPITAL_COMMUNITY): Payer: Medicare Other

## 2020-01-21 ENCOUNTER — Encounter (HOSPITAL_COMMUNITY): Payer: Self-pay | Admitting: Emergency Medicine

## 2020-01-21 ENCOUNTER — Emergency Department (HOSPITAL_COMMUNITY)
Admission: EM | Admit: 2020-01-21 | Discharge: 2020-01-21 | Disposition: A | Discharge status: Home / Self Care | Payer: Medicare Other | Attending: Emergency Medicine | Admitting: Emergency Medicine

## 2020-01-21 ENCOUNTER — Other Ambulatory Visit: Payer: Self-pay

## 2020-01-21 DIAGNOSIS — I10 Essential (primary) hypertension: Secondary | ICD-10-CM | POA: Insufficient documentation

## 2020-01-21 DIAGNOSIS — J45909 Unspecified asthma, uncomplicated: Secondary | ICD-10-CM | POA: Insufficient documentation

## 2020-01-21 DIAGNOSIS — R52 Pain, unspecified: Secondary | ICD-10-CM

## 2020-01-21 DIAGNOSIS — Y999 Unspecified external cause status: Secondary | ICD-10-CM | POA: Diagnosis not present

## 2020-01-21 DIAGNOSIS — R0789 Other chest pain: Secondary | ICD-10-CM | POA: Diagnosis not present

## 2020-01-21 DIAGNOSIS — Y9389 Activity, other specified: Secondary | ICD-10-CM | POA: Diagnosis not present

## 2020-01-21 DIAGNOSIS — Y9289 Other specified places as the place of occurrence of the external cause: Secondary | ICD-10-CM | POA: Insufficient documentation

## 2020-01-21 DIAGNOSIS — E119 Type 2 diabetes mellitus without complications: Secondary | ICD-10-CM | POA: Diagnosis not present

## 2020-01-21 HISTORY — DX: Essential (primary) hypertension: I10

## 2020-01-21 MED ORDER — HYDROCODONE-ACETAMINOPHEN 5-325 MG PO TABS
1.0000 | ORAL_TABLET | Freq: Once | ORAL | Status: AC
  Administered 2020-01-21: 1 via ORAL
  Filled 2020-01-21: qty 1

## 2020-01-21 NOTE — ED Notes (Signed)
[facility] called and en route 

## 2020-01-21 NOTE — ED Notes (Signed)
She consistently c/o low back pain "and in my hips". She moves all extremities spontaneously; including bilat. Feet and all toes. She is confused and alert at her baseline, according to her husband, who tells [place] pt. Has "Parkinson's and some dementia".
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
## 2020-01-21 NOTE — Discharge Instructions (Signed)
Please use your incentive spirometry, as directed.  I suspect that you may have a rib fracture that was missed on x-ray.  Please take extra strength Tylenol as needed for symptoms of discomfort.  Please consider anti-cough agent as coughing will exacerbate your pain symptoms.   Return to the ER or seek immediate medical attention should you experience any new or worsening symptoms.
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
## 2020-01-22 DIAGNOSIS — S51811D Laceration without foreign body of right forearm, subsequent encounter: Secondary | ICD-10-CM | POA: Diagnosis not present

## 2020-01-22 DIAGNOSIS — G20 Parkinson's disease: Secondary | ICD-10-CM | POA: Diagnosis not present

## 2020-01-22 DIAGNOSIS — S42201D Unspecified fracture of upper end of right humerus, subsequent encounter for fracture with routine healing: Secondary | ICD-10-CM | POA: Diagnosis not present

## 2020-01-22 DIAGNOSIS — H9193 Unspecified hearing loss, bilateral: Secondary | ICD-10-CM | POA: Diagnosis not present

## 2020-01-22 DIAGNOSIS — Z974 Presence of external hearing-aid: Secondary | ICD-10-CM | POA: Diagnosis not present

## 2020-01-22 DIAGNOSIS — R32 Unspecified urinary incontinence: Secondary | ICD-10-CM | POA: Diagnosis not present

## 2020-01-23 ENCOUNTER — Inpatient Hospital Stay (HOSPITAL_COMMUNITY): Payer: No Typology Code available for payment source

## 2020-01-23 ENCOUNTER — Inpatient Hospital Stay (HOSPITAL_COMMUNITY)
Admission: EM | Admit: 2020-01-23 | Discharge: 2020-02-03 | DRG: 199 | Disposition: A | Discharge status: Skilled Nursing Facility | Payer: No Typology Code available for payment source | Attending: Internal Medicine | Admitting: Internal Medicine

## 2020-01-23 ENCOUNTER — Emergency Department (HOSPITAL_COMMUNITY): Payer: No Typology Code available for payment source

## 2020-01-23 DIAGNOSIS — R0689 Other abnormalities of breathing: Secondary | ICD-10-CM | POA: Diagnosis not present

## 2020-01-23 DIAGNOSIS — G9341 Metabolic encephalopathy: Secondary | ICD-10-CM | POA: Diagnosis not present

## 2020-01-23 DIAGNOSIS — S272XXA Traumatic hemopneumothorax, initial encounter: Secondary | ICD-10-CM | POA: Diagnosis not present

## 2020-01-23 DIAGNOSIS — Z853 Personal history of malignant neoplasm of breast: Secondary | ICD-10-CM | POA: Diagnosis not present

## 2020-01-23 DIAGNOSIS — D62 Acute posthemorrhagic anemia: Secondary | ICD-10-CM | POA: Diagnosis present

## 2020-01-23 DIAGNOSIS — R0902 Hypoxemia: Secondary | ICD-10-CM | POA: Diagnosis not present

## 2020-01-23 DIAGNOSIS — F05 Delirium due to known physiological condition: Secondary | ICD-10-CM | POA: Diagnosis not present

## 2020-01-23 DIAGNOSIS — Z9889 Other specified postprocedural states: Secondary | ICD-10-CM | POA: Diagnosis not present

## 2020-01-23 DIAGNOSIS — I160 Hypertensive urgency: Secondary | ICD-10-CM

## 2020-01-23 DIAGNOSIS — Z888 Allergy status to other drugs, medicaments and biological substances status: Secondary | ICD-10-CM

## 2020-01-23 DIAGNOSIS — S2242XA Multiple fractures of ribs, left side, initial encounter for closed fracture: Secondary | ICD-10-CM | POA: Diagnosis not present

## 2020-01-23 DIAGNOSIS — Y92410 Unspecified street and highway as the place of occurrence of the external cause: Secondary | ICD-10-CM

## 2020-01-23 DIAGNOSIS — S32029A Unspecified fracture of second lumbar vertebra, initial encounter for closed fracture: Secondary | ICD-10-CM | POA: Diagnosis present

## 2020-01-23 DIAGNOSIS — R339 Retention of urine, unspecified: Secondary | ICD-10-CM | POA: Diagnosis not present

## 2020-01-23 DIAGNOSIS — R262 Difficulty in walking, not elsewhere classified: Secondary | ICD-10-CM | POA: Diagnosis not present

## 2020-01-23 DIAGNOSIS — Z66 Do not resuscitate: Secondary | ICD-10-CM | POA: Diagnosis not present

## 2020-01-23 DIAGNOSIS — K219 Gastro-esophageal reflux disease without esophagitis: Secondary | ICD-10-CM | POA: Diagnosis present

## 2020-01-23 DIAGNOSIS — Z9013 Acquired absence of bilateral breasts and nipples: Secondary | ICD-10-CM

## 2020-01-23 DIAGNOSIS — I517 Cardiomegaly: Secondary | ICD-10-CM | POA: Diagnosis not present

## 2020-01-23 DIAGNOSIS — F329 Major depressive disorder, single episode, unspecified: Secondary | ICD-10-CM | POA: Diagnosis present

## 2020-01-23 DIAGNOSIS — R531 Weakness: Secondary | ICD-10-CM

## 2020-01-23 DIAGNOSIS — N179 Acute kidney failure, unspecified: Secondary | ICD-10-CM | POA: Diagnosis present

## 2020-01-23 DIAGNOSIS — G20 Parkinson's disease: Secondary | ICD-10-CM | POA: Diagnosis present

## 2020-01-23 DIAGNOSIS — J942 Hemothorax: Secondary | ICD-10-CM

## 2020-01-23 DIAGNOSIS — R0602 Shortness of breath: Secondary | ICD-10-CM

## 2020-01-23 DIAGNOSIS — N1832 Chronic kidney disease, stage 3b: Secondary | ICD-10-CM | POA: Diagnosis present

## 2020-01-23 DIAGNOSIS — E1122 Type 2 diabetes mellitus with diabetic chronic kidney disease: Secondary | ICD-10-CM | POA: Diagnosis present

## 2020-01-23 DIAGNOSIS — R4182 Altered mental status, unspecified: Secondary | ICD-10-CM | POA: Diagnosis not present

## 2020-01-23 DIAGNOSIS — Z20822 Contact with and (suspected) exposure to covid-19: Secondary | ICD-10-CM | POA: Diagnosis present

## 2020-01-23 DIAGNOSIS — F0280 Dementia in other diseases classified elsewhere without behavioral disturbance: Secondary | ICD-10-CM | POA: Diagnosis present

## 2020-01-23 DIAGNOSIS — R079 Chest pain, unspecified: Secondary | ICD-10-CM | POA: Diagnosis not present

## 2020-01-23 DIAGNOSIS — R410 Disorientation, unspecified: Secondary | ICD-10-CM | POA: Diagnosis not present

## 2020-01-23 DIAGNOSIS — J939 Pneumothorax, unspecified: Secondary | ICD-10-CM

## 2020-01-23 DIAGNOSIS — Z741 Need for assistance with personal care: Secondary | ICD-10-CM | POA: Diagnosis not present

## 2020-01-23 DIAGNOSIS — Z8572 Personal history of non-Hodgkin lymphomas: Secondary | ICD-10-CM | POA: Diagnosis not present

## 2020-01-23 DIAGNOSIS — D649 Anemia, unspecified: Secondary | ICD-10-CM | POA: Diagnosis not present

## 2020-01-23 DIAGNOSIS — Z7401 Bed confinement status: Secondary | ICD-10-CM | POA: Diagnosis not present

## 2020-01-23 DIAGNOSIS — R1312 Dysphagia, oropharyngeal phase: Secondary | ICD-10-CM | POA: Diagnosis not present

## 2020-01-23 DIAGNOSIS — J9601 Acute respiratory failure with hypoxia: Secondary | ICD-10-CM | POA: Diagnosis present

## 2020-01-23 DIAGNOSIS — S32009D Unspecified fracture of unspecified lumbar vertebra, subsequent encounter for fracture with routine healing: Secondary | ICD-10-CM | POA: Diagnosis not present

## 2020-01-23 DIAGNOSIS — R4189 Other symptoms and signs involving cognitive functions and awareness: Secondary | ICD-10-CM | POA: Diagnosis present

## 2020-01-23 DIAGNOSIS — F339 Major depressive disorder, recurrent, unspecified: Secondary | ICD-10-CM | POA: Diagnosis not present

## 2020-01-23 DIAGNOSIS — E876 Hypokalemia: Secondary | ICD-10-CM | POA: Diagnosis present

## 2020-01-23 DIAGNOSIS — S32019A Unspecified fracture of first lumbar vertebra, initial encounter for closed fracture: Secondary | ICD-10-CM | POA: Diagnosis present

## 2020-01-23 DIAGNOSIS — S32000D Wedge compression fracture of unspecified lumbar vertebra, subsequent encounter for fracture with routine healing: Secondary | ICD-10-CM | POA: Diagnosis not present

## 2020-01-23 DIAGNOSIS — I499 Cardiac arrhythmia, unspecified: Secondary | ICD-10-CM | POA: Diagnosis not present

## 2020-01-23 DIAGNOSIS — R404 Transient alteration of awareness: Secondary | ICD-10-CM | POA: Diagnosis not present

## 2020-01-23 DIAGNOSIS — M6281 Muscle weakness (generalized): Secondary | ICD-10-CM | POA: Diagnosis not present

## 2020-01-23 DIAGNOSIS — S3991XA Unspecified injury of abdomen, initial encounter: Secondary | ICD-10-CM | POA: Diagnosis not present

## 2020-01-23 DIAGNOSIS — J45909 Unspecified asthma, uncomplicated: Secondary | ICD-10-CM | POA: Diagnosis present

## 2020-01-23 DIAGNOSIS — R509 Fever, unspecified: Secondary | ICD-10-CM | POA: Diagnosis not present

## 2020-01-23 DIAGNOSIS — M2550 Pain in unspecified joint: Secondary | ICD-10-CM | POA: Diagnosis not present

## 2020-01-23 DIAGNOSIS — J90 Pleural effusion, not elsewhere classified: Secondary | ICD-10-CM

## 2020-01-23 DIAGNOSIS — S2241XA Multiple fractures of ribs, right side, initial encounter for closed fracture: Secondary | ICD-10-CM | POA: Diagnosis not present

## 2020-01-23 DIAGNOSIS — I700 Atherosclerosis of aorta: Secondary | ICD-10-CM | POA: Diagnosis not present

## 2020-01-23 DIAGNOSIS — Z7189 Other specified counseling: Secondary | ICD-10-CM | POA: Diagnosis not present

## 2020-01-23 DIAGNOSIS — Z9689 Presence of other specified functional implants: Secondary | ICD-10-CM

## 2020-01-23 DIAGNOSIS — S2249XA Multiple fractures of ribs, unspecified side, initial encounter for closed fracture: Secondary | ICD-10-CM

## 2020-01-23 DIAGNOSIS — Z515 Encounter for palliative care: Secondary | ICD-10-CM | POA: Diagnosis not present

## 2020-01-23 DIAGNOSIS — Z801 Family history of malignant neoplasm of trachea, bronchus and lung: Secondary | ICD-10-CM

## 2020-01-23 DIAGNOSIS — I350 Nonrheumatic aortic (valve) stenosis: Secondary | ICD-10-CM | POA: Diagnosis present

## 2020-01-23 DIAGNOSIS — R278 Other lack of coordination: Secondary | ICD-10-CM | POA: Diagnosis not present

## 2020-01-23 DIAGNOSIS — R41841 Cognitive communication deficit: Secondary | ICD-10-CM | POA: Diagnosis not present

## 2020-01-23 DIAGNOSIS — R52 Pain, unspecified: Secondary | ICD-10-CM | POA: Diagnosis not present

## 2020-01-23 DIAGNOSIS — I129 Hypertensive chronic kidney disease with stage 1 through stage 4 chronic kidney disease, or unspecified chronic kidney disease: Secondary | ICD-10-CM | POA: Diagnosis present

## 2020-01-23 DIAGNOSIS — H9190 Unspecified hearing loss, unspecified ear: Secondary | ICD-10-CM | POA: Diagnosis present

## 2020-01-23 DIAGNOSIS — I728 Aneurysm of other specified arteries: Secondary | ICD-10-CM | POA: Diagnosis not present

## 2020-01-23 DIAGNOSIS — F419 Anxiety disorder, unspecified: Secondary | ICD-10-CM | POA: Diagnosis present

## 2020-01-23 DIAGNOSIS — I10 Essential (primary) hypertension: Secondary | ICD-10-CM | POA: Diagnosis not present

## 2020-01-23 DIAGNOSIS — Z79891 Long term (current) use of opiate analgesic: Secondary | ICD-10-CM

## 2020-01-23 DIAGNOSIS — S32009A Unspecified fracture of unspecified lumbar vertebra, initial encounter for closed fracture: Secondary | ICD-10-CM

## 2020-01-23 DIAGNOSIS — Z4682 Encounter for fitting and adjustment of non-vascular catheter: Secondary | ICD-10-CM

## 2020-01-23 DIAGNOSIS — S2242XD Multiple fractures of ribs, left side, subsequent encounter for fracture with routine healing: Secondary | ICD-10-CM | POA: Diagnosis not present

## 2020-01-23 DIAGNOSIS — R1310 Dysphagia, unspecified: Secondary | ICD-10-CM | POA: Diagnosis present

## 2020-01-23 DIAGNOSIS — I358 Other nonrheumatic aortic valve disorders: Secondary | ICD-10-CM | POA: Diagnosis not present

## 2020-01-23 LAB — CBC WITH DIFFERENTIAL/PLATELET
Abs Immature Granulocytes: 0.08 10*3/uL — ABNORMAL HIGH (ref 0.00–0.07)
Basophils Absolute: 0.1 10*3/uL (ref 0.0–0.1)
Basophils Relative: 1 %
Eosinophils Absolute: 0.1 10*3/uL (ref 0.0–0.5)
Eosinophils Relative: 2 %
HCT: 30.4 % — ABNORMAL LOW (ref 36.0–46.0)
Hemoglobin: 9.6 g/dL — ABNORMAL LOW (ref 12.0–15.0)
Immature Granulocytes: 1 %
Lymphocytes Relative: 10 %
Lymphs Abs: 0.7 10*3/uL (ref 0.7–4.0)
MCH: 27.4 pg (ref 26.0–34.0)
MCHC: 31.6 g/dL (ref 30.0–36.0)
MCV: 86.9 fL (ref 80.0–100.0)
Monocytes Absolute: 0.3 10*3/uL (ref 0.1–1.0)
Monocytes Relative: 5 %
Neutro Abs: 5.3 10*3/uL (ref 1.7–7.7)
Neutrophils Relative %: 81 %
Platelets: 150 10*3/uL (ref 150–400)
RBC: 3.5 MIL/uL — ABNORMAL LOW (ref 3.87–5.11)
RDW: 13.9 % (ref 11.5–15.5)
WBC: 6.5 10*3/uL (ref 4.0–10.5)
nRBC: 0 % (ref 0.0–0.2)

## 2020-01-23 LAB — TYPE AND SCREEN
ABO/RH(D): O POS
Antibody Screen: NEGATIVE

## 2020-01-23 LAB — COMPREHENSIVE METABOLIC PANEL
ALT: 24 U/L (ref 0–44)
AST: 47 U/L — ABNORMAL HIGH (ref 15–41)
Albumin: 3.5 g/dL (ref 3.5–5.0)
Alkaline Phosphatase: 91 U/L (ref 38–126)
Anion gap: 11 (ref 5–15)
BUN: 23 mg/dL (ref 8–23)
CO2: 26 mmol/L (ref 22–32)
Calcium: 8.8 mg/dL — ABNORMAL LOW (ref 8.9–10.3)
Chloride: 102 mmol/L (ref 98–111)
Creatinine, Ser: 1.25 mg/dL — ABNORMAL HIGH (ref 0.44–1.00)
GFR calc Af Amer: 44 mL/min — ABNORMAL LOW (ref >60)
GFR calc non Af Amer: 38 mL/min — ABNORMAL LOW (ref >60)
Glucose, Bld: 153 mg/dL — ABNORMAL HIGH (ref 70–99)
Potassium: 3.8 mmol/L (ref 3.5–5.1)
Sodium: 139 mmol/L (ref 135–145)
Total Bilirubin: 0.7 mg/dL (ref 0.3–1.2)
Total Protein: 6.1 g/dL — ABNORMAL LOW (ref 6.5–8.1)

## 2020-01-23 LAB — PROTIME-INR
INR: 1.1 (ref 0.8–1.2)
Prothrombin Time: 13.9 seconds (ref 11.4–15.2)

## 2020-01-23 LAB — ABO/RH: ABO/RH(D): O POS

## 2020-01-23 MED ORDER — ACETAMINOPHEN 325 MG PO TABS
650.0000 mg | ORAL_TABLET | Freq: Four times a day (QID) | ORAL | Status: DC | PRN
  Administered 2020-02-02: 650 mg via ORAL
  Filled 2020-01-23: qty 2

## 2020-01-23 MED ORDER — HYDRALAZINE HCL 20 MG/ML IJ SOLN
5.0000 mg | INTRAMUSCULAR | Status: DC | PRN
  Administered 2020-01-23 – 2020-01-24 (×2): 5 mg via INTRAVENOUS
  Filled 2020-01-23 (×2): qty 1

## 2020-01-23 MED ORDER — ACETAMINOPHEN 650 MG RE SUPP: 650.0000 mg | Freq: Four times a day (QID) | RECTAL | Status: DC | PRN

## 2020-01-23 MED ORDER — FENTANYL CITRATE (PF) 100 MCG/2ML IJ SOLN
INTRAMUSCULAR | Status: AC
  Administered 2020-01-23: 25 ug via INTRAVENOUS
  Filled 2020-01-23: qty 2

## 2020-01-23 MED ORDER — ALBUTEROL SULFATE (2.5 MG/3ML) 0.083% IN NEBU: 2.5000 mg | INHALATION_SOLUTION | Freq: Four times a day (QID) | RESPIRATORY_TRACT | Status: DC | PRN

## 2020-01-23 MED ORDER — FENTANYL CITRATE (PF) 100 MCG/2ML IJ SOLN: 25.0000 ug | Freq: Once | INTRAMUSCULAR | Status: AC

## 2020-01-23 MED ORDER — SODIUM CHLORIDE 0.9 % IV SOLN: INTRAVENOUS | Status: AC

## 2020-01-23 MED ORDER — MORPHINE SULFATE (PF) 2 MG/ML IV SOLN
1.0000 mg | INTRAVENOUS | Status: DC | PRN
  Administered 2020-01-23 – 2020-01-24 (×2): 1 mg via INTRAVENOUS
  Filled 2020-01-23 (×2): qty 1
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
## 2020-01-23 NOTE — ED Notes (Signed)
Taken to CT.
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
## 2020-01-23 NOTE — ED Notes (Signed)
Pt son would like and update. Pt son stated that husband was not sure of all that was said between staff and himself.  [name] [phone]
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
## 2020-01-24 ENCOUNTER — Inpatient Hospital Stay (HOSPITAL_COMMUNITY): Payer: No Typology Code available for payment source

## 2020-01-24 LAB — CBC
HCT: 25.8 % — ABNORMAL LOW (ref 36.0–46.0)
Hemoglobin: 8.4 g/dL — ABNORMAL LOW (ref 12.0–15.0)
MCH: 27.9 pg (ref 26.0–34.0)
MCHC: 32.6 g/dL (ref 30.0–36.0)
MCV: 85.7 fL (ref 80.0–100.0)
Platelets: 132 10*3/uL — ABNORMAL LOW (ref 150–400)
RBC: 3.01 MIL/uL — ABNORMAL LOW (ref 3.87–5.11)
RDW: 13.9 % (ref 11.5–15.5)
WBC: 5.7 10*3/uL (ref 4.0–10.5)
nRBC: 0 % (ref 0.0–0.2)

## 2020-01-24 LAB — BASIC METABOLIC PANEL
Anion gap: 9 (ref 5–15)
BUN: 15 mg/dL (ref 8–23)
CO2: 25 mmol/L (ref 22–32)
Calcium: 8.6 mg/dL — ABNORMAL LOW (ref 8.9–10.3)
Chloride: 108 mmol/L (ref 98–111)
Creatinine, Ser: 0.81 mg/dL (ref 0.44–1.00)
GFR calc Af Amer: >60 mL/min (ref >60)
GFR calc non Af Amer: >60 mL/min (ref >60)
Glucose, Bld: 118 mg/dL — ABNORMAL HIGH (ref 70–99)
Potassium: 3.9 mmol/L (ref 3.5–5.1)
Sodium: 142 mmol/L (ref 135–145)

## 2020-01-24 LAB — CBC WITH DIFFERENTIAL/PLATELET
Abs Immature Granulocytes: 0.05 10*3/uL (ref 0.00–0.07)
Basophils Absolute: 0 10*3/uL (ref 0.0–0.1)
Basophils Relative: 1 %
Eosinophils Absolute: 0.1 10*3/uL (ref 0.0–0.5)
Eosinophils Relative: 1 %
HCT: 27.2 % — ABNORMAL LOW (ref 36.0–46.0)
Hemoglobin: 8.8 g/dL — ABNORMAL LOW (ref 12.0–15.0)
Immature Granulocytes: 1 %
Lymphocytes Relative: 13 %
Lymphs Abs: 0.7 10*3/uL (ref 0.7–4.0)
MCH: 27.7 pg (ref 26.0–34.0)
MCHC: 32.4 g/dL (ref 30.0–36.0)
MCV: 85.5 fL (ref 80.0–100.0)
Monocytes Absolute: 0.3 10*3/uL (ref 0.1–1.0)
Monocytes Relative: 4 %
Neutro Abs: 4.7 10*3/uL (ref 1.7–7.7)
Neutrophils Relative %: 80 %
Platelets: 137 10*3/uL — ABNORMAL LOW (ref 150–400)
RBC: 3.18 MIL/uL — ABNORMAL LOW (ref 3.87–5.11)
RDW: 13.9 % (ref 11.5–15.5)
WBC: 5.9 10*3/uL (ref 4.0–10.5)
nRBC: 0 % (ref 0.0–0.2)

## 2020-01-24 LAB — MRSA PCR SCREENING: MRSA by PCR: NEGATIVE

## 2020-01-24 MED ORDER — CARBIDOPA-LEVODOPA 25-100 MG PO TABS
1.0000 | ORAL_TABLET | Freq: Every day | ORAL | Status: DC
  Administered 2020-01-25 – 2020-02-02 (×9): 1 via ORAL
  Filled 2020-01-24 (×11): qty 1

## 2020-01-24 MED ORDER — VITAMIN D 25 MCG (1000 UNIT) PO TABS
1000.0000 [IU] | ORAL_TABLET | Freq: Every evening | ORAL | Status: DC
  Administered 2020-01-24 – 2020-02-02 (×7): 1000 [IU] via ORAL
  Filled 2020-01-24 (×8): qty 1

## 2020-01-24 MED ORDER — AMLODIPINE BESYLATE 5 MG PO TABS
5.0000 mg | ORAL_TABLET | Freq: Every day | ORAL | Status: DC
  Administered 2020-01-26 – 2020-01-27 (×2): 5 mg via ORAL
  Filled 2020-01-24 (×4): qty 1

## 2020-01-24 MED ORDER — HYDRALAZINE HCL 20 MG/ML IJ SOLN
10.0000 mg | INTRAMUSCULAR | Status: DC | PRN
  Administered 2020-01-24 – 2020-01-28 (×3): 10 mg via INTRAVENOUS
  Filled 2020-01-24 (×4): qty 1

## 2020-01-24 MED ORDER — METHOCARBAMOL 1000 MG/10ML IJ SOLN
500.0000 mg | Freq: Three times a day (TID) | INTRAVENOUS | Status: DC
  Administered 2020-01-24 – 2020-01-27 (×9): 500 mg via INTRAVENOUS
  Filled 2020-01-24: qty 5
  Filled 2020-01-24: qty 500
  Filled 2020-01-24 (×4): qty 5
  Filled 2020-01-24 (×3): qty 500
  Filled 2020-01-24 (×2): qty 5
  Filled 2020-01-24: qty 500
  Filled 2020-01-24 (×2): qty 5

## 2020-01-24 MED ORDER — MORPHINE SULFATE (PF) 2 MG/ML IV SOLN
1.0000 mg | Freq: Four times a day (QID) | INTRAVENOUS | Status: DC | PRN
  Administered 2020-01-24 – 2020-01-25 (×3): 1 mg via INTRAVENOUS
  Filled 2020-01-24 (×3): qty 1

## 2020-01-24 MED ORDER — CARBIDOPA-LEVODOPA 25-100 MG PO TABS
1.0000 | ORAL_TABLET | Freq: Three times a day (TID) | ORAL | Status: DC
  Administered 2020-01-24 – 2020-02-03 (×24): 1 via ORAL
  Filled 2020-01-24 (×33): qty 1

## 2020-01-24 MED ORDER — OXYCODONE HCL 5 MG PO TABS: 2.5000 mg | ORAL_TABLET | Freq: Four times a day (QID) | ORAL | Status: DC | PRN

## 2020-01-24 MED ORDER — SERTRALINE HCL 100 MG PO TABS
100.0000 mg | ORAL_TABLET | Freq: Every day | ORAL | Status: DC
  Administered 2020-01-26 – 2020-02-03 (×9): 100 mg via ORAL
  Filled 2020-01-24 (×11): qty 1

## 2020-01-24 NOTE — Progress Notes (Signed)
Pt unable to do flutter at this time due to not understanding instructions.
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
## 2020-01-24 NOTE — Progress Notes (Signed)
Pt admitted to floor via ED. Pt was A&Ox1 (self). Vital signs taken and recorded. Placed chest tube to low intermittent suction. Oriented Patient to room, placed the bed in lowest position, bed alarm on and call bell in reach. Pts family was at the bedside and oriented them to the unit and visitation policy.   Sharin Mons, RN
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
## 2020-01-25 ENCOUNTER — Inpatient Hospital Stay (HOSPITAL_COMMUNITY): Payer: No Typology Code available for payment source

## 2020-01-25 LAB — BASIC METABOLIC PANEL
Anion gap: 9 (ref 5–15)
BUN: 15 mg/dL (ref 8–23)
CO2: 24 mmol/L (ref 22–32)
Calcium: 8.7 mg/dL — ABNORMAL LOW (ref 8.9–10.3)
Chloride: 110 mmol/L (ref 98–111)
Creatinine, Ser: 0.79 mg/dL (ref 0.44–1.00)
GFR calc Af Amer: >60 mL/min (ref >60)
GFR calc non Af Amer: >60 mL/min (ref >60)
Glucose, Bld: 109 mg/dL — ABNORMAL HIGH (ref 70–99)
Potassium: 3.5 mmol/L (ref 3.5–5.1)
Sodium: 143 mmol/L (ref 135–145)

## 2020-01-25 LAB — HEMOGLOBIN A1C
Hgb A1c MFr Bld: 5.2 % (ref 4.8–5.6)
Mean Plasma Glucose: 103 mg/dL

## 2020-01-25 MED ORDER — RESOURCE THICKENUP CLEAR PO POWD
ORAL | Status: DC | PRN
  Filled 2020-01-25: qty 125

## 2020-01-25 NOTE — Progress Notes (Signed)
RN attempted to remove upper denture overnight however could not get them out. Patient unable to assist.
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
## 2020-01-25 NOTE — Progress Notes (Addendum)
Patient having difficulty swallowing fluids from a syringe held po med held. Apresoline 10 mg given for SBP 170's. Patient more alert toward the end of the shift and answered questions appropriately.
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
## 2020-01-26 ENCOUNTER — Inpatient Hospital Stay (HOSPITAL_COMMUNITY): Payer: No Typology Code available for payment source

## 2020-01-26 LAB — CBC WITH DIFFERENTIAL/PLATELET
Abs Immature Granulocytes: 0.04 10*3/uL (ref 0.00–0.07)
Basophils Absolute: 0.1 10*3/uL (ref 0.0–0.1)
Basophils Relative: 1 %
Eosinophils Absolute: 0.3 10*3/uL (ref 0.0–0.5)
Eosinophils Relative: 6 %
HCT: 27.4 % — ABNORMAL LOW (ref 36.0–46.0)
Hemoglobin: 8.7 g/dL — ABNORMAL LOW (ref 12.0–15.0)
Immature Granulocytes: 1 %
Lymphocytes Relative: 18 %
Lymphs Abs: 0.9 10*3/uL (ref 0.7–4.0)
MCH: 27.8 pg (ref 26.0–34.0)
MCHC: 31.8 g/dL (ref 30.0–36.0)
MCV: 87.5 fL (ref 80.0–100.0)
Monocytes Absolute: 0.3 10*3/uL (ref 0.1–1.0)
Monocytes Relative: 7 %
Neutro Abs: 3.5 10*3/uL (ref 1.7–7.7)
Neutrophils Relative %: 67 %
Platelets: 137 10*3/uL — ABNORMAL LOW (ref 150–400)
RBC: 3.13 MIL/uL — ABNORMAL LOW (ref 3.87–5.11)
RDW: 14.6 % (ref 11.5–15.5)
WBC: 5.1 10*3/uL (ref 4.0–10.5)
nRBC: 0 % (ref 0.0–0.2)

## 2020-01-26 LAB — BASIC METABOLIC PANEL
Anion gap: 8 (ref 5–15)
BUN: 18 mg/dL (ref 8–23)
CO2: 24 mmol/L (ref 22–32)
Calcium: 8.4 mg/dL — ABNORMAL LOW (ref 8.9–10.3)
Chloride: 111 mmol/L (ref 98–111)
Creatinine, Ser: 0.79 mg/dL (ref 0.44–1.00)
GFR calc Af Amer: >60 mL/min (ref >60)
GFR calc non Af Amer: >60 mL/min (ref >60)
Glucose, Bld: 92 mg/dL (ref 70–99)
Potassium: 4 mmol/L (ref 3.5–5.1)
Sodium: 143 mmol/L (ref 135–145)

## 2020-01-26 LAB — SARS CORONAVIRUS 2 BY RT PCR (HOSPITAL ORDER, PERFORMED IN ~~LOC~~ HOSPITAL LAB): SARS Coronavirus 2: NEGATIVE

## 2020-01-26 MED ORDER — WHITE PETROLATUM EX OINT
TOPICAL_OINTMENT | CUTANEOUS | Status: AC
  Filled 2020-01-26: qty 28.35

## 2020-01-26 MED ORDER — SODIUM CHLORIDE 0.9 % IV SOLN: INTRAVENOUS | Status: DC
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
## 2020-01-26 NOTE — NC FL2 (Addendum)
[place] LEVEL OF CARE SCREENING TOOL     IDENTIFICATION  Patient Name: [name] Birthdate: Feb 02, 1928 Sex: female Admission Date (Current Location): 01/23/2020  [facility] and [place] Number:  Herbalist and Address:  [facility] [place], [place] [address], [place], [place] [address]      Provider Number: [id number]  Attending Physician Name and Address:  Darliss Cheney, MD  Relative Name and Phone Number:  [name], spouse, [phone]    Current Level of Care: Hospital Recommended Level of Care: [place] Prior Approval Number:    Date Approved/Denied:   PASRR Number: [id number]  Discharge Plan: SNF    Current Diagnoses: Patient Active Problem List   Diagnosis Date Noted  . Hemothorax 01/23/2020  . Multiple rib fractures 01/23/2020  . Lumbar transverse process fracture ([place]) 01/23/2020  . Hypertensive urgency 01/23/2020  . Anemia 01/23/2020  . Closed fracture of right proximal humerus 05/31/2019  . GIB (gastrointestinal bleeding) 02/03/2017  . CAP (community acquired pneumonia) 04/13/2016  . PNA (pneumonia) 04/13/2016  . Murmur 09/02/2015  . Parkinson disease ([place]) 09/02/2015  . Bilateral carotid bruits 09/02/2015  . Confusion 08/07/2015  . Acute encephalopathy 08/07/2015  . UTI (lower urinary tract infection) 08/07/2015  . Acute kidney failure ([place]) 08/07/2015  . Paralysis agitans ([place]) 09/19/2012  . Lymphoma ([place]) 07/03/2011  . Malignant neoplasm of female breast ([place]) 07/03/2011  . Abnormal CT of the chest 03/13/2011  . HYPERTENSION, BENIGN 01/01/2009  . EDEMA 01/01/2009    Orientation RESPIRATION BLADDER Height & Weight     Self, Place  O2 (Nasal cannula 2L) Continent, External catheter Weight:   Height:     BEHAVIORAL SYMPTOMS/MOOD NEUROLOGICAL BOWEL NUTRITION STATUS      Continent Diet (Please see DC Summary)  AMBULATORY STATUS COMMUNICATION OF NEEDS Skin   Extensive Assist Verbally  Normal                       Personal Care Assistance Level of Assistance  Bathing, Feeding, Dressing Bathing Assistance: Maximum assistance Feeding assistance: Maximum assistance Dressing Assistance: Maximum assistance     Functional Limitations Info  Sight, Hearing, Speech Sight Info: Adequate Hearing Info: Impaired Speech Info: Adequate    SPECIAL CARE FACTORS FREQUENCY  PT (By licensed PT), OT (By licensed OT)     PT Frequency: 5x/week OT Frequency: 5x/week            Contractures Contractures Info: Not present    Additional Factors Info  Code Status, Allergies, Psychotropic Code Status Info: Full Allergies Info: Azithromycin, Tramadol Psychotropic Info: Sinemet; Zoloft         Current Medications (01/26/2020):  This is the current hospital active medication list Current Facility-Administered Medications  Medication Dose Route Frequency Provider Last Rate Last Admin  . 0.9 %  sodium chloride infusion   Intravenous Continuous Darliss Cheney, MD 75 mL/hr at 01/26/20 0925 New Bag at 01/26/20 0925  . acetaminophen (TYLENOL) tablet 650 mg  650 mg Oral Q6H PRN Shela Leff, MD       Or  . acetaminophen (TYLENOL) suppository 650 mg  650 mg Rectal Q6H PRN Shela Leff, MD      . albuterol (PROVENTIL) (2.5 MG/3ML) 0.083% nebulizer solution 2.5 mg  2.5 mg Nebulization Q6H PRN Shela Leff, MD      . amLODipine (NORVASC) tablet 5 mg  5 mg Oral Daily Darliss Cheney, MD   5 mg at 01/26/20 0843  . carbidopa-levodopa (  SINEMET IR) 25-100 MG per tablet immediate release 1 tablet  1 tablet Oral TID Darliss Cheney, MD   1 tablet at 01/26/20 0842   And  . carbidopa-levodopa (SINEMET IR) 25-100 MG per tablet immediate release 1 tablet  1 tablet Oral QHS Darliss Cheney, MD   1 tablet at 01/25/20 2123  . cholecalciferol (VITAMIN D3) tablet 1,000 Units  1,000 Units Oral QPM Darliss Cheney, MD   1,000 Units at 01/24/20 1813  . hydrALAZINE (APRESOLINE) injection 10 mg  10  mg Intravenous Q4H PRN Darliss Cheney, MD   10 mg at 01/25/20 0604  . methocarbamol (ROBAXIN) 500 mg in dextrose 5 % 50 mL IVPB  500 mg Intravenous Q8H Jesusita Oka, MD 100 mL/hr at 01/26/20 0520 500 mg at 01/26/20 0520  . morphine 2 MG/ML injection 1 mg  1 mg Intravenous Q6H PRN Meuth, Brooke A, PA-C   1 mg at 01/25/20 1721  . oxyCODONE (Oxy IR/ROXICODONE) immediate release tablet 2.5-5 mg  2.5-5 mg Oral Q6H PRN Meuth, Brooke A, PA-C      . Resource ThickenUp Clear   Oral PRN Darliss Cheney, MD      . sertraline (ZOLOFT) tablet 100 mg  100 mg Oral Daily Darliss Cheney, MD   100 mg at 01/26/20 1219     Discharge Medications: Please see discharge summary for a list of discharge medications.  Relevant Imaging Results:  Relevant Lab Results:   Additional Information SSn: [place], [place]
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
## 2020-01-26 NOTE — Discharge Instructions (Signed)
Hemothorax  Hemothorax is a buildup of blood in the space between the lungs and the chest wall (pleural cavity). This can cause trouble breathing, a collapsed lung (pneumothorax), and other dangerous problems. This condition is a medical emergency that must be treated right away in a hospital. What are the causes? This condition is most often caused by an injury (trauma) that causes a tear in a lung or in a blood vessel in the chest. Other possible causes include:  Tuberculosis.  An injury caused by placing a tube into a blood vessel in the chest (central venous catheter).  Cancer in the chest.  A problem with how your blood clots.  Blood thinner (anticoagulant) medicines.  Lung surgery or heart surgery. What are the signs or symptoms? Signs and symptoms may include:  Rapid breathing.  Difficulty breathing.  Shortness of breath.  Feeling light-headed.  Anxiety.  Restlessness.  A rapid heart rate.  Low blood pressure (hypotension).  Chest pain.  Skin that is cool, sweaty, pale, or blue. How is this diagnosed? This condition may be diagnosed based on:  Your symptoms and medical history. Your health care provider may ask about any recent injuries you have had.  A physical exam.  A chest X-ray.  Removal and testing of a blood sample from the pleural cavity. How is this treated? This condition must be treated at the hospital. Treatment may include:  A procedure to place a small tube into the pleural cavity (chest tube). The chest tube drains fluid, blood, and extra air. It can also be used to expand a pneumothorax, if needed.  Surgery to open the chest and control bleeding (thoracotomy). You may need surgery if bleeding continues after you have a chest tube placed.  IV fluids.  Blood transfusion. You may receive: ? Your own blood that is collected from a chest drainage device and infused back into your body (autotransfusion). ? Blood from a donor (blood  transfusion). Follow these instructions at home: Medicines  Take over-the-counter and prescription medicines only as told by your health care provider.  Do not drive or use heavy machinery while taking prescription pain medicine. General instructions  Return to your normal activities as told by your health care provider. Ask your health care provider what activities are safe for you.  If a cough or pain makes it difficult for you to sleep at night, try sleeping in a semi-upright position in a recliner or by using 2 or 3 pillows.  If you were given an incentive spirometer, use it every 1-2 hours while you are awake or as recommended by your health care provider. This device measures how well you are filling your lungs with each breath.  If you had a chest tube and it was removed, ask your health care provider when you can remove the bandage (dressing). While the dressing is in place, do not allow it to get wet.  Keep all follow-up visits as told by your health care provider. This is important.  Do not use any products that contain nicotine or tobacco, such as cigarettes and e-cigarettes. If you need help quitting, ask your health care provider. Contact a health care provider if:  Your pain is not controlled with the medicines you were prescribed.  You were treated with a chest tube, and you have redness, increasing pain, or discharge at the site where it was placed.  You have a fever. Get help right away if:  You have difficulty breathing.  You begin coughing up  blood.  You have chest pain. These symptoms may represent a serious problem that is an emergency. Do not wait to see if the symptoms will go away. Get medical help right away. Call your local emergency services (911 in the [place] Do not drive yourself to the hospital. Summary  Hemothorax is a buildup of blood in the space between the lungs and the chest wall (pleural cavity).  This condition is a medical emergency that  must be treated in a hospital right away.  Hemothorax is most often caused by an injury (trauma) that causes a tear in a lung or in a blood vessel in the chest. Other possible causes include tuberculosis, cancer, blood thinner (anticoagulant) medicines, lung or heart surgery, or a problem with how your blood clots.  Treatment includes receiving donated blood (blood transfusion) and having a small tube placed in the pleural cavity (chest tube). The tube can help drain blood, fluid, and extra air. It can also be used to expand a collapsed lung (pneumothorax).  In some cases, surgery is needed to stop the bleeding. This information is not intended to replace advice given to you by your health care provider. Make sure you discuss any questions you have with your health care provider. Document Revised: 08/09/2017 Document Reviewed: 08/09/2017 Elsevier Patient Education  [place]. Rib Fracture  A rib fracture is a break or crack in one of the bones of the ribs. The ribs are like a cage that goes around your upper chest. A broken or cracked rib is often painful, but most do not cause other problems. Most rib fractures usually heal on their own in 1-3 months. Follow these instructions at home: Managing pain, stiffness, and swelling  If directed, apply ice to the injured area. ? Put ice in a plastic bag. ? Place a towel between your skin and the bag. ? Leave the ice on for 20 minutes, 2-3 times a day.  Take over-the-counter and prescription medicines only as told by your doctor. Activity  Avoid activities that cause pain to the injured area. Protect your injured area.  Slowly increase activity as told by your doctor. General instructions  Do deep breathing as told by your doctor. You may be told to: ? Take deep breaths many times a day. ? Cough many times a day while hugging a pillow. ? Use a device (incentive spirometer) to do deep breathing many times a day.  Drink enough fluid  to keep your pee (urine) clear or pale yellow.  Do not wear a rib belt or binder. These do not allow you to breathe deeply.  Keep all follow-up visits as told by your doctor. This is important. Contact a doctor if:  You have a fever. Get help right away if:  You have trouble breathing.  You are short of breath.  You cannot stop coughing.  You cough up thick or bloody spit (sputum).  You feel sick to your stomach (nauseous), throw up (vomit), or have belly (abdominal) pain.  Your pain gets worse and medicine does not help. Summary  A rib fracture is a break or crack in one of the bones of the ribs.  Apply ice to the injured area and take medicines for pain as told by your doctor.  Take deep breaths and cough many times a day. Hug a pillow every time you cough. This information is not intended to replace advice given to you by your health care provider. Make sure you discuss any questions you  have with your health care provider. Document Revised: 06/25/2017 Document Reviewed: 10/13/2016 Elsevier Patient Education  2020 [employer].
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
## 2020-01-26 NOTE — Progress Notes (Signed)
RE: [name] Date of Birth: 08/17/1927 Date: 01/26/20  Please be advised that the above-named patient will require a short-term nursing home stay - anticipated 30 days or less for rehabilitation and strengthening.  The plan is for return home.

## 2020-01-26 NOTE — TOC CAGE-AID Note (Signed)
Transition of Care [facility]) - CAGE-AID Screening   Patient Details  Name: [name] MRN: [id number] Date of Birth: August 21, 1927  Transition of Care [facility]) CM/SW Contact:    Emeterio Reeve, [place] Phone Number: 01/26/2020, 11:12 AM   Clinical Narrative:  Pt was unable to participate in assessment due to only being oriented to person.   CAGE-AID Screening: Substance Abuse Screening unable to be completed due to: : Patient unable to participate            [place] Clinical Social Worker [phone]
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
## 2020-01-27 LAB — BASIC METABOLIC PANEL
Anion gap: 8 (ref 5–15)
BUN: 15 mg/dL (ref 8–23)
CO2: 21 mmol/L — ABNORMAL LOW (ref 22–32)
Calcium: 8.2 mg/dL — ABNORMAL LOW (ref 8.9–10.3)
Chloride: 115 mmol/L — ABNORMAL HIGH (ref 98–111)
Creatinine, Ser: 0.71 mg/dL (ref 0.44–1.00)
GFR calc Af Amer: >60 mL/min (ref >60)
GFR calc non Af Amer: >60 mL/min (ref >60)
Glucose, Bld: 103 mg/dL — ABNORMAL HIGH (ref 70–99)
Potassium: 3.4 mmol/L — ABNORMAL LOW (ref 3.5–5.1)
Sodium: 144 mmol/L (ref 135–145)

## 2020-01-27 MED ORDER — SODIUM CHLORIDE 0.9 % IV SOLN: INTRAVENOUS | Status: DC

## 2020-01-27 MED ORDER — AMLODIPINE BESYLATE 10 MG PO TABS
10.0000 mg | ORAL_TABLET | Freq: Every day | ORAL | Status: DC
  Administered 2020-01-28 – 2020-02-03 (×7): 10 mg via ORAL
  Filled 2020-01-27 (×7): qty 1

## 2020-01-27 MED ORDER — METHOCARBAMOL 500 MG PO TABS
500.0000 mg | ORAL_TABLET | Freq: Three times a day (TID) | ORAL | Status: DC
  Administered 2020-01-27 – 2020-02-03 (×20): 500 mg via ORAL
  Filled 2020-01-27 (×21): qty 1

## 2020-01-27 MED ORDER — POTASSIUM CHLORIDE 20 MEQ PO PACK
40.0000 meq | PACK | Freq: Once | ORAL | Status: AC
  Administered 2020-01-27: 40 meq via ORAL
  Filled 2020-01-27: qty 2

## 2020-01-27 NOTE — Progress Notes (Signed)
0400: Pt having trouble urinating on own tonight. Urinated 236mls into purwick canister.  0500: Bladder scan showed 574mls left in bladder. On-call provider notified and new order to in and out cath received. In and out cath performed and 767mls output. Post residual bladder scan showed 21mls. Patient placed back on purwick.
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
## 2020-01-27 NOTE — Progress Notes (Signed)
Son visiting and asked in husband's presence and approval to be able to have his wife--who used to be a SW At [facility] to the CM or SW  regarding the POC so she could explain to the family members. [name] McClaughlin [phone] all day Monday or after 3 pm the rest of the week.  Simmie Davies RN
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
## 2020-01-28 ENCOUNTER — Inpatient Hospital Stay (HOSPITAL_COMMUNITY): Payer: No Typology Code available for payment source

## 2020-01-28 LAB — URINALYSIS, ROUTINE W REFLEX MICROSCOPIC
Bilirubin Urine: NEGATIVE
Glucose, UA: 150 mg/dL — AB
Ketones, ur: 5 mg/dL — AB
Nitrite: POSITIVE — AB
Protein, ur: NEGATIVE mg/dL
Specific Gravity, Urine: 1.019 (ref 1.005–1.030)
pH: 5 (ref 5.0–8.0)

## 2020-01-28 LAB — BASIC METABOLIC PANEL
Anion gap: 9 (ref 5–15)
BUN: 11 mg/dL (ref 8–23)
CO2: 19 mmol/L — ABNORMAL LOW (ref 22–32)
Calcium: 8.2 mg/dL — ABNORMAL LOW (ref 8.9–10.3)
Chloride: 115 mmol/L — ABNORMAL HIGH (ref 98–111)
Creatinine, Ser: 0.63 mg/dL (ref 0.44–1.00)
GFR calc Af Amer: >60 mL/min (ref >60)
GFR calc non Af Amer: >60 mL/min (ref >60)
Glucose, Bld: 107 mg/dL — ABNORMAL HIGH (ref 70–99)
Potassium: 3.7 mmol/L (ref 3.5–5.1)
Sodium: 143 mmol/L (ref 135–145)

## 2020-01-28 MED ORDER — TAMSULOSIN HCL 0.4 MG PO CAPS
0.4000 mg | ORAL_CAPSULE | Freq: Every day | ORAL | Status: DC
  Administered 2020-01-29 – 2020-02-03 (×6): 0.4 mg via ORAL
  Filled 2020-01-28 (×7): qty 1

## 2020-01-28 MED ORDER — DEXTROSE 5 % IV SOLN: INTRAVENOUS | Status: DC

## 2020-01-28 MED ORDER — QUETIAPINE FUMARATE 50 MG PO TABS
25.0000 mg | ORAL_TABLET | Freq: Every day | ORAL | Status: DC
  Administered 2020-01-28 – 2020-02-02 (×6): 25 mg via ORAL
  Filled 2020-01-28 (×6): qty 1

## 2020-01-28 MED ORDER — CHLORHEXIDINE GLUCONATE CLOTH 2 % EX PADS
6.0000 | MEDICATED_PAD | Freq: Every day | CUTANEOUS | Status: DC
  Administered 2020-01-29 – 2020-01-30 (×2): 6 via TOPICAL

## 2020-01-28 NOTE — Progress Notes (Signed)
US performed showing 524 ml of urine in the bladder.  In and Out cath performed using 16 F cath using sterile technique with 700 ml dark yellow urine.  Patient tolerated procedure fairly well with 2 assist.  Patient noted to have small amount of watery stool and reddened buttocks areas.  Moisture barrier cream applied to reddened areas and perineum.

## 2020-01-28 NOTE — Progress Notes (Signed)
Patient was more confused today, RR elevated, Delirium score 6, No urine output. Discussed with provider at am rounds of UOP. Notified provider again when MEWS changed and LOC changed with confusion, high RR, Low grade temp, no UOP. Bladder scans with I&O cath for 425. Foley cath then ordered but was unable to insert successfully after two attempts. UA and urine culture pending foley placement. Reported this to oncoming [employer]. Kept in communication with provide via secure chat. CXR performed and consult to surgical Dr. Grandville Silos. Provider tried to contact husband but said calls were denied. Husband did visit today. RN asked if code states had been addressed and it has not. MEWS scores documented, provider and CN notified, orders received and any incomplete orders such as Foley reported to oncoming RN. Provider stated at 1800 to continue to monitor and try to place Foley and specimens. Rounded with oncoming RN. Simmie Davies RN
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
## 2020-01-28 NOTE — Progress Notes (Signed)
Patient ID: [name], female   DOB: Nov 23, 1927, 86 y.o.   MRN: [id number] Chest tube D/Cd 7/3 and Trauma S.O. I was notified by Tawanna Solo today that her CXR showed L effusion  now moderate size (increased from previous). I also reviewed the film. I will ask IR for U/S guided thoracentesis and we will follow again.  Georganna Skeans, MD, MPH, FACS Please use [email or web address] to contact on call provider

## 2020-01-29 DIAGNOSIS — J90 Pleural effusion, not elsewhere classified: Secondary | ICD-10-CM

## 2020-01-29 DIAGNOSIS — J942 Hemothorax: Secondary | ICD-10-CM

## 2020-01-29 DIAGNOSIS — Z7189 Other specified counseling: Secondary | ICD-10-CM

## 2020-01-29 DIAGNOSIS — G20 Parkinson's disease: Secondary | ICD-10-CM

## 2020-01-29 DIAGNOSIS — R531 Weakness: Secondary | ICD-10-CM

## 2020-01-29 DIAGNOSIS — Z515 Encounter for palliative care: Secondary | ICD-10-CM

## 2020-01-29 DIAGNOSIS — Z66 Do not resuscitate: Secondary | ICD-10-CM

## 2020-01-29 LAB — BASIC METABOLIC PANEL
Anion gap: 7 (ref 5–15)
BUN: 10 mg/dL (ref 8–23)
CO2: 21 mmol/L — ABNORMAL LOW (ref 22–32)
Calcium: 8.3 mg/dL — ABNORMAL LOW (ref 8.9–10.3)
Chloride: 114 mmol/L — ABNORMAL HIGH (ref 98–111)
Creatinine, Ser: 0.72 mg/dL (ref 0.44–1.00)
GFR calc Af Amer: >60 mL/min (ref >60)
GFR calc non Af Amer: >60 mL/min (ref >60)
Glucose, Bld: 147 mg/dL — ABNORMAL HIGH (ref 70–99)
Potassium: 3.5 mmol/L (ref 3.5–5.1)
Sodium: 142 mmol/L (ref 135–145)

## 2020-01-29 LAB — CBC WITH DIFFERENTIAL/PLATELET
Abs Immature Granulocytes: 0.05 10*3/uL (ref 0.00–0.07)
Basophils Absolute: 0 10*3/uL (ref 0.0–0.1)
Basophils Relative: 0 %
Eosinophils Absolute: 0.1 10*3/uL (ref 0.0–0.5)
Eosinophils Relative: 2 %
HCT: 23.1 % — ABNORMAL LOW (ref 36.0–46.0)
Hemoglobin: 7.3 g/dL — ABNORMAL LOW (ref 12.0–15.0)
Immature Granulocytes: 1 %
Lymphocytes Relative: 8 %
Lymphs Abs: 0.6 10*3/uL — ABNORMAL LOW (ref 0.7–4.0)
MCH: 28.1 pg (ref 26.0–34.0)
MCHC: 31.6 g/dL (ref 30.0–36.0)
MCV: 88.8 fL (ref 80.0–100.0)
Monocytes Absolute: 0.5 10*3/uL (ref 0.1–1.0)
Monocytes Relative: 7 %
Neutro Abs: 5.7 10*3/uL (ref 1.7–7.7)
Neutrophils Relative %: 82 %
Platelets: 165 10*3/uL (ref 150–400)
RBC: 2.6 MIL/uL — ABNORMAL LOW (ref 3.87–5.11)
RDW: 14.7 % (ref 11.5–15.5)
WBC: 6.9 10*3/uL (ref 4.0–10.5)
nRBC: 0 % (ref 0.0–0.2)

## 2020-01-29 LAB — GLUCOSE, CAPILLARY: Glucose-Capillary: 113 mg/dL — ABNORMAL HIGH (ref 70–99)
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
## 2020-01-30 ENCOUNTER — Inpatient Hospital Stay (HOSPITAL_COMMUNITY): Payer: No Typology Code available for payment source

## 2020-01-30 HISTORY — PX: IR THORACENTESIS ASP PLEURAL SPACE W/IMG GUIDE: IMG5380

## 2020-01-30 LAB — CBC WITH DIFFERENTIAL/PLATELET
Abs Immature Granulocytes: 0.07 10*3/uL (ref 0.00–0.07)
Basophils Absolute: 0 10*3/uL (ref 0.0–0.1)
Basophils Relative: 0 %
Eosinophils Absolute: 0.2 10*3/uL (ref 0.0–0.5)
Eosinophils Relative: 2 %
HCT: 22.8 % — ABNORMAL LOW (ref 36.0–46.0)
Hemoglobin: 7.2 g/dL — ABNORMAL LOW (ref 12.0–15.0)
Immature Granulocytes: 1 %
Lymphocytes Relative: 8 %
Lymphs Abs: 0.6 10*3/uL — ABNORMAL LOW (ref 0.7–4.0)
MCH: 27.6 pg (ref 26.0–34.0)
MCHC: 31.6 g/dL (ref 30.0–36.0)
MCV: 87.4 fL (ref 80.0–100.0)
Monocytes Absolute: 0.5 10*3/uL (ref 0.1–1.0)
Monocytes Relative: 7 %
Neutro Abs: 5.6 10*3/uL (ref 1.7–7.7)
Neutrophils Relative %: 82 %
Platelets: 167 10*3/uL (ref 150–400)
RBC: 2.61 MIL/uL — ABNORMAL LOW (ref 3.87–5.11)
RDW: 14.5 % (ref 11.5–15.5)
WBC: 6.9 10*3/uL (ref 4.0–10.5)
nRBC: 0 % (ref 0.0–0.2)

## 2020-01-30 LAB — BASIC METABOLIC PANEL
Anion gap: 10 (ref 5–15)
BUN: 12 mg/dL (ref 8–23)
CO2: 20 mmol/L — ABNORMAL LOW (ref 22–32)
Calcium: 8.3 mg/dL — ABNORMAL LOW (ref 8.9–10.3)
Chloride: 108 mmol/L (ref 98–111)
Creatinine, Ser: 0.8 mg/dL (ref 0.44–1.00)
GFR calc Af Amer: >60 mL/min (ref >60)
GFR calc non Af Amer: >60 mL/min (ref >60)
Glucose, Bld: 122 mg/dL — ABNORMAL HIGH (ref 70–99)
Potassium: 3.3 mmol/L — ABNORMAL LOW (ref 3.5–5.1)
Sodium: 138 mmol/L (ref 135–145)

## 2020-01-30 LAB — GLUCOSE, CAPILLARY
Glucose-Capillary: 111 mg/dL — ABNORMAL HIGH (ref 70–99)
Glucose-Capillary: 127 mg/dL — ABNORMAL HIGH (ref 70–99)

## 2020-01-30 MED ORDER — POTASSIUM CHLORIDE 10 MEQ/100ML IV SOLN
10.0000 meq | INTRAVENOUS | Status: AC
  Administered 2020-01-30 (×4): 10 meq via INTRAVENOUS
  Filled 2020-01-30 (×4): qty 100

## 2020-01-30 MED ORDER — LIDOCAINE HCL 1 % IJ SOLN
INTRAMUSCULAR | Status: AC
  Filled 2020-01-30: qty 20

## 2020-01-30 MED ORDER — GERHARDT'S BUTT CREAM
TOPICAL_CREAM | CUTANEOUS | Status: DC | PRN
  Filled 2020-01-30: qty 1

## 2020-01-30 MED ORDER — LIDOCAINE HCL (PF) 1 % IJ SOLN
INTRAMUSCULAR | Status: DC | PRN
  Administered 2020-01-30: 5 mL
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
## 2020-01-30 NOTE — Procedures (Signed)
PROCEDURE SUMMARY:  Successful US guided left thoracentesis. Yielded 650 ml of bloody fluid. Pt tolerated procedure well. No immediate complications.  CXR ordered, no pneumothorax  EBL < 5 mL  Theresa Duty, NP 01/30/2020 12:34 PM
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
## 2020-01-31 LAB — CBC WITH DIFFERENTIAL/PLATELET
Abs Immature Granulocytes: 0.06 10*3/uL (ref 0.00–0.07)
Basophils Absolute: 0 10*3/uL (ref 0.0–0.1)
Basophils Relative: 1 %
Eosinophils Absolute: 0.3 10*3/uL (ref 0.0–0.5)
Eosinophils Relative: 4 %
HCT: 22.6 % — ABNORMAL LOW (ref 36.0–46.0)
Hemoglobin: 7.1 g/dL — ABNORMAL LOW (ref 12.0–15.0)
Immature Granulocytes: 1 %
Lymphocytes Relative: 12 %
Lymphs Abs: 0.7 10*3/uL (ref 0.7–4.0)
MCH: 27.4 pg (ref 26.0–34.0)
MCHC: 31.4 g/dL (ref 30.0–36.0)
MCV: 87.3 fL (ref 80.0–100.0)
Monocytes Absolute: 0.4 10*3/uL (ref 0.1–1.0)
Monocytes Relative: 7 %
Neutro Abs: 4.9 10*3/uL (ref 1.7–7.7)
Neutrophils Relative %: 75 %
Platelets: 172 10*3/uL (ref 150–400)
RBC: 2.59 MIL/uL — ABNORMAL LOW (ref 3.87–5.11)
RDW: 14.4 % (ref 11.5–15.5)
WBC: 6.4 10*3/uL (ref 4.0–10.5)
nRBC: 0 % (ref 0.0–0.2)

## 2020-01-31 LAB — BASIC METABOLIC PANEL
Anion gap: 7 (ref 5–15)
BUN: 10 mg/dL (ref 8–23)
CO2: 20 mmol/L — ABNORMAL LOW (ref 22–32)
Calcium: 8 mg/dL — ABNORMAL LOW (ref 8.9–10.3)
Chloride: 107 mmol/L (ref 98–111)
Creatinine, Ser: 0.69 mg/dL (ref 0.44–1.00)
GFR calc Af Amer: >60 mL/min (ref >60)
GFR calc non Af Amer: >60 mL/min (ref >60)
Glucose, Bld: 109 mg/dL — ABNORMAL HIGH (ref 70–99)
Potassium: 3.6 mmol/L (ref 3.5–5.1)
Sodium: 134 mmol/L — ABNORMAL LOW (ref 135–145)

## 2020-01-31 LAB — PREPARE RBC (CROSSMATCH)

## 2020-01-31 LAB — URINE CULTURE: Culture: >=100000 — AB

## 2020-01-31 LAB — HEMOGLOBIN AND HEMATOCRIT, BLOOD
HCT: 27.6 % — ABNORMAL LOW (ref 36.0–46.0)
Hemoglobin: 8.6 g/dL — ABNORMAL LOW (ref 12.0–15.0)

## 2020-01-31 MED ORDER — SODIUM CHLORIDE 0.9% IV SOLUTION: Freq: Once | INTRAVENOUS | Status: DC

## 2020-01-31 MED ORDER — PANTOPRAZOLE SODIUM 40 MG PO TBEC
40.0000 mg | DELAYED_RELEASE_TABLET | Freq: Every day | ORAL | Status: DC
  Administered 2020-01-31 – 2020-02-03 (×4): 40 mg via ORAL
  Filled 2020-01-31 (×4): qty 1

## 2020-01-31 NOTE — TOC Progression Note (Signed)
Transition of Care [facility]) - Progression Note    Patient Details  Name: [name] MRN: [id number] Date of Birth: [birth year]  Transition of Care [facility]) CM/SW [place], [place] Phone Number: 01/31/2020, 5:15 PM  Clinical Narrative:     [facility] SNF  -declined   Thurmond Butts, MSW, [place] Clinical Social Worker   Expected Discharge Plan: Skilled Nursing Facility Barriers to Discharge: SNF Pending bed offer  Expected Discharge Plan and Services Expected Discharge Plan: [place]                                               Social Determinants of Health (SDOH) Interventions    Readmission Risk Interventions No flowsheet data found.
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
## 2020-01-31 NOTE — Progress Notes (Signed)
Central tele called to advise pt had brief episode of ventricular trigeminy.  Patient states it feels like she cannot breath or take in deep breath.  O2 Sats and vitals stable.  Patient coughing and stated her mouth was burning. Dr. Tawanna Solo called and advised of above.  He advised that I continue to monitor and he will order CXR.
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
## 2020-01-31 NOTE — Social Work (Signed)
CSW called patient's spouse [name], he requested to contact his daughter-in-law [name]. CSW called [name]- left voice message to return call.  Thurmond Butts, MSW, [place] Clinical Social Worker

## 2020-01-31 NOTE — TOC Initial Note (Signed)
Transition of Care [facility]) - Initial/Assessment Note    Patient Details  Name: [name] MRN: [id number] Date of Birth: 21-Aug-1927  [facility]) CM/SW Contact:    Vinie Sill, [place] Phone Number: 01/31/2020, 4:02 PM  Clinical Narrative:                  CSW spoke with patient's daughter-in -law,[name]. CSW introduced self and explained role.  CSW informed [name] of bed offers.  Her preferred SNF choice is [facility]). She states patient has  received covid vaccines.   CSW contacted [facility]- they are reviewing and will get back with CSW.  [place] - left voice message with Admission Coordinator/ Camillo Flaming, MSW, [place] Clinical Social Worker    Expected Discharge Plan: Skilled Nursing Facility Barriers to Discharge: SNF Pending bed offer   Patient Goals and CMS Choice        Expected Discharge Plan and Services Expected Discharge Plan: [place]                                              Prior Living Arrangements/Services   Lives with:: Self, Spouse Patient language and need for interpreter reviewed:: No        Need for Family Participation in Patient Care: Yes (Comment) Care giver support system in place?: Yes (comment)   Criminal Activity/Legal Involvement Pertinent to Current Situation/Hospitalization: No - Comment as needed  Activities of Daily Living      Permission Sought/Granted Permission sought to share information with : Family Supports Permission granted to share information with : Yes, Verbal Permission Granted  Share Information with NAME: Kemaria Dedic  Permission granted to share info w AGENCY: SNFs  Permission granted to share info w Relationship: spouse  Permission granted to share info w Contact Information: [phone]  Emotional Assessment       Orientation: : Oriented to Self, Oriented to Place Alcohol /  Substance Use: Not Applicable Psych Involvement: No (comment)  Admission diagnosis:  Hemothorax [J94.2] Encounter for chest tube placement [Z46.82] Hemothorax on left [J94.2] Patient Active Problem List   Diagnosis Date Noted  . Recurrent left pleural effusion   . Weakness   . Palliative care by specialist   . Goals of care, counseling/discussion   . DNR (do not resuscitate)   . Hemothorax 01/23/2020  . Multiple rib fractures 01/23/2020  . Lumbar transverse process fracture ([place]) 01/23/2020  . Hypertensive urgency 01/23/2020  . Anemia 01/23/2020  . Closed fracture of right proximal humerus 05/31/2019  . GIB (gastrointestinal bleeding) 02/03/2017  . CAP (community acquired pneumonia) 04/13/2016  . PNA (pneumonia) 04/13/2016  . Murmur 09/02/2015  . Parkinson's disease ([place]) 09/02/2015  . Bilateral carotid bruits 09/02/2015  . Confusion 08/07/2015  . Acute encephalopathy 08/07/2015  . UTI (lower urinary tract infection) 08/07/2015  . Acute kidney failure ([place]) 08/07/2015  . Paralysis agitans ([place]) 09/19/2012  . Lymphoma ([place]) 07/03/2011  . Malignant neoplasm of female breast ([place]) 07/03/2011  . Abnormal CT of the chest 03/13/2011  . HYPERTENSION, BENIGN 01/01/2009  . EDEMA 01/01/2009   PCP:  [name], MD Pharmacy:   [place], [place] - [address] [name] [address] Phone: [phone] Fax: [phone]  [id number] - [place], [place] -  [place] [address] [name] [address] Phone: [phone] Fax: [phone]     Social Determinants of Health (SDOH) Interventions    Readmission Risk Interventions No flowsheet data found.
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
## 2020-02-01 LAB — TYPE AND SCREEN
ABO/RH(D): O POS
Antibody Screen: NEGATIVE
Unit division: 0

## 2020-02-01 LAB — CBC WITH DIFFERENTIAL/PLATELET
Abs Immature Granulocytes: 0.09 10*3/uL — ABNORMAL HIGH (ref 0.00–0.07)
Basophils Absolute: 0 10*3/uL (ref 0.0–0.1)
Basophils Relative: 1 %
Eosinophils Absolute: 0.3 10*3/uL (ref 0.0–0.5)
Eosinophils Relative: 4 %
HCT: 27.5 % — ABNORMAL LOW (ref 36.0–46.0)
Hemoglobin: 8.6 g/dL — ABNORMAL LOW (ref 12.0–15.0)
Immature Granulocytes: 1 %
Lymphocytes Relative: 13 %
Lymphs Abs: 0.9 10*3/uL (ref 0.7–4.0)
MCH: 26.9 pg (ref 26.0–34.0)
MCHC: 31.3 g/dL (ref 30.0–36.0)
MCV: 85.9 fL (ref 80.0–100.0)
Monocytes Absolute: 0.6 10*3/uL (ref 0.1–1.0)
Monocytes Relative: 9 %
Neutro Abs: 4.7 10*3/uL (ref 1.7–7.7)
Neutrophils Relative %: 72 %
Platelets: 196 10*3/uL (ref 150–400)
RBC: 3.2 MIL/uL — ABNORMAL LOW (ref 3.87–5.11)
RDW: 15 % (ref 11.5–15.5)
WBC: 6.5 10*3/uL (ref 4.0–10.5)
nRBC: 0 % (ref 0.0–0.2)

## 2020-02-01 LAB — BPAM RBC
Blood Product Expiration Date: 202107312359
ISSUE DATE / TIME: 202107071533
Unit Type and Rh: 5100
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
## 2020-02-01 NOTE — TOC Progression Note (Signed)
Transition of Care [facility]) - Progression Note    Patient Details  Name: [name] MRN: [id number] Date of Birth: [birth year]  Transition of Care [facility]) CM/SW [place], [place] Phone Number: 02/01/2020, 10:04 AM  Clinical Narrative:     CSW left voice message with patient's daughter-in -law,[name]- [facility] and [facility] has declined- CSW requested return call.  Thurmond Butts, MSW, [place] Clinical Social Worker   Expected Discharge Plan: Skilled Nursing Facility Barriers to Discharge: SNF Pending bed offer  Expected Discharge Plan and Services Expected Discharge Plan: [place]                                               Social Determinants of Health (SDOH) Interventions    Readmission Risk Interventions No flowsheet data found.

## 2020-02-01 NOTE — TOC Progression Note (Addendum)
Transition of Care [facility]) - Progression Note    Patient Details  Name: [name] MRN: [id number] Date of Birth: July 05, 1928  Transition of Care [facility]) CM/SW [place], [place] Phone Number: 02/01/2020, 3:16 PM  Clinical Narrative:     Called patient's daughter in law, Lynelle Smoke, requested return call to confirm discharge plan and SNF choice.  Called patient's spouse, left voice message to return call.  Thurmond Butts, MSW, [place] Clinical Social Worker   Expected Discharge Plan: Skilled Nursing Facility Barriers to Discharge: SNF Pending bed offer  Expected Discharge Plan and Services Expected Discharge Plan: [place]                                               Social Determinants of Health (SDOH) Interventions    Readmission Risk Interventions No flowsheet data found.

## 2020-02-01 NOTE — TOC Progression Note (Signed)
Transition of Care [facility]) - Progression Note    Patient Details  Name: [name] MRN: [id number] Date of Birth: July 22, 1928  Transition of Care [facility]) CM/SW [place], [place] Phone Number: 02/01/2020, 4:37 PM  Clinical Narrative:     Received call back form [name], they are reviewing bed offers and will get back with CSW as soon as possible.  Thurmond Butts, MSW, [place] Clinical Social Worker   Expected Discharge Plan: Skilled Nursing Facility Barriers to Discharge: SNF Pending bed offer  Expected Discharge Plan and Services Expected Discharge Plan: [place]                                               Social Determinants of Health (SDOH) Interventions    Readmission Risk Interventions No flowsheet data found.

## 2020-02-02 LAB — SARS CORONAVIRUS 2 BY RT PCR (HOSPITAL ORDER, PERFORMED IN ~~LOC~~ HOSPITAL LAB): SARS Coronavirus 2: NEGATIVE

## 2020-02-02 LAB — GLUCOSE, CAPILLARY: Glucose-Capillary: 117 mg/dL — ABNORMAL HIGH (ref 70–99)

## 2020-02-02 MED ORDER — PANTOPRAZOLE SODIUM 40 MG PO TBEC: 40.0000 mg | DELAYED_RELEASE_TABLET | Freq: Every day | ORAL | Status: DC

## 2020-02-02 MED ORDER — TAMSULOSIN HCL 0.4 MG PO CAPS: 0.4000 mg | ORAL_CAPSULE | Freq: Every day | ORAL | Status: DC

## 2020-02-02 MED ORDER — QUETIAPINE FUMARATE 25 MG PO TABS: 25.0000 mg | ORAL_TABLET | Freq: Every day | ORAL | Status: DC

## 2020-02-02 MED ORDER — AMLODIPINE BESYLATE 10 MG PO TABS: 10.0000 mg | ORAL_TABLET | Freq: Every day | ORAL | Status: DC

## 2020-02-02 MED ORDER — METHOCARBAMOL 500 MG PO TABS: 500.0000 mg | ORAL_TABLET | Freq: Three times a day (TID) | ORAL | Status: DC | PRN

## 2020-02-02 MED ORDER — OXYCODONE HCL 5 MG PO TABS: 5.0000 mg | ORAL_TABLET | Freq: Four times a day (QID) | ORAL | 0 refills | Status: DC | PRN

## 2020-02-02 NOTE — TOC Progression Note (Signed)
Transition of Care [facility]) - Progression Note    Patient Details  Name: Necia Kamm MRN: [id number] Date of Birth: 1928/02/16  Transition of Care [facility]) CM/SW [place], [place] Phone Number: 02/02/2020, 12:47 PM  Clinical Narrative:     [facility] confirmed bed offer.  Covid test requested.  CSW will continue to follow and assist with discharged planning.  Thurmond Butts, MSW, [place] Clinical Social Worker   Expected Discharge Plan: Skilled Nursing Facility Barriers to Discharge: SNF Pending bed offer  Expected Discharge Plan and Services Expected Discharge Plan: [place]                                               Social Determinants of Health (SDOH) Interventions    Readmission Risk Interventions No flowsheet data found.
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
## 2020-02-02 NOTE — Discharge Summary (Signed)
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 01/31/2020 FINAL  Final   Organism ID, Bacteria ESCHERICHIA COLI (A)  Final      Susceptibility   Escherichia coli - MIC*    AMPICILLIN 8 SENSITIVE Sensitive     CEFAZOLIN <=4 SENSITIVE Sensitive     CEFTRIAXONE <=0.25 SENSITIVE Sensitive     CIPROFLOXACIN <=0.25 SENSITIVE Sensitive     GENTAMICIN <=1 SENSITIVE Sensitive     IMIPENEM <=0.25 SENSITIVE Sensitive     NITROFURANTOIN <=16 SENSITIVE Sensitive     TRIMETH/SULFA <=20 SENSITIVE Sensitive     AMPICILLIN/SULBACTAM 4 SENSITIVE Sensitive     PIP/TAZO <=4 SENSITIVE Sensitive     * >=100,000 COLONIES/mL ESCHERICHIA COLI     Labs: BNP (last 3 results) No results for input(s): BNP in the last 8760 hours. Basic Metabolic Panel: Recent Labs  Lab 01/27/20 0346 01/28/20 0923 01/28/20 1930 01/30/20 0651 01/31/20 0256  NA 144 143 142 138 134*  K 3.4* 3.7 3.5 3.3* 3.6  CL 115* 115* 114* 108 107  CO2 21* 19* 21* 20* 20*  GLUCOSE 103*  107* 147* 122* 109*  BUN 15 11 10 12 10   CREATININE 0.71 0.63 0.72 0.80 0.69  CALCIUM 8.2* 8.2* 8.3* 8.3* 8.0*   Liver Function Tests: No results for input(s): AST, ALT, ALKPHOS, BILITOT, PROT, ALBUMIN in the last 168 hours. No results for input(s): LIPASE, AMYLASE in the last 168 hours. No results for input(s): AMMONIA in the last 168 hours. CBC: Recent Labs  Lab 01/29/20 0835 01/30/20 0836 01/31/20 0256 01/31/20 1954 02/01/20 0426  WBC 6.9 6.9 6.4  --  6.5  NEUTROABS 5.7 5.6 4.9  --  4.7  HGB 7.3* 7.2* 7.1* 8.6* 8.6*  HCT 23.1* 22.8* 22.6* 27.6* 27.5*  MCV 88.8 87.4 87.3  --  85.9  PLT 165 167 172  --  196   Cardiac Enzymes: No results for input(s): CKTOTAL, CKMB, CKMBINDEX, TROPONINI in the last 168 hours. BNP: Invalid input(s): POCBNP CBG: Recent Labs  Lab 01/29/20 0722 01/30/20 1122 01/30/20 1556 02/02/20 1136  GLUCAP 113* 111* 127* 117*   D-Dimer No results for input(s): DDIMER in the last 72 hours. Hgb A1c No results  for input(s): HGBA1C in the last 72 hours. Lipid Profile No results for input(s): CHOL, HDL, LDLCALC, TRIG, CHOLHDL, LDLDIRECT in the last 72 hours. Thyroid function studies No results for input(s): TSH, T4TOTAL, T3FREE, THYROIDAB in the last 72 hours.  Invalid input(s): FREET3 Anemia work up No results for input(s): VITAMINB12, FOLATE, FERRITIN, TIBC, IRON, RETICCTPCT in the last 72 hours. Urinalysis    Component Value Date/Time   COLORURINE AMBER (A) 01/28/2020 1641   APPEARANCEUR HAZY (A) 01/28/2020 1641   LABSPEC 1.019 01/28/2020 1641   LABSPEC 1.015 06/05/2008 0907   PHURINE 5.0 01/28/2020 1641   GLUCOSEU 150 (A) 01/28/2020 1641   HGBUR MODERATE (A) 01/28/2020 1641   BILIRUBINUR NEGATIVE 01/28/2020 1641   BILIRUBINUR Negative 06/05/2008 0907   KETONESUR 5 (A) 01/28/2020 1641   PROTEINUR NEGATIVE 01/28/2020 1641   UROBILINOGEN 0.2 09/20/2012 0117   NITRITE POSITIVE (A) 01/28/2020 1641   LEUKOCYTESUR TRACE (A) 01/28/2020 1641    LEUKOCYTESUR Negative 06/05/2008 0907   Sepsis Labs Invalid input(s): PROCALCITONIN,  WBC,  LACTICIDVEN Microbiology Recent Results (from the past 240 hour(s))  MRSA PCR Screening     Status: None   Collection Time: 01/23/20 10:28 PM   Specimen: Nasopharyngeal  Result Value Ref Range Status   MRSA by PCR NEGATIVE NEGATIVE Final    Comment:        The GeneXpert MRSA Assay (FDA approved for NASAL specimens only), is one component of a comprehensive MRSA colonization surveillance program. It is not intended to diagnose MRSA infection nor to guide or monitor treatment for MRSA infections. Performed at [facility], [place] [address] [place], [place] [address]   SARS Coronavirus 2 by RT PCR (hospital order, performed in [facility] hospital lab) Nasopharyngeal Nasopharyngeal Swab     Status: None   Collection Time: 01/26/20  9:14 AM   Specimen: Nasopharyngeal Swab  Result Value Ref Range Status   SARS Coronavirus 2 NEGATIVE NEGATIVE Final    Comment: (NOTE) SARS-CoV-2 target nucleic acids are NOT DETECTED.  The SARS-CoV-2 RNA is generally detectable in upper and lower respiratory specimens during the acute phase of infection. The lowest concentration of SARS-CoV-2 viral copies this assay can detect is 250 copies / mL. A negative result does not preclude SARS-CoV-2 infection and should not be used as the sole basis for treatment or other patient management decisions.  A negative result may occur with improper specimen collection / handling, submission of specimen other than nasopharyngeal swab, presence of viral mutation(s) within the areas targeted by this assay, and inadequate number of viral copies (<250 copies / mL). A negative result must be combined with clinical observations, patient history, and epidemiological information.  Fact Sheet for Patients:   StrictlyIdeas.no  Fact Sheet for Healthcare  Providers: BankingDealers.co.za  This test is not yet approved or  cleared by the [place] FDA and has been authorized for detection and/or diagnosis of SARS-CoV-2 by FDA under an Emergency Use Authorization (EUA).  This EUA will remain in effect (meaning this test can be used) for the duration of the COVID-19 declaration under Section 564(b)(1) of the Act, 21 U.S.C. section 360bbb-3(b)(1), unless the authorization is terminated or revoked sooner.  Performed at [facility], [place] [address] [place], [place] [address]   Culture, Urine     Status: Abnormal   Collection Time: 01/28/20  9:41 PM   Specimen: Urine, Catheterized  Result Value Ref Range Status   Specimen Description URINE, CATHETERIZED  Final  Special Requests   Final    NONE Performed at [facility], [place] [address] [place], [place] [address]    Culture >=100,000 COLONIES/mL ESCHERICHIA COLI (A)  Final   Report Status 01/31/2020 FINAL  Final   Organism ID, Bacteria ESCHERICHIA COLI (A)  Final      Susceptibility   Escherichia coli - MIC*    AMPICILLIN 8 SENSITIVE Sensitive     CEFAZOLIN <=4 SENSITIVE Sensitive     CEFTRIAXONE <=0.25 SENSITIVE Sensitive     CIPROFLOXACIN <=0.25 SENSITIVE Sensitive     GENTAMICIN <=1 SENSITIVE Sensitive     IMIPENEM <=0.25 SENSITIVE Sensitive     NITROFURANTOIN <=16 SENSITIVE Sensitive     TRIMETH/SULFA <=20 SENSITIVE Sensitive     AMPICILLIN/SULBACTAM 4 SENSITIVE Sensitive     PIP/TAZO <=4 SENSITIVE Sensitive     * >=100,000 COLONIES/mL ESCHERICHIA COLI    Please note: You were cared for by a hospitalist during your hospital stay. Once you are discharged, your primary care physician will handle any further medical issues. Please note that NO REFILLS for any discharge medications will be authorized once you are discharged, as it is imperative that you return to your primary care physician (or establish a relationship with a primary care  physician if you do not have one) for your post hospital discharge needs so that they can reassess your need for medications and monitor your lab values.    Time coordinating discharge: 40 minutes  SIGNED:   Shelly Coss, MD  [facility] 02/02/2020, 2:39 PM Pager [phone]  If 7PM-7AM, please contact night-coverage [email or web address] Password [email or web address]

## 2020-02-02 NOTE — TOC Progression Note (Addendum)
Transition of Care [facility]) - Progression Note    Patient Details  Name: Kavya Haag MRN: [id number] Date of Birth: 1928-03-07  Transition of Care [facility]) CM/SW [place], [place] Phone Number: 02/02/2020, 11:49 AM  Clinical Narrative:     CSW received message from patient's daughter in law,[name], the family wants [facility].   CSW contacted [facility], they are reviewing and will call CSW back.  Called and updated patient's spouse and daughter in law - offer is pending  Thurmond Butts, MSW, [place] Social Worker    Expected Discharge Plan: Skilled Nursing Facility Barriers to Discharge: SNF Pending bed offer  Expected Discharge Plan and Services Expected Discharge Plan: [place]                                               Social Determinants of Health (SDOH) Interventions    Readmission Risk Interventions No flowsheet data found.

## 2020-02-03 ENCOUNTER — Inpatient Hospital Stay (HOSPITAL_COMMUNITY): Payer: No Typology Code available for payment source

## 2020-02-03 DIAGNOSIS — J9811 Atelectasis: Secondary | ICD-10-CM | POA: Diagnosis not present

## 2020-02-03 DIAGNOSIS — F329 Major depressive disorder, single episode, unspecified: Secondary | ICD-10-CM | POA: Diagnosis present

## 2020-02-03 DIAGNOSIS — N1832 Chronic kidney disease, stage 3b: Secondary | ICD-10-CM | POA: Diagnosis present

## 2020-02-03 DIAGNOSIS — N3090 Cystitis, unspecified without hematuria: Secondary | ICD-10-CM | POA: Diagnosis not present

## 2020-02-03 DIAGNOSIS — J45909 Unspecified asthma, uncomplicated: Secondary | ICD-10-CM | POA: Diagnosis present

## 2020-02-03 DIAGNOSIS — J90 Pleural effusion, not elsewhere classified: Secondary | ICD-10-CM | POA: Diagnosis not present

## 2020-02-03 DIAGNOSIS — Z9889 Other specified postprocedural states: Secondary | ICD-10-CM | POA: Diagnosis not present

## 2020-02-03 DIAGNOSIS — Z8572 Personal history of non-Hodgkin lymphomas: Secondary | ICD-10-CM | POA: Diagnosis not present

## 2020-02-03 DIAGNOSIS — D72819 Decreased white blood cell count, unspecified: Secondary | ICD-10-CM | POA: Diagnosis present

## 2020-02-03 DIAGNOSIS — R410 Disorientation, unspecified: Secondary | ICD-10-CM | POA: Diagnosis not present

## 2020-02-03 DIAGNOSIS — R0902 Hypoxemia: Secondary | ICD-10-CM | POA: Diagnosis not present

## 2020-02-03 DIAGNOSIS — I10 Essential (primary) hypertension: Secondary | ICD-10-CM | POA: Diagnosis not present

## 2020-02-03 DIAGNOSIS — R278 Other lack of coordination: Secondary | ICD-10-CM | POA: Diagnosis not present

## 2020-02-03 DIAGNOSIS — J9601 Acute respiratory failure with hypoxia: Secondary | ICD-10-CM | POA: Diagnosis not present

## 2020-02-03 DIAGNOSIS — K219 Gastro-esophageal reflux disease without esophagitis: Secondary | ICD-10-CM | POA: Diagnosis not present

## 2020-02-03 DIAGNOSIS — Z79899 Other long term (current) drug therapy: Secondary | ICD-10-CM | POA: Diagnosis not present

## 2020-02-03 DIAGNOSIS — R1310 Dysphagia, unspecified: Secondary | ICD-10-CM | POA: Diagnosis present

## 2020-02-03 DIAGNOSIS — N390 Urinary tract infection, site not specified: Secondary | ICD-10-CM | POA: Diagnosis not present

## 2020-02-03 DIAGNOSIS — Z741 Need for assistance with personal care: Secondary | ICD-10-CM | POA: Diagnosis not present

## 2020-02-03 DIAGNOSIS — I129 Hypertensive chronic kidney disease with stage 1 through stage 4 chronic kidney disease, or unspecified chronic kidney disease: Secondary | ICD-10-CM | POA: Diagnosis present

## 2020-02-03 DIAGNOSIS — F3341 Major depressive disorder, recurrent, in partial remission: Secondary | ICD-10-CM | POA: Diagnosis not present

## 2020-02-03 DIAGNOSIS — I160 Hypertensive urgency: Secondary | ICD-10-CM | POA: Diagnosis not present

## 2020-02-03 DIAGNOSIS — S32000D Wedge compression fracture of unspecified lumbar vertebra, subsequent encounter for fracture with routine healing: Secondary | ICD-10-CM | POA: Diagnosis not present

## 2020-02-03 DIAGNOSIS — R41841 Cognitive communication deficit: Secondary | ICD-10-CM | POA: Diagnosis not present

## 2020-02-03 DIAGNOSIS — R262 Difficulty in walking, not elsewhere classified: Secondary | ICD-10-CM | POA: Diagnosis not present

## 2020-02-03 DIAGNOSIS — D631 Anemia in chronic kidney disease: Secondary | ICD-10-CM | POA: Diagnosis present

## 2020-02-03 DIAGNOSIS — S32009D Unspecified fracture of unspecified lumbar vertebra, subsequent encounter for fracture with routine healing: Secondary | ICD-10-CM | POA: Diagnosis not present

## 2020-02-03 DIAGNOSIS — G20 Parkinson's disease: Secondary | ICD-10-CM | POA: Diagnosis not present

## 2020-02-03 DIAGNOSIS — S270XXD Traumatic pneumothorax, subsequent encounter: Secondary | ICD-10-CM | POA: Diagnosis not present

## 2020-02-03 DIAGNOSIS — Z66 Do not resuscitate: Secondary | ICD-10-CM | POA: Diagnosis present

## 2020-02-03 DIAGNOSIS — C8590 Non-Hodgkin lymphoma, unspecified, unspecified site: Secondary | ICD-10-CM | POA: Diagnosis not present

## 2020-02-03 DIAGNOSIS — Z20822 Contact with and (suspected) exposure to covid-19: Secondary | ICD-10-CM | POA: Diagnosis present

## 2020-02-03 DIAGNOSIS — R001 Bradycardia, unspecified: Secondary | ICD-10-CM | POA: Diagnosis not present

## 2020-02-03 DIAGNOSIS — F0280 Dementia in other diseases classified elsewhere without behavioral disturbance: Secondary | ICD-10-CM | POA: Diagnosis present

## 2020-02-03 DIAGNOSIS — F339 Major depressive disorder, recurrent, unspecified: Secondary | ICD-10-CM | POA: Diagnosis not present

## 2020-02-03 DIAGNOSIS — R339 Retention of urine, unspecified: Secondary | ICD-10-CM | POA: Diagnosis not present

## 2020-02-03 DIAGNOSIS — R5381 Other malaise: Secondary | ICD-10-CM | POA: Diagnosis not present

## 2020-02-03 DIAGNOSIS — B9620 Unspecified Escherichia coli [E. coli] as the cause of diseases classified elsewhere: Secondary | ICD-10-CM | POA: Diagnosis present

## 2020-02-03 DIAGNOSIS — I491 Atrial premature depolarization: Secondary | ICD-10-CM | POA: Diagnosis not present

## 2020-02-03 DIAGNOSIS — F0281 Dementia in other diseases classified elsewhere with behavioral disturbance: Secondary | ICD-10-CM | POA: Diagnosis not present

## 2020-02-03 DIAGNOSIS — S32019D Unspecified fracture of first lumbar vertebra, subsequent encounter for fracture with routine healing: Secondary | ICD-10-CM | POA: Diagnosis not present

## 2020-02-03 DIAGNOSIS — S2242XD Multiple fractures of ribs, left side, subsequent encounter for fracture with routine healing: Secondary | ICD-10-CM | POA: Diagnosis not present

## 2020-02-03 DIAGNOSIS — R4182 Altered mental status, unspecified: Secondary | ICD-10-CM | POA: Diagnosis not present

## 2020-02-03 DIAGNOSIS — M6281 Muscle weakness (generalized): Secondary | ICD-10-CM | POA: Diagnosis not present

## 2020-02-03 DIAGNOSIS — R9082 White matter disease, unspecified: Secondary | ICD-10-CM | POA: Diagnosis not present

## 2020-02-03 DIAGNOSIS — R0602 Shortness of breath: Secondary | ICD-10-CM | POA: Diagnosis not present

## 2020-02-03 DIAGNOSIS — R1312 Dysphagia, oropharyngeal phase: Secondary | ICD-10-CM | POA: Diagnosis not present

## 2020-02-03 DIAGNOSIS — M2550 Pain in unspecified joint: Secondary | ICD-10-CM | POA: Diagnosis not present

## 2020-02-03 DIAGNOSIS — E876 Hypokalemia: Secondary | ICD-10-CM | POA: Diagnosis present

## 2020-02-03 DIAGNOSIS — R069 Unspecified abnormalities of breathing: Secondary | ICD-10-CM | POA: Diagnosis not present

## 2020-02-03 DIAGNOSIS — J439 Emphysema, unspecified: Secondary | ICD-10-CM | POA: Diagnosis not present

## 2020-02-03 DIAGNOSIS — F419 Anxiety disorder, unspecified: Secondary | ICD-10-CM | POA: Diagnosis present

## 2020-02-03 DIAGNOSIS — J942 Hemothorax: Secondary | ICD-10-CM | POA: Diagnosis not present

## 2020-02-03 DIAGNOSIS — Z7401 Bed confinement status: Secondary | ICD-10-CM | POA: Diagnosis not present

## 2020-02-03 DIAGNOSIS — E1122 Type 2 diabetes mellitus with diabetic chronic kidney disease: Secondary | ICD-10-CM | POA: Diagnosis present

## 2020-02-03 DIAGNOSIS — R52 Pain, unspecified: Secondary | ICD-10-CM | POA: Diagnosis not present

## 2020-02-03 DIAGNOSIS — D649 Anemia, unspecified: Secondary | ICD-10-CM | POA: Diagnosis not present

## 2020-02-03 DIAGNOSIS — G9341 Metabolic encephalopathy: Secondary | ICD-10-CM | POA: Diagnosis not present

## 2020-02-03 DIAGNOSIS — R531 Weakness: Secondary | ICD-10-CM | POA: Diagnosis not present

## 2020-02-03 LAB — BASIC METABOLIC PANEL
Anion gap: 7 (ref 5–15)
BUN: 9 mg/dL (ref 8–23)
CO2: 25 mmol/L (ref 22–32)
Calcium: 8.3 mg/dL — ABNORMAL LOW (ref 8.9–10.3)
Chloride: 107 mmol/L (ref 98–111)
Creatinine, Ser: 0.61 mg/dL (ref 0.44–1.00)
GFR calc Af Amer: >60 mL/min (ref >60)
GFR calc non Af Amer: >60 mL/min (ref >60)
Glucose, Bld: 108 mg/dL — ABNORMAL HIGH (ref 70–99)
Potassium: 3.9 mmol/L (ref 3.5–5.1)
Sodium: 139 mmol/L (ref 135–145)

## 2020-02-03 LAB — CBC WITH DIFFERENTIAL/PLATELET
Abs Immature Granulocytes: 0.1 10*3/uL — ABNORMAL HIGH (ref 0.00–0.07)
Basophils Absolute: 0 10*3/uL (ref 0.0–0.1)
Basophils Relative: 1 %
Eosinophils Absolute: 0.2 10*3/uL (ref 0.0–0.5)
Eosinophils Relative: 3 %
HCT: 29.1 % — ABNORMAL LOW (ref 36.0–46.0)
Hemoglobin: 9.1 g/dL — ABNORMAL LOW (ref 12.0–15.0)
Immature Granulocytes: 2 %
Lymphocytes Relative: 9 %
Lymphs Abs: 0.5 10*3/uL — ABNORMAL LOW (ref 0.7–4.0)
MCH: 27 pg (ref 26.0–34.0)
MCHC: 31.3 g/dL (ref 30.0–36.0)
MCV: 86.4 fL (ref 80.0–100.0)
Monocytes Absolute: 0.4 10*3/uL (ref 0.1–1.0)
Monocytes Relative: 7 %
Neutro Abs: 4.6 10*3/uL (ref 1.7–7.7)
Neutrophils Relative %: 78 %
Platelets: 217 10*3/uL (ref 150–400)
RBC: 3.37 MIL/uL — ABNORMAL LOW (ref 3.87–5.11)
RDW: 14.8 % (ref 11.5–15.5)
WBC: 5.9 10*3/uL (ref 4.0–10.5)
nRBC: 0 % (ref 0.0–0.2)

## 2020-02-03 NOTE — TOC Transition Note (Signed)
Transition of Care [facility]) - CM/SW Discharge Note   Patient Details  Name: [name] MRN: [id number] Date of Birth: 03/19/1928  Transition of Care [facility]) CM/SW Contact:  Oretha Milch, LCSW Phone Number: 02/03/2020, 11:11 AM   Clinical Narrative: Patient will discharge to: [facility] Discharge date: 02/03/20 Transport by: Corey Harold  Per MD patient is appropriate for discharge and will discharge to [facility] in room 107. RN, patient, patient's family, and facility have been notified of discharge. Assessment, FL-2, PASRR, and discharge summary sent to facillity. RN was provided with the following number for report: [phone]. PTAR will be used to transfer patient to the facility. CSW will continue to follow for any discharge supports.  Daphine Deutscher, LCSW, [place] Social Worker II Emergency Department, [employer], [facility] [phone]       Final next level of care: Skilled Nursing Facility Barriers to Discharge: Barriers Resolved   Patient Goals and CMS Choice        Discharge Placement                       Discharge Plan and Services                                     Social Determinants of Health (SDOH) Interventions     Readmission Risk Interventions No flowsheet data found.
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
## 2020-02-03 NOTE — Progress Notes (Signed)
   02/03/20 1241  Hand-Off documentation  Handoff Given Given to Transfer Unit/facility [facility])  Report given to (Full Name) Adriana Simas  Handoff Received Received from shift RN/LPN  Report received from (Full Name) Ewing Schlein   Received discharge order for patient. Discharge instructions placed in packet and given to transport personnel. Pt stable in no acute distress. Sacral/buttocks area cleansed and incontinent pad changed. Pt off unit in stretcher. Husband [name] notified. Nursing care complete.

## 2020-02-05 DIAGNOSIS — G20 Parkinson's disease: Secondary | ICD-10-CM | POA: Diagnosis not present

## 2020-02-05 DIAGNOSIS — K219 Gastro-esophageal reflux disease without esophagitis: Secondary | ICD-10-CM | POA: Diagnosis not present

## 2020-02-05 DIAGNOSIS — R339 Retention of urine, unspecified: Secondary | ICD-10-CM | POA: Diagnosis not present

## 2020-02-05 DIAGNOSIS — S32009D Unspecified fracture of unspecified lumbar vertebra, subsequent encounter for fracture with routine healing: Secondary | ICD-10-CM | POA: Diagnosis not present

## 2020-02-12 DIAGNOSIS — S32009D Unspecified fracture of unspecified lumbar vertebra, subsequent encounter for fracture with routine healing: Secondary | ICD-10-CM | POA: Diagnosis not present

## 2020-02-12 DIAGNOSIS — S2242XD Multiple fractures of ribs, left side, subsequent encounter for fracture with routine healing: Secondary | ICD-10-CM | POA: Diagnosis not present

## 2020-02-12 DIAGNOSIS — K219 Gastro-esophageal reflux disease without esophagitis: Secondary | ICD-10-CM | POA: Diagnosis not present

## 2020-02-12 DIAGNOSIS — G20 Parkinson's disease: Secondary | ICD-10-CM | POA: Diagnosis not present

## 2020-02-18 ENCOUNTER — Encounter (HOSPITAL_COMMUNITY): Payer: Self-pay

## 2020-02-18 ENCOUNTER — Other Ambulatory Visit: Payer: Self-pay

## 2020-02-18 ENCOUNTER — Emergency Department (HOSPITAL_COMMUNITY): Payer: Medicare Other

## 2020-02-18 ENCOUNTER — Inpatient Hospital Stay (HOSPITAL_COMMUNITY)
Admission: EM | Admit: 2020-02-18 | Discharge: 2020-02-20 | DRG: 689 | Disposition: A | Discharge status: Skilled Nursing Facility | Payer: Medicare Other | Attending: Internal Medicine | Admitting: Internal Medicine

## 2020-02-18 DIAGNOSIS — G9341 Metabolic encephalopathy: Secondary | ICD-10-CM | POA: Diagnosis present

## 2020-02-18 DIAGNOSIS — B9620 Unspecified Escherichia coli [E. coli] as the cause of diseases classified elsewhere: Secondary | ICD-10-CM | POA: Diagnosis present

## 2020-02-18 DIAGNOSIS — R4182 Altered mental status, unspecified: Secondary | ICD-10-CM | POA: Diagnosis not present

## 2020-02-18 DIAGNOSIS — Z66 Do not resuscitate: Secondary | ICD-10-CM | POA: Diagnosis present

## 2020-02-18 DIAGNOSIS — R1310 Dysphagia, unspecified: Secondary | ICD-10-CM | POA: Diagnosis present

## 2020-02-18 DIAGNOSIS — Z79899 Other long term (current) drug therapy: Secondary | ICD-10-CM | POA: Diagnosis not present

## 2020-02-18 DIAGNOSIS — E1122 Type 2 diabetes mellitus with diabetic chronic kidney disease: Secondary | ICD-10-CM | POA: Diagnosis present

## 2020-02-18 DIAGNOSIS — R410 Disorientation, unspecified: Secondary | ICD-10-CM | POA: Diagnosis not present

## 2020-02-18 DIAGNOSIS — Z7401 Bed confinement status: Secondary | ICD-10-CM | POA: Diagnosis not present

## 2020-02-18 DIAGNOSIS — F419 Anxiety disorder, unspecified: Secondary | ICD-10-CM | POA: Diagnosis present

## 2020-02-18 DIAGNOSIS — C8590 Non-Hodgkin lymphoma, unspecified, unspecified site: Secondary | ICD-10-CM | POA: Diagnosis not present

## 2020-02-18 DIAGNOSIS — I10 Essential (primary) hypertension: Secondary | ICD-10-CM | POA: Diagnosis present

## 2020-02-18 DIAGNOSIS — D72819 Decreased white blood cell count, unspecified: Secondary | ICD-10-CM | POA: Diagnosis present

## 2020-02-18 DIAGNOSIS — G20A1 Parkinson's disease without dyskinesia, without mention of fluctuations: Secondary | ICD-10-CM | POA: Diagnosis present

## 2020-02-18 DIAGNOSIS — S2242XD Multiple fractures of ribs, left side, subsequent encounter for fracture with routine healing: Secondary | ICD-10-CM | POA: Diagnosis not present

## 2020-02-18 DIAGNOSIS — Z853 Personal history of malignant neoplasm of breast: Secondary | ICD-10-CM

## 2020-02-18 DIAGNOSIS — R9082 White matter disease, unspecified: Secondary | ICD-10-CM | POA: Diagnosis not present

## 2020-02-18 DIAGNOSIS — R001 Bradycardia, unspecified: Secondary | ICD-10-CM | POA: Diagnosis not present

## 2020-02-18 DIAGNOSIS — S270XXD Traumatic pneumothorax, subsequent encounter: Secondary | ICD-10-CM

## 2020-02-18 DIAGNOSIS — G20 Parkinson's disease: Secondary | ICD-10-CM | POA: Diagnosis present

## 2020-02-18 DIAGNOSIS — D631 Anemia in chronic kidney disease: Secondary | ICD-10-CM | POA: Diagnosis present

## 2020-02-18 DIAGNOSIS — F0280 Dementia in other diseases classified elsewhere without behavioral disturbance: Secondary | ICD-10-CM | POA: Diagnosis present

## 2020-02-18 DIAGNOSIS — Z801 Family history of malignant neoplasm of trachea, bronchus and lung: Secondary | ICD-10-CM

## 2020-02-18 DIAGNOSIS — R0602 Shortness of breath: Secondary | ICD-10-CM | POA: Diagnosis not present

## 2020-02-18 DIAGNOSIS — Z881 Allergy status to other antibiotic agents status: Secondary | ICD-10-CM

## 2020-02-18 DIAGNOSIS — Z885 Allergy status to narcotic agent status: Secondary | ICD-10-CM

## 2020-02-18 DIAGNOSIS — I491 Atrial premature depolarization: Secondary | ICD-10-CM | POA: Diagnosis not present

## 2020-02-18 DIAGNOSIS — I129 Hypertensive chronic kidney disease with stage 1 through stage 4 chronic kidney disease, or unspecified chronic kidney disease: Secondary | ICD-10-CM | POA: Diagnosis present

## 2020-02-18 DIAGNOSIS — N390 Urinary tract infection, site not specified: Secondary | ICD-10-CM | POA: Diagnosis not present

## 2020-02-18 DIAGNOSIS — R0902 Hypoxemia: Secondary | ICD-10-CM | POA: Diagnosis not present

## 2020-02-18 DIAGNOSIS — F329 Major depressive disorder, single episode, unspecified: Secondary | ICD-10-CM | POA: Diagnosis present

## 2020-02-18 DIAGNOSIS — N1832 Chronic kidney disease, stage 3b: Secondary | ICD-10-CM | POA: Diagnosis present

## 2020-02-18 DIAGNOSIS — K219 Gastro-esophageal reflux disease without esophagitis: Secondary | ICD-10-CM | POA: Diagnosis present

## 2020-02-18 DIAGNOSIS — S32019D Unspecified fracture of first lumbar vertebra, subsequent encounter for fracture with routine healing: Secondary | ICD-10-CM | POA: Diagnosis not present

## 2020-02-18 DIAGNOSIS — J90 Pleural effusion, not elsewhere classified: Secondary | ICD-10-CM | POA: Diagnosis not present

## 2020-02-18 DIAGNOSIS — R069 Unspecified abnormalities of breathing: Secondary | ICD-10-CM | POA: Diagnosis not present

## 2020-02-18 DIAGNOSIS — J45909 Unspecified asthma, uncomplicated: Secondary | ICD-10-CM | POA: Diagnosis present

## 2020-02-18 DIAGNOSIS — Z20822 Contact with and (suspected) exposure to covid-19: Secondary | ICD-10-CM | POA: Diagnosis present

## 2020-02-18 DIAGNOSIS — E876 Hypokalemia: Secondary | ICD-10-CM | POA: Diagnosis present

## 2020-02-18 DIAGNOSIS — Z8572 Personal history of non-Hodgkin lymphomas: Secondary | ICD-10-CM | POA: Diagnosis not present

## 2020-02-18 DIAGNOSIS — R5381 Other malaise: Secondary | ICD-10-CM | POA: Diagnosis not present

## 2020-02-18 DIAGNOSIS — R531 Weakness: Secondary | ICD-10-CM | POA: Diagnosis not present

## 2020-02-18 DIAGNOSIS — S271XXD Traumatic hemothorax, subsequent encounter: Secondary | ICD-10-CM

## 2020-02-18 DIAGNOSIS — F32A Depression, unspecified: Secondary | ICD-10-CM | POA: Diagnosis present

## 2020-02-18 DIAGNOSIS — J9811 Atelectasis: Secondary | ICD-10-CM | POA: Diagnosis not present

## 2020-02-18 DIAGNOSIS — S32029D Unspecified fracture of second lumbar vertebra, subsequent encounter for fracture with routine healing: Secondary | ICD-10-CM

## 2020-02-18 DIAGNOSIS — J439 Emphysema, unspecified: Secondary | ICD-10-CM | POA: Diagnosis not present

## 2020-02-18 DIAGNOSIS — M2550 Pain in unspecified joint: Secondary | ICD-10-CM | POA: Diagnosis not present

## 2020-02-18 LAB — CBC WITH DIFFERENTIAL/PLATELET
Abs Immature Granulocytes: 0.06 10*3/uL (ref 0.00–0.07)
Basophils Absolute: 0.1 10*3/uL (ref 0.0–0.1)
Basophils Relative: 1 %
Eosinophils Absolute: 0.1 10*3/uL (ref 0.0–0.5)
Eosinophils Relative: 2 %
HCT: 33.7 % — ABNORMAL LOW (ref 36.0–46.0)
Hemoglobin: 10.2 g/dL — ABNORMAL LOW (ref 12.0–15.0)
Immature Granulocytes: 2 %
Lymphocytes Relative: 24 %
Lymphs Abs: 0.9 10*3/uL (ref 0.7–4.0)
MCH: 26.2 pg (ref 26.0–34.0)
MCHC: 30.3 g/dL (ref 30.0–36.0)
MCV: 86.6 fL (ref 80.0–100.0)
Monocytes Absolute: 0.3 10*3/uL (ref 0.1–1.0)
Monocytes Relative: 7 %
Neutro Abs: 2.4 10*3/uL (ref 1.7–7.7)
Neutrophils Relative %: 64 %
Platelets: 208 10*3/uL (ref 150–400)
RBC: 3.89 MIL/uL (ref 3.87–5.11)
RDW: 14.1 % (ref 11.5–15.5)
WBC: 3.7 10*3/uL — ABNORMAL LOW (ref 4.0–10.5)
nRBC: 0 % (ref 0.0–0.2)

## 2020-02-18 LAB — URINALYSIS, ROUTINE W REFLEX MICROSCOPIC
Bilirubin Urine: NEGATIVE
Glucose, UA: NEGATIVE mg/dL
Hgb urine dipstick: NEGATIVE
Ketones, ur: NEGATIVE mg/dL
Nitrite: POSITIVE — AB
Protein, ur: NEGATIVE mg/dL
Specific Gravity, Urine: 1.013 (ref 1.005–1.030)
pH: 6 (ref 5.0–8.0)

## 2020-02-18 LAB — COMPREHENSIVE METABOLIC PANEL
ALT: <5 U/L (ref 0–44)
AST: 20 U/L (ref 15–41)
Albumin: 2.7 g/dL — ABNORMAL LOW (ref 3.5–5.0)
Alkaline Phosphatase: 121 U/L (ref 38–126)
Anion gap: 8 (ref 5–15)
BUN: 11 mg/dL (ref 8–23)
CO2: 27 mmol/L (ref 22–32)
Calcium: 8.7 mg/dL — ABNORMAL LOW (ref 8.9–10.3)
Chloride: 108 mmol/L (ref 98–111)
Creatinine, Ser: 0.76 mg/dL (ref 0.44–1.00)
GFR calc Af Amer: >60 mL/min (ref >60)
GFR calc non Af Amer: >60 mL/min (ref >60)
Glucose, Bld: 104 mg/dL — ABNORMAL HIGH (ref 70–99)
Potassium: 3.6 mmol/L (ref 3.5–5.1)
Sodium: 143 mmol/L (ref 135–145)
Total Bilirubin: 0.7 mg/dL (ref 0.3–1.2)
Total Protein: 5.3 g/dL — ABNORMAL LOW (ref 6.5–8.1)

## 2020-02-18 LAB — CREATININE, SERUM
Creatinine, Ser: 0.68 mg/dL (ref 0.44–1.00)
GFR calc Af Amer: >60 mL/min (ref >60)
GFR calc non Af Amer: >60 mL/min (ref >60)

## 2020-02-18 LAB — LACTIC ACID, PLASMA
Lactic Acid, Venous: 1.4 mmol/L (ref 0.5–1.9)
Lactic Acid, Venous: 1.4 mmol/L (ref 0.5–1.9)

## 2020-02-18 LAB — TROPONIN I (HIGH SENSITIVITY)
Troponin I (High Sensitivity): 17 ng/L (ref <18)
Troponin I (High Sensitivity): 16 ng/L (ref <18)

## 2020-02-18 LAB — PHOSPHORUS: Phosphorus: 3.3 mg/dL (ref 2.5–4.6)

## 2020-02-18 LAB — MAGNESIUM: Magnesium: 1.7 mg/dL (ref 1.7–2.4)

## 2020-02-18 LAB — TSH: TSH: 4.271 u[IU]/mL (ref 0.350–4.500)

## 2020-02-18 LAB — SARS CORONAVIRUS 2 BY RT PCR (HOSPITAL ORDER, PERFORMED IN ~~LOC~~ HOSPITAL LAB): SARS Coronavirus 2: NEGATIVE

## 2020-02-18 MED ORDER — SODIUM CHLORIDE 0.9 % IV SOLN
1.0000 g | INTRAVENOUS | Status: DC
  Filled 2020-02-18 (×2): qty 10

## 2020-02-18 MED ORDER — ACETAMINOPHEN 650 MG RE SUPP: 650.0000 mg | Freq: Four times a day (QID) | RECTAL | Status: DC | PRN

## 2020-02-18 MED ORDER — SODIUM CHLORIDE 0.9 % IV SOLN: INTRAVENOUS | Status: DC

## 2020-02-18 MED ORDER — ONDANSETRON HCL 4 MG PO TABS: 4.0000 mg | ORAL_TABLET | Freq: Four times a day (QID) | ORAL | Status: DC | PRN

## 2020-02-18 MED ORDER — SODIUM CHLORIDE 0.9 % IV SOLN
1.0000 g | Freq: Once | INTRAVENOUS | Status: AC
  Administered 2020-02-18: 1 g via INTRAVENOUS
  Filled 2020-02-18: qty 10

## 2020-02-18 MED ORDER — BLISTEX MEDICATED EX OINT
TOPICAL_OINTMENT | CUTANEOUS | Status: DC | PRN
  Filled 2020-02-18: qty 6.3

## 2020-02-18 MED ORDER — HYDRALAZINE HCL 20 MG/ML IJ SOLN
10.0000 mg | Freq: Four times a day (QID) | INTRAMUSCULAR | Status: DC | PRN
  Administered 2020-02-19 – 2020-02-20 (×2): 10 mg via INTRAVENOUS
  Filled 2020-02-18 (×2): qty 1

## 2020-02-18 MED ORDER — ONDANSETRON HCL 4 MG/2ML IJ SOLN: 4.0000 mg | Freq: Four times a day (QID) | INTRAMUSCULAR | Status: DC | PRN

## 2020-02-18 MED ORDER — ACETAMINOPHEN 325 MG PO TABS: 650.0000 mg | ORAL_TABLET | Freq: Four times a day (QID) | ORAL | Status: DC | PRN

## 2020-02-18 MED ORDER — ALBUTEROL SULFATE (2.5 MG/3ML) 0.083% IN NEBU: 2.5000 mg | INHALATION_SOLUTION | Freq: Four times a day (QID) | RESPIRATORY_TRACT | Status: DC | PRN

## 2020-02-18 MED ORDER — ENOXAPARIN SODIUM 40 MG/0.4ML ~~LOC~~ SOLN
40.0000 mg | SUBCUTANEOUS | Status: DC
  Administered 2020-02-18: 40 mg via SUBCUTANEOUS
  Filled 2020-02-18: qty 0.4

## 2020-02-18 NOTE — ED Provider Notes (Signed)
[facility] EMERGENCY DEPARTMENT Provider Note   CSN: [id number] Arrival date & time: 02/18/20  1246     History Chief Complaint  Patient presents with  . Shortness of Breath    [name] is a 86 y.o. female.  86 year old female with prior medical history detailed below presents for evaluation of confusion and altered mental status.  Patient's family reports that she was confused more so than normal this morning.  Patient does have a history of advanced dementia.  She has a DNR.  Husband is at bedside providing majority of history.  Level 5 caveat secondary to patient's advanced dementia.  The history is provided by the patient and the spouse.  Altered Mental Status Presenting symptoms: confusion and lethargy   Severity:  Moderate Most recent episode:  Today Duration:  1 day Timing:  Constant Progression:  Waxing and waning Chronicity:  New      Past Medical History:  Diagnosis Date  . Anxiety   . Asthma   . Depression   . Diabetes mellitus without complication ([place])    1/70/01.Marland KitchenMarland Kitchenpt denies  . GERD (gastroesophageal reflux disease)   . Hearing loss   . Hypertension   . NHL (non-Hodgkin's lymphoma) ([place])    nhl dx 9/04 breast ca dx1/12  . Parkinson disease [facility])     Patient Active Problem List   Diagnosis Date Noted  . Recurrent left pleural effusion   . Weakness   . Palliative care by specialist   . Goals of care, counseling/discussion   . DNR (do not resuscitate)   . Hemothorax 01/23/2020  . Multiple rib fractures 01/23/2020  . Lumbar transverse process fracture ([place]) 01/23/2020  . Hypertensive urgency 01/23/2020  . Anemia 01/23/2020  . Closed fracture of right proximal humerus 05/31/2019  . GIB (gastrointestinal bleeding) 02/03/2017  . CAP (community acquired pneumonia) 04/13/2016  . PNA (pneumonia) 04/13/2016  . Murmur 09/02/2015  . Parkinson's disease ([place]) 09/02/2015  . Bilateral carotid bruits 09/02/2015  .  Confusion 08/07/2015  . Acute encephalopathy 08/07/2015  . Lower urinary tract infectious disease 08/07/2015  . Acute kidney failure ([place]) 08/07/2015  . Paralysis agitans ([place]) 09/19/2012  . Lymphoma ([place]) 07/03/2011  . Malignant neoplasm of female breast ([place]) 07/03/2011  . Abnormal CT of the chest 03/13/2011  . HYPERTENSION, BENIGN 01/01/2009  . EDEMA 01/01/2009    Past Surgical History:  Procedure Laterality Date  . Ba-HA Ear implant    . ESOPHAGOGASTRODUODENOSCOPY N/A 02/04/2017   Procedure: ESOPHAGOGASTRODUODENOSCOPY (EGD);  Surgeon: Arta Silence, MD;  Location: [facility];  Service: Endoscopy;  Laterality: N/A;  . IR THORACENTESIS ASP PLEURAL SPACE W/IMG GUIDE  01/30/2020  . MASTECTOMY  2 /8/ 12   bilateral  . MASTECTOMY       OB History   No obstetric history on file.     Family History  Problem Relation Age of Onset  . Lung cancer Father   . Cancer Father        lung  . Cancer Mother     Social History   Tobacco Use  . Smoking status: Never Smoker  . Smokeless tobacco: Never Used  . Tobacco comment: husband wsa smoker  Vaping Use  . Vaping Use: Never used  Substance Use Topics  . Alcohol use: No    Comment: no  . Drug use: No    Home Medications Prior to Admission medications   Medication Sig Start Date End Date Taking? Authorizing Provider  acetaminophen (TYLENOL) 325 MG tablet  Take 650 mg by mouth every 4 (four) hours as needed for mild pain, moderate pain, fever or headache.     [provider]  amLODipine (NORVASC) 10 MG tablet Take 1 tablet (10 mg total) by mouth daily. 02/03/20   Shelly Coss, MD  Calcium Carbonate (CALCIUM-CARB 600 PO) Take 600 mg by mouth daily.    [provider]  carbidopa-levodopa (SINEMET IR) 25-100 MG tablet TAKE 1 TABLET BY MOUTH DAILY EVERY MORNING, 1 IN THE AFTERNOON, AND 2 IN THE EVENING Patient taking differently: Take 1-2 tablets by mouth See admin instructions. Take 1 tablet in the morning and  1 tablet in the afternoon and 2 tablets in the evening. 12/19/19   Tat, Eustace Quail, DO  cholecalciferol (VITAMIN D) 1000 units tablet Take 1,000 Units by mouth every evening.     [provider]  methocarbamol (ROBAXIN) 500 MG tablet Take 1 tablet (500 mg total) by mouth every 8 (eight) hours as needed for muscle spasms. 02/02/20   Shelly Coss, MD  Multiple Vitamins-Minerals (MULTIVITAMIN WITH MINERALS) tablet Take 1 tablet by mouth daily with breakfast.     [provider]  oxyCODONE (OXY IR/ROXICODONE) 5 MG immediate release tablet Take 1 tablet (5 mg total) by mouth every 6 (six) hours as needed for moderate pain or severe pain. 02/02/20   Shelly Coss, MD  pantoprazole (PROTONIX) 40 MG tablet Take 1 tablet (40 mg total) by mouth daily. 02/03/20   Shelly Coss, MD  PRESCRIPTION MEDICATION Parkinson's Medication    [provider]  QUEtiapine (SEROQUEL) 25 MG tablet Take 1 tablet (25 mg total) by mouth at bedtime. 02/02/20   Shelly Coss, MD  sertraline (ZOLOFT) 100 MG tablet Take 1 tablet (100 mg total) by mouth daily. 03/04/18   Tat, Eustace Quail, DO  tamsulosin (FLOMAX) 0.4 MG CAPS capsule Take 1 capsule (0.4 mg total) by mouth daily. 02/03/20   Shelly Coss, MD    Allergies    Azithromycin and Tramadol  Review of Systems   Review of Systems  Psychiatric/Behavioral: Positive for confusion.  All other systems reviewed and are negative.   Physical Exam Updated Vital Signs BP (!) 159/82 (BP Location: Right Arm)   Pulse 79   Temp 98 F (36.7 C) (Oral)   LMP  (LMP Unknown)   SpO2 99%   Physical Exam Vitals and nursing note reviewed.  Constitutional:      General: She is not in acute distress.    Appearance: She is well-developed.  HENT:     Head: Normocephalic and atraumatic.  Eyes:     Conjunctiva/sclera: Conjunctivae normal.     Pupils: Pupils are equal, round, and reactive to light.  Cardiovascular:     Rate and Rhythm: Normal rate and regular  rhythm.     Heart sounds: Normal heart sounds.  Pulmonary:     Effort: Pulmonary effort is normal. No respiratory distress.     Breath sounds: Normal breath sounds.  Abdominal:     General: There is no distension.     Palpations: Abdomen is soft.     Tenderness: There is no abdominal tenderness.  Musculoskeletal:        General: No deformity. Normal range of motion.     Cervical back: Normal range of motion and neck supple.  Skin:    General: Skin is warm and dry.  Neurological:     General: No focal deficit present.     Mental Status: She is alert.  Comments: Pleasantly demented  Alert to person only  No focal deficit noted     ED Results / Procedures / Treatments   Labs (all labs ordered are listed, but only abnormal results are displayed) Labs Reviewed  URINALYSIS, ROUTINE W REFLEX MICROSCOPIC - Abnormal; Notable for the following components:      Result Value   Color, Urine AMBER (*)    APPearance HAZY (*)    Nitrite POSITIVE (*)    Leukocytes,Ua LARGE (*)    Bacteria, UA MANY (*)    All other components within normal limits  CBC WITH DIFFERENTIAL/PLATELET - Abnormal; Notable for the following components:   WBC 3.7 (*)    Hemoglobin 10.2 (*)    HCT 33.7 (*)    All other components within normal limits  COMPREHENSIVE METABOLIC PANEL - Abnormal; Notable for the following components:   Glucose, Bld 104 (*)    Calcium 8.7 (*)    Total Protein 5.3 (*)    Albumin 2.7 (*)    All other components within normal limits  SARS CORONAVIRUS 2 BY RT PCR (HOSPITAL ORDER, [facility])  LACTIC ACID, PLASMA  LACTIC ACID, PLASMA  TROPONIN I (HIGH SENSITIVITY)  TROPONIN I (HIGH SENSITIVITY)    EKG EKG Interpretation  Date/Time:  Sunday February 18 2020 13:09:08 EDT Ventricular Rate:  83 PR Interval:    QRS Duration: 95 QT Interval:  408 QTC Calculation: 480 R Axis:   31 Text Interpretation: Sinus rhythm Multiple ventricular premature complexes  LVH with secondary repolarization abnormality Anterior Q waves, possibly due to LVH Confirmed by Dene Gentry [id number]) on 02/18/2020 1:36:45 PM   Radiology DG Chest Port 1 View  Result Date: 02/18/2020 CLINICAL DATA:  Syncopal episode, shortness of breath, bradycardia, episode of cyanosis; history dementia, Parkinson's disease, diabetes mellitus, hypertension, past history non-Hodgkin's lymphoma EXAM: PORTABLE CHEST 1 VIEW COMPARISON:  Portable exam 1346 hours compared to 02/03/2020 FINDINGS: Enlargement of cardiac silhouette. Mediastinal contours and pulmonary vascularity normal. Atherosclerotic calcification aorta. Persistent LEFT pleural effusion and basilar atelectasis. Underlying emphysematous changes with stable postsurgical changes at the lower RIGHT lung. No new infiltrate or pneumothorax. Bones demineralized. IMPRESSION: Enlargement of cardiac silhouette with persistent LEFT pleural effusion and basilar atelectasis. Enlargement of cardiac silhouette. Electronically Signed   By: Lavonia Dana M.D.   On: 02/18/2020 14:08    Procedures Procedures (including critical care time)  Medications Ordered in ED Medications  cefTRIAXone (ROCEPHIN) 1 g in sodium chloride 0.9 % 100 mL IVPB (has no administration in time range)    ED Course  I have reviewed the triage vital signs and the nursing notes.  Pertinent labs & imaging results that were available during my care of the patient were reviewed by me and considered in my medical decision making (see chart for details).  Clinical Course as of Feb 17 1625  Sun Feb 18, 2020  1552 Troponin I (High Sensitivity): 17 [PM]    Clinical Course User Index [PM] Valarie Merino, MD   MDM Rules/Calculators/A&P                          MDM  Screen complete  Adriena Manfre was evaluated in Emergency Department on 02/18/2020 for the symptoms described in the history of present illness. She was evaluated in the context of the global COVID-19  pandemic, which necessitated consideration that the patient might be at risk for infection with the SARS-CoV-2 virus that causes  COVID-19. Institutional protocols and algorithms that pertain to the evaluation of patients at risk for COVID-19 are in a state of rapid change based on information released by regulatory bodies including the CDC and federal and state organizations. These policies and algorithms were followed during the patient's care in the ED.  Patient is presenting for evaluation of reported change in mental status.  Work-up suggest possibility of UTI being the cause of the patient's change in mentation.  Patient would benefit from admission for further work-up and treatment.  Hospitalist service is aware of case and will evaluate for admission.  Final Clinical Impression(s) / ED Diagnoses Final diagnoses:  Urinary tract infection without hematuria, site unspecified  Altered mental status, unspecified altered mental status type    Rx / DC Orders ED Discharge Orders    None       Valarie Merino, MD 02/18/20 [phone]

## 2020-02-18 NOTE — ED Triage Notes (Signed)
Pt BIB [facility] from [facility] due to sob and bradycardia. Per EMS staff report she was doing morning rehab when she became cyanotic and sob. Pt has dementia baseline. Per [facility] pt was 98 on 2L and 80 HR. Pt arrived on monitor with 83HR.[name] 96 RA.
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
## 2020-02-19 LAB — CBC
HCT: 31.2 % — ABNORMAL LOW (ref 36.0–46.0)
Hemoglobin: 9.3 g/dL — ABNORMAL LOW (ref 12.0–15.0)
MCH: 25.5 pg — ABNORMAL LOW (ref 26.0–34.0)
MCHC: 29.8 g/dL — ABNORMAL LOW (ref 30.0–36.0)
MCV: 85.7 fL (ref 80.0–100.0)
Platelets: 183 10*3/uL (ref 150–400)
RBC: 3.64 MIL/uL — ABNORMAL LOW (ref 3.87–5.11)
RDW: 14 % (ref 11.5–15.5)
WBC: 3.2 10*3/uL — ABNORMAL LOW (ref 4.0–10.5)
nRBC: 0 % (ref 0.0–0.2)

## 2020-02-19 LAB — COMPREHENSIVE METABOLIC PANEL
ALT: 7 U/L (ref 0–44)
AST: 15 U/L (ref 15–41)
Albumin: 2.5 g/dL — ABNORMAL LOW (ref 3.5–5.0)
Alkaline Phosphatase: 112 U/L (ref 38–126)
Anion gap: 9 (ref 5–15)
BUN: 8 mg/dL (ref 8–23)
CO2: 26 mmol/L (ref 22–32)
Calcium: 8.4 mg/dL — ABNORMAL LOW (ref 8.9–10.3)
Chloride: 109 mmol/L (ref 98–111)
Creatinine, Ser: 0.75 mg/dL (ref 0.44–1.00)
GFR calc Af Amer: >60 mL/min (ref >60)
GFR calc non Af Amer: >60 mL/min (ref >60)
Glucose, Bld: 83 mg/dL (ref 70–99)
Potassium: 3.4 mmol/L — ABNORMAL LOW (ref 3.5–5.1)
Sodium: 144 mmol/L (ref 135–145)
Total Bilirubin: 0.3 mg/dL (ref 0.3–1.2)
Total Protein: 4.9 g/dL — ABNORMAL LOW (ref 6.5–8.1)

## 2020-02-19 MED ORDER — SODIUM CHLORIDE 0.9 % IV SOLN: INTRAVENOUS | Status: AC

## 2020-02-19 MED ORDER — AMLODIPINE BESYLATE 10 MG PO TABS
10.0000 mg | ORAL_TABLET | Freq: Every day | ORAL | Status: DC
  Administered 2020-02-19 – 2020-02-20 (×2): 10 mg via ORAL
  Filled 2020-02-19: qty 2
  Filled 2020-02-19: qty 1

## 2020-02-19 MED ORDER — POTASSIUM CHLORIDE 10 MEQ/100ML IV SOLN
10.0000 meq | INTRAVENOUS | Status: AC
  Administered 2020-02-19 (×3): 10 meq via INTRAVENOUS
  Filled 2020-02-19 (×3): qty 100

## 2020-02-19 NOTE — ED Notes (Signed)
Lunch Tray Ordered @ 1027. °
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
## 2020-02-20 LAB — URINE CULTURE: Culture: >=100000 — AB

## 2020-02-20 LAB — BASIC METABOLIC PANEL
Anion gap: 9 (ref 5–15)
BUN: 8 mg/dL (ref 8–23)
CO2: 24 mmol/L (ref 22–32)
Calcium: 8.5 mg/dL — ABNORMAL LOW (ref 8.9–10.3)
Chloride: 106 mmol/L (ref 98–111)
Creatinine, Ser: 0.71 mg/dL (ref 0.44–1.00)
GFR calc Af Amer: >60 mL/min (ref >60)
GFR calc non Af Amer: >60 mL/min (ref >60)
Glucose, Bld: 98 mg/dL (ref 70–99)
Potassium: 3.6 mmol/L (ref 3.5–5.1)
Sodium: 139 mmol/L (ref 135–145)

## 2020-02-20 LAB — CBC WITH DIFFERENTIAL/PLATELET
Abs Immature Granulocytes: 0.07 10*3/uL (ref 0.00–0.07)
Basophils Absolute: 0.1 10*3/uL (ref 0.0–0.1)
Basophils Relative: 1 %
Eosinophils Absolute: 0.1 10*3/uL (ref 0.0–0.5)
Eosinophils Relative: 3 %
HCT: 30.7 % — ABNORMAL LOW (ref 36.0–46.0)
Hemoglobin: 9.7 g/dL — ABNORMAL LOW (ref 12.0–15.0)
Immature Granulocytes: 2 %
Lymphocytes Relative: 23 %
Lymphs Abs: 0.9 10*3/uL (ref 0.7–4.0)
MCH: 26.4 pg (ref 26.0–34.0)
MCHC: 31.6 g/dL (ref 30.0–36.0)
MCV: 83.4 fL (ref 80.0–100.0)
Monocytes Absolute: 0.3 10*3/uL (ref 0.1–1.0)
Monocytes Relative: 8 %
Neutro Abs: 2.6 10*3/uL (ref 1.7–7.7)
Neutrophils Relative %: 63 %
Platelets: 228 10*3/uL (ref 150–400)
RBC: 3.68 MIL/uL — ABNORMAL LOW (ref 3.87–5.11)
RDW: 14.2 % (ref 11.5–15.5)
WBC: 4 10*3/uL (ref 4.0–10.5)
nRBC: 0 % (ref 0.0–0.2)

## 2020-02-20 MED ORDER — LISINOPRIL 5 MG PO TABS
2.5000 mg | ORAL_TABLET | Freq: Every day | ORAL | Status: DC
  Administered 2020-02-20: 2.5 mg via ORAL
  Filled 2020-02-20: qty 1

## 2020-02-20 MED ORDER — LISINOPRIL 2.5 MG PO TABS: 2.5000 mg | ORAL_TABLET | Freq: Every day | ORAL | Status: DC

## 2020-02-20 MED ORDER — CEPHALEXIN 500 MG PO CAPS: 500.0000 mg | ORAL_CAPSULE | Freq: Three times a day (TID) | ORAL | 0 refills | Status: AC
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
## 2020-02-20 NOTE — Discharge Summary (Signed)
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 Section 564(b)(1) of the Act, 21 U.S.C. section 360bbb-3(b)(1), unless the authorization is terminated or revoked sooner.  Performed at [facility], [place] [address], [place], [place] [address]   Urine culture     Status: Abnormal   Collection Time: 02/18/20  3:56 PM   Specimen: Urine, Random  Result Value Ref Range Status   Specimen Description URINE, RANDOM  Final   Special Requests   Final    NONE Performed at [facility], [place] [address] [place], [place] [address]    Culture >=100,000 COLONIES/mL ESCHERICHIA COLI (A)  Final   Report Status 02/20/2020 FINAL  Final   Organism ID, Bacteria ESCHERICHIA COLI (A)  Final      Susceptibility   Escherichia coli - MIC*    AMPICILLIN 8 SENSITIVE Sensitive     CEFAZOLIN <=4 SENSITIVE Sensitive     CEFTRIAXONE <=0.25 SENSITIVE Sensitive     CIPROFLOXACIN <=0.25 SENSITIVE Sensitive     GENTAMICIN <=1 SENSITIVE Sensitive     IMIPENEM <=0.25 SENSITIVE Sensitive     NITROFURANTOIN <=16 SENSITIVE Sensitive      TRIMETH/SULFA <=20 SENSITIVE Sensitive     AMPICILLIN/SULBACTAM 4 SENSITIVE Sensitive     PIP/TAZO <=4 SENSITIVE Sensitive     * >=100,000 COLONIES/mL ESCHERICHIA COLI  Culture, blood (routine x 2)     Status: None (Preliminary result)   Collection Time: 02/18/20  3:57 PM   Specimen: BLOOD  Result Value Ref Range Status   Specimen Description BLOOD BLOOD RIGHT HAND  Final   Special Requests   Final    BOTTLES DRAWN AEROBIC AND ANAEROBIC Blood Culture results may not be optimal due to an inadequate volume of blood received in culture bottles   Culture   Final    NO GROWTH < 24 HOURS Performed at [facility], [address] [place], [place] [address]    Report Status PENDING  Incomplete  Culture, blood (routine x 2)     Status: None (Preliminary result)   Collection Time: 02/18/20  4:02 PM   Specimen: BLOOD  Result Value Ref Range Status   Specimen Description BLOOD SITE NOT SPECIFIED  Final   Special Requests   Final    BOTTLES DRAWN AEROBIC AND ANAEROBIC Blood Culture results may not be optimal due to an inadequate volume of blood received in culture bottles   Culture   Final    NO GROWTH < 24 HOURS Performed at [facility], [place] [address], [place],  [address]    Report Status PENDING  Incomplete     Labs: BNP (last 3 results) No results for input(s): BNP in the last 8760 hours. Basic Metabolic Panel: Recent Labs  Lab 02/18/20 1312 02/18/20 1734 02/19/20 0440 02/20/20 0121  NA 143  --  144 139  K 3.6  --  3.4* 3.6  CL 108  --  109 106  CO2 27  --  26 24  GLUCOSE 104*  --  83 98  BUN 11  --  8 8  CREATININE 0.76 0.68 0.75 0.71  CALCIUM 8.7*  --  8.4* 8.5*  MG  --  1.7  --   --   PHOS  --  3.3  --   --    Liver Function Tests: Recent Labs  Lab 02/18/20 1312 02/19/20 0440  AST 20 15  ALT <5 7  ALKPHOS 121 112  BILITOT 0.7 0.3  PROT 5.3* 4.9*  ALBUMIN 2.7* 2.5*   No results for input(s): LIPASE, AMYLASE in the last 168 hours. No  results for input(s): AMMONIA in the last 168 hours. CBC: Recent Labs  Lab 02/18/20 1312 02/19/20 0440 02/20/20 0121  WBC 3.7* 3.2* 4.0  NEUTROABS 2.4  --  2.6  HGB 10.2* 9.3* 9.7*  HCT 33.7* 31.2* 30.7*  MCV 86.6 85.7 83.4  PLT 208 183 228   Cardiac Enzymes: No results for input(s): CKTOTAL, CKMB, CKMBINDEX, TROPONINI in the last 168 hours. BNP: Invalid input(s): POCBNP CBG: No results for input(s): GLUCAP in the last 168 hours. D-Dimer No results for input(s): DDIMER in the last 72 hours. Hgb A1c No results for input(s): HGBA1C in the last 72 hours. Lipid Profile No results for input(s): CHOL, HDL, LDLCALC, TRIG, CHOLHDL, LDLDIRECT in the last 72 hours. Thyroid function studies Recent Labs    02/18/20 1734  TSH 4.271   Anemia work up No results for input(s): VITAMINB12, FOLATE, FERRITIN, TIBC, IRON, RETICCTPCT in the last 72 hours. Urinalysis    Component Value Date/Time   COLORURINE AMBER (A) 02/18/2020 1510   APPEARANCEUR HAZY (A) 02/18/2020 1510   LABSPEC 1.013 02/18/2020 1510   LABSPEC 1.015 06/05/2008 0907   PHURINE 6.0 02/18/2020 1510   GLUCOSEU NEGATIVE 02/18/2020 1510   HGBUR NEGATIVE 02/18/2020 1510   BILIRUBINUR NEGATIVE 02/18/2020 1510   BILIRUBINUR Negative 06/05/2008 0907   KETONESUR NEGATIVE 02/18/2020 1510   PROTEINUR NEGATIVE 02/18/2020 1510   UROBILINOGEN 0.2 09/20/2012 0117   NITRITE POSITIVE (A) 02/18/2020 1510   LEUKOCYTESUR LARGE (A) 02/18/2020 1510   LEUKOCYTESUR Negative 06/05/2008 0907   Sepsis Labs Invalid input(s): PROCALCITONIN,  WBC,  LACTICIDVEN Microbiology Recent Results (from the past 240 hour(s))  SARS Coronavirus 2 by RT PCR (hospital order, performed in [place] hospital lab) Nasopharyngeal Nasopharyngeal Swab     Status: None   Collection Time: 02/18/20  1:27 PM   Specimen: Nasopharyngeal Swab  Result Value Ref Range Status   SARS Coronavirus 2 NEGATIVE NEGATIVE Final    Comment: (NOTE) SARS-CoV-2 target nucleic  acids are NOT DETECTED.  The SARS-CoV-2 RNA is generally detectable in upper and lower respiratory specimens during the acute phase of infection. The lowest concentration of SARS-CoV-2 viral copies this assay can detect is 250 copies / mL. A negative result does not preclude SARS-CoV-2 infection and should not be used as the sole basis for treatment or other patient management decisions.  A negative result may occur with improper specimen collection / handling, submission of specimen other than nasopharyngeal swab, presence of viral mutation(s) within the areas targeted by this assay, and inadequate number of viral copies (<250 copies / mL). A negative result must be combined with clinical observations, patient history, and epidemiological information.  Fact Sheet for Patients:   StrictlyIdeas.no  Fact Sheet for Healthcare Providers: BankingDealers.co.za  This test is not yet approved or  cleared by the [place] FDA and has been authorized for detection and/or diagnosis of SARS-CoV-2 by FDA under an Emergency Use Authorization (EUA).  This EUA will remain in effect (meaning this test can be used) for the duration of the COVID-19 declaration under Section 564(b)(1) of the Act, 21 U.S.C. section 360bbb-3(b)(1), unless the authorization is terminated or revoked sooner.  Performed at [facility], [place] [address] [place], [place] [address]   Urine culture     Status: Abnormal   Collection Time: 02/18/20  3:56 PM   Specimen: Urine, Random  Result Value Ref Range Status   Specimen Description URINE, RANDOM  Final   Special Requests   Final    NONE Performed at [facility], [place] [address] [place], [place] [address]    Culture >=100,000 COLONIES/mL ESCHERICHIA COLI (A)  Final   Report Status 02/20/2020 FINAL  Final   Organism ID, Bacteria ESCHERICHIA COLI (A)  Final      Susceptibility   Escherichia coli - MIC*     AMPICILLIN 8 SENSITIVE Sensitive     CEFAZOLIN <=4 SENSITIVE Sensitive     CEFTRIAXONE <=0.25 SENSITIVE Sensitive     CIPROFLOXACIN <=0.25 SENSITIVE Sensitive     GENTAMICIN <=1 SENSITIVE Sensitive     IMIPENEM <=0.25 SENSITIVE Sensitive     NITROFURANTOIN <=16 SENSITIVE Sensitive     TRIMETH/SULFA <=20 SENSITIVE Sensitive     AMPICILLIN/SULBACTAM 4 SENSITIVE Sensitive     PIP/TAZO <=4 SENSITIVE Sensitive     * >=100,000 COLONIES/mL ESCHERICHIA COLI  Culture, blood (routine x 2)     Status: None (Preliminary result)   Collection Time: 02/18/20  3:57 PM   Specimen: BLOOD  Result Value Ref Range Status   Specimen Description BLOOD BLOOD RIGHT HAND  Final   Special Requests   Final    BOTTLES DRAWN AEROBIC AND ANAEROBIC Blood Culture results may not be optimal due to an inadequate volume of blood received in culture bottles   Culture   Final    NO GROWTH < 24 HOURS Performed at [facility], [address] [place], [place] [address]    Report Status PENDING  Incomplete  Culture, blood (routine x 2)     Status: None (Preliminary result)   Collection Time: 02/18/20  4:02 PM   Specimen: BLOOD  Result Value Ref Range Status   Specimen Description BLOOD SITE NOT SPECIFIED  Final   Special Requests   Final    BOTTLES DRAWN AEROBIC AND ANAEROBIC Blood Culture results may not be optimal due to an inadequate volume of blood received in culture bottles   Culture   Final    NO GROWTH < 24 HOURS Performed at [facility], [place] [address], [place], [place] [address]    Report Status PENDING  Incomplete    Please note: You were cared for by a hospitalist during your hospital stay. Once you are discharged, your primary care physician will handle any further medical issues. Please note that NO REFILLS for any discharge medications will be authorized once you are discharged, as it is imperative that you return to your primary care physician (or establish a relationship with a primary  care physician if you do not have one) for your post hospital discharge needs so that they can reassess your need for medications and monitor your lab values.    Time  coordinating discharge: 40 minutes  SIGNED:   Shelly Coss, MD  [facility] 02/20/2020, 12:38 PM Pager [phone]  If 7PM-7AM, please contact night-coverage [email or web address] Password [email or web address]

## 2020-02-20 NOTE — NC FL2 (Signed)
Show Low LEVEL OF CARE SCREENING TOOL     IDENTIFICATION  Patient Name: [name] Birthdate: 09/09/1927 Sex: female Admission Date (Current Location): 02/18/2020  [facility] and [place] Number:  Herbalist and Address:  [facility] [place], [employer] [address] [place], [place] [address]      Provider Number: [id number]  Attending Physician Name and Address:  Shelly Coss, MD  Relative Name and Phone Number:  [name], spouse, [phone]    Current Level of Care: Hospital Recommended Level of Care: [place] Prior Approval Number:    Date Approved/Denied:   PASRR Number: [id number]  Discharge Plan: SNF    Current Diagnoses: Patient Active Problem List   Diagnosis Date Noted  . GERD (gastroesophageal reflux disease)   . Depression   . Acute metabolic encephalopathy   . Leukopenia   . Recurrent left pleural effusion   . Weakness   . Palliative care by specialist   . Goals of care, counseling/discussion   . DNR (do not resuscitate)   . Hemothorax 01/23/2020  . Multiple rib fractures 01/23/2020  . Lumbar transverse process fracture ([place]) 01/23/2020  . Hypertensive urgency 01/23/2020  . Anemia 01/23/2020  . Closed fracture of right proximal humerus 05/31/2019  . GIB (gastrointestinal bleeding) 02/03/2017  . CAP (community acquired pneumonia) 04/13/2016  . PNA (pneumonia) 04/13/2016  . Murmur 09/02/2015  . Parkinson's disease ([place]) 09/02/2015  . Bilateral carotid bruits 09/02/2015  . Confusion 08/07/2015  . Acute encephalopathy 08/07/2015  . Lower urinary tract infectious disease 08/07/2015  . Acute kidney failure ([place]) 08/07/2015  . Paralysis agitans ([place]) 09/19/2012  . Lymphoma ([place]) 07/03/2011  . Malignant neoplasm of female breast ([place]) 07/03/2011  . Abnormal CT of the chest 03/13/2011  . HYPERTENSION, BENIGN 01/01/2009  . EDEMA 01/01/2009    Orientation RESPIRATION BLADDER Height & Weight      Self, Place    Continent, External catheter Weight:   Height:     BEHAVIORAL SYMPTOMS/MOOD NEUROLOGICAL BOWEL NUTRITION STATUS      Continent Diet  AMBULATORY STATUS COMMUNICATION OF NEEDS Skin   Extensive Assist Verbally Normal                       Personal Care Assistance Level of Assistance  Bathing, Feeding, Dressing Bathing Assistance: Maximum assistance Feeding assistance: Maximum assistance Dressing Assistance: Maximum assistance     Functional Limitations Info  Sight, Hearing, Speech Sight Info: Adequate Hearing Info: Impaired Speech Info: Adequate    SPECIAL CARE FACTORS FREQUENCY  PT (By licensed PT), OT (By licensed OT)     PT Frequency: 5x week OT Frequency: 5x week            Contractures Contractures Info: Not present    Additional Factors Info  Code Status, Allergies, Psychotropic Code Status Info: DNR Allergies Info: Azithromycin, Tramadol           Current Medications (02/20/2020):  This is the current hospital active medication list Current Facility-Administered Medications  Medication Dose Route Frequency Provider Last Rate Last Admin  . acetaminophen (TYLENOL) tablet 650 mg  650 mg Oral Q6H PRN Pahwani, Rinka R, MD       Or  . acetaminophen (TYLENOL) suppository 650 mg  650 mg Rectal Q6H PRN Pahwani, Rinka R, MD      . albuterol (PROVENTIL) (2.5 MG/3ML) 0.083% nebulizer solution 2.5 mg  2.5 mg Nebulization Q6H PRN Pahwani, Michell Heinrich, MD      .  amLODipine (NORVASC) tablet 10 mg  10 mg Oral Daily Shelly Coss, MD   10 mg at 02/20/20 0924  . cefTRIAXone (ROCEPHIN) 1 g in sodium chloride 0.9 % 100 mL IVPB  1 g Intravenous Q24H Pahwani, Rinka R, MD      . enoxaparin (LOVENOX) injection 40 mg  40 mg Subcutaneous Q24H Pahwani, Rinka R, MD   40 mg at 02/18/20 1823  . hydrALAZINE (APRESOLINE) injection 10 mg  10 mg Intravenous Q6H PRN Pahwani, Rinka R, MD   10 mg at 02/20/20 0443  . lip balm (BLISTEX) ointment   Topical PRN Pahwani, Rinka R,  MD      . lisinopril (ZESTRIL) tablet 2.5 mg  2.5 mg Oral Daily Shelly Coss, MD   2.5 mg at 02/20/20 0924  . ondansetron (ZOFRAN) tablet 4 mg  4 mg Oral Q6H PRN Pahwani, Rinka R, MD       Or  . ondansetron (ZOFRAN) injection 4 mg  4 mg Intravenous Q6H PRN Pahwani, Rinka R, MD         Discharge Medications: Please see discharge summary for a list of discharge medications.  Relevant Imaging Results:  Relevant Lab Results:   Additional Information SSn: [id number], Therapist, sports
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
## 2020-02-21 DIAGNOSIS — G20 Parkinson's disease: Secondary | ICD-10-CM | POA: Diagnosis not present

## 2020-02-21 DIAGNOSIS — F0281 Dementia in other diseases classified elsewhere with behavioral disturbance: Secondary | ICD-10-CM | POA: Diagnosis not present

## 2020-02-21 DIAGNOSIS — D649 Anemia, unspecified: Secondary | ICD-10-CM | POA: Diagnosis not present

## 2020-02-21 DIAGNOSIS — K219 Gastro-esophageal reflux disease without esophagitis: Secondary | ICD-10-CM | POA: Diagnosis not present

## 2020-02-21 DIAGNOSIS — N3090 Cystitis, unspecified without hematuria: Secondary | ICD-10-CM | POA: Diagnosis not present

## 2020-02-22 DIAGNOSIS — G20 Parkinson's disease: Secondary | ICD-10-CM | POA: Diagnosis not present

## 2020-02-22 DIAGNOSIS — D649 Anemia, unspecified: Secondary | ICD-10-CM | POA: Diagnosis not present

## 2020-02-22 DIAGNOSIS — K219 Gastro-esophageal reflux disease without esophagitis: Secondary | ICD-10-CM | POA: Diagnosis not present

## 2020-02-22 DIAGNOSIS — R339 Retention of urine, unspecified: Secondary | ICD-10-CM | POA: Diagnosis not present

## 2020-02-23 LAB — CULTURE, BLOOD (ROUTINE X 2)
Culture: NO GROWTH
Culture: NO GROWTH

## 2020-03-06 DIAGNOSIS — G20 Parkinson's disease: Secondary | ICD-10-CM | POA: Diagnosis not present

## 2020-03-06 DIAGNOSIS — S2242XD Multiple fractures of ribs, left side, subsequent encounter for fracture with routine healing: Secondary | ICD-10-CM | POA: Diagnosis not present

## 2020-03-06 DIAGNOSIS — K219 Gastro-esophageal reflux disease without esophagitis: Secondary | ICD-10-CM | POA: Diagnosis not present

## 2020-03-06 DIAGNOSIS — D649 Anemia, unspecified: Secondary | ICD-10-CM | POA: Diagnosis not present

## 2020-03-08 DIAGNOSIS — G20 Parkinson's disease: Secondary | ICD-10-CM | POA: Diagnosis not present

## 2020-03-08 DIAGNOSIS — N3090 Cystitis, unspecified without hematuria: Secondary | ICD-10-CM | POA: Diagnosis not present

## 2020-03-08 DIAGNOSIS — R339 Retention of urine, unspecified: Secondary | ICD-10-CM | POA: Diagnosis not present

## 2020-03-08 DIAGNOSIS — D649 Anemia, unspecified: Secondary | ICD-10-CM | POA: Diagnosis not present

## 2020-03-13 DIAGNOSIS — K219 Gastro-esophageal reflux disease without esophagitis: Secondary | ICD-10-CM | POA: Diagnosis not present

## 2020-03-13 DIAGNOSIS — F0281 Dementia in other diseases classified elsewhere with behavioral disturbance: Secondary | ICD-10-CM | POA: Diagnosis not present

## 2020-03-13 DIAGNOSIS — G20 Parkinson's disease: Secondary | ICD-10-CM | POA: Diagnosis not present

## 2020-03-13 DIAGNOSIS — D649 Anemia, unspecified: Secondary | ICD-10-CM | POA: Diagnosis not present

## 2020-03-13 DIAGNOSIS — F3341 Major depressive disorder, recurrent, in partial remission: Secondary | ICD-10-CM | POA: Diagnosis not present

## 2020-03-18 DIAGNOSIS — D649 Anemia, unspecified: Secondary | ICD-10-CM | POA: Diagnosis not present

## 2020-03-18 DIAGNOSIS — G20 Parkinson's disease: Secondary | ICD-10-CM | POA: Diagnosis not present

## 2020-03-18 DIAGNOSIS — S32009D Unspecified fracture of unspecified lumbar vertebra, subsequent encounter for fracture with routine healing: Secondary | ICD-10-CM | POA: Diagnosis not present

## 2020-03-18 DIAGNOSIS — F0281 Dementia in other diseases classified elsewhere with behavioral disturbance: Secondary | ICD-10-CM | POA: Diagnosis not present

## 2020-03-18 DIAGNOSIS — K219 Gastro-esophageal reflux disease without esophagitis: Secondary | ICD-10-CM | POA: Diagnosis not present

## 2020-03-18 DIAGNOSIS — S2242XD Multiple fractures of ribs, left side, subsequent encounter for fracture with routine healing: Secondary | ICD-10-CM | POA: Diagnosis not present

## 2020-03-20 DIAGNOSIS — G9341 Metabolic encephalopathy: Secondary | ICD-10-CM | POA: Diagnosis not present

## 2020-03-20 DIAGNOSIS — S32019D Unspecified fracture of first lumbar vertebra, subsequent encounter for fracture with routine healing: Secondary | ICD-10-CM | POA: Diagnosis not present

## 2020-03-20 DIAGNOSIS — S2242XD Multiple fractures of ribs, left side, subsequent encounter for fracture with routine healing: Secondary | ICD-10-CM | POA: Diagnosis not present

## 2020-03-20 DIAGNOSIS — Z9013 Acquired absence of bilateral breasts and nipples: Secondary | ICD-10-CM | POA: Diagnosis not present

## 2020-03-20 DIAGNOSIS — N1832 Chronic kidney disease, stage 3b: Secondary | ICD-10-CM | POA: Diagnosis not present

## 2020-03-20 DIAGNOSIS — D631 Anemia in chronic kidney disease: Secondary | ICD-10-CM | POA: Diagnosis not present

## 2020-03-20 DIAGNOSIS — R32 Unspecified urinary incontinence: Secondary | ICD-10-CM | POA: Diagnosis not present

## 2020-03-20 DIAGNOSIS — F329 Major depressive disorder, single episode, unspecified: Secondary | ICD-10-CM | POA: Diagnosis not present

## 2020-03-20 DIAGNOSIS — Z853 Personal history of malignant neoplasm of breast: Secondary | ICD-10-CM | POA: Diagnosis not present

## 2020-03-20 DIAGNOSIS — K219 Gastro-esophageal reflux disease without esophagitis: Secondary | ICD-10-CM | POA: Diagnosis not present

## 2020-03-20 DIAGNOSIS — I350 Nonrheumatic aortic (valve) stenosis: Secondary | ICD-10-CM | POA: Diagnosis not present

## 2020-03-20 DIAGNOSIS — R339 Retention of urine, unspecified: Secondary | ICD-10-CM | POA: Diagnosis not present

## 2020-03-20 DIAGNOSIS — Z8572 Personal history of non-Hodgkin lymphomas: Secondary | ICD-10-CM | POA: Diagnosis not present

## 2020-03-20 DIAGNOSIS — R1310 Dysphagia, unspecified: Secondary | ICD-10-CM | POA: Diagnosis not present

## 2020-03-20 DIAGNOSIS — I129 Hypertensive chronic kidney disease with stage 1 through stage 4 chronic kidney disease, or unspecified chronic kidney disease: Secondary | ICD-10-CM | POA: Diagnosis not present

## 2020-03-20 DIAGNOSIS — S32029D Unspecified fracture of second lumbar vertebra, subsequent encounter for fracture with routine healing: Secondary | ICD-10-CM | POA: Diagnosis not present

## 2020-03-20 DIAGNOSIS — G20 Parkinson's disease: Secondary | ICD-10-CM | POA: Diagnosis not present

## 2020-03-20 DIAGNOSIS — H9193 Unspecified hearing loss, bilateral: Secondary | ICD-10-CM | POA: Diagnosis not present

## 2020-03-20 DIAGNOSIS — J45909 Unspecified asthma, uncomplicated: Secondary | ICD-10-CM | POA: Diagnosis not present

## 2020-03-20 DIAGNOSIS — F0280 Dementia in other diseases classified elsewhere without behavioral disturbance: Secondary | ICD-10-CM | POA: Diagnosis not present

## 2020-03-20 DIAGNOSIS — D638 Anemia in other chronic diseases classified elsewhere: Secondary | ICD-10-CM | POA: Diagnosis not present

## 2020-03-21 DIAGNOSIS — N1832 Chronic kidney disease, stage 3b: Secondary | ICD-10-CM | POA: Diagnosis not present

## 2020-03-21 DIAGNOSIS — S32019D Unspecified fracture of first lumbar vertebra, subsequent encounter for fracture with routine healing: Secondary | ICD-10-CM | POA: Diagnosis not present

## 2020-03-21 DIAGNOSIS — D631 Anemia in chronic kidney disease: Secondary | ICD-10-CM | POA: Diagnosis not present

## 2020-03-21 DIAGNOSIS — S2242XD Multiple fractures of ribs, left side, subsequent encounter for fracture with routine healing: Secondary | ICD-10-CM | POA: Diagnosis not present

## 2020-03-21 DIAGNOSIS — I129 Hypertensive chronic kidney disease with stage 1 through stage 4 chronic kidney disease, or unspecified chronic kidney disease: Secondary | ICD-10-CM | POA: Diagnosis not present

## 2020-03-21 DIAGNOSIS — S32029D Unspecified fracture of second lumbar vertebra, subsequent encounter for fracture with routine healing: Secondary | ICD-10-CM | POA: Diagnosis not present

## 2020-03-22 DIAGNOSIS — N1832 Chronic kidney disease, stage 3b: Secondary | ICD-10-CM | POA: Diagnosis not present

## 2020-03-22 DIAGNOSIS — I129 Hypertensive chronic kidney disease with stage 1 through stage 4 chronic kidney disease, or unspecified chronic kidney disease: Secondary | ICD-10-CM | POA: Diagnosis not present

## 2020-03-22 DIAGNOSIS — S32029D Unspecified fracture of second lumbar vertebra, subsequent encounter for fracture with routine healing: Secondary | ICD-10-CM | POA: Diagnosis not present

## 2020-03-22 DIAGNOSIS — D631 Anemia in chronic kidney disease: Secondary | ICD-10-CM | POA: Diagnosis not present

## 2020-03-22 DIAGNOSIS — S32019D Unspecified fracture of first lumbar vertebra, subsequent encounter for fracture with routine healing: Secondary | ICD-10-CM | POA: Diagnosis not present

## 2020-03-22 DIAGNOSIS — S2242XD Multiple fractures of ribs, left side, subsequent encounter for fracture with routine healing: Secondary | ICD-10-CM | POA: Diagnosis not present

## 2020-03-26 DIAGNOSIS — S2242XD Multiple fractures of ribs, left side, subsequent encounter for fracture with routine healing: Secondary | ICD-10-CM | POA: Diagnosis not present

## 2020-03-26 DIAGNOSIS — N1832 Chronic kidney disease, stage 3b: Secondary | ICD-10-CM | POA: Diagnosis not present

## 2020-03-26 DIAGNOSIS — D631 Anemia in chronic kidney disease: Secondary | ICD-10-CM | POA: Diagnosis not present

## 2020-03-26 DIAGNOSIS — S32019D Unspecified fracture of first lumbar vertebra, subsequent encounter for fracture with routine healing: Secondary | ICD-10-CM | POA: Diagnosis not present

## 2020-03-26 DIAGNOSIS — I129 Hypertensive chronic kidney disease with stage 1 through stage 4 chronic kidney disease, or unspecified chronic kidney disease: Secondary | ICD-10-CM | POA: Diagnosis not present

## 2020-03-26 DIAGNOSIS — S32029D Unspecified fracture of second lumbar vertebra, subsequent encounter for fracture with routine healing: Secondary | ICD-10-CM | POA: Diagnosis not present

## 2020-03-27 DIAGNOSIS — N1832 Chronic kidney disease, stage 3b: Secondary | ICD-10-CM | POA: Diagnosis not present

## 2020-03-27 DIAGNOSIS — S2242XD Multiple fractures of ribs, left side, subsequent encounter for fracture with routine healing: Secondary | ICD-10-CM | POA: Diagnosis not present

## 2020-03-27 DIAGNOSIS — D631 Anemia in chronic kidney disease: Secondary | ICD-10-CM | POA: Diagnosis not present

## 2020-03-27 DIAGNOSIS — S32019D Unspecified fracture of first lumbar vertebra, subsequent encounter for fracture with routine healing: Secondary | ICD-10-CM | POA: Diagnosis not present

## 2020-03-27 DIAGNOSIS — S32029D Unspecified fracture of second lumbar vertebra, subsequent encounter for fracture with routine healing: Secondary | ICD-10-CM | POA: Diagnosis not present

## 2020-03-27 DIAGNOSIS — I129 Hypertensive chronic kidney disease with stage 1 through stage 4 chronic kidney disease, or unspecified chronic kidney disease: Secondary | ICD-10-CM | POA: Diagnosis not present

## 2020-03-28 DIAGNOSIS — Z23 Encounter for immunization: Secondary | ICD-10-CM | POA: Diagnosis not present

## 2020-03-28 DIAGNOSIS — I129 Hypertensive chronic kidney disease with stage 1 through stage 4 chronic kidney disease, or unspecified chronic kidney disease: Secondary | ICD-10-CM | POA: Diagnosis not present

## 2020-03-28 DIAGNOSIS — N1832 Chronic kidney disease, stage 3b: Secondary | ICD-10-CM | POA: Diagnosis not present

## 2020-03-28 DIAGNOSIS — R05 Cough: Secondary | ICD-10-CM | POA: Diagnosis not present

## 2020-03-28 DIAGNOSIS — I10 Essential (primary) hypertension: Secondary | ICD-10-CM | POA: Diagnosis not present

## 2020-03-28 DIAGNOSIS — D631 Anemia in chronic kidney disease: Secondary | ICD-10-CM | POA: Diagnosis not present

## 2020-03-28 DIAGNOSIS — S2242XD Multiple fractures of ribs, left side, subsequent encounter for fracture with routine healing: Secondary | ICD-10-CM | POA: Diagnosis not present

## 2020-03-28 DIAGNOSIS — S32029D Unspecified fracture of second lumbar vertebra, subsequent encounter for fracture with routine healing: Secondary | ICD-10-CM | POA: Diagnosis not present

## 2020-03-28 DIAGNOSIS — S32019D Unspecified fracture of first lumbar vertebra, subsequent encounter for fracture with routine healing: Secondary | ICD-10-CM | POA: Diagnosis not present

## 2020-04-03 DIAGNOSIS — S2242XD Multiple fractures of ribs, left side, subsequent encounter for fracture with routine healing: Secondary | ICD-10-CM | POA: Diagnosis not present

## 2020-04-03 DIAGNOSIS — I129 Hypertensive chronic kidney disease with stage 1 through stage 4 chronic kidney disease, or unspecified chronic kidney disease: Secondary | ICD-10-CM | POA: Diagnosis not present

## 2020-04-03 DIAGNOSIS — S32019D Unspecified fracture of first lumbar vertebra, subsequent encounter for fracture with routine healing: Secondary | ICD-10-CM | POA: Diagnosis not present

## 2020-04-03 DIAGNOSIS — N1832 Chronic kidney disease, stage 3b: Secondary | ICD-10-CM | POA: Diagnosis not present

## 2020-04-03 DIAGNOSIS — S32029D Unspecified fracture of second lumbar vertebra, subsequent encounter for fracture with routine healing: Secondary | ICD-10-CM | POA: Diagnosis not present

## 2020-04-03 DIAGNOSIS — D631 Anemia in chronic kidney disease: Secondary | ICD-10-CM | POA: Diagnosis not present

## 2020-04-04 DIAGNOSIS — D631 Anemia in chronic kidney disease: Secondary | ICD-10-CM | POA: Diagnosis not present

## 2020-04-04 DIAGNOSIS — N1832 Chronic kidney disease, stage 3b: Secondary | ICD-10-CM | POA: Diagnosis not present

## 2020-04-04 DIAGNOSIS — S32029D Unspecified fracture of second lumbar vertebra, subsequent encounter for fracture with routine healing: Secondary | ICD-10-CM | POA: Diagnosis not present

## 2020-04-04 DIAGNOSIS — S32019D Unspecified fracture of first lumbar vertebra, subsequent encounter for fracture with routine healing: Secondary | ICD-10-CM | POA: Diagnosis not present

## 2020-04-04 DIAGNOSIS — S2242XD Multiple fractures of ribs, left side, subsequent encounter for fracture with routine healing: Secondary | ICD-10-CM | POA: Diagnosis not present

## 2020-04-04 DIAGNOSIS — I129 Hypertensive chronic kidney disease with stage 1 through stage 4 chronic kidney disease, or unspecified chronic kidney disease: Secondary | ICD-10-CM | POA: Diagnosis not present

## 2020-04-08 DIAGNOSIS — I129 Hypertensive chronic kidney disease with stage 1 through stage 4 chronic kidney disease, or unspecified chronic kidney disease: Secondary | ICD-10-CM | POA: Diagnosis not present

## 2020-04-08 DIAGNOSIS — S32029D Unspecified fracture of second lumbar vertebra, subsequent encounter for fracture with routine healing: Secondary | ICD-10-CM | POA: Diagnosis not present

## 2020-04-08 DIAGNOSIS — D631 Anemia in chronic kidney disease: Secondary | ICD-10-CM | POA: Diagnosis not present

## 2020-04-08 DIAGNOSIS — S32019D Unspecified fracture of first lumbar vertebra, subsequent encounter for fracture with routine healing: Secondary | ICD-10-CM | POA: Diagnosis not present

## 2020-04-08 DIAGNOSIS — S2242XD Multiple fractures of ribs, left side, subsequent encounter for fracture with routine healing: Secondary | ICD-10-CM | POA: Diagnosis not present

## 2020-04-08 DIAGNOSIS — N1832 Chronic kidney disease, stage 3b: Secondary | ICD-10-CM | POA: Diagnosis not present

## 2020-04-11 DIAGNOSIS — S32019D Unspecified fracture of first lumbar vertebra, subsequent encounter for fracture with routine healing: Secondary | ICD-10-CM | POA: Diagnosis not present

## 2020-04-11 DIAGNOSIS — N1832 Chronic kidney disease, stage 3b: Secondary | ICD-10-CM | POA: Diagnosis not present

## 2020-04-11 DIAGNOSIS — S32029D Unspecified fracture of second lumbar vertebra, subsequent encounter for fracture with routine healing: Secondary | ICD-10-CM | POA: Diagnosis not present

## 2020-04-11 DIAGNOSIS — S2242XD Multiple fractures of ribs, left side, subsequent encounter for fracture with routine healing: Secondary | ICD-10-CM | POA: Diagnosis not present

## 2020-04-11 DIAGNOSIS — D631 Anemia in chronic kidney disease: Secondary | ICD-10-CM | POA: Diagnosis not present

## 2020-04-11 DIAGNOSIS — I129 Hypertensive chronic kidney disease with stage 1 through stage 4 chronic kidney disease, or unspecified chronic kidney disease: Secondary | ICD-10-CM | POA: Diagnosis not present

## 2020-04-15 DIAGNOSIS — I129 Hypertensive chronic kidney disease with stage 1 through stage 4 chronic kidney disease, or unspecified chronic kidney disease: Secondary | ICD-10-CM | POA: Diagnosis not present

## 2020-04-15 DIAGNOSIS — S32029D Unspecified fracture of second lumbar vertebra, subsequent encounter for fracture with routine healing: Secondary | ICD-10-CM | POA: Diagnosis not present

## 2020-04-15 DIAGNOSIS — N1832 Chronic kidney disease, stage 3b: Secondary | ICD-10-CM | POA: Diagnosis not present

## 2020-04-15 DIAGNOSIS — S32019D Unspecified fracture of first lumbar vertebra, subsequent encounter for fracture with routine healing: Secondary | ICD-10-CM | POA: Diagnosis not present

## 2020-04-15 DIAGNOSIS — S2242XD Multiple fractures of ribs, left side, subsequent encounter for fracture with routine healing: Secondary | ICD-10-CM | POA: Diagnosis not present

## 2020-04-15 DIAGNOSIS — D631 Anemia in chronic kidney disease: Secondary | ICD-10-CM | POA: Diagnosis not present

## 2020-04-18 DIAGNOSIS — D631 Anemia in chronic kidney disease: Secondary | ICD-10-CM | POA: Diagnosis not present

## 2020-04-18 DIAGNOSIS — S32019D Unspecified fracture of first lumbar vertebra, subsequent encounter for fracture with routine healing: Secondary | ICD-10-CM | POA: Diagnosis not present

## 2020-04-18 DIAGNOSIS — S2242XD Multiple fractures of ribs, left side, subsequent encounter for fracture with routine healing: Secondary | ICD-10-CM | POA: Diagnosis not present

## 2020-04-18 DIAGNOSIS — N1832 Chronic kidney disease, stage 3b: Secondary | ICD-10-CM | POA: Diagnosis not present

## 2020-04-18 DIAGNOSIS — S32029D Unspecified fracture of second lumbar vertebra, subsequent encounter for fracture with routine healing: Secondary | ICD-10-CM | POA: Diagnosis not present

## 2020-04-18 DIAGNOSIS — I129 Hypertensive chronic kidney disease with stage 1 through stage 4 chronic kidney disease, or unspecified chronic kidney disease: Secondary | ICD-10-CM | POA: Diagnosis not present

## 2020-04-19 ENCOUNTER — Other Ambulatory Visit: Payer: Self-pay | Admitting: Oncology

## 2020-04-19 DIAGNOSIS — H9193 Unspecified hearing loss, bilateral: Secondary | ICD-10-CM | POA: Diagnosis not present

## 2020-04-19 DIAGNOSIS — R32 Unspecified urinary incontinence: Secondary | ICD-10-CM | POA: Diagnosis not present

## 2020-04-19 DIAGNOSIS — D631 Anemia in chronic kidney disease: Secondary | ICD-10-CM | POA: Diagnosis not present

## 2020-04-19 DIAGNOSIS — G20 Parkinson's disease: Secondary | ICD-10-CM | POA: Diagnosis not present

## 2020-04-19 DIAGNOSIS — F329 Major depressive disorder, single episode, unspecified: Secondary | ICD-10-CM | POA: Diagnosis not present

## 2020-04-19 DIAGNOSIS — I129 Hypertensive chronic kidney disease with stage 1 through stage 4 chronic kidney disease, or unspecified chronic kidney disease: Secondary | ICD-10-CM | POA: Diagnosis not present

## 2020-04-19 DIAGNOSIS — R339 Retention of urine, unspecified: Secondary | ICD-10-CM | POA: Diagnosis not present

## 2020-04-19 DIAGNOSIS — I350 Nonrheumatic aortic (valve) stenosis: Secondary | ICD-10-CM | POA: Diagnosis not present

## 2020-04-19 DIAGNOSIS — Z8572 Personal history of non-Hodgkin lymphomas: Secondary | ICD-10-CM | POA: Diagnosis not present

## 2020-04-19 DIAGNOSIS — F0280 Dementia in other diseases classified elsewhere without behavioral disturbance: Secondary | ICD-10-CM | POA: Diagnosis not present

## 2020-04-19 DIAGNOSIS — S32019D Unspecified fracture of first lumbar vertebra, subsequent encounter for fracture with routine healing: Secondary | ICD-10-CM | POA: Diagnosis not present

## 2020-04-19 DIAGNOSIS — Z9013 Acquired absence of bilateral breasts and nipples: Secondary | ICD-10-CM | POA: Diagnosis not present

## 2020-04-19 DIAGNOSIS — Z853 Personal history of malignant neoplasm of breast: Secondary | ICD-10-CM | POA: Diagnosis not present

## 2020-04-19 DIAGNOSIS — S32029D Unspecified fracture of second lumbar vertebra, subsequent encounter for fracture with routine healing: Secondary | ICD-10-CM | POA: Diagnosis not present

## 2020-04-19 DIAGNOSIS — S2242XD Multiple fractures of ribs, left side, subsequent encounter for fracture with routine healing: Secondary | ICD-10-CM | POA: Diagnosis not present

## 2020-04-19 DIAGNOSIS — J45909 Unspecified asthma, uncomplicated: Secondary | ICD-10-CM | POA: Diagnosis not present

## 2020-04-19 DIAGNOSIS — G9341 Metabolic encephalopathy: Secondary | ICD-10-CM | POA: Diagnosis not present

## 2020-04-19 DIAGNOSIS — K219 Gastro-esophageal reflux disease without esophagitis: Secondary | ICD-10-CM | POA: Diagnosis not present

## 2020-04-19 DIAGNOSIS — R1310 Dysphagia, unspecified: Secondary | ICD-10-CM | POA: Diagnosis not present

## 2020-04-19 DIAGNOSIS — N1832 Chronic kidney disease, stage 3b: Secondary | ICD-10-CM | POA: Diagnosis not present

## 2020-04-22 DIAGNOSIS — S32019D Unspecified fracture of first lumbar vertebra, subsequent encounter for fracture with routine healing: Secondary | ICD-10-CM | POA: Diagnosis not present

## 2020-04-22 DIAGNOSIS — D631 Anemia in chronic kidney disease: Secondary | ICD-10-CM | POA: Diagnosis not present

## 2020-04-22 DIAGNOSIS — S32029D Unspecified fracture of second lumbar vertebra, subsequent encounter for fracture with routine healing: Secondary | ICD-10-CM | POA: Diagnosis not present

## 2020-04-22 DIAGNOSIS — I129 Hypertensive chronic kidney disease with stage 1 through stage 4 chronic kidney disease, or unspecified chronic kidney disease: Secondary | ICD-10-CM | POA: Diagnosis not present

## 2020-04-22 DIAGNOSIS — S2242XD Multiple fractures of ribs, left side, subsequent encounter for fracture with routine healing: Secondary | ICD-10-CM | POA: Diagnosis not present

## 2020-04-22 DIAGNOSIS — N1832 Chronic kidney disease, stage 3b: Secondary | ICD-10-CM | POA: Diagnosis not present

## 2020-04-24 ENCOUNTER — Other Ambulatory Visit: Payer: Self-pay | Admitting: Oncology

## 2020-04-25 DIAGNOSIS — S32019D Unspecified fracture of first lumbar vertebra, subsequent encounter for fracture with routine healing: Secondary | ICD-10-CM | POA: Diagnosis not present

## 2020-04-25 DIAGNOSIS — D631 Anemia in chronic kidney disease: Secondary | ICD-10-CM | POA: Diagnosis not present

## 2020-04-25 DIAGNOSIS — I129 Hypertensive chronic kidney disease with stage 1 through stage 4 chronic kidney disease, or unspecified chronic kidney disease: Secondary | ICD-10-CM | POA: Diagnosis not present

## 2020-04-25 DIAGNOSIS — S2242XD Multiple fractures of ribs, left side, subsequent encounter for fracture with routine healing: Secondary | ICD-10-CM | POA: Diagnosis not present

## 2020-04-25 DIAGNOSIS — S32029D Unspecified fracture of second lumbar vertebra, subsequent encounter for fracture with routine healing: Secondary | ICD-10-CM | POA: Diagnosis not present

## 2020-04-25 DIAGNOSIS — N1832 Chronic kidney disease, stage 3b: Secondary | ICD-10-CM | POA: Diagnosis not present

## 2020-04-29 DIAGNOSIS — D631 Anemia in chronic kidney disease: Secondary | ICD-10-CM | POA: Diagnosis not present

## 2020-04-29 DIAGNOSIS — S32029D Unspecified fracture of second lumbar vertebra, subsequent encounter for fracture with routine healing: Secondary | ICD-10-CM | POA: Diagnosis not present

## 2020-04-29 DIAGNOSIS — S2242XD Multiple fractures of ribs, left side, subsequent encounter for fracture with routine healing: Secondary | ICD-10-CM | POA: Diagnosis not present

## 2020-04-29 DIAGNOSIS — S32019D Unspecified fracture of first lumbar vertebra, subsequent encounter for fracture with routine healing: Secondary | ICD-10-CM | POA: Diagnosis not present

## 2020-04-29 DIAGNOSIS — I129 Hypertensive chronic kidney disease with stage 1 through stage 4 chronic kidney disease, or unspecified chronic kidney disease: Secondary | ICD-10-CM | POA: Diagnosis not present

## 2020-04-29 DIAGNOSIS — N1832 Chronic kidney disease, stage 3b: Secondary | ICD-10-CM | POA: Diagnosis not present

## 2020-05-01 DIAGNOSIS — D631 Anemia in chronic kidney disease: Secondary | ICD-10-CM | POA: Diagnosis not present

## 2020-05-01 DIAGNOSIS — N1832 Chronic kidney disease, stage 3b: Secondary | ICD-10-CM | POA: Diagnosis not present

## 2020-05-01 DIAGNOSIS — S2242XD Multiple fractures of ribs, left side, subsequent encounter for fracture with routine healing: Secondary | ICD-10-CM | POA: Diagnosis not present

## 2020-05-01 DIAGNOSIS — S32019D Unspecified fracture of first lumbar vertebra, subsequent encounter for fracture with routine healing: Secondary | ICD-10-CM | POA: Diagnosis not present

## 2020-05-01 DIAGNOSIS — S32029D Unspecified fracture of second lumbar vertebra, subsequent encounter for fracture with routine healing: Secondary | ICD-10-CM | POA: Diagnosis not present

## 2020-05-01 DIAGNOSIS — I129 Hypertensive chronic kidney disease with stage 1 through stage 4 chronic kidney disease, or unspecified chronic kidney disease: Secondary | ICD-10-CM | POA: Diagnosis not present

## 2020-05-02 DIAGNOSIS — S32019D Unspecified fracture of first lumbar vertebra, subsequent encounter for fracture with routine healing: Secondary | ICD-10-CM | POA: Diagnosis not present

## 2020-05-02 DIAGNOSIS — I129 Hypertensive chronic kidney disease with stage 1 through stage 4 chronic kidney disease, or unspecified chronic kidney disease: Secondary | ICD-10-CM | POA: Diagnosis not present

## 2020-05-02 DIAGNOSIS — D631 Anemia in chronic kidney disease: Secondary | ICD-10-CM | POA: Diagnosis not present

## 2020-05-02 DIAGNOSIS — S2242XD Multiple fractures of ribs, left side, subsequent encounter for fracture with routine healing: Secondary | ICD-10-CM | POA: Diagnosis not present

## 2020-05-02 DIAGNOSIS — N1832 Chronic kidney disease, stage 3b: Secondary | ICD-10-CM | POA: Diagnosis not present

## 2020-05-02 DIAGNOSIS — S32029D Unspecified fracture of second lumbar vertebra, subsequent encounter for fracture with routine healing: Secondary | ICD-10-CM | POA: Diagnosis not present

## 2020-05-06 DIAGNOSIS — N1832 Chronic kidney disease, stage 3b: Secondary | ICD-10-CM | POA: Diagnosis not present

## 2020-05-06 DIAGNOSIS — D631 Anemia in chronic kidney disease: Secondary | ICD-10-CM | POA: Diagnosis not present

## 2020-05-06 DIAGNOSIS — S2242XD Multiple fractures of ribs, left side, subsequent encounter for fracture with routine healing: Secondary | ICD-10-CM | POA: Diagnosis not present

## 2020-05-06 DIAGNOSIS — S32029D Unspecified fracture of second lumbar vertebra, subsequent encounter for fracture with routine healing: Secondary | ICD-10-CM | POA: Diagnosis not present

## 2020-05-06 DIAGNOSIS — I129 Hypertensive chronic kidney disease with stage 1 through stage 4 chronic kidney disease, or unspecified chronic kidney disease: Secondary | ICD-10-CM | POA: Diagnosis not present

## 2020-05-06 DIAGNOSIS — S32019D Unspecified fracture of first lumbar vertebra, subsequent encounter for fracture with routine healing: Secondary | ICD-10-CM | POA: Diagnosis not present

## 2020-05-09 DIAGNOSIS — D631 Anemia in chronic kidney disease: Secondary | ICD-10-CM | POA: Diagnosis not present

## 2020-05-09 DIAGNOSIS — S32029D Unspecified fracture of second lumbar vertebra, subsequent encounter for fracture with routine healing: Secondary | ICD-10-CM | POA: Diagnosis not present

## 2020-05-09 DIAGNOSIS — N1832 Chronic kidney disease, stage 3b: Secondary | ICD-10-CM | POA: Diagnosis not present

## 2020-05-09 DIAGNOSIS — S32019D Unspecified fracture of first lumbar vertebra, subsequent encounter for fracture with routine healing: Secondary | ICD-10-CM | POA: Diagnosis not present

## 2020-05-09 DIAGNOSIS — S2242XD Multiple fractures of ribs, left side, subsequent encounter for fracture with routine healing: Secondary | ICD-10-CM | POA: Diagnosis not present

## 2020-05-09 DIAGNOSIS — I129 Hypertensive chronic kidney disease with stage 1 through stage 4 chronic kidney disease, or unspecified chronic kidney disease: Secondary | ICD-10-CM | POA: Diagnosis not present

## 2020-05-14 DIAGNOSIS — D631 Anemia in chronic kidney disease: Secondary | ICD-10-CM | POA: Diagnosis not present

## 2020-05-14 DIAGNOSIS — S32019D Unspecified fracture of first lumbar vertebra, subsequent encounter for fracture with routine healing: Secondary | ICD-10-CM | POA: Diagnosis not present

## 2020-05-14 DIAGNOSIS — S2242XD Multiple fractures of ribs, left side, subsequent encounter for fracture with routine healing: Secondary | ICD-10-CM | POA: Diagnosis not present

## 2020-05-14 DIAGNOSIS — N1832 Chronic kidney disease, stage 3b: Secondary | ICD-10-CM | POA: Diagnosis not present

## 2020-05-14 DIAGNOSIS — S32029D Unspecified fracture of second lumbar vertebra, subsequent encounter for fracture with routine healing: Secondary | ICD-10-CM | POA: Diagnosis not present

## 2020-05-14 DIAGNOSIS — I129 Hypertensive chronic kidney disease with stage 1 through stage 4 chronic kidney disease, or unspecified chronic kidney disease: Secondary | ICD-10-CM | POA: Diagnosis not present

## 2020-05-19 DIAGNOSIS — R339 Retention of urine, unspecified: Secondary | ICD-10-CM | POA: Diagnosis not present

## 2020-05-19 DIAGNOSIS — G20 Parkinson's disease: Secondary | ICD-10-CM | POA: Diagnosis not present

## 2020-05-19 DIAGNOSIS — F329 Major depressive disorder, single episode, unspecified: Secondary | ICD-10-CM | POA: Diagnosis not present

## 2020-05-19 DIAGNOSIS — S32019D Unspecified fracture of first lumbar vertebra, subsequent encounter for fracture with routine healing: Secondary | ICD-10-CM | POA: Diagnosis not present

## 2020-05-19 DIAGNOSIS — N1832 Chronic kidney disease, stage 3b: Secondary | ICD-10-CM | POA: Diagnosis not present

## 2020-05-19 DIAGNOSIS — K219 Gastro-esophageal reflux disease without esophagitis: Secondary | ICD-10-CM | POA: Diagnosis not present

## 2020-05-19 DIAGNOSIS — R32 Unspecified urinary incontinence: Secondary | ICD-10-CM | POA: Diagnosis not present

## 2020-05-19 DIAGNOSIS — Z8572 Personal history of non-Hodgkin lymphomas: Secondary | ICD-10-CM | POA: Diagnosis not present

## 2020-05-19 DIAGNOSIS — J45909 Unspecified asthma, uncomplicated: Secondary | ICD-10-CM | POA: Diagnosis not present

## 2020-05-19 DIAGNOSIS — G9341 Metabolic encephalopathy: Secondary | ICD-10-CM | POA: Diagnosis not present

## 2020-05-19 DIAGNOSIS — S2242XD Multiple fractures of ribs, left side, subsequent encounter for fracture with routine healing: Secondary | ICD-10-CM | POA: Diagnosis not present

## 2020-05-19 DIAGNOSIS — D631 Anemia in chronic kidney disease: Secondary | ICD-10-CM | POA: Diagnosis not present

## 2020-05-19 DIAGNOSIS — Z9013 Acquired absence of bilateral breasts and nipples: Secondary | ICD-10-CM | POA: Diagnosis not present

## 2020-05-19 DIAGNOSIS — H9193 Unspecified hearing loss, bilateral: Secondary | ICD-10-CM | POA: Diagnosis not present

## 2020-05-19 DIAGNOSIS — R1310 Dysphagia, unspecified: Secondary | ICD-10-CM | POA: Diagnosis not present

## 2020-05-19 DIAGNOSIS — I350 Nonrheumatic aortic (valve) stenosis: Secondary | ICD-10-CM | POA: Diagnosis not present

## 2020-05-19 DIAGNOSIS — F0280 Dementia in other diseases classified elsewhere without behavioral disturbance: Secondary | ICD-10-CM | POA: Diagnosis not present

## 2020-05-19 DIAGNOSIS — Z853 Personal history of malignant neoplasm of breast: Secondary | ICD-10-CM | POA: Diagnosis not present

## 2020-05-19 DIAGNOSIS — I129 Hypertensive chronic kidney disease with stage 1 through stage 4 chronic kidney disease, or unspecified chronic kidney disease: Secondary | ICD-10-CM | POA: Diagnosis not present

## 2020-05-19 DIAGNOSIS — S32029D Unspecified fracture of second lumbar vertebra, subsequent encounter for fracture with routine healing: Secondary | ICD-10-CM | POA: Diagnosis not present

## 2020-05-20 DIAGNOSIS — S2242XD Multiple fractures of ribs, left side, subsequent encounter for fracture with routine healing: Secondary | ICD-10-CM | POA: Diagnosis not present

## 2020-05-20 DIAGNOSIS — I129 Hypertensive chronic kidney disease with stage 1 through stage 4 chronic kidney disease, or unspecified chronic kidney disease: Secondary | ICD-10-CM | POA: Diagnosis not present

## 2020-05-20 DIAGNOSIS — D631 Anemia in chronic kidney disease: Secondary | ICD-10-CM | POA: Diagnosis not present

## 2020-05-20 DIAGNOSIS — S32019D Unspecified fracture of first lumbar vertebra, subsequent encounter for fracture with routine healing: Secondary | ICD-10-CM | POA: Diagnosis not present

## 2020-05-20 DIAGNOSIS — N1832 Chronic kidney disease, stage 3b: Secondary | ICD-10-CM | POA: Diagnosis not present

## 2020-05-20 DIAGNOSIS — S32029D Unspecified fracture of second lumbar vertebra, subsequent encounter for fracture with routine healing: Secondary | ICD-10-CM | POA: Diagnosis not present

## 2020-05-23 DIAGNOSIS — S32029D Unspecified fracture of second lumbar vertebra, subsequent encounter for fracture with routine healing: Secondary | ICD-10-CM | POA: Diagnosis not present

## 2020-05-23 DIAGNOSIS — S2242XD Multiple fractures of ribs, left side, subsequent encounter for fracture with routine healing: Secondary | ICD-10-CM | POA: Diagnosis not present

## 2020-05-23 DIAGNOSIS — S32019D Unspecified fracture of first lumbar vertebra, subsequent encounter for fracture with routine healing: Secondary | ICD-10-CM | POA: Diagnosis not present

## 2020-05-23 DIAGNOSIS — I129 Hypertensive chronic kidney disease with stage 1 through stage 4 chronic kidney disease, or unspecified chronic kidney disease: Secondary | ICD-10-CM | POA: Diagnosis not present

## 2020-05-23 DIAGNOSIS — D631 Anemia in chronic kidney disease: Secondary | ICD-10-CM | POA: Diagnosis not present

## 2020-05-23 DIAGNOSIS — N1832 Chronic kidney disease, stage 3b: Secondary | ICD-10-CM | POA: Diagnosis not present

## 2020-05-27 ENCOUNTER — Telehealth: Payer: Self-pay | Admitting: Oncology

## 2020-05-27 ENCOUNTER — Inpatient Hospital Stay: Payer: Medicare Other | Attending: Oncology | Admitting: Oncology

## 2020-05-27 ENCOUNTER — Other Ambulatory Visit: Payer: Self-pay

## 2020-05-27 ENCOUNTER — Inpatient Hospital Stay: Payer: Medicare Other

## 2020-05-27 VITALS — BP 141/69 | HR 71 | Temp 97.5°F | Resp 16 | Ht 60.0 in | Wt 104.5 lb

## 2020-05-27 DIAGNOSIS — Z170 Estrogen receptor positive status [ER+]: Secondary | ICD-10-CM | POA: Diagnosis not present

## 2020-05-27 DIAGNOSIS — Z9221 Personal history of antineoplastic chemotherapy: Secondary | ICD-10-CM | POA: Diagnosis not present

## 2020-05-27 DIAGNOSIS — F0280 Dementia in other diseases classified elsewhere without behavioral disturbance: Secondary | ICD-10-CM | POA: Diagnosis not present

## 2020-05-27 DIAGNOSIS — I10 Essential (primary) hypertension: Secondary | ICD-10-CM | POA: Diagnosis not present

## 2020-05-27 DIAGNOSIS — S2242XD Multiple fractures of ribs, left side, subsequent encounter for fracture with routine healing: Secondary | ICD-10-CM | POA: Diagnosis not present

## 2020-05-27 DIAGNOSIS — S32029D Unspecified fracture of second lumbar vertebra, subsequent encounter for fracture with routine healing: Secondary | ICD-10-CM | POA: Diagnosis not present

## 2020-05-27 DIAGNOSIS — Z853 Personal history of malignant neoplasm of breast: Secondary | ICD-10-CM | POA: Insufficient documentation

## 2020-05-27 DIAGNOSIS — G20 Parkinson's disease: Secondary | ICD-10-CM | POA: Diagnosis not present

## 2020-05-27 DIAGNOSIS — I129 Hypertensive chronic kidney disease with stage 1 through stage 4 chronic kidney disease, or unspecified chronic kidney disease: Secondary | ICD-10-CM | POA: Diagnosis not present

## 2020-05-27 DIAGNOSIS — D631 Anemia in chronic kidney disease: Secondary | ICD-10-CM | POA: Diagnosis not present

## 2020-05-27 DIAGNOSIS — C8590 Non-Hodgkin lymphoma, unspecified, unspecified site: Secondary | ICD-10-CM | POA: Diagnosis not present

## 2020-05-27 DIAGNOSIS — Z8572 Personal history of non-Hodgkin lymphomas: Secondary | ICD-10-CM | POA: Insufficient documentation

## 2020-05-27 DIAGNOSIS — S32019D Unspecified fracture of first lumbar vertebra, subsequent encounter for fracture with routine healing: Secondary | ICD-10-CM | POA: Diagnosis not present

## 2020-05-27 DIAGNOSIS — N1832 Chronic kidney disease, stage 3b: Secondary | ICD-10-CM | POA: Diagnosis not present

## 2020-05-27 DIAGNOSIS — Z79899 Other long term (current) drug therapy: Secondary | ICD-10-CM | POA: Insufficient documentation

## 2020-05-27 LAB — CBC WITH DIFFERENTIAL (CANCER CENTER ONLY)
Abs Immature Granulocytes: 0.03 10*3/uL (ref 0.00–0.07)
Basophils Absolute: 0 10*3/uL (ref 0.0–0.1)
Basophils Relative: 1 %
Eosinophils Absolute: 0.2 10*3/uL (ref 0.0–0.5)
Eosinophils Relative: 5 %
HCT: 36.3 % (ref 36.0–46.0)
Hemoglobin: 11.5 g/dL — ABNORMAL LOW (ref 12.0–15.0)
Immature Granulocytes: 1 %
Lymphocytes Relative: 21 %
Lymphs Abs: 0.8 10*3/uL (ref 0.7–4.0)
MCH: 25.6 pg — ABNORMAL LOW (ref 26.0–34.0)
MCHC: 31.7 g/dL (ref 30.0–36.0)
MCV: 80.7 fL (ref 80.0–100.0)
Monocytes Absolute: 0.2 10*3/uL (ref 0.1–1.0)
Monocytes Relative: 5 %
Neutro Abs: 2.7 10*3/uL (ref 1.7–7.7)
Neutrophils Relative %: 67 %
Platelet Count: 151 10*3/uL (ref 150–400)
RBC: 4.5 MIL/uL (ref 3.87–5.11)
RDW: 15.3 % (ref 11.5–15.5)
WBC Count: 4 10*3/uL (ref 4.0–10.5)
nRBC: 0 % (ref 0.0–0.2)

## 2020-05-27 LAB — CMP (CANCER CENTER ONLY)
ALT: <6 U/L (ref 0–44)
AST: 16 U/L (ref 15–41)
Albumin: 3.4 g/dL — ABNORMAL LOW (ref 3.5–5.0)
Alkaline Phosphatase: 113 U/L (ref 38–126)
Anion gap: 6 (ref 5–15)
BUN: 17 mg/dL (ref 8–23)
CO2: 30 mmol/L (ref 22–32)
Calcium: 9.3 mg/dL (ref 8.9–10.3)
Chloride: 105 mmol/L (ref 98–111)
Creatinine: 0.88 mg/dL (ref 0.44–1.00)
GFR, Estimated: >60 mL/min (ref >60)
Glucose, Bld: 121 mg/dL — ABNORMAL HIGH (ref 70–99)
Potassium: 3.8 mmol/L (ref 3.5–5.1)
Sodium: 141 mmol/L (ref 135–145)
Total Bilirubin: 0.4 mg/dL (ref 0.3–1.2)
Total Protein: 6.5 g/dL (ref 6.5–8.1)

## 2020-05-27 NOTE — Telephone Encounter (Signed)
Scheduled per 11/2 los. Printed avs and calendar for pt.

## 2020-05-27 NOTE — Progress Notes (Deleted)
  [place] OFFICE PROGRESS NOTE   Diagnosis:   INTERVAL HISTORY:   ***  Objective:  Vital signs in last 24 hours:  Blood pressure (!) 141/69, pulse 71, temperature (!) 97.5 F (36.4 C), temperature source Tympanic, resp. rate 16, height 5' (1.524 m), weight 104 lb 8 oz (47.4 kg), SpO2 98 %.    HEENT: *** Lymphatics: *** Resp: *** Cardio: *** GI: *** Vascular: *** Neuro:***  Skin:***   Portacath/PICC-without erythema  Lab Results:  Lab Results  Component Value Date   WBC 4.0 05/27/2020   HGB 11.5 (L) 05/27/2020   HCT 36.3 05/27/2020   MCV 80.7 05/27/2020   PLT 151 05/27/2020   NEUTROABS 2.7 05/27/2020    CMP  Lab Results  Component Value Date   NA 141 05/27/2020   K 3.8 05/27/2020   CL 105 05/27/2020   CO2 30 05/27/2020   GLUCOSE 121 (H) 05/27/2020   BUN 17 05/27/2020   CREATININE 0.88 05/27/2020   CALCIUM 9.3 05/27/2020   PROT 6.5 05/27/2020   ALBUMIN 3.4 (L) 05/27/2020   AST 16 05/27/2020   ALT <6 05/27/2020   ALKPHOS 113 05/27/2020   BILITOT 0.4 05/27/2020   GFRNONAA >60 05/27/2020   GFRAA >60 02/20/2020    Medications: I have reviewed the patient's current medications.   Assessment/Plan:  1. Non-Hodgkin lymphoma treated with fludarabine/rituximab, last in July of 2009.  1. Restaging CT 11/24/2010 revealed evidence of progressive lymphoma in the abdomen and pelvis.  2. Initiation of salvage therapy with bendamustine/rituximab 12/04/2010. She completed 3 cycles.  3. Restaging CT 03/03/2011 revealed stable retroperitoneal soft tissue and a marked decrease in the soft tissue thickening associated with dilated loops of small bowel. 2. History of ITP.  3. History of Herpes zoster-maintained on prophylactic acyclovir. 4. History of anemia secondary to non-Hodgkin lymphoma, chemotherapy, and a history of autoimmune hemolysis.  5. Hypertension. 6. Right-hand tremor/ataxia-followed by neurology, now taking Sinemet, diagnosed  with Parkinson's disease 7. Hearing loss-status post placement of an implanted hearing device by Dr. Cresenciano Lick. 8. Bilateral breast cancer, right breast cancer-grade 1, T1 N0, ER/PR positive, and HER2 negative. Left breast cancer, synchronous grade 2, T2 N0, ER/PR positive, and HER2 negative. She began adjuvant Arimidex on 09/15/2010,completed February 2017 9. Inflammatory changes of both lungs on a CT of the chest 11/24/2010-progressive on the restaging CT 03/03/2011, status post a bronchoscopy 03/17/2011 with no evidence of granulomata or tumor. 10. 11 mm spiculated lesion in the lingula on a CT 11/24/2010-less distinct on the CT 03/03/2011. 11. Upper respiratory infection while visiting her son in [place], June 2013, resolved after treatment with Levaquin/steroids 12. Upper respiratory infection,? Pneumonia summer 2014-chest x-ray 02/17/2013 with bilateral infiltrates 13. CT chest abdomen and pelvis on 11/29/2014 with no evidence of active lymphoma or metastatic breast cancer. Previously demonstrated retroperitoneal process appeared improved with probable residual fibrosis. No discrete adenopathy. Interval near complete resolution of patchy airspace opacities in both lungs. 14. Mild swelling and pain at the right lower leg 08/06/2016-negative for acute deep vein thrombosis, possible chronic thrombus in the right popliteal vein 15. Admission July 2018 with GI bleeding, no source for blood loss found on an upper endoscopy 16. Parkinson's disease 17. Dementia 18. Fall with right humerus fracture 05/04/2019    Disposition: ***  Betsy Coder, MD  05/27/2020  12:37 PM
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
## 2020-05-30 DIAGNOSIS — I129 Hypertensive chronic kidney disease with stage 1 through stage 4 chronic kidney disease, or unspecified chronic kidney disease: Secondary | ICD-10-CM | POA: Diagnosis not present

## 2020-05-30 DIAGNOSIS — S2242XD Multiple fractures of ribs, left side, subsequent encounter for fracture with routine healing: Secondary | ICD-10-CM | POA: Diagnosis not present

## 2020-05-30 DIAGNOSIS — D631 Anemia in chronic kidney disease: Secondary | ICD-10-CM | POA: Diagnosis not present

## 2020-05-30 DIAGNOSIS — S32019D Unspecified fracture of first lumbar vertebra, subsequent encounter for fracture with routine healing: Secondary | ICD-10-CM | POA: Diagnosis not present

## 2020-05-30 DIAGNOSIS — S32029D Unspecified fracture of second lumbar vertebra, subsequent encounter for fracture with routine healing: Secondary | ICD-10-CM | POA: Diagnosis not present

## 2020-05-30 DIAGNOSIS — N1832 Chronic kidney disease, stage 3b: Secondary | ICD-10-CM | POA: Diagnosis not present

## 2020-06-03 DIAGNOSIS — D631 Anemia in chronic kidney disease: Secondary | ICD-10-CM | POA: Diagnosis not present

## 2020-06-03 DIAGNOSIS — I129 Hypertensive chronic kidney disease with stage 1 through stage 4 chronic kidney disease, or unspecified chronic kidney disease: Secondary | ICD-10-CM | POA: Diagnosis not present

## 2020-06-03 DIAGNOSIS — S32019D Unspecified fracture of first lumbar vertebra, subsequent encounter for fracture with routine healing: Secondary | ICD-10-CM | POA: Diagnosis not present

## 2020-06-03 DIAGNOSIS — S32029D Unspecified fracture of second lumbar vertebra, subsequent encounter for fracture with routine healing: Secondary | ICD-10-CM | POA: Diagnosis not present

## 2020-06-03 DIAGNOSIS — S2242XD Multiple fractures of ribs, left side, subsequent encounter for fracture with routine healing: Secondary | ICD-10-CM | POA: Diagnosis not present

## 2020-06-03 DIAGNOSIS — N1832 Chronic kidney disease, stage 3b: Secondary | ICD-10-CM | POA: Diagnosis not present

## 2020-06-06 DIAGNOSIS — S32029D Unspecified fracture of second lumbar vertebra, subsequent encounter for fracture with routine healing: Secondary | ICD-10-CM | POA: Diagnosis not present

## 2020-06-06 DIAGNOSIS — S2242XD Multiple fractures of ribs, left side, subsequent encounter for fracture with routine healing: Secondary | ICD-10-CM | POA: Diagnosis not present

## 2020-06-06 DIAGNOSIS — S32019D Unspecified fracture of first lumbar vertebra, subsequent encounter for fracture with routine healing: Secondary | ICD-10-CM | POA: Diagnosis not present

## 2020-06-06 DIAGNOSIS — I129 Hypertensive chronic kidney disease with stage 1 through stage 4 chronic kidney disease, or unspecified chronic kidney disease: Secondary | ICD-10-CM | POA: Diagnosis not present

## 2020-06-06 DIAGNOSIS — N1832 Chronic kidney disease, stage 3b: Secondary | ICD-10-CM | POA: Diagnosis not present

## 2020-06-06 DIAGNOSIS — D631 Anemia in chronic kidney disease: Secondary | ICD-10-CM | POA: Diagnosis not present

## 2020-06-11 DIAGNOSIS — R413 Other amnesia: Secondary | ICD-10-CM | POA: Diagnosis not present

## 2020-06-11 DIAGNOSIS — I129 Hypertensive chronic kidney disease with stage 1 through stage 4 chronic kidney disease, or unspecified chronic kidney disease: Secondary | ICD-10-CM | POA: Diagnosis not present

## 2020-06-11 DIAGNOSIS — S32029D Unspecified fracture of second lumbar vertebra, subsequent encounter for fracture with routine healing: Secondary | ICD-10-CM | POA: Diagnosis not present

## 2020-06-11 DIAGNOSIS — G20 Parkinson's disease: Secondary | ICD-10-CM | POA: Diagnosis not present

## 2020-06-11 DIAGNOSIS — S2242XD Multiple fractures of ribs, left side, subsequent encounter for fracture with routine healing: Secondary | ICD-10-CM | POA: Diagnosis not present

## 2020-06-11 DIAGNOSIS — F419 Anxiety disorder, unspecified: Secondary | ICD-10-CM | POA: Diagnosis not present

## 2020-06-11 DIAGNOSIS — S32019D Unspecified fracture of first lumbar vertebra, subsequent encounter for fracture with routine healing: Secondary | ICD-10-CM | POA: Diagnosis not present

## 2020-06-11 DIAGNOSIS — I10 Essential (primary) hypertension: Secondary | ICD-10-CM | POA: Diagnosis not present

## 2020-06-11 DIAGNOSIS — E7800 Pure hypercholesterolemia, unspecified: Secondary | ICD-10-CM | POA: Diagnosis not present

## 2020-06-11 DIAGNOSIS — N1832 Chronic kidney disease, stage 3b: Secondary | ICD-10-CM | POA: Diagnosis not present

## 2020-06-11 DIAGNOSIS — N183 Chronic kidney disease, stage 3 unspecified: Secondary | ICD-10-CM | POA: Diagnosis not present

## 2020-06-11 DIAGNOSIS — D631 Anemia in chronic kidney disease: Secondary | ICD-10-CM | POA: Diagnosis not present

## 2020-06-13 DIAGNOSIS — N390 Urinary tract infection, site not specified: Secondary | ICD-10-CM | POA: Diagnosis not present

## 2020-06-13 DIAGNOSIS — S2242XD Multiple fractures of ribs, left side, subsequent encounter for fracture with routine healing: Secondary | ICD-10-CM | POA: Diagnosis not present

## 2020-06-13 DIAGNOSIS — S32029D Unspecified fracture of second lumbar vertebra, subsequent encounter for fracture with routine healing: Secondary | ICD-10-CM | POA: Diagnosis not present

## 2020-06-13 DIAGNOSIS — D631 Anemia in chronic kidney disease: Secondary | ICD-10-CM | POA: Diagnosis not present

## 2020-06-13 DIAGNOSIS — S32019D Unspecified fracture of first lumbar vertebra, subsequent encounter for fracture with routine healing: Secondary | ICD-10-CM | POA: Diagnosis not present

## 2020-06-13 DIAGNOSIS — I129 Hypertensive chronic kidney disease with stage 1 through stage 4 chronic kidney disease, or unspecified chronic kidney disease: Secondary | ICD-10-CM | POA: Diagnosis not present

## 2020-06-13 DIAGNOSIS — R300 Dysuria: Secondary | ICD-10-CM | POA: Diagnosis not present

## 2020-06-13 DIAGNOSIS — N1832 Chronic kidney disease, stage 3b: Secondary | ICD-10-CM | POA: Diagnosis not present

## 2020-06-17 DIAGNOSIS — I129 Hypertensive chronic kidney disease with stage 1 through stage 4 chronic kidney disease, or unspecified chronic kidney disease: Secondary | ICD-10-CM | POA: Diagnosis not present

## 2020-06-17 DIAGNOSIS — S32029D Unspecified fracture of second lumbar vertebra, subsequent encounter for fracture with routine healing: Secondary | ICD-10-CM | POA: Diagnosis not present

## 2020-06-17 DIAGNOSIS — D631 Anemia in chronic kidney disease: Secondary | ICD-10-CM | POA: Diagnosis not present

## 2020-06-17 DIAGNOSIS — S32019D Unspecified fracture of first lumbar vertebra, subsequent encounter for fracture with routine healing: Secondary | ICD-10-CM | POA: Diagnosis not present

## 2020-06-17 DIAGNOSIS — N1832 Chronic kidney disease, stage 3b: Secondary | ICD-10-CM | POA: Diagnosis not present

## 2020-06-17 DIAGNOSIS — S2242XD Multiple fractures of ribs, left side, subsequent encounter for fracture with routine healing: Secondary | ICD-10-CM | POA: Diagnosis not present

## 2020-06-18 DIAGNOSIS — N1832 Chronic kidney disease, stage 3b: Secondary | ICD-10-CM | POA: Diagnosis not present

## 2020-06-18 DIAGNOSIS — R339 Retention of urine, unspecified: Secondary | ICD-10-CM | POA: Diagnosis not present

## 2020-06-18 DIAGNOSIS — I350 Nonrheumatic aortic (valve) stenosis: Secondary | ICD-10-CM | POA: Diagnosis not present

## 2020-06-18 DIAGNOSIS — R1310 Dysphagia, unspecified: Secondary | ICD-10-CM | POA: Diagnosis not present

## 2020-06-18 DIAGNOSIS — H9193 Unspecified hearing loss, bilateral: Secondary | ICD-10-CM | POA: Diagnosis not present

## 2020-06-18 DIAGNOSIS — D631 Anemia in chronic kidney disease: Secondary | ICD-10-CM | POA: Diagnosis not present

## 2020-06-18 DIAGNOSIS — F329 Major depressive disorder, single episode, unspecified: Secondary | ICD-10-CM | POA: Diagnosis not present

## 2020-06-18 DIAGNOSIS — F0280 Dementia in other diseases classified elsewhere without behavioral disturbance: Secondary | ICD-10-CM | POA: Diagnosis not present

## 2020-06-18 DIAGNOSIS — Z9013 Acquired absence of bilateral breasts and nipples: Secondary | ICD-10-CM | POA: Diagnosis not present

## 2020-06-18 DIAGNOSIS — Z8572 Personal history of non-Hodgkin lymphomas: Secondary | ICD-10-CM | POA: Diagnosis not present

## 2020-06-18 DIAGNOSIS — Z853 Personal history of malignant neoplasm of breast: Secondary | ICD-10-CM | POA: Diagnosis not present

## 2020-06-18 DIAGNOSIS — S32029D Unspecified fracture of second lumbar vertebra, subsequent encounter for fracture with routine healing: Secondary | ICD-10-CM | POA: Diagnosis not present

## 2020-06-18 DIAGNOSIS — G20 Parkinson's disease: Secondary | ICD-10-CM | POA: Diagnosis not present

## 2020-06-18 DIAGNOSIS — S2242XD Multiple fractures of ribs, left side, subsequent encounter for fracture with routine healing: Secondary | ICD-10-CM | POA: Diagnosis not present

## 2020-06-18 DIAGNOSIS — K219 Gastro-esophageal reflux disease without esophagitis: Secondary | ICD-10-CM | POA: Diagnosis not present

## 2020-06-18 DIAGNOSIS — G9341 Metabolic encephalopathy: Secondary | ICD-10-CM | POA: Diagnosis not present

## 2020-06-18 DIAGNOSIS — S32019D Unspecified fracture of first lumbar vertebra, subsequent encounter for fracture with routine healing: Secondary | ICD-10-CM | POA: Diagnosis not present

## 2020-06-18 DIAGNOSIS — I129 Hypertensive chronic kidney disease with stage 1 through stage 4 chronic kidney disease, or unspecified chronic kidney disease: Secondary | ICD-10-CM | POA: Diagnosis not present

## 2020-06-18 DIAGNOSIS — R32 Unspecified urinary incontinence: Secondary | ICD-10-CM | POA: Diagnosis not present

## 2020-06-18 DIAGNOSIS — J45909 Unspecified asthma, uncomplicated: Secondary | ICD-10-CM | POA: Diagnosis not present

## 2020-06-19 DIAGNOSIS — S32019D Unspecified fracture of first lumbar vertebra, subsequent encounter for fracture with routine healing: Secondary | ICD-10-CM | POA: Diagnosis not present

## 2020-06-19 DIAGNOSIS — N1832 Chronic kidney disease, stage 3b: Secondary | ICD-10-CM | POA: Diagnosis not present

## 2020-06-19 DIAGNOSIS — D631 Anemia in chronic kidney disease: Secondary | ICD-10-CM | POA: Diagnosis not present

## 2020-06-19 DIAGNOSIS — I129 Hypertensive chronic kidney disease with stage 1 through stage 4 chronic kidney disease, or unspecified chronic kidney disease: Secondary | ICD-10-CM | POA: Diagnosis not present

## 2020-06-19 DIAGNOSIS — S32029D Unspecified fracture of second lumbar vertebra, subsequent encounter for fracture with routine healing: Secondary | ICD-10-CM | POA: Diagnosis not present

## 2020-06-19 DIAGNOSIS — S2242XD Multiple fractures of ribs, left side, subsequent encounter for fracture with routine healing: Secondary | ICD-10-CM | POA: Diagnosis not present

## 2020-06-25 DIAGNOSIS — S32019D Unspecified fracture of first lumbar vertebra, subsequent encounter for fracture with routine healing: Secondary | ICD-10-CM | POA: Diagnosis not present

## 2020-06-25 DIAGNOSIS — N1832 Chronic kidney disease, stage 3b: Secondary | ICD-10-CM | POA: Diagnosis not present

## 2020-06-25 DIAGNOSIS — S32029D Unspecified fracture of second lumbar vertebra, subsequent encounter for fracture with routine healing: Secondary | ICD-10-CM | POA: Diagnosis not present

## 2020-06-25 DIAGNOSIS — I129 Hypertensive chronic kidney disease with stage 1 through stage 4 chronic kidney disease, or unspecified chronic kidney disease: Secondary | ICD-10-CM | POA: Diagnosis not present

## 2020-06-25 DIAGNOSIS — S2242XD Multiple fractures of ribs, left side, subsequent encounter for fracture with routine healing: Secondary | ICD-10-CM | POA: Diagnosis not present

## 2020-06-25 DIAGNOSIS — D631 Anemia in chronic kidney disease: Secondary | ICD-10-CM | POA: Diagnosis not present

## 2020-06-27 DIAGNOSIS — S32019D Unspecified fracture of first lumbar vertebra, subsequent encounter for fracture with routine healing: Secondary | ICD-10-CM | POA: Diagnosis not present

## 2020-06-27 DIAGNOSIS — S2242XD Multiple fractures of ribs, left side, subsequent encounter for fracture with routine healing: Secondary | ICD-10-CM | POA: Diagnosis not present

## 2020-06-27 DIAGNOSIS — N1832 Chronic kidney disease, stage 3b: Secondary | ICD-10-CM | POA: Diagnosis not present

## 2020-06-27 DIAGNOSIS — D631 Anemia in chronic kidney disease: Secondary | ICD-10-CM | POA: Diagnosis not present

## 2020-06-27 DIAGNOSIS — I129 Hypertensive chronic kidney disease with stage 1 through stage 4 chronic kidney disease, or unspecified chronic kidney disease: Secondary | ICD-10-CM | POA: Diagnosis not present

## 2020-06-27 DIAGNOSIS — S32029D Unspecified fracture of second lumbar vertebra, subsequent encounter for fracture with routine healing: Secondary | ICD-10-CM | POA: Diagnosis not present

## 2020-07-01 DIAGNOSIS — S32029D Unspecified fracture of second lumbar vertebra, subsequent encounter for fracture with routine healing: Secondary | ICD-10-CM | POA: Diagnosis not present

## 2020-07-01 DIAGNOSIS — S2242XD Multiple fractures of ribs, left side, subsequent encounter for fracture with routine healing: Secondary | ICD-10-CM | POA: Diagnosis not present

## 2020-07-01 DIAGNOSIS — D631 Anemia in chronic kidney disease: Secondary | ICD-10-CM | POA: Diagnosis not present

## 2020-07-01 DIAGNOSIS — N1832 Chronic kidney disease, stage 3b: Secondary | ICD-10-CM | POA: Diagnosis not present

## 2020-07-01 DIAGNOSIS — S32019D Unspecified fracture of first lumbar vertebra, subsequent encounter for fracture with routine healing: Secondary | ICD-10-CM | POA: Diagnosis not present

## 2020-07-01 DIAGNOSIS — I129 Hypertensive chronic kidney disease with stage 1 through stage 4 chronic kidney disease, or unspecified chronic kidney disease: Secondary | ICD-10-CM | POA: Diagnosis not present

## 2020-07-04 DIAGNOSIS — N1832 Chronic kidney disease, stage 3b: Secondary | ICD-10-CM | POA: Diagnosis not present

## 2020-07-04 DIAGNOSIS — S32029D Unspecified fracture of second lumbar vertebra, subsequent encounter for fracture with routine healing: Secondary | ICD-10-CM | POA: Diagnosis not present

## 2020-07-04 DIAGNOSIS — S32019D Unspecified fracture of first lumbar vertebra, subsequent encounter for fracture with routine healing: Secondary | ICD-10-CM | POA: Diagnosis not present

## 2020-07-04 DIAGNOSIS — D631 Anemia in chronic kidney disease: Secondary | ICD-10-CM | POA: Diagnosis not present

## 2020-07-04 DIAGNOSIS — I129 Hypertensive chronic kidney disease with stage 1 through stage 4 chronic kidney disease, or unspecified chronic kidney disease: Secondary | ICD-10-CM | POA: Diagnosis not present

## 2020-07-04 DIAGNOSIS — S2242XD Multiple fractures of ribs, left side, subsequent encounter for fracture with routine healing: Secondary | ICD-10-CM | POA: Diagnosis not present
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
## 2020-07-08 ENCOUNTER — Ambulatory Visit: Payer: Medicare Other | Admitting: Cardiology

## 2020-07-09 ENCOUNTER — Ambulatory Visit (INDEPENDENT_AMBULATORY_CARE_PROVIDER_SITE_OTHER): Payer: Medicare Other | Admitting: Neurology

## 2020-07-09 ENCOUNTER — Encounter: Payer: Self-pay | Admitting: Neurology

## 2020-07-09 ENCOUNTER — Other Ambulatory Visit: Payer: Self-pay

## 2020-07-09 VITALS — BP 112/82 | HR 75 | Ht 63.0 in | Wt 102.0 lb

## 2020-07-09 DIAGNOSIS — G20 Parkinson's disease: Secondary | ICD-10-CM

## 2020-07-09 DIAGNOSIS — F0280 Dementia in other diseases classified elsewhere without behavioral disturbance: Secondary | ICD-10-CM | POA: Diagnosis not present

## 2020-07-09 NOTE — Patient Instructions (Signed)
Call me and let me know if you are still on sertraline.  The physicians and staff at [facility] are committed to providing excellent care. You may receive a survey requesting feedback about your experience at our office. We strive to receive "very good" responses to the survey questions. If you feel that your experience would prevent you from giving the office a "very good " response, please contact our office to try to remedy the situation. We may be reached at [phone]. Thank you for taking the time out of your busy day to complete the survey.

## 2020-07-10 DIAGNOSIS — S2242XD Multiple fractures of ribs, left side, subsequent encounter for fracture with routine healing: Secondary | ICD-10-CM | POA: Diagnosis not present

## 2020-07-10 DIAGNOSIS — S32019D Unspecified fracture of first lumbar vertebra, subsequent encounter for fracture with routine healing: Secondary | ICD-10-CM | POA: Diagnosis not present

## 2020-07-10 DIAGNOSIS — D631 Anemia in chronic kidney disease: Secondary | ICD-10-CM | POA: Diagnosis not present

## 2020-07-10 DIAGNOSIS — I129 Hypertensive chronic kidney disease with stage 1 through stage 4 chronic kidney disease, or unspecified chronic kidney disease: Secondary | ICD-10-CM | POA: Diagnosis not present

## 2020-07-10 DIAGNOSIS — S32029D Unspecified fracture of second lumbar vertebra, subsequent encounter for fracture with routine healing: Secondary | ICD-10-CM | POA: Diagnosis not present

## 2020-07-10 DIAGNOSIS — N1832 Chronic kidney disease, stage 3b: Secondary | ICD-10-CM | POA: Diagnosis not present

## 2020-07-11 DIAGNOSIS — N1832 Chronic kidney disease, stage 3b: Secondary | ICD-10-CM | POA: Diagnosis not present

## 2020-07-11 DIAGNOSIS — S32029D Unspecified fracture of second lumbar vertebra, subsequent encounter for fracture with routine healing: Secondary | ICD-10-CM | POA: Diagnosis not present

## 2020-07-11 DIAGNOSIS — I129 Hypertensive chronic kidney disease with stage 1 through stage 4 chronic kidney disease, or unspecified chronic kidney disease: Secondary | ICD-10-CM | POA: Diagnosis not present

## 2020-07-11 DIAGNOSIS — D631 Anemia in chronic kidney disease: Secondary | ICD-10-CM | POA: Diagnosis not present

## 2020-07-11 DIAGNOSIS — S2242XD Multiple fractures of ribs, left side, subsequent encounter for fracture with routine healing: Secondary | ICD-10-CM | POA: Diagnosis not present

## 2020-07-11 DIAGNOSIS — S32019D Unspecified fracture of first lumbar vertebra, subsequent encounter for fracture with routine healing: Secondary | ICD-10-CM | POA: Diagnosis not present

## 2020-07-15 ENCOUNTER — Ambulatory Visit (INDEPENDENT_AMBULATORY_CARE_PROVIDER_SITE_OTHER): Payer: Medicare Other | Admitting: Cardiology

## 2020-07-15 ENCOUNTER — Encounter: Payer: Self-pay | Admitting: Cardiology

## 2020-07-15 ENCOUNTER — Other Ambulatory Visit: Payer: Self-pay

## 2020-07-15 VITALS — BP 110/60 | HR 74 | Ht 63.0 in | Wt 118.0 lb

## 2020-07-15 DIAGNOSIS — I350 Nonrheumatic aortic (valve) stenosis: Secondary | ICD-10-CM | POA: Diagnosis not present

## 2020-07-15 DIAGNOSIS — D631 Anemia in chronic kidney disease: Secondary | ICD-10-CM | POA: Diagnosis not present

## 2020-07-15 DIAGNOSIS — G20 Parkinson's disease: Secondary | ICD-10-CM

## 2020-07-15 DIAGNOSIS — S32019D Unspecified fracture of first lumbar vertebra, subsequent encounter for fracture with routine healing: Secondary | ICD-10-CM | POA: Diagnosis not present

## 2020-07-15 DIAGNOSIS — S2242XD Multiple fractures of ribs, left side, subsequent encounter for fracture with routine healing: Secondary | ICD-10-CM | POA: Diagnosis not present

## 2020-07-15 DIAGNOSIS — I951 Orthostatic hypotension: Secondary | ICD-10-CM

## 2020-07-15 DIAGNOSIS — S32029D Unspecified fracture of second lumbar vertebra, subsequent encounter for fracture with routine healing: Secondary | ICD-10-CM | POA: Diagnosis not present

## 2020-07-15 DIAGNOSIS — N1832 Chronic kidney disease, stage 3b: Secondary | ICD-10-CM | POA: Diagnosis not present

## 2020-07-15 DIAGNOSIS — I129 Hypertensive chronic kidney disease with stage 1 through stage 4 chronic kidney disease, or unspecified chronic kidney disease: Secondary | ICD-10-CM | POA: Diagnosis not present

## 2020-07-15 NOTE — Progress Notes (Signed)
Cardiology Office Note:    Date:  07/15/2020   ID:  [name], DOB [birth year], MRN [id number]  PCP:  [name], MD  [facility] Cardiologist:  [name], MD  [facility] Electrophysiologist:  None   Referring MD: [name], MD     History of Present Illness:    [name] is a 86 y.o. female here for the follow-up of aortic stenosis.  Denies any chest pain fevers chills nausea vomiting syncope.  Has had orthostatic hypotension.  Parkinson's.  Dr. [name].  They have assistant at the house now that can help lift her in the bed.  She is mostly wheelchair-bound.  She has progressed.    Past Medical History:  Diagnosis Date  . Anxiety   . Asthma   . Depression   . Diabetes mellitus without complication ([place])    1/70/01.Marland KitchenMarland Kitchenpt denies  . GERD (gastroesophageal reflux disease)   . Hearing loss   . Hypertension   . NHL (non-Hodgkin's lymphoma) ([place])    nhl dx 9/04 breast ca dx1/12  . Parkinson disease [facility])     Past Surgical History:  Procedure Laterality Date  . Ba-HA Ear implant    . ESOPHAGOGASTRODUODENOSCOPY N/A 02/04/2017   Procedure: ESOPHAGOGASTRODUODENOSCOPY (EGD);  Surgeon: Arta Silence, MD;  Location: [facility];  Service: Endoscopy;  Laterality: N/A;  . IR THORACENTESIS ASP PLEURAL SPACE W/IMG GUIDE  01/30/2020  . MASTECTOMY  2 /8/ 12   bilateral  . MASTECTOMY      Current Medications: Current Meds  Medication Sig  . acetaminophen (TYLENOL) 325 MG tablet Take 650 mg by mouth every 6 (six) hours as needed for mild pain, moderate pain, fever or headache.   [name] acyclovir (ZOVIRAX) 400 MG tablet TAKE 1 TABLET BY MOUTH TWICE DAILY  . Calcium Carbonate (CALCIUM-CARB 600 PO) Take 600 mg by mouth daily.  . carbidopa-levodopa (SINEMET IR) 25-100 MG tablet TAKE 1 TABLET BY MOUTH DAILY EVERY MORNING, 1 IN THE AFTERNOON, AND 2 IN THE EVENING (Patient taking differently: Take 1-2 tablets by mouth See admin instructions. Take 1 tablet  in the morning and 1 tablet in the afternoon and 2 tablets in the evening.)  . cholecalciferol (VITAMIN D) 1000 units tablet Take 1,000 Units by mouth every evening.   . docusate sodium (COLACE) 100 MG capsule Take 100 mg by mouth daily as needed for mild constipation.  . Multiple Vitamins-Minerals (MULTIVITAMIN WITH MINERALS) tablet Take 1 tablet by mouth daily with breakfast.  . pantoprazole (PROTONIX) 40 MG tablet Take 1 tablet (40 mg total) by mouth daily.  . sertraline (ZOLOFT) 100 MG tablet Take 1 tablet (100 mg total) by mouth daily.     Allergies:   Azithromycin and Tramadol   Social History   Socioeconomic History  . Marital status: Married    Spouse name: Not on file  . Number of children: Not on file  . Years of education: Not on file  . Highest education level: Not on file  Occupational History  . Occupation: retired    Fish farm manager: RETIRED    Comment: [employer]  Tobacco Use  . Smoking status: Never Smoker  . Smokeless tobacco: Never Used  . Tobacco comment: husband wsa smoker  Vaping Use  . Vaping Use: Never used  Substance and Sexual Activity  . Alcohol use: No    Comment: no  . Drug use: No  . Sexual activity: Not on file  Other Topics Concern  . Not on file  Social History Narrative  Right handed   One story    Lives with spouse   Social Determinants of Health   Financial Resource Strain: Not on file  Food Insecurity: Not on file  Transportation Needs: Not on file  Physical Activity: Not on file  Stress: Not on file  Social Connections: Not on file     Family History: The patient's family history includes Cancer in her father and mother; Lung cancer in her father.  ROS:   Please see the history of present illness.     All other systems reviewed and are negative.  EKGs/Labs/Other Studies Reviewed:    The following studies were reviewed today:   02/17/18 ECHO  - Left ventricle: The cavity size was normal. Wall thickness was increased  in a pattern of mild LVH. There was moderate focal basal hypertrophy of the septum. Systolic function was normal. The estimated ejection fraction was in the range of 60% to 65%. Wall motion was normal; there were no regional wall motion abnormalities. Doppler parameters are consistent with abnormal left ventricular relaxation (grade 1 diastolic dysfunction). Doppler parameters are consistent with high ventricular filling pressure. - Aortic valve: Valve mobility was restricted. There was moderate to severe stenosis. There was mild regurgitation. Valve area (VTI): 1 cm^2. Valve area (Vmax): 0.8 cm^2. Valve area (Vmean): 0.77 cm^2. - Mitral valve: Calcified annulus. There was mild regurgitation. - Left atrium: The atrium was mildly dilated.  Impressions:  - Normal LV systolic function; mild LVH; mild diastolic dysfunction; calcified aortic valve with moderate to severe AS and mild AI; mild MR; mild LAE.  EKG: Sinus rhythm 83 with PVCs  Recent Labs: 02/18/2020: Magnesium 1.7; TSH 4.271 05/27/2020: ALT <6; BUN 17; Creatinine 0.88; Hemoglobin 11.5; Platelet Count 151; Potassium 3.8; Sodium 141  Recent Lipid Panel No results found for: CHOL, TRIG, HDL, CHOLHDL, VLDL, LDLCALC, LDLDIRECT   Risk Assessment/Calculations:       Physical Exam:    VS:  BP 110/60 (BP Location: Left Arm, Patient Position: Sitting, Cuff Size: Normal)   Pulse 74   Ht 5\' 3"  (1.6 m)   Wt 118 lb (53.5 kg)   LMP  (LMP Unknown)   SpO2 98%   BMI 20.90 kg/m     Wt Readings from Last 3 Encounters:  07/15/20 118 lb (53.5 kg)  07/09/20 102 lb (46.3 kg)  05/27/20 104 lb 8 oz (47.4 kg)     GEN: Thin, in wheelchair HEENT: Normal NECK: No JVD; No carotid bruits LYMPHATICS: No lymphadenopathy CARDIAC: RRR, no murmurs, rubs, gallops RESPIRATORY:  Clear to auscultation without rales, wheezing or rhonchi  ABDOMEN: Soft, non-tender, non-distended MUSCULOSKELETAL:  No edema; No deformity   SKIN: Warm and dry NEUROLOGIC: Alert, quiet PSYCHIATRIC:  Normal affect   ASSESSMENT:    1. Orthostatic hypotension   2. Nonrheumatic aortic valve stenosis   3. Parkinson disease ([place])    PLAN:    In order of problems listed above:  Aortic stenosis -Previously moderate in severity. -Clinically, no significant changes.  Given the advancement of her Parkinson's, wheelchair-bound for the most part, she would not be a candidate for invasive therapies.  Forego echocardiogram.  Continue to monitor clinically  Orthostatic hypotension -Willing to tolerate higher blood pressure.  Overall been doing quite well.  Parkinson's disease with memory impairment -Salt liberalization of fluid support hose.  Continue with exercise.  Dr. [name].        Medication Adjustments/Labs and Tests Ordered: Current medicines are reviewed at length with the patient today.  Concerns regarding medicines are outlined above.  No orders of the defined types were placed in this encounter.  No orders of the defined types were placed in this encounter.   Patient Instructions  Medication Instructions:  The current medical regimen is effective;  continue present plan and medications.  *If you need a refill on your cardiac medications before your next appointment, please call your pharmacy*  Follow-Up: At [facility], you and your health needs are our priority.  As part of our continuing mission to provide you with exceptional heart care, we have created designated Provider Care Teams.  These Care Teams include your primary Cardiologist (physician) and Advanced Practice Providers (APPs -  Physician Assistants and Nurse Practitioners) who all work together to provide you with the care you need, when you need it.  We recommend signing up for the patient portal called "MyChart".  Sign up information is provided on this After Visit Summary.  MyChart is used to connect with patients for Virtual Visits (Telemedicine).   Patients are able to view lab/test results, encounter notes, upcoming appointments, etc.  Non-urgent messages can be sent to your provider as well.   To learn more about what you can do with MyChart, go to [email or web address].    Your next appointment:   12 month(s)  The format for your next appointment:   In Person  Provider:   [name], MD   Thank you for choosing [facility]!!        Signed, [name], MD  07/15/2020 12:10 PM    [place]

## 2020-07-15 NOTE — Patient Instructions (Signed)
Medication Instructions:  The current medical regimen is effective;  continue present plan and medications.  *If you need a refill on your cardiac medications before your next appointment, please call your pharmacy*  Follow-Up: At [facility], you and your health needs are our priority.  As part of our continuing mission to provide you with exceptional heart care, we have created designated Provider Care Teams.  These Care Teams include your primary Cardiologist (physician) and Advanced Practice Providers (APPs -  Physician Assistants and Nurse Practitioners) who all work together to provide you with the care you need, when you need it.  We recommend signing up for the patient portal called "MyChart".  Sign up information is provided on this After Visit Summary.  MyChart is used to connect with patients for Virtual Visits (Telemedicine).  Patients are able to view lab/test results, encounter notes, upcoming appointments, etc.  Non-urgent messages can be sent to your provider as well.   To learn more about what you can do with MyChart, go to [email or web address].com.    Your next appointment:   12 month(s)  The format for your next appointment:   In Person  Provider:   Mark Skains, MD   Thank you for choosing [facility]!!      

## 2020-07-16 DIAGNOSIS — S32029D Unspecified fracture of second lumbar vertebra, subsequent encounter for fracture with routine healing: Secondary | ICD-10-CM | POA: Diagnosis not present

## 2020-07-16 DIAGNOSIS — I129 Hypertensive chronic kidney disease with stage 1 through stage 4 chronic kidney disease, or unspecified chronic kidney disease: Secondary | ICD-10-CM | POA: Diagnosis not present

## 2020-07-16 DIAGNOSIS — D631 Anemia in chronic kidney disease: Secondary | ICD-10-CM | POA: Diagnosis not present

## 2020-07-16 DIAGNOSIS — S2242XD Multiple fractures of ribs, left side, subsequent encounter for fracture with routine healing: Secondary | ICD-10-CM | POA: Diagnosis not present

## 2020-07-16 DIAGNOSIS — N1832 Chronic kidney disease, stage 3b: Secondary | ICD-10-CM | POA: Diagnosis not present

## 2020-07-16 DIAGNOSIS — S32019D Unspecified fracture of first lumbar vertebra, subsequent encounter for fracture with routine healing: Secondary | ICD-10-CM | POA: Diagnosis not present

## 2020-07-18 DIAGNOSIS — G9341 Metabolic encephalopathy: Secondary | ICD-10-CM | POA: Diagnosis not present

## 2020-07-18 DIAGNOSIS — H9193 Unspecified hearing loss, bilateral: Secondary | ICD-10-CM | POA: Diagnosis not present

## 2020-07-18 DIAGNOSIS — G20 Parkinson's disease: Secondary | ICD-10-CM | POA: Diagnosis not present

## 2020-07-18 DIAGNOSIS — Z9013 Acquired absence of bilateral breasts and nipples: Secondary | ICD-10-CM | POA: Diagnosis not present

## 2020-07-18 DIAGNOSIS — R32 Unspecified urinary incontinence: Secondary | ICD-10-CM | POA: Diagnosis not present

## 2020-07-18 DIAGNOSIS — I350 Nonrheumatic aortic (valve) stenosis: Secondary | ICD-10-CM | POA: Diagnosis not present

## 2020-07-18 DIAGNOSIS — N1832 Chronic kidney disease, stage 3b: Secondary | ICD-10-CM | POA: Diagnosis not present

## 2020-07-18 DIAGNOSIS — R339 Retention of urine, unspecified: Secondary | ICD-10-CM | POA: Diagnosis not present

## 2020-07-18 DIAGNOSIS — Z853 Personal history of malignant neoplasm of breast: Secondary | ICD-10-CM | POA: Diagnosis not present

## 2020-07-18 DIAGNOSIS — I129 Hypertensive chronic kidney disease with stage 1 through stage 4 chronic kidney disease, or unspecified chronic kidney disease: Secondary | ICD-10-CM | POA: Diagnosis not present

## 2020-07-18 DIAGNOSIS — Z8572 Personal history of non-Hodgkin lymphomas: Secondary | ICD-10-CM | POA: Diagnosis not present

## 2020-07-18 DIAGNOSIS — S32019D Unspecified fracture of first lumbar vertebra, subsequent encounter for fracture with routine healing: Secondary | ICD-10-CM | POA: Diagnosis not present

## 2020-07-18 DIAGNOSIS — R1310 Dysphagia, unspecified: Secondary | ICD-10-CM | POA: Diagnosis not present

## 2020-07-18 DIAGNOSIS — S32029D Unspecified fracture of second lumbar vertebra, subsequent encounter for fracture with routine healing: Secondary | ICD-10-CM | POA: Diagnosis not present

## 2020-07-18 DIAGNOSIS — D631 Anemia in chronic kidney disease: Secondary | ICD-10-CM | POA: Diagnosis not present

## 2020-07-18 DIAGNOSIS — F0280 Dementia in other diseases classified elsewhere without behavioral disturbance: Secondary | ICD-10-CM | POA: Diagnosis not present

## 2020-07-18 DIAGNOSIS — S2242XD Multiple fractures of ribs, left side, subsequent encounter for fracture with routine healing: Secondary | ICD-10-CM | POA: Diagnosis not present

## 2020-07-18 DIAGNOSIS — F329 Major depressive disorder, single episode, unspecified: Secondary | ICD-10-CM | POA: Diagnosis not present

## 2020-07-18 DIAGNOSIS — K219 Gastro-esophageal reflux disease without esophagitis: Secondary | ICD-10-CM | POA: Diagnosis not present

## 2020-07-18 DIAGNOSIS — J45909 Unspecified asthma, uncomplicated: Secondary | ICD-10-CM | POA: Diagnosis not present

## 2020-07-25 DIAGNOSIS — S2242XD Multiple fractures of ribs, left side, subsequent encounter for fracture with routine healing: Secondary | ICD-10-CM | POA: Diagnosis not present

## 2020-07-25 DIAGNOSIS — N1832 Chronic kidney disease, stage 3b: Secondary | ICD-10-CM | POA: Diagnosis not present

## 2020-07-25 DIAGNOSIS — I129 Hypertensive chronic kidney disease with stage 1 through stage 4 chronic kidney disease, or unspecified chronic kidney disease: Secondary | ICD-10-CM | POA: Diagnosis not present

## 2020-07-25 DIAGNOSIS — S32019D Unspecified fracture of first lumbar vertebra, subsequent encounter for fracture with routine healing: Secondary | ICD-10-CM | POA: Diagnosis not present

## 2020-07-25 DIAGNOSIS — S32029D Unspecified fracture of second lumbar vertebra, subsequent encounter for fracture with routine healing: Secondary | ICD-10-CM | POA: Diagnosis not present

## 2020-07-25 DIAGNOSIS — D631 Anemia in chronic kidney disease: Secondary | ICD-10-CM | POA: Diagnosis not present

## 2020-07-29 DIAGNOSIS — S32029D Unspecified fracture of second lumbar vertebra, subsequent encounter for fracture with routine healing: Secondary | ICD-10-CM | POA: Diagnosis not present

## 2020-07-29 DIAGNOSIS — D631 Anemia in chronic kidney disease: Secondary | ICD-10-CM | POA: Diagnosis not present

## 2020-07-29 DIAGNOSIS — S32019D Unspecified fracture of first lumbar vertebra, subsequent encounter for fracture with routine healing: Secondary | ICD-10-CM | POA: Diagnosis not present

## 2020-07-29 DIAGNOSIS — S2242XD Multiple fractures of ribs, left side, subsequent encounter for fracture with routine healing: Secondary | ICD-10-CM | POA: Diagnosis not present

## 2020-07-29 DIAGNOSIS — I129 Hypertensive chronic kidney disease with stage 1 through stage 4 chronic kidney disease, or unspecified chronic kidney disease: Secondary | ICD-10-CM | POA: Diagnosis not present

## 2020-07-29 DIAGNOSIS — N1832 Chronic kidney disease, stage 3b: Secondary | ICD-10-CM | POA: Diagnosis not present

## 2020-08-01 DIAGNOSIS — N1832 Chronic kidney disease, stage 3b: Secondary | ICD-10-CM | POA: Diagnosis not present

## 2020-08-01 DIAGNOSIS — S2242XD Multiple fractures of ribs, left side, subsequent encounter for fracture with routine healing: Secondary | ICD-10-CM | POA: Diagnosis not present

## 2020-08-01 DIAGNOSIS — D631 Anemia in chronic kidney disease: Secondary | ICD-10-CM | POA: Diagnosis not present

## 2020-08-01 DIAGNOSIS — S32019D Unspecified fracture of first lumbar vertebra, subsequent encounter for fracture with routine healing: Secondary | ICD-10-CM | POA: Diagnosis not present

## 2020-08-01 DIAGNOSIS — S32029D Unspecified fracture of second lumbar vertebra, subsequent encounter for fracture with routine healing: Secondary | ICD-10-CM | POA: Diagnosis not present

## 2020-08-01 DIAGNOSIS — I129 Hypertensive chronic kidney disease with stage 1 through stage 4 chronic kidney disease, or unspecified chronic kidney disease: Secondary | ICD-10-CM | POA: Diagnosis not present

## 2020-08-07 DIAGNOSIS — S32029D Unspecified fracture of second lumbar vertebra, subsequent encounter for fracture with routine healing: Secondary | ICD-10-CM | POA: Diagnosis not present

## 2020-08-07 DIAGNOSIS — N1832 Chronic kidney disease, stage 3b: Secondary | ICD-10-CM | POA: Diagnosis not present

## 2020-08-07 DIAGNOSIS — I129 Hypertensive chronic kidney disease with stage 1 through stage 4 chronic kidney disease, or unspecified chronic kidney disease: Secondary | ICD-10-CM | POA: Diagnosis not present

## 2020-08-07 DIAGNOSIS — S32019D Unspecified fracture of first lumbar vertebra, subsequent encounter for fracture with routine healing: Secondary | ICD-10-CM | POA: Diagnosis not present

## 2020-08-07 DIAGNOSIS — D631 Anemia in chronic kidney disease: Secondary | ICD-10-CM | POA: Diagnosis not present

## 2020-08-07 DIAGNOSIS — S2242XD Multiple fractures of ribs, left side, subsequent encounter for fracture with routine healing: Secondary | ICD-10-CM | POA: Diagnosis not present

## 2020-08-08 DIAGNOSIS — S2242XD Multiple fractures of ribs, left side, subsequent encounter for fracture with routine healing: Secondary | ICD-10-CM | POA: Diagnosis not present

## 2020-08-08 DIAGNOSIS — S32019D Unspecified fracture of first lumbar vertebra, subsequent encounter for fracture with routine healing: Secondary | ICD-10-CM | POA: Diagnosis not present

## 2020-08-08 DIAGNOSIS — S32029D Unspecified fracture of second lumbar vertebra, subsequent encounter for fracture with routine healing: Secondary | ICD-10-CM | POA: Diagnosis not present

## 2020-08-08 DIAGNOSIS — D631 Anemia in chronic kidney disease: Secondary | ICD-10-CM | POA: Diagnosis not present

## 2020-08-08 DIAGNOSIS — N1832 Chronic kidney disease, stage 3b: Secondary | ICD-10-CM | POA: Diagnosis not present

## 2020-08-08 DIAGNOSIS — I129 Hypertensive chronic kidney disease with stage 1 through stage 4 chronic kidney disease, or unspecified chronic kidney disease: Secondary | ICD-10-CM | POA: Diagnosis not present

## 2020-08-13 DIAGNOSIS — I129 Hypertensive chronic kidney disease with stage 1 through stage 4 chronic kidney disease, or unspecified chronic kidney disease: Secondary | ICD-10-CM | POA: Diagnosis not present

## 2020-08-13 DIAGNOSIS — S32029D Unspecified fracture of second lumbar vertebra, subsequent encounter for fracture with routine healing: Secondary | ICD-10-CM | POA: Diagnosis not present

## 2020-08-13 DIAGNOSIS — S2242XD Multiple fractures of ribs, left side, subsequent encounter for fracture with routine healing: Secondary | ICD-10-CM | POA: Diagnosis not present

## 2020-08-13 DIAGNOSIS — D631 Anemia in chronic kidney disease: Secondary | ICD-10-CM | POA: Diagnosis not present

## 2020-08-13 DIAGNOSIS — S32019D Unspecified fracture of first lumbar vertebra, subsequent encounter for fracture with routine healing: Secondary | ICD-10-CM | POA: Diagnosis not present

## 2020-08-13 DIAGNOSIS — N1832 Chronic kidney disease, stage 3b: Secondary | ICD-10-CM | POA: Diagnosis not present

## 2020-08-15 DIAGNOSIS — D631 Anemia in chronic kidney disease: Secondary | ICD-10-CM | POA: Diagnosis not present

## 2020-08-15 DIAGNOSIS — I129 Hypertensive chronic kidney disease with stage 1 through stage 4 chronic kidney disease, or unspecified chronic kidney disease: Secondary | ICD-10-CM | POA: Diagnosis not present

## 2020-08-15 DIAGNOSIS — S32019D Unspecified fracture of first lumbar vertebra, subsequent encounter for fracture with routine healing: Secondary | ICD-10-CM | POA: Diagnosis not present

## 2020-08-15 DIAGNOSIS — S32029D Unspecified fracture of second lumbar vertebra, subsequent encounter for fracture with routine healing: Secondary | ICD-10-CM | POA: Diagnosis not present

## 2020-08-15 DIAGNOSIS — S2242XD Multiple fractures of ribs, left side, subsequent encounter for fracture with routine healing: Secondary | ICD-10-CM | POA: Diagnosis not present

## 2020-08-15 DIAGNOSIS — N1832 Chronic kidney disease, stage 3b: Secondary | ICD-10-CM | POA: Diagnosis not present

## 2020-08-16 DIAGNOSIS — J209 Acute bronchitis, unspecified: Secondary | ICD-10-CM | POA: Diagnosis not present

## 2020-08-17 DIAGNOSIS — D631 Anemia in chronic kidney disease: Secondary | ICD-10-CM | POA: Diagnosis not present

## 2020-08-17 DIAGNOSIS — S2242XD Multiple fractures of ribs, left side, subsequent encounter for fracture with routine healing: Secondary | ICD-10-CM | POA: Diagnosis not present

## 2020-08-17 DIAGNOSIS — R32 Unspecified urinary incontinence: Secondary | ICD-10-CM | POA: Diagnosis not present

## 2020-08-17 DIAGNOSIS — G9341 Metabolic encephalopathy: Secondary | ICD-10-CM | POA: Diagnosis not present

## 2020-08-17 DIAGNOSIS — G20 Parkinson's disease: Secondary | ICD-10-CM | POA: Diagnosis not present

## 2020-08-17 DIAGNOSIS — F0280 Dementia in other diseases classified elsewhere without behavioral disturbance: Secondary | ICD-10-CM | POA: Diagnosis not present

## 2020-08-17 DIAGNOSIS — R339 Retention of urine, unspecified: Secondary | ICD-10-CM | POA: Diagnosis not present

## 2020-08-17 DIAGNOSIS — F329 Major depressive disorder, single episode, unspecified: Secondary | ICD-10-CM | POA: Diagnosis not present

## 2020-08-17 DIAGNOSIS — N1832 Chronic kidney disease, stage 3b: Secondary | ICD-10-CM | POA: Diagnosis not present

## 2020-08-17 DIAGNOSIS — H9193 Unspecified hearing loss, bilateral: Secondary | ICD-10-CM | POA: Diagnosis not present

## 2020-08-17 DIAGNOSIS — K219 Gastro-esophageal reflux disease without esophagitis: Secondary | ICD-10-CM | POA: Diagnosis not present

## 2020-08-17 DIAGNOSIS — S32029D Unspecified fracture of second lumbar vertebra, subsequent encounter for fracture with routine healing: Secondary | ICD-10-CM | POA: Diagnosis not present

## 2020-08-17 DIAGNOSIS — S32019D Unspecified fracture of first lumbar vertebra, subsequent encounter for fracture with routine healing: Secondary | ICD-10-CM | POA: Diagnosis not present

## 2020-08-17 DIAGNOSIS — Z853 Personal history of malignant neoplasm of breast: Secondary | ICD-10-CM | POA: Diagnosis not present

## 2020-08-17 DIAGNOSIS — J45909 Unspecified asthma, uncomplicated: Secondary | ICD-10-CM | POA: Diagnosis not present

## 2020-08-17 DIAGNOSIS — I350 Nonrheumatic aortic (valve) stenosis: Secondary | ICD-10-CM | POA: Diagnosis not present

## 2020-08-17 DIAGNOSIS — R1310 Dysphagia, unspecified: Secondary | ICD-10-CM | POA: Diagnosis not present

## 2020-08-17 DIAGNOSIS — Z8572 Personal history of non-Hodgkin lymphomas: Secondary | ICD-10-CM | POA: Diagnosis not present

## 2020-08-17 DIAGNOSIS — Z9013 Acquired absence of bilateral breasts and nipples: Secondary | ICD-10-CM | POA: Diagnosis not present

## 2020-08-17 DIAGNOSIS — I129 Hypertensive chronic kidney disease with stage 1 through stage 4 chronic kidney disease, or unspecified chronic kidney disease: Secondary | ICD-10-CM | POA: Diagnosis not present

## 2020-08-22 DIAGNOSIS — I129 Hypertensive chronic kidney disease with stage 1 through stage 4 chronic kidney disease, or unspecified chronic kidney disease: Secondary | ICD-10-CM | POA: Diagnosis not present

## 2020-08-22 DIAGNOSIS — S32029D Unspecified fracture of second lumbar vertebra, subsequent encounter for fracture with routine healing: Secondary | ICD-10-CM | POA: Diagnosis not present

## 2020-08-22 DIAGNOSIS — S2242XD Multiple fractures of ribs, left side, subsequent encounter for fracture with routine healing: Secondary | ICD-10-CM | POA: Diagnosis not present

## 2020-08-22 DIAGNOSIS — S32019D Unspecified fracture of first lumbar vertebra, subsequent encounter for fracture with routine healing: Secondary | ICD-10-CM | POA: Diagnosis not present

## 2020-08-22 DIAGNOSIS — D631 Anemia in chronic kidney disease: Secondary | ICD-10-CM | POA: Diagnosis not present

## 2020-08-22 DIAGNOSIS — N1832 Chronic kidney disease, stage 3b: Secondary | ICD-10-CM | POA: Diagnosis not present

## 2020-08-26 DIAGNOSIS — S32029D Unspecified fracture of second lumbar vertebra, subsequent encounter for fracture with routine healing: Secondary | ICD-10-CM | POA: Diagnosis not present

## 2020-08-26 DIAGNOSIS — N1832 Chronic kidney disease, stage 3b: Secondary | ICD-10-CM | POA: Diagnosis not present

## 2020-08-26 DIAGNOSIS — D631 Anemia in chronic kidney disease: Secondary | ICD-10-CM | POA: Diagnosis not present

## 2020-08-26 DIAGNOSIS — S2242XD Multiple fractures of ribs, left side, subsequent encounter for fracture with routine healing: Secondary | ICD-10-CM | POA: Diagnosis not present

## 2020-08-26 DIAGNOSIS — I129 Hypertensive chronic kidney disease with stage 1 through stage 4 chronic kidney disease, or unspecified chronic kidney disease: Secondary | ICD-10-CM | POA: Diagnosis not present

## 2020-08-26 DIAGNOSIS — S32019D Unspecified fracture of first lumbar vertebra, subsequent encounter for fracture with routine healing: Secondary | ICD-10-CM | POA: Diagnosis not present

## 2020-08-29 DIAGNOSIS — N1832 Chronic kidney disease, stage 3b: Secondary | ICD-10-CM | POA: Diagnosis not present

## 2020-08-29 DIAGNOSIS — S32019D Unspecified fracture of first lumbar vertebra, subsequent encounter for fracture with routine healing: Secondary | ICD-10-CM | POA: Diagnosis not present

## 2020-08-29 DIAGNOSIS — I129 Hypertensive chronic kidney disease with stage 1 through stage 4 chronic kidney disease, or unspecified chronic kidney disease: Secondary | ICD-10-CM | POA: Diagnosis not present

## 2020-08-29 DIAGNOSIS — S2242XD Multiple fractures of ribs, left side, subsequent encounter for fracture with routine healing: Secondary | ICD-10-CM | POA: Diagnosis not present

## 2020-08-29 DIAGNOSIS — D631 Anemia in chronic kidney disease: Secondary | ICD-10-CM | POA: Diagnosis not present

## 2020-08-29 DIAGNOSIS — S32029D Unspecified fracture of second lumbar vertebra, subsequent encounter for fracture with routine healing: Secondary | ICD-10-CM | POA: Diagnosis not present

## 2020-09-02 DIAGNOSIS — D631 Anemia in chronic kidney disease: Secondary | ICD-10-CM | POA: Diagnosis not present

## 2020-09-02 DIAGNOSIS — S32029D Unspecified fracture of second lumbar vertebra, subsequent encounter for fracture with routine healing: Secondary | ICD-10-CM | POA: Diagnosis not present

## 2020-09-02 DIAGNOSIS — I129 Hypertensive chronic kidney disease with stage 1 through stage 4 chronic kidney disease, or unspecified chronic kidney disease: Secondary | ICD-10-CM | POA: Diagnosis not present

## 2020-09-02 DIAGNOSIS — N1832 Chronic kidney disease, stage 3b: Secondary | ICD-10-CM | POA: Diagnosis not present

## 2020-09-02 DIAGNOSIS — S2242XD Multiple fractures of ribs, left side, subsequent encounter for fracture with routine healing: Secondary | ICD-10-CM | POA: Diagnosis not present

## 2020-09-02 DIAGNOSIS — S32019D Unspecified fracture of first lumbar vertebra, subsequent encounter for fracture with routine healing: Secondary | ICD-10-CM | POA: Diagnosis not present

## 2020-09-05 DIAGNOSIS — S2242XD Multiple fractures of ribs, left side, subsequent encounter for fracture with routine healing: Secondary | ICD-10-CM | POA: Diagnosis not present

## 2020-09-05 DIAGNOSIS — S32029D Unspecified fracture of second lumbar vertebra, subsequent encounter for fracture with routine healing: Secondary | ICD-10-CM | POA: Diagnosis not present

## 2020-09-05 DIAGNOSIS — D631 Anemia in chronic kidney disease: Secondary | ICD-10-CM | POA: Diagnosis not present

## 2020-09-05 DIAGNOSIS — N1832 Chronic kidney disease, stage 3b: Secondary | ICD-10-CM | POA: Diagnosis not present

## 2020-09-05 DIAGNOSIS — S32019D Unspecified fracture of first lumbar vertebra, subsequent encounter for fracture with routine healing: Secondary | ICD-10-CM | POA: Diagnosis not present

## 2020-09-05 DIAGNOSIS — I129 Hypertensive chronic kidney disease with stage 1 through stage 4 chronic kidney disease, or unspecified chronic kidney disease: Secondary | ICD-10-CM | POA: Diagnosis not present

## 2020-09-12 DIAGNOSIS — E7800 Pure hypercholesterolemia, unspecified: Secondary | ICD-10-CM | POA: Diagnosis not present

## 2020-09-12 DIAGNOSIS — I129 Hypertensive chronic kidney disease with stage 1 through stage 4 chronic kidney disease, or unspecified chronic kidney disease: Secondary | ICD-10-CM | POA: Diagnosis not present

## 2020-09-12 DIAGNOSIS — N1832 Chronic kidney disease, stage 3b: Secondary | ICD-10-CM | POA: Diagnosis not present

## 2020-09-12 DIAGNOSIS — F419 Anxiety disorder, unspecified: Secondary | ICD-10-CM | POA: Diagnosis not present

## 2020-09-12 DIAGNOSIS — D500 Iron deficiency anemia secondary to blood loss (chronic): Secondary | ICD-10-CM | POA: Diagnosis not present

## 2020-09-12 DIAGNOSIS — F0280 Dementia in other diseases classified elsewhere without behavioral disturbance: Secondary | ICD-10-CM | POA: Diagnosis not present

## 2020-09-12 DIAGNOSIS — S32029D Unspecified fracture of second lumbar vertebra, subsequent encounter for fracture with routine healing: Secondary | ICD-10-CM | POA: Diagnosis not present

## 2020-09-12 DIAGNOSIS — D631 Anemia in chronic kidney disease: Secondary | ICD-10-CM | POA: Diagnosis not present

## 2020-09-12 DIAGNOSIS — G20 Parkinson's disease: Secondary | ICD-10-CM | POA: Diagnosis not present

## 2020-09-12 DIAGNOSIS — J209 Acute bronchitis, unspecified: Secondary | ICD-10-CM | POA: Diagnosis not present

## 2020-09-12 DIAGNOSIS — S2242XD Multiple fractures of ribs, left side, subsequent encounter for fracture with routine healing: Secondary | ICD-10-CM | POA: Diagnosis not present

## 2020-09-12 DIAGNOSIS — G8929 Other chronic pain: Secondary | ICD-10-CM | POA: Diagnosis not present

## 2020-09-12 DIAGNOSIS — N183 Chronic kidney disease, stage 3 unspecified: Secondary | ICD-10-CM | POA: Diagnosis not present

## 2020-09-12 DIAGNOSIS — S32019D Unspecified fracture of first lumbar vertebra, subsequent encounter for fracture with routine healing: Secondary | ICD-10-CM | POA: Diagnosis not present

## 2020-09-16 DIAGNOSIS — I129 Hypertensive chronic kidney disease with stage 1 through stage 4 chronic kidney disease, or unspecified chronic kidney disease: Secondary | ICD-10-CM | POA: Diagnosis not present

## 2020-09-16 DIAGNOSIS — I350 Nonrheumatic aortic (valve) stenosis: Secondary | ICD-10-CM | POA: Diagnosis not present

## 2020-09-16 DIAGNOSIS — Z8572 Personal history of non-Hodgkin lymphomas: Secondary | ICD-10-CM | POA: Diagnosis not present

## 2020-09-16 DIAGNOSIS — K219 Gastro-esophageal reflux disease without esophagitis: Secondary | ICD-10-CM | POA: Diagnosis not present

## 2020-09-16 DIAGNOSIS — R32 Unspecified urinary incontinence: Secondary | ICD-10-CM | POA: Diagnosis not present

## 2020-09-16 DIAGNOSIS — R339 Retention of urine, unspecified: Secondary | ICD-10-CM | POA: Diagnosis not present

## 2020-09-16 DIAGNOSIS — Z853 Personal history of malignant neoplasm of breast: Secondary | ICD-10-CM | POA: Diagnosis not present

## 2020-09-16 DIAGNOSIS — S32029D Unspecified fracture of second lumbar vertebra, subsequent encounter for fracture with routine healing: Secondary | ICD-10-CM | POA: Diagnosis not present

## 2020-09-16 DIAGNOSIS — F329 Major depressive disorder, single episode, unspecified: Secondary | ICD-10-CM | POA: Diagnosis not present

## 2020-09-16 DIAGNOSIS — D631 Anemia in chronic kidney disease: Secondary | ICD-10-CM | POA: Diagnosis not present

## 2020-09-16 DIAGNOSIS — H9193 Unspecified hearing loss, bilateral: Secondary | ICD-10-CM | POA: Diagnosis not present

## 2020-09-16 DIAGNOSIS — R1310 Dysphagia, unspecified: Secondary | ICD-10-CM | POA: Diagnosis not present

## 2020-09-16 DIAGNOSIS — G9341 Metabolic encephalopathy: Secondary | ICD-10-CM | POA: Diagnosis not present

## 2020-09-16 DIAGNOSIS — G20 Parkinson's disease: Secondary | ICD-10-CM | POA: Diagnosis not present

## 2020-09-16 DIAGNOSIS — Z9013 Acquired absence of bilateral breasts and nipples: Secondary | ICD-10-CM | POA: Diagnosis not present

## 2020-09-16 DIAGNOSIS — S32019D Unspecified fracture of first lumbar vertebra, subsequent encounter for fracture with routine healing: Secondary | ICD-10-CM | POA: Diagnosis not present

## 2020-09-16 DIAGNOSIS — F0280 Dementia in other diseases classified elsewhere without behavioral disturbance: Secondary | ICD-10-CM | POA: Diagnosis not present

## 2020-09-16 DIAGNOSIS — S2242XD Multiple fractures of ribs, left side, subsequent encounter for fracture with routine healing: Secondary | ICD-10-CM | POA: Diagnosis not present

## 2020-09-16 DIAGNOSIS — N1832 Chronic kidney disease, stage 3b: Secondary | ICD-10-CM | POA: Diagnosis not present

## 2020-09-16 DIAGNOSIS — J45909 Unspecified asthma, uncomplicated: Secondary | ICD-10-CM | POA: Diagnosis not present

## 2020-09-18 DIAGNOSIS — N1832 Chronic kidney disease, stage 3b: Secondary | ICD-10-CM | POA: Diagnosis not present

## 2020-09-18 DIAGNOSIS — S32019D Unspecified fracture of first lumbar vertebra, subsequent encounter for fracture with routine healing: Secondary | ICD-10-CM | POA: Diagnosis not present

## 2020-09-18 DIAGNOSIS — I129 Hypertensive chronic kidney disease with stage 1 through stage 4 chronic kidney disease, or unspecified chronic kidney disease: Secondary | ICD-10-CM | POA: Diagnosis not present

## 2020-09-18 DIAGNOSIS — S32029D Unspecified fracture of second lumbar vertebra, subsequent encounter for fracture with routine healing: Secondary | ICD-10-CM | POA: Diagnosis not present

## 2020-09-18 DIAGNOSIS — S2242XD Multiple fractures of ribs, left side, subsequent encounter for fracture with routine healing: Secondary | ICD-10-CM | POA: Diagnosis not present

## 2020-09-18 DIAGNOSIS — D631 Anemia in chronic kidney disease: Secondary | ICD-10-CM | POA: Diagnosis not present

## 2020-09-19 DIAGNOSIS — D631 Anemia in chronic kidney disease: Secondary | ICD-10-CM | POA: Diagnosis not present

## 2020-09-19 DIAGNOSIS — S2242XD Multiple fractures of ribs, left side, subsequent encounter for fracture with routine healing: Secondary | ICD-10-CM | POA: Diagnosis not present

## 2020-09-19 DIAGNOSIS — N1832 Chronic kidney disease, stage 3b: Secondary | ICD-10-CM | POA: Diagnosis not present

## 2020-09-19 DIAGNOSIS — S32019D Unspecified fracture of first lumbar vertebra, subsequent encounter for fracture with routine healing: Secondary | ICD-10-CM | POA: Diagnosis not present

## 2020-09-19 DIAGNOSIS — I129 Hypertensive chronic kidney disease with stage 1 through stage 4 chronic kidney disease, or unspecified chronic kidney disease: Secondary | ICD-10-CM | POA: Diagnosis not present

## 2020-09-19 DIAGNOSIS — S32029D Unspecified fracture of second lumbar vertebra, subsequent encounter for fracture with routine healing: Secondary | ICD-10-CM | POA: Diagnosis not present

## 2020-09-25 DIAGNOSIS — S2242XD Multiple fractures of ribs, left side, subsequent encounter for fracture with routine healing: Secondary | ICD-10-CM | POA: Diagnosis not present

## 2020-09-25 DIAGNOSIS — D631 Anemia in chronic kidney disease: Secondary | ICD-10-CM | POA: Diagnosis not present

## 2020-09-25 DIAGNOSIS — N1832 Chronic kidney disease, stage 3b: Secondary | ICD-10-CM | POA: Diagnosis not present

## 2020-09-25 DIAGNOSIS — S32019D Unspecified fracture of first lumbar vertebra, subsequent encounter for fracture with routine healing: Secondary | ICD-10-CM | POA: Diagnosis not present

## 2020-09-25 DIAGNOSIS — S32029D Unspecified fracture of second lumbar vertebra, subsequent encounter for fracture with routine healing: Secondary | ICD-10-CM | POA: Diagnosis not present

## 2020-09-25 DIAGNOSIS — I129 Hypertensive chronic kidney disease with stage 1 through stage 4 chronic kidney disease, or unspecified chronic kidney disease: Secondary | ICD-10-CM | POA: Diagnosis not present

## 2020-09-26 DIAGNOSIS — S32029D Unspecified fracture of second lumbar vertebra, subsequent encounter for fracture with routine healing: Secondary | ICD-10-CM | POA: Diagnosis not present

## 2020-09-26 DIAGNOSIS — N1832 Chronic kidney disease, stage 3b: Secondary | ICD-10-CM | POA: Diagnosis not present

## 2020-09-26 DIAGNOSIS — D631 Anemia in chronic kidney disease: Secondary | ICD-10-CM | POA: Diagnosis not present

## 2020-09-26 DIAGNOSIS — S32019D Unspecified fracture of first lumbar vertebra, subsequent encounter for fracture with routine healing: Secondary | ICD-10-CM | POA: Diagnosis not present

## 2020-09-26 DIAGNOSIS — I129 Hypertensive chronic kidney disease with stage 1 through stage 4 chronic kidney disease, or unspecified chronic kidney disease: Secondary | ICD-10-CM | POA: Diagnosis not present

## 2020-09-26 DIAGNOSIS — S2242XD Multiple fractures of ribs, left side, subsequent encounter for fracture with routine healing: Secondary | ICD-10-CM | POA: Diagnosis not present

## 2020-10-01 DIAGNOSIS — S32029D Unspecified fracture of second lumbar vertebra, subsequent encounter for fracture with routine healing: Secondary | ICD-10-CM | POA: Diagnosis not present

## 2020-10-01 DIAGNOSIS — D631 Anemia in chronic kidney disease: Secondary | ICD-10-CM | POA: Diagnosis not present

## 2020-10-01 DIAGNOSIS — S2242XD Multiple fractures of ribs, left side, subsequent encounter for fracture with routine healing: Secondary | ICD-10-CM | POA: Diagnosis not present

## 2020-10-01 DIAGNOSIS — S32019D Unspecified fracture of first lumbar vertebra, subsequent encounter for fracture with routine healing: Secondary | ICD-10-CM | POA: Diagnosis not present

## 2020-10-01 DIAGNOSIS — N1832 Chronic kidney disease, stage 3b: Secondary | ICD-10-CM | POA: Diagnosis not present

## 2020-10-01 DIAGNOSIS — I129 Hypertensive chronic kidney disease with stage 1 through stage 4 chronic kidney disease, or unspecified chronic kidney disease: Secondary | ICD-10-CM | POA: Diagnosis not present

## 2020-10-03 DIAGNOSIS — I129 Hypertensive chronic kidney disease with stage 1 through stage 4 chronic kidney disease, or unspecified chronic kidney disease: Secondary | ICD-10-CM | POA: Diagnosis not present

## 2020-10-03 DIAGNOSIS — S32019D Unspecified fracture of first lumbar vertebra, subsequent encounter for fracture with routine healing: Secondary | ICD-10-CM | POA: Diagnosis not present

## 2020-10-03 DIAGNOSIS — N1832 Chronic kidney disease, stage 3b: Secondary | ICD-10-CM | POA: Diagnosis not present

## 2020-10-03 DIAGNOSIS — S32029D Unspecified fracture of second lumbar vertebra, subsequent encounter for fracture with routine healing: Secondary | ICD-10-CM | POA: Diagnosis not present

## 2020-10-03 DIAGNOSIS — S2242XD Multiple fractures of ribs, left side, subsequent encounter for fracture with routine healing: Secondary | ICD-10-CM | POA: Diagnosis not present

## 2020-10-03 DIAGNOSIS — D631 Anemia in chronic kidney disease: Secondary | ICD-10-CM | POA: Diagnosis not present

## 2020-10-07 DIAGNOSIS — I129 Hypertensive chronic kidney disease with stage 1 through stage 4 chronic kidney disease, or unspecified chronic kidney disease: Secondary | ICD-10-CM | POA: Diagnosis not present

## 2020-10-07 DIAGNOSIS — S32029D Unspecified fracture of second lumbar vertebra, subsequent encounter for fracture with routine healing: Secondary | ICD-10-CM | POA: Diagnosis not present

## 2020-10-07 DIAGNOSIS — D631 Anemia in chronic kidney disease: Secondary | ICD-10-CM | POA: Diagnosis not present

## 2020-10-07 DIAGNOSIS — N1832 Chronic kidney disease, stage 3b: Secondary | ICD-10-CM | POA: Diagnosis not present

## 2020-10-07 DIAGNOSIS — S32019D Unspecified fracture of first lumbar vertebra, subsequent encounter for fracture with routine healing: Secondary | ICD-10-CM | POA: Diagnosis not present

## 2020-10-07 DIAGNOSIS — S2242XD Multiple fractures of ribs, left side, subsequent encounter for fracture with routine healing: Secondary | ICD-10-CM | POA: Diagnosis not present

## 2020-10-15 DIAGNOSIS — S32029D Unspecified fracture of second lumbar vertebra, subsequent encounter for fracture with routine healing: Secondary | ICD-10-CM | POA: Diagnosis not present

## 2020-10-15 DIAGNOSIS — S2242XD Multiple fractures of ribs, left side, subsequent encounter for fracture with routine healing: Secondary | ICD-10-CM | POA: Diagnosis not present

## 2020-10-15 DIAGNOSIS — S32019D Unspecified fracture of first lumbar vertebra, subsequent encounter for fracture with routine healing: Secondary | ICD-10-CM | POA: Diagnosis not present

## 2020-10-15 DIAGNOSIS — D631 Anemia in chronic kidney disease: Secondary | ICD-10-CM | POA: Diagnosis not present

## 2020-10-15 DIAGNOSIS — N1832 Chronic kidney disease, stage 3b: Secondary | ICD-10-CM | POA: Diagnosis not present

## 2020-10-15 DIAGNOSIS — I129 Hypertensive chronic kidney disease with stage 1 through stage 4 chronic kidney disease, or unspecified chronic kidney disease: Secondary | ICD-10-CM | POA: Diagnosis not present

## 2020-10-16 DIAGNOSIS — F329 Major depressive disorder, single episode, unspecified: Secondary | ICD-10-CM | POA: Diagnosis not present

## 2020-10-16 DIAGNOSIS — J45909 Unspecified asthma, uncomplicated: Secondary | ICD-10-CM | POA: Diagnosis not present

## 2020-10-16 DIAGNOSIS — D631 Anemia in chronic kidney disease: Secondary | ICD-10-CM | POA: Diagnosis not present

## 2020-10-16 DIAGNOSIS — S32019D Unspecified fracture of first lumbar vertebra, subsequent encounter for fracture with routine healing: Secondary | ICD-10-CM | POA: Diagnosis not present

## 2020-10-16 DIAGNOSIS — S32029D Unspecified fracture of second lumbar vertebra, subsequent encounter for fracture with routine healing: Secondary | ICD-10-CM | POA: Diagnosis not present

## 2020-10-16 DIAGNOSIS — G9341 Metabolic encephalopathy: Secondary | ICD-10-CM | POA: Diagnosis not present

## 2020-10-16 DIAGNOSIS — N1832 Chronic kidney disease, stage 3b: Secondary | ICD-10-CM | POA: Diagnosis not present

## 2020-10-16 DIAGNOSIS — H9193 Unspecified hearing loss, bilateral: Secondary | ICD-10-CM | POA: Diagnosis not present

## 2020-10-16 DIAGNOSIS — Z8572 Personal history of non-Hodgkin lymphomas: Secondary | ICD-10-CM | POA: Diagnosis not present

## 2020-10-16 DIAGNOSIS — F0280 Dementia in other diseases classified elsewhere without behavioral disturbance: Secondary | ICD-10-CM | POA: Diagnosis not present

## 2020-10-16 DIAGNOSIS — S2242XD Multiple fractures of ribs, left side, subsequent encounter for fracture with routine healing: Secondary | ICD-10-CM | POA: Diagnosis not present

## 2020-10-16 DIAGNOSIS — R339 Retention of urine, unspecified: Secondary | ICD-10-CM | POA: Diagnosis not present

## 2020-10-16 DIAGNOSIS — Z9013 Acquired absence of bilateral breasts and nipples: Secondary | ICD-10-CM | POA: Diagnosis not present

## 2020-10-16 DIAGNOSIS — R32 Unspecified urinary incontinence: Secondary | ICD-10-CM | POA: Diagnosis not present

## 2020-10-16 DIAGNOSIS — I350 Nonrheumatic aortic (valve) stenosis: Secondary | ICD-10-CM | POA: Diagnosis not present

## 2020-10-16 DIAGNOSIS — Z853 Personal history of malignant neoplasm of breast: Secondary | ICD-10-CM | POA: Diagnosis not present

## 2020-10-16 DIAGNOSIS — G20 Parkinson's disease: Secondary | ICD-10-CM | POA: Diagnosis not present

## 2020-10-16 DIAGNOSIS — R1310 Dysphagia, unspecified: Secondary | ICD-10-CM | POA: Diagnosis not present

## 2020-10-16 DIAGNOSIS — K219 Gastro-esophageal reflux disease without esophagitis: Secondary | ICD-10-CM | POA: Diagnosis not present

## 2020-10-16 DIAGNOSIS — I129 Hypertensive chronic kidney disease with stage 1 through stage 4 chronic kidney disease, or unspecified chronic kidney disease: Secondary | ICD-10-CM | POA: Diagnosis not present

## 2020-10-17 DIAGNOSIS — S32019D Unspecified fracture of first lumbar vertebra, subsequent encounter for fracture with routine healing: Secondary | ICD-10-CM | POA: Diagnosis not present

## 2020-10-17 DIAGNOSIS — I129 Hypertensive chronic kidney disease with stage 1 through stage 4 chronic kidney disease, or unspecified chronic kidney disease: Secondary | ICD-10-CM | POA: Diagnosis not present

## 2020-10-17 DIAGNOSIS — S32029D Unspecified fracture of second lumbar vertebra, subsequent encounter for fracture with routine healing: Secondary | ICD-10-CM | POA: Diagnosis not present

## 2020-10-17 DIAGNOSIS — S2242XD Multiple fractures of ribs, left side, subsequent encounter for fracture with routine healing: Secondary | ICD-10-CM | POA: Diagnosis not present

## 2020-10-17 DIAGNOSIS — N1832 Chronic kidney disease, stage 3b: Secondary | ICD-10-CM | POA: Diagnosis not present

## 2020-10-17 DIAGNOSIS — D631 Anemia in chronic kidney disease: Secondary | ICD-10-CM | POA: Diagnosis not present

## 2020-10-21 ENCOUNTER — Telehealth: Payer: Self-pay | Admitting: Oncology

## 2020-10-21 NOTE — Telephone Encounter (Signed)
I have mailed and updated appointment letter & calendar to patient advising of the new location for Dr Sherrill & Lisa Thomas, AP 

## 2020-10-23 DIAGNOSIS — D631 Anemia in chronic kidney disease: Secondary | ICD-10-CM | POA: Diagnosis not present

## 2020-10-23 DIAGNOSIS — N1832 Chronic kidney disease, stage 3b: Secondary | ICD-10-CM | POA: Diagnosis not present

## 2020-10-23 DIAGNOSIS — S2242XD Multiple fractures of ribs, left side, subsequent encounter for fracture with routine healing: Secondary | ICD-10-CM | POA: Diagnosis not present

## 2020-10-23 DIAGNOSIS — S32029D Unspecified fracture of second lumbar vertebra, subsequent encounter for fracture with routine healing: Secondary | ICD-10-CM | POA: Diagnosis not present

## 2020-10-23 DIAGNOSIS — S32019D Unspecified fracture of first lumbar vertebra, subsequent encounter for fracture with routine healing: Secondary | ICD-10-CM | POA: Diagnosis not present

## 2020-10-23 DIAGNOSIS — I129 Hypertensive chronic kidney disease with stage 1 through stage 4 chronic kidney disease, or unspecified chronic kidney disease: Secondary | ICD-10-CM | POA: Diagnosis not present

## 2020-10-24 DIAGNOSIS — D631 Anemia in chronic kidney disease: Secondary | ICD-10-CM | POA: Diagnosis not present

## 2020-10-24 DIAGNOSIS — S32029D Unspecified fracture of second lumbar vertebra, subsequent encounter for fracture with routine healing: Secondary | ICD-10-CM | POA: Diagnosis not present

## 2020-10-24 DIAGNOSIS — S32019D Unspecified fracture of first lumbar vertebra, subsequent encounter for fracture with routine healing: Secondary | ICD-10-CM | POA: Diagnosis not present

## 2020-10-24 DIAGNOSIS — S2242XD Multiple fractures of ribs, left side, subsequent encounter for fracture with routine healing: Secondary | ICD-10-CM | POA: Diagnosis not present

## 2020-10-24 DIAGNOSIS — N1832 Chronic kidney disease, stage 3b: Secondary | ICD-10-CM | POA: Diagnosis not present

## 2020-10-24 DIAGNOSIS — I129 Hypertensive chronic kidney disease with stage 1 through stage 4 chronic kidney disease, or unspecified chronic kidney disease: Secondary | ICD-10-CM | POA: Diagnosis not present

## 2020-10-30 DIAGNOSIS — N1832 Chronic kidney disease, stage 3b: Secondary | ICD-10-CM | POA: Diagnosis not present

## 2020-10-30 DIAGNOSIS — S2242XD Multiple fractures of ribs, left side, subsequent encounter for fracture with routine healing: Secondary | ICD-10-CM | POA: Diagnosis not present

## 2020-10-30 DIAGNOSIS — S32019D Unspecified fracture of first lumbar vertebra, subsequent encounter for fracture with routine healing: Secondary | ICD-10-CM | POA: Diagnosis not present

## 2020-10-30 DIAGNOSIS — I129 Hypertensive chronic kidney disease with stage 1 through stage 4 chronic kidney disease, or unspecified chronic kidney disease: Secondary | ICD-10-CM | POA: Diagnosis not present

## 2020-10-30 DIAGNOSIS — D631 Anemia in chronic kidney disease: Secondary | ICD-10-CM | POA: Diagnosis not present

## 2020-10-30 DIAGNOSIS — S32029D Unspecified fracture of second lumbar vertebra, subsequent encounter for fracture with routine healing: Secondary | ICD-10-CM | POA: Diagnosis not present

## 2020-10-31 DIAGNOSIS — N1832 Chronic kidney disease, stage 3b: Secondary | ICD-10-CM | POA: Diagnosis not present

## 2020-10-31 DIAGNOSIS — S32029D Unspecified fracture of second lumbar vertebra, subsequent encounter for fracture with routine healing: Secondary | ICD-10-CM | POA: Diagnosis not present

## 2020-10-31 DIAGNOSIS — S32019D Unspecified fracture of first lumbar vertebra, subsequent encounter for fracture with routine healing: Secondary | ICD-10-CM | POA: Diagnosis not present

## 2020-10-31 DIAGNOSIS — D631 Anemia in chronic kidney disease: Secondary | ICD-10-CM | POA: Diagnosis not present

## 2020-10-31 DIAGNOSIS — I129 Hypertensive chronic kidney disease with stage 1 through stage 4 chronic kidney disease, or unspecified chronic kidney disease: Secondary | ICD-10-CM | POA: Diagnosis not present

## 2020-10-31 DIAGNOSIS — S2242XD Multiple fractures of ribs, left side, subsequent encounter for fracture with routine healing: Secondary | ICD-10-CM | POA: Diagnosis not present

## 2020-11-06 DIAGNOSIS — S2242XD Multiple fractures of ribs, left side, subsequent encounter for fracture with routine healing: Secondary | ICD-10-CM | POA: Diagnosis not present

## 2020-11-06 DIAGNOSIS — S32029D Unspecified fracture of second lumbar vertebra, subsequent encounter for fracture with routine healing: Secondary | ICD-10-CM | POA: Diagnosis not present

## 2020-11-06 DIAGNOSIS — S32019D Unspecified fracture of first lumbar vertebra, subsequent encounter for fracture with routine healing: Secondary | ICD-10-CM | POA: Diagnosis not present

## 2020-11-06 DIAGNOSIS — D631 Anemia in chronic kidney disease: Secondary | ICD-10-CM | POA: Diagnosis not present

## 2020-11-06 DIAGNOSIS — I129 Hypertensive chronic kidney disease with stage 1 through stage 4 chronic kidney disease, or unspecified chronic kidney disease: Secondary | ICD-10-CM | POA: Diagnosis not present

## 2020-11-06 DIAGNOSIS — N1832 Chronic kidney disease, stage 3b: Secondary | ICD-10-CM | POA: Diagnosis not present

## 2020-11-07 DIAGNOSIS — N1832 Chronic kidney disease, stage 3b: Secondary | ICD-10-CM | POA: Diagnosis not present

## 2020-11-07 DIAGNOSIS — S32029D Unspecified fracture of second lumbar vertebra, subsequent encounter for fracture with routine healing: Secondary | ICD-10-CM | POA: Diagnosis not present

## 2020-11-07 DIAGNOSIS — D631 Anemia in chronic kidney disease: Secondary | ICD-10-CM | POA: Diagnosis not present

## 2020-11-07 DIAGNOSIS — I129 Hypertensive chronic kidney disease with stage 1 through stage 4 chronic kidney disease, or unspecified chronic kidney disease: Secondary | ICD-10-CM | POA: Diagnosis not present

## 2020-11-07 DIAGNOSIS — S2242XD Multiple fractures of ribs, left side, subsequent encounter for fracture with routine healing: Secondary | ICD-10-CM | POA: Diagnosis not present

## 2020-11-07 DIAGNOSIS — S32019D Unspecified fracture of first lumbar vertebra, subsequent encounter for fracture with routine healing: Secondary | ICD-10-CM | POA: Diagnosis not present

## 2020-11-11 ENCOUNTER — Emergency Department (HOSPITAL_COMMUNITY)
Admission: EM | Admit: 2020-11-11 | Discharge: 2020-11-11 | Disposition: A | Discharge status: Home / Self Care | Payer: Medicare Other | Attending: Emergency Medicine | Admitting: Emergency Medicine

## 2020-11-11 ENCOUNTER — Emergency Department (HOSPITAL_COMMUNITY): Payer: Medicare Other

## 2020-11-11 ENCOUNTER — Encounter (HOSPITAL_COMMUNITY): Payer: Self-pay

## 2020-11-11 DIAGNOSIS — Z79899 Other long term (current) drug therapy: Secondary | ICD-10-CM | POA: Diagnosis not present

## 2020-11-11 DIAGNOSIS — N1832 Chronic kidney disease, stage 3b: Secondary | ICD-10-CM | POA: Diagnosis not present

## 2020-11-11 DIAGNOSIS — Z853 Personal history of malignant neoplasm of breast: Secondary | ICD-10-CM | POA: Diagnosis not present

## 2020-11-11 DIAGNOSIS — R410 Disorientation, unspecified: Secondary | ICD-10-CM

## 2020-11-11 DIAGNOSIS — J45909 Unspecified asthma, uncomplicated: Secondary | ICD-10-CM | POA: Insufficient documentation

## 2020-11-11 DIAGNOSIS — S32029D Unspecified fracture of second lumbar vertebra, subsequent encounter for fracture with routine healing: Secondary | ICD-10-CM | POA: Diagnosis not present

## 2020-11-11 DIAGNOSIS — R404 Transient alteration of awareness: Secondary | ICD-10-CM | POA: Diagnosis not present

## 2020-11-11 DIAGNOSIS — R4182 Altered mental status, unspecified: Secondary | ICD-10-CM | POA: Diagnosis not present

## 2020-11-11 DIAGNOSIS — G20 Parkinson's disease: Secondary | ICD-10-CM | POA: Diagnosis not present

## 2020-11-11 DIAGNOSIS — E119 Type 2 diabetes mellitus without complications: Secondary | ICD-10-CM | POA: Insufficient documentation

## 2020-11-11 DIAGNOSIS — J90 Pleural effusion, not elsewhere classified: Secondary | ICD-10-CM | POA: Diagnosis not present

## 2020-11-11 DIAGNOSIS — I10 Essential (primary) hypertension: Secondary | ICD-10-CM | POA: Diagnosis not present

## 2020-11-11 DIAGNOSIS — S2242XD Multiple fractures of ribs, left side, subsequent encounter for fracture with routine healing: Secondary | ICD-10-CM | POA: Diagnosis not present

## 2020-11-11 DIAGNOSIS — F0280 Dementia in other diseases classified elsewhere without behavioral disturbance: Secondary | ICD-10-CM | POA: Diagnosis not present

## 2020-11-11 DIAGNOSIS — I517 Cardiomegaly: Secondary | ICD-10-CM | POA: Diagnosis not present

## 2020-11-11 DIAGNOSIS — D631 Anemia in chronic kidney disease: Secondary | ICD-10-CM | POA: Diagnosis not present

## 2020-11-11 DIAGNOSIS — I129 Hypertensive chronic kidney disease with stage 1 through stage 4 chronic kidney disease, or unspecified chronic kidney disease: Secondary | ICD-10-CM | POA: Diagnosis not present

## 2020-11-11 DIAGNOSIS — S32019D Unspecified fracture of first lumbar vertebra, subsequent encounter for fracture with routine healing: Secondary | ICD-10-CM | POA: Diagnosis not present

## 2020-11-11 LAB — URINALYSIS, ROUTINE W REFLEX MICROSCOPIC
Bilirubin Urine: NEGATIVE
Glucose, UA: NEGATIVE mg/dL
Hgb urine dipstick: NEGATIVE
Ketones, ur: 5 mg/dL — AB
Leukocytes,Ua: NEGATIVE
Nitrite: NEGATIVE
Protein, ur: NEGATIVE mg/dL
Specific Gravity, Urine: 1.013 (ref 1.005–1.030)
pH: 6 (ref 5.0–8.0)

## 2020-11-11 LAB — COMPREHENSIVE METABOLIC PANEL
ALT: <5 U/L (ref 0–44)
AST: 20 U/L (ref 15–41)
Albumin: 3.8 g/dL (ref 3.5–5.0)
Alkaline Phosphatase: 88 U/L (ref 38–126)
Anion gap: 11 (ref 5–15)
BUN: 17 mg/dL (ref 8–23)
CO2: 23 mmol/L (ref 22–32)
Calcium: 9.4 mg/dL (ref 8.9–10.3)
Chloride: 106 mmol/L (ref 98–111)
Creatinine, Ser: 0.79 mg/dL (ref 0.44–1.00)
GFR, Estimated: >60 mL/min (ref >60)
Glucose, Bld: 93 mg/dL (ref 70–99)
Potassium: 4.2 mmol/L (ref 3.5–5.1)
Sodium: 140 mmol/L (ref 135–145)
Total Bilirubin: 0.6 mg/dL (ref 0.3–1.2)
Total Protein: 6.8 g/dL (ref 6.5–8.1)

## 2020-11-11 LAB — URINALYSIS, COMPLETE (UACMP) WITH MICROSCOPIC
Bacteria, UA: NONE SEEN
Bilirubin Urine: NEGATIVE
Glucose, UA: NEGATIVE mg/dL
Hgb urine dipstick: NEGATIVE
Ketones, ur: NEGATIVE mg/dL
Leukocytes,Ua: NEGATIVE
Nitrite: NEGATIVE
Protein, ur: NEGATIVE mg/dL
Specific Gravity, Urine: 1.014 (ref 1.005–1.030)
pH: 6 (ref 5.0–8.0)

## 2020-11-11 LAB — LIPASE, BLOOD: Lipase: 59 U/L — ABNORMAL HIGH (ref 11–51)

## 2020-11-11 LAB — CBC
HCT: 36.1 % (ref 36.0–46.0)
Hemoglobin: 11.8 g/dL — ABNORMAL LOW (ref 12.0–15.0)
MCH: 27.8 pg (ref 26.0–34.0)
MCHC: 32.7 g/dL (ref 30.0–36.0)
MCV: 84.9 fL (ref 80.0–100.0)
Platelets: 155 10*3/uL (ref 150–400)
RBC: 4.25 MIL/uL (ref 3.87–5.11)
RDW: 13.8 % (ref 11.5–15.5)
WBC: 4.1 10*3/uL (ref 4.0–10.5)
nRBC: 0 % (ref 0.0–0.2)

## 2020-11-11 MED ORDER — SODIUM CHLORIDE 0.9 % IV BOLUS
1000.0000 mL | Freq: Once | INTRAVENOUS | Status: AC
  Administered 2020-11-11: 1000 mL via INTRAVENOUS

## 2020-11-11 MED ORDER — CEPHALEXIN 500 MG PO CAPS
500.0000 mg | ORAL_CAPSULE | Freq: Once | ORAL | Status: AC
  Administered 2020-11-11: 500 mg via ORAL
  Filled 2020-11-11: qty 1

## 2020-11-11 MED ORDER — CEPHALEXIN 250 MG PO CAPS: 250.0000 mg | ORAL_CAPSULE | Freq: Four times a day (QID) | ORAL | 0 refills | Status: DC

## 2020-11-11 NOTE — ED Triage Notes (Addendum)
Pt arrived via EMS, from home, AMS. Family called, states that pt was altered from baseline, confused on and off. Malodorous urine per family. Aox2 in triage,

## 2020-11-11 NOTE — ED Notes (Signed)
Dr at bedside.

## 2020-11-11 NOTE — ED Provider Notes (Signed)
[facility] DEPT Provider Note   CSN: [id number] Arrival date & time: 11/11/20  1423     History Chief Complaint  Patient presents with  . Altered Mental Status    [name] is a 87 y.o. female.  Patient has a history of Parkinson's disease.  Her husband states that her urine for the last couple days has been smelling bad and she has had confusion before with urinary tract infection.  She has been mildly confused for the last couple days.  No fever no cough no vomiting  The history is provided by a relative. No language interpreter was used.  Altered Mental Status Presenting symptoms: behavior changes   Severity:  Mild Episode history:  Continuous Timing:  Constant Progression:  Improving Chronicity:  Recurrent Context: dementia   Associated symptoms: no abdominal pain, no hallucinations, no headaches, no rash and no seizures        Past Medical History:  Diagnosis Date  . Anxiety   . Asthma   . Depression   . Diabetes mellitus without complication ([place])    0/27/25.Marland KitchenMarland Kitchenpt denies  . GERD (gastroesophageal reflux disease)   . Hearing loss   . Hypertension   . NHL (non-Hodgkin's lymphoma) ([place])    nhl dx 9/04 breast ca dx1/12  . Parkinson disease [facility])     Patient Active Problem List   Diagnosis Date Noted  . GERD (gastroesophageal reflux disease)   . Depression   . Acute metabolic encephalopathy   . Leukopenia   . Recurrent left pleural effusion   . Weakness   . Palliative care by specialist   . Goals of care, counseling/discussion   . DNR (do not resuscitate)   . Hemothorax 01/23/2020  . Multiple rib fractures 01/23/2020  . Lumbar transverse process fracture ([place]) 01/23/2020  . Hypertensive urgency 01/23/2020  . Anemia 01/23/2020  . Closed fracture of right proximal humerus 05/31/2019  . GIB (gastrointestinal bleeding) 02/03/2017  . CAP (community acquired pneumonia) 04/13/2016  . PNA (pneumonia) 04/13/2016  .  Murmur 09/02/2015  . Parkinson's disease ([place]) 09/02/2015  . Bilateral carotid bruits 09/02/2015  . Confusion 08/07/2015  . Acute encephalopathy 08/07/2015  . Lower urinary tract infectious disease 08/07/2015  . Acute kidney failure ([place]) 08/07/2015  . Paralysis agitans ([place]) 09/19/2012  . Lymphoma ([place]) 07/03/2011  . Malignant neoplasm of female breast ([place]) 07/03/2011  . Abnormal CT of the chest 03/13/2011  . HYPERTENSION, BENIGN 01/01/2009  . EDEMA 01/01/2009    Past Surgical History:  Procedure Laterality Date  . Ba-HA Ear implant    . ESOPHAGOGASTRODUODENOSCOPY N/A 02/04/2017   Procedure: ESOPHAGOGASTRODUODENOSCOPY (EGD);  Surgeon: Arta Silence, MD;  Location: [facility];  Service: Endoscopy;  Laterality: N/A;  . IR THORACENTESIS ASP PLEURAL SPACE W/IMG GUIDE  01/30/2020  . MASTECTOMY  2 /8/ 12   bilateral  . MASTECTOMY       OB History   No obstetric history on file.     Family History  Problem Relation Age of Onset  . Lung cancer Father   . Cancer Father        lung  . Cancer Mother     Social History   Tobacco Use  . Smoking status: Never Smoker  . Smokeless tobacco: Never Used  . Tobacco comment: husband wsa smoker  Vaping Use  . Vaping Use: Never used  Substance Use Topics  . Alcohol use: No    Comment: no  . Drug use: No    Home  Medications Prior to Admission medications   Medication Sig Start Date End Date Taking? Authorizing Provider  cephALEXin (KEFLEX) 250 MG capsule Take 1 capsule (250 mg total) by mouth 4 (four) times daily. 11/11/20  Yes Milton Ferguson, MD  acetaminophen (TYLENOL) 325 MG tablet Take 650 mg by mouth every 6 (six) hours as needed for mild pain, moderate pain, fever or headache.     [provider]  acyclovir (ZOVIRAX) 400 MG tablet TAKE 1 TABLET BY MOUTH TWICE DAILY 04/24/20   [name], MD  Calcium Carbonate (CALCIUM-CARB 600 PO) Take 600 mg by mouth daily.    [provider]  carbidopa-levodopa  (SINEMET IR) 25-100 MG tablet TAKE 1 TABLET BY MOUTH DAILY EVERY MORNING, 1 IN THE AFTERNOON, AND 2 IN THE EVENING Patient taking differently: Take 1-2 tablets by mouth See admin instructions. Take 1 tablet in the morning and 1 tablet in the afternoon and 2 tablets in the evening. 12/19/19   Tat, Eustace Quail, DO  cholecalciferol (VITAMIN D) 1000 units tablet Take 1,000 Units by mouth every evening.     [provider]  docusate sodium (COLACE) 100 MG capsule Take 100 mg by mouth daily as needed for mild constipation.    [provider]  Multiple Vitamins-Minerals (MULTIVITAMIN WITH MINERALS) tablet Take 1 tablet by mouth daily with breakfast.    [provider]  pantoprazole (PROTONIX) 40 MG tablet Take 1 tablet (40 mg total) by mouth daily. 02/03/20   Shelly Coss, MD  sertraline (ZOLOFT) 100 MG tablet Take 1 tablet (100 mg total) by mouth daily. 03/04/18   TatEustace Quail, DO    Allergies    Azithromycin and Tramadol  Review of Systems   Review of Systems  Constitutional: Negative for appetite change and fatigue.  HENT: Negative for congestion, ear discharge and sinus pressure.   Eyes: Negative for discharge.  Respiratory: Negative for cough.   Cardiovascular: Negative for chest pain.  Gastrointestinal: Negative for abdominal pain and diarrhea.  Genitourinary: Negative for frequency and hematuria.  Musculoskeletal: Negative for back pain.  Skin: Negative for rash.  Neurological: Negative for seizures and headaches.  Psychiatric/Behavioral: Negative for hallucinations.    Physical Exam Updated Vital Signs BP (!) 179/107   Pulse 79   Temp 98.9 F (37.2 C) (Oral)   Resp 18   LMP  (LMP Unknown)   SpO2 94%   Physical Exam Constitutional:      Appearance: Normal appearance. She is well-developed.  HENT:     Head: Normocephalic.     Nose: Nose normal.  Eyes:     General: No scleral icterus.    Conjunctiva/sclera: Conjunctivae normal.  Neck:      Thyroid: No thyromegaly.  Cardiovascular:     Rate and Rhythm: Normal rate and regular rhythm.     Heart sounds: No murmur heard. No friction rub. No gallop.   Pulmonary:     Breath sounds: No stridor. No wheezing or rales.  Chest:     Chest wall: No tenderness.  Abdominal:     General: There is no distension.     Tenderness: There is no abdominal tenderness. There is no rebound.  Musculoskeletal:        General: Normal range of motion.     Cervical back: Neck supple.  Lymphadenopathy:     Cervical: No cervical adenopathy.  Skin:    Findings: No erythema or rash.  Neurological:     Mental Status: She is alert.  Motor: No abnormal muscle tone.     Coordination: Coordination normal.     Comments: Oriented to person and place  Psychiatric:        Behavior: Behavior normal.     ED Results / Procedures / Treatments   Labs (all labs ordered are listed, but only abnormal results are displayed) Labs Reviewed  LIPASE, BLOOD - Abnormal; Notable for the following components:      Result Value   Lipase 59 (*)    All other components within normal limits  CBC - Abnormal; Notable for the following components:   Hemoglobin 11.8 (*)    All other components within normal limits  URINALYSIS, ROUTINE W REFLEX MICROSCOPIC - Abnormal; Notable for the following components:   Ketones, ur 5 (*)    All other components within normal limits  URINE CULTURE  COMPREHENSIVE METABOLIC PANEL  URINALYSIS, COMPLETE (UACMP) WITH MICROSCOPIC    EKG None  Radiology CT Head Wo Contrast  Result Date: 11/11/2020 CLINICAL DATA:  Mental status change.  Unknown cause. EXAM: CT HEAD WITHOUT CONTRAST TECHNIQUE: Contiguous axial images were obtained from the base of the skull through the vertex without intravenous contrast. COMPARISON:  CT head 02/18/2020 FINDINGS: Brain: Limited evaluation due to streak artifact originating from bilateral auditory devices. Cerebral ventricle sizes are concordant with the  degree of cerebral volume loss. Patchy and confluent areas of decreased attenuation are noted throughout the deep and periventricular white matter of the cerebral hemispheres bilaterally, compatible with chronic microvascular ischemic disease. No evidence of large-territorial acute infarction. No parenchymal hemorrhage. No mass lesion. No extra-axial collection. No mass effect or midline shift. No hydrocephalus. Basilar cisterns are patent. Vascular: No hyperdense vessel. Skull: No acute fracture or focal lesion. Sinuses/Orbits: Right mastoid air cell and likely middle ear surgical changes. Paranasal sinuses and mastoid air cells are clear. Bilateral lens replacement. Otherwise the orbits are unremarkable. Other: None. IMPRESSION: No acute intracranial abnormality. Electronically Signed   By: Iven Finn M.D.   On: 11/11/2020 17:17   DG Chest Port 1 View  Result Date: 11/11/2020 CLINICAL DATA:  Altered mental status EXAM: PORTABLE CHEST 1 VIEW COMPARISON:  02/18/2020 FINDINGS: Increased interstitial markings without frank interstitial edema. Small left pleural effusion, decreased. No pneumothorax. Cardiomegaly. IMPRESSION: Small left pleural effusion, decreased. No frank interstitial edema. Electronically Signed   By: Julian Hy M.D.   On: 11/11/2020 17:48    Procedures Procedures   Medications Ordered in ED Medications  cephALEXin (KEFLEX) capsule 500 mg (has no administration in time range)  sodium chloride 0.9 % bolus 1,000 mL (0 mLs Intravenous Stopped 11/11/20 1916)  sodium chloride 0.9 % bolus 1,000 mL (0 mLs Intravenous Stopped 11/11/20 1916)    ED Course  I have reviewed the triage vital signs and the nursing notes.  Pertinent labs & imaging results that were available during my care of the patient were reviewed by me and considered in my medical decision making (see chart for details).    MDM Rules/Calculators/A&P                          Patient nontoxic.  We will  empirically treat her with Keflex and get a urine culture and have her follow-up with her primary care doctor in the next few days.  Labs and CT of the head unremarkable.  Could be just worsening of the Parkinson's dementia also Final Clinical Impression(s) / ED Diagnoses Final diagnoses:  Confusion  Rx / DC Orders ED Discharge Orders         Ordered    cephALEXin (KEFLEX) 250 MG capsule  4 times daily        11/11/20 2024           Milton Ferguson, MD 11/12/20 1024

## 2020-11-11 NOTE — Discharge Instructions (Signed)
Follow-up with your family doctor and 2 to 3 days to follow-up on the urine culture.  Return if any problems

## 2020-11-14 DIAGNOSIS — N1832 Chronic kidney disease, stage 3b: Secondary | ICD-10-CM | POA: Diagnosis not present

## 2020-11-14 DIAGNOSIS — S32029D Unspecified fracture of second lumbar vertebra, subsequent encounter for fracture with routine healing: Secondary | ICD-10-CM | POA: Diagnosis not present

## 2020-11-14 DIAGNOSIS — D631 Anemia in chronic kidney disease: Secondary | ICD-10-CM | POA: Diagnosis not present

## 2020-11-14 DIAGNOSIS — I129 Hypertensive chronic kidney disease with stage 1 through stage 4 chronic kidney disease, or unspecified chronic kidney disease: Secondary | ICD-10-CM | POA: Diagnosis not present

## 2020-11-14 DIAGNOSIS — S32019D Unspecified fracture of first lumbar vertebra, subsequent encounter for fracture with routine healing: Secondary | ICD-10-CM | POA: Diagnosis not present

## 2020-11-14 DIAGNOSIS — S2242XD Multiple fractures of ribs, left side, subsequent encounter for fracture with routine healing: Secondary | ICD-10-CM | POA: Diagnosis not present

## 2020-11-14 LAB — URINE CULTURE: Culture: 20000 — AB

## 2020-11-15 ENCOUNTER — Telehealth: Payer: Self-pay | Admitting: Emergency Medicine

## 2020-11-15 DIAGNOSIS — G9341 Metabolic encephalopathy: Secondary | ICD-10-CM | POA: Diagnosis not present

## 2020-11-15 DIAGNOSIS — Z8572 Personal history of non-Hodgkin lymphomas: Secondary | ICD-10-CM | POA: Diagnosis not present

## 2020-11-15 DIAGNOSIS — I350 Nonrheumatic aortic (valve) stenosis: Secondary | ICD-10-CM | POA: Diagnosis not present

## 2020-11-15 DIAGNOSIS — Z853 Personal history of malignant neoplasm of breast: Secondary | ICD-10-CM | POA: Diagnosis not present

## 2020-11-15 DIAGNOSIS — I129 Hypertensive chronic kidney disease with stage 1 through stage 4 chronic kidney disease, or unspecified chronic kidney disease: Secondary | ICD-10-CM | POA: Diagnosis not present

## 2020-11-15 DIAGNOSIS — R32 Unspecified urinary incontinence: Secondary | ICD-10-CM | POA: Diagnosis not present

## 2020-11-15 DIAGNOSIS — R339 Retention of urine, unspecified: Secondary | ICD-10-CM | POA: Diagnosis not present

## 2020-11-15 DIAGNOSIS — S2242XD Multiple fractures of ribs, left side, subsequent encounter for fracture with routine healing: Secondary | ICD-10-CM | POA: Diagnosis not present

## 2020-11-15 DIAGNOSIS — R1310 Dysphagia, unspecified: Secondary | ICD-10-CM | POA: Diagnosis not present

## 2020-11-15 DIAGNOSIS — F0280 Dementia in other diseases classified elsewhere without behavioral disturbance: Secondary | ICD-10-CM | POA: Diagnosis not present

## 2020-11-15 DIAGNOSIS — D631 Anemia in chronic kidney disease: Secondary | ICD-10-CM | POA: Diagnosis not present

## 2020-11-15 DIAGNOSIS — H9193 Unspecified hearing loss, bilateral: Secondary | ICD-10-CM | POA: Diagnosis not present

## 2020-11-15 DIAGNOSIS — J45909 Unspecified asthma, uncomplicated: Secondary | ICD-10-CM | POA: Diagnosis not present

## 2020-11-15 DIAGNOSIS — G20 Parkinson's disease: Secondary | ICD-10-CM | POA: Diagnosis not present

## 2020-11-15 DIAGNOSIS — N1832 Chronic kidney disease, stage 3b: Secondary | ICD-10-CM | POA: Diagnosis not present

## 2020-11-15 DIAGNOSIS — S32029D Unspecified fracture of second lumbar vertebra, subsequent encounter for fracture with routine healing: Secondary | ICD-10-CM | POA: Diagnosis not present

## 2020-11-15 DIAGNOSIS — S32019D Unspecified fracture of first lumbar vertebra, subsequent encounter for fracture with routine healing: Secondary | ICD-10-CM | POA: Diagnosis not present

## 2020-11-15 DIAGNOSIS — Z9013 Acquired absence of bilateral breasts and nipples: Secondary | ICD-10-CM | POA: Diagnosis not present

## 2020-11-15 DIAGNOSIS — K219 Gastro-esophageal reflux disease without esophagitis: Secondary | ICD-10-CM | POA: Diagnosis not present

## 2020-11-15 DIAGNOSIS — F329 Major depressive disorder, single episode, unspecified: Secondary | ICD-10-CM | POA: Diagnosis not present

## 2020-11-15 NOTE — Telephone Encounter (Signed)
Post ED Visit - Positive Culture Follow-up  Culture report reviewed by antimicrobial stewardship pharmacist: [place] Team []  Elenor Quinones, Pharm.D. []  Heide Guile, Pharm.D., BCPS AQ-ID []  Parks Neptune, Pharm.D., BCPS []  Alycia Rossetti, Pharm.D., BCPS []  [name], Pharm.D., BCPS, AAHIVP []  Legrand Como, Pharm.D., BCPS, AAHIVP []  Salome Arnt, PharmD, BCPS []  Johnnette Gourd, PharmD, BCPS []  Hughes Better, PharmD, BCPS []  Leeroy Cha, PharmD []  Laqueta Linden, PharmD, BCPS []  Albertina Parr, PharmD  [place] Team []  Leodis Sias, PharmD []  Lindell Spar, PharmD []  Royetta Asal, PharmD []  Graylin Shiver, Rph []  Rema Fendt) Glennon Mac, PharmD []  Arlyn Dunning, PharmD []  Netta Cedars, PharmD []  Dia Sitter, PharmD []  Leone Haven, PharmD []  Gretta Arab, PharmD []  Theodis Shove, PharmD []  Peggyann Juba, PharmD []  Reuel Boom, PharmD   Positive urine culture Treated with cephalexin, organism sensitive to the same and no further patient follow-up is required at this time.  Hazle Nordmann 11/15/2020, 9:04 AM

## 2020-11-19 DIAGNOSIS — S32029D Unspecified fracture of second lumbar vertebra, subsequent encounter for fracture with routine healing: Secondary | ICD-10-CM | POA: Diagnosis not present

## 2020-11-19 DIAGNOSIS — I129 Hypertensive chronic kidney disease with stage 1 through stage 4 chronic kidney disease, or unspecified chronic kidney disease: Secondary | ICD-10-CM | POA: Diagnosis not present

## 2020-11-19 DIAGNOSIS — S32019D Unspecified fracture of first lumbar vertebra, subsequent encounter for fracture with routine healing: Secondary | ICD-10-CM | POA: Diagnosis not present

## 2020-11-19 DIAGNOSIS — N1832 Chronic kidney disease, stage 3b: Secondary | ICD-10-CM | POA: Diagnosis not present

## 2020-11-19 DIAGNOSIS — D631 Anemia in chronic kidney disease: Secondary | ICD-10-CM | POA: Diagnosis not present

## 2020-11-19 DIAGNOSIS — S2242XD Multiple fractures of ribs, left side, subsequent encounter for fracture with routine healing: Secondary | ICD-10-CM | POA: Diagnosis not present

## 2020-11-21 DIAGNOSIS — D631 Anemia in chronic kidney disease: Secondary | ICD-10-CM | POA: Diagnosis not present

## 2020-11-21 DIAGNOSIS — I129 Hypertensive chronic kidney disease with stage 1 through stage 4 chronic kidney disease, or unspecified chronic kidney disease: Secondary | ICD-10-CM | POA: Diagnosis not present

## 2020-11-21 DIAGNOSIS — N1832 Chronic kidney disease, stage 3b: Secondary | ICD-10-CM | POA: Diagnosis not present

## 2020-11-21 DIAGNOSIS — S2242XD Multiple fractures of ribs, left side, subsequent encounter for fracture with routine healing: Secondary | ICD-10-CM | POA: Diagnosis not present

## 2020-11-21 DIAGNOSIS — S32019D Unspecified fracture of first lumbar vertebra, subsequent encounter for fracture with routine healing: Secondary | ICD-10-CM | POA: Diagnosis not present

## 2020-11-21 DIAGNOSIS — S32029D Unspecified fracture of second lumbar vertebra, subsequent encounter for fracture with routine healing: Secondary | ICD-10-CM | POA: Diagnosis not present

## 2020-11-25 ENCOUNTER — Inpatient Hospital Stay: Payer: Medicare Other

## 2020-11-25 ENCOUNTER — Other Ambulatory Visit: Payer: Medicare Other

## 2020-11-25 ENCOUNTER — Inpatient Hospital Stay: Payer: Medicare Other | Attending: Oncology | Admitting: Oncology

## 2020-11-25 DIAGNOSIS — I129 Hypertensive chronic kidney disease with stage 1 through stage 4 chronic kidney disease, or unspecified chronic kidney disease: Secondary | ICD-10-CM | POA: Diagnosis not present

## 2020-11-25 DIAGNOSIS — S2242XD Multiple fractures of ribs, left side, subsequent encounter for fracture with routine healing: Secondary | ICD-10-CM | POA: Diagnosis not present

## 2020-11-25 DIAGNOSIS — D631 Anemia in chronic kidney disease: Secondary | ICD-10-CM | POA: Diagnosis not present

## 2020-11-25 DIAGNOSIS — S32029D Unspecified fracture of second lumbar vertebra, subsequent encounter for fracture with routine healing: Secondary | ICD-10-CM | POA: Diagnosis not present

## 2020-11-25 DIAGNOSIS — N1832 Chronic kidney disease, stage 3b: Secondary | ICD-10-CM | POA: Diagnosis not present

## 2020-11-25 DIAGNOSIS — S32019D Unspecified fracture of first lumbar vertebra, subsequent encounter for fracture with routine healing: Secondary | ICD-10-CM | POA: Diagnosis not present

## 2020-11-28 DIAGNOSIS — S2242XD Multiple fractures of ribs, left side, subsequent encounter for fracture with routine healing: Secondary | ICD-10-CM | POA: Diagnosis not present

## 2020-11-28 DIAGNOSIS — N1832 Chronic kidney disease, stage 3b: Secondary | ICD-10-CM | POA: Diagnosis not present

## 2020-11-28 DIAGNOSIS — S32029D Unspecified fracture of second lumbar vertebra, subsequent encounter for fracture with routine healing: Secondary | ICD-10-CM | POA: Diagnosis not present

## 2020-11-28 DIAGNOSIS — D631 Anemia in chronic kidney disease: Secondary | ICD-10-CM | POA: Diagnosis not present

## 2020-11-28 DIAGNOSIS — I129 Hypertensive chronic kidney disease with stage 1 through stage 4 chronic kidney disease, or unspecified chronic kidney disease: Secondary | ICD-10-CM | POA: Diagnosis not present

## 2020-11-28 DIAGNOSIS — S32019D Unspecified fracture of first lumbar vertebra, subsequent encounter for fracture with routine healing: Secondary | ICD-10-CM | POA: Diagnosis not present

## 2020-12-03 DIAGNOSIS — I129 Hypertensive chronic kidney disease with stage 1 through stage 4 chronic kidney disease, or unspecified chronic kidney disease: Secondary | ICD-10-CM | POA: Diagnosis not present

## 2020-12-03 DIAGNOSIS — D631 Anemia in chronic kidney disease: Secondary | ICD-10-CM | POA: Diagnosis not present

## 2020-12-03 DIAGNOSIS — S2242XD Multiple fractures of ribs, left side, subsequent encounter for fracture with routine healing: Secondary | ICD-10-CM | POA: Diagnosis not present

## 2020-12-03 DIAGNOSIS — S32019D Unspecified fracture of first lumbar vertebra, subsequent encounter for fracture with routine healing: Secondary | ICD-10-CM | POA: Diagnosis not present

## 2020-12-03 DIAGNOSIS — N1832 Chronic kidney disease, stage 3b: Secondary | ICD-10-CM | POA: Diagnosis not present

## 2020-12-03 DIAGNOSIS — S32029D Unspecified fracture of second lumbar vertebra, subsequent encounter for fracture with routine healing: Secondary | ICD-10-CM | POA: Diagnosis not present

## 2020-12-05 DIAGNOSIS — S2242XD Multiple fractures of ribs, left side, subsequent encounter for fracture with routine healing: Secondary | ICD-10-CM | POA: Diagnosis not present

## 2020-12-05 DIAGNOSIS — S32029D Unspecified fracture of second lumbar vertebra, subsequent encounter for fracture with routine healing: Secondary | ICD-10-CM | POA: Diagnosis not present

## 2020-12-05 DIAGNOSIS — D631 Anemia in chronic kidney disease: Secondary | ICD-10-CM | POA: Diagnosis not present

## 2020-12-05 DIAGNOSIS — N1832 Chronic kidney disease, stage 3b: Secondary | ICD-10-CM | POA: Diagnosis not present

## 2020-12-05 DIAGNOSIS — S32019D Unspecified fracture of first lumbar vertebra, subsequent encounter for fracture with routine healing: Secondary | ICD-10-CM | POA: Diagnosis not present

## 2020-12-05 DIAGNOSIS — I129 Hypertensive chronic kidney disease with stage 1 through stage 4 chronic kidney disease, or unspecified chronic kidney disease: Secondary | ICD-10-CM | POA: Diagnosis not present

## 2020-12-09 DIAGNOSIS — S32019D Unspecified fracture of first lumbar vertebra, subsequent encounter for fracture with routine healing: Secondary | ICD-10-CM | POA: Diagnosis not present

## 2020-12-09 DIAGNOSIS — S2242XD Multiple fractures of ribs, left side, subsequent encounter for fracture with routine healing: Secondary | ICD-10-CM | POA: Diagnosis not present

## 2020-12-09 DIAGNOSIS — N1832 Chronic kidney disease, stage 3b: Secondary | ICD-10-CM | POA: Diagnosis not present

## 2020-12-09 DIAGNOSIS — D631 Anemia in chronic kidney disease: Secondary | ICD-10-CM | POA: Diagnosis not present

## 2020-12-09 DIAGNOSIS — S32029D Unspecified fracture of second lumbar vertebra, subsequent encounter for fracture with routine healing: Secondary | ICD-10-CM | POA: Diagnosis not present

## 2020-12-09 DIAGNOSIS — I129 Hypertensive chronic kidney disease with stage 1 through stage 4 chronic kidney disease, or unspecified chronic kidney disease: Secondary | ICD-10-CM | POA: Diagnosis not present

## 2020-12-12 DIAGNOSIS — I129 Hypertensive chronic kidney disease with stage 1 through stage 4 chronic kidney disease, or unspecified chronic kidney disease: Secondary | ICD-10-CM | POA: Diagnosis not present

## 2020-12-12 DIAGNOSIS — S32019D Unspecified fracture of first lumbar vertebra, subsequent encounter for fracture with routine healing: Secondary | ICD-10-CM | POA: Diagnosis not present

## 2020-12-12 DIAGNOSIS — S32029D Unspecified fracture of second lumbar vertebra, subsequent encounter for fracture with routine healing: Secondary | ICD-10-CM | POA: Diagnosis not present

## 2020-12-12 DIAGNOSIS — N1832 Chronic kidney disease, stage 3b: Secondary | ICD-10-CM | POA: Diagnosis not present

## 2020-12-12 DIAGNOSIS — S2242XD Multiple fractures of ribs, left side, subsequent encounter for fracture with routine healing: Secondary | ICD-10-CM | POA: Diagnosis not present

## 2020-12-12 DIAGNOSIS — D631 Anemia in chronic kidney disease: Secondary | ICD-10-CM | POA: Diagnosis not present

## 2020-12-15 DIAGNOSIS — S32019D Unspecified fracture of first lumbar vertebra, subsequent encounter for fracture with routine healing: Secondary | ICD-10-CM | POA: Diagnosis not present

## 2020-12-15 DIAGNOSIS — K219 Gastro-esophageal reflux disease without esophagitis: Secondary | ICD-10-CM | POA: Diagnosis not present

## 2020-12-15 DIAGNOSIS — Z853 Personal history of malignant neoplasm of breast: Secondary | ICD-10-CM | POA: Diagnosis not present

## 2020-12-15 DIAGNOSIS — S32029D Unspecified fracture of second lumbar vertebra, subsequent encounter for fracture with routine healing: Secondary | ICD-10-CM | POA: Diagnosis not present

## 2020-12-15 DIAGNOSIS — F329 Major depressive disorder, single episode, unspecified: Secondary | ICD-10-CM | POA: Diagnosis not present

## 2020-12-15 DIAGNOSIS — F0280 Dementia in other diseases classified elsewhere without behavioral disturbance: Secondary | ICD-10-CM | POA: Diagnosis not present

## 2020-12-15 DIAGNOSIS — D631 Anemia in chronic kidney disease: Secondary | ICD-10-CM | POA: Diagnosis not present

## 2020-12-15 DIAGNOSIS — Z9013 Acquired absence of bilateral breasts and nipples: Secondary | ICD-10-CM | POA: Diagnosis not present

## 2020-12-15 DIAGNOSIS — N1832 Chronic kidney disease, stage 3b: Secondary | ICD-10-CM | POA: Diagnosis not present

## 2020-12-15 DIAGNOSIS — S2242XD Multiple fractures of ribs, left side, subsequent encounter for fracture with routine healing: Secondary | ICD-10-CM | POA: Diagnosis not present

## 2020-12-15 DIAGNOSIS — H9193 Unspecified hearing loss, bilateral: Secondary | ICD-10-CM | POA: Diagnosis not present

## 2020-12-15 DIAGNOSIS — G9341 Metabolic encephalopathy: Secondary | ICD-10-CM | POA: Diagnosis not present

## 2020-12-15 DIAGNOSIS — R1310 Dysphagia, unspecified: Secondary | ICD-10-CM | POA: Diagnosis not present

## 2020-12-15 DIAGNOSIS — R32 Unspecified urinary incontinence: Secondary | ICD-10-CM | POA: Diagnosis not present

## 2020-12-15 DIAGNOSIS — J45909 Unspecified asthma, uncomplicated: Secondary | ICD-10-CM | POA: Diagnosis not present

## 2020-12-15 DIAGNOSIS — R339 Retention of urine, unspecified: Secondary | ICD-10-CM | POA: Diagnosis not present

## 2020-12-15 DIAGNOSIS — Z8572 Personal history of non-Hodgkin lymphomas: Secondary | ICD-10-CM | POA: Diagnosis not present

## 2020-12-15 DIAGNOSIS — I350 Nonrheumatic aortic (valve) stenosis: Secondary | ICD-10-CM | POA: Diagnosis not present

## 2020-12-15 DIAGNOSIS — G20 Parkinson's disease: Secondary | ICD-10-CM | POA: Diagnosis not present

## 2020-12-15 DIAGNOSIS — I129 Hypertensive chronic kidney disease with stage 1 through stage 4 chronic kidney disease, or unspecified chronic kidney disease: Secondary | ICD-10-CM | POA: Diagnosis not present

## 2020-12-16 DIAGNOSIS — S32019D Unspecified fracture of first lumbar vertebra, subsequent encounter for fracture with routine healing: Secondary | ICD-10-CM | POA: Diagnosis not present

## 2020-12-16 DIAGNOSIS — D631 Anemia in chronic kidney disease: Secondary | ICD-10-CM | POA: Diagnosis not present

## 2020-12-16 DIAGNOSIS — N1832 Chronic kidney disease, stage 3b: Secondary | ICD-10-CM | POA: Diagnosis not present

## 2020-12-16 DIAGNOSIS — S32029D Unspecified fracture of second lumbar vertebra, subsequent encounter for fracture with routine healing: Secondary | ICD-10-CM | POA: Diagnosis not present

## 2020-12-16 DIAGNOSIS — S2242XD Multiple fractures of ribs, left side, subsequent encounter for fracture with routine healing: Secondary | ICD-10-CM | POA: Diagnosis not present

## 2020-12-16 DIAGNOSIS — I129 Hypertensive chronic kidney disease with stage 1 through stage 4 chronic kidney disease, or unspecified chronic kidney disease: Secondary | ICD-10-CM | POA: Diagnosis not present

## 2020-12-19 DIAGNOSIS — D631 Anemia in chronic kidney disease: Secondary | ICD-10-CM | POA: Diagnosis not present

## 2020-12-19 DIAGNOSIS — I129 Hypertensive chronic kidney disease with stage 1 through stage 4 chronic kidney disease, or unspecified chronic kidney disease: Secondary | ICD-10-CM | POA: Diagnosis not present

## 2020-12-19 DIAGNOSIS — N1832 Chronic kidney disease, stage 3b: Secondary | ICD-10-CM | POA: Diagnosis not present

## 2020-12-19 DIAGNOSIS — S32029D Unspecified fracture of second lumbar vertebra, subsequent encounter for fracture with routine healing: Secondary | ICD-10-CM | POA: Diagnosis not present

## 2020-12-19 DIAGNOSIS — S2242XD Multiple fractures of ribs, left side, subsequent encounter for fracture with routine healing: Secondary | ICD-10-CM | POA: Diagnosis not present

## 2020-12-19 DIAGNOSIS — S32019D Unspecified fracture of first lumbar vertebra, subsequent encounter for fracture with routine healing: Secondary | ICD-10-CM | POA: Diagnosis not present

## 2020-12-25 DIAGNOSIS — D631 Anemia in chronic kidney disease: Secondary | ICD-10-CM | POA: Diagnosis not present

## 2020-12-25 DIAGNOSIS — I129 Hypertensive chronic kidney disease with stage 1 through stage 4 chronic kidney disease, or unspecified chronic kidney disease: Secondary | ICD-10-CM | POA: Diagnosis not present

## 2020-12-25 DIAGNOSIS — S2242XD Multiple fractures of ribs, left side, subsequent encounter for fracture with routine healing: Secondary | ICD-10-CM | POA: Diagnosis not present

## 2020-12-25 DIAGNOSIS — S32019D Unspecified fracture of first lumbar vertebra, subsequent encounter for fracture with routine healing: Secondary | ICD-10-CM | POA: Diagnosis not present

## 2020-12-25 DIAGNOSIS — N1832 Chronic kidney disease, stage 3b: Secondary | ICD-10-CM | POA: Diagnosis not present

## 2020-12-25 DIAGNOSIS — S32029D Unspecified fracture of second lumbar vertebra, subsequent encounter for fracture with routine healing: Secondary | ICD-10-CM | POA: Diagnosis not present

## 2020-12-26 DIAGNOSIS — S32029D Unspecified fracture of second lumbar vertebra, subsequent encounter for fracture with routine healing: Secondary | ICD-10-CM | POA: Diagnosis not present

## 2020-12-26 DIAGNOSIS — S2242XD Multiple fractures of ribs, left side, subsequent encounter for fracture with routine healing: Secondary | ICD-10-CM | POA: Diagnosis not present

## 2020-12-26 DIAGNOSIS — D631 Anemia in chronic kidney disease: Secondary | ICD-10-CM | POA: Diagnosis not present

## 2020-12-26 DIAGNOSIS — S32019D Unspecified fracture of first lumbar vertebra, subsequent encounter for fracture with routine healing: Secondary | ICD-10-CM | POA: Diagnosis not present

## 2020-12-26 DIAGNOSIS — N1832 Chronic kidney disease, stage 3b: Secondary | ICD-10-CM | POA: Diagnosis not present

## 2020-12-26 DIAGNOSIS — I129 Hypertensive chronic kidney disease with stage 1 through stage 4 chronic kidney disease, or unspecified chronic kidney disease: Secondary | ICD-10-CM | POA: Diagnosis not present

## 2020-12-30 DIAGNOSIS — H0100A Unspecified blepharitis right eye, upper and lower eyelids: Secondary | ICD-10-CM | POA: Diagnosis not present

## 2020-12-30 DIAGNOSIS — H353131 Nonexudative age-related macular degeneration, bilateral, early dry stage: Secondary | ICD-10-CM | POA: Diagnosis not present

## 2020-12-30 DIAGNOSIS — H35371 Puckering of macula, right eye: Secondary | ICD-10-CM | POA: Diagnosis not present

## 2020-12-30 DIAGNOSIS — Z961 Presence of intraocular lens: Secondary | ICD-10-CM | POA: Diagnosis not present

## 2021-01-01 DIAGNOSIS — H906 Mixed conductive and sensorineural hearing loss, bilateral: Secondary | ICD-10-CM | POA: Diagnosis not present

## 2021-01-02 DIAGNOSIS — N1832 Chronic kidney disease, stage 3b: Secondary | ICD-10-CM | POA: Diagnosis not present

## 2021-01-02 DIAGNOSIS — S32019D Unspecified fracture of first lumbar vertebra, subsequent encounter for fracture with routine healing: Secondary | ICD-10-CM | POA: Diagnosis not present

## 2021-01-02 DIAGNOSIS — S32029D Unspecified fracture of second lumbar vertebra, subsequent encounter for fracture with routine healing: Secondary | ICD-10-CM | POA: Diagnosis not present

## 2021-01-02 DIAGNOSIS — S2242XD Multiple fractures of ribs, left side, subsequent encounter for fracture with routine healing: Secondary | ICD-10-CM | POA: Diagnosis not present

## 2021-01-02 DIAGNOSIS — D631 Anemia in chronic kidney disease: Secondary | ICD-10-CM | POA: Diagnosis not present

## 2021-01-02 DIAGNOSIS — I129 Hypertensive chronic kidney disease with stage 1 through stage 4 chronic kidney disease, or unspecified chronic kidney disease: Secondary | ICD-10-CM | POA: Diagnosis not present

## 2021-01-09 DIAGNOSIS — F0280 Dementia in other diseases classified elsewhere without behavioral disturbance: Secondary | ICD-10-CM | POA: Diagnosis not present

## 2021-01-09 DIAGNOSIS — D500 Iron deficiency anemia secondary to blood loss (chronic): Secondary | ICD-10-CM | POA: Diagnosis not present

## 2021-01-09 DIAGNOSIS — S32029D Unspecified fracture of second lumbar vertebra, subsequent encounter for fracture with routine healing: Secondary | ICD-10-CM | POA: Diagnosis not present

## 2021-01-09 DIAGNOSIS — N1832 Chronic kidney disease, stage 3b: Secondary | ICD-10-CM | POA: Diagnosis not present

## 2021-01-09 DIAGNOSIS — M159 Polyosteoarthritis, unspecified: Secondary | ICD-10-CM | POA: Diagnosis not present

## 2021-01-09 DIAGNOSIS — N183 Chronic kidney disease, stage 3 unspecified: Secondary | ICD-10-CM | POA: Diagnosis not present

## 2021-01-09 DIAGNOSIS — D631 Anemia in chronic kidney disease: Secondary | ICD-10-CM | POA: Diagnosis not present

## 2021-01-09 DIAGNOSIS — F419 Anxiety disorder, unspecified: Secondary | ICD-10-CM | POA: Diagnosis not present

## 2021-01-09 DIAGNOSIS — S32019D Unspecified fracture of first lumbar vertebra, subsequent encounter for fracture with routine healing: Secondary | ICD-10-CM | POA: Diagnosis not present

## 2021-01-09 DIAGNOSIS — I129 Hypertensive chronic kidney disease with stage 1 through stage 4 chronic kidney disease, or unspecified chronic kidney disease: Secondary | ICD-10-CM | POA: Diagnosis not present

## 2021-01-09 DIAGNOSIS — G20 Parkinson's disease: Secondary | ICD-10-CM | POA: Diagnosis not present

## 2021-01-09 DIAGNOSIS — S2242XD Multiple fractures of ribs, left side, subsequent encounter for fracture with routine healing: Secondary | ICD-10-CM | POA: Diagnosis not present

## 2021-01-10 DIAGNOSIS — S32019D Unspecified fracture of first lumbar vertebra, subsequent encounter for fracture with routine healing: Secondary | ICD-10-CM | POA: Diagnosis not present

## 2021-01-10 DIAGNOSIS — S32029D Unspecified fracture of second lumbar vertebra, subsequent encounter for fracture with routine healing: Secondary | ICD-10-CM | POA: Diagnosis not present

## 2021-01-10 DIAGNOSIS — I129 Hypertensive chronic kidney disease with stage 1 through stage 4 chronic kidney disease, or unspecified chronic kidney disease: Secondary | ICD-10-CM | POA: Diagnosis not present

## 2021-01-10 DIAGNOSIS — N1832 Chronic kidney disease, stage 3b: Secondary | ICD-10-CM | POA: Diagnosis not present

## 2021-01-10 DIAGNOSIS — D631 Anemia in chronic kidney disease: Secondary | ICD-10-CM | POA: Diagnosis not present

## 2021-01-10 DIAGNOSIS — S2242XD Multiple fractures of ribs, left side, subsequent encounter for fracture with routine healing: Secondary | ICD-10-CM | POA: Diagnosis not present

## 2021-01-14 DIAGNOSIS — S32029D Unspecified fracture of second lumbar vertebra, subsequent encounter for fracture with routine healing: Secondary | ICD-10-CM | POA: Diagnosis not present

## 2021-01-14 DIAGNOSIS — K219 Gastro-esophageal reflux disease without esophagitis: Secondary | ICD-10-CM | POA: Diagnosis not present

## 2021-01-14 DIAGNOSIS — R32 Unspecified urinary incontinence: Secondary | ICD-10-CM | POA: Diagnosis not present

## 2021-01-14 DIAGNOSIS — R1310 Dysphagia, unspecified: Secondary | ICD-10-CM | POA: Diagnosis not present

## 2021-01-14 DIAGNOSIS — I350 Nonrheumatic aortic (valve) stenosis: Secondary | ICD-10-CM | POA: Diagnosis not present

## 2021-01-14 DIAGNOSIS — F329 Major depressive disorder, single episode, unspecified: Secondary | ICD-10-CM | POA: Diagnosis not present

## 2021-01-14 DIAGNOSIS — G9341 Metabolic encephalopathy: Secondary | ICD-10-CM | POA: Diagnosis not present

## 2021-01-14 DIAGNOSIS — S32019D Unspecified fracture of first lumbar vertebra, subsequent encounter for fracture with routine healing: Secondary | ICD-10-CM | POA: Diagnosis not present

## 2021-01-14 DIAGNOSIS — Z9013 Acquired absence of bilateral breasts and nipples: Secondary | ICD-10-CM | POA: Diagnosis not present

## 2021-01-14 DIAGNOSIS — Z8572 Personal history of non-Hodgkin lymphomas: Secondary | ICD-10-CM | POA: Diagnosis not present

## 2021-01-14 DIAGNOSIS — J45909 Unspecified asthma, uncomplicated: Secondary | ICD-10-CM | POA: Diagnosis not present

## 2021-01-14 DIAGNOSIS — S2242XD Multiple fractures of ribs, left side, subsequent encounter for fracture with routine healing: Secondary | ICD-10-CM | POA: Diagnosis not present

## 2021-01-14 DIAGNOSIS — Z853 Personal history of malignant neoplasm of breast: Secondary | ICD-10-CM | POA: Diagnosis not present

## 2021-01-14 DIAGNOSIS — G20 Parkinson's disease: Secondary | ICD-10-CM | POA: Diagnosis not present

## 2021-01-14 DIAGNOSIS — F0280 Dementia in other diseases classified elsewhere without behavioral disturbance: Secondary | ICD-10-CM | POA: Diagnosis not present

## 2021-01-14 DIAGNOSIS — I129 Hypertensive chronic kidney disease with stage 1 through stage 4 chronic kidney disease, or unspecified chronic kidney disease: Secondary | ICD-10-CM | POA: Diagnosis not present

## 2021-01-14 DIAGNOSIS — R339 Retention of urine, unspecified: Secondary | ICD-10-CM | POA: Diagnosis not present

## 2021-01-14 DIAGNOSIS — D631 Anemia in chronic kidney disease: Secondary | ICD-10-CM | POA: Diagnosis not present

## 2021-01-14 DIAGNOSIS — N1832 Chronic kidney disease, stage 3b: Secondary | ICD-10-CM | POA: Diagnosis not present

## 2021-01-14 DIAGNOSIS — H9193 Unspecified hearing loss, bilateral: Secondary | ICD-10-CM | POA: Diagnosis not present

## 2021-01-16 DIAGNOSIS — S2242XD Multiple fractures of ribs, left side, subsequent encounter for fracture with routine healing: Secondary | ICD-10-CM | POA: Diagnosis not present

## 2021-01-16 DIAGNOSIS — S32029D Unspecified fracture of second lumbar vertebra, subsequent encounter for fracture with routine healing: Secondary | ICD-10-CM | POA: Diagnosis not present

## 2021-01-16 DIAGNOSIS — I129 Hypertensive chronic kidney disease with stage 1 through stage 4 chronic kidney disease, or unspecified chronic kidney disease: Secondary | ICD-10-CM | POA: Diagnosis not present

## 2021-01-16 DIAGNOSIS — N1832 Chronic kidney disease, stage 3b: Secondary | ICD-10-CM | POA: Diagnosis not present

## 2021-01-16 DIAGNOSIS — D631 Anemia in chronic kidney disease: Secondary | ICD-10-CM | POA: Diagnosis not present

## 2021-01-16 DIAGNOSIS — S32019D Unspecified fracture of first lumbar vertebra, subsequent encounter for fracture with routine healing: Secondary | ICD-10-CM | POA: Diagnosis not present

## 2021-01-20 DIAGNOSIS — S2242XD Multiple fractures of ribs, left side, subsequent encounter for fracture with routine healing: Secondary | ICD-10-CM | POA: Diagnosis not present

## 2021-01-20 DIAGNOSIS — I129 Hypertensive chronic kidney disease with stage 1 through stage 4 chronic kidney disease, or unspecified chronic kidney disease: Secondary | ICD-10-CM | POA: Diagnosis not present

## 2021-01-20 DIAGNOSIS — N1832 Chronic kidney disease, stage 3b: Secondary | ICD-10-CM | POA: Diagnosis not present

## 2021-01-20 DIAGNOSIS — S32019D Unspecified fracture of first lumbar vertebra, subsequent encounter for fracture with routine healing: Secondary | ICD-10-CM | POA: Diagnosis not present

## 2021-01-20 DIAGNOSIS — D631 Anemia in chronic kidney disease: Secondary | ICD-10-CM | POA: Diagnosis not present

## 2021-01-20 DIAGNOSIS — S32029D Unspecified fracture of second lumbar vertebra, subsequent encounter for fracture with routine healing: Secondary | ICD-10-CM | POA: Diagnosis not present

## 2021-01-28 DIAGNOSIS — N1832 Chronic kidney disease, stage 3b: Secondary | ICD-10-CM | POA: Diagnosis not present

## 2021-01-28 DIAGNOSIS — S32029D Unspecified fracture of second lumbar vertebra, subsequent encounter for fracture with routine healing: Secondary | ICD-10-CM | POA: Diagnosis not present

## 2021-01-28 DIAGNOSIS — S32019D Unspecified fracture of first lumbar vertebra, subsequent encounter for fracture with routine healing: Secondary | ICD-10-CM | POA: Diagnosis not present

## 2021-01-28 DIAGNOSIS — I129 Hypertensive chronic kidney disease with stage 1 through stage 4 chronic kidney disease, or unspecified chronic kidney disease: Secondary | ICD-10-CM | POA: Diagnosis not present

## 2021-01-28 DIAGNOSIS — D631 Anemia in chronic kidney disease: Secondary | ICD-10-CM | POA: Diagnosis not present

## 2021-01-28 DIAGNOSIS — S2242XD Multiple fractures of ribs, left side, subsequent encounter for fracture with routine healing: Secondary | ICD-10-CM | POA: Diagnosis not present

## 2021-01-30 DIAGNOSIS — S2242XD Multiple fractures of ribs, left side, subsequent encounter for fracture with routine healing: Secondary | ICD-10-CM | POA: Diagnosis not present

## 2021-01-30 DIAGNOSIS — D631 Anemia in chronic kidney disease: Secondary | ICD-10-CM | POA: Diagnosis not present

## 2021-01-30 DIAGNOSIS — S32019D Unspecified fracture of first lumbar vertebra, subsequent encounter for fracture with routine healing: Secondary | ICD-10-CM | POA: Diagnosis not present

## 2021-01-30 DIAGNOSIS — N1832 Chronic kidney disease, stage 3b: Secondary | ICD-10-CM | POA: Diagnosis not present

## 2021-01-30 DIAGNOSIS — I129 Hypertensive chronic kidney disease with stage 1 through stage 4 chronic kidney disease, or unspecified chronic kidney disease: Secondary | ICD-10-CM | POA: Diagnosis not present

## 2021-01-30 DIAGNOSIS — S32029D Unspecified fracture of second lumbar vertebra, subsequent encounter for fracture with routine healing: Secondary | ICD-10-CM | POA: Diagnosis not present

## 2021-02-03 ENCOUNTER — Other Ambulatory Visit: Payer: Self-pay | Admitting: Neurology

## 2021-02-03 ENCOUNTER — Other Ambulatory Visit: Payer: Self-pay | Admitting: Oncology

## 2021-02-04 DIAGNOSIS — S2242XD Multiple fractures of ribs, left side, subsequent encounter for fracture with routine healing: Secondary | ICD-10-CM | POA: Diagnosis not present

## 2021-02-04 DIAGNOSIS — N1832 Chronic kidney disease, stage 3b: Secondary | ICD-10-CM | POA: Diagnosis not present

## 2021-02-04 DIAGNOSIS — S32029D Unspecified fracture of second lumbar vertebra, subsequent encounter for fracture with routine healing: Secondary | ICD-10-CM | POA: Diagnosis not present

## 2021-02-04 DIAGNOSIS — D631 Anemia in chronic kidney disease: Secondary | ICD-10-CM | POA: Diagnosis not present

## 2021-02-04 DIAGNOSIS — I129 Hypertensive chronic kidney disease with stage 1 through stage 4 chronic kidney disease, or unspecified chronic kidney disease: Secondary | ICD-10-CM | POA: Diagnosis not present

## 2021-02-04 DIAGNOSIS — S32019D Unspecified fracture of first lumbar vertebra, subsequent encounter for fracture with routine healing: Secondary | ICD-10-CM | POA: Diagnosis not present

## 2021-02-06 DIAGNOSIS — D631 Anemia in chronic kidney disease: Secondary | ICD-10-CM | POA: Diagnosis not present

## 2021-02-06 DIAGNOSIS — S2242XD Multiple fractures of ribs, left side, subsequent encounter for fracture with routine healing: Secondary | ICD-10-CM | POA: Diagnosis not present

## 2021-02-06 DIAGNOSIS — S32029D Unspecified fracture of second lumbar vertebra, subsequent encounter for fracture with routine healing: Secondary | ICD-10-CM | POA: Diagnosis not present

## 2021-02-06 DIAGNOSIS — I129 Hypertensive chronic kidney disease with stage 1 through stage 4 chronic kidney disease, or unspecified chronic kidney disease: Secondary | ICD-10-CM | POA: Diagnosis not present

## 2021-02-06 DIAGNOSIS — N1832 Chronic kidney disease, stage 3b: Secondary | ICD-10-CM | POA: Diagnosis not present

## 2021-02-06 DIAGNOSIS — S32019D Unspecified fracture of first lumbar vertebra, subsequent encounter for fracture with routine healing: Secondary | ICD-10-CM | POA: Diagnosis not present

## 2021-02-11 DIAGNOSIS — D631 Anemia in chronic kidney disease: Secondary | ICD-10-CM | POA: Diagnosis not present

## 2021-02-11 DIAGNOSIS — S32029D Unspecified fracture of second lumbar vertebra, subsequent encounter for fracture with routine healing: Secondary | ICD-10-CM | POA: Diagnosis not present

## 2021-02-11 DIAGNOSIS — N1832 Chronic kidney disease, stage 3b: Secondary | ICD-10-CM | POA: Diagnosis not present

## 2021-02-11 DIAGNOSIS — S2242XD Multiple fractures of ribs, left side, subsequent encounter for fracture with routine healing: Secondary | ICD-10-CM | POA: Diagnosis not present

## 2021-02-11 DIAGNOSIS — I129 Hypertensive chronic kidney disease with stage 1 through stage 4 chronic kidney disease, or unspecified chronic kidney disease: Secondary | ICD-10-CM | POA: Diagnosis not present

## 2021-02-11 DIAGNOSIS — S32019D Unspecified fracture of first lumbar vertebra, subsequent encounter for fracture with routine healing: Secondary | ICD-10-CM | POA: Diagnosis not present

## 2021-02-13 DIAGNOSIS — K219 Gastro-esophageal reflux disease without esophagitis: Secondary | ICD-10-CM | POA: Diagnosis not present

## 2021-02-13 DIAGNOSIS — I350 Nonrheumatic aortic (valve) stenosis: Secondary | ICD-10-CM | POA: Diagnosis not present

## 2021-02-13 DIAGNOSIS — S32019D Unspecified fracture of first lumbar vertebra, subsequent encounter for fracture with routine healing: Secondary | ICD-10-CM | POA: Diagnosis not present

## 2021-02-13 DIAGNOSIS — H9193 Unspecified hearing loss, bilateral: Secondary | ICD-10-CM | POA: Diagnosis not present

## 2021-02-13 DIAGNOSIS — R339 Retention of urine, unspecified: Secondary | ICD-10-CM | POA: Diagnosis not present

## 2021-02-13 DIAGNOSIS — Z8572 Personal history of non-Hodgkin lymphomas: Secondary | ICD-10-CM | POA: Diagnosis not present

## 2021-02-13 DIAGNOSIS — F0280 Dementia in other diseases classified elsewhere without behavioral disturbance: Secondary | ICD-10-CM | POA: Diagnosis not present

## 2021-02-13 DIAGNOSIS — F329 Major depressive disorder, single episode, unspecified: Secondary | ICD-10-CM | POA: Diagnosis not present

## 2021-02-13 DIAGNOSIS — Z853 Personal history of malignant neoplasm of breast: Secondary | ICD-10-CM | POA: Diagnosis not present

## 2021-02-13 DIAGNOSIS — R1310 Dysphagia, unspecified: Secondary | ICD-10-CM | POA: Diagnosis not present

## 2021-02-13 DIAGNOSIS — I129 Hypertensive chronic kidney disease with stage 1 through stage 4 chronic kidney disease, or unspecified chronic kidney disease: Secondary | ICD-10-CM | POA: Diagnosis not present

## 2021-02-13 DIAGNOSIS — Z9013 Acquired absence of bilateral breasts and nipples: Secondary | ICD-10-CM | POA: Diagnosis not present

## 2021-02-13 DIAGNOSIS — G20 Parkinson's disease: Secondary | ICD-10-CM | POA: Diagnosis not present

## 2021-02-13 DIAGNOSIS — S32029D Unspecified fracture of second lumbar vertebra, subsequent encounter for fracture with routine healing: Secondary | ICD-10-CM | POA: Diagnosis not present

## 2021-02-13 DIAGNOSIS — N1832 Chronic kidney disease, stage 3b: Secondary | ICD-10-CM | POA: Diagnosis not present

## 2021-02-13 DIAGNOSIS — J45909 Unspecified asthma, uncomplicated: Secondary | ICD-10-CM | POA: Diagnosis not present

## 2021-02-13 DIAGNOSIS — D631 Anemia in chronic kidney disease: Secondary | ICD-10-CM | POA: Diagnosis not present

## 2021-02-13 DIAGNOSIS — R32 Unspecified urinary incontinence: Secondary | ICD-10-CM | POA: Diagnosis not present

## 2021-02-13 DIAGNOSIS — S2242XD Multiple fractures of ribs, left side, subsequent encounter for fracture with routine healing: Secondary | ICD-10-CM | POA: Diagnosis not present

## 2021-02-13 DIAGNOSIS — G9341 Metabolic encephalopathy: Secondary | ICD-10-CM | POA: Diagnosis not present

## 2021-02-18 DIAGNOSIS — I129 Hypertensive chronic kidney disease with stage 1 through stage 4 chronic kidney disease, or unspecified chronic kidney disease: Secondary | ICD-10-CM | POA: Diagnosis not present

## 2021-02-18 DIAGNOSIS — S2242XD Multiple fractures of ribs, left side, subsequent encounter for fracture with routine healing: Secondary | ICD-10-CM | POA: Diagnosis not present

## 2021-02-18 DIAGNOSIS — S32029D Unspecified fracture of second lumbar vertebra, subsequent encounter for fracture with routine healing: Secondary | ICD-10-CM | POA: Diagnosis not present

## 2021-02-18 DIAGNOSIS — N1832 Chronic kidney disease, stage 3b: Secondary | ICD-10-CM | POA: Diagnosis not present

## 2021-02-18 DIAGNOSIS — D631 Anemia in chronic kidney disease: Secondary | ICD-10-CM | POA: Diagnosis not present

## 2021-02-18 DIAGNOSIS — S32019D Unspecified fracture of first lumbar vertebra, subsequent encounter for fracture with routine healing: Secondary | ICD-10-CM | POA: Diagnosis not present

## 2021-02-20 DIAGNOSIS — D631 Anemia in chronic kidney disease: Secondary | ICD-10-CM | POA: Diagnosis not present

## 2021-02-20 DIAGNOSIS — S32029D Unspecified fracture of second lumbar vertebra, subsequent encounter for fracture with routine healing: Secondary | ICD-10-CM | POA: Diagnosis not present

## 2021-02-20 DIAGNOSIS — S2242XD Multiple fractures of ribs, left side, subsequent encounter for fracture with routine healing: Secondary | ICD-10-CM | POA: Diagnosis not present

## 2021-02-20 DIAGNOSIS — S32019D Unspecified fracture of first lumbar vertebra, subsequent encounter for fracture with routine healing: Secondary | ICD-10-CM | POA: Diagnosis not present

## 2021-02-20 DIAGNOSIS — I129 Hypertensive chronic kidney disease with stage 1 through stage 4 chronic kidney disease, or unspecified chronic kidney disease: Secondary | ICD-10-CM | POA: Diagnosis not present

## 2021-02-20 DIAGNOSIS — N1832 Chronic kidney disease, stage 3b: Secondary | ICD-10-CM | POA: Diagnosis not present

## 2021-02-26 DIAGNOSIS — I129 Hypertensive chronic kidney disease with stage 1 through stage 4 chronic kidney disease, or unspecified chronic kidney disease: Secondary | ICD-10-CM | POA: Diagnosis not present

## 2021-02-26 DIAGNOSIS — S2242XD Multiple fractures of ribs, left side, subsequent encounter for fracture with routine healing: Secondary | ICD-10-CM | POA: Diagnosis not present

## 2021-02-26 DIAGNOSIS — N1832 Chronic kidney disease, stage 3b: Secondary | ICD-10-CM | POA: Diagnosis not present

## 2021-02-26 DIAGNOSIS — S32029D Unspecified fracture of second lumbar vertebra, subsequent encounter for fracture with routine healing: Secondary | ICD-10-CM | POA: Diagnosis not present

## 2021-02-26 DIAGNOSIS — S32019D Unspecified fracture of first lumbar vertebra, subsequent encounter for fracture with routine healing: Secondary | ICD-10-CM | POA: Diagnosis not present

## 2021-02-26 DIAGNOSIS — D631 Anemia in chronic kidney disease: Secondary | ICD-10-CM | POA: Diagnosis not present

## 2021-02-27 DIAGNOSIS — S32019D Unspecified fracture of first lumbar vertebra, subsequent encounter for fracture with routine healing: Secondary | ICD-10-CM | POA: Diagnosis not present

## 2021-02-27 DIAGNOSIS — I129 Hypertensive chronic kidney disease with stage 1 through stage 4 chronic kidney disease, or unspecified chronic kidney disease: Secondary | ICD-10-CM | POA: Diagnosis not present

## 2021-02-27 DIAGNOSIS — D631 Anemia in chronic kidney disease: Secondary | ICD-10-CM | POA: Diagnosis not present

## 2021-02-27 DIAGNOSIS — S32029D Unspecified fracture of second lumbar vertebra, subsequent encounter for fracture with routine healing: Secondary | ICD-10-CM | POA: Diagnosis not present

## 2021-02-27 DIAGNOSIS — S2242XD Multiple fractures of ribs, left side, subsequent encounter for fracture with routine healing: Secondary | ICD-10-CM | POA: Diagnosis not present

## 2021-02-27 DIAGNOSIS — N1832 Chronic kidney disease, stage 3b: Secondary | ICD-10-CM | POA: Diagnosis not present

## 2021-03-04 DIAGNOSIS — D631 Anemia in chronic kidney disease: Secondary | ICD-10-CM | POA: Diagnosis not present

## 2021-03-04 DIAGNOSIS — I129 Hypertensive chronic kidney disease with stage 1 through stage 4 chronic kidney disease, or unspecified chronic kidney disease: Secondary | ICD-10-CM | POA: Diagnosis not present

## 2021-03-04 DIAGNOSIS — N1832 Chronic kidney disease, stage 3b: Secondary | ICD-10-CM | POA: Diagnosis not present

## 2021-03-04 DIAGNOSIS — S32019D Unspecified fracture of first lumbar vertebra, subsequent encounter for fracture with routine healing: Secondary | ICD-10-CM | POA: Diagnosis not present

## 2021-03-04 DIAGNOSIS — S2242XD Multiple fractures of ribs, left side, subsequent encounter for fracture with routine healing: Secondary | ICD-10-CM | POA: Diagnosis not present

## 2021-03-04 DIAGNOSIS — S32029D Unspecified fracture of second lumbar vertebra, subsequent encounter for fracture with routine healing: Secondary | ICD-10-CM | POA: Diagnosis not present

## 2021-03-06 DIAGNOSIS — S32019D Unspecified fracture of first lumbar vertebra, subsequent encounter for fracture with routine healing: Secondary | ICD-10-CM | POA: Diagnosis not present

## 2021-03-06 DIAGNOSIS — N1832 Chronic kidney disease, stage 3b: Secondary | ICD-10-CM | POA: Diagnosis not present

## 2021-03-06 DIAGNOSIS — S2242XD Multiple fractures of ribs, left side, subsequent encounter for fracture with routine healing: Secondary | ICD-10-CM | POA: Diagnosis not present

## 2021-03-06 DIAGNOSIS — S32029D Unspecified fracture of second lumbar vertebra, subsequent encounter for fracture with routine healing: Secondary | ICD-10-CM | POA: Diagnosis not present

## 2021-03-06 DIAGNOSIS — I129 Hypertensive chronic kidney disease with stage 1 through stage 4 chronic kidney disease, or unspecified chronic kidney disease: Secondary | ICD-10-CM | POA: Diagnosis not present

## 2021-03-06 DIAGNOSIS — D631 Anemia in chronic kidney disease: Secondary | ICD-10-CM | POA: Diagnosis not present

## 2021-03-10 DIAGNOSIS — I129 Hypertensive chronic kidney disease with stage 1 through stage 4 chronic kidney disease, or unspecified chronic kidney disease: Secondary | ICD-10-CM | POA: Diagnosis not present

## 2021-03-10 DIAGNOSIS — S2242XD Multiple fractures of ribs, left side, subsequent encounter for fracture with routine healing: Secondary | ICD-10-CM | POA: Diagnosis not present

## 2021-03-10 DIAGNOSIS — N1832 Chronic kidney disease, stage 3b: Secondary | ICD-10-CM | POA: Diagnosis not present

## 2021-03-10 DIAGNOSIS — S32019D Unspecified fracture of first lumbar vertebra, subsequent encounter for fracture with routine healing: Secondary | ICD-10-CM | POA: Diagnosis not present

## 2021-03-10 DIAGNOSIS — S32029D Unspecified fracture of second lumbar vertebra, subsequent encounter for fracture with routine healing: Secondary | ICD-10-CM | POA: Diagnosis not present

## 2021-03-10 DIAGNOSIS — D631 Anemia in chronic kidney disease: Secondary | ICD-10-CM | POA: Diagnosis not present

## 2021-03-13 DIAGNOSIS — N1832 Chronic kidney disease, stage 3b: Secondary | ICD-10-CM | POA: Diagnosis not present

## 2021-03-13 DIAGNOSIS — S32029D Unspecified fracture of second lumbar vertebra, subsequent encounter for fracture with routine healing: Secondary | ICD-10-CM | POA: Diagnosis not present

## 2021-03-13 DIAGNOSIS — D631 Anemia in chronic kidney disease: Secondary | ICD-10-CM | POA: Diagnosis not present

## 2021-03-13 DIAGNOSIS — S32019D Unspecified fracture of first lumbar vertebra, subsequent encounter for fracture with routine healing: Secondary | ICD-10-CM | POA: Diagnosis not present

## 2021-03-13 DIAGNOSIS — I129 Hypertensive chronic kidney disease with stage 1 through stage 4 chronic kidney disease, or unspecified chronic kidney disease: Secondary | ICD-10-CM | POA: Diagnosis not present

## 2021-03-13 DIAGNOSIS — S2242XD Multiple fractures of ribs, left side, subsequent encounter for fracture with routine healing: Secondary | ICD-10-CM | POA: Diagnosis not present

## 2021-03-15 DIAGNOSIS — Z9013 Acquired absence of bilateral breasts and nipples: Secondary | ICD-10-CM | POA: Diagnosis not present

## 2021-03-15 DIAGNOSIS — S32029D Unspecified fracture of second lumbar vertebra, subsequent encounter for fracture with routine healing: Secondary | ICD-10-CM | POA: Diagnosis not present

## 2021-03-15 DIAGNOSIS — H9193 Unspecified hearing loss, bilateral: Secondary | ICD-10-CM | POA: Diagnosis not present

## 2021-03-15 DIAGNOSIS — G9341 Metabolic encephalopathy: Secondary | ICD-10-CM | POA: Diagnosis not present

## 2021-03-15 DIAGNOSIS — F329 Major depressive disorder, single episode, unspecified: Secondary | ICD-10-CM | POA: Diagnosis not present

## 2021-03-15 DIAGNOSIS — K219 Gastro-esophageal reflux disease without esophagitis: Secondary | ICD-10-CM | POA: Diagnosis not present

## 2021-03-15 DIAGNOSIS — S32019D Unspecified fracture of first lumbar vertebra, subsequent encounter for fracture with routine healing: Secondary | ICD-10-CM | POA: Diagnosis not present

## 2021-03-15 DIAGNOSIS — N1832 Chronic kidney disease, stage 3b: Secondary | ICD-10-CM | POA: Diagnosis not present

## 2021-03-15 DIAGNOSIS — G20 Parkinson's disease: Secondary | ICD-10-CM | POA: Diagnosis not present

## 2021-03-15 DIAGNOSIS — R1310 Dysphagia, unspecified: Secondary | ICD-10-CM | POA: Diagnosis not present

## 2021-03-15 DIAGNOSIS — Z8572 Personal history of non-Hodgkin lymphomas: Secondary | ICD-10-CM | POA: Diagnosis not present

## 2021-03-15 DIAGNOSIS — R32 Unspecified urinary incontinence: Secondary | ICD-10-CM | POA: Diagnosis not present

## 2021-03-15 DIAGNOSIS — I350 Nonrheumatic aortic (valve) stenosis: Secondary | ICD-10-CM | POA: Diagnosis not present

## 2021-03-15 DIAGNOSIS — R339 Retention of urine, unspecified: Secondary | ICD-10-CM | POA: Diagnosis not present

## 2021-03-15 DIAGNOSIS — I129 Hypertensive chronic kidney disease with stage 1 through stage 4 chronic kidney disease, or unspecified chronic kidney disease: Secondary | ICD-10-CM | POA: Diagnosis not present

## 2021-03-15 DIAGNOSIS — J45909 Unspecified asthma, uncomplicated: Secondary | ICD-10-CM | POA: Diagnosis not present

## 2021-03-15 DIAGNOSIS — Z853 Personal history of malignant neoplasm of breast: Secondary | ICD-10-CM | POA: Diagnosis not present

## 2021-03-15 DIAGNOSIS — F0280 Dementia in other diseases classified elsewhere without behavioral disturbance: Secondary | ICD-10-CM | POA: Diagnosis not present

## 2021-03-15 DIAGNOSIS — D631 Anemia in chronic kidney disease: Secondary | ICD-10-CM | POA: Diagnosis not present

## 2021-03-15 DIAGNOSIS — S2242XD Multiple fractures of ribs, left side, subsequent encounter for fracture with routine healing: Secondary | ICD-10-CM | POA: Diagnosis not present

## 2021-03-17 DIAGNOSIS — S32029D Unspecified fracture of second lumbar vertebra, subsequent encounter for fracture with routine healing: Secondary | ICD-10-CM | POA: Diagnosis not present

## 2021-03-17 DIAGNOSIS — S32019D Unspecified fracture of first lumbar vertebra, subsequent encounter for fracture with routine healing: Secondary | ICD-10-CM | POA: Diagnosis not present

## 2021-03-17 DIAGNOSIS — S2242XD Multiple fractures of ribs, left side, subsequent encounter for fracture with routine healing: Secondary | ICD-10-CM | POA: Diagnosis not present

## 2021-03-17 DIAGNOSIS — I129 Hypertensive chronic kidney disease with stage 1 through stage 4 chronic kidney disease, or unspecified chronic kidney disease: Secondary | ICD-10-CM | POA: Diagnosis not present

## 2021-03-17 DIAGNOSIS — N1832 Chronic kidney disease, stage 3b: Secondary | ICD-10-CM | POA: Diagnosis not present

## 2021-03-17 DIAGNOSIS — D631 Anemia in chronic kidney disease: Secondary | ICD-10-CM | POA: Diagnosis not present

## 2021-03-20 DIAGNOSIS — N1832 Chronic kidney disease, stage 3b: Secondary | ICD-10-CM | POA: Diagnosis not present

## 2021-03-20 DIAGNOSIS — I129 Hypertensive chronic kidney disease with stage 1 through stage 4 chronic kidney disease, or unspecified chronic kidney disease: Secondary | ICD-10-CM | POA: Diagnosis not present

## 2021-03-20 DIAGNOSIS — D631 Anemia in chronic kidney disease: Secondary | ICD-10-CM | POA: Diagnosis not present

## 2021-03-20 DIAGNOSIS — S32029D Unspecified fracture of second lumbar vertebra, subsequent encounter for fracture with routine healing: Secondary | ICD-10-CM | POA: Diagnosis not present

## 2021-03-20 DIAGNOSIS — S32019D Unspecified fracture of first lumbar vertebra, subsequent encounter for fracture with routine healing: Secondary | ICD-10-CM | POA: Diagnosis not present

## 2021-03-20 DIAGNOSIS — S2242XD Multiple fractures of ribs, left side, subsequent encounter for fracture with routine healing: Secondary | ICD-10-CM | POA: Diagnosis not present

## 2021-03-24 DIAGNOSIS — S32019D Unspecified fracture of first lumbar vertebra, subsequent encounter for fracture with routine healing: Secondary | ICD-10-CM | POA: Diagnosis not present

## 2021-03-24 DIAGNOSIS — S32029D Unspecified fracture of second lumbar vertebra, subsequent encounter for fracture with routine healing: Secondary | ICD-10-CM | POA: Diagnosis not present

## 2021-03-24 DIAGNOSIS — S2242XD Multiple fractures of ribs, left side, subsequent encounter for fracture with routine healing: Secondary | ICD-10-CM | POA: Diagnosis not present

## 2021-03-24 DIAGNOSIS — I129 Hypertensive chronic kidney disease with stage 1 through stage 4 chronic kidney disease, or unspecified chronic kidney disease: Secondary | ICD-10-CM | POA: Diagnosis not present

## 2021-03-24 DIAGNOSIS — N1832 Chronic kidney disease, stage 3b: Secondary | ICD-10-CM | POA: Diagnosis not present

## 2021-03-24 DIAGNOSIS — D631 Anemia in chronic kidney disease: Secondary | ICD-10-CM | POA: Diagnosis not present

## 2021-03-27 DIAGNOSIS — S32029D Unspecified fracture of second lumbar vertebra, subsequent encounter for fracture with routine healing: Secondary | ICD-10-CM | POA: Diagnosis not present

## 2021-03-27 DIAGNOSIS — S32019D Unspecified fracture of first lumbar vertebra, subsequent encounter for fracture with routine healing: Secondary | ICD-10-CM | POA: Diagnosis not present

## 2021-03-27 DIAGNOSIS — I129 Hypertensive chronic kidney disease with stage 1 through stage 4 chronic kidney disease, or unspecified chronic kidney disease: Secondary | ICD-10-CM | POA: Diagnosis not present

## 2021-03-27 DIAGNOSIS — N1832 Chronic kidney disease, stage 3b: Secondary | ICD-10-CM | POA: Diagnosis not present

## 2021-03-27 DIAGNOSIS — D631 Anemia in chronic kidney disease: Secondary | ICD-10-CM | POA: Diagnosis not present

## 2021-03-27 DIAGNOSIS — S2242XD Multiple fractures of ribs, left side, subsequent encounter for fracture with routine healing: Secondary | ICD-10-CM | POA: Diagnosis not present

## 2021-04-01 DIAGNOSIS — S32019D Unspecified fracture of first lumbar vertebra, subsequent encounter for fracture with routine healing: Secondary | ICD-10-CM | POA: Diagnosis not present

## 2021-04-01 DIAGNOSIS — N1832 Chronic kidney disease, stage 3b: Secondary | ICD-10-CM | POA: Diagnosis not present

## 2021-04-01 DIAGNOSIS — I129 Hypertensive chronic kidney disease with stage 1 through stage 4 chronic kidney disease, or unspecified chronic kidney disease: Secondary | ICD-10-CM | POA: Diagnosis not present

## 2021-04-01 DIAGNOSIS — S2242XD Multiple fractures of ribs, left side, subsequent encounter for fracture with routine healing: Secondary | ICD-10-CM | POA: Diagnosis not present

## 2021-04-01 DIAGNOSIS — D631 Anemia in chronic kidney disease: Secondary | ICD-10-CM | POA: Diagnosis not present

## 2021-04-01 DIAGNOSIS — S32029D Unspecified fracture of second lumbar vertebra, subsequent encounter for fracture with routine healing: Secondary | ICD-10-CM | POA: Diagnosis not present

## 2021-04-07 DIAGNOSIS — S32019D Unspecified fracture of first lumbar vertebra, subsequent encounter for fracture with routine healing: Secondary | ICD-10-CM | POA: Diagnosis not present

## 2021-04-07 DIAGNOSIS — S2242XD Multiple fractures of ribs, left side, subsequent encounter for fracture with routine healing: Secondary | ICD-10-CM | POA: Diagnosis not present

## 2021-04-07 DIAGNOSIS — D631 Anemia in chronic kidney disease: Secondary | ICD-10-CM | POA: Diagnosis not present

## 2021-04-07 DIAGNOSIS — N1832 Chronic kidney disease, stage 3b: Secondary | ICD-10-CM | POA: Diagnosis not present

## 2021-04-07 DIAGNOSIS — S32029D Unspecified fracture of second lumbar vertebra, subsequent encounter for fracture with routine healing: Secondary | ICD-10-CM | POA: Diagnosis not present

## 2021-04-07 DIAGNOSIS — I129 Hypertensive chronic kidney disease with stage 1 through stage 4 chronic kidney disease, or unspecified chronic kidney disease: Secondary | ICD-10-CM | POA: Diagnosis not present

## 2021-04-10 DIAGNOSIS — D631 Anemia in chronic kidney disease: Secondary | ICD-10-CM | POA: Diagnosis not present

## 2021-04-10 DIAGNOSIS — S32019D Unspecified fracture of first lumbar vertebra, subsequent encounter for fracture with routine healing: Secondary | ICD-10-CM | POA: Diagnosis not present

## 2021-04-10 DIAGNOSIS — S32029D Unspecified fracture of second lumbar vertebra, subsequent encounter for fracture with routine healing: Secondary | ICD-10-CM | POA: Diagnosis not present

## 2021-04-10 DIAGNOSIS — N1832 Chronic kidney disease, stage 3b: Secondary | ICD-10-CM | POA: Diagnosis not present

## 2021-04-10 DIAGNOSIS — I129 Hypertensive chronic kidney disease with stage 1 through stage 4 chronic kidney disease, or unspecified chronic kidney disease: Secondary | ICD-10-CM | POA: Diagnosis not present

## 2021-04-10 DIAGNOSIS — S2242XD Multiple fractures of ribs, left side, subsequent encounter for fracture with routine healing: Secondary | ICD-10-CM | POA: Diagnosis not present

## 2021-04-14 DIAGNOSIS — Z9013 Acquired absence of bilateral breasts and nipples: Secondary | ICD-10-CM | POA: Diagnosis not present

## 2021-04-14 DIAGNOSIS — Z8572 Personal history of non-Hodgkin lymphomas: Secondary | ICD-10-CM | POA: Diagnosis not present

## 2021-04-14 DIAGNOSIS — F329 Major depressive disorder, single episode, unspecified: Secondary | ICD-10-CM | POA: Diagnosis not present

## 2021-04-14 DIAGNOSIS — R32 Unspecified urinary incontinence: Secondary | ICD-10-CM | POA: Diagnosis not present

## 2021-04-14 DIAGNOSIS — J45909 Unspecified asthma, uncomplicated: Secondary | ICD-10-CM | POA: Diagnosis not present

## 2021-04-14 DIAGNOSIS — N1832 Chronic kidney disease, stage 3b: Secondary | ICD-10-CM | POA: Diagnosis not present

## 2021-04-14 DIAGNOSIS — K219 Gastro-esophageal reflux disease without esophagitis: Secondary | ICD-10-CM | POA: Diagnosis not present

## 2021-04-14 DIAGNOSIS — R1310 Dysphagia, unspecified: Secondary | ICD-10-CM | POA: Diagnosis not present

## 2021-04-14 DIAGNOSIS — G9341 Metabolic encephalopathy: Secondary | ICD-10-CM | POA: Diagnosis not present

## 2021-04-14 DIAGNOSIS — I129 Hypertensive chronic kidney disease with stage 1 through stage 4 chronic kidney disease, or unspecified chronic kidney disease: Secondary | ICD-10-CM | POA: Diagnosis not present

## 2021-04-14 DIAGNOSIS — I350 Nonrheumatic aortic (valve) stenosis: Secondary | ICD-10-CM | POA: Diagnosis not present

## 2021-04-14 DIAGNOSIS — G20 Parkinson's disease: Secondary | ICD-10-CM | POA: Diagnosis not present

## 2021-04-14 DIAGNOSIS — S2242XD Multiple fractures of ribs, left side, subsequent encounter for fracture with routine healing: Secondary | ICD-10-CM | POA: Diagnosis not present

## 2021-04-14 DIAGNOSIS — H9193 Unspecified hearing loss, bilateral: Secondary | ICD-10-CM | POA: Diagnosis not present

## 2021-04-14 DIAGNOSIS — S32029D Unspecified fracture of second lumbar vertebra, subsequent encounter for fracture with routine healing: Secondary | ICD-10-CM | POA: Diagnosis not present

## 2021-04-14 DIAGNOSIS — D631 Anemia in chronic kidney disease: Secondary | ICD-10-CM | POA: Diagnosis not present

## 2021-04-14 DIAGNOSIS — S32019D Unspecified fracture of first lumbar vertebra, subsequent encounter for fracture with routine healing: Secondary | ICD-10-CM | POA: Diagnosis not present

## 2021-04-14 DIAGNOSIS — F0280 Dementia in other diseases classified elsewhere without behavioral disturbance: Secondary | ICD-10-CM | POA: Diagnosis not present

## 2021-04-14 DIAGNOSIS — R339 Retention of urine, unspecified: Secondary | ICD-10-CM | POA: Diagnosis not present

## 2021-04-14 DIAGNOSIS — Z853 Personal history of malignant neoplasm of breast: Secondary | ICD-10-CM | POA: Diagnosis not present

## 2021-04-17 DIAGNOSIS — I129 Hypertensive chronic kidney disease with stage 1 through stage 4 chronic kidney disease, or unspecified chronic kidney disease: Secondary | ICD-10-CM | POA: Diagnosis not present

## 2021-04-17 DIAGNOSIS — S2242XD Multiple fractures of ribs, left side, subsequent encounter for fracture with routine healing: Secondary | ICD-10-CM | POA: Diagnosis not present

## 2021-04-17 DIAGNOSIS — S32029D Unspecified fracture of second lumbar vertebra, subsequent encounter for fracture with routine healing: Secondary | ICD-10-CM | POA: Diagnosis not present

## 2021-04-17 DIAGNOSIS — S32019D Unspecified fracture of first lumbar vertebra, subsequent encounter for fracture with routine healing: Secondary | ICD-10-CM | POA: Diagnosis not present

## 2021-04-17 DIAGNOSIS — D631 Anemia in chronic kidney disease: Secondary | ICD-10-CM | POA: Diagnosis not present

## 2021-04-17 DIAGNOSIS — N1832 Chronic kidney disease, stage 3b: Secondary | ICD-10-CM | POA: Diagnosis not present

## 2021-04-21 DIAGNOSIS — D631 Anemia in chronic kidney disease: Secondary | ICD-10-CM | POA: Diagnosis not present

## 2021-04-21 DIAGNOSIS — S32019D Unspecified fracture of first lumbar vertebra, subsequent encounter for fracture with routine healing: Secondary | ICD-10-CM | POA: Diagnosis not present

## 2021-04-21 DIAGNOSIS — S2242XD Multiple fractures of ribs, left side, subsequent encounter for fracture with routine healing: Secondary | ICD-10-CM | POA: Diagnosis not present

## 2021-04-21 DIAGNOSIS — I129 Hypertensive chronic kidney disease with stage 1 through stage 4 chronic kidney disease, or unspecified chronic kidney disease: Secondary | ICD-10-CM | POA: Diagnosis not present

## 2021-04-21 DIAGNOSIS — N1832 Chronic kidney disease, stage 3b: Secondary | ICD-10-CM | POA: Diagnosis not present

## 2021-04-21 DIAGNOSIS — S32029D Unspecified fracture of second lumbar vertebra, subsequent encounter for fracture with routine healing: Secondary | ICD-10-CM | POA: Diagnosis not present

## 2021-04-24 DIAGNOSIS — S32029D Unspecified fracture of second lumbar vertebra, subsequent encounter for fracture with routine healing: Secondary | ICD-10-CM | POA: Diagnosis not present

## 2021-04-24 DIAGNOSIS — S2242XD Multiple fractures of ribs, left side, subsequent encounter for fracture with routine healing: Secondary | ICD-10-CM | POA: Diagnosis not present

## 2021-04-24 DIAGNOSIS — Z23 Encounter for immunization: Secondary | ICD-10-CM | POA: Diagnosis not present

## 2021-04-24 DIAGNOSIS — D631 Anemia in chronic kidney disease: Secondary | ICD-10-CM | POA: Diagnosis not present

## 2021-04-24 DIAGNOSIS — S32019D Unspecified fracture of first lumbar vertebra, subsequent encounter for fracture with routine healing: Secondary | ICD-10-CM | POA: Diagnosis not present

## 2021-04-24 DIAGNOSIS — N1832 Chronic kidney disease, stage 3b: Secondary | ICD-10-CM | POA: Diagnosis not present

## 2021-04-24 DIAGNOSIS — I129 Hypertensive chronic kidney disease with stage 1 through stage 4 chronic kidney disease, or unspecified chronic kidney disease: Secondary | ICD-10-CM | POA: Diagnosis not present

## 2021-04-24 DIAGNOSIS — G20 Parkinson's disease: Secondary | ICD-10-CM | POA: Diagnosis not present

## 2021-04-24 DIAGNOSIS — F329 Major depressive disorder, single episode, unspecified: Secondary | ICD-10-CM | POA: Diagnosis not present

## 2021-04-24 DIAGNOSIS — I10 Essential (primary) hypertension: Secondary | ICD-10-CM | POA: Diagnosis not present

## 2021-04-28 DIAGNOSIS — I129 Hypertensive chronic kidney disease with stage 1 through stage 4 chronic kidney disease, or unspecified chronic kidney disease: Secondary | ICD-10-CM | POA: Diagnosis not present

## 2021-04-28 DIAGNOSIS — S32019D Unspecified fracture of first lumbar vertebra, subsequent encounter for fracture with routine healing: Secondary | ICD-10-CM | POA: Diagnosis not present

## 2021-04-28 DIAGNOSIS — S32029D Unspecified fracture of second lumbar vertebra, subsequent encounter for fracture with routine healing: Secondary | ICD-10-CM | POA: Diagnosis not present

## 2021-04-28 DIAGNOSIS — S2242XD Multiple fractures of ribs, left side, subsequent encounter for fracture with routine healing: Secondary | ICD-10-CM | POA: Diagnosis not present

## 2021-04-28 DIAGNOSIS — D631 Anemia in chronic kidney disease: Secondary | ICD-10-CM | POA: Diagnosis not present

## 2021-04-28 DIAGNOSIS — N1832 Chronic kidney disease, stage 3b: Secondary | ICD-10-CM | POA: Diagnosis not present

## 2021-05-01 DIAGNOSIS — I129 Hypertensive chronic kidney disease with stage 1 through stage 4 chronic kidney disease, or unspecified chronic kidney disease: Secondary | ICD-10-CM | POA: Diagnosis not present

## 2021-05-01 DIAGNOSIS — S2242XD Multiple fractures of ribs, left side, subsequent encounter for fracture with routine healing: Secondary | ICD-10-CM | POA: Diagnosis not present

## 2021-05-01 DIAGNOSIS — D631 Anemia in chronic kidney disease: Secondary | ICD-10-CM | POA: Diagnosis not present

## 2021-05-01 DIAGNOSIS — N1832 Chronic kidney disease, stage 3b: Secondary | ICD-10-CM | POA: Diagnosis not present

## 2021-05-01 DIAGNOSIS — S32029D Unspecified fracture of second lumbar vertebra, subsequent encounter for fracture with routine healing: Secondary | ICD-10-CM | POA: Diagnosis not present

## 2021-05-01 DIAGNOSIS — S32019D Unspecified fracture of first lumbar vertebra, subsequent encounter for fracture with routine healing: Secondary | ICD-10-CM | POA: Diagnosis not present

## 2021-05-05 DIAGNOSIS — I129 Hypertensive chronic kidney disease with stage 1 through stage 4 chronic kidney disease, or unspecified chronic kidney disease: Secondary | ICD-10-CM | POA: Diagnosis not present

## 2021-05-05 DIAGNOSIS — S32019D Unspecified fracture of first lumbar vertebra, subsequent encounter for fracture with routine healing: Secondary | ICD-10-CM | POA: Diagnosis not present

## 2021-05-05 DIAGNOSIS — S2242XD Multiple fractures of ribs, left side, subsequent encounter for fracture with routine healing: Secondary | ICD-10-CM | POA: Diagnosis not present

## 2021-05-05 DIAGNOSIS — N1832 Chronic kidney disease, stage 3b: Secondary | ICD-10-CM | POA: Diagnosis not present

## 2021-05-05 DIAGNOSIS — S32029D Unspecified fracture of second lumbar vertebra, subsequent encounter for fracture with routine healing: Secondary | ICD-10-CM | POA: Diagnosis not present

## 2021-05-05 DIAGNOSIS — D631 Anemia in chronic kidney disease: Secondary | ICD-10-CM | POA: Diagnosis not present

## 2021-05-08 DIAGNOSIS — I129 Hypertensive chronic kidney disease with stage 1 through stage 4 chronic kidney disease, or unspecified chronic kidney disease: Secondary | ICD-10-CM | POA: Diagnosis not present

## 2021-05-08 DIAGNOSIS — D631 Anemia in chronic kidney disease: Secondary | ICD-10-CM | POA: Diagnosis not present

## 2021-05-08 DIAGNOSIS — S32029D Unspecified fracture of second lumbar vertebra, subsequent encounter for fracture with routine healing: Secondary | ICD-10-CM | POA: Diagnosis not present

## 2021-05-08 DIAGNOSIS — S32019D Unspecified fracture of first lumbar vertebra, subsequent encounter for fracture with routine healing: Secondary | ICD-10-CM | POA: Diagnosis not present

## 2021-05-08 DIAGNOSIS — S2242XD Multiple fractures of ribs, left side, subsequent encounter for fracture with routine healing: Secondary | ICD-10-CM | POA: Diagnosis not present

## 2021-05-08 DIAGNOSIS — N1832 Chronic kidney disease, stage 3b: Secondary | ICD-10-CM | POA: Diagnosis not present

## 2021-05-12 DIAGNOSIS — S32019D Unspecified fracture of first lumbar vertebra, subsequent encounter for fracture with routine healing: Secondary | ICD-10-CM | POA: Diagnosis not present

## 2021-05-12 DIAGNOSIS — D631 Anemia in chronic kidney disease: Secondary | ICD-10-CM | POA: Diagnosis not present

## 2021-05-12 DIAGNOSIS — S2242XD Multiple fractures of ribs, left side, subsequent encounter for fracture with routine healing: Secondary | ICD-10-CM | POA: Diagnosis not present

## 2021-05-12 DIAGNOSIS — S32029D Unspecified fracture of second lumbar vertebra, subsequent encounter for fracture with routine healing: Secondary | ICD-10-CM | POA: Diagnosis not present

## 2021-05-12 DIAGNOSIS — I129 Hypertensive chronic kidney disease with stage 1 through stage 4 chronic kidney disease, or unspecified chronic kidney disease: Secondary | ICD-10-CM | POA: Diagnosis not present

## 2021-05-12 DIAGNOSIS — N1832 Chronic kidney disease, stage 3b: Secondary | ICD-10-CM | POA: Diagnosis not present

## 2021-05-14 DIAGNOSIS — F329 Major depressive disorder, single episode, unspecified: Secondary | ICD-10-CM | POA: Diagnosis not present

## 2021-05-14 DIAGNOSIS — Z8572 Personal history of non-Hodgkin lymphomas: Secondary | ICD-10-CM | POA: Diagnosis not present

## 2021-05-14 DIAGNOSIS — K219 Gastro-esophageal reflux disease without esophagitis: Secondary | ICD-10-CM | POA: Diagnosis not present

## 2021-05-14 DIAGNOSIS — I350 Nonrheumatic aortic (valve) stenosis: Secondary | ICD-10-CM | POA: Diagnosis not present

## 2021-05-14 DIAGNOSIS — F0283 Dementia in other diseases classified elsewhere, unspecified severity, with mood disturbance: Secondary | ICD-10-CM | POA: Diagnosis not present

## 2021-05-14 DIAGNOSIS — R339 Retention of urine, unspecified: Secondary | ICD-10-CM | POA: Diagnosis not present

## 2021-05-14 DIAGNOSIS — D631 Anemia in chronic kidney disease: Secondary | ICD-10-CM | POA: Diagnosis not present

## 2021-05-14 DIAGNOSIS — H9193 Unspecified hearing loss, bilateral: Secondary | ICD-10-CM | POA: Diagnosis not present

## 2021-05-14 DIAGNOSIS — Z853 Personal history of malignant neoplasm of breast: Secondary | ICD-10-CM | POA: Diagnosis not present

## 2021-05-14 DIAGNOSIS — R1310 Dysphagia, unspecified: Secondary | ICD-10-CM | POA: Diagnosis not present

## 2021-05-14 DIAGNOSIS — N1832 Chronic kidney disease, stage 3b: Secondary | ICD-10-CM | POA: Diagnosis not present

## 2021-05-14 DIAGNOSIS — G9341 Metabolic encephalopathy: Secondary | ICD-10-CM | POA: Diagnosis not present

## 2021-05-14 DIAGNOSIS — I129 Hypertensive chronic kidney disease with stage 1 through stage 4 chronic kidney disease, or unspecified chronic kidney disease: Secondary | ICD-10-CM | POA: Diagnosis not present

## 2021-05-14 DIAGNOSIS — J45909 Unspecified asthma, uncomplicated: Secondary | ICD-10-CM | POA: Diagnosis not present

## 2021-05-14 DIAGNOSIS — G20 Parkinson's disease: Secondary | ICD-10-CM | POA: Diagnosis not present

## 2021-05-14 DIAGNOSIS — Z9013 Acquired absence of bilateral breasts and nipples: Secondary | ICD-10-CM | POA: Diagnosis not present

## 2021-05-15 DIAGNOSIS — G20 Parkinson's disease: Secondary | ICD-10-CM | POA: Diagnosis not present

## 2021-05-15 DIAGNOSIS — I129 Hypertensive chronic kidney disease with stage 1 through stage 4 chronic kidney disease, or unspecified chronic kidney disease: Secondary | ICD-10-CM | POA: Diagnosis not present

## 2021-05-15 DIAGNOSIS — N1832 Chronic kidney disease, stage 3b: Secondary | ICD-10-CM | POA: Diagnosis not present

## 2021-05-15 DIAGNOSIS — F0283 Dementia in other diseases classified elsewhere, unspecified severity, with mood disturbance: Secondary | ICD-10-CM | POA: Diagnosis not present

## 2021-05-15 DIAGNOSIS — F329 Major depressive disorder, single episode, unspecified: Secondary | ICD-10-CM | POA: Diagnosis not present

## 2021-05-15 DIAGNOSIS — D631 Anemia in chronic kidney disease: Secondary | ICD-10-CM | POA: Diagnosis not present

## 2021-05-16 DIAGNOSIS — G20 Parkinson's disease: Secondary | ICD-10-CM | POA: Diagnosis not present

## 2021-05-16 DIAGNOSIS — F329 Major depressive disorder, single episode, unspecified: Secondary | ICD-10-CM | POA: Diagnosis not present

## 2021-05-16 DIAGNOSIS — F0283 Dementia in other diseases classified elsewhere, unspecified severity, with mood disturbance: Secondary | ICD-10-CM | POA: Diagnosis not present

## 2021-05-16 DIAGNOSIS — D631 Anemia in chronic kidney disease: Secondary | ICD-10-CM | POA: Diagnosis not present

## 2021-05-16 DIAGNOSIS — N1832 Chronic kidney disease, stage 3b: Secondary | ICD-10-CM | POA: Diagnosis not present

## 2021-05-16 DIAGNOSIS — I129 Hypertensive chronic kidney disease with stage 1 through stage 4 chronic kidney disease, or unspecified chronic kidney disease: Secondary | ICD-10-CM | POA: Diagnosis not present

## 2021-05-19 DIAGNOSIS — N1832 Chronic kidney disease, stage 3b: Secondary | ICD-10-CM | POA: Diagnosis not present

## 2021-05-19 DIAGNOSIS — F329 Major depressive disorder, single episode, unspecified: Secondary | ICD-10-CM | POA: Diagnosis not present

## 2021-05-19 DIAGNOSIS — I129 Hypertensive chronic kidney disease with stage 1 through stage 4 chronic kidney disease, or unspecified chronic kidney disease: Secondary | ICD-10-CM | POA: Diagnosis not present

## 2021-05-19 DIAGNOSIS — F0283 Dementia in other diseases classified elsewhere, unspecified severity, with mood disturbance: Secondary | ICD-10-CM | POA: Diagnosis not present

## 2021-05-19 DIAGNOSIS — G20 Parkinson's disease: Secondary | ICD-10-CM | POA: Diagnosis not present

## 2021-05-19 DIAGNOSIS — D631 Anemia in chronic kidney disease: Secondary | ICD-10-CM | POA: Diagnosis not present

## 2021-05-22 DIAGNOSIS — F329 Major depressive disorder, single episode, unspecified: Secondary | ICD-10-CM | POA: Diagnosis not present

## 2021-05-22 DIAGNOSIS — I129 Hypertensive chronic kidney disease with stage 1 through stage 4 chronic kidney disease, or unspecified chronic kidney disease: Secondary | ICD-10-CM | POA: Diagnosis not present

## 2021-05-22 DIAGNOSIS — D631 Anemia in chronic kidney disease: Secondary | ICD-10-CM | POA: Diagnosis not present

## 2021-05-22 DIAGNOSIS — N1832 Chronic kidney disease, stage 3b: Secondary | ICD-10-CM | POA: Diagnosis not present

## 2021-05-22 DIAGNOSIS — F0283 Dementia in other diseases classified elsewhere, unspecified severity, with mood disturbance: Secondary | ICD-10-CM | POA: Diagnosis not present

## 2021-05-22 DIAGNOSIS — G20 Parkinson's disease: Secondary | ICD-10-CM | POA: Diagnosis not present

## 2021-05-26 DIAGNOSIS — G20 Parkinson's disease: Secondary | ICD-10-CM | POA: Diagnosis not present

## 2021-05-26 DIAGNOSIS — F0283 Dementia in other diseases classified elsewhere, unspecified severity, with mood disturbance: Secondary | ICD-10-CM | POA: Diagnosis not present

## 2021-05-26 DIAGNOSIS — I129 Hypertensive chronic kidney disease with stage 1 through stage 4 chronic kidney disease, or unspecified chronic kidney disease: Secondary | ICD-10-CM | POA: Diagnosis not present

## 2021-05-26 DIAGNOSIS — N1832 Chronic kidney disease, stage 3b: Secondary | ICD-10-CM | POA: Diagnosis not present

## 2021-05-26 DIAGNOSIS — F329 Major depressive disorder, single episode, unspecified: Secondary | ICD-10-CM | POA: Diagnosis not present

## 2021-05-26 DIAGNOSIS — D631 Anemia in chronic kidney disease: Secondary | ICD-10-CM | POA: Diagnosis not present

## 2021-05-28 DIAGNOSIS — N1832 Chronic kidney disease, stage 3b: Secondary | ICD-10-CM | POA: Diagnosis not present

## 2021-05-28 DIAGNOSIS — F329 Major depressive disorder, single episode, unspecified: Secondary | ICD-10-CM | POA: Diagnosis not present

## 2021-05-28 DIAGNOSIS — F0283 Dementia in other diseases classified elsewhere, unspecified severity, with mood disturbance: Secondary | ICD-10-CM | POA: Diagnosis not present

## 2021-05-28 DIAGNOSIS — G20 Parkinson's disease: Secondary | ICD-10-CM | POA: Diagnosis not present

## 2021-05-28 DIAGNOSIS — I129 Hypertensive chronic kidney disease with stage 1 through stage 4 chronic kidney disease, or unspecified chronic kidney disease: Secondary | ICD-10-CM | POA: Diagnosis not present

## 2021-05-28 DIAGNOSIS — D631 Anemia in chronic kidney disease: Secondary | ICD-10-CM | POA: Diagnosis not present

## 2021-05-29 DIAGNOSIS — I129 Hypertensive chronic kidney disease with stage 1 through stage 4 chronic kidney disease, or unspecified chronic kidney disease: Secondary | ICD-10-CM | POA: Diagnosis not present

## 2021-05-29 DIAGNOSIS — D631 Anemia in chronic kidney disease: Secondary | ICD-10-CM | POA: Diagnosis not present

## 2021-05-29 DIAGNOSIS — G20 Parkinson's disease: Secondary | ICD-10-CM | POA: Diagnosis not present

## 2021-05-29 DIAGNOSIS — F329 Major depressive disorder, single episode, unspecified: Secondary | ICD-10-CM | POA: Diagnosis not present

## 2021-05-29 DIAGNOSIS — F0283 Dementia in other diseases classified elsewhere, unspecified severity, with mood disturbance: Secondary | ICD-10-CM | POA: Diagnosis not present

## 2021-05-29 DIAGNOSIS — N1832 Chronic kidney disease, stage 3b: Secondary | ICD-10-CM | POA: Diagnosis not present

## 2021-06-02 DIAGNOSIS — F329 Major depressive disorder, single episode, unspecified: Secondary | ICD-10-CM | POA: Diagnosis not present

## 2021-06-02 DIAGNOSIS — I129 Hypertensive chronic kidney disease with stage 1 through stage 4 chronic kidney disease, or unspecified chronic kidney disease: Secondary | ICD-10-CM | POA: Diagnosis not present

## 2021-06-02 DIAGNOSIS — F0283 Dementia in other diseases classified elsewhere, unspecified severity, with mood disturbance: Secondary | ICD-10-CM | POA: Diagnosis not present

## 2021-06-02 DIAGNOSIS — G20 Parkinson's disease: Secondary | ICD-10-CM | POA: Diagnosis not present

## 2021-06-02 DIAGNOSIS — D631 Anemia in chronic kidney disease: Secondary | ICD-10-CM | POA: Diagnosis not present

## 2021-06-02 DIAGNOSIS — N1832 Chronic kidney disease, stage 3b: Secondary | ICD-10-CM | POA: Diagnosis not present

## 2021-06-03 DIAGNOSIS — F329 Major depressive disorder, single episode, unspecified: Secondary | ICD-10-CM | POA: Diagnosis not present

## 2021-06-03 DIAGNOSIS — D631 Anemia in chronic kidney disease: Secondary | ICD-10-CM | POA: Diagnosis not present

## 2021-06-03 DIAGNOSIS — N1832 Chronic kidney disease, stage 3b: Secondary | ICD-10-CM | POA: Diagnosis not present

## 2021-06-03 DIAGNOSIS — F0283 Dementia in other diseases classified elsewhere, unspecified severity, with mood disturbance: Secondary | ICD-10-CM | POA: Diagnosis not present

## 2021-06-03 DIAGNOSIS — G20 Parkinson's disease: Secondary | ICD-10-CM | POA: Diagnosis not present

## 2021-06-03 DIAGNOSIS — I129 Hypertensive chronic kidney disease with stage 1 through stage 4 chronic kidney disease, or unspecified chronic kidney disease: Secondary | ICD-10-CM | POA: Diagnosis not present

## 2021-06-05 DIAGNOSIS — G20 Parkinson's disease: Secondary | ICD-10-CM | POA: Diagnosis not present

## 2021-06-05 DIAGNOSIS — F329 Major depressive disorder, single episode, unspecified: Secondary | ICD-10-CM | POA: Diagnosis not present

## 2021-06-05 DIAGNOSIS — D631 Anemia in chronic kidney disease: Secondary | ICD-10-CM | POA: Diagnosis not present

## 2021-06-05 DIAGNOSIS — F0283 Dementia in other diseases classified elsewhere, unspecified severity, with mood disturbance: Secondary | ICD-10-CM | POA: Diagnosis not present

## 2021-06-05 DIAGNOSIS — I129 Hypertensive chronic kidney disease with stage 1 through stage 4 chronic kidney disease, or unspecified chronic kidney disease: Secondary | ICD-10-CM | POA: Diagnosis not present

## 2021-06-05 DIAGNOSIS — N1832 Chronic kidney disease, stage 3b: Secondary | ICD-10-CM | POA: Diagnosis not present

## 2021-06-07 ENCOUNTER — Encounter (HOSPITAL_COMMUNITY): Payer: Self-pay

## 2021-06-07 ENCOUNTER — Inpatient Hospital Stay (HOSPITAL_COMMUNITY)
Admission: EM | Admit: 2021-06-07 | Discharge: 2021-06-11 | DRG: 177 | Disposition: A | Discharge status: Home Health | Payer: Medicare Other | Attending: Internal Medicine | Admitting: Internal Medicine

## 2021-06-07 ENCOUNTER — Other Ambulatory Visit: Payer: Self-pay

## 2021-06-07 DIAGNOSIS — F419 Anxiety disorder, unspecified: Secondary | ICD-10-CM | POA: Diagnosis present

## 2021-06-07 DIAGNOSIS — I350 Nonrheumatic aortic (valve) stenosis: Secondary | ICD-10-CM | POA: Diagnosis present

## 2021-06-07 DIAGNOSIS — I160 Hypertensive urgency: Secondary | ICD-10-CM | POA: Diagnosis present

## 2021-06-07 DIAGNOSIS — E43 Unspecified severe protein-calorie malnutrition: Secondary | ICD-10-CM | POA: Diagnosis present

## 2021-06-07 DIAGNOSIS — J189 Pneumonia, unspecified organism: Secondary | ICD-10-CM | POA: Diagnosis present

## 2021-06-07 DIAGNOSIS — R6889 Other general symptoms and signs: Secondary | ICD-10-CM | POA: Diagnosis not present

## 2021-06-07 DIAGNOSIS — F32A Depression, unspecified: Secondary | ICD-10-CM | POA: Diagnosis present

## 2021-06-07 DIAGNOSIS — Z20822 Contact with and (suspected) exposure to covid-19: Secondary | ICD-10-CM | POA: Diagnosis not present

## 2021-06-07 DIAGNOSIS — Z9013 Acquired absence of bilateral breasts and nipples: Secondary | ICD-10-CM

## 2021-06-07 DIAGNOSIS — K219 Gastro-esophageal reflux disease without esophagitis: Secondary | ICD-10-CM | POA: Diagnosis present

## 2021-06-07 DIAGNOSIS — Z881 Allergy status to other antibiotic agents status: Secondary | ICD-10-CM

## 2021-06-07 DIAGNOSIS — I10 Essential (primary) hypertension: Secondary | ICD-10-CM | POA: Diagnosis present

## 2021-06-07 DIAGNOSIS — Z853 Personal history of malignant neoplasm of breast: Secondary | ICD-10-CM

## 2021-06-07 DIAGNOSIS — R627 Adult failure to thrive: Secondary | ICD-10-CM | POA: Diagnosis present

## 2021-06-07 DIAGNOSIS — H9190 Unspecified hearing loss, unspecified ear: Secondary | ICD-10-CM | POA: Diagnosis present

## 2021-06-07 DIAGNOSIS — I951 Orthostatic hypotension: Secondary | ICD-10-CM | POA: Diagnosis present

## 2021-06-07 DIAGNOSIS — J690 Pneumonitis due to inhalation of food and vomit: Principal | ICD-10-CM | POA: Diagnosis present

## 2021-06-07 DIAGNOSIS — R410 Disorientation, unspecified: Secondary | ICD-10-CM | POA: Diagnosis not present

## 2021-06-07 DIAGNOSIS — G20 Parkinson's disease: Secondary | ICD-10-CM | POA: Diagnosis not present

## 2021-06-07 DIAGNOSIS — J4520 Mild intermittent asthma, uncomplicated: Secondary | ICD-10-CM | POA: Diagnosis present

## 2021-06-07 DIAGNOSIS — G9341 Metabolic encephalopathy: Secondary | ICD-10-CM | POA: Diagnosis not present

## 2021-06-07 DIAGNOSIS — J40 Bronchitis, not specified as acute or chronic: Secondary | ICD-10-CM | POA: Diagnosis present

## 2021-06-07 DIAGNOSIS — Z885 Allergy status to narcotic agent status: Secondary | ICD-10-CM

## 2021-06-07 DIAGNOSIS — Z66 Do not resuscitate: Secondary | ICD-10-CM | POA: Diagnosis not present

## 2021-06-07 DIAGNOSIS — J180 Bronchopneumonia, unspecified organism: Secondary | ICD-10-CM | POA: Diagnosis present

## 2021-06-07 DIAGNOSIS — Z801 Family history of malignant neoplasm of trachea, bronchus and lung: Secondary | ICD-10-CM

## 2021-06-07 DIAGNOSIS — B029 Zoster without complications: Secondary | ICD-10-CM | POA: Diagnosis present

## 2021-06-07 DIAGNOSIS — Z681 Body mass index (BMI) 19 or less, adult: Secondary | ICD-10-CM

## 2021-06-07 DIAGNOSIS — R531 Weakness: Secondary | ICD-10-CM | POA: Diagnosis not present

## 2021-06-07 DIAGNOSIS — Z8572 Personal history of non-Hodgkin lymphomas: Secondary | ICD-10-CM

## 2021-06-07 DIAGNOSIS — R1310 Dysphagia, unspecified: Secondary | ICD-10-CM | POA: Diagnosis present

## 2021-06-07 DIAGNOSIS — E860 Dehydration: Secondary | ICD-10-CM | POA: Diagnosis present

## 2021-06-07 DIAGNOSIS — E119 Type 2 diabetes mellitus without complications: Secondary | ICD-10-CM | POA: Diagnosis present

## 2021-06-07 DIAGNOSIS — Z743 Need for continuous supervision: Secondary | ICD-10-CM | POA: Diagnosis not present

## 2021-06-07 DIAGNOSIS — R4182 Altered mental status, unspecified: Secondary | ICD-10-CM

## 2021-06-07 DIAGNOSIS — Z79899 Other long term (current) drug therapy: Secondary | ICD-10-CM

## 2021-06-07 LAB — COMPREHENSIVE METABOLIC PANEL
ALT: 12 U/L (ref 0–44)
AST: 19 U/L (ref 15–41)
Albumin: 3.8 g/dL (ref 3.5–5.0)
Alkaline Phosphatase: 86 U/L (ref 38–126)
Anion gap: 5 (ref 5–15)
BUN: 19 mg/dL (ref 8–23)
CO2: 27 mmol/L (ref 22–32)
Calcium: 9 mg/dL (ref 8.9–10.3)
Chloride: 105 mmol/L (ref 98–111)
Creatinine, Ser: 0.94 mg/dL (ref 0.44–1.00)
GFR, Estimated: 57 mL/min — ABNORMAL LOW (ref >60)
Glucose, Bld: 104 mg/dL — ABNORMAL HIGH (ref 70–99)
Potassium: 4.4 mmol/L (ref 3.5–5.1)
Sodium: 137 mmol/L (ref 135–145)
Total Bilirubin: 0.8 mg/dL (ref 0.3–1.2)
Total Protein: 7.1 g/dL (ref 6.5–8.1)

## 2021-06-07 LAB — CBC
HCT: 37.6 % (ref 36.0–46.0)
Hemoglobin: 12.5 g/dL (ref 12.0–15.0)
MCH: 29.1 pg (ref 26.0–34.0)
MCHC: 33.2 g/dL (ref 30.0–36.0)
MCV: 87.4 fL (ref 80.0–100.0)
Platelets: 149 10*3/uL — ABNORMAL LOW (ref 150–400)
RBC: 4.3 MIL/uL (ref 3.87–5.11)
RDW: 13.4 % (ref 11.5–15.5)
WBC: 5 10*3/uL (ref 4.0–10.5)
nRBC: 0 % (ref 0.0–0.2)

## 2021-06-07 LAB — RESP PANEL BY RT-PCR (FLU A&B, COVID) ARPGX2
Influenza A by PCR: NEGATIVE
Influenza B by PCR: NEGATIVE
SARS Coronavirus 2 by RT PCR: NEGATIVE

## 2021-06-07 LAB — URINALYSIS, ROUTINE W REFLEX MICROSCOPIC
Bilirubin Urine: NEGATIVE
Glucose, UA: NEGATIVE mg/dL
Hgb urine dipstick: NEGATIVE
Ketones, ur: 5 mg/dL — AB
Leukocytes,Ua: NEGATIVE
Nitrite: NEGATIVE
Protein, ur: NEGATIVE mg/dL
Specific Gravity, Urine: 1.018 (ref 1.005–1.030)
pH: 6 (ref 5.0–8.0)

## 2021-06-07 LAB — LACTIC ACID, PLASMA: Lactic Acid, Venous: 1.1 mmol/L (ref 0.5–1.9)

## 2021-06-07 MED ORDER — SODIUM CHLORIDE 0.9 % IV BOLUS
500.0000 mL | Freq: Once | INTRAVENOUS | Status: AC
Start: 2021-06-07 — End: 2021-06-07
  Administered 2021-06-07: 500 mL via INTRAVENOUS

## 2021-06-07 NOTE — ED Provider Notes (Signed)
[facility] DEPT Provider Note   CSN: [id number] Arrival date & time: 06/07/21  2118     History Chief Complaint  Patient presents with   Altered Mental Status    [name] is a 87 y.o. female.  HPI She presents for evaluation of confusion, weakness, decreased oral intake.  She lives at home with family members.  Patient's husband with her and gives the history.  He states that yesterday she became weak and got weaker today.  She is also gradually more confused over the last 36 hours.  She has baseline weakness and confusion related to chronic illness including Parkinson's disease.  She is generally eating well but did not eat much today.  There has been no fever, cough, vomiting on.  She typically ambulates with a walker.  Tonight the husband and his son were unable to get the patient off the couch so called EMS.  The patient is unable to give any history.  Level 5 caveat-confusion    Past Medical History:  Diagnosis Date   Anxiety    Asthma    Depression    Diabetes mellitus without complication ([place])    9/37/90.Marland KitchenMarland Kitchenpt denies   GERD (gastroesophageal reflux disease)    Hearing loss    Hypertension    NHL (non-Hodgkin's lymphoma) ([place])    nhl dx 9/04 breast ca dx1/12   Parkinson disease [facility])     Patient Active Problem List   Diagnosis Date Noted   Generalized weakness 06/08/2021   GERD (gastroesophageal reflux disease)    Depression    Acute metabolic encephalopathy    Leukopenia    Recurrent left pleural effusion    Weakness    Palliative care by specialist    Goals of care, counseling/discussion    DNR (do not resuscitate)    Hemothorax 01/23/2020   Multiple rib fractures 01/23/2020   Lumbar transverse process fracture ([place]) 01/23/2020   Hypertensive urgency 01/23/2020   Anemia 01/23/2020   Closed fracture of right proximal humerus 05/31/2019   GIB (gastrointestinal bleeding) 02/03/2017   CAP (community acquired  pneumonia) 04/13/2016   PNA (pneumonia) 04/13/2016   Murmur 09/02/2015   Parkinson's disease ([place]) 09/02/2015   Bilateral carotid bruits 09/02/2015   Confusion 08/07/2015   Acute encephalopathy 08/07/2015   Lower urinary tract infectious disease 08/07/2015   Acute kidney failure ([place]) 08/07/2015   Paralysis agitans ([place]) 09/19/2012   Lymphoma ([place]) 07/03/2011   Malignant neoplasm of female breast ([place]) 07/03/2011   Abnormal CT of the chest 03/13/2011   HYPERTENSION, BENIGN 01/01/2009   EDEMA 01/01/2009    Past Surgical History:  Procedure Laterality Date   Ba-HA Ear implant     ESOPHAGOGASTRODUODENOSCOPY N/A 02/04/2017   Procedure: ESOPHAGOGASTRODUODENOSCOPY (EGD);  Surgeon: Arta Silence, MD;  Location: [facility];  Service: Endoscopy;  Laterality: N/A;   IR THORACENTESIS ASP PLEURAL SPACE W/IMG GUIDE  01/30/2020   MASTECTOMY  2 /8/ 12   bilateral   MASTECTOMY       OB History   No obstetric history on file.     Family History  Problem Relation Age of Onset   Lung cancer Father    Cancer Father        lung   Cancer Mother     Social History   Tobacco Use   Smoking status: Never   Smokeless tobacco: Never   Tobacco comments:    husband wsa smoker  Vaping Use   Vaping Use: Never used  Substance Use  Topics   Alcohol use: No    Comment: no   Drug use: No    Home Medications Prior to Admission medications   Medication Sig Start Date End Date Taking? Authorizing Provider  acyclovir (ZOVIRAX) 400 MG tablet TAKE 1 TABLET BY MOUTH TWICE DAILY 02/03/21  Yes [name], MD  carbidopa-levodopa (SINEMET IR) 25-100 MG tablet TAKE 1 TABLET BY MOUTH DAILY EVERY MORNING, 1 IN THE AFTERNOON, AND 2 IN THE EVENING 02/03/21  Yes Tat, Eustace Quail, DO  cholecalciferol (VITAMIN D) 1000 units tablet Take 1,000 Units by mouth every evening.    Yes [provider]  docusate sodium (COLACE) 100 MG capsule Take 100 mg by mouth daily as needed for mild constipation.   Yes  [provider]  sertraline (ZOLOFT) 100 MG tablet Take 1 tablet (100 mg total) by mouth daily. 03/04/18  Yes Tat, Eustace Quail, DO  acetaminophen (TYLENOL) 325 MG tablet Take 650 mg by mouth every 6 (six) hours as needed for mild pain, moderate pain, fever or headache.     [provider]  Calcium Carbonate (CALCIUM-CARB 600 PO) Take 600 mg by mouth daily.    [provider]  cephALEXin (KEFLEX) 250 MG capsule Take 1 capsule (250 mg total) by mouth 4 (four) times daily. 11/11/20   Milton Ferguson, MD  Multiple Vitamins-Minerals (MULTIVITAMIN WITH MINERALS) tablet Take 1 tablet by mouth daily with breakfast.    [provider]  pantoprazole (PROTONIX) 40 MG tablet Take 1 tablet (40 mg total) by mouth daily. 02/03/20   Shelly Coss, MD    Allergies    Azithromycin and Tramadol  Review of Systems   Review of Systems  Unable to perform ROS: Mental status change   Physical Exam Updated Vital Signs BP (!) 188/108   Pulse 86   Temp 98.3 F (36.8 C) (Oral)   Resp 18   Ht 5\' 3"  (1.6 m)   Wt 44.7 kg   LMP  (LMP Unknown)   SpO2 97%   BMI 17.47 kg/m   Physical Exam Vitals and nursing note reviewed.  Constitutional:      General: She is not in acute distress.    Appearance: She is well-developed. She is ill-appearing. She is not toxic-appearing or diaphoretic.  HENT:     Head: Normocephalic and atraumatic.     Nose: No congestion or rhinorrhea.  Eyes:     Conjunctiva/sclera: Conjunctivae normal.     Pupils: Pupils are equal, round, and reactive to light.  Cardiovascular:     Rate and Rhythm: Normal rate and regular rhythm.  Pulmonary:     Effort: Pulmonary effort is normal.     Breath sounds: Normal breath sounds.  Chest:     Chest wall: No tenderness.  Abdominal:     General: There is no distension.     Palpations: Abdomen is soft.     Tenderness: There is no abdominal tenderness. There is no guarding.  Musculoskeletal:        General: No  swelling, tenderness or deformity. Normal range of motion.     Cervical back: Normal range of motion and neck supple.  Skin:    General: Skin is warm and dry.     Coloration: Skin is not jaundiced or pale.  Neurological:     General: No focal deficit present.     Mental Status: She is alert.     Motor: No abnormal muscle tone.     Comments: Nonverbal.  Eyes  open.  Psychiatric:        Mood and Affect: Mood normal.        Behavior: Behavior normal.    ED Results / Procedures / Treatments   Labs (all labs ordered are listed, but only abnormal results are displayed) Labs Reviewed  COMPREHENSIVE METABOLIC PANEL - Abnormal; Notable for the following components:      Result Value   Glucose, Bld 104 (*)    GFR, Estimated 57 (*)    All other components within normal limits  CBC - Abnormal; Notable for the following components:   Platelets 149 (*)    All other components within normal limits  URINALYSIS, ROUTINE W REFLEX MICROSCOPIC - Abnormal; Notable for the following components:   Color, Urine AMBER (*)    APPearance CLOUDY (*)    Ketones, ur 5 (*)    All other components within normal limits  RESP PANEL BY RT-PCR (FLU A&B, COVID) ARPGX2  URINE CULTURE  LACTIC ACID, PLASMA    EKG None  Radiology No results found.  Procedures Procedures   Medications Ordered in ED Medications  sodium chloride 0.9 % bolus 500 mL (0 mLs Intravenous Stopped 06/07/21 2350)    ED Course  I have reviewed the triage vital signs and the nursing notes.  Pertinent labs & imaging results that were available during my care of the patient were reviewed by me and considered in my medical decision making (see chart for details).    MDM Rules/Calculators/A&P                            Patient Vitals for the past 24 hrs:  BP Temp Temp src Pulse Resp SpO2 Height Weight  06/08/21 0045 (!) 188/108 -- -- 86 18 97 % -- --  06/08/21 0015 (!) 187/93 -- -- 86 -- 97 % -- --  06/08/21 0000 (!) 165/150  -- -- 84 19 97 % -- --  06/07/21 2345 (!) 175/101 -- -- 83 18 98 % -- --  06/07/21 2315 (!) 160/101 -- -- 82 -- 97 % -- --  06/07/21 2230 (!) 174/99 -- -- 81 -- 97 % -- --  06/07/21 2215 (!) 173/89 -- -- 82 -- 98 % -- --  06/07/21 2205 (!) 162/94 -- -- 81 -- 99 % -- --  06/07/21 2135 (!) 152/105 98.3 F (36.8 C) Oral 90 18 99 % -- --  06/07/21 2131 -- -- -- -- -- -- 5\' 3"  (1.6 m) 44.7 kg    1:11 AM Reevaluation with update and discussion. After initial assessment and treatment, an updated evaluation reveals no change in clinical status.  Findings discussed with patient's husband and all questions were answered. Daleen Bo   Medical Decision Making:  This patient is presenting for evaluation of weakness and confusion, which does require a range of treatment options, and is a complaint that involves a moderate risk of morbidity and mortality. The differential diagnoses include acute illness, worsening Parkinson's disease, failure to thrive. I decided to review old records, and in summary elderly female with Parkinson's disease, presenting for weakness and confusion by report of husband.  She is debilitated.  She has a history of hypertension, lymphoma, breast cancer, encephalopathy I obtained additional historical information from husband at bedside.  Clinical Laboratory Tests Ordered, included CBC, Metabolic panel, Urinalysis, and lactate, viral panel . Review indicates normal except glucose slightly high, GFR slightly low, platelets minimally low.  Critical Interventions-clinical evaluation, laboratory testing, IV fluids, observation and reassessment  After These Interventions, the Patient was reevaluated and was found to have change from baseline, with confusion and weakness.  She is typically communicative but now was not communicating with her husband.  CRITICAL CARE-no Performed by: Daleen Bo  Nursing Notes Reviewed/ Care Coordinated Applicable Imaging  Reviewed Interpretation of Laboratory Data incorporated into ED treatment   Case discussed with hospitalist will admit the patient.  1:15 AM    Final Clinical Impression(s) / ED Diagnoses Final diagnoses:  Parkinson's disease ([place])  Weakness    Rx / DC Orders ED Discharge Orders     None        Daleen Bo, MD 06/08/21 0131

## 2021-06-07 NOTE — ED Triage Notes (Signed)
Patient BIB [facility] from home. Has dementia, increasing confusing, weak, not wanting to eat today per family. History of UTI's.   EMS vitals CBG 113 98.52F temp HR 80 98% Room air 180/110 20G left AC

## 2021-06-08 ENCOUNTER — Observation Stay (HOSPITAL_COMMUNITY): Payer: Medicare Other

## 2021-06-08 DIAGNOSIS — Z853 Personal history of malignant neoplasm of breast: Secondary | ICD-10-CM | POA: Diagnosis not present

## 2021-06-08 DIAGNOSIS — J40 Bronchitis, not specified as acute or chronic: Secondary | ICD-10-CM | POA: Diagnosis present

## 2021-06-08 DIAGNOSIS — E43 Unspecified severe protein-calorie malnutrition: Secondary | ICD-10-CM | POA: Diagnosis not present

## 2021-06-08 DIAGNOSIS — Z20822 Contact with and (suspected) exposure to covid-19: Secondary | ICD-10-CM | POA: Diagnosis not present

## 2021-06-08 DIAGNOSIS — J180 Bronchopneumonia, unspecified organism: Secondary | ICD-10-CM | POA: Diagnosis present

## 2021-06-08 DIAGNOSIS — G20 Parkinson's disease: Secondary | ICD-10-CM | POA: Diagnosis present

## 2021-06-08 DIAGNOSIS — E860 Dehydration: Secondary | ICD-10-CM | POA: Diagnosis present

## 2021-06-08 DIAGNOSIS — R443 Hallucinations, unspecified: Secondary | ICD-10-CM | POA: Diagnosis not present

## 2021-06-08 DIAGNOSIS — Z801 Family history of malignant neoplasm of trachea, bronchus and lung: Secondary | ICD-10-CM | POA: Diagnosis not present

## 2021-06-08 DIAGNOSIS — Z8572 Personal history of non-Hodgkin lymphomas: Secondary | ICD-10-CM | POA: Diagnosis not present

## 2021-06-08 DIAGNOSIS — J690 Pneumonitis due to inhalation of food and vomit: Secondary | ICD-10-CM | POA: Diagnosis not present

## 2021-06-08 DIAGNOSIS — R531 Weakness: Secondary | ICD-10-CM | POA: Diagnosis not present

## 2021-06-08 DIAGNOSIS — Z681 Body mass index (BMI) 19 or less, adult: Secondary | ICD-10-CM | POA: Diagnosis not present

## 2021-06-08 DIAGNOSIS — R627 Adult failure to thrive: Secondary | ICD-10-CM | POA: Diagnosis present

## 2021-06-08 DIAGNOSIS — F32A Depression, unspecified: Secondary | ICD-10-CM | POA: Diagnosis present

## 2021-06-08 DIAGNOSIS — G9341 Metabolic encephalopathy: Secondary | ICD-10-CM | POA: Diagnosis present

## 2021-06-08 DIAGNOSIS — I6782 Cerebral ischemia: Secondary | ICD-10-CM | POA: Diagnosis not present

## 2021-06-08 DIAGNOSIS — J90 Pleural effusion, not elsewhere classified: Secondary | ICD-10-CM | POA: Diagnosis not present

## 2021-06-08 DIAGNOSIS — R410 Disorientation, unspecified: Secondary | ICD-10-CM | POA: Diagnosis not present

## 2021-06-08 DIAGNOSIS — R1310 Dysphagia, unspecified: Secondary | ICD-10-CM | POA: Diagnosis present

## 2021-06-08 DIAGNOSIS — Z66 Do not resuscitate: Secondary | ICD-10-CM | POA: Diagnosis not present

## 2021-06-08 DIAGNOSIS — I160 Hypertensive urgency: Secondary | ICD-10-CM | POA: Diagnosis present

## 2021-06-08 DIAGNOSIS — J189 Pneumonia, unspecified organism: Secondary | ICD-10-CM | POA: Diagnosis not present

## 2021-06-08 DIAGNOSIS — F419 Anxiety disorder, unspecified: Secondary | ICD-10-CM | POA: Diagnosis present

## 2021-06-08 DIAGNOSIS — J4520 Mild intermittent asthma, uncomplicated: Secondary | ICD-10-CM | POA: Diagnosis present

## 2021-06-08 DIAGNOSIS — H9190 Unspecified hearing loss, unspecified ear: Secondary | ICD-10-CM | POA: Diagnosis present

## 2021-06-08 DIAGNOSIS — E119 Type 2 diabetes mellitus without complications: Secondary | ICD-10-CM | POA: Diagnosis present

## 2021-06-08 DIAGNOSIS — I951 Orthostatic hypotension: Secondary | ICD-10-CM | POA: Diagnosis present

## 2021-06-08 DIAGNOSIS — I739 Peripheral vascular disease, unspecified: Secondary | ICD-10-CM | POA: Diagnosis not present

## 2021-06-08 DIAGNOSIS — I10 Essential (primary) hypertension: Secondary | ICD-10-CM | POA: Diagnosis present

## 2021-06-08 DIAGNOSIS — I350 Nonrheumatic aortic (valve) stenosis: Secondary | ICD-10-CM | POA: Diagnosis present

## 2021-06-08 LAB — AMMONIA: Ammonia: 19 umol/L (ref 9–35)

## 2021-06-08 LAB — HEMOGLOBIN A1C
Hgb A1c MFr Bld: 4.9 % (ref 4.8–5.6)
Mean Plasma Glucose: 93.93 mg/dL

## 2021-06-08 LAB — VITAMIN B12: Vitamin B-12: 394 pg/mL (ref 180–914)

## 2021-06-08 LAB — TSH: TSH: 3.325 u[IU]/mL (ref 0.350–4.500)

## 2021-06-08 MED ORDER — SODIUM CHLORIDE 0.9 % IV SOLN: INTRAVENOUS | Status: AC

## 2021-06-08 MED ORDER — ACETAMINOPHEN 325 MG PO TABS: 650.0000 mg | ORAL_TABLET | Freq: Four times a day (QID) | ORAL | Status: DC | PRN

## 2021-06-08 MED ORDER — SODIUM CHLORIDE 0.9 % IV SOLN
100.0000 mg | Freq: Two times a day (BID) | INTRAVENOUS | Status: DC
  Administered 2021-06-08 – 2021-06-10 (×5): 100 mg via INTRAVENOUS
  Filled 2021-06-08 (×6): qty 100

## 2021-06-08 MED ORDER — CARBIDOPA-LEVODOPA 25-100 MG PO TABS
1.0000 | ORAL_TABLET | Freq: Three times a day (TID) | ORAL | Status: DC
  Administered 2021-06-08 – 2021-06-09 (×6): 1 via ORAL
  Filled 2021-06-08 (×6): qty 1

## 2021-06-08 MED ORDER — ACETAMINOPHEN 650 MG RE SUPP: 650.0000 mg | Freq: Four times a day (QID) | RECTAL | Status: DC | PRN

## 2021-06-08 MED ORDER — ACYCLOVIR 400 MG PO TABS
400.0000 mg | ORAL_TABLET | Freq: Two times a day (BID) | ORAL | Status: DC
  Administered 2021-06-08 – 2021-06-11 (×6): 400 mg via ORAL
  Filled 2021-06-08 (×7): qty 1

## 2021-06-08 MED ORDER — ALBUTEROL SULFATE (2.5 MG/3ML) 0.083% IN NEBU: 2.5000 mg | INHALATION_SOLUTION | Freq: Four times a day (QID) | RESPIRATORY_TRACT | Status: DC | PRN

## 2021-06-08 MED ORDER — HYDRALAZINE HCL 20 MG/ML IJ SOLN
5.0000 mg | INTRAMUSCULAR | Status: DC | PRN
  Administered 2021-06-09: 5 mg via INTRAVENOUS
  Filled 2021-06-08: qty 1

## 2021-06-08 MED ORDER — ENSURE ENLIVE PO LIQD
237.0000 mL | Freq: Two times a day (BID) | ORAL | Status: DC
Start: 2021-06-08 — End: 2021-06-09
  Administered 2021-06-09: 237 mL via ORAL

## 2021-06-08 MED ORDER — SODIUM CHLORIDE 0.9 % IV SOLN
1.0000 g | INTRAVENOUS | Status: DC
  Administered 2021-06-08 – 2021-06-10 (×3): 1 g via INTRAVENOUS
  Filled 2021-06-08 (×3): qty 10

## 2021-06-08 MED ORDER — SERTRALINE HCL 100 MG PO TABS
100.0000 mg | ORAL_TABLET | Freq: Every day | ORAL | Status: DC
  Administered 2021-06-09 – 2021-06-11 (×3): 100 mg via ORAL
  Filled 2021-06-08 (×4): qty 1

## 2021-06-08 MED ORDER — ENOXAPARIN SODIUM 30 MG/0.3ML IJ SOSY
30.0000 mg | PREFILLED_SYRINGE | INTRAMUSCULAR | Status: DC
  Administered 2021-06-08 – 2021-06-11 (×4): 30 mg via SUBCUTANEOUS
  Filled 2021-06-08 (×4): qty 0.3

## 2021-06-08 NOTE — Progress Notes (Signed)
   06/08/21 0540  Assess: MEWS Score  Temp 97.9 F (36.6 C)  BP (!) 151/105  Pulse Rate (!) 48  Resp 20  SpO2 96 %  O2 Device Room Air  Assess: MEWS Score  MEWS Temp 0  MEWS Systolic 0  MEWS Pulse 1  MEWS RR 0  MEWS LOC 1  MEWS Score 2  MEWS Score Color Yellow  Assess: SIRS CRITERIA  SIRS Temperature  0  SIRS Pulse 0  SIRS Respirations  0  SIRS WBC 0  SIRS Score Sum  0  Charge nurse notified as well as MD, [place] via page. Pule and LOC cause of yellow mews. Order placed by physician are in progress. Will continue to monitor.

## 2021-06-08 NOTE — Progress Notes (Signed)
Husband at the bedside, verified that per wife and his wishes that she is to be made DNR. DNR form given to Dr Erlinda Hong to sign.

## 2021-06-08 NOTE — Progress Notes (Signed)
PT Cancellation Note  Patient Details Name: [name] MRN: [id number] DOB: 1927/08/02   Cancelled Treatment:     PT order received but eval deferred this date.  RN advises pt very lethargic and she has been unable to get her to take her Sinemet.  Spouse is present in room and reports pt very lethargic and will be limited in ability to participate with therapy or "show you what she can actually do.".  Will follow.   Jaythan Hinely 06/08/2021, 3:25 PM

## 2021-06-08 NOTE — Evaluation (Signed)
Clinical/Bedside Swallow Evaluation Patient Details  Name: [name] MRN: [id number] Date of Birth: [birth year]  Today's Date: 06/08/2021 Time: SLP Start Time (ACUTE ONLY): 1450 SLP Stop Time (ACUTE ONLY): 1515 SLP Time Calculation (min) (ACUTE ONLY): 25 min  Past Medical History:  Past Medical History:  Diagnosis Date   Anxiety    Asthma    Depression    Diabetes mellitus without complication ([place])    08/01/24.Marland KitchenMarland Kitchenpt denies   GERD (gastroesophageal reflux disease)    Hearing loss    Hypertension    NHL (non-Hodgkin's lymphoma) ([place])    nhl dx 9/04 breast ca dx1/12   Parkinson disease [facility])    Past Surgical History:  Past Surgical History:  Procedure Laterality Date   Ba-HA Ear implant     ESOPHAGOGASTRODUODENOSCOPY N/A 02/04/2017   Procedure: ESOPHAGOGASTRODUODENOSCOPY (EGD);  Surgeon: Arta Silence, MD;  Location: [facility];  Service: Endoscopy;  Laterality: N/A;   IR THORACENTESIS ASP PLEURAL SPACE W/IMG GUIDE  01/30/2020   MASTECTOMY  2 /8/ 76   bilateral   MASTECTOMY     HPI:  87 y.o. female with medical history significant of Parkinson's disease, asthma, type 2 diabetes, depression, anxiety, GERD, hypertension, moderate to severe aortic stenosis, non-Hodgkin's lymphoma in remission presented to the ED via EMS for evaluation of confusion, generalized weakness, and poor oral intake.  Family reported baseline weakness and confusion related to Parkinson's disease but confusion has increased over the last 36 hours.  No fever, cough, or vomiting reported.  Normally uses a walker to ambulate but was not able to get up from the couch yesterday and family had to call EMS. Prior MBS on 06/16/17 suggesting mild pharyngeal retention and recommended a regular/thin liquid diet with repetitive swallows and self regulated solids per pt tolerance.  CXR on 06/08/21 concerning for severe bronchitis and developing multilobar PNA, most severe in LLL.  Currently on a heart healthy  diet/thin liquids.  Nursing concerned about coughing with medication administration.  BSE generated.   Assessment / Plan / Recommendation  Clinical Impression  Pt seen for clinical swallowing evaluation with cognitive-based oropharyngeal dysphagia noted characterized by oral holding/prolonged oral transit with all consistencies and immediate cough noted with thin via guided sips from straw.  Delayed throat clearing observed with tsp amounts of thin.  Pt consumed nectar-thickened liquids via straw without overt s/s of aspiration noted.  Delay in the overall initiation of the swallow seen and expected somewhat for age d/t presbyphagia and dx of Parkinson's disease.  Nursing gave pt medication crushed in puree with good tolerance during assessment.  Recommend downgrading diet to Dysphagia 3 for ease of transition and nectar-thickened liquids with guided sips via straw until mentation improves.  ST will f/u for diet tolerance/education re: swallowing/aspiration precautions and family education re: swallowing deficits.  Thank you for this consult. SLP Visit Diagnosis: Dysphagia, oropharyngeal phase (R13.12)    Aspiration Risk  Mild aspiration risk;Moderate aspiration risk    Diet Recommendation   Dysphagia 3/nectar-thickened liquids  Medication Administration: Crushed with puree    Other  Recommendations Oral Care Recommendations: Oral care BID;Staff/trained caregiver to provide oral care    Recommendations for follow up therapy are one component of a multi-disciplinary discharge planning process, led by the attending physician.  Recommendations may be updated based on patient status, additional functional criteria and insurance authorization.  Follow up Recommendations Other (comment) (TBD)      Assistance Recommended at Discharge Frequent or constant Supervision/Assistance  Functional Status Assessment Patient has had  a recent decline in their functional status and demonstrates the ability to  make significant improvements in function in a reasonable and predictable amount of time.  Frequency and Duration min 2x/week  1 week       Prognosis Prognosis for Safe Diet Advancement: Good Barriers to Reach Goals: Cognitive deficits      Swallow Study   General Date of Onset: 06/08/21 HPI: 87 y.o. female with medical history significant of Parkinson's disease, asthma, type 2 diabetes, depression, anxiety, GERD, hypertension, moderate to severe aortic stenosis, non-Hodgkin's lymphoma in remission presented to the ED via EMS for evaluation of confusion, generalized weakness, and poor oral intake.  Family reported baseline weakness and confusion related to Parkinson's disease but confusion has increased over the last 36 hours.  No fever, cough, or vomiting reported.  Normally uses a walker to ambulate but was not able to get up from the couch yesterday and family had to call EMS. Type of Study: Bedside Swallow Evaluation Previous Swallow Assessment: 06/16/17 MBS revealed mild pharyngeal retention, but regular/thin liquid diet advised with repetitive dry swallows and self regulation of solids. Diet Prior to this Study: Regular;Thin liquids Temperature Spikes Noted: No Respiratory Status: Room air History of Recent Intubation: No Behavior/Cognition: Alert;Confused;Distractible;Requires cueing Oral Cavity Assessment: Within Functional Limits Oral Care Completed by SLP: Recent completion by staff Oral Cavity - Dentition: Adequate natural dentition;Dentures, bottom Self-Feeding Abilities: Able to feed self;Needs assist Patient Positioning: Upright in bed Baseline Vocal Quality: Low vocal intensity;Breathy Volitional Cough: Strong Volitional Swallow: Unable to elicit    Oral/Motor/Sensory Function Overall Oral Motor/Sensory Function: Generalized oral weakness   Ice Chips Ice chips: Impaired Presentation: Spoon Oral Phase Functional Implications: Oral holding;Prolonged oral  transit Pharyngeal Phase Impairments: Suspected delayed Swallow   Thin Liquid Thin Liquid: Impaired Presentation: Spoon;Straw Oral Phase Functional Implications: Oral holding Pharyngeal  Phase Impairments: Suspected delayed Swallow;Cough - Immediate;Throat Clearing - Delayed Other Comments: delayed throat clearing with tsp; immediate cough with guided sips of thin with straw    Nectar Thick Nectar Thick Liquid: Impaired Presentation: Straw Oral phase functional implications: Oral holding Pharyngeal Phase Impairments: Suspected delayed Swallow   Honey Thick Honey Thick Liquid: Not tested   Puree Puree: Impaired Presentation: Self Fed Oral Phase Functional Implications: Oral holding Pharyngeal Phase Impairments: Suspected delayed Swallow   Solid     Solid: Impaired Presentation: Self Fed Oral Phase Impairments: Impaired mastication Oral Phase Functional Implications: Impaired mastication;Prolonged oral transit      Elvina Sidle, M.S., CCC-SLP 06/08/2021,3:49 PM

## 2021-06-08 NOTE — Progress Notes (Signed)
OT Cancellation Note  Patient Details Name: [name] MRN: [id number] DOB: 1928-02-04   Cancelled Treatment:    Reason Eval/Treat Not Completed: Other (comment) patient is very lethargic today. Patient pending SLP eval for swallow when approached for therapy. Patients husband said seeing patient tomorrow would be better when she can really "s how you what she can do".  Jackelyn Poling OTR/L, [place] [facility] Office# [phone] Pager# [phone]   06/08/2021, 3:52 PM

## 2021-06-08 NOTE — ED Notes (Signed)
ED TO INPATIENT HANDOFF REPORT  ED Nurse Name and Phone #:   S Name/Age/Gender [name] 87 y.o. female Room/Bed: WA18/WA18  Code Status   Code Status: Prior  Home/SNF/Other Home Patient oriented to: self Is this baseline? Yes   Triage Complete: Triage complete  Chief Complaint Generalized weakness [R53.1]  Triage Note Patient BIB [facility] from home. Has dementia, increasing confusing, weak, not wanting to eat today per family. History of UTI's.   EMS vitals CBG 113 98.60F temp HR 80 98% Room air 180/110 20G left AC   Allergies Allergies  Allergen Reactions   Azithromycin Other (See Comments)    confusion   Tramadol Other (See Comments)    Hallucinations    Level of Care/Admitting Diagnosis ED Disposition     ED Disposition  Admit   Condition  --   Comment  Hospital Area: [place] [100102]  Level of Care: Med-Surg [16]  May place patient in observation at [facility] or [place] if equivalent level of care is available:: Yes  Covid Evaluation: Asymptomatic Screening Protocol (No Symptoms)  Diagnosis: Generalized weakness [115726]  Admitting Physician: Shela Leff [[id number]]  Attending Physician: Shela Leff [[id number]]          B Medical/Surgery History Past Medical History:  Diagnosis Date   Anxiety    Asthma    Depression    Diabetes mellitus without complication ([place])    3/64/68.Marland KitchenMarland Kitchenpt denies   GERD (gastroesophageal reflux disease)    Hearing loss    Hypertension    NHL (non-Hodgkin's lymphoma) ([place])    nhl dx 9/04 breast ca dx1/12   Parkinson disease [facility])    Past Surgical History:  Procedure Laterality Date   Ba-HA Ear implant     ESOPHAGOGASTRODUODENOSCOPY N/A 02/04/2017   Procedure: ESOPHAGOGASTRODUODENOSCOPY (EGD);  Surgeon: Arta Silence, MD;  Location: [facility];  Service: Endoscopy;  Laterality: N/A;   IR THORACENTESIS ASP PLEURAL SPACE W/IMG GUIDE  01/30/2020   MASTECTOMY  2  /8/ 12   bilateral   MASTECTOMY       A IV Location/Drains/Wounds Patient Lines/Drains/Airways Status     Active Line/Drains/Airways     Name Placement date Placement time Site Days   Peripheral IV 06/07/21 20 G Left Antecubital 06/07/21  2137  Antecubital  1   External Urinary Catheter 01/31/20  1843  --  494            Intake/Output Last 24 hours  Intake/Output Summary (Last 24 hours) at 06/08/2021 0130 Last data filed at 06/07/2021 2350 Gross per 24 hour  Intake 500 ml  Output --  Net 500 ml    Labs/Imaging Results for orders placed or performed during the hospital encounter of 06/07/21 (from the past 48 hour(s))  Comprehensive metabolic panel     Status: Abnormal   Collection Time: 06/07/21  9:38 PM  Result Value Ref Range   Sodium 137 135 - 145 mmol/L   Potassium 4.4 3.5 - 5.1 mmol/L   Chloride 105 98 - 111 mmol/L   CO2 27 22 - 32 mmol/L   Glucose, Bld 104 (H) 70 - 99 mg/dL    Comment: Glucose reference range applies only to samples taken after fasting for at least 8 hours.   BUN 19 8 - 23 mg/dL   Creatinine, Ser 0.94 0.44 - 1.00 mg/dL   Calcium 9.0 8.9 - 10.3 mg/dL   Total Protein 7.1 6.5 - 8.1 g/dL   Albumin 3.8 3.5 - 5.0 g/dL  AST 19 15 - 41 U/L   ALT 12 0 - 44 U/L   Alkaline Phosphatase 86 38 - 126 U/L   Total Bilirubin 0.8 0.3 - 1.2 mg/dL   GFR, Estimated 57 (L) >60 mL/min    Comment: (NOTE) Calculated using the CKD-EPI Creatinine Equation (2021)    Anion gap 5 5 - 15    Comment: Performed at [facility], [place] [address] [place], [place] [address]  CBC     Status: Abnormal   Collection Time: 06/07/21  9:38 PM  Result Value Ref Range   WBC 5.0 4.0 - 10.5 K/uL   RBC 4.30 3.87 - 5.11 MIL/uL   Hemoglobin 12.5 12.0 - 15.0 g/dL   HCT 37.6 36.0 - 46.0 %   MCV 87.4 80.0 - 100.0 fL   MCH 29.1 26.0 - 34.0 pg   MCHC 33.2 30.0 - 36.0 g/dL   RDW 13.4 11.5 - 15.5 %   Platelets 149 (L) 150 - 400 K/uL   nRBC 0.0 0.0 - 0.2 %     Comment: Performed at [facility], [place] [address], [place], [name] [address]  Lactic acid, plasma     Status: None   Collection Time: 06/07/21  9:38 PM  Result Value Ref Range   Lactic Acid, Venous 1.1 0.5 - 1.9 mmol/L    Comment: Performed at [facility], [place] [address], [place], [place] [address]  Urinalysis, Routine w reflex microscopic Urine, In & Out Cath     Status: Abnormal   Collection Time: 06/07/21  9:40 PM  Result Value Ref Range   Color, Urine AMBER (A) YELLOW    Comment: BIOCHEMICALS MAY BE AFFECTED BY COLOR   APPearance CLOUDY (A) CLEAR   Specific Gravity, Urine 1.018 1.005 - 1.030   pH 6.0 5.0 - 8.0   Glucose, UA NEGATIVE NEGATIVE mg/dL   Hgb urine dipstick NEGATIVE NEGATIVE   Bilirubin Urine NEGATIVE NEGATIVE   Ketones, ur 5 (A) NEGATIVE mg/dL   Protein, ur NEGATIVE NEGATIVE mg/dL   Nitrite NEGATIVE NEGATIVE   Leukocytes,Ua NEGATIVE NEGATIVE    Comment: Performed at [facility] [address], [place],  [address]  Resp Panel by RT-PCR (Flu A&B, Covid) Nasopharyngeal Swab     Status: None   Collection Time: 06/07/21 10:06 PM   Specimen: Nasopharyngeal Swab; Nasopharyngeal(NP) swabs in vial transport medium  Result Value Ref Range   SARS Coronavirus 2 by RT PCR NEGATIVE NEGATIVE    Comment: (NOTE) SARS-CoV-2 target nucleic acids are NOT DETECTED.  The SARS-CoV-2 RNA is generally detectable in upper respiratory specimens during the acute phase of infection. The lowest concentration of SARS-CoV-2 viral copies this assay can detect is 138 copies/mL. A negative result does not preclude SARS-Cov-2 infection and should not be used as the sole basis for treatment or other patient management decisions. A negative result may occur with  improper specimen collection/handling, submission of specimen other than nasopharyngeal swab, presence of viral mutation(s) within the areas targeted by this assay, and  inadequate number of viral copies(<138 copies/mL). A negative result must be combined with clinical observations, patient history, and epidemiological information. The expected result is Negative.  Fact Sheet for Patients:  EntrepreneurPulse.com.au  Fact Sheet for Healthcare Providers:  IncredibleEmployment.be  This test is no t yet approved or cleared by the [place] FDA and  has been authorized for detection and/or diagnosis of SARS-CoV-2 by FDA under an Emergency Use Authorization (EUA). This  EUA will remain  in effect (meaning this test can be used) for the duration of the COVID-19 declaration under Section 564(b)(1) of the Act, 21 U.S.C.section 360bbb-3(b)(1), unless the authorization is terminated  or revoked sooner.       Influenza A by PCR NEGATIVE NEGATIVE   Influenza B by PCR NEGATIVE NEGATIVE    Comment: (NOTE) The Xpert Xpress SARS-CoV-2/FLU/RSV plus assay is intended as an aid in the diagnosis of influenza from Nasopharyngeal swab specimens and should not be used as a sole basis for treatment. Nasal washings and aspirates are unacceptable for Xpert Xpress SARS-CoV-2/FLU/RSV testing.  Fact Sheet for Patients: EntrepreneurPulse.com.au  Fact Sheet for Healthcare Providers: IncredibleEmployment.be  This test is not yet approved or cleared by the [place] FDA and has been authorized for detection and/or diagnosis of SARS-CoV-2 by FDA under an Emergency Use Authorization (EUA). This EUA will remain in effect (meaning this test can be used) for the duration of the COVID-19 declaration under Section 564(b)(1) of the Act, 21 U.S.C. section 360bbb-3(b)(1), unless the authorization is terminated or revoked.  Performed at [facility], [place] [address], [place],  [address]    No results found.  Pending Labs Unresulted Labs (From admission, onward)     Start      Ordered   06/07/21 2140  Urine Culture  Once,   STAT       Question:  Indication  Answer:  Altered mental status (if no other cause identified)   06/07/21 2140            Vitals/Pain Today's Vitals   06/07/21 2345 06/08/21 0000 06/08/21 0015 06/08/21 0045  BP: (!) 175/101 (!) 165/150 (!) 187/93 (!) 188/108  Pulse: 83 84 86 86  Resp: 18 19  18   Temp:      TempSrc:      SpO2: 98% 97% 97% 97%  Weight:      Height:      PainSc:        Isolation Precautions No active isolations  Medications Medications  sodium chloride 0.9 % bolus 500 mL (0 mLs Intravenous Stopped 06/07/21 2350)    Mobility walks with person assist High fall risk   Focused Assessments    R Recommendations: See Admitting Provider Note  Report given to:   Additional Notes:

## 2021-06-08 NOTE — Progress Notes (Signed)
Late note entry: Patient seen earlier this am with husband at bedside, she is demented, on room air, will get speech eval, start aspiration precaution, continue abx  DNR status verified with husband.

## 2021-06-08 NOTE — H&P (Signed)
History and Physical    [name] TIR:[id number] DOB: 04/03/1928 DOA: 06/07/2021  PCP: [name], MD Patient coming from: Home  Chief Complaint: Altered mental status  HPI: [name] is a 87 y.o. female with medical history significant of Parkinson's disease, asthma, type 2 diabetes, depression, anxiety, GERD, hypertension, moderate to severe aortic stenosis, non-Hodgkin's lymphoma in remission presented to the ED via EMS for evaluation of confusion, generalized weakness, and poor oral intake.  Family reported baseline weakness and confusion related to Parkinson's disease but confusion has increased over the last 36 hours.  No fever, cough, or vomiting reported.  Normally uses a walker to ambulate but was not able to get up from the couch yesterday and family had to call EMS.  Not febrile or hypoxic.  Blood pressure elevated with systolic up to 676P.  Labs showing WBC 5.0, hemoglobin 12.5, platelet count 149k.  Sodium 137, potassium 4.4, chloride 105, bicarb 27, BUN 19, creatinine 0.9, glucose 104.  LFTs normal.  Lactic acid normal.  UA without signs of infection.  COVID and influenza PCR negative. Patient was given 500 cc normal saline bolus. Patient is somnolent but arousable.  Oriented to self only, not able to give any history.  Review of Systems:  All systems reviewed and apart from history of presenting illness, are negative.  Past Medical History:  Diagnosis Date   Anxiety    Asthma    Depression    Diabetes mellitus without complication ([place])    9/50/93.Marland KitchenMarland Kitchenpt denies   GERD (gastroesophageal reflux disease)    Hearing loss    Hypertension    NHL (non-Hodgkin's lymphoma) ([place])    nhl dx 9/04 breast ca dx1/12   Parkinson disease [facility])     Past Surgical History:  Procedure Laterality Date   Ba-HA Ear implant     ESOPHAGOGASTRODUODENOSCOPY N/A 02/04/2017   Procedure: ESOPHAGOGASTRODUODENOSCOPY (EGD);  Surgeon: Arta Silence, MD;  Location: [facility];   Service: Endoscopy;  Laterality: N/A;   IR THORACENTESIS ASP PLEURAL SPACE W/IMG GUIDE  01/30/2020   MASTECTOMY  2 /8/ 12   bilateral   MASTECTOMY       reports that she has never smoked. She has never used smokeless tobacco. She reports that she does not drink alcohol and does not use drugs.  Allergies  Allergen Reactions   Azithromycin Other (See Comments)    confusion   Tramadol Other (See Comments)    Hallucinations    Family History  Problem Relation Age of Onset   Lung cancer Father    Cancer Father        lung   Cancer Mother     Prior to Admission medications   Medication Sig Start Date End Date Taking? Authorizing Provider  acetaminophen (TYLENOL) 325 MG tablet Take 650 mg by mouth every 6 (six) hours as needed for mild pain, moderate pain, fever or headache.    Yes [provider]  acyclovir (ZOVIRAX) 400 MG tablet TAKE 1 TABLET BY MOUTH TWICE DAILY 02/03/21  Yes [name], MD  carbidopa-levodopa (SINEMET IR) 25-100 MG tablet TAKE 1 TABLET BY MOUTH DAILY EVERY MORNING, 1 IN THE AFTERNOON, AND 2 IN THE EVENING Patient taking differently: Take 1 tablet by mouth See admin instructions. TAKE 1 TABLET BY MOUTH DAILY EVERY MORNING, 1 IN THE AFTERNOON, AND 2 IN THE EVENING 02/03/21  Yes Tat, Eustace Quail, DO  cholecalciferol (VITAMIN D) 1000 units tablet Take 1,000 Units by mouth every evening.    Yes [provider]  docusate sodium (COLACE) 100 MG capsule Take 100 mg by mouth daily as needed for mild constipation.   Yes [provider]  sertraline (ZOLOFT) 100 MG tablet Take 1 tablet (100 mg total) by mouth daily. 03/04/18  Yes Tat, Eustace Quail, DO  Calcium Carbonate (CALCIUM-CARB 600 PO) Take 600 mg by mouth daily.    [provider]    Physical Exam: Vitals:   06/08/21 0015 06/08/21 0045 06/08/21 0204 06/08/21 0540  BP: (!) 187/93 (!) 188/108 (!) 180/114 (!) 151/105  Pulse: 86 86 91 (!) 48  Resp:  18 16 20   Temp:   (!) 97.3 F (36.3  C) 97.9 F (36.6 C)  TempSrc:   Oral Oral  SpO2: 97% 97% 96% 96%  Weight:      Height:   5\' 3"  (1.6 m)     Physical Exam Constitutional:      General: She is not in acute distress. HENT:     Head: Normocephalic and atraumatic.     Mouth/Throat:     Comments: Very dry mucous membranes Eyes:     Extraocular Movements: Extraocular movements intact.     Conjunctiva/sclera: Conjunctivae normal.  Cardiovascular:     Rate and Rhythm: Normal rate and regular rhythm.     Pulses: Normal pulses.     Heart sounds: Murmur heard.     Comments: Murmur best appreciated at the right upper sternal border second intercostal space Pulmonary:     Effort: Pulmonary effort is normal. No respiratory distress.     Breath sounds: No wheezing or rales.  Abdominal:     General: Bowel sounds are normal. There is no distension.     Palpations: Abdomen is soft.     Tenderness: There is no abdominal tenderness.  Musculoskeletal:        General: No swelling or tenderness.     Cervical back: Normal range of motion and neck supple.  Skin:    General: Skin is warm and dry.     Comments: Decreased skin turgor  Neurological:     Mental Status: She is alert.     Comments: Somnolent but arousable Oriented to self only Moving all extremities on command, no focal weakness     Labs on Admission: I have personally reviewed following labs and imaging studies  CBC: Recent Labs  Lab 06/07/21 2138  WBC 5.0  HGB 12.5  HCT 37.6  MCV 87.4  PLT 295*   Basic Metabolic Panel: Recent Labs  Lab 06/07/21 2138  NA 137  K 4.4  CL 105  CO2 27  GLUCOSE 104*  BUN 19  CREATININE 0.94  CALCIUM 9.0   GFR: Estimated Creatinine Clearance: 27.5 mL/min (by C-G formula based on SCr of 0.94 mg/dL). Liver Function Tests: Recent Labs  Lab 06/07/21 2138  AST 19  ALT 12  ALKPHOS 86  BILITOT 0.8  PROT 7.1  ALBUMIN 3.8   No results for input(s): LIPASE, AMYLASE in the last 168 hours. No results for input(s):  AMMONIA in the last 168 hours. Coagulation Profile: No results for input(s): INR, PROTIME in the last 168 hours. Cardiac Enzymes: No results for input(s): CKTOTAL, CKMB, CKMBINDEX, TROPONINI in the last 168 hours. BNP (last 3 results) No results for input(s): PROBNP in the last 8760 hours. HbA1C: No results for input(s): HGBA1C in the last 72 hours. CBG: No results for input(s): GLUCAP in the last 168 hours. Lipid Profile: No results for input(s): CHOL, HDL, LDLCALC, TRIG, CHOLHDL,  LDLDIRECT in the last 72 hours. Thyroid Function Tests: No results for input(s): TSH, T4TOTAL, FREET4, T3FREE, THYROIDAB in the last 72 hours. Anemia Panel: No results for input(s): VITAMINB12, FOLATE, FERRITIN, TIBC, IRON, RETICCTPCT in the last 72 hours. Urine analysis:    Component Value Date/Time   COLORURINE AMBER (A) 06/07/2021 2140   APPEARANCEUR CLOUDY (A) 06/07/2021 2140   LABSPEC 1.018 06/07/2021 2140   LABSPEC 1.015 06/05/2008 0907   PHURINE 6.0 06/07/2021 2140   GLUCOSEU NEGATIVE 06/07/2021 2140   HGBUR NEGATIVE 06/07/2021 2140   BILIRUBINUR NEGATIVE 06/07/2021 2140   BILIRUBINUR Negative 06/05/2008 0907   KETONESUR 5 (A) 06/07/2021 2140   PROTEINUR NEGATIVE 06/07/2021 2140   UROBILINOGEN 0.2 09/20/2012 0117   NITRITE NEGATIVE 06/07/2021 2140   LEUKOCYTESUR NEGATIVE 06/07/2021 2140   LEUKOCYTESUR Negative 06/05/2008 0907    Radiological Exams on Admission: CT HEAD WO CONTRAST (5MM)  Result Date: 06/08/2021 CLINICAL DATA:  Delirium and hallucination EXAM: CT HEAD WITHOUT CONTRAST TECHNIQUE: Contiguous axial images were obtained from the base of the skull through the vertex without intravenous contrast. COMPARISON:  11/11/2020 FINDINGS: Brain: No evidence of acute infarction, hemorrhage, hydrocephalus, extra-axial collection or mass lesion/mass effect. Atrophy and extensive chronic small vessel ischemia. Vascular: No hyperdense vessel or unexpected calcification. Skull: Mastoidectomy on  the right with clear bulb. Sinuses/Orbits: Negative for acute finding. Bilateral cataract resection. IMPRESSION: 1. No acute or reversible finding. 2. Atrophy and chronic small vessel disease. Electronically Signed   By: Jorje Guild M.D.   On: 06/08/2021 06:51   DG CHEST PORT 1 VIEW  Result Date: 06/08/2021 CLINICAL DATA:  87 year old female with history of altered mental status. EXAM: PORTABLE CHEST 1 VIEW COMPARISON:  Chest x-ray 11/11/2020. FINDINGS: Lung volumes are low. Opacity at the left base may reflect atelectasis and/or consolidation. Small left and trace right pleural effusions. Diffuse interstitial prominence and widespread peribronchial cuffing. Ill-defined opacities are also noted in the region of the left upper lobe. No pneumothorax. No evidence of pulmonary edema. Heart size is upper limits of normal. The patient is rotated to the left on today's exam, resulting in distortion of the mediastinal contours and reduced diagnostic sensitivity and specificity for mediastinal pathology. Atherosclerotic calcifications in the thoracic aorta. Surgical clips along the chest wall and in the axillary regions bilaterally. IMPRESSION: 1. The appearance the chest is concerning for severe bronchitis and developing multilobar bronchopneumonia, most severe in the left lower lobe. 2. Small left and trace right pleural effusions. 3. Aortic atherosclerosis. Electronically Signed   By: Vinnie Langton M.D.   On: 06/08/2021 06:50    EKG: Ordered and currently pending.  Assessment/Plan Principal Problem:   CAP (community acquired pneumonia) Active Problems:   Parkinson's disease ([place])   Hypertensive urgency   Acute metabolic encephalopathy   Generalized weakness   Community-acquired pneumonia Chest x-ray was done for encephalopathy work-up and revealed severe bronchitis and developing multilobar bronchopneumonia.  No fever, leukocytosis, or lactic acidosis to suggest sepsis.  Not hypoxic.  COVID and  influenza PCR negative. -Draw blood cultures and start antibiotics (ceftriaxone and doxycycline).  Bronchodilator as needed.  Supplemental oxygen as needed.  Acute metabolic encephalopathy Likely precipitated by infection and dehydration.  CT head negative for acute finding.  Moving all extremities on command, no focal weakness.  No meningeal signs. -IV fluid hydration and antibiotics for pneumonia.  Check TSH, B12, and ammonia levels.  Hypertensive urgency Blood pressure elevated with systolic up to 762U.  Currently not on any antihypertensives.  She  was previously on amlodipine and lisinopril which were discontinued due to orthostatic hypotension. -IV hydralazine PRN SBP >170 or DBP >110.  Monitor blood pressure closely, if it continues to be elevated during this hospitalization then consider starting low-dose amlodipine.  Parkinson's disease -Continue Sinemet  Anxiety, depression -Continue Zoloft  Mild intermittent asthma Not on any home inhalers.  Not wheezing. -Albuterol nebulizer as needed  Diet controlled type 2 diabetes A1c 5.2 in June 2021. -Repeat A1c  History of herpes zoster -Continue prophylactic acyclovir  Physical deconditioning -PT/OT eval, fall precautions  DVT prophylaxis: Lovenox Code Status: DNR based on documentation from prior hospitalizations.  No family available at this time.  Please confirm CODE STATUS in the morning. Disposition Plan: Status is: Observation  The patient remains OBS appropriate and will d/c before 2 midnights.  Level of care: Level of care: Telemetry  The medical decision making on this patient was of high complexity and the patient is at high risk for clinical deterioration, therefore this is a level 3 visit.  Shela Leff MD [facility]  If 7PM-7AM, please contact night-coverage [email or web address]  06/08/2021, 7:32 AM

## 2021-06-09 DIAGNOSIS — J189 Pneumonia, unspecified organism: Secondary | ICD-10-CM | POA: Diagnosis not present

## 2021-06-09 LAB — BASIC METABOLIC PANEL
Anion gap: 11 (ref 5–15)
BUN: 12 mg/dL (ref 8–23)
CO2: 23 mmol/L (ref 22–32)
Calcium: 8.5 mg/dL — ABNORMAL LOW (ref 8.9–10.3)
Chloride: 103 mmol/L (ref 98–111)
Creatinine, Ser: 0.62 mg/dL (ref 0.44–1.00)
GFR, Estimated: >60 mL/min (ref >60)
Glucose, Bld: 74 mg/dL (ref 70–99)
Potassium: 3.5 mmol/L (ref 3.5–5.1)
Sodium: 137 mmol/L (ref 135–145)

## 2021-06-09 MED ORDER — FOOD THICKENER (SIMPLYTHICK)
1.0000 | ORAL | Status: DC | PRN
  Filled 2021-06-09: qty 1

## 2021-06-09 MED ORDER — LABETALOL HCL 5 MG/ML IV SOLN
10.0000 mg | INTRAVENOUS | Status: DC | PRN
  Filled 2021-06-09: qty 4

## 2021-06-09 MED ORDER — AMLODIPINE BESYLATE 10 MG PO TABS
10.0000 mg | ORAL_TABLET | Freq: Every day | ORAL | Status: DC
  Administered 2021-06-09 – 2021-06-11 (×3): 10 mg via ORAL
  Filled 2021-06-09 (×3): qty 1

## 2021-06-09 MED ORDER — ENSURE ENLIVE PO LIQD
237.0000 mL | Freq: Three times a day (TID) | ORAL | Status: DC
  Administered 2021-06-09 – 2021-06-11 (×6): 237 mL via ORAL

## 2021-06-09 MED ORDER — ADULT MULTIVITAMIN W/MINERALS CH
1.0000 | ORAL_TABLET | Freq: Every day | ORAL | Status: DC
  Administered 2021-06-09 – 2021-06-11 (×3): 1 via ORAL
  Filled 2021-06-09 (×3): qty 1

## 2021-06-09 NOTE — Progress Notes (Signed)
PROGRESS NOTE    [name]  JSH:[id number] DOB: [birth year] DOA: 06/07/2021 PCP: [name], MD   Chief Complain: Altered mental status  Brief Narrative: Patient is a 87 year old female with history of Parkinson's disease, asthma, diabetes type 2, depression, anxiety, GERD, hypertension, moderate to severe aortic stenosis, non-Hodgkin's lymphoma in remission who presented with confusion, generalized weakness, poor oral intake from home.  On presentation she was hypertensive.  UA was without any signs of infection.  COVID/influenza PCR negative.  Chest imaging showed severe bronchitis, developing multi lobar bronchopneumonia most severe on the left lower lobe.  Aspiration pneumonia was also suspected, speech therapy consulted.  Currently on IV antibiotics.  Assessment & Plan:   Principal Problem:   CAP (community acquired pneumonia) Active Problems:   Parkinson's disease ([place])   Hypertensive urgency   Acute metabolic encephalopathy   Generalized weakness   FTT (failure to thrive) in adult   Community-acquired pneumonia/possible aspiration pneumonia: Presented with weakness, confusion.  Chest imaging was concerning for severe bronchitis and developing multilobar bronchopneumonia, most severe in the left lower lobe.  No fever or leukocytosis.  She was not hypoxic on presentation.  COVID/influenza patient negative.  Started on ceftriaxone and doxycycline which we will continue for today.  We will change antibiotics to oral tomorrow. Continue bronchodilators as needed.  Currently she is on room air.  Dysphagia: Speech therapy evaluated the patient recommended dysphagia 3 diet.  Acute metabolic encephalopathy: Secondary to pneumonia.  CT head negative for acute findings.  No focal weakness.  No meningeal signs.  Continue IV antibiotics for pneumonia.  Continue to monitor mental status  Severe hypertension: Blood pressure constantly elevated.  Not on any antihypertensives at  home.  She was previously taking amlodipine and lisinopril which were discontinued to orthostatic hypotension.  Continue PRN medications for severe hypertension.  Started on amlodipine 10 mg daily  Parkinson's disease: On Sinemet  Anxiety/depression: On Zoloft  Mild intermittent asthma: Not on any home inhalers.  She was not wheezing on presentation.  Continue bronchodilators as needed  Diet controlled type 2 diabetes: Recent hemoglobin A1c of 5.2.  Continue monitor blood sugars  History of herpes zoster: On prophylactic acyclovir.  Physical deconditioning: PT/OT evaluation done, recommended home health upon discharge            DVT prophylaxis:Lovenox Code Status: DNR Family Communication: Husband at bedside Status is: Inpatient  Remains inpatient appropriate because: Elderly patient with multilobar pneumonia requiring IV antibiotics     Consultants: None  Procedures: None  Antimicrobials:  Anti-infectives (From admission, onward)    Start     Dose/Rate Route Frequency Ordered Stop   06/08/21 1000  acyclovir (ZOVIRAX) tablet 400 mg        400 mg Oral 2 times daily 06/08/21 0522     06/08/21 0900  doxycycline (VIBRAMYCIN) 100 mg in sodium chloride 0.9 % 250 mL IVPB        100 mg 125 mL/hr over 120 Minutes Intravenous Every 12 hours 06/08/21 0715     06/08/21 0800  cefTRIAXone (ROCEPHIN) 1 g in sodium chloride 0.9 % 100 mL IVPB        1 g 200 mL/hr over 30 Minutes Intravenous Every 24 hours 06/08/21 0714         Subjective:  Patient seen and examined at the bedside this afternoon.  Hemodynamically stable.  She was sleeping when I arrived into the room.  Husband at the bedside.  She is alert, awake but not oriented.  On room air  Objective: Vitals:   06/08/21 1248 06/08/21 1634 06/08/21 2151 06/09/21 0637  BP: (!) 180/111 (!) 138/112 (!) 152/100 (!) 187/106  Pulse: 94 83 87 83  Resp: 18  16   Temp: 97.8 F (36.6 C)  98 F (36.7 C) 97.6 F (36.4 C)   TempSrc: Oral  Oral Oral  SpO2: 99% 95% 96% 96%  Weight:      Height:        Intake/Output Summary (Last 24 hours) at 06/09/2021 1351 Last data filed at 06/09/2021 0645 Gross per 24 hour  Intake 1081.18 ml  Output 900 ml  Net 181.18 ml   Filed Weights   06/07/21 2131  Weight: 44.7 kg    Examination:  General exam: Overall comfortable, not in distress, pleasantly confused, frail, debilitated HEENT: PERRL Respiratory system:  diminished air sounds bilaterally,no wheezes or crackles  Cardiovascular system: S1 & S2 heard, RRR.  Gastrointestinal system: Abdomen is nondistended, soft and nontender. Central nervous system: Alert and oriented Extremities: No edema, no clubbing ,no cyanosis Skin: No rashes, no ulcers,no icterus     Data Reviewed: I have personally reviewed following labs and imaging studies  CBC: Recent Labs  Lab 06/07/21 2138  WBC 5.0  HGB 12.5  HCT 37.6  MCV 87.4  PLT 387*   Basic Metabolic Panel: Recent Labs  Lab 06/07/21 2138 06/09/21 0515  NA 137 137  K 4.4 3.5  CL 105 103  CO2 27 23  GLUCOSE 104* 74  BUN 19 12  CREATININE 0.94 0.62  CALCIUM 9.0 8.5*   GFR: Estimated Creatinine Clearance: 32.3 mL/min (by C-G formula based on SCr of 0.62 mg/dL). Liver Function Tests: Recent Labs  Lab 06/07/21 2138  AST 19  ALT 12  ALKPHOS 86  BILITOT 0.8  PROT 7.1  ALBUMIN 3.8   No results for input(s): LIPASE, AMYLASE in the last 168 hours. Recent Labs  Lab 06/08/21 0615  AMMONIA 19   Coagulation Profile: No results for input(s): INR, PROTIME in the last 168 hours. Cardiac Enzymes: No results for input(s): CKTOTAL, CKMB, CKMBINDEX, TROPONINI in the last 168 hours. BNP (last 3 results) No results for input(s): PROBNP in the last 8760 hours. HbA1C: Recent Labs    06/08/21 0615  HGBA1C 4.9   CBG: No results for input(s): GLUCAP in the last 168 hours. Lipid Profile: No results for input(s): CHOL, HDL, LDLCALC, TRIG, CHOLHDL,  LDLDIRECT in the last 72 hours. Thyroid Function Tests: Recent Labs    06/08/21 0615  TSH 3.325   Anemia Panel: Recent Labs    06/08/21 0615  VITAMINB12 394   Sepsis Labs: Recent Labs  Lab 06/07/21 2138  LATICACIDVEN 1.1    Recent Results (from the past 240 hour(s))  Urine Culture     Status: None (Preliminary result)   Collection Time: 06/07/21  9:40 PM   Specimen: In/Out Cath Urine  Result Value Ref Range Status   Specimen Description   Final    IN/OUT CATH URINE Performed at [facility] [address] [place], [place] [address]    Special Requests   Final    NONE Performed at [facility], [place] [address] [place], [place] [address]    Culture   Final    IDENTIFICATION TO FOLLOW Performed at [facility], [place] [address] [place], [place] [address]    Report Status PENDING  Incomplete  Resp Panel by RT-PCR (Flu A&B, Covid) Nasopharyngeal Swab  Status: None   Collection Time: 06/07/21 10:06 PM   Specimen: Nasopharyngeal Swab; Nasopharyngeal(NP) swabs in vial transport medium  Result Value Ref Range Status   SARS Coronavirus 2 by RT PCR NEGATIVE NEGATIVE Final    Comment: (NOTE) SARS-CoV-2 target nucleic acids are NOT DETECTED.  The SARS-CoV-2 RNA is generally detectable in upper respiratory specimens during the acute phase of infection. The lowest concentration of SARS-CoV-2 viral copies this assay can detect is 138 copies/mL. A negative result does not preclude SARS-Cov-2 infection and should not be used as the sole basis for treatment or other patient management decisions. A negative result may occur with  improper specimen collection/handling, submission of specimen other than nasopharyngeal swab, presence of viral mutation(s) within the areas targeted by this assay, and inadequate number of viral copies(<138 copies/mL). A negative result must be combined with clinical observations, patient history, and  epidemiological information. The expected result is Negative.  Fact Sheet for Patients:  EntrepreneurPulse.com.au  Fact Sheet for Healthcare Providers:  IncredibleEmployment.be  This test is no t yet approved or cleared by the [place] FDA and  has been authorized for detection and/or diagnosis of SARS-CoV-2 by FDA under an Emergency Use Authorization (EUA). This EUA will remain  in effect (meaning this test can be used) for the duration of the COVID-19 declaration under Section 564(b)(1) of the Act, 21 U.S.C.section 360bbb-3(b)(1), unless the authorization is terminated  or revoked sooner.       Influenza A by PCR NEGATIVE NEGATIVE Final   Influenza B by PCR NEGATIVE NEGATIVE Final    Comment: (NOTE) The Xpert Xpress SARS-CoV-2/FLU/RSV plus assay is intended as an aid in the diagnosis of influenza from Nasopharyngeal swab specimens and should not be used as a sole basis for treatment. Nasal washings and aspirates are unacceptable for Xpert Xpress SARS-CoV-2/FLU/RSV testing.  Fact Sheet for Patients: EntrepreneurPulse.com.au  Fact Sheet for Healthcare Providers: IncredibleEmployment.be  This test is not yet approved or cleared by the [place] FDA and has been authorized for detection and/or diagnosis of SARS-CoV-2 by FDA under an Emergency Use Authorization (EUA). This EUA will remain in effect (meaning this test can be used) for the duration of the COVID-19 declaration under Section 564(b)(1) of the Act, 21 U.S.C. section 360bbb-3(b)(1), unless the authorization is terminated or revoked.  Performed at [facility], [place] [address], [place], [place] [address]   Culture, blood (routine x 2)     Status: None (Preliminary result)   Collection Time: 06/08/21  7:58 AM   Specimen: BLOOD  Result Value Ref Range Status   Specimen Description   Final    BLOOD RIGHT  ANTECUBITAL Performed at [facility] [address], [place], [place] [address]    Special Requests   Final    BOTTLES DRAWN AEROBIC ONLY Blood Culture adequate volume Performed at [facility] [address], [place], [place] [address]    Culture   Final    NO GROWTH < 12 HOURS Performed at [facility] [address] [place], [place] [address]    Report Status PENDING  Incomplete  Culture, blood (routine x 2)     Status: None (Preliminary result)   Collection Time: 06/08/21  7:58 AM   Specimen: BLOOD  Result Value Ref Range Status   Specimen Description   Final    BLOOD BLOOD RIGHT HAND Performed at [facility] [address] [place], [place] [address]  Special Requests   Final    BOTTLES DRAWN AEROBIC ONLY Blood Culture adequate volume Performed at [facility] [address], [place], [place] [address]    Culture   Final    NO GROWTH < 12 HOURS Performed at [facility] [address], [place], [place] [address]    Report Status PENDING  Incomplete         Radiology Studies: CT HEAD WO CONTRAST (5MM)  Result Date: 06/08/2021 CLINICAL DATA:  Delirium and hallucination EXAM: CT HEAD WITHOUT CONTRAST TECHNIQUE: Contiguous axial images were obtained from the base of the skull through the vertex without intravenous contrast. COMPARISON:  11/11/2020 FINDINGS: Brain: No evidence of acute infarction, hemorrhage, hydrocephalus, extra-axial collection or mass lesion/mass effect. Atrophy and extensive chronic small vessel ischemia. Vascular: No hyperdense vessel or unexpected calcification. Skull: Mastoidectomy on the right with clear bulb. Sinuses/Orbits: Negative for acute finding. Bilateral cataract resection. IMPRESSION: 1. No acute or reversible finding. 2. Atrophy and chronic small vessel disease. Electronically Signed   By: Jorje Guild M.D.   On: 06/08/2021 06:51   DG CHEST  PORT 1 VIEW  Result Date: 06/08/2021 CLINICAL DATA:  87 year old female with history of altered mental status. EXAM: PORTABLE CHEST 1 VIEW COMPARISON:  Chest x-ray 11/11/2020. FINDINGS: Lung volumes are low. Opacity at the left base may reflect atelectasis and/or consolidation. Small left and trace right pleural effusions. Diffuse interstitial prominence and widespread peribronchial cuffing. Ill-defined opacities are also noted in the region of the left upper lobe. No pneumothorax. No evidence of pulmonary edema. Heart size is upper limits of normal. The patient is rotated to the left on today's exam, resulting in distortion of the mediastinal contours and reduced diagnostic sensitivity and specificity for mediastinal pathology. Atherosclerotic calcifications in the thoracic aorta. Surgical clips along the chest wall and in the axillary regions bilaterally. IMPRESSION: 1. The appearance the chest is concerning for severe bronchitis and developing multilobar bronchopneumonia, most severe in the left lower lobe. 2. Small left and trace right pleural effusions. 3. Aortic atherosclerosis. Electronically Signed   By: Vinnie Langton M.D.   On: 06/08/2021 06:50        Scheduled Meds:  acyclovir  400 mg Oral BID   amLODipine  10 mg Oral Daily   carbidopa-levodopa  1 tablet Oral TID PC   enoxaparin (LOVENOX) injection  30 mg Subcutaneous Q24H   feeding supplement  237 mL Oral TID BM   multivitamin with minerals  1 tablet Oral Daily   sertraline  100 mg Oral Daily   Continuous Infusions:  cefTRIAXone (ROCEPHIN)  IV 1 g (06/09/21 0940)   doxycycline (VIBRAMYCIN) IV 100 mg (06/09/21 1025)     LOS: 1 day    Time spent: 25 mins.More than 50% of that time was spent in counseling and/or coordination of care.      Shelly Coss, MD [facility] P11/14/2022, 1:51 PM

## 2021-06-09 NOTE — Evaluation (Signed)
Occupational Therapy Evaluation Patient Details Name: [name] MRN: [id number] DOB: 03-20-1928 Today's Date: 06/09/2021   History of Present Illness 87 y.o. female presented to the ED via EMS for evaluation of confusion, generalized weakness, and poor oral intake. Dx of CAP. Pt with medical history significant of Parkinson's disease, asthma, type 2 diabetes, depression, anxiety, GERD, hypertension, moderate to severe aortic stenosis, non-Hodgkin's lymphoma   Clinical Impression   Patient evaluated by Occupational Therapy with no further acute OT needs identified. All education has been completed and the patient has no further questions. Patient is at baseline for ADL tasks at this time. Patients husband was educated and all questions were answered.  See below for any follow-up Occupational Therapy or equipment needs. OT is signing off. Thank you for this referral.       Recommendations for follow up therapy are one component of a multi-disciplinary discharge planning process, led by the attending physician.  Recommendations may be updated based on patient status, additional functional criteria and insurance authorization.   Follow Up Recommendations  Home health OT    Assistance Recommended at Discharge Frequent or constant Supervision/Assistance  Functional Status Assessment  Patient has had a recent decline in their functional status and/or demonstrates limited ability to make significant improvements in function in a reasonable and predictable amount of time  Equipment Recommendations  None recommended by OT    Recommendations for Other Services       Precautions / Restrictions Precautions Precautions: Fall Precaution Comments: spouse denies h/o falls in past 6 months Restrictions Weight Bearing Restrictions: No      Mobility Bed Mobility Overal bed mobility: Needs Assistance Bed Mobility: Rolling;Sidelying to Sit Rolling: Max assist Sidelying to sit: Max  assist       General bed mobility comments: assist to initiate roll and to raise trunk    Transfers                          Balance Overall balance assessment: Needs assistance Sitting-balance support: Feet supported;No upper extremity supported Sitting balance-Leahy Scale: Good                                     ADL either performed or assessed with clinical judgement   ADL Overall ADL's : At baseline                                       General ADL Comments: patient has caregiver who assists patient with all bathing,dressing and toileting tasks. patient has someone with her for all functional mobiltiy to stear walker. patients husband reported patient is at baseline for those tasks at this time. concerns are with walking. defer walking issue to PT at this time. patients husband was educated on learned helplessness and importance of maintaining participation in tasks. patients husband verbalized and demonstrated how he already does these things at home.     Vision   Additional Comments: unable to assess with patient easily distracted     Perception     Praxis      Pertinent Vitals/Pain Pain Assessment: No/denies pain     Hand Dominance Right   Extremity/Trunk Assessment Upper Extremity Assessment Upper Extremity Assessment: LUE deficits/detail;RUE deficits/detail RUE Deficits / Details: patient was noted to have signs of arthritis in  bilateral hands with more noted on R side. patient's husband reported having HH therapy addressing issue with parafin wax at this time   Lower Extremity Assessment Lower Extremity Assessment: Defer to PT evaluation   Cervical / Trunk Assessment Cervical / Trunk Assessment: Kyphotic   Communication Communication Communication: HOH;Other (comment)   Cognition Arousal/Alertness: Awake/alert Behavior During Therapy: WFL for tasks assessed/performed                                    General Comments: patient is very confused at baseline. has someone with her for all activitie     General Comments       Exercises     Shoulder Instructions      Home Living Family/patient expects to be discharged to:: Private residence Living Arrangements: Spouse/significant other Available Help at Discharge: Family;Personal care attendant;Available 24 hours/day Type of Home: House Home Access: Ramped entrance     Home Layout: One level     Bathroom Shower/Tub: Occupational psychologist: Handicapped height     Home Equipment: Rollator (4 wheels);Shower seat;BSC/3in1   Additional Comments: aide 9-1:00 5x/week, then comes back at night to put her to bed      Prior Functioning/Environment               Mobility Comments: stand by assist for walking ADLs Comments: assist needed, aide assists once per week for shower        OT Problem List:        OT Treatment/Interventions:      OT Goals(Current goals can be found in the care plan section) Acute Rehab OT Goals OT Goal Formulation: Patient unable to participate in goal setting  OT Frequency:     Barriers to D/C:            Co-evaluation              AM-PAC OT "6 Clicks" Daily Activity     Outcome Measure Help from another person eating meals?: A Little Help from another person taking care of personal grooming?: A Lot Help from another person toileting, which includes using toliet, bedpan, or urinal?: A Lot Help from another person bathing (including washing, rinsing, drying)?: A Lot Help from another person to put on and taking off regular upper body clothing?: A Lot Help from another person to put on and taking off regular lower body clothing?: A Lot 6 Click Score: 13   End of Session Nurse Communication: Other (comment) (nurse cleared patient to participate)  Activity Tolerance: Patient limited by fatigue;Patient limited by lethargy Patient left: in bed;with call bell/phone  within reach;with bed alarm set;with family/visitor present  OT Visit Diagnosis: Unsteadiness on feet (R26.81);Other abnormalities of gait and mobility (R26.89);Muscle weakness (generalized) (M62.81);Other symptoms and signs involving cognitive function                Time: 1415-1440 OT Time Calculation (min): 25 min Charges:  OT General Charges $OT Visit: 1 Visit OT Evaluation $OT Eval Low Complexity: 1 Low OT Treatments $Self Care/Home Management : 8-22 mins  Jackelyn Poling OTR/L, MS Acute Rehabilitation Department Office# [phone] Pager# [phone]   [place] 06/09/2021, 3:35 PM

## 2021-06-09 NOTE — Evaluation (Signed)
Physical Therapy Evaluation Patient Details Name: [name] MRN: [id number] DOB: 11-28-1927 Today's Date: 06/09/2021  History of Present Illness  87 y.o. female presented to the ED via EMS for evaluation of confusion, generalized weakness, and poor oral intake. Dx of CAP. Pt with medical history significant of Parkinson's disease, asthma, type 2 diabetes, depression, anxiety, GERD, hypertension, moderate to severe aortic stenosis, non-Hodgkin's lymphoma  Clinical Impression  Pt admitted with above diagnosis. Pt ambulated 39' with RW, and min A to maneuver RW.  At baseline, pt ambulateds with assistance and a rollator at home. I expect she will progress well enough to DC home.  Pt currently with functional limitations due to the deficits listed below (see PT Problem List). Pt will benefit from skilled PT to increase their independence and safety with mobility to allow discharge to the venue listed below.          Recommendations for follow up therapy are one component of a multi-disciplinary discharge planning process, led by the attending physician.  Recommendations may be updated based on patient status, additional functional criteria and insurance authorization.  Follow Up Recommendations Home health PT    Assistance Recommended at Discharge Frequent or constant Supervision/Assistance  Functional Status Assessment Patient has had a recent decline in their functional status and demonstrates the ability to make significant improvements in function in a reasonable and predictable amount of time.  Equipment Recommendations  None recommended by PT    Recommendations for Other Services       Precautions / Restrictions Precautions Precautions: Fall Precaution Comments: spouse denies h/o falls in past 6 months Restrictions Weight Bearing Restrictions: No      Mobility  Bed Mobility Overal bed mobility: Needs Assistance Bed Mobility: Rolling;Sidelying to Sit Rolling: Max  assist Sidelying to sit: Max assist       General bed mobility comments: assist to initiate roll and to raise trunk    Transfers Overall transfer level: Needs assistance Equipment used: Rolling walker (2 wheels) Transfers: Sit to/from Stand Sit to Stand: Mod assist           General transfer comment: VCs hand placement, mod A to power up from bed x 2    Ambulation/Gait Ambulation/Gait assistance: Min assist Gait Distance (Feet): 65 Feet Assistive device: Rolling walker (2 wheels) Gait Pattern/deviations: Step-through pattern;Decreased stride length Gait velocity: decr     General Gait Details: no loss of balance, min A to manage RW  Stairs            Wheelchair Mobility    Modified Rankin (Stroke Patients Only)       Balance Overall balance assessment: Needs assistance Sitting-balance support: Feet supported;No upper extremity supported Sitting balance-Leahy Scale: Good     Standing balance support: Single extremity supported;Reliant on assistive device for balance Standing balance-Leahy Scale: Poor                               Pertinent Vitals/Pain Pain Assessment: No/denies pain    Home Living Family/patient expects to be discharged to:: Private residence Living Arrangements: Spouse/significant other Available Help at Discharge: Family;Personal care attendant;Available 24 hours/day Type of Home: House Home Access: Ramped entrance       Home Layout: One level Home Equipment: Rollator (4 wheels);Shower seat;BSC/3in1 Additional Comments: aide 9-1:00 5x/week, then comes back at night to put her to bed    Prior Function  Mobility Comments: stand by assist for walking ADLs Comments: assist needed, aide assists once per week for shower     Hand Dominance        Extremity/Trunk Assessment   Upper Extremity Assessment Upper Extremity Assessment: Generalized weakness;Difficult to assess due to impaired  cognition    Lower Extremity Assessment Lower Extremity Assessment: Generalized weakness;Difficult to assess due to impaired cognition    Cervical / Trunk Assessment Cervical / Trunk Assessment: Kyphotic  Communication   Communication: HOH;Other (comment) (has cochlear implant on R, hearing aide on L)  Cognition Arousal/Alertness: Awake/alert Behavior During Therapy: WFL for tasks assessed/performed Overall Cognitive Status: History of cognitive impairments - at baseline                                 General Comments: per husband, pt is confused at baseline, she was not able to follow 1 step commands but it seemed she also wasn't hearing very well. She has cochlear implant on R, and wears hearing aide on L which wasn't in during time of eval.        General Comments      Exercises     Assessment/Plan    PT Assessment Patient needs continued PT services  PT Problem List Decreased activity tolerance;Decreased mobility       PT Treatment Interventions Gait training;Functional mobility training;Therapeutic exercise;Patient/family education;Therapeutic activities    PT Goals (Current goals can be found in the Care Plan section)  Acute Rehab PT Goals Patient Stated Goal: to get stronger PT Goal Formulation: With family Time For Goal Achievement: 06/23/21 Potential to Achieve Goals: Good    Frequency Min 3X/week   Barriers to discharge        Co-evaluation               AM-PAC PT "6 Clicks" Mobility  Outcome Measure Help needed turning from your back to your side while in a flat bed without using bedrails?: A Lot Help needed moving from lying on your back to sitting on the side of a flat bed without using bedrails?: A Lot Help needed moving to and from a bed to a chair (including a wheelchair)?: A Lot Help needed standing up from a chair using your arms (e.g., wheelchair or bedside chair)?: A Lot Help needed to walk in hospital room?: A  Little Help needed climbing 3-5 steps with a railing? : A Lot 6 Click Score: 13    End of Session Equipment Utilized During Treatment: Gait belt Activity Tolerance: Patient tolerated treatment well;No increased pain Patient left: in chair;with call bell/phone within reach;with family/visitor present;with nursing/sitter in room;with chair alarm set Nurse Communication: Mobility status;Other (comment) (bed saturated in urine upon my arrival, pt on a purewick) PT Visit Diagnosis: Difficulty in walking, not elsewhere classified (R26.2)    Time: 1308-6578 PT Time Calculation (min) (ACUTE ONLY): 33 min   Charges:   PT Evaluation $PT Eval Moderate Complexity: 1 Mod PT Treatments $Gait Training: 8-22 mins       Blondell Reveal Kistler PT 06/09/2021  Acute Rehabilitation Services Pager [phone] Office [phone]

## 2021-06-09 NOTE — Progress Notes (Signed)
Initial Nutrition Assessment  DOCUMENTATION CODES:   Severe malnutrition in context of chronic illness, Underweight  INTERVENTION:   -Ensure Enlive po TID, each supplement provides 350 kcal and 20 grams of protein  -Multivitamin with minerals daily  NUTRITION DIAGNOSIS:   Severe Malnutrition related to chronic illness as evidenced by severe fat depletion, severe muscle depletion.  GOAL:   Patient will meet greater than or equal to 90% of their needs  MONITOR:   PO intake, Supplement acceptance, Labs, Weight trends, I & O's, Skin  REASON FOR ASSESSMENT:   Malnutrition Screening Tool    ASSESSMENT:   87 year old female with history of Parkinson's disease, asthma, diabetes type 2, depression, anxiety, GERD, hypertension, moderate to severe aortic stenosis, non-Hodgkin's lymphoma in remission who presented with confusion, generalized weakness, poor oral intake from home.  On presentation she was hypertensive.  UA was without any signs of infection.  COVID/influenza PCR negative.  Chest imaging showed severe bronchitis, developing multi lobar bronchopneumonia most severe on the left lower lobe.  Aspiration pneumonia was also suspected, speech therapy consulted.  Patient in room, sitting in chair with husband at bedside. Pt getting set up to start having lunch meal. Pt is currently alert/oriented x 1.  Per husband, pt ate some eggs, bacon and sausage but not a lot this morning. Pt consumed 20% of dinner last night.  At home she eats a little better as she has a caregiver that cooks meals for her. States her pneumonia and difficulty swallowing has been impacting her nutrition and she is progressively weaker.  Pt drinks protein shakes at home but pt's husband could not think of brand.  Will order Ensure supplements for additional kcals and protein.  Per weight records, pt has lost 19 lbs since 07/15/20 (16% wt loss x 11 months, insignificant for time frame).   Medications  reviewed.  Labs reviewed.  NUTRITION - FOCUSED PHYSICAL EXAM:  Flowsheet Row Most Recent Value  Orbital Region Severe depletion  Upper Arm Region Severe depletion  Thoracic and Lumbar Region Severe depletion  Buccal Region Severe depletion  Temple Region Severe depletion  Clavicle Bone Region Severe depletion  Clavicle and Acromion Bone Region Severe depletion  Scapular Bone Region Severe depletion  Dorsal Hand Severe depletion  Patellar Region Unable to assess  Anterior Thigh Region Unable to assess  Posterior Calf Region Severe depletion  Edema (RD Assessment) None  Hair Reviewed  Eyes Reviewed  Mouth Reviewed  Skin Reviewed       Diet Order:   Diet Order             DIET DYS 3 Room service appropriate? Yes; Fluid consistency: Nectar Thick  Diet effective now                   EDUCATION NEEDS:   No education needs have been identified at this time  Skin:  Skin Assessment: Reviewed RN Assessment  Last BM:  11/11  Height:   Ht Readings from Last 1 Encounters:  06/08/21 5\' 3"  (1.6 m)    Weight:   Wt Readings from Last 1 Encounters:  06/07/21 44.7 kg    BMI:  Body mass index is 17.47 kg/m.  Estimated Nutritional Needs:   Kcal:  1500-1700  Protein:  70-85g  Fluid:  1.5L/day  Clayton Bibles, MS, RD, LDN Inpatient Clinical Dietitian Contact information available via Amion

## 2021-06-09 NOTE — Progress Notes (Signed)
  SLP Cancellation Note  Patient Details Name: [name] MRN: [id number] DOB: Jan 31, 1928   Cancelled treatment:       Reason Eval/Treat Not Completed: Patient's level of consciousness. SLP attempted to see late AM but she was sleeping and unable to be aroused. Spouse who was in the room said she was tired out from some recent activity. SLP will attempt to f/u next date for dysphagia tx.    Sonia Baller, MA, CCC-SLP Speech Therapy

## 2021-06-10 DIAGNOSIS — J189 Pneumonia, unspecified organism: Secondary | ICD-10-CM | POA: Diagnosis not present

## 2021-06-10 DIAGNOSIS — E43 Unspecified severe protein-calorie malnutrition: Secondary | ICD-10-CM | POA: Insufficient documentation

## 2021-06-10 MED ORDER — DOXYCYCLINE HYCLATE 100 MG PO TABS
100.0000 mg | ORAL_TABLET | Freq: Two times a day (BID) | ORAL | Status: DC
  Administered 2021-06-10 – 2021-06-11 (×2): 100 mg via ORAL
  Filled 2021-06-10 (×2): qty 1

## 2021-06-10 MED ORDER — CEFDINIR 300 MG PO CAPS
300.0000 mg | ORAL_CAPSULE | Freq: Two times a day (BID) | ORAL | Status: DC
  Administered 2021-06-11: 300 mg via ORAL
  Filled 2021-06-10 (×2): qty 1

## 2021-06-10 MED ORDER — CARBIDOPA-LEVODOPA 25-100 MG PO TABS
1.0000 | ORAL_TABLET | Freq: Two times a day (BID) | ORAL | Status: DC
  Administered 2021-06-10 – 2021-06-11 (×3): 1 via ORAL
  Filled 2021-06-10 (×3): qty 1

## 2021-06-10 MED ORDER — CARBIDOPA-LEVODOPA 25-100 MG PO TABS
2.0000 | ORAL_TABLET | Freq: Every day | ORAL | Status: DC
  Administered 2021-06-10: 2 via ORAL
  Filled 2021-06-10: qty 2

## 2021-06-10 NOTE — Progress Notes (Addendum)
Physical Therapy Treatment Patient Details Name: [name] MRN: [id number] DOB: Feb 20, 1928 Today's Date: 06/10/2021   History of Present Illness 87 y.o. female presented to the ED via EMS for evaluation of confusion, generalized weakness, and poor oral intake. Dx of CAP. Pt with medical history significant of Parkinson's disease, asthma, type 2 diabetes, depression, anxiety, GERD, hypertension, moderate to severe aortic stenosis, non-Hodgkin's lymphoma    PT Comments    Husband present (in/out of session). Both pt and husband agreeable to PT session. Pt continues to require significant assistance for mobility. She follows 1 step commands inconsistently-requires multimodal cueing. She was able to ambulate in the hallway with a RW. Pt immediately returned to sleeping once back in bed. Discussed d/c plan with husband-he prefers for her to return home with HHPT f/u. He currently doesn't feel she is ready to be discharged home. Encouraged him to discuss d/c with MD and TOC team as able.     Recommendations for follow up therapy are one component of a multi-disciplinary discharge planning process, led by the attending physician.  Recommendations may be updated based on patient status, additional functional criteria and insurance authorization.  Follow Up Recommendations  Home health PT (husband prefers home. he states he has aide assistance 9-1pm + nightly visit)     Assistance Recommended at Discharge Frequent or constant Supervision/Assistance  Equipment Recommendations  None recommended by PT    Recommendations for Other Services       Precautions / Restrictions Precautions Precautions: Fall Precaution Comments: spouse denies h/o falls in past 6 months Restrictions Weight Bearing Restrictions: No     Mobility  Bed Mobility Overal bed mobility: Needs Assistance Bed Mobility: Supine to Sit;Sit to Supine     Supine to sit: Mod assist Sit to supine: Max assist   General  bed mobility comments: Assist for trunk and bil LEs. Pt initiated for supine>sit but not for sit>supine. Poor processing.    Transfers Overall transfer level: Needs assistance Equipment used: Rolling walker (2 wheels) Transfers: Sit to/from Stand Sit to Stand: Mod assist           General transfer comment: Assist to power up, stabilize, control descent. Multimodal cueing required.    Ambulation/Gait Ambulation/Gait assistance: Min assist Gait Distance (Feet): 75 Feet Assistive device: Rolling walker (2 wheels) Gait Pattern/deviations: Step-through pattern;Trunk flexed;Narrow base of support;Decreased stride length       General Gait Details: Assist to stabilize pt and maneuver RW throughout distance. Multimodal cueing required.   Stairs             Wheelchair Mobility    Modified Rankin (Stroke Patients Only)       Balance Overall balance assessment: Needs assistance         Standing balance support: Bilateral upper extremity supported;Reliant on assistive device for balance Standing balance-Leahy Scale: Poor                              Cognition   Behavior During Therapy: WFL for tasks assessed/performed Overall Cognitive Status: History of cognitive impairments - at baseline Area of Impairment: Orientation;Attention;Memory;Safety/judgement;Problem solving                 Orientation Level: Disoriented to;Place;Time;Situation   Memory: Decreased short-term memory   Safety/Judgement: Decreased awareness of safety;Decreased awareness of deficits   Problem Solving: Slow processing;Decreased initiation;Difficulty sequencing;Requires verbal cues;Requires tactile cues General Comments: has confusion at baseline however husband feels it  is a bit worse currently        Exercises      General Comments        Pertinent Vitals/Pain Pain Assessment: Faces Faces Pain Scale: No hurt    Home Living                           Prior Function            PT Goals (current goals can now be found in the care plan section) Progress towards PT goals: Progressing toward goals    Frequency    Min 3X/week      PT Plan Current plan remains appropriate    Co-evaluation              AM-PAC PT "6 Clicks" Mobility   Outcome Measure  Help needed turning from your back to your side while in a flat bed without using bedrails?: A Lot Help needed moving from lying on your back to sitting on the side of a flat bed without using bedrails?: A Lot Help needed moving to and from a bed to a chair (including a wheelchair)?: A Lot Help needed standing up from a chair using your arms (e.g., wheelchair or bedside chair)?: A Lot Help needed to walk in hospital room?: A Lot Help needed climbing 3-5 steps with a railing? : A Lot 6 Click Score: 12    End of Session Equipment Utilized During Treatment: Gait belt Activity Tolerance: Patient tolerated treatment well Patient left: in bed;with call bell/phone within reach;with family/visitor present;with bed alarm set   PT Visit Diagnosis: Muscle weakness (generalized) (M62.81);Difficulty in walking, not elsewhere classified (R26.2)     Time: 9833-8250 PT Time Calculation (min) (ACUTE ONLY): 31 min  Charges:  $Gait Training: 23-37 mins                     Doreatha Massed, PT Acute Rehabilitation  Office: [phone] Pager: [phone]

## 2021-06-10 NOTE — Progress Notes (Signed)
PROGRESS NOTE    [name]  ALP:[id number] DOB: 03-16-1928 DOA: 06/07/2021 PCP: [name], MD   Chief Complain: Altered mental status  Brief Narrative: Patient is a 87 year old female with history of Parkinson's disease, asthma, diabetes type 2, depression, anxiety, GERD, hypertension, moderate to severe aortic stenosis, non-Hodgkin's lymphoma in remission who presented with confusion, generalized weakness, poor oral intake from home.  On presentation she was hypertensive.  UA was without any signs of infection.  COVID/influenza PCR negative.  Chest imaging showed severe bronchitis, developing multi lobar bronchopneumonia most severe on the left lower lobe.  Aspiration pneumonia was also suspected, speech therapy consulted.  She was on IV antibiotics for pneumonia.  Plan is to discharge her tomorrow to home with home health.  Assessment & Plan:   Principal Problem:   CAP (community acquired pneumonia) Active Problems:   Parkinson's disease ([place])   Hypertensive urgency   Acute metabolic encephalopathy   Generalized weakness   FTT (failure to thrive) in adult   Protein-calorie malnutrition, severe   Community-acquired pneumonia/possible aspiration pneumonia: Presented with weakness, confusion.  Chest imaging was concerning for severe bronchitis and developing multilobar bronchopneumonia, most severe in the left lower lobe.  No fever or leukocytosis.  She was not hypoxic on presentation.  COVID/influenza patient negative.  Started on ceftriaxone and doxycycline , antibiotics will be changed to oral.   Continue bronchodilators as needed.  Currently she is on room air.  Dysphagia: Speech therapy evaluated the patient recommended dysphagia 3 diet.  Acute metabolic encephalopathy: Secondary to pneumonia.  CT head negative for acute findings.  No focal weakness.  No meningeal signs.  Continue IV antibiotics for pneumonia.  Continue to monitor mental status  Severe  hypertension: Blood pressure constantly elevated.  Not on any antihypertensives at home.  She was previously taking amlodipine and lisinopril which were discontinued to orthostatic hypotension.  Continue PRN medications for severe hypertension.  Started on amlodipine 10 mg daily  Parkinson's disease: On Sinemet  Anxiety/depression: On Zoloft  Mild intermittent asthma: Not on any home inhalers.  She was not wheezing on presentation.  Continue bronchodilators as needed  Diet controlled type 2 diabetes: Recent hemoglobin A1c of 5.2.  Continue monitor blood sugars  History of herpes zoster: On prophylactic acyclovir.  Physical deconditioning: PT/OT evaluation done, recommended home health upon discharge     Nutrition Problem: Severe Malnutrition Etiology: chronic illness      DVT prophylaxis:Lovenox Code Status: DNR Family Communication: Husband at bedside on 06/09/21.  Called again on phone today, call not received Status is: Inpatient  Remains inpatient appropriate because: Elderly patient with multilobar pneumonia requiring IV antibiotics     Consultants: None  Procedures: None  Antimicrobials:  Anti-infectives (From admission, onward)    Start     Dose/Rate Route Frequency Ordered Stop   06/08/21 1000  acyclovir (ZOVIRAX) tablet 400 mg        400 mg Oral 2 times daily 06/08/21 0522     06/08/21 0900  doxycycline (VIBRAMYCIN) 100 mg in sodium chloride 0.9 % 250 mL IVPB        100 mg 125 mL/hr over 120 Minutes Intravenous Every 12 hours 06/08/21 0715     06/08/21 0800  cefTRIAXone (ROCEPHIN) 1 g in sodium chloride 0.9 % 100 mL IVPB        1 g 200 mL/hr over 30 Minutes Intravenous Every 24 hours 06/08/21 0714         Subjective:  Patient seen and examined the  bedside this morning.  Hemodynamically stable.  On room air.  She was being fed by an Environmental consultant.  She looked overall comfortable, not in distress, on room air. We had discussed the discharge planning for  today with her husband yesterday at bedside.  He requested [place] to make her stay 1 more night so that he can prepare to take her home, so the discharge planning will be tomorrow.  Objective: Vitals:   06/09/21 0637 06/09/21 1752 06/09/21 2141 06/10/21 0634  BP: (!) 187/106 115/72 (!) 141/77 (!) 150/87  Pulse: 83 81 81 84  Resp:  16 16 14   Temp: 97.6 F (36.4 C) 98.4 F (36.9 C) (!) 97.5 F (36.4 C) 98.2 F (36.8 C)  TempSrc: Oral Oral Oral Oral  SpO2: 96% 97% 97% 96%  Weight:      Height:        Intake/Output Summary (Last 24 hours) at 06/10/2021 0806 Last data filed at 06/10/2021 0300 Gross per 24 hour  Intake 1152.12 ml  Output 250 ml  Net 902.12 ml   Filed Weights   06/07/21 2131  Weight: 44.7 kg    Examination:  General exam: Overall comfortable, not in distress, pleasantly confused, frail, debilitated HEENT: PERRL Respiratory system:  diminished air sounds bilaterally,no wheezes or crackles  Cardiovascular system: S1 & S2 heard, RRR.  Gastrointestinal system: Abdomen is nondistended, soft and nontender. Central nervous system: Alert and oriented Extremities: No edema, no clubbing ,no cyanosis Skin: No rashes, no ulcers,no icterus     Data Reviewed: I have personally reviewed following labs and imaging studies  CBC: Recent Labs  Lab 06/07/21 2138  WBC 5.0  HGB 12.5  HCT 37.6  MCV 87.4  PLT 703*   Basic Metabolic Panel: Recent Labs  Lab 06/07/21 2138 06/09/21 0515  NA 137 137  K 4.4 3.5  CL 105 103  CO2 27 23  GLUCOSE 104* 74  BUN 19 12  CREATININE 0.94 0.62  CALCIUM 9.0 8.5*   GFR: Estimated Creatinine Clearance: 32.3 mL/min (by C-G formula based on SCr of 0.62 mg/dL). Liver Function Tests: Recent Labs  Lab 06/07/21 2138  AST 19  ALT 12  ALKPHOS 86  BILITOT 0.8  PROT 7.1  ALBUMIN 3.8   No results for input(s): LIPASE, AMYLASE in the last 168 hours. Recent Labs  Lab 06/08/21 0615  AMMONIA 19   Coagulation Profile: No results  for input(s): INR, PROTIME in the last 168 hours. Cardiac Enzymes: No results for input(s): CKTOTAL, CKMB, CKMBINDEX, TROPONINI in the last 168 hours. BNP (last 3 results) No results for input(s): PROBNP in the last 8760 hours. HbA1C: Recent Labs    06/08/21 0615  HGBA1C 4.9   CBG: No results for input(s): GLUCAP in the last 168 hours. Lipid Profile: No results for input(s): CHOL, HDL, LDLCALC, TRIG, CHOLHDL, LDLDIRECT in the last 72 hours. Thyroid Function Tests: Recent Labs    06/08/21 0615  TSH 3.325   Anemia Panel: Recent Labs    06/08/21 0615  VITAMINB12 394   Sepsis Labs: Recent Labs  Lab 06/07/21 2138  LATICACIDVEN 1.1    Recent Results (from the past 240 hour(s))  Urine Culture     Status: None (Preliminary result)   Collection Time: 06/07/21  9:40 PM   Specimen: In/Out Cath Urine  Result Value Ref Range Status   Specimen Description   Final    IN/OUT CATH URINE Performed at [facility] [address], [place], [place] [address]  Special Requests   Final    NONE Performed at [facility], [place] [address], [place], [place] [address]    Culture   Final    IDENTIFICATION TO FOLLOW Performed at [facility], [place] [address] [place], [place] [address]    Report Status PENDING  Incomplete  Resp Panel by RT-PCR (Flu A&B, Covid) Nasopharyngeal Swab     Status: None   Collection Time: 06/07/21 10:06 PM   Specimen: Nasopharyngeal Swab; Nasopharyngeal(NP) swabs in vial transport medium  Result Value Ref Range Status   SARS Coronavirus 2 by RT PCR NEGATIVE NEGATIVE Final    Comment: (NOTE) SARS-CoV-2 target nucleic acids are NOT DETECTED.  The SARS-CoV-2 RNA is generally detectable in upper respiratory specimens during the acute phase of infection. The lowest concentration of SARS-CoV-2 viral copies this assay can detect is 138 copies/mL. A negative result does not preclude SARS-Cov-2 infection and should not  be used as the sole basis for treatment or other patient management decisions. A negative result may occur with  improper specimen collection/handling, submission of specimen other than nasopharyngeal swab, presence of viral mutation(s) within the areas targeted by this assay, and inadequate number of viral copies(<138 copies/mL). A negative result must be combined with clinical observations, patient history, and epidemiological information. The expected result is Negative.  Fact Sheet for Patients:  EntrepreneurPulse.com.au  Fact Sheet for Healthcare Providers:  IncredibleEmployment.be  This test is no t yet approved or cleared by the [place] FDA and  has been authorized for detection and/or diagnosis of SARS-CoV-2 by FDA under an Emergency Use Authorization (EUA). This EUA will remain  in effect (meaning this test can be used) for the duration of the COVID-19 declaration under Section 564(b)(1) of the Act, 21 U.S.C.section 360bbb-3(b)(1), unless the authorization is terminated  or revoked sooner.       Influenza A by PCR NEGATIVE NEGATIVE Final   Influenza B by PCR NEGATIVE NEGATIVE Final    Comment: (NOTE) The Xpert Xpress SARS-CoV-2/FLU/RSV plus assay is intended as an aid in the diagnosis of influenza from Nasopharyngeal swab specimens and should not be used as a sole basis for treatment. Nasal washings and aspirates are unacceptable for Xpert Xpress SARS-CoV-2/FLU/RSV testing.  Fact Sheet for Patients: EntrepreneurPulse.com.au  Fact Sheet for Healthcare Providers: IncredibleEmployment.be  This test is not yet approved or cleared by the [place] FDA and has been authorized for detection and/or diagnosis of SARS-CoV-2 by FDA under an Emergency Use Authorization (EUA). This EUA will remain in effect (meaning this test can be used) for the duration of the COVID-19 declaration under Section  564(b)(1) of the Act, 21 U.S.C. section 360bbb-3(b)(1), unless the authorization is terminated or revoked.  Performed at [facility], [place] [address] [place], [place] [address]   Culture, blood (routine x 2)     Status: None (Preliminary result)   Collection Time: 06/08/21  7:58 AM   Specimen: BLOOD  Result Value Ref Range Status   Specimen Description   Final    BLOOD RIGHT ANTECUBITAL Performed at [facility] [address] [place], [place] [address]    Special Requests   Final    BOTTLES DRAWN AEROBIC ONLY Blood Culture adequate volume Performed at [facility] [address] [place], [place] [address]    Culture   Final    NO GROWTH < 12 HOURS Performed at [facility] [address] [place], [place] [address]  Report Status PENDING  Incomplete  Culture, blood (routine x 2)     Status: None (Preliminary result)   Collection Time: 06/08/21  7:58 AM   Specimen: BLOOD  Result Value Ref Range Status   Specimen Description   Final    BLOOD BLOOD RIGHT HAND Performed at [facility] [address], [place], [place] [address]    Special Requests   Final    BOTTLES DRAWN AEROBIC ONLY Blood Culture adequate volume Performed at [facility] [address] [place], [place] [address]    Culture   Final    NO GROWTH < 12 HOURS Performed at [facility] [address], [place], [place] [address]    Report Status PENDING  Incomplete         Radiology Studies: No results found.      Scheduled Meds:  acyclovir  400 mg Oral BID   amLODipine  10 mg Oral Daily   carbidopa-levodopa  1 tablet Oral BID PC   carbidopa-levodopa  2 tablet Oral QPC supper   enoxaparin (LOVENOX) injection  30 mg Subcutaneous Q24H   feeding supplement  237 mL Oral TID BM   multivitamin with minerals  1 tablet Oral Daily   sertraline  100 mg Oral Daily   Continuous  Infusions:  cefTRIAXone (ROCEPHIN)  IV 1 g (06/10/21 0732)   doxycycline (VIBRAMYCIN) IV Stopped (06/09/21 2328)     LOS: 2 days    Time spent: 25 mins.More than 50% of that time was spent in counseling and/or coordination of care.      Shelly Coss, MD [facility] P11/15/2022, 8:06 AM

## 2021-06-11 DIAGNOSIS — J189 Pneumonia, unspecified organism: Secondary | ICD-10-CM | POA: Diagnosis not present

## 2021-06-11 LAB — URINE CULTURE: Culture: >=100000 — AB

## 2021-06-11 MED ORDER — CEFDINIR 300 MG PO CAPS: 300.0000 mg | ORAL_CAPSULE | Freq: Two times a day (BID) | ORAL | 0 refills | Status: AC

## 2021-06-11 MED ORDER — FOSFOMYCIN TROMETHAMINE 3 G PO PACK
3.0000 g | PACK | Freq: Once | ORAL | Status: AC
  Administered 2021-06-11: 3 g via ORAL
  Filled 2021-06-11: qty 3

## 2021-06-11 MED ORDER — AMLODIPINE BESYLATE 10 MG PO TABS: 10.0000 mg | ORAL_TABLET | Freq: Every day | ORAL | 1 refills | Status: DC

## 2021-06-11 MED ORDER — DOXYCYCLINE HYCLATE 100 MG PO TABS: 100.0000 mg | ORAL_TABLET | Freq: Two times a day (BID) | ORAL | 0 refills | Status: AC

## 2021-06-11 NOTE — Progress Notes (Signed)
Speech Language Pathology Treatment: Dysphagia  Patient Details Name: [name] MRN: [id number] DOB: 1928/02/03 Today's Date: 06/11/2021 Time: 1000-1030 SLP Time Calculation (min) (ACUTE ONLY): 30 min  Assessment / Plan / Recommendation Clinical Impression  Pt alert, communicative.  Assisted with breakfast - she fed herself dysphagia 3 food items with occasional assistance; drank from the edge of a cup and straw independently.  Assessed with thin liquids, nectar, and honey-thick liquids, ALL OF WHICH elicited intermittent throat-clearing. [name] has been evaluated for swallowing during prior admissions - overall function fluctuates and is often dependent on medical condition, c/w dementia, parkinson's disease. Currently, BS are diminished but improved.  Given no real clinical difference in response with the variety of liquid consistencies, recommend allowing thin liquids to support hydration.  Continue meds whole or crushed in puree.    No SLP f/u is needed. Called pt's husband, [name], at 10:44, but there was no answer.      HPI HPI: 87 y.o. female with medical history significant of Parkinson's disease, asthma, type 2 diabetes, depression, anxiety, GERD, hypertension, moderate to severe aortic stenosis, non-Hodgkin's lymphoma in remission presented to the ED via EMS for evaluation of confusion, generalized weakness, and poor oral intake.  Family reported baseline weakness and confusion related to Parkinson's disease but confusion has increased over the last 36 hours.  No fever, cough, or vomiting reported.  Normally uses a walker to ambulate but was not able to get up from the couch yesterday and family had to call EMS.      SLP Plan  Discharge SLP treatment due to (comment) (pt D/Cing home today)      Recommendations for follow up therapy are one component of a multi-disciplinary discharge planning process, led by the attending physician.  Recommendations may be  updated based on patient status, additional functional criteria and insurance authorization.    Recommendations  Diet recommendations: Dysphagia 3 (mechanical soft);Thin liquid Liquids provided via: Cup;Straw Medication Administration: Crushed with puree Supervision: Staff to assist with self feeding Compensations: Minimize environmental distractions                Oral Care Recommendations: Oral care BID Follow Up Recommendations: No SLP follow up SLP Visit Diagnosis: Dysphagia, oropharyngeal phase (R13.12) Plan: Discharge SLP treatment due to (comment) (pt D/Cing home today)       GO              [name] L. [name], [place] CCC/SLP Acute Rehabilitation Services Office number [phone] Pager [phone]   [name]  06/11/2021, 10:35 AM

## 2021-06-11 NOTE — Discharge Summary (Signed)
Physician Discharge Summary  [name] ZOX:[id number] DOB: Oct 11, 1927 DOA: 06/07/2021  PCP: [name], MD  Admit date: 06/07/2021 Discharge date: 06/11/2021  Admitted From: Home Disposition:  Home  Discharge Condition:Stable CODE STATUS:DNR Diet recommendation:  Dysphagia 3  Brief/Interim Summary:  Patient is a 87 year old female with history of Parkinson's disease, asthma, diabetes type 2, depression, anxiety, GERD, hypertension, moderate to severe aortic stenosis, non-Hodgkin's lymphoma in remission who presented with confusion, generalized weakness, poor oral intake from home.  On presentation she was hypertensive.  UA was without any signs of infection.  COVID/influenza PCR negative.  Chest imaging showed severe bronchitis, developing multi lobar bronchopneumonia most severe on the left lower lobe.  Aspiration pneumonia was also suspected, speech therapy consulted.  She was on IV antibiotics for pneumonia.  Her respiratory status is stable, currently on room air.  Plan is to discharge her today to home with home health and oral antibiotics.  Following problems were addressed during her hospitalization:   Community-acquired pneumonia/possible aspiration pneumonia: Presented with weakness, confusion.  Chest imaging was concerning for severe bronchitis and developing multilobar bronchopneumonia, most severe in the left lower lobe.  No fever or leukocytosis.  She was not hypoxic on presentation.  COVID/influenza patient negative.  Started on ceftriaxone and doxycycline , antibiotics will be changed to oral.   Currently she is on room air.   Dysphagia: Speech therapy evaluated the patient recommended dysphagia 3 diet.   Acute metabolic encephalopathy: Secondary to pneumonia.  CT head negative for acute findings.  No focal weakness.  No meningeal signs.  Mental status currently at baseline.  Severe hypertension: Blood pressure constantly elevated.  Not on any  antihypertensives at home.  She was previously taking amlodipine and lisinopril which were discontinued to orthostatic hypotension.  Started on amlodipine 10 mg daily   Parkinson's disease: On Sinemet   Anxiety/depression: On Zoloft   Mild intermittent asthma: Not on any home inhalers.  She was not wheezing on presentation.    Diet controlled type 2 diabetes: Recent hemoglobin A1c of 5.2.  Currently diet controlled  History of herpes zoster: On prophylactic acyclovir.   Physical deconditioning: PT/OT evaluation done, recommended home health upon discharge  Discharge Diagnoses:  Principal Problem:   CAP (community acquired pneumonia) Active Problems:   Parkinson's disease ([place])   Hypertensive urgency   Acute metabolic encephalopathy   Generalized weakness   FTT (failure to thrive) in adult   Protein-calorie malnutrition, severe    Discharge Instructions  Discharge Instructions     Diet general   Complete by: As directed    Dysphagia 3 diet   Discharge instructions   Complete by: As directed    1)Please follow-up with your PCP in a week. 2)Monitor your blood pressure at home 3)Take prescribed medications as instructed.   Increase activity slowly   Complete by: As directed    No wound care   Complete by: As directed       Allergies as of 06/11/2021       Reactions   Azithromycin Other (See Comments)   confusion   Tramadol Other (See Comments)   Hallucinations        Medication List     TAKE these medications    acetaminophen 325 MG tablet Commonly known as: TYLENOL Take 650 mg by mouth every 6 (six) hours as needed for mild pain, moderate pain, fever or headache.   acyclovir 400 MG tablet Commonly known as: ZOVIRAX TAKE 1 TABLET BY MOUTH TWICE DAILY  amLODipine 10 MG tablet Commonly known as: NORVASC Take 1 tablet (10 mg total) by mouth daily. Start taking on: June 12, 2021   CALCIUM-CARB 600 PO Take 600 mg by mouth daily.    carbidopa-levodopa 25-100 MG tablet Commonly known as: SINEMET IR TAKE 1 TABLET BY MOUTH DAILY EVERY MORNING, 1 IN THE AFTERNOON, AND 2 IN THE EVENING What changed: See the new instructions.   cefdinir 300 MG capsule Commonly known as: OMNICEF Take 1 capsule (300 mg total) by mouth every 12 (twelve) hours for 2 days.   cholecalciferol 1000 units tablet Commonly known as: VITAMIN D Take 1,000 Units by mouth every evening.   docusate sodium 100 MG capsule Commonly known as: COLACE Take 100 mg by mouth daily as needed for mild constipation.   doxycycline 100 MG tablet Commonly known as: VIBRA-TABS Take 1 tablet (100 mg total) by mouth every 12 (twelve) hours for 2 days.   sertraline 100 MG tablet Commonly known as: ZOLOFT Take 1 tablet (100 mg total) by mouth daily.        Follow-up Information     [name], MD. Schedule an appointment as soon as possible for a visit in 1 week(s).   Specialty: Family Medicine Contact information: [place] [address] [name] [address] [phone]                Allergies  Allergen Reactions   Azithromycin Other (See Comments)    confusion   Tramadol Other (See Comments)    Hallucinations    Consultations: None   Procedures/Studies: CT HEAD WO CONTRAST (5MM)  Result Date: 06/08/2021 CLINICAL DATA:  Delirium and hallucination EXAM: CT HEAD WITHOUT CONTRAST TECHNIQUE: Contiguous axial images were obtained from the base of the skull through the vertex without intravenous contrast. COMPARISON:  11/11/2020 FINDINGS: Brain: No evidence of acute infarction, hemorrhage, hydrocephalus, extra-axial collection or mass lesion/mass effect. Atrophy and extensive chronic small vessel ischemia. Vascular: No hyperdense vessel or unexpected calcification. Skull: Mastoidectomy on the right with clear bulb. Sinuses/Orbits: Negative for acute finding. Bilateral cataract resection. IMPRESSION: 1. No acute or reversible  finding. 2. Atrophy and chronic small vessel disease. Electronically Signed   By: Jorje Guild M.D.   On: 06/08/2021 06:51   DG CHEST PORT 1 VIEW  Result Date: 06/08/2021 CLINICAL DATA:  87 year old female with history of altered mental status. EXAM: PORTABLE CHEST 1 VIEW COMPARISON:  Chest x-ray 11/11/2020. FINDINGS: Lung volumes are low. Opacity at the left base may reflect atelectasis and/or consolidation. Small left and trace right pleural effusions. Diffuse interstitial prominence and widespread peribronchial cuffing. Ill-defined opacities are also noted in the region of the left upper lobe. No pneumothorax. No evidence of pulmonary edema. Heart size is upper limits of normal. The patient is rotated to the left on today's exam, resulting in distortion of the mediastinal contours and reduced diagnostic sensitivity and specificity for mediastinal pathology. Atherosclerotic calcifications in the thoracic aorta. Surgical clips along the chest wall and in the axillary regions bilaterally. IMPRESSION: 1. The appearance the chest is concerning for severe bronchitis and developing multilobar bronchopneumonia, most severe in the left lower lobe. 2. Small left and trace right pleural effusions. 3. Aortic atherosclerosis. Electronically Signed   By: Vinnie Langton M.D.   On: 06/08/2021 06:50      Subjective: Patient seen and examined the bedside this morning.  Hemodynamically stable for discharge to home today.  I called the husband several times since yesterday, call not received  Discharge Exam: Vitals:   06/10/21 2234 06/11/21 0544  BP: (!) 143/86 (!) 163/92  Pulse: 85 78  Resp: 14 14  Temp: 97.7 F (36.5 C) 97.6 F (36.4 C)  SpO2: 96% 98%   Vitals:   06/10/21 0634 06/10/21 1823 06/10/21 2234 06/11/21 0544  BP: (!) 150/87 130/70 (!) 143/86 (!) 163/92  Pulse: 84 80 85 78  Resp: 14 16 14 14   Temp: 98.2 F (36.8 C) 98.4 F (36.9 C) 97.7 F (36.5 C) 97.6 F (36.4 C)  TempSrc: Oral Oral  Oral Oral  SpO2: 96% 98% 96% 98%  Weight:      Height:        General: Pt is alert, awake, not in acute distress Cardiovascular: RRR, S1/S2 +, no rubs, no gallops Respiratory: CTA bilaterally, no wheezing, no rhonchi Abdominal: Soft, NT, ND, bowel sounds + Extremities: no edema, no cyanosis    The results of significant diagnostics from this hospitalization (including imaging, microbiology, ancillary and laboratory) are listed below for reference.     Microbiology: Recent Results (from the past 240 hour(s))  Urine Culture     Status: Abnormal   Collection Time: 06/07/21  9:40 PM   Specimen: In/Out Cath Urine  Result Value Ref Range Status   Specimen Description   Final    IN/OUT CATH URINE Performed at [facility] [address] [place], [place] [address]    Special Requests   Final    NONE Performed at [facility], [place] [address], [place], [place] [address]    Culture (A)  Final    >=100,000 COLONIES/mL AEROCOCCUS URINAE Standardized susceptibility testing for this organism is not available. Performed at [facility], [place] [address], [place], [place] [address]    Report Status 06/11/2021 FINAL  Final  Resp Panel by RT-PCR (Flu A&B, Covid) Nasopharyngeal Swab     Status: None   Collection Time: 06/07/21 10:06 PM   Specimen: Nasopharyngeal Swab; Nasopharyngeal(NP) swabs in vial transport medium  Result Value Ref Range Status   SARS Coronavirus 2 by RT PCR NEGATIVE NEGATIVE Final    Comment: (NOTE) SARS-CoV-2 target nucleic acids are NOT DETECTED.  The SARS-CoV-2 RNA is generally detectable in upper respiratory specimens during the acute phase of infection. The lowest concentration of SARS-CoV-2 viral copies this assay can detect is 138 copies/mL. A negative result does not preclude SARS-Cov-2 infection and should not be used as the sole basis for treatment or other patient management decisions. A negative result may  occur with  improper specimen collection/handling, submission of specimen other than nasopharyngeal swab, presence of viral mutation(s) within the areas targeted by this assay, and inadequate number of viral copies(<138 copies/mL). A negative result must be combined with clinical observations, patient history, and epidemiological information. The expected result is Negative.  Fact Sheet for Patients:  EntrepreneurPulse.com.au  Fact Sheet for Healthcare Providers:  IncredibleEmployment.be  This test is no t yet approved or cleared by the [place] FDA and  has been authorized for detection and/or diagnosis of SARS-CoV-2 by FDA under an Emergency Use Authorization (EUA). This EUA will remain  in effect (meaning this test can be used) for the duration of the COVID-19 declaration under Section 564(b)(1) of the Act, 21 U.S.C.section 360bbb-3(b)(1), unless the authorization is terminated  or revoked sooner.       Influenza A by PCR NEGATIVE NEGATIVE Final   Influenza B by PCR NEGATIVE NEGATIVE Final    Comment: (NOTE) The  Xpert Xpress SARS-CoV-2/FLU/RSV plus assay is intended as an aid in the diagnosis of influenza from Nasopharyngeal swab specimens and should not be used as a sole basis for treatment. Nasal washings and aspirates are unacceptable for Xpert Xpress SARS-CoV-2/FLU/RSV testing.  Fact Sheet for Patients: EntrepreneurPulse.com.au  Fact Sheet for Healthcare Providers: IncredibleEmployment.be  This test is not yet approved or cleared by the [place] FDA and has been authorized for detection and/or diagnosis of SARS-CoV-2 by FDA under an Emergency Use Authorization (EUA). This EUA will remain in effect (meaning this test can be used) for the duration of the COVID-19 declaration under Section 564(b)(1) of the Act, 21 U.S.C. section 360bbb-3(b)(1), unless the authorization is terminated  or revoked.  Performed at [facility], [place] [address] [place], [place] [address]   Culture, blood (routine x 2)     Status: None (Preliminary result)   Collection Time: 06/08/21  7:58 AM   Specimen: BLOOD  Result Value Ref Range Status   Specimen Description   Final    BLOOD RIGHT ANTECUBITAL Performed at [facility] [address] [place], [place] [address]    Special Requests   Final    BOTTLES DRAWN AEROBIC ONLY Blood Culture adequate volume Performed at [facility] [address], [place], [place] [address]    Culture   Final    NO GROWTH 3 DAYS Performed at [facility], [place] [address] [place], [place] [address]    Report Status PENDING  Incomplete  Culture, blood (routine x 2)     Status: None (Preliminary result)   Collection Time: 06/08/21  7:58 AM   Specimen: BLOOD  Result Value Ref Range Status   Specimen Description   Final    BLOOD BLOOD RIGHT HAND Performed at [facility] [address], [place], [place] [address]    Special Requests   Final    BOTTLES DRAWN AEROBIC ONLY Blood Culture adequate volume Performed at [facility] [address], [place], [place] [address]    Culture   Final    NO GROWTH 3 DAYS Performed at [facility], [place] [address] [place], [place] [address]    Report Status PENDING  Incomplete     Labs: BNP (last 3 results) No results for input(s): BNP in the last 8760 hours. Basic Metabolic Panel: Recent Labs  Lab 06/07/21 2138 06/09/21 0515  NA 137 137  K 4.4 3.5  CL 105 103  CO2 27 23  GLUCOSE 104* 74  BUN 19 12  CREATININE 0.94 0.62  CALCIUM 9.0 8.5*   Liver Function Tests: Recent Labs  Lab 06/07/21 2138  AST 19  ALT 12  ALKPHOS 86  BILITOT 0.8  PROT 7.1  ALBUMIN 3.8   No results for input(s): LIPASE, AMYLASE in the last 168 hours. Recent Labs  Lab 06/08/21 0615  AMMONIA 19   CBC: Recent  Labs  Lab 06/07/21 2138  WBC 5.0  HGB 12.5  HCT 37.6  MCV 87.4  PLT 149*   Cardiac Enzymes: No results for input(s): CKTOTAL, CKMB, CKMBINDEX, TROPONINI in the last 168 hours. BNP: Invalid input(s): POCBNP CBG: No results for input(s): GLUCAP in the last 168 hours. D-Dimer No results for input(s): DDIMER in the last 72 hours. Hgb A1c No results for input(s): HGBA1C in the last 72 hours. Lipid Profile No results for input(s): CHOL, HDL, LDLCALC, TRIG, CHOLHDL, LDLDIRECT in the last 72 hours. Thyroid  function studies No results for input(s): TSH, T4TOTAL, T3FREE, THYROIDAB in the last 72 hours.  Invalid input(s): FREET3 Anemia work up No results for input(s): VITAMINB12, FOLATE, FERRITIN, TIBC, IRON, RETICCTPCT in the last 72 hours. Urinalysis    Component Value Date/Time   COLORURINE AMBER (A) 06/07/2021 2140   APPEARANCEUR CLOUDY (A) 06/07/2021 2140   LABSPEC 1.018 06/07/2021 2140   LABSPEC 1.015 06/05/2008 0907   PHURINE 6.0 06/07/2021 2140   GLUCOSEU NEGATIVE 06/07/2021 2140   HGBUR NEGATIVE 06/07/2021 2140   BILIRUBINUR NEGATIVE 06/07/2021 2140   BILIRUBINUR Negative 06/05/2008 0907   KETONESUR 5 (A) 06/07/2021 2140   PROTEINUR NEGATIVE 06/07/2021 2140   UROBILINOGEN 0.2 09/20/2012 0117   NITRITE NEGATIVE 06/07/2021 2140   LEUKOCYTESUR NEGATIVE 06/07/2021 2140   LEUKOCYTESUR Negative 06/05/2008 0907   Sepsis Labs Invalid input(s): PROCALCITONIN,  WBC,  LACTICIDVEN Microbiology Recent Results (from the past 240 hour(s))  Urine Culture     Status: Abnormal   Collection Time: 06/07/21  9:40 PM   Specimen: In/Out Cath Urine  Result Value Ref Range Status   Specimen Description   Final    IN/OUT CATH URINE Performed at [facility], [place] [address] [place], [place] [address]    Special Requests   Final    NONE Performed at [facility], [place] [address] [place], [place] [address]    Culture (A)  Final    >=100,000  COLONIES/mL AEROCOCCUS URINAE Standardized susceptibility testing for this organism is not available. Performed at [facility], [place] [address], [place], [place] [address]    Report Status 06/11/2021 FINAL  Final  Resp Panel by RT-PCR (Flu A&B, Covid) Nasopharyngeal Swab     Status: None   Collection Time: 06/07/21 10:06 PM   Specimen: Nasopharyngeal Swab; Nasopharyngeal(NP) swabs in vial transport medium  Result Value Ref Range Status   SARS Coronavirus 2 by RT PCR NEGATIVE NEGATIVE Final    Comment: (NOTE) SARS-CoV-2 target nucleic acids are NOT DETECTED.  The SARS-CoV-2 RNA is generally detectable in upper respiratory specimens during the acute phase of infection. The lowest concentration of SARS-CoV-2 viral copies this assay can detect is 138 copies/mL. A negative result does not preclude SARS-Cov-2 infection and should not be used as the sole basis for treatment or other patient management decisions. A negative result may occur with  improper specimen collection/handling, submission of specimen other than nasopharyngeal swab, presence of viral mutation(s) within the areas targeted by this assay, and inadequate number of viral copies(<138 copies/mL). A negative result must be combined with clinical observations, patient history, and epidemiological information. The expected result is Negative.  Fact Sheet for Patients:  EntrepreneurPulse.com.au  Fact Sheet for Healthcare Providers:  IncredibleEmployment.be  This test is no t yet approved or cleared by the [place] FDA and  has been authorized for detection and/or diagnosis of SARS-CoV-2 by FDA under an Emergency Use Authorization (EUA). This EUA will remain  in effect (meaning this test can be used) for the duration of the COVID-19 declaration under Section 564(b)(1) of the Act, 21 U.S.C.section 360bbb-3(b)(1), unless the authorization is terminated  or revoked sooner.        Influenza A by PCR NEGATIVE NEGATIVE Final   Influenza B by PCR NEGATIVE NEGATIVE Final    Comment: (NOTE) The Xpert Xpress SARS-CoV-2/FLU/RSV plus assay is intended as an aid in the diagnosis of influenza from Nasopharyngeal swab specimens and should not be used as a sole basis for treatment.  Nasal washings and aspirates are unacceptable for Xpert Xpress SARS-CoV-2/FLU/RSV testing.  Fact Sheet for Patients: EntrepreneurPulse.com.au  Fact Sheet for Healthcare Providers: IncredibleEmployment.be  This test is not yet approved or cleared by the [place] FDA and has been authorized for detection and/or diagnosis of SARS-CoV-2 by FDA under an Emergency Use Authorization (EUA). This EUA will remain in effect (meaning this test can be used) for the duration of the COVID-19 declaration under Section 564(b)(1) of the Act, 21 U.S.C. section 360bbb-3(b)(1), unless the authorization is terminated or revoked.  Performed at [facility], [place] [address], [place], [place] [address]   Culture, blood (routine x 2)     Status: None (Preliminary result)   Collection Time: 06/08/21  7:58 AM   Specimen: BLOOD  Result Value Ref Range Status   Specimen Description   Final    BLOOD RIGHT ANTECUBITAL Performed at [facility] [address], [place], [place] [address]    Special Requests   Final    BOTTLES DRAWN AEROBIC ONLY Blood Culture adequate volume Performed at [facility] [address], [place], [place] [address]    Culture   Final    NO GROWTH 3 DAYS Performed at [facility], [place] [address], [place], [place] [address]    Report Status PENDING  Incomplete  Culture, blood (routine x 2)     Status: None (Preliminary result)   Collection Time: 06/08/21  7:58 AM   Specimen: BLOOD  Result Value Ref Range Status   Specimen Description   Final    BLOOD BLOOD RIGHT HAND Performed at  [facility] [address], [place], [place] [address]    Special Requests   Final    BOTTLES DRAWN AEROBIC ONLY Blood Culture adequate volume Performed at [facility] [address], [place], [place] [address]    Culture   Final    NO GROWTH 3 DAYS Performed at [facility], [place] [address] [place],  [address]    Report Status PENDING  Incomplete    Please note: You were cared for by a hospitalist during your hospital stay. Once you are discharged, your primary care physician will handle any further medical issues. Please note that NO REFILLS for any discharge medications will be authorized once you are discharged, as it is imperative that you return to your primary care physician (or establish a relationship with a primary care physician if you do not have one) for your post hospital discharge needs so that they can reassess your need for medications and monitor your lab values.    Time coordinating discharge: 40 minutes  SIGNED:   Shelly Coss, MD  [facility] 06/11/2021, 11:14 AM Pager [phone]  If 7PM-7AM, please contact night-coverage [email or web address] Password [email or web address]

## 2021-06-11 NOTE — TOC Initial Note (Signed)
Transition of Care [facility]) - Initial/Assessment Note    Patient Details  Name: [name] MRN: [id number] Date of Birth: September 14, 1927  Transition of Care [facility]) CM/SW Contact:    Lynnell Catalan, RN Phone Number: 06/11/2021, 10:31 AM  Clinical Narrative:                 Spoke with husband [name] for dc planning as pt is confused. [name] states that he has sitters at home for pt and he is able to transport her via his car. [name] is requesting home health services through [facility] which he has had in the past. [place] liaison contacted for resumption services. MD orders received   Expected Discharge Plan: [place] Barriers to Discharge: No Barriers Identified   Patient Goals and CMS Choice   CMS Medicare.gov Compare Post Acute Care list provided to:: Patient Represenative (must comment) (husband) Choice offered to / list presented to : Spouse  Expected Discharge Plan and Services Expected Discharge Plan: [place]   Discharge Planning Services: CM Consult Post Acute Care Choice: [place] arrangements for the past 2 months: Single Family Home                           HH Arranged: PT, OT HH Agency: [place] Date [place]: 06/11/21 Time HH Agency Contacted: 4 Representative spoke with at [place]: [place] Arrangements/Services Living arrangements for the past 2 months: [place] with:: Spouse Patient language and need for interpreter reviewed:: Yes        Need for Family Participation in Patient Care: Yes (Comment) Care giver support system in place?: Yes (comment) Current home services: Homehealth aide, Home PT Criminal Activity/Legal Involvement Pertinent to Current Situation/Hospitalization: No - Comment as needed  Activities of Daily Living Home Assistive Devices/Equipment: Environmental consultant (specify type) ADL Screening (condition at time of admission) Patient's  cognitive ability adequate to safely complete daily activities?: No Is the patient deaf or have difficulty hearing?: No Does the patient have difficulty seeing, even when wearing glasses/contacts?: No Does the patient have difficulty concentrating, remembering, or making decisions?: Yes Patient able to express need for assistance with ADLs?: Yes Does the patient have difficulty dressing or bathing?: Yes Independently performs ADLs?: No Communication: Independent Dressing (OT): Needs assistance Is this a change from baseline?: Pre-admission baseline Grooming: Needs assistance Is this a change from baseline?: Pre-admission baseline Feeding: Needs assistance Is this a change from baseline?: Pre-admission baseline Bathing: Needs assistance Is this a change from baseline?: Pre-admission baseline Toileting: Needs assistance Is this a change from baseline?: Pre-admission baseline In/Out Bed: Needs assistance Is this a change from baseline?: Pre-admission baseline Walks in Home: Needs assistance Is this a change from baseline?: Pre-admission baseline Does the patient have difficulty walking or climbing stairs?: Yes Weakness of Legs: Both Weakness of Arms/Hands: Both  Permission Sought/Granted Permission sought to share information with : Chartered certified accountant granted to share information with : Yes, Verbal Permission Granted     Permission granted to share info w AGENCY: [facility]        Emotional Assessment Appearance:: Appears stated age Attitude/Demeanor/Rapport: Inconsistent Affect (typically observed): Guarded   Alcohol / Substance Use: Not Applicable Psych Involvement: No (comment)  Admission diagnosis:  Weakness [R53.1] Generalized weakness [R53.1] Parkinson's disease ([place]) [G20] FTT (failure to thrive) in adult [R62.7] Patient Active Problem List   Diagnosis Date Noted  Protein-calorie malnutrition, severe 06/10/2021   Generalized weakness  06/08/2021   FTT (failure to thrive) in adult 06/08/2021   GERD (gastroesophageal reflux disease)    Depression    Acute metabolic encephalopathy    Leukopenia    Recurrent left pleural effusion    Weakness    Palliative care by specialist    Goals of care, counseling/discussion    DNR (do not resuscitate)    Hemothorax 01/23/2020   Multiple rib fractures 01/23/2020   Lumbar transverse process fracture ([place]) 01/23/2020   Hypertensive urgency 01/23/2020   Anemia 01/23/2020   Closed fracture of right proximal humerus 05/31/2019   GIB (gastrointestinal bleeding) 02/03/2017   CAP (community acquired pneumonia) 04/13/2016   PNA (pneumonia) 04/13/2016   Murmur 09/02/2015   Parkinson's disease ([place]) 09/02/2015   Bilateral carotid bruits 09/02/2015   Confusion 08/07/2015   Acute encephalopathy 08/07/2015   Lower urinary tract infectious disease 08/07/2015   Acute kidney failure ([place]) 08/07/2015   Paralysis agitans ([place]) 09/19/2012   Lymphoma ([place]) 07/03/2011   Malignant neoplasm of female breast ([place]) 07/03/2011   Abnormal CT of the chest 03/13/2011   HYPERTENSION, BENIGN 01/01/2009   EDEMA 01/01/2009   PCP:  [name], MD Pharmacy:   [facility], [address] [name] [address] Phone: [phone] Fax: [phone]  [id number] - [place], [place] [address] [name] [address] Phone: [phone] Fax: [phone]     Social Determinants of Health (SDOH) Interventions    Readmission Risk Interventions No flowsheet data found.

## 2021-06-13 DIAGNOSIS — F0283 Dementia in other diseases classified elsewhere, unspecified severity, with mood disturbance: Secondary | ICD-10-CM | POA: Diagnosis not present

## 2021-06-13 DIAGNOSIS — R339 Retention of urine, unspecified: Secondary | ICD-10-CM | POA: Diagnosis not present

## 2021-06-13 DIAGNOSIS — F329 Major depressive disorder, single episode, unspecified: Secondary | ICD-10-CM | POA: Diagnosis not present

## 2021-06-13 DIAGNOSIS — N1832 Chronic kidney disease, stage 3b: Secondary | ICD-10-CM | POA: Diagnosis not present

## 2021-06-13 DIAGNOSIS — D631 Anemia in chronic kidney disease: Secondary | ICD-10-CM | POA: Diagnosis not present

## 2021-06-13 DIAGNOSIS — Z9013 Acquired absence of bilateral breasts and nipples: Secondary | ICD-10-CM | POA: Diagnosis not present

## 2021-06-13 DIAGNOSIS — Z8572 Personal history of non-Hodgkin lymphomas: Secondary | ICD-10-CM | POA: Diagnosis not present

## 2021-06-13 DIAGNOSIS — J45909 Unspecified asthma, uncomplicated: Secondary | ICD-10-CM | POA: Diagnosis not present

## 2021-06-13 DIAGNOSIS — R1310 Dysphagia, unspecified: Secondary | ICD-10-CM | POA: Diagnosis not present

## 2021-06-13 DIAGNOSIS — Z853 Personal history of malignant neoplasm of breast: Secondary | ICD-10-CM | POA: Diagnosis not present

## 2021-06-13 DIAGNOSIS — I350 Nonrheumatic aortic (valve) stenosis: Secondary | ICD-10-CM | POA: Diagnosis not present

## 2021-06-13 DIAGNOSIS — G20 Parkinson's disease: Secondary | ICD-10-CM | POA: Diagnosis not present

## 2021-06-13 DIAGNOSIS — G9341 Metabolic encephalopathy: Secondary | ICD-10-CM | POA: Diagnosis not present

## 2021-06-13 DIAGNOSIS — H9193 Unspecified hearing loss, bilateral: Secondary | ICD-10-CM | POA: Diagnosis not present

## 2021-06-13 DIAGNOSIS — I129 Hypertensive chronic kidney disease with stage 1 through stage 4 chronic kidney disease, or unspecified chronic kidney disease: Secondary | ICD-10-CM | POA: Diagnosis not present

## 2021-06-13 DIAGNOSIS — K219 Gastro-esophageal reflux disease without esophagitis: Secondary | ICD-10-CM | POA: Diagnosis not present

## 2021-06-13 LAB — CULTURE, BLOOD (ROUTINE X 2)
Culture: NO GROWTH
Culture: NO GROWTH
Special Requests: ADEQUATE
Special Requests: ADEQUATE

## 2021-06-14 DIAGNOSIS — D631 Anemia in chronic kidney disease: Secondary | ICD-10-CM | POA: Diagnosis not present

## 2021-06-14 DIAGNOSIS — F0283 Dementia in other diseases classified elsewhere, unspecified severity, with mood disturbance: Secondary | ICD-10-CM | POA: Diagnosis not present

## 2021-06-14 DIAGNOSIS — G20 Parkinson's disease: Secondary | ICD-10-CM | POA: Diagnosis not present

## 2021-06-14 DIAGNOSIS — N1832 Chronic kidney disease, stage 3b: Secondary | ICD-10-CM | POA: Diagnosis not present

## 2021-06-14 DIAGNOSIS — I129 Hypertensive chronic kidney disease with stage 1 through stage 4 chronic kidney disease, or unspecified chronic kidney disease: Secondary | ICD-10-CM | POA: Diagnosis not present

## 2021-06-14 DIAGNOSIS — F329 Major depressive disorder, single episode, unspecified: Secondary | ICD-10-CM | POA: Diagnosis not present

## 2021-06-17 DIAGNOSIS — J189 Pneumonia, unspecified organism: Secondary | ICD-10-CM | POA: Diagnosis not present

## 2021-06-18 DIAGNOSIS — G20 Parkinson's disease: Secondary | ICD-10-CM | POA: Diagnosis not present

## 2021-06-18 DIAGNOSIS — N1832 Chronic kidney disease, stage 3b: Secondary | ICD-10-CM | POA: Diagnosis not present

## 2021-06-18 DIAGNOSIS — I129 Hypertensive chronic kidney disease with stage 1 through stage 4 chronic kidney disease, or unspecified chronic kidney disease: Secondary | ICD-10-CM | POA: Diagnosis not present

## 2021-06-18 DIAGNOSIS — D631 Anemia in chronic kidney disease: Secondary | ICD-10-CM | POA: Diagnosis not present

## 2021-06-18 DIAGNOSIS — F329 Major depressive disorder, single episode, unspecified: Secondary | ICD-10-CM | POA: Diagnosis not present

## 2021-06-18 DIAGNOSIS — F0283 Dementia in other diseases classified elsewhere, unspecified severity, with mood disturbance: Secondary | ICD-10-CM | POA: Diagnosis not present

## 2021-06-23 DIAGNOSIS — D631 Anemia in chronic kidney disease: Secondary | ICD-10-CM | POA: Diagnosis not present

## 2021-06-23 DIAGNOSIS — I129 Hypertensive chronic kidney disease with stage 1 through stage 4 chronic kidney disease, or unspecified chronic kidney disease: Secondary | ICD-10-CM | POA: Diagnosis not present

## 2021-06-23 DIAGNOSIS — F0283 Dementia in other diseases classified elsewhere, unspecified severity, with mood disturbance: Secondary | ICD-10-CM | POA: Diagnosis not present

## 2021-06-23 DIAGNOSIS — F329 Major depressive disorder, single episode, unspecified: Secondary | ICD-10-CM | POA: Diagnosis not present

## 2021-06-23 DIAGNOSIS — N1832 Chronic kidney disease, stage 3b: Secondary | ICD-10-CM | POA: Diagnosis not present

## 2021-06-23 DIAGNOSIS — G20 Parkinson's disease: Secondary | ICD-10-CM | POA: Diagnosis not present

## 2021-06-24 DIAGNOSIS — D631 Anemia in chronic kidney disease: Secondary | ICD-10-CM | POA: Diagnosis not present

## 2021-06-24 DIAGNOSIS — F0283 Dementia in other diseases classified elsewhere, unspecified severity, with mood disturbance: Secondary | ICD-10-CM | POA: Diagnosis not present

## 2021-06-24 DIAGNOSIS — I129 Hypertensive chronic kidney disease with stage 1 through stage 4 chronic kidney disease, or unspecified chronic kidney disease: Secondary | ICD-10-CM | POA: Diagnosis not present

## 2021-06-24 DIAGNOSIS — F329 Major depressive disorder, single episode, unspecified: Secondary | ICD-10-CM | POA: Diagnosis not present

## 2021-06-24 DIAGNOSIS — G20 Parkinson's disease: Secondary | ICD-10-CM | POA: Diagnosis not present

## 2021-06-24 DIAGNOSIS — N1832 Chronic kidney disease, stage 3b: Secondary | ICD-10-CM | POA: Diagnosis not present

## 2021-06-26 DIAGNOSIS — I129 Hypertensive chronic kidney disease with stage 1 through stage 4 chronic kidney disease, or unspecified chronic kidney disease: Secondary | ICD-10-CM | POA: Diagnosis not present

## 2021-06-26 DIAGNOSIS — N1832 Chronic kidney disease, stage 3b: Secondary | ICD-10-CM | POA: Diagnosis not present

## 2021-06-26 DIAGNOSIS — D631 Anemia in chronic kidney disease: Secondary | ICD-10-CM | POA: Diagnosis not present

## 2021-06-26 DIAGNOSIS — G20 Parkinson's disease: Secondary | ICD-10-CM | POA: Diagnosis not present

## 2021-06-26 DIAGNOSIS — F0283 Dementia in other diseases classified elsewhere, unspecified severity, with mood disturbance: Secondary | ICD-10-CM | POA: Diagnosis not present

## 2021-06-26 DIAGNOSIS — F329 Major depressive disorder, single episode, unspecified: Secondary | ICD-10-CM | POA: Diagnosis not present

## 2021-06-30 DIAGNOSIS — F0283 Dementia in other diseases classified elsewhere, unspecified severity, with mood disturbance: Secondary | ICD-10-CM | POA: Diagnosis not present

## 2021-06-30 DIAGNOSIS — I129 Hypertensive chronic kidney disease with stage 1 through stage 4 chronic kidney disease, or unspecified chronic kidney disease: Secondary | ICD-10-CM | POA: Diagnosis not present

## 2021-06-30 DIAGNOSIS — D631 Anemia in chronic kidney disease: Secondary | ICD-10-CM | POA: Diagnosis not present

## 2021-06-30 DIAGNOSIS — G20 Parkinson's disease: Secondary | ICD-10-CM | POA: Diagnosis not present

## 2021-06-30 DIAGNOSIS — N1832 Chronic kidney disease, stage 3b: Secondary | ICD-10-CM | POA: Diagnosis not present

## 2021-06-30 DIAGNOSIS — F329 Major depressive disorder, single episode, unspecified: Secondary | ICD-10-CM | POA: Diagnosis not present

## 2021-07-03 DIAGNOSIS — N1832 Chronic kidney disease, stage 3b: Secondary | ICD-10-CM | POA: Diagnosis not present

## 2021-07-03 DIAGNOSIS — F0283 Dementia in other diseases classified elsewhere, unspecified severity, with mood disturbance: Secondary | ICD-10-CM | POA: Diagnosis not present

## 2021-07-03 DIAGNOSIS — I129 Hypertensive chronic kidney disease with stage 1 through stage 4 chronic kidney disease, or unspecified chronic kidney disease: Secondary | ICD-10-CM | POA: Diagnosis not present

## 2021-07-03 DIAGNOSIS — G20 Parkinson's disease: Secondary | ICD-10-CM | POA: Diagnosis not present

## 2021-07-03 DIAGNOSIS — F329 Major depressive disorder, single episode, unspecified: Secondary | ICD-10-CM | POA: Diagnosis not present

## 2021-07-03 DIAGNOSIS — D631 Anemia in chronic kidney disease: Secondary | ICD-10-CM | POA: Diagnosis not present

## 2021-07-07 NOTE — Progress Notes (Deleted)
Assessment/Plan:   1.  Parkinsons Disease  -Continue carbidopa/levodopa 25/100, 1/1/2 2.  Advanced PDD             -Has 24 hour/day care.               -she has home health aide from 9am to 1pm             -Had social worker meet with patient and husband.             -Off donepezil due to nausea and vomiting 3.  Urinary incontinence             -Follows with urology  4 Subjective:   [name] was seen today in follow up for Parkinsons disease.  My previous records were reviewed prior to todays visit as well as outside records available to me.  Patient's husband present and supplements most of the history.  Patient was in the hospital last month.  She presented with increasing confusion and generalized weakness.  She was diagnosed with community-acquired pneumonia, possible aspiration pneumonia.  Speech therapy saw the patient and recommended a dysphagia 3 diet.  Blood pressure was elevated in the hospital.  She was started on antihypertensives. Current prescribed movement disorder medications: Carbidopa/levodopa 25/100, 1/1/2    PREVIOUS MEDICATIONS: Donepezil (nausea/vomiting)  ALLERGIES:   Allergies  Allergen Reactions   Azithromycin Other (See Comments)    confusion   Tramadol Other (See Comments)    Hallucinations    CURRENT MEDICATIONS:  Outpatient Encounter Medications as of 07/09/2021  Medication Sig   acetaminophen (TYLENOL) 325 MG tablet Take 650 mg by mouth every 6 (six) hours as needed for mild pain, moderate pain, fever or headache.    acyclovir (ZOVIRAX) 400 MG tablet TAKE 1 TABLET BY MOUTH TWICE DAILY   amLODipine (NORVASC) 10 MG tablet Take 1 tablet (10 mg total) by mouth daily.   Calcium Carbonate (CALCIUM-CARB 600 PO) Take 600 mg by mouth daily.   carbidopa-levodopa (SINEMET IR) 25-100 MG tablet TAKE 1 TABLET BY MOUTH DAILY EVERY MORNING, 1 IN THE AFTERNOON, AND 2 IN THE EVENING (Patient taking differently: Take 1 tablet by mouth See admin  instructions. TAKE 1 TABLET BY MOUTH DAILY EVERY MORNING, 1 IN THE AFTERNOON, AND 2 IN THE EVENING)   cholecalciferol (VITAMIN D) 1000 units tablet Take 1,000 Units by mouth every evening.    docusate sodium (COLACE) 100 MG capsule Take 100 mg by mouth daily as needed for mild constipation.   sertraline (ZOLOFT) 100 MG tablet Take 1 tablet (100 mg total) by mouth daily.   No facility-administered encounter medications on file as of 07/09/2021.    Objective:   PHYSICAL EXAMINATION:    VITALS:   There were no vitals filed for this visit.   GEN:  The patient appears stated age and is in NAD. HEENT:  Normocephalic, atraumatic.  The mucous membranes are moist. The superficial temporal arteries are without ropiness or tenderness. CV:  RRR Lungs:  CTAB Neck/HEME:  There are no carotid bruits bilaterally.  Neurological examination:  Orientation: The patient is alert and oriented to person only Cranial nerves: There is good facial symmetry with significant facial hypomimia. The speech is fluent and clear. Soft palate rises symmetrically and there is no tongue deviation. Hearing is intact to conversational tone. Sensation: Sensation is intact to light touch throughout Motor: Strength is at least antigravity x4.  Movement examination: Tone: There is mild increased tone in the RUE (some gegenhalten)  Abnormal movements: none Coordination:  There is  decremation with RAM's, but this is mostly apraxia. Gait and Station: The patient requires assist [place].  She is given a walker.  She is slow.   I have reviewed and interpreted the following labs independently    Chemistry      Component Value Date/Time   NA 137 06/09/2021 0515   NA 143 04/13/2017 1155   K 3.5 06/09/2021 0515   K 4.1 04/13/2017 1155   CL 103 06/09/2021 0515   CL 106 12/26/2012 1110   CO2 23 06/09/2021 0515   CO2 27 04/13/2017 1155   BUN 12 06/09/2021 0515   BUN 19.5 04/13/2017 1155   CREATININE 0.62 06/09/2021 0515    CREATININE 0.88 05/27/2020 1134   CREATININE 1.2 (H) 04/13/2017 1155      Component Value Date/Time   CALCIUM 8.5 (L) 06/09/2021 0515   CALCIUM 9.6 04/13/2017 1155   ALKPHOS 86 06/07/2021 2138   ALKPHOS 97 04/13/2017 1155   AST 19 06/07/2021 2138   AST 16 05/27/2020 1134   AST 20 04/13/2017 1155   ALT 12 06/07/2021 2138   ALT <6 05/27/2020 1134   ALT 7 04/13/2017 1155   BILITOT 0.8 06/07/2021 2138   BILITOT 0.4 05/27/2020 1134   BILITOT 0.31 04/13/2017 1155       Lab Results  Component Value Date   WBC 5.0 06/07/2021   HGB 12.5 06/07/2021   HCT 37.6 06/07/2021   MCV 87.4 06/07/2021   PLT 149 (L) 06/07/2021    Lab Results  Component Value Date   TSH 3.325 06/08/2021     Total time spent on today's visit was *** minutes, including both face-to-face time and nonface-to-face time.  Time included that spent on review of records (prior notes available to me/labs/imaging if pertinent), discussing treatment and goals, answering patient's questions and coordinating care.  Cc:  [name], MD

## 2021-07-08 DIAGNOSIS — D631 Anemia in chronic kidney disease: Secondary | ICD-10-CM | POA: Diagnosis not present

## 2021-07-08 DIAGNOSIS — N1832 Chronic kidney disease, stage 3b: Secondary | ICD-10-CM | POA: Diagnosis not present

## 2021-07-08 DIAGNOSIS — G20 Parkinson's disease: Secondary | ICD-10-CM | POA: Diagnosis not present

## 2021-07-08 DIAGNOSIS — F329 Major depressive disorder, single episode, unspecified: Secondary | ICD-10-CM | POA: Diagnosis not present

## 2021-07-08 DIAGNOSIS — I129 Hypertensive chronic kidney disease with stage 1 through stage 4 chronic kidney disease, or unspecified chronic kidney disease: Secondary | ICD-10-CM | POA: Diagnosis not present

## 2021-07-08 DIAGNOSIS — F0283 Dementia in other diseases classified elsewhere, unspecified severity, with mood disturbance: Secondary | ICD-10-CM | POA: Diagnosis not present

## 2021-07-09 ENCOUNTER — Ambulatory Visit: Payer: Medicare Other | Admitting: Neurology

## 2021-07-10 DIAGNOSIS — G20 Parkinson's disease: Secondary | ICD-10-CM | POA: Diagnosis not present

## 2021-07-10 DIAGNOSIS — N1832 Chronic kidney disease, stage 3b: Secondary | ICD-10-CM | POA: Diagnosis not present

## 2021-07-10 DIAGNOSIS — I129 Hypertensive chronic kidney disease with stage 1 through stage 4 chronic kidney disease, or unspecified chronic kidney disease: Secondary | ICD-10-CM | POA: Diagnosis not present

## 2021-07-10 DIAGNOSIS — F329 Major depressive disorder, single episode, unspecified: Secondary | ICD-10-CM | POA: Diagnosis not present

## 2021-07-10 DIAGNOSIS — F0283 Dementia in other diseases classified elsewhere, unspecified severity, with mood disturbance: Secondary | ICD-10-CM | POA: Diagnosis not present

## 2021-07-10 DIAGNOSIS — D631 Anemia in chronic kidney disease: Secondary | ICD-10-CM | POA: Diagnosis not present

## 2021-07-13 DIAGNOSIS — E1122 Type 2 diabetes mellitus with diabetic chronic kidney disease: Secondary | ICD-10-CM | POA: Diagnosis not present

## 2021-07-13 DIAGNOSIS — F0284 Dementia in other diseases classified elsewhere, unspecified severity, with anxiety: Secondary | ICD-10-CM | POA: Diagnosis not present

## 2021-07-13 DIAGNOSIS — J189 Pneumonia, unspecified organism: Secondary | ICD-10-CM | POA: Diagnosis not present

## 2021-07-13 DIAGNOSIS — Z853 Personal history of malignant neoplasm of breast: Secondary | ICD-10-CM | POA: Diagnosis not present

## 2021-07-13 DIAGNOSIS — I350 Nonrheumatic aortic (valve) stenosis: Secondary | ICD-10-CM | POA: Diagnosis not present

## 2021-07-13 DIAGNOSIS — G20 Parkinson's disease: Secondary | ICD-10-CM | POA: Diagnosis not present

## 2021-07-13 DIAGNOSIS — F329 Major depressive disorder, single episode, unspecified: Secondary | ICD-10-CM | POA: Diagnosis not present

## 2021-07-13 DIAGNOSIS — J4520 Mild intermittent asthma, uncomplicated: Secondary | ICD-10-CM | POA: Diagnosis not present

## 2021-07-13 DIAGNOSIS — H9193 Unspecified hearing loss, bilateral: Secondary | ICD-10-CM | POA: Diagnosis not present

## 2021-07-13 DIAGNOSIS — Z9013 Acquired absence of bilateral breasts and nipples: Secondary | ICD-10-CM | POA: Diagnosis not present

## 2021-07-13 DIAGNOSIS — G9341 Metabolic encephalopathy: Secondary | ICD-10-CM | POA: Diagnosis not present

## 2021-07-13 DIAGNOSIS — F0283 Dementia in other diseases classified elsewhere, unspecified severity, with mood disturbance: Secondary | ICD-10-CM | POA: Diagnosis not present

## 2021-07-13 DIAGNOSIS — Z8572 Personal history of non-Hodgkin lymphomas: Secondary | ICD-10-CM | POA: Diagnosis not present

## 2021-07-13 DIAGNOSIS — R1310 Dysphagia, unspecified: Secondary | ICD-10-CM | POA: Diagnosis not present

## 2021-07-13 DIAGNOSIS — K219 Gastro-esophageal reflux disease without esophagitis: Secondary | ICD-10-CM | POA: Diagnosis not present

## 2021-07-13 DIAGNOSIS — D631 Anemia in chronic kidney disease: Secondary | ICD-10-CM | POA: Diagnosis not present

## 2021-07-13 DIAGNOSIS — N1832 Chronic kidney disease, stage 3b: Secondary | ICD-10-CM | POA: Diagnosis not present

## 2021-07-13 DIAGNOSIS — R339 Retention of urine, unspecified: Secondary | ICD-10-CM | POA: Diagnosis not present

## 2021-07-13 DIAGNOSIS — I129 Hypertensive chronic kidney disease with stage 1 through stage 4 chronic kidney disease, or unspecified chronic kidney disease: Secondary | ICD-10-CM | POA: Diagnosis not present

## 2021-07-14 DIAGNOSIS — F329 Major depressive disorder, single episode, unspecified: Secondary | ICD-10-CM | POA: Diagnosis not present

## 2021-07-14 DIAGNOSIS — I129 Hypertensive chronic kidney disease with stage 1 through stage 4 chronic kidney disease, or unspecified chronic kidney disease: Secondary | ICD-10-CM | POA: Diagnosis not present

## 2021-07-14 DIAGNOSIS — F0284 Dementia in other diseases classified elsewhere, unspecified severity, with anxiety: Secondary | ICD-10-CM | POA: Diagnosis not present

## 2021-07-14 DIAGNOSIS — J189 Pneumonia, unspecified organism: Secondary | ICD-10-CM | POA: Diagnosis not present

## 2021-07-14 DIAGNOSIS — G20 Parkinson's disease: Secondary | ICD-10-CM | POA: Diagnosis not present

## 2021-07-14 DIAGNOSIS — F0283 Dementia in other diseases classified elsewhere, unspecified severity, with mood disturbance: Secondary | ICD-10-CM | POA: Diagnosis not present

## 2021-07-16 DIAGNOSIS — G20 Parkinson's disease: Secondary | ICD-10-CM | POA: Diagnosis not present

## 2021-07-16 DIAGNOSIS — F0284 Dementia in other diseases classified elsewhere, unspecified severity, with anxiety: Secondary | ICD-10-CM | POA: Diagnosis not present

## 2021-07-16 DIAGNOSIS — F329 Major depressive disorder, single episode, unspecified: Secondary | ICD-10-CM | POA: Diagnosis not present

## 2021-07-16 DIAGNOSIS — F0283 Dementia in other diseases classified elsewhere, unspecified severity, with mood disturbance: Secondary | ICD-10-CM | POA: Diagnosis not present

## 2021-07-16 DIAGNOSIS — I129 Hypertensive chronic kidney disease with stage 1 through stage 4 chronic kidney disease, or unspecified chronic kidney disease: Secondary | ICD-10-CM | POA: Diagnosis not present

## 2021-07-16 DIAGNOSIS — J189 Pneumonia, unspecified organism: Secondary | ICD-10-CM | POA: Diagnosis not present

## 2021-07-17 DIAGNOSIS — F0284 Dementia in other diseases classified elsewhere, unspecified severity, with anxiety: Secondary | ICD-10-CM | POA: Diagnosis not present

## 2021-07-17 DIAGNOSIS — F0283 Dementia in other diseases classified elsewhere, unspecified severity, with mood disturbance: Secondary | ICD-10-CM | POA: Diagnosis not present

## 2021-07-17 DIAGNOSIS — J189 Pneumonia, unspecified organism: Secondary | ICD-10-CM | POA: Diagnosis not present

## 2021-07-17 DIAGNOSIS — F329 Major depressive disorder, single episode, unspecified: Secondary | ICD-10-CM | POA: Diagnosis not present

## 2021-07-17 DIAGNOSIS — G20 Parkinson's disease: Secondary | ICD-10-CM | POA: Diagnosis not present

## 2021-07-17 DIAGNOSIS — I129 Hypertensive chronic kidney disease with stage 1 through stage 4 chronic kidney disease, or unspecified chronic kidney disease: Secondary | ICD-10-CM | POA: Diagnosis not present

## 2021-07-21 DIAGNOSIS — F0283 Dementia in other diseases classified elsewhere, unspecified severity, with mood disturbance: Secondary | ICD-10-CM | POA: Diagnosis not present

## 2021-07-21 DIAGNOSIS — F0284 Dementia in other diseases classified elsewhere, unspecified severity, with anxiety: Secondary | ICD-10-CM | POA: Diagnosis not present

## 2021-07-21 DIAGNOSIS — G20 Parkinson's disease: Secondary | ICD-10-CM | POA: Diagnosis not present

## 2021-07-21 DIAGNOSIS — J189 Pneumonia, unspecified organism: Secondary | ICD-10-CM | POA: Diagnosis not present

## 2021-07-21 DIAGNOSIS — I129 Hypertensive chronic kidney disease with stage 1 through stage 4 chronic kidney disease, or unspecified chronic kidney disease: Secondary | ICD-10-CM | POA: Diagnosis not present

## 2021-07-21 DIAGNOSIS — F329 Major depressive disorder, single episode, unspecified: Secondary | ICD-10-CM | POA: Diagnosis not present

## 2021-07-22 DIAGNOSIS — J189 Pneumonia, unspecified organism: Secondary | ICD-10-CM | POA: Diagnosis not present

## 2021-07-22 DIAGNOSIS — F0284 Dementia in other diseases classified elsewhere, unspecified severity, with anxiety: Secondary | ICD-10-CM | POA: Diagnosis not present

## 2021-07-22 DIAGNOSIS — G20 Parkinson's disease: Secondary | ICD-10-CM | POA: Diagnosis not present

## 2021-07-22 DIAGNOSIS — I129 Hypertensive chronic kidney disease with stage 1 through stage 4 chronic kidney disease, or unspecified chronic kidney disease: Secondary | ICD-10-CM | POA: Diagnosis not present

## 2021-07-22 DIAGNOSIS — F329 Major depressive disorder, single episode, unspecified: Secondary | ICD-10-CM | POA: Diagnosis not present

## 2021-07-22 DIAGNOSIS — F0283 Dementia in other diseases classified elsewhere, unspecified severity, with mood disturbance: Secondary | ICD-10-CM | POA: Diagnosis not present

## 2021-07-24 DIAGNOSIS — F329 Major depressive disorder, single episode, unspecified: Secondary | ICD-10-CM | POA: Diagnosis not present

## 2021-07-24 DIAGNOSIS — D631 Anemia in chronic kidney disease: Secondary | ICD-10-CM | POA: Diagnosis not present

## 2021-07-24 DIAGNOSIS — I129 Hypertensive chronic kidney disease with stage 1 through stage 4 chronic kidney disease, or unspecified chronic kidney disease: Secondary | ICD-10-CM | POA: Diagnosis not present

## 2021-07-24 DIAGNOSIS — G20 Parkinson's disease: Secondary | ICD-10-CM | POA: Diagnosis not present

## 2021-07-29 DIAGNOSIS — I129 Hypertensive chronic kidney disease with stage 1 through stage 4 chronic kidney disease, or unspecified chronic kidney disease: Secondary | ICD-10-CM | POA: Diagnosis not present

## 2021-07-29 DIAGNOSIS — J189 Pneumonia, unspecified organism: Secondary | ICD-10-CM | POA: Diagnosis not present

## 2021-07-29 DIAGNOSIS — F329 Major depressive disorder, single episode, unspecified: Secondary | ICD-10-CM | POA: Diagnosis not present

## 2021-07-29 DIAGNOSIS — F0284 Dementia in other diseases classified elsewhere, unspecified severity, with anxiety: Secondary | ICD-10-CM | POA: Diagnosis not present

## 2021-07-29 DIAGNOSIS — G20 Parkinson's disease: Secondary | ICD-10-CM | POA: Diagnosis not present

## 2021-07-29 DIAGNOSIS — F0283 Dementia in other diseases classified elsewhere, unspecified severity, with mood disturbance: Secondary | ICD-10-CM | POA: Diagnosis not present

## 2021-07-30 DIAGNOSIS — G20 Parkinson's disease: Secondary | ICD-10-CM | POA: Diagnosis not present

## 2021-07-30 DIAGNOSIS — F0283 Dementia in other diseases classified elsewhere, unspecified severity, with mood disturbance: Secondary | ICD-10-CM | POA: Diagnosis not present

## 2021-07-30 DIAGNOSIS — F329 Major depressive disorder, single episode, unspecified: Secondary | ICD-10-CM | POA: Diagnosis not present

## 2021-07-30 DIAGNOSIS — I129 Hypertensive chronic kidney disease with stage 1 through stage 4 chronic kidney disease, or unspecified chronic kidney disease: Secondary | ICD-10-CM | POA: Diagnosis not present

## 2021-07-30 DIAGNOSIS — J189 Pneumonia, unspecified organism: Secondary | ICD-10-CM | POA: Diagnosis not present

## 2021-07-30 DIAGNOSIS — F0284 Dementia in other diseases classified elsewhere, unspecified severity, with anxiety: Secondary | ICD-10-CM | POA: Diagnosis not present

## 2021-07-31 DIAGNOSIS — G20 Parkinson's disease: Secondary | ICD-10-CM | POA: Diagnosis not present

## 2021-07-31 DIAGNOSIS — F329 Major depressive disorder, single episode, unspecified: Secondary | ICD-10-CM | POA: Diagnosis not present

## 2021-07-31 DIAGNOSIS — F0283 Dementia in other diseases classified elsewhere, unspecified severity, with mood disturbance: Secondary | ICD-10-CM | POA: Diagnosis not present

## 2021-07-31 DIAGNOSIS — F0284 Dementia in other diseases classified elsewhere, unspecified severity, with anxiety: Secondary | ICD-10-CM | POA: Diagnosis not present

## 2021-07-31 DIAGNOSIS — J189 Pneumonia, unspecified organism: Secondary | ICD-10-CM | POA: Diagnosis not present

## 2021-07-31 DIAGNOSIS — I129 Hypertensive chronic kidney disease with stage 1 through stage 4 chronic kidney disease, or unspecified chronic kidney disease: Secondary | ICD-10-CM | POA: Diagnosis not present

## 2021-08-04 DIAGNOSIS — G20 Parkinson's disease: Secondary | ICD-10-CM | POA: Diagnosis not present

## 2021-08-04 DIAGNOSIS — J189 Pneumonia, unspecified organism: Secondary | ICD-10-CM | POA: Diagnosis not present

## 2021-08-04 DIAGNOSIS — F0284 Dementia in other diseases classified elsewhere, unspecified severity, with anxiety: Secondary | ICD-10-CM | POA: Diagnosis not present

## 2021-08-04 DIAGNOSIS — F0283 Dementia in other diseases classified elsewhere, unspecified severity, with mood disturbance: Secondary | ICD-10-CM | POA: Diagnosis not present

## 2021-08-04 DIAGNOSIS — I129 Hypertensive chronic kidney disease with stage 1 through stage 4 chronic kidney disease, or unspecified chronic kidney disease: Secondary | ICD-10-CM | POA: Diagnosis not present

## 2021-08-04 DIAGNOSIS — F329 Major depressive disorder, single episode, unspecified: Secondary | ICD-10-CM | POA: Diagnosis not present

## 2021-08-06 DIAGNOSIS — F0284 Dementia in other diseases classified elsewhere, unspecified severity, with anxiety: Secondary | ICD-10-CM | POA: Diagnosis not present

## 2021-08-06 DIAGNOSIS — F329 Major depressive disorder, single episode, unspecified: Secondary | ICD-10-CM | POA: Diagnosis not present

## 2021-08-06 DIAGNOSIS — I129 Hypertensive chronic kidney disease with stage 1 through stage 4 chronic kidney disease, or unspecified chronic kidney disease: Secondary | ICD-10-CM | POA: Diagnosis not present

## 2021-08-06 DIAGNOSIS — F0283 Dementia in other diseases classified elsewhere, unspecified severity, with mood disturbance: Secondary | ICD-10-CM | POA: Diagnosis not present

## 2021-08-06 DIAGNOSIS — G20 Parkinson's disease: Secondary | ICD-10-CM | POA: Diagnosis not present

## 2021-08-06 DIAGNOSIS — J189 Pneumonia, unspecified organism: Secondary | ICD-10-CM | POA: Diagnosis not present

## 2021-08-07 DIAGNOSIS — F0283 Dementia in other diseases classified elsewhere, unspecified severity, with mood disturbance: Secondary | ICD-10-CM | POA: Diagnosis not present

## 2021-08-07 DIAGNOSIS — F0284 Dementia in other diseases classified elsewhere, unspecified severity, with anxiety: Secondary | ICD-10-CM | POA: Diagnosis not present

## 2021-08-07 DIAGNOSIS — G20 Parkinson's disease: Secondary | ICD-10-CM | POA: Diagnosis not present

## 2021-08-07 DIAGNOSIS — I129 Hypertensive chronic kidney disease with stage 1 through stage 4 chronic kidney disease, or unspecified chronic kidney disease: Secondary | ICD-10-CM | POA: Diagnosis not present

## 2021-08-07 DIAGNOSIS — F329 Major depressive disorder, single episode, unspecified: Secondary | ICD-10-CM | POA: Diagnosis not present

## 2021-08-07 DIAGNOSIS — J189 Pneumonia, unspecified organism: Secondary | ICD-10-CM | POA: Diagnosis not present

## 2021-08-12 DIAGNOSIS — H9193 Unspecified hearing loss, bilateral: Secondary | ICD-10-CM | POA: Diagnosis not present

## 2021-08-12 DIAGNOSIS — D631 Anemia in chronic kidney disease: Secondary | ICD-10-CM | POA: Diagnosis not present

## 2021-08-12 DIAGNOSIS — I129 Hypertensive chronic kidney disease with stage 1 through stage 4 chronic kidney disease, or unspecified chronic kidney disease: Secondary | ICD-10-CM | POA: Diagnosis not present

## 2021-08-12 DIAGNOSIS — R339 Retention of urine, unspecified: Secondary | ICD-10-CM | POA: Diagnosis not present

## 2021-08-12 DIAGNOSIS — F329 Major depressive disorder, single episode, unspecified: Secondary | ICD-10-CM | POA: Diagnosis not present

## 2021-08-12 DIAGNOSIS — R1310 Dysphagia, unspecified: Secondary | ICD-10-CM | POA: Diagnosis not present

## 2021-08-12 DIAGNOSIS — N1832 Chronic kidney disease, stage 3b: Secondary | ICD-10-CM | POA: Diagnosis not present

## 2021-08-12 DIAGNOSIS — J4520 Mild intermittent asthma, uncomplicated: Secondary | ICD-10-CM | POA: Diagnosis not present

## 2021-08-12 DIAGNOSIS — J189 Pneumonia, unspecified organism: Secondary | ICD-10-CM | POA: Diagnosis not present

## 2021-08-12 DIAGNOSIS — K219 Gastro-esophageal reflux disease without esophagitis: Secondary | ICD-10-CM | POA: Diagnosis not present

## 2021-08-12 DIAGNOSIS — F0283 Dementia in other diseases classified elsewhere, unspecified severity, with mood disturbance: Secondary | ICD-10-CM | POA: Diagnosis not present

## 2021-08-12 DIAGNOSIS — Z853 Personal history of malignant neoplasm of breast: Secondary | ICD-10-CM | POA: Diagnosis not present

## 2021-08-12 DIAGNOSIS — G20 Parkinson's disease: Secondary | ICD-10-CM | POA: Diagnosis not present

## 2021-08-12 DIAGNOSIS — F0284 Dementia in other diseases classified elsewhere, unspecified severity, with anxiety: Secondary | ICD-10-CM | POA: Diagnosis not present

## 2021-08-12 DIAGNOSIS — I350 Nonrheumatic aortic (valve) stenosis: Secondary | ICD-10-CM | POA: Diagnosis not present

## 2021-08-12 DIAGNOSIS — E1122 Type 2 diabetes mellitus with diabetic chronic kidney disease: Secondary | ICD-10-CM | POA: Diagnosis not present

## 2021-08-12 DIAGNOSIS — Z8572 Personal history of non-Hodgkin lymphomas: Secondary | ICD-10-CM | POA: Diagnosis not present

## 2021-08-12 DIAGNOSIS — G9341 Metabolic encephalopathy: Secondary | ICD-10-CM | POA: Diagnosis not present

## 2021-08-12 DIAGNOSIS — Z9013 Acquired absence of bilateral breasts and nipples: Secondary | ICD-10-CM | POA: Diagnosis not present

## 2021-08-14 DIAGNOSIS — I129 Hypertensive chronic kidney disease with stage 1 through stage 4 chronic kidney disease, or unspecified chronic kidney disease: Secondary | ICD-10-CM | POA: Diagnosis not present

## 2021-08-14 DIAGNOSIS — F329 Major depressive disorder, single episode, unspecified: Secondary | ICD-10-CM | POA: Diagnosis not present

## 2021-08-14 DIAGNOSIS — F0284 Dementia in other diseases classified elsewhere, unspecified severity, with anxiety: Secondary | ICD-10-CM | POA: Diagnosis not present

## 2021-08-14 DIAGNOSIS — G20 Parkinson's disease: Secondary | ICD-10-CM | POA: Diagnosis not present

## 2021-08-14 DIAGNOSIS — F0283 Dementia in other diseases classified elsewhere, unspecified severity, with mood disturbance: Secondary | ICD-10-CM | POA: Diagnosis not present

## 2021-08-14 DIAGNOSIS — J189 Pneumonia, unspecified organism: Secondary | ICD-10-CM | POA: Diagnosis not present

## 2021-08-15 DIAGNOSIS — F0283 Dementia in other diseases classified elsewhere, unspecified severity, with mood disturbance: Secondary | ICD-10-CM | POA: Diagnosis not present

## 2021-08-15 DIAGNOSIS — F0284 Dementia in other diseases classified elsewhere, unspecified severity, with anxiety: Secondary | ICD-10-CM | POA: Diagnosis not present

## 2021-08-15 DIAGNOSIS — G20 Parkinson's disease: Secondary | ICD-10-CM | POA: Diagnosis not present

## 2021-08-15 DIAGNOSIS — F329 Major depressive disorder, single episode, unspecified: Secondary | ICD-10-CM | POA: Diagnosis not present

## 2021-08-15 DIAGNOSIS — J189 Pneumonia, unspecified organism: Secondary | ICD-10-CM | POA: Diagnosis not present

## 2021-08-15 DIAGNOSIS — I129 Hypertensive chronic kidney disease with stage 1 through stage 4 chronic kidney disease, or unspecified chronic kidney disease: Secondary | ICD-10-CM | POA: Diagnosis not present

## 2021-08-18 DIAGNOSIS — I129 Hypertensive chronic kidney disease with stage 1 through stage 4 chronic kidney disease, or unspecified chronic kidney disease: Secondary | ICD-10-CM | POA: Diagnosis not present

## 2021-08-18 DIAGNOSIS — F0283 Dementia in other diseases classified elsewhere, unspecified severity, with mood disturbance: Secondary | ICD-10-CM | POA: Diagnosis not present

## 2021-08-18 DIAGNOSIS — J189 Pneumonia, unspecified organism: Secondary | ICD-10-CM | POA: Diagnosis not present

## 2021-08-18 DIAGNOSIS — F0284 Dementia in other diseases classified elsewhere, unspecified severity, with anxiety: Secondary | ICD-10-CM | POA: Diagnosis not present

## 2021-08-18 DIAGNOSIS — G20 Parkinson's disease: Secondary | ICD-10-CM | POA: Diagnosis not present

## 2021-08-18 DIAGNOSIS — F329 Major depressive disorder, single episode, unspecified: Secondary | ICD-10-CM | POA: Diagnosis not present

## 2021-08-21 DIAGNOSIS — G20 Parkinson's disease: Secondary | ICD-10-CM | POA: Diagnosis not present

## 2021-08-21 DIAGNOSIS — F0284 Dementia in other diseases classified elsewhere, unspecified severity, with anxiety: Secondary | ICD-10-CM | POA: Diagnosis not present

## 2021-08-21 DIAGNOSIS — F0283 Dementia in other diseases classified elsewhere, unspecified severity, with mood disturbance: Secondary | ICD-10-CM | POA: Diagnosis not present

## 2021-08-21 DIAGNOSIS — F329 Major depressive disorder, single episode, unspecified: Secondary | ICD-10-CM | POA: Diagnosis not present

## 2021-08-21 DIAGNOSIS — I129 Hypertensive chronic kidney disease with stage 1 through stage 4 chronic kidney disease, or unspecified chronic kidney disease: Secondary | ICD-10-CM | POA: Diagnosis not present

## 2021-08-21 DIAGNOSIS — J189 Pneumonia, unspecified organism: Secondary | ICD-10-CM | POA: Diagnosis not present

## 2021-08-27 DIAGNOSIS — F329 Major depressive disorder, single episode, unspecified: Secondary | ICD-10-CM | POA: Diagnosis not present

## 2021-08-27 DIAGNOSIS — G20 Parkinson's disease: Secondary | ICD-10-CM | POA: Diagnosis not present

## 2021-08-27 DIAGNOSIS — I129 Hypertensive chronic kidney disease with stage 1 through stage 4 chronic kidney disease, or unspecified chronic kidney disease: Secondary | ICD-10-CM | POA: Diagnosis not present

## 2021-08-27 DIAGNOSIS — J189 Pneumonia, unspecified organism: Secondary | ICD-10-CM | POA: Diagnosis not present

## 2021-08-27 DIAGNOSIS — F0283 Dementia in other diseases classified elsewhere, unspecified severity, with mood disturbance: Secondary | ICD-10-CM | POA: Diagnosis not present

## 2021-08-27 DIAGNOSIS — F0284 Dementia in other diseases classified elsewhere, unspecified severity, with anxiety: Secondary | ICD-10-CM | POA: Diagnosis not present

## 2021-08-28 DIAGNOSIS — G20 Parkinson's disease: Secondary | ICD-10-CM | POA: Diagnosis not present

## 2021-08-28 DIAGNOSIS — J189 Pneumonia, unspecified organism: Secondary | ICD-10-CM | POA: Diagnosis not present

## 2021-08-28 DIAGNOSIS — F0284 Dementia in other diseases classified elsewhere, unspecified severity, with anxiety: Secondary | ICD-10-CM | POA: Diagnosis not present

## 2021-08-28 DIAGNOSIS — F0283 Dementia in other diseases classified elsewhere, unspecified severity, with mood disturbance: Secondary | ICD-10-CM | POA: Diagnosis not present

## 2021-08-28 DIAGNOSIS — F329 Major depressive disorder, single episode, unspecified: Secondary | ICD-10-CM | POA: Diagnosis not present

## 2021-08-28 DIAGNOSIS — I129 Hypertensive chronic kidney disease with stage 1 through stage 4 chronic kidney disease, or unspecified chronic kidney disease: Secondary | ICD-10-CM | POA: Diagnosis not present

## 2021-09-01 DIAGNOSIS — I129 Hypertensive chronic kidney disease with stage 1 through stage 4 chronic kidney disease, or unspecified chronic kidney disease: Secondary | ICD-10-CM | POA: Diagnosis not present

## 2021-09-01 DIAGNOSIS — G20 Parkinson's disease: Secondary | ICD-10-CM | POA: Diagnosis not present

## 2021-09-01 DIAGNOSIS — J189 Pneumonia, unspecified organism: Secondary | ICD-10-CM | POA: Diagnosis not present

## 2021-09-01 DIAGNOSIS — F0284 Dementia in other diseases classified elsewhere, unspecified severity, with anxiety: Secondary | ICD-10-CM | POA: Diagnosis not present

## 2021-09-01 DIAGNOSIS — F329 Major depressive disorder, single episode, unspecified: Secondary | ICD-10-CM | POA: Diagnosis not present

## 2021-09-01 DIAGNOSIS — F0283 Dementia in other diseases classified elsewhere, unspecified severity, with mood disturbance: Secondary | ICD-10-CM | POA: Diagnosis not present

## 2021-09-04 DIAGNOSIS — F329 Major depressive disorder, single episode, unspecified: Secondary | ICD-10-CM | POA: Diagnosis not present

## 2021-09-04 DIAGNOSIS — I129 Hypertensive chronic kidney disease with stage 1 through stage 4 chronic kidney disease, or unspecified chronic kidney disease: Secondary | ICD-10-CM | POA: Diagnosis not present

## 2021-09-04 DIAGNOSIS — F0284 Dementia in other diseases classified elsewhere, unspecified severity, with anxiety: Secondary | ICD-10-CM | POA: Diagnosis not present

## 2021-09-04 DIAGNOSIS — G20 Parkinson's disease: Secondary | ICD-10-CM | POA: Diagnosis not present

## 2021-09-04 DIAGNOSIS — J189 Pneumonia, unspecified organism: Secondary | ICD-10-CM | POA: Diagnosis not present

## 2021-09-04 DIAGNOSIS — F0283 Dementia in other diseases classified elsewhere, unspecified severity, with mood disturbance: Secondary | ICD-10-CM | POA: Diagnosis not present

## 2021-09-05 DIAGNOSIS — F0284 Dementia in other diseases classified elsewhere, unspecified severity, with anxiety: Secondary | ICD-10-CM | POA: Diagnosis not present

## 2021-09-05 DIAGNOSIS — G20 Parkinson's disease: Secondary | ICD-10-CM | POA: Diagnosis not present

## 2021-09-05 DIAGNOSIS — J189 Pneumonia, unspecified organism: Secondary | ICD-10-CM | POA: Diagnosis not present

## 2021-09-05 DIAGNOSIS — I129 Hypertensive chronic kidney disease with stage 1 through stage 4 chronic kidney disease, or unspecified chronic kidney disease: Secondary | ICD-10-CM | POA: Diagnosis not present

## 2021-09-05 DIAGNOSIS — F329 Major depressive disorder, single episode, unspecified: Secondary | ICD-10-CM | POA: Diagnosis not present

## 2021-09-05 DIAGNOSIS — F0283 Dementia in other diseases classified elsewhere, unspecified severity, with mood disturbance: Secondary | ICD-10-CM | POA: Diagnosis not present

## 2021-09-08 DIAGNOSIS — J189 Pneumonia, unspecified organism: Secondary | ICD-10-CM | POA: Diagnosis not present

## 2021-09-08 DIAGNOSIS — F0283 Dementia in other diseases classified elsewhere, unspecified severity, with mood disturbance: Secondary | ICD-10-CM | POA: Diagnosis not present

## 2021-09-08 DIAGNOSIS — F0284 Dementia in other diseases classified elsewhere, unspecified severity, with anxiety: Secondary | ICD-10-CM | POA: Diagnosis not present

## 2021-09-08 DIAGNOSIS — I129 Hypertensive chronic kidney disease with stage 1 through stage 4 chronic kidney disease, or unspecified chronic kidney disease: Secondary | ICD-10-CM | POA: Diagnosis not present

## 2021-09-08 DIAGNOSIS — G20 Parkinson's disease: Secondary | ICD-10-CM | POA: Diagnosis not present

## 2021-09-08 DIAGNOSIS — F329 Major depressive disorder, single episode, unspecified: Secondary | ICD-10-CM | POA: Diagnosis not present

## 2021-09-10 DIAGNOSIS — F0283 Dementia in other diseases classified elsewhere, unspecified severity, with mood disturbance: Secondary | ICD-10-CM | POA: Diagnosis not present

## 2021-09-10 DIAGNOSIS — J189 Pneumonia, unspecified organism: Secondary | ICD-10-CM | POA: Diagnosis not present

## 2021-09-10 DIAGNOSIS — I129 Hypertensive chronic kidney disease with stage 1 through stage 4 chronic kidney disease, or unspecified chronic kidney disease: Secondary | ICD-10-CM | POA: Diagnosis not present

## 2021-09-10 DIAGNOSIS — F0284 Dementia in other diseases classified elsewhere, unspecified severity, with anxiety: Secondary | ICD-10-CM | POA: Diagnosis not present

## 2021-09-10 DIAGNOSIS — G20 Parkinson's disease: Secondary | ICD-10-CM | POA: Diagnosis not present

## 2021-09-10 DIAGNOSIS — F329 Major depressive disorder, single episode, unspecified: Secondary | ICD-10-CM | POA: Diagnosis not present

## 2021-09-11 DIAGNOSIS — F0283 Dementia in other diseases classified elsewhere, unspecified severity, with mood disturbance: Secondary | ICD-10-CM | POA: Diagnosis not present

## 2021-09-11 DIAGNOSIS — I350 Nonrheumatic aortic (valve) stenosis: Secondary | ICD-10-CM | POA: Diagnosis not present

## 2021-09-11 DIAGNOSIS — H9193 Unspecified hearing loss, bilateral: Secondary | ICD-10-CM | POA: Diagnosis not present

## 2021-09-11 DIAGNOSIS — E1122 Type 2 diabetes mellitus with diabetic chronic kidney disease: Secondary | ICD-10-CM | POA: Diagnosis not present

## 2021-09-11 DIAGNOSIS — D631 Anemia in chronic kidney disease: Secondary | ICD-10-CM | POA: Diagnosis not present

## 2021-09-11 DIAGNOSIS — Z853 Personal history of malignant neoplasm of breast: Secondary | ICD-10-CM | POA: Diagnosis not present

## 2021-09-11 DIAGNOSIS — F0284 Dementia in other diseases classified elsewhere, unspecified severity, with anxiety: Secondary | ICD-10-CM | POA: Diagnosis not present

## 2021-09-11 DIAGNOSIS — N1832 Chronic kidney disease, stage 3b: Secondary | ICD-10-CM | POA: Diagnosis not present

## 2021-09-11 DIAGNOSIS — G20 Parkinson's disease: Secondary | ICD-10-CM | POA: Diagnosis not present

## 2021-09-11 DIAGNOSIS — Z8701 Personal history of pneumonia (recurrent): Secondary | ICD-10-CM | POA: Diagnosis not present

## 2021-09-11 DIAGNOSIS — I129 Hypertensive chronic kidney disease with stage 1 through stage 4 chronic kidney disease, or unspecified chronic kidney disease: Secondary | ICD-10-CM | POA: Diagnosis not present

## 2021-09-11 DIAGNOSIS — Z8572 Personal history of non-Hodgkin lymphomas: Secondary | ICD-10-CM | POA: Diagnosis not present

## 2021-09-11 DIAGNOSIS — G9341 Metabolic encephalopathy: Secondary | ICD-10-CM | POA: Diagnosis not present

## 2021-09-11 DIAGNOSIS — K219 Gastro-esophageal reflux disease without esophagitis: Secondary | ICD-10-CM | POA: Diagnosis not present

## 2021-09-11 DIAGNOSIS — J4520 Mild intermittent asthma, uncomplicated: Secondary | ICD-10-CM | POA: Diagnosis not present

## 2021-09-11 DIAGNOSIS — F329 Major depressive disorder, single episode, unspecified: Secondary | ICD-10-CM | POA: Diagnosis not present

## 2021-09-11 DIAGNOSIS — R1310 Dysphagia, unspecified: Secondary | ICD-10-CM | POA: Diagnosis not present

## 2021-09-11 DIAGNOSIS — Z9013 Acquired absence of bilateral breasts and nipples: Secondary | ICD-10-CM | POA: Diagnosis not present

## 2021-09-11 DIAGNOSIS — R339 Retention of urine, unspecified: Secondary | ICD-10-CM | POA: Diagnosis not present

## 2021-09-15 DIAGNOSIS — G20 Parkinson's disease: Secondary | ICD-10-CM | POA: Diagnosis not present

## 2021-09-15 DIAGNOSIS — E1122 Type 2 diabetes mellitus with diabetic chronic kidney disease: Secondary | ICD-10-CM | POA: Diagnosis not present

## 2021-09-15 DIAGNOSIS — F0283 Dementia in other diseases classified elsewhere, unspecified severity, with mood disturbance: Secondary | ICD-10-CM | POA: Diagnosis not present

## 2021-09-15 DIAGNOSIS — F0284 Dementia in other diseases classified elsewhere, unspecified severity, with anxiety: Secondary | ICD-10-CM | POA: Diagnosis not present

## 2021-09-15 DIAGNOSIS — F329 Major depressive disorder, single episode, unspecified: Secondary | ICD-10-CM | POA: Diagnosis not present

## 2021-09-15 DIAGNOSIS — I129 Hypertensive chronic kidney disease with stage 1 through stage 4 chronic kidney disease, or unspecified chronic kidney disease: Secondary | ICD-10-CM | POA: Diagnosis not present

## 2021-09-17 DIAGNOSIS — F329 Major depressive disorder, single episode, unspecified: Secondary | ICD-10-CM | POA: Diagnosis not present

## 2021-09-17 DIAGNOSIS — I129 Hypertensive chronic kidney disease with stage 1 through stage 4 chronic kidney disease, or unspecified chronic kidney disease: Secondary | ICD-10-CM | POA: Diagnosis not present

## 2021-09-17 DIAGNOSIS — F0283 Dementia in other diseases classified elsewhere, unspecified severity, with mood disturbance: Secondary | ICD-10-CM | POA: Diagnosis not present

## 2021-09-17 DIAGNOSIS — G20 Parkinson's disease: Secondary | ICD-10-CM | POA: Diagnosis not present

## 2021-09-17 DIAGNOSIS — E1122 Type 2 diabetes mellitus with diabetic chronic kidney disease: Secondary | ICD-10-CM | POA: Diagnosis not present

## 2021-09-17 DIAGNOSIS — F0284 Dementia in other diseases classified elsewhere, unspecified severity, with anxiety: Secondary | ICD-10-CM | POA: Diagnosis not present

## 2021-09-18 DIAGNOSIS — F0283 Dementia in other diseases classified elsewhere, unspecified severity, with mood disturbance: Secondary | ICD-10-CM | POA: Diagnosis not present

## 2021-09-18 DIAGNOSIS — G20 Parkinson's disease: Secondary | ICD-10-CM | POA: Diagnosis not present

## 2021-09-18 DIAGNOSIS — E1122 Type 2 diabetes mellitus with diabetic chronic kidney disease: Secondary | ICD-10-CM | POA: Diagnosis not present

## 2021-09-18 DIAGNOSIS — F329 Major depressive disorder, single episode, unspecified: Secondary | ICD-10-CM | POA: Diagnosis not present

## 2021-09-18 DIAGNOSIS — I129 Hypertensive chronic kidney disease with stage 1 through stage 4 chronic kidney disease, or unspecified chronic kidney disease: Secondary | ICD-10-CM | POA: Diagnosis not present

## 2021-09-18 DIAGNOSIS — F0284 Dementia in other diseases classified elsewhere, unspecified severity, with anxiety: Secondary | ICD-10-CM | POA: Diagnosis not present

## 2021-09-22 DIAGNOSIS — F0284 Dementia in other diseases classified elsewhere, unspecified severity, with anxiety: Secondary | ICD-10-CM | POA: Diagnosis not present

## 2021-09-22 DIAGNOSIS — F0283 Dementia in other diseases classified elsewhere, unspecified severity, with mood disturbance: Secondary | ICD-10-CM | POA: Diagnosis not present

## 2021-09-22 DIAGNOSIS — E1122 Type 2 diabetes mellitus with diabetic chronic kidney disease: Secondary | ICD-10-CM | POA: Diagnosis not present

## 2021-09-22 DIAGNOSIS — G20 Parkinson's disease: Secondary | ICD-10-CM | POA: Diagnosis not present

## 2021-09-22 DIAGNOSIS — F329 Major depressive disorder, single episode, unspecified: Secondary | ICD-10-CM | POA: Diagnosis not present

## 2021-09-22 DIAGNOSIS — I129 Hypertensive chronic kidney disease with stage 1 through stage 4 chronic kidney disease, or unspecified chronic kidney disease: Secondary | ICD-10-CM | POA: Diagnosis not present

## 2021-09-24 DIAGNOSIS — I129 Hypertensive chronic kidney disease with stage 1 through stage 4 chronic kidney disease, or unspecified chronic kidney disease: Secondary | ICD-10-CM | POA: Diagnosis not present

## 2021-09-24 DIAGNOSIS — L989 Disorder of the skin and subcutaneous tissue, unspecified: Secondary | ICD-10-CM | POA: Diagnosis not present

## 2021-09-24 DIAGNOSIS — F329 Major depressive disorder, single episode, unspecified: Secondary | ICD-10-CM | POA: Diagnosis not present

## 2021-09-24 DIAGNOSIS — E1122 Type 2 diabetes mellitus with diabetic chronic kidney disease: Secondary | ICD-10-CM | POA: Diagnosis not present

## 2021-09-24 DIAGNOSIS — G20 Parkinson's disease: Secondary | ICD-10-CM | POA: Diagnosis not present

## 2021-09-24 DIAGNOSIS — F0284 Dementia in other diseases classified elsewhere, unspecified severity, with anxiety: Secondary | ICD-10-CM | POA: Diagnosis not present

## 2021-09-24 DIAGNOSIS — F0283 Dementia in other diseases classified elsewhere, unspecified severity, with mood disturbance: Secondary | ICD-10-CM | POA: Diagnosis not present

## 2021-09-25 DIAGNOSIS — F0284 Dementia in other diseases classified elsewhere, unspecified severity, with anxiety: Secondary | ICD-10-CM | POA: Diagnosis not present

## 2021-09-25 DIAGNOSIS — F0283 Dementia in other diseases classified elsewhere, unspecified severity, with mood disturbance: Secondary | ICD-10-CM | POA: Diagnosis not present

## 2021-09-25 DIAGNOSIS — F329 Major depressive disorder, single episode, unspecified: Secondary | ICD-10-CM | POA: Diagnosis not present

## 2021-09-25 DIAGNOSIS — E1122 Type 2 diabetes mellitus with diabetic chronic kidney disease: Secondary | ICD-10-CM | POA: Diagnosis not present

## 2021-09-25 DIAGNOSIS — G20 Parkinson's disease: Secondary | ICD-10-CM | POA: Diagnosis not present

## 2021-09-25 DIAGNOSIS — I129 Hypertensive chronic kidney disease with stage 1 through stage 4 chronic kidney disease, or unspecified chronic kidney disease: Secondary | ICD-10-CM | POA: Diagnosis not present

## 2021-09-29 DIAGNOSIS — F0283 Dementia in other diseases classified elsewhere, unspecified severity, with mood disturbance: Secondary | ICD-10-CM | POA: Diagnosis not present

## 2021-09-29 DIAGNOSIS — I129 Hypertensive chronic kidney disease with stage 1 through stage 4 chronic kidney disease, or unspecified chronic kidney disease: Secondary | ICD-10-CM | POA: Diagnosis not present

## 2021-09-29 DIAGNOSIS — F329 Major depressive disorder, single episode, unspecified: Secondary | ICD-10-CM | POA: Diagnosis not present

## 2021-09-29 DIAGNOSIS — F0284 Dementia in other diseases classified elsewhere, unspecified severity, with anxiety: Secondary | ICD-10-CM | POA: Diagnosis not present

## 2021-09-29 DIAGNOSIS — G20 Parkinson's disease: Secondary | ICD-10-CM | POA: Diagnosis not present

## 2021-09-29 DIAGNOSIS — E1122 Type 2 diabetes mellitus with diabetic chronic kidney disease: Secondary | ICD-10-CM | POA: Diagnosis not present

## 2021-10-03 DIAGNOSIS — E1122 Type 2 diabetes mellitus with diabetic chronic kidney disease: Secondary | ICD-10-CM | POA: Diagnosis not present

## 2021-10-03 DIAGNOSIS — F329 Major depressive disorder, single episode, unspecified: Secondary | ICD-10-CM | POA: Diagnosis not present

## 2021-10-03 DIAGNOSIS — F0284 Dementia in other diseases classified elsewhere, unspecified severity, with anxiety: Secondary | ICD-10-CM | POA: Diagnosis not present

## 2021-10-03 DIAGNOSIS — I129 Hypertensive chronic kidney disease with stage 1 through stage 4 chronic kidney disease, or unspecified chronic kidney disease: Secondary | ICD-10-CM | POA: Diagnosis not present

## 2021-10-03 DIAGNOSIS — F0283 Dementia in other diseases classified elsewhere, unspecified severity, with mood disturbance: Secondary | ICD-10-CM | POA: Diagnosis not present

## 2021-10-03 DIAGNOSIS — G20 Parkinson's disease: Secondary | ICD-10-CM | POA: Diagnosis not present

## 2021-10-06 DIAGNOSIS — F0283 Dementia in other diseases classified elsewhere, unspecified severity, with mood disturbance: Secondary | ICD-10-CM | POA: Diagnosis not present

## 2021-10-06 DIAGNOSIS — I129 Hypertensive chronic kidney disease with stage 1 through stage 4 chronic kidney disease, or unspecified chronic kidney disease: Secondary | ICD-10-CM | POA: Diagnosis not present

## 2021-10-06 DIAGNOSIS — F0284 Dementia in other diseases classified elsewhere, unspecified severity, with anxiety: Secondary | ICD-10-CM | POA: Diagnosis not present

## 2021-10-06 DIAGNOSIS — E1122 Type 2 diabetes mellitus with diabetic chronic kidney disease: Secondary | ICD-10-CM | POA: Diagnosis not present

## 2021-10-06 DIAGNOSIS — F329 Major depressive disorder, single episode, unspecified: Secondary | ICD-10-CM | POA: Diagnosis not present

## 2021-10-06 DIAGNOSIS — G20 Parkinson's disease: Secondary | ICD-10-CM | POA: Diagnosis not present

## 2021-10-07 DIAGNOSIS — C44309 Unspecified malignant neoplasm of skin of other parts of face: Secondary | ICD-10-CM | POA: Diagnosis not present

## 2021-10-07 DIAGNOSIS — D485 Neoplasm of uncertain behavior of skin: Secondary | ICD-10-CM | POA: Diagnosis not present

## 2021-10-07 DIAGNOSIS — Z23 Encounter for immunization: Secondary | ICD-10-CM | POA: Diagnosis not present

## 2021-10-09 DIAGNOSIS — F329 Major depressive disorder, single episode, unspecified: Secondary | ICD-10-CM | POA: Diagnosis not present

## 2021-10-09 DIAGNOSIS — F0283 Dementia in other diseases classified elsewhere, unspecified severity, with mood disturbance: Secondary | ICD-10-CM | POA: Diagnosis not present

## 2021-10-09 DIAGNOSIS — F0284 Dementia in other diseases classified elsewhere, unspecified severity, with anxiety: Secondary | ICD-10-CM | POA: Diagnosis not present

## 2021-10-09 DIAGNOSIS — I129 Hypertensive chronic kidney disease with stage 1 through stage 4 chronic kidney disease, or unspecified chronic kidney disease: Secondary | ICD-10-CM | POA: Diagnosis not present

## 2021-10-09 DIAGNOSIS — G20 Parkinson's disease: Secondary | ICD-10-CM | POA: Diagnosis not present

## 2021-10-09 DIAGNOSIS — E1122 Type 2 diabetes mellitus with diabetic chronic kidney disease: Secondary | ICD-10-CM | POA: Diagnosis not present

## 2021-10-11 DIAGNOSIS — I129 Hypertensive chronic kidney disease with stage 1 through stage 4 chronic kidney disease, or unspecified chronic kidney disease: Secondary | ICD-10-CM | POA: Diagnosis not present

## 2021-10-11 DIAGNOSIS — F0284 Dementia in other diseases classified elsewhere, unspecified severity, with anxiety: Secondary | ICD-10-CM | POA: Diagnosis not present

## 2021-10-11 DIAGNOSIS — Z853 Personal history of malignant neoplasm of breast: Secondary | ICD-10-CM | POA: Diagnosis not present

## 2021-10-11 DIAGNOSIS — Z9013 Acquired absence of bilateral breasts and nipples: Secondary | ICD-10-CM | POA: Diagnosis not present

## 2021-10-11 DIAGNOSIS — F0283 Dementia in other diseases classified elsewhere, unspecified severity, with mood disturbance: Secondary | ICD-10-CM | POA: Diagnosis not present

## 2021-10-11 DIAGNOSIS — D631 Anemia in chronic kidney disease: Secondary | ICD-10-CM | POA: Diagnosis not present

## 2021-10-11 DIAGNOSIS — E1122 Type 2 diabetes mellitus with diabetic chronic kidney disease: Secondary | ICD-10-CM | POA: Diagnosis not present

## 2021-10-11 DIAGNOSIS — G20 Parkinson's disease: Secondary | ICD-10-CM | POA: Diagnosis not present

## 2021-10-11 DIAGNOSIS — J4520 Mild intermittent asthma, uncomplicated: Secondary | ICD-10-CM | POA: Diagnosis not present

## 2021-10-11 DIAGNOSIS — H9193 Unspecified hearing loss, bilateral: Secondary | ICD-10-CM | POA: Diagnosis not present

## 2021-10-11 DIAGNOSIS — Z8572 Personal history of non-Hodgkin lymphomas: Secondary | ICD-10-CM | POA: Diagnosis not present

## 2021-10-11 DIAGNOSIS — R339 Retention of urine, unspecified: Secondary | ICD-10-CM | POA: Diagnosis not present

## 2021-10-11 DIAGNOSIS — F329 Major depressive disorder, single episode, unspecified: Secondary | ICD-10-CM | POA: Diagnosis not present

## 2021-10-11 DIAGNOSIS — R1310 Dysphagia, unspecified: Secondary | ICD-10-CM | POA: Diagnosis not present

## 2021-10-11 DIAGNOSIS — K219 Gastro-esophageal reflux disease without esophagitis: Secondary | ICD-10-CM | POA: Diagnosis not present

## 2021-10-11 DIAGNOSIS — I350 Nonrheumatic aortic (valve) stenosis: Secondary | ICD-10-CM | POA: Diagnosis not present

## 2021-10-11 DIAGNOSIS — G9341 Metabolic encephalopathy: Secondary | ICD-10-CM | POA: Diagnosis not present

## 2021-10-11 DIAGNOSIS — Z8701 Personal history of pneumonia (recurrent): Secondary | ICD-10-CM | POA: Diagnosis not present

## 2021-10-11 DIAGNOSIS — N1832 Chronic kidney disease, stage 3b: Secondary | ICD-10-CM | POA: Diagnosis not present

## 2021-10-13 DIAGNOSIS — F0284 Dementia in other diseases classified elsewhere, unspecified severity, with anxiety: Secondary | ICD-10-CM | POA: Diagnosis not present

## 2021-10-13 DIAGNOSIS — I129 Hypertensive chronic kidney disease with stage 1 through stage 4 chronic kidney disease, or unspecified chronic kidney disease: Secondary | ICD-10-CM | POA: Diagnosis not present

## 2021-10-13 DIAGNOSIS — F0283 Dementia in other diseases classified elsewhere, unspecified severity, with mood disturbance: Secondary | ICD-10-CM | POA: Diagnosis not present

## 2021-10-13 DIAGNOSIS — F329 Major depressive disorder, single episode, unspecified: Secondary | ICD-10-CM | POA: Diagnosis not present

## 2021-10-13 DIAGNOSIS — G20 Parkinson's disease: Secondary | ICD-10-CM | POA: Diagnosis not present

## 2021-10-13 DIAGNOSIS — E1122 Type 2 diabetes mellitus with diabetic chronic kidney disease: Secondary | ICD-10-CM | POA: Diagnosis not present

## 2021-10-16 DIAGNOSIS — I129 Hypertensive chronic kidney disease with stage 1 through stage 4 chronic kidney disease, or unspecified chronic kidney disease: Secondary | ICD-10-CM | POA: Diagnosis not present

## 2021-10-16 DIAGNOSIS — E1122 Type 2 diabetes mellitus with diabetic chronic kidney disease: Secondary | ICD-10-CM | POA: Diagnosis not present

## 2021-10-16 DIAGNOSIS — F329 Major depressive disorder, single episode, unspecified: Secondary | ICD-10-CM | POA: Diagnosis not present

## 2021-10-16 DIAGNOSIS — F0284 Dementia in other diseases classified elsewhere, unspecified severity, with anxiety: Secondary | ICD-10-CM | POA: Diagnosis not present

## 2021-10-16 DIAGNOSIS — G20 Parkinson's disease: Secondary | ICD-10-CM | POA: Diagnosis not present

## 2021-10-16 DIAGNOSIS — F0283 Dementia in other diseases classified elsewhere, unspecified severity, with mood disturbance: Secondary | ICD-10-CM | POA: Diagnosis not present

## 2021-10-21 DIAGNOSIS — I129 Hypertensive chronic kidney disease with stage 1 through stage 4 chronic kidney disease, or unspecified chronic kidney disease: Secondary | ICD-10-CM | POA: Diagnosis not present

## 2021-10-21 DIAGNOSIS — F329 Major depressive disorder, single episode, unspecified: Secondary | ICD-10-CM | POA: Diagnosis not present

## 2021-10-21 DIAGNOSIS — E1122 Type 2 diabetes mellitus with diabetic chronic kidney disease: Secondary | ICD-10-CM | POA: Diagnosis not present

## 2021-10-21 DIAGNOSIS — F0283 Dementia in other diseases classified elsewhere, unspecified severity, with mood disturbance: Secondary | ICD-10-CM | POA: Diagnosis not present

## 2021-10-21 DIAGNOSIS — F0284 Dementia in other diseases classified elsewhere, unspecified severity, with anxiety: Secondary | ICD-10-CM | POA: Diagnosis not present

## 2021-10-21 DIAGNOSIS — G20 Parkinson's disease: Secondary | ICD-10-CM | POA: Diagnosis not present

## 2021-10-23 DIAGNOSIS — F329 Major depressive disorder, single episode, unspecified: Secondary | ICD-10-CM | POA: Diagnosis not present

## 2021-10-23 DIAGNOSIS — E1122 Type 2 diabetes mellitus with diabetic chronic kidney disease: Secondary | ICD-10-CM | POA: Diagnosis not present

## 2021-10-23 DIAGNOSIS — F0283 Dementia in other diseases classified elsewhere, unspecified severity, with mood disturbance: Secondary | ICD-10-CM | POA: Diagnosis not present

## 2021-10-23 DIAGNOSIS — F0284 Dementia in other diseases classified elsewhere, unspecified severity, with anxiety: Secondary | ICD-10-CM | POA: Diagnosis not present

## 2021-10-23 DIAGNOSIS — I129 Hypertensive chronic kidney disease with stage 1 through stage 4 chronic kidney disease, or unspecified chronic kidney disease: Secondary | ICD-10-CM | POA: Diagnosis not present

## 2021-10-23 DIAGNOSIS — G20 Parkinson's disease: Secondary | ICD-10-CM | POA: Diagnosis not present

## 2021-10-27 DIAGNOSIS — I129 Hypertensive chronic kidney disease with stage 1 through stage 4 chronic kidney disease, or unspecified chronic kidney disease: Secondary | ICD-10-CM | POA: Diagnosis not present

## 2021-10-27 DIAGNOSIS — D631 Anemia in chronic kidney disease: Secondary | ICD-10-CM | POA: Diagnosis not present

## 2021-10-27 DIAGNOSIS — C4490 Unspecified malignant neoplasm of skin, unspecified: Secondary | ICD-10-CM | POA: Diagnosis not present

## 2021-10-27 DIAGNOSIS — F329 Major depressive disorder, single episode, unspecified: Secondary | ICD-10-CM | POA: Diagnosis not present

## 2021-10-27 DIAGNOSIS — G20 Parkinson's disease: Secondary | ICD-10-CM | POA: Diagnosis not present

## 2021-10-28 DIAGNOSIS — I129 Hypertensive chronic kidney disease with stage 1 through stage 4 chronic kidney disease, or unspecified chronic kidney disease: Secondary | ICD-10-CM | POA: Diagnosis not present

## 2021-10-28 DIAGNOSIS — F329 Major depressive disorder, single episode, unspecified: Secondary | ICD-10-CM | POA: Diagnosis not present

## 2021-10-28 DIAGNOSIS — F0283 Dementia in other diseases classified elsewhere, unspecified severity, with mood disturbance: Secondary | ICD-10-CM | POA: Diagnosis not present

## 2021-10-28 DIAGNOSIS — E1122 Type 2 diabetes mellitus with diabetic chronic kidney disease: Secondary | ICD-10-CM | POA: Diagnosis not present

## 2021-10-28 DIAGNOSIS — F0284 Dementia in other diseases classified elsewhere, unspecified severity, with anxiety: Secondary | ICD-10-CM | POA: Diagnosis not present

## 2021-10-28 DIAGNOSIS — G20 Parkinson's disease: Secondary | ICD-10-CM | POA: Diagnosis not present

## 2021-10-30 DIAGNOSIS — G20 Parkinson's disease: Secondary | ICD-10-CM | POA: Diagnosis not present

## 2021-10-30 DIAGNOSIS — F0283 Dementia in other diseases classified elsewhere, unspecified severity, with mood disturbance: Secondary | ICD-10-CM | POA: Diagnosis not present

## 2021-10-30 DIAGNOSIS — F329 Major depressive disorder, single episode, unspecified: Secondary | ICD-10-CM | POA: Diagnosis not present

## 2021-10-30 DIAGNOSIS — E1122 Type 2 diabetes mellitus with diabetic chronic kidney disease: Secondary | ICD-10-CM | POA: Diagnosis not present

## 2021-10-30 DIAGNOSIS — F0284 Dementia in other diseases classified elsewhere, unspecified severity, with anxiety: Secondary | ICD-10-CM | POA: Diagnosis not present

## 2021-10-30 DIAGNOSIS — I129 Hypertensive chronic kidney disease with stage 1 through stage 4 chronic kidney disease, or unspecified chronic kidney disease: Secondary | ICD-10-CM | POA: Diagnosis not present

## 2021-11-05 DIAGNOSIS — F329 Major depressive disorder, single episode, unspecified: Secondary | ICD-10-CM | POA: Diagnosis not present

## 2021-11-05 DIAGNOSIS — I129 Hypertensive chronic kidney disease with stage 1 through stage 4 chronic kidney disease, or unspecified chronic kidney disease: Secondary | ICD-10-CM | POA: Diagnosis not present

## 2021-11-05 DIAGNOSIS — E1122 Type 2 diabetes mellitus with diabetic chronic kidney disease: Secondary | ICD-10-CM | POA: Diagnosis not present

## 2021-11-05 DIAGNOSIS — F0283 Dementia in other diseases classified elsewhere, unspecified severity, with mood disturbance: Secondary | ICD-10-CM | POA: Diagnosis not present

## 2021-11-05 DIAGNOSIS — G20 Parkinson's disease: Secondary | ICD-10-CM | POA: Diagnosis not present

## 2021-11-05 DIAGNOSIS — F0284 Dementia in other diseases classified elsewhere, unspecified severity, with anxiety: Secondary | ICD-10-CM | POA: Diagnosis not present

## 2021-11-06 DIAGNOSIS — G20 Parkinson's disease: Secondary | ICD-10-CM | POA: Diagnosis not present

## 2021-11-06 DIAGNOSIS — F329 Major depressive disorder, single episode, unspecified: Secondary | ICD-10-CM | POA: Diagnosis not present

## 2021-11-06 DIAGNOSIS — F0283 Dementia in other diseases classified elsewhere, unspecified severity, with mood disturbance: Secondary | ICD-10-CM | POA: Diagnosis not present

## 2021-11-06 DIAGNOSIS — F0284 Dementia in other diseases classified elsewhere, unspecified severity, with anxiety: Secondary | ICD-10-CM | POA: Diagnosis not present

## 2021-11-06 DIAGNOSIS — I129 Hypertensive chronic kidney disease with stage 1 through stage 4 chronic kidney disease, or unspecified chronic kidney disease: Secondary | ICD-10-CM | POA: Diagnosis not present

## 2021-11-06 DIAGNOSIS — E1122 Type 2 diabetes mellitus with diabetic chronic kidney disease: Secondary | ICD-10-CM | POA: Diagnosis not present

## 2021-11-10 DIAGNOSIS — R1310 Dysphagia, unspecified: Secondary | ICD-10-CM | POA: Diagnosis not present

## 2021-11-10 DIAGNOSIS — H9193 Unspecified hearing loss, bilateral: Secondary | ICD-10-CM | POA: Diagnosis not present

## 2021-11-10 DIAGNOSIS — G9341 Metabolic encephalopathy: Secondary | ICD-10-CM | POA: Diagnosis not present

## 2021-11-10 DIAGNOSIS — J4520 Mild intermittent asthma, uncomplicated: Secondary | ICD-10-CM | POA: Diagnosis not present

## 2021-11-10 DIAGNOSIS — I129 Hypertensive chronic kidney disease with stage 1 through stage 4 chronic kidney disease, or unspecified chronic kidney disease: Secondary | ICD-10-CM | POA: Diagnosis not present

## 2021-11-10 DIAGNOSIS — Z8572 Personal history of non-Hodgkin lymphomas: Secondary | ICD-10-CM | POA: Diagnosis not present

## 2021-11-10 DIAGNOSIS — K219 Gastro-esophageal reflux disease without esophagitis: Secondary | ICD-10-CM | POA: Diagnosis not present

## 2021-11-10 DIAGNOSIS — Z853 Personal history of malignant neoplasm of breast: Secondary | ICD-10-CM | POA: Diagnosis not present

## 2021-11-10 DIAGNOSIS — F0283 Dementia in other diseases classified elsewhere, unspecified severity, with mood disturbance: Secondary | ICD-10-CM | POA: Diagnosis not present

## 2021-11-10 DIAGNOSIS — D631 Anemia in chronic kidney disease: Secondary | ICD-10-CM | POA: Diagnosis not present

## 2021-11-10 DIAGNOSIS — F0284 Dementia in other diseases classified elsewhere, unspecified severity, with anxiety: Secondary | ICD-10-CM | POA: Diagnosis not present

## 2021-11-10 DIAGNOSIS — I350 Nonrheumatic aortic (valve) stenosis: Secondary | ICD-10-CM | POA: Diagnosis not present

## 2021-11-10 DIAGNOSIS — E1122 Type 2 diabetes mellitus with diabetic chronic kidney disease: Secondary | ICD-10-CM | POA: Diagnosis not present

## 2021-11-10 DIAGNOSIS — Z8701 Personal history of pneumonia (recurrent): Secondary | ICD-10-CM | POA: Diagnosis not present

## 2021-11-10 DIAGNOSIS — N1832 Chronic kidney disease, stage 3b: Secondary | ICD-10-CM | POA: Diagnosis not present

## 2021-11-10 DIAGNOSIS — F329 Major depressive disorder, single episode, unspecified: Secondary | ICD-10-CM | POA: Diagnosis not present

## 2021-11-10 DIAGNOSIS — Z9013 Acquired absence of bilateral breasts and nipples: Secondary | ICD-10-CM | POA: Diagnosis not present

## 2021-11-10 DIAGNOSIS — R339 Retention of urine, unspecified: Secondary | ICD-10-CM | POA: Diagnosis not present

## 2021-11-10 DIAGNOSIS — G20 Parkinson's disease: Secondary | ICD-10-CM | POA: Diagnosis not present

## 2021-11-13 DIAGNOSIS — F0284 Dementia in other diseases classified elsewhere, unspecified severity, with anxiety: Secondary | ICD-10-CM | POA: Diagnosis not present

## 2021-11-13 DIAGNOSIS — G20 Parkinson's disease: Secondary | ICD-10-CM | POA: Diagnosis not present

## 2021-11-13 DIAGNOSIS — E1122 Type 2 diabetes mellitus with diabetic chronic kidney disease: Secondary | ICD-10-CM | POA: Diagnosis not present

## 2021-11-13 DIAGNOSIS — F329 Major depressive disorder, single episode, unspecified: Secondary | ICD-10-CM | POA: Diagnosis not present

## 2021-11-13 DIAGNOSIS — I129 Hypertensive chronic kidney disease with stage 1 through stage 4 chronic kidney disease, or unspecified chronic kidney disease: Secondary | ICD-10-CM | POA: Diagnosis not present

## 2021-11-13 DIAGNOSIS — F0283 Dementia in other diseases classified elsewhere, unspecified severity, with mood disturbance: Secondary | ICD-10-CM | POA: Diagnosis not present

## 2021-11-18 ENCOUNTER — Other Ambulatory Visit: Payer: Self-pay

## 2021-11-18 ENCOUNTER — Emergency Department (HOSPITAL_BASED_OUTPATIENT_CLINIC_OR_DEPARTMENT_OTHER)
Admission: EM | Admit: 2021-11-18 | Discharge: 2021-11-18 | Disposition: A | Discharge status: Home / Self Care | Payer: Medicare Other | Source: Home / Self Care | Attending: Emergency Medicine | Admitting: Emergency Medicine

## 2021-11-18 ENCOUNTER — Emergency Department (HOSPITAL_BASED_OUTPATIENT_CLINIC_OR_DEPARTMENT_OTHER): Payer: Medicare Other

## 2021-11-18 ENCOUNTER — Encounter (HOSPITAL_BASED_OUTPATIENT_CLINIC_OR_DEPARTMENT_OTHER): Payer: Self-pay

## 2021-11-18 DIAGNOSIS — F419 Anxiety disorder, unspecified: Secondary | ICD-10-CM | POA: Diagnosis not present

## 2021-11-18 DIAGNOSIS — F0283 Dementia in other diseases classified elsewhere, unspecified severity, with mood disturbance: Secondary | ICD-10-CM | POA: Diagnosis present

## 2021-11-18 DIAGNOSIS — Z20822 Contact with and (suspected) exposure to covid-19: Secondary | ICD-10-CM | POA: Insufficient documentation

## 2021-11-18 DIAGNOSIS — D500 Iron deficiency anemia secondary to blood loss (chronic): Secondary | ICD-10-CM | POA: Diagnosis present

## 2021-11-18 DIAGNOSIS — J181 Lobar pneumonia, unspecified organism: Secondary | ICD-10-CM | POA: Insufficient documentation

## 2021-11-18 DIAGNOSIS — Z66 Do not resuscitate: Secondary | ICD-10-CM | POA: Diagnosis not present

## 2021-11-18 DIAGNOSIS — J4520 Mild intermittent asthma, uncomplicated: Secondary | ICD-10-CM | POA: Diagnosis present

## 2021-11-18 DIAGNOSIS — G20 Parkinson's disease: Secondary | ICD-10-CM | POA: Diagnosis not present

## 2021-11-18 DIAGNOSIS — R54 Age-related physical debility: Secondary | ICD-10-CM | POA: Diagnosis present

## 2021-11-18 DIAGNOSIS — R64 Cachexia: Secondary | ICD-10-CM | POA: Diagnosis present

## 2021-11-18 DIAGNOSIS — F32A Depression, unspecified: Secondary | ICD-10-CM | POA: Diagnosis not present

## 2021-11-18 DIAGNOSIS — R0902 Hypoxemia: Secondary | ICD-10-CM | POA: Diagnosis not present

## 2021-11-18 DIAGNOSIS — I160 Hypertensive urgency: Secondary | ICD-10-CM | POA: Diagnosis not present

## 2021-11-18 DIAGNOSIS — J189 Pneumonia, unspecified organism: Secondary | ICD-10-CM

## 2021-11-18 DIAGNOSIS — Z681 Body mass index (BMI) 19 or less, adult: Secondary | ICD-10-CM | POA: Diagnosis not present

## 2021-11-18 DIAGNOSIS — I10 Essential (primary) hypertension: Secondary | ICD-10-CM | POA: Diagnosis not present

## 2021-11-18 DIAGNOSIS — R61 Generalized hyperhidrosis: Secondary | ICD-10-CM | POA: Diagnosis not present

## 2021-11-18 DIAGNOSIS — R6889 Other general symptoms and signs: Secondary | ICD-10-CM | POA: Diagnosis not present

## 2021-11-18 DIAGNOSIS — E43 Unspecified severe protein-calorie malnutrition: Secondary | ICD-10-CM | POA: Diagnosis not present

## 2021-11-18 DIAGNOSIS — F0284 Dementia in other diseases classified elsewhere, unspecified severity, with anxiety: Secondary | ICD-10-CM | POA: Diagnosis present

## 2021-11-18 DIAGNOSIS — I5033 Acute on chronic diastolic (congestive) heart failure: Secondary | ICD-10-CM | POA: Diagnosis not present

## 2021-11-18 DIAGNOSIS — B9789 Other viral agents as the cause of diseases classified elsewhere: Secondary | ICD-10-CM | POA: Diagnosis present

## 2021-11-18 DIAGNOSIS — J690 Pneumonitis due to inhalation of food and vomit: Secondary | ICD-10-CM | POA: Diagnosis not present

## 2021-11-18 DIAGNOSIS — R9431 Abnormal electrocardiogram [ECG] [EKG]: Secondary | ICD-10-CM | POA: Diagnosis not present

## 2021-11-18 DIAGNOSIS — R0602 Shortness of breath: Secondary | ICD-10-CM | POA: Diagnosis not present

## 2021-11-18 DIAGNOSIS — Z743 Need for continuous supervision: Secondary | ICD-10-CM | POA: Diagnosis not present

## 2021-11-18 DIAGNOSIS — E1122 Type 2 diabetes mellitus with diabetic chronic kidney disease: Secondary | ICD-10-CM | POA: Diagnosis present

## 2021-11-18 DIAGNOSIS — K219 Gastro-esophageal reflux disease without esophagitis: Secondary | ICD-10-CM | POA: Diagnosis not present

## 2021-11-18 DIAGNOSIS — J1289 Other viral pneumonia: Secondary | ICD-10-CM | POA: Diagnosis present

## 2021-11-18 DIAGNOSIS — Z79899 Other long term (current) drug therapy: Secondary | ICD-10-CM | POA: Diagnosis not present

## 2021-11-18 DIAGNOSIS — J90 Pleural effusion, not elsewhere classified: Secondary | ICD-10-CM | POA: Diagnosis not present

## 2021-11-18 DIAGNOSIS — I130 Hypertensive heart and chronic kidney disease with heart failure and stage 1 through stage 4 chronic kidney disease, or unspecified chronic kidney disease: Secondary | ICD-10-CM | POA: Diagnosis present

## 2021-11-18 DIAGNOSIS — J9601 Acute respiratory failure with hypoxia: Secondary | ICD-10-CM | POA: Diagnosis not present

## 2021-11-18 DIAGNOSIS — Z515 Encounter for palliative care: Secondary | ICD-10-CM | POA: Diagnosis not present

## 2021-11-18 DIAGNOSIS — R059 Cough, unspecified: Secondary | ICD-10-CM | POA: Diagnosis not present

## 2021-11-18 DIAGNOSIS — R1310 Dysphagia, unspecified: Secondary | ICD-10-CM | POA: Diagnosis not present

## 2021-11-18 DIAGNOSIS — N183 Chronic kidney disease, stage 3 unspecified: Secondary | ICD-10-CM | POA: Diagnosis not present

## 2021-11-18 DIAGNOSIS — Z7189 Other specified counseling: Secondary | ICD-10-CM | POA: Diagnosis not present

## 2021-11-18 LAB — CBC WITH DIFFERENTIAL/PLATELET
Abs Immature Granulocytes: 0.02 10*3/uL (ref 0.00–0.07)
Basophils Absolute: 0 10*3/uL (ref 0.0–0.1)
Basophils Relative: 1 %
Eosinophils Absolute: 0 10*3/uL (ref 0.0–0.5)
Eosinophils Relative: 1 %
HCT: 36 % (ref 36.0–46.0)
Hemoglobin: 11.4 g/dL — ABNORMAL LOW (ref 12.0–15.0)
Immature Granulocytes: 1 %
Lymphocytes Relative: 18 %
Lymphs Abs: 0.8 10*3/uL (ref 0.7–4.0)
MCH: 27.5 pg (ref 26.0–34.0)
MCHC: 31.7 g/dL (ref 30.0–36.0)
MCV: 87 fL (ref 80.0–100.0)
Monocytes Absolute: 0.3 10*3/uL (ref 0.1–1.0)
Monocytes Relative: 8 %
Neutro Abs: 3.1 10*3/uL (ref 1.7–7.7)
Neutrophils Relative %: 71 %
Platelets: 145 10*3/uL — ABNORMAL LOW (ref 150–400)
RBC: 4.14 MIL/uL (ref 3.87–5.11)
RDW: 14.2 % (ref 11.5–15.5)
WBC: 4.2 10*3/uL (ref 4.0–10.5)
nRBC: 0 % (ref 0.0–0.2)

## 2021-11-18 LAB — RESP PANEL BY RT-PCR (FLU A&B, COVID) ARPGX2
Influenza A by PCR: NEGATIVE
Influenza B by PCR: NEGATIVE
SARS Coronavirus 2 by RT PCR: NEGATIVE

## 2021-11-18 LAB — BASIC METABOLIC PANEL
Anion gap: 8 (ref 5–15)
BUN: 16 mg/dL (ref 8–23)
CO2: 29 mmol/L (ref 22–32)
Calcium: 9.6 mg/dL (ref 8.9–10.3)
Chloride: 105 mmol/L (ref 98–111)
Creatinine, Ser: 0.79 mg/dL (ref 0.44–1.00)
GFR, Estimated: >60 mL/min (ref >60)
Glucose, Bld: 105 mg/dL — ABNORMAL HIGH (ref 70–99)
Potassium: 3.8 mmol/L (ref 3.5–5.1)
Sodium: 142 mmol/L (ref 135–145)

## 2021-11-18 MED ORDER — SODIUM CHLORIDE 0.9 % IV SOLN
1.0000 g | Freq: Once | INTRAVENOUS | Status: AC
  Administered 2021-11-18: 1 g via INTRAVENOUS
  Filled 2021-11-18: qty 10

## 2021-11-18 MED ORDER — DOXYCYCLINE HYCLATE 100 MG PO TABS
100.0000 mg | ORAL_TABLET | Freq: Once | ORAL | Status: AC
  Administered 2021-11-18: 100 mg via ORAL
  Filled 2021-11-18: qty 1

## 2021-11-18 MED ORDER — DOXYCYCLINE HYCLATE 100 MG PO CAPS: 100.0000 mg | ORAL_CAPSULE | Freq: Two times a day (BID) | ORAL | 0 refills | Status: DC

## 2021-11-18 NOTE — ED Triage Notes (Signed)
Patient here POV from Home with Cough. ? ?Family endorses Mildly Productive Cough for approximately 4-5 days.  ? ?No Known Fevers.  ? ?NAD Noted during Triage. Active and Alert. BIB Personal Wheelchair. ?

## 2021-11-18 NOTE — ED Provider Notes (Signed)
?[facility] EMERGENCY DEPT ?Provider Note ? ? ?CSN: [id number] ?Arrival date & time: 11/18/21  1053 ? ?  ? ?History ? ?Chief Complaint  ?Patient presents with  ? Cough  ? ? ?[name] is a 88 y.o. female. ? ?The history is provided by the patient, a caregiver and the spouse.  ?Cough ?Cough characteristics:  Non-productive and productive ?Sputum characteristics:  Nondescript ?Severity:  Moderate ?Onset quality:  Gradual ?Duration:  4 days ?Timing:  Intermittent ?Progression:  Waxing and waning ?Chronicity:  New ?Context: upper respiratory infection   ?Relieved by:  Nothing ?Worsened by:  Nothing ?Associated symptoms: shortness of breath   ?Associated symptoms: no chest pain, no chills, no diaphoresis, no ear fullness, no ear pain, no eye discharge, no fever, no headaches, no myalgias, no rash, no sinus congestion, no sore throat, no weight loss and no wheezing   ?Risk factors comment:  Parkisons ? ?  ? ?Home Medications ?Prior to Admission medications   ?Medication Sig Start Date End Date Taking? Authorizing Provider  ?doxycycline (VIBRAMYCIN) 100 MG capsule Take 1 capsule (100 mg total) by mouth 2 (two) times daily for 10 days. 11/18/21 11/28/21 Yes Avital Dancy, DO  ?acetaminophen (TYLENOL) 325 MG tablet Take 650 mg by mouth every 6 (six) hours as needed for mild pain, moderate pain, fever or headache.     [provider]  ?acyclovir (ZOVIRAX) 400 MG tablet TAKE 1 TABLET BY MOUTH TWICE DAILY 02/03/21   [name], MD  ?amLODipine (NORVASC) 10 MG tablet Take 1 tablet (10 mg total) by mouth daily. 06/12/21   Shelly Coss, MD  ?Calcium Carbonate (CALCIUM-CARB 600 PO) Take 600 mg by mouth daily.    [provider]  ?carbidopa-levodopa (SINEMET IR) 25-100 MG tablet TAKE 1 TABLET BY MOUTH DAILY EVERY MORNING, 1 IN THE AFTERNOON, AND 2 IN THE EVENING ?Patient taking differently: Take 1 tablet by mouth See admin instructions. TAKE 1 TABLET BY MOUTH DAILY EVERY MORNING, 1 IN  THE AFTERNOON, AND 2 IN THE EVENING 02/03/21   Tat, Eustace Quail, DO  ?cholecalciferol (VITAMIN D) 1000 units tablet Take 1,000 Units by mouth every evening.     [provider]  ?docusate sodium (COLACE) 100 MG capsule Take 100 mg by mouth daily as needed for mild constipation.    [provider]  ?sertraline (ZOLOFT) 100 MG tablet Take 1 tablet (100 mg total) by mouth daily. 03/04/18   Ludwig Clarks, DO  ?   ? ?Allergies    ?Azithromycin and Tramadol   ? ?Review of Systems   ?Review of Systems  ?Constitutional:  Negative for chills, diaphoresis, fever and weight loss.  ?HENT:  Negative for ear pain and sore throat.   ?Eyes:  Negative for discharge.  ?Respiratory:  Positive for cough and shortness of breath. Negative for wheezing.   ?Cardiovascular:  Negative for chest pain.  ?Musculoskeletal:  Negative for myalgias.  ?Skin:  Negative for rash.  ?Neurological:  Negative for headaches.  ? ?Physical Exam ?Updated Vital Signs ?BP (!) 165/94   Pulse 80   Temp 97.6 ?F (36.4 ?C) (Oral)   Resp (!) 29   Ht '5\' 3"'$  (1.6 m)   Wt 44.7 kg   LMP  (LMP Unknown)   SpO2 98%   BMI 17.46 kg/m?  ?Physical Exam ?Vitals and nursing note reviewed.  ?Constitutional:   ?   General: She is not in acute distress. ?   Appearance: She is well-developed. She is not ill-appearing.  ?HENT:  ?  Head: Normocephalic and atraumatic.  ?   Right Ear: Tympanic membrane normal.  ?   Left Ear: Tympanic membrane normal.  ?   Nose: Nose normal.  ?   Mouth/Throat:  ?   Mouth: Mucous membranes are moist.  ?Eyes:  ?   Extraocular Movements: Extraocular movements intact.  ?   Conjunctiva/sclera: Conjunctivae normal.  ?   Pupils: Pupils are equal, round, and reactive to light.  ?Cardiovascular:  ?   Rate and Rhythm: Normal rate and regular rhythm.  ?   Pulses: Normal pulses.  ?   Heart sounds: Normal heart sounds. No murmur heard. ?Pulmonary:  ?   Effort: Pulmonary effort is normal. No respiratory distress.  ?   Breath sounds: Normal breath  sounds. No wheezing.  ?Abdominal:  ?   General: There is no distension.  ?   Palpations: Abdomen is soft.  ?   Tenderness: There is no abdominal tenderness.  ?Musculoskeletal:     ?   General: No swelling.  ?   Cervical back: Normal range of motion and neck supple.  ?Skin: ?   General: Skin is warm and dry.  ?   Capillary Refill: Capillary refill takes less than 2 seconds.  ?Neurological:  ?   Mental Status: She is alert.  ?Psychiatric:     ?   Mood and Affect: Mood normal.  ? ? ?ED Results / Procedures / Treatments   ?Labs ?(all labs ordered are listed, but only abnormal results are displayed) ?Labs Reviewed  ?CBC WITH DIFFERENTIAL/PLATELET - Abnormal; Notable for the following components:  ?    Result Value  ? Hemoglobin 11.4 (*)   ? Platelets 145 (*)   ? All other components within normal limits  ?BASIC METABOLIC PANEL - Abnormal; Notable for the following components:  ? Glucose, Bld 105 (*)   ? All other components within normal limits  ?RESP PANEL BY RT-PCR (FLU A&B, COVID) ARPGX2  ? ? ?EKG ?EKG Interpretation ? ?Date/Time:  Tuesday November 18 2021 11:09:13 EDT ?Ventricular Rate:  79 ?PR Interval:  157 ?QRS Duration: 97 ?QT Interval:  453 ?QTC Calculation: 520 ?R Axis:   -15 ?Text Interpretation: Sinus rhythm LVH with secondary repolarization abnormality Confirmed by Lennice Sites ([id number]) on 11/18/2021 11:15:46 AM ? ?Radiology ?DG Chest Portable 1 View ? ?Result Date: 11/18/2021 ?CLINICAL DATA:  Productive cough for 5 days EXAM: PORTABLE CHEST 1 VIEW COMPARISON:  06/08/2021 FINDINGS: Cardiac enlargement without heart failure.  Atherosclerotic aorta. Left lower lobe airspace disease has progressed. Small left pleural effusion also has progressed. Surgical staples in the right lung base unchanged. Mild pleural scarring on the right unchanged. IMPRESSION: Progression of left lower lobe airspace disease and left effusion compared with the prior study. Possible pneumonia. Followup PA and lateral chest X-ray is  recommended in 3-4 weeks following trial of antibiotic therapy to ensure resolution and exclude underlying malignancy. Electronically Signed   By: Franchot Gallo M.D.   On: 11/18/2021 11:22   ? ?Procedures ?Procedures  ? ? ?Medications Ordered in ED ?Medications  ?cefTRIAXone (ROCEPHIN) 1 g in sodium chloride 0.9 % 100 mL IVPB (1 g Intravenous New Bag/Given 11/18/21 1158)  ?doxycycline (VIBRA-TABS) tablet 100 mg (100 mg Oral Given 11/18/21 1153)  ? ? ?ED Course/ Medical Decision Making/ A&P ?  ?                        ?Medical Decision Making ?Amount and/or Complexity of Data Reviewed ?Labs:  ordered. ?Radiology: ordered. ? ?Risk ?Prescription drug management. ? ? ?[name] is here with cough and concern for pneumonia.  Normal vitals.  No fever.  No signs of respiratory distress.  Very well-appearing.  History of Parkinson's.  Has home health aide.  Has had somewhat of a productive cough over the last 4 days.  Some intermittent issues with breathing.  Clear breath sounds.  May be diminished in the lower lobes bilaterally.  Had pneumonia several months ago.  Denies any fevers or chills.  No chest pain.  No active shortness of breath.  Has been eating and drinking okay without any issues.  Differential diagnosis is likely viral versus pneumonia.  Do not have any concern for sepsis at this time given normal vitals.  We will check basic labs, chest x-ray.  EKG shows sinus rhythm per my review and interpretation.  No concern for ACS or PE. ? ?Chest x-ray appears to have a left lower lobe pneumonia per my review and interpretation.  This is similar to x-ray that she had about half a year ago when she was treated for pneumonia as well.  Did not have an x-ray done in between those.  Family states that she responded very well to the antibiotics.  Overall we will do a dose of IV Rocephin and doxycycline here in the ED.  If labs are unremarkable anticipate discharge to home with PCP follow-up and need for repeat chest  x-ray to make sure these are improving.  Shared decision was made to hopefully get her outpatient treatment as family would like for her to avoid hospital.  They state mentally she is at her baseline. ? ?Lab work for

## 2021-11-18 NOTE — Discharge Instructions (Addendum)
Take next dose antibiotic tonight at dinner.  Please follow-up with your primary care doctor for reevaluation after you finish your course of antibiotics.  A repeat chest x-ray may be helpful at that time.  If patient develops fever greater than 101, change in her mental status or other concerning features please return for evaluation. ?

## 2021-11-20 ENCOUNTER — Encounter (HOSPITAL_COMMUNITY): Payer: Self-pay

## 2021-11-20 ENCOUNTER — Emergency Department (HOSPITAL_COMMUNITY): Payer: Medicare Other

## 2021-11-20 ENCOUNTER — Other Ambulatory Visit: Payer: Self-pay

## 2021-11-20 ENCOUNTER — Inpatient Hospital Stay (HOSPITAL_COMMUNITY)
Admission: EM | Admit: 2021-11-20 | Discharge: 2021-11-23 | DRG: 177 | Disposition: A | Discharge status: Hospice | Payer: Medicare Other | Attending: Internal Medicine | Admitting: Internal Medicine

## 2021-11-20 DIAGNOSIS — Z66 Do not resuscitate: Secondary | ICD-10-CM | POA: Diagnosis present

## 2021-11-20 DIAGNOSIS — B9789 Other viral agents as the cause of diseases classified elsewhere: Secondary | ICD-10-CM | POA: Diagnosis present

## 2021-11-20 DIAGNOSIS — R61 Generalized hyperhidrosis: Secondary | ICD-10-CM | POA: Diagnosis not present

## 2021-11-20 DIAGNOSIS — J1289 Other viral pneumonia: Secondary | ICD-10-CM | POA: Diagnosis present

## 2021-11-20 DIAGNOSIS — I130 Hypertensive heart and chronic kidney disease with heart failure and stage 1 through stage 4 chronic kidney disease, or unspecified chronic kidney disease: Secondary | ICD-10-CM | POA: Diagnosis present

## 2021-11-20 DIAGNOSIS — G20 Parkinson's disease: Secondary | ICD-10-CM | POA: Diagnosis present

## 2021-11-20 DIAGNOSIS — J4520 Mild intermittent asthma, uncomplicated: Secondary | ICD-10-CM | POA: Diagnosis present

## 2021-11-20 DIAGNOSIS — E43 Unspecified severe protein-calorie malnutrition: Secondary | ICD-10-CM | POA: Diagnosis present

## 2021-11-20 DIAGNOSIS — Z79899 Other long term (current) drug therapy: Secondary | ICD-10-CM | POA: Diagnosis not present

## 2021-11-20 DIAGNOSIS — J189 Pneumonia, unspecified organism: Secondary | ICD-10-CM | POA: Diagnosis not present

## 2021-11-20 DIAGNOSIS — N183 Chronic kidney disease, stage 3 unspecified: Secondary | ICD-10-CM | POA: Diagnosis present

## 2021-11-20 DIAGNOSIS — Z20822 Contact with and (suspected) exposure to covid-19: Secondary | ICD-10-CM | POA: Diagnosis present

## 2021-11-20 DIAGNOSIS — Z743 Need for continuous supervision: Secondary | ICD-10-CM | POA: Diagnosis not present

## 2021-11-20 DIAGNOSIS — J90 Pleural effusion, not elsewhere classified: Secondary | ICD-10-CM | POA: Diagnosis present

## 2021-11-20 DIAGNOSIS — R1310 Dysphagia, unspecified: Secondary | ICD-10-CM | POA: Diagnosis not present

## 2021-11-20 DIAGNOSIS — J690 Pneumonitis due to inhalation of food and vomit: Secondary | ICD-10-CM | POA: Diagnosis present

## 2021-11-20 DIAGNOSIS — F0283 Dementia in other diseases classified elsewhere, unspecified severity, with mood disturbance: Secondary | ICD-10-CM | POA: Diagnosis present

## 2021-11-20 DIAGNOSIS — R64 Cachexia: Secondary | ICD-10-CM | POA: Diagnosis present

## 2021-11-20 DIAGNOSIS — F419 Anxiety disorder, unspecified: Secondary | ICD-10-CM | POA: Diagnosis not present

## 2021-11-20 DIAGNOSIS — R0902 Hypoxemia: Secondary | ICD-10-CM | POA: Diagnosis not present

## 2021-11-20 DIAGNOSIS — Z801 Family history of malignant neoplasm of trachea, bronchus and lung: Secondary | ICD-10-CM

## 2021-11-20 DIAGNOSIS — R54 Age-related physical debility: Secondary | ICD-10-CM | POA: Diagnosis present

## 2021-11-20 DIAGNOSIS — I160 Hypertensive urgency: Secondary | ICD-10-CM | POA: Diagnosis present

## 2021-11-20 DIAGNOSIS — R0602 Shortness of breath: Secondary | ICD-10-CM | POA: Diagnosis present

## 2021-11-20 DIAGNOSIS — J9601 Acute respiratory failure with hypoxia: Secondary | ICD-10-CM | POA: Diagnosis present

## 2021-11-20 DIAGNOSIS — Z515 Encounter for palliative care: Secondary | ICD-10-CM | POA: Diagnosis not present

## 2021-11-20 DIAGNOSIS — K219 Gastro-esophageal reflux disease without esophagitis: Secondary | ICD-10-CM | POA: Diagnosis present

## 2021-11-20 DIAGNOSIS — I10 Essential (primary) hypertension: Secondary | ICD-10-CM | POA: Diagnosis present

## 2021-11-20 DIAGNOSIS — Z853 Personal history of malignant neoplasm of breast: Secondary | ICD-10-CM

## 2021-11-20 DIAGNOSIS — Z681 Body mass index (BMI) 19 or less, adult: Secondary | ICD-10-CM

## 2021-11-20 DIAGNOSIS — I5033 Acute on chronic diastolic (congestive) heart failure: Secondary | ICD-10-CM | POA: Diagnosis present

## 2021-11-20 DIAGNOSIS — F0284 Dementia in other diseases classified elsewhere, unspecified severity, with anxiety: Secondary | ICD-10-CM | POA: Diagnosis present

## 2021-11-20 DIAGNOSIS — Z7189 Other specified counseling: Secondary | ICD-10-CM | POA: Diagnosis not present

## 2021-11-20 DIAGNOSIS — E1122 Type 2 diabetes mellitus with diabetic chronic kidney disease: Secondary | ICD-10-CM | POA: Diagnosis present

## 2021-11-20 DIAGNOSIS — Z8572 Personal history of non-Hodgkin lymphomas: Secondary | ICD-10-CM

## 2021-11-20 DIAGNOSIS — R6889 Other general symptoms and signs: Secondary | ICD-10-CM | POA: Diagnosis not present

## 2021-11-20 DIAGNOSIS — F32A Depression, unspecified: Secondary | ICD-10-CM | POA: Diagnosis present

## 2021-11-20 DIAGNOSIS — Z9013 Acquired absence of bilateral breasts and nipples: Secondary | ICD-10-CM

## 2021-11-20 DIAGNOSIS — Z8619 Personal history of other infectious and parasitic diseases: Secondary | ICD-10-CM

## 2021-11-20 DIAGNOSIS — D500 Iron deficiency anemia secondary to blood loss (chronic): Secondary | ICD-10-CM | POA: Diagnosis present

## 2021-11-20 DIAGNOSIS — J45909 Unspecified asthma, uncomplicated: Secondary | ICD-10-CM

## 2021-11-20 LAB — CBC WITH DIFFERENTIAL/PLATELET
Abs Immature Granulocytes: 0.02 10*3/uL (ref 0.00–0.07)
Basophils Absolute: 0 10*3/uL (ref 0.0–0.1)
Basophils Relative: 1 %
Eosinophils Absolute: 0.1 10*3/uL (ref 0.0–0.5)
Eosinophils Relative: 1 %
HCT: 36.1 % (ref 36.0–46.0)
Hemoglobin: 11.5 g/dL — ABNORMAL LOW (ref 12.0–15.0)
Immature Granulocytes: 0 %
Lymphocytes Relative: 12 %
Lymphs Abs: 0.6 10*3/uL — ABNORMAL LOW (ref 0.7–4.0)
MCH: 28.4 pg (ref 26.0–34.0)
MCHC: 31.9 g/dL (ref 30.0–36.0)
MCV: 89.1 fL (ref 80.0–100.0)
Monocytes Absolute: 0.2 10*3/uL (ref 0.1–1.0)
Monocytes Relative: 4 %
Neutro Abs: 4 10*3/uL (ref 1.7–7.7)
Neutrophils Relative %: 82 %
Platelets: 164 10*3/uL (ref 150–400)
RBC: 4.05 MIL/uL (ref 3.87–5.11)
RDW: 14.2 % (ref 11.5–15.5)
WBC: 4.9 10*3/uL (ref 4.0–10.5)
nRBC: 0 % (ref 0.0–0.2)

## 2021-11-20 LAB — COMPREHENSIVE METABOLIC PANEL
ALT: 7 U/L (ref 0–44)
AST: 17 U/L (ref 15–41)
Albumin: 3.4 g/dL — ABNORMAL LOW (ref 3.5–5.0)
Alkaline Phosphatase: 78 U/L (ref 38–126)
Anion gap: 8 (ref 5–15)
BUN: 15 mg/dL (ref 8–23)
CO2: 27 mmol/L (ref 22–32)
Calcium: 9.1 mg/dL (ref 8.9–10.3)
Chloride: 108 mmol/L (ref 98–111)
Creatinine, Ser: 0.76 mg/dL (ref 0.44–1.00)
GFR, Estimated: >60 mL/min (ref >60)
Glucose, Bld: 112 mg/dL — ABNORMAL HIGH (ref 70–99)
Potassium: 4 mmol/L (ref 3.5–5.1)
Sodium: 143 mmol/L (ref 135–145)
Total Bilirubin: 0.7 mg/dL (ref 0.3–1.2)
Total Protein: 6.5 g/dL (ref 6.5–8.1)

## 2021-11-20 LAB — RESPIRATORY PANEL BY PCR

## 2021-11-20 LAB — LACTIC ACID, PLASMA: Lactic Acid, Venous: 1.1 mmol/L (ref 0.5–1.9)

## 2021-11-20 MED ORDER — HYDRALAZINE HCL 20 MG/ML IJ SOLN
10.0000 mg | Freq: Three times a day (TID) | INTRAMUSCULAR | Status: DC | PRN
  Administered 2021-11-22: 10 mg via INTRAVENOUS
  Filled 2021-11-20: qty 1

## 2021-11-20 MED ORDER — SODIUM CHLORIDE 0.9 % IV SOLN
1.0000 g | Freq: Once | INTRAVENOUS | Status: AC
  Administered 2021-11-20: 1 g via INTRAVENOUS
  Filled 2021-11-20: qty 10

## 2021-11-20 MED ORDER — ACETAMINOPHEN 325 MG PO TABS
650.0000 mg | ORAL_TABLET | Freq: Four times a day (QID) | ORAL | Status: DC | PRN
  Administered 2021-11-20 – 2021-11-21 (×2): 650 mg via ORAL
  Filled 2021-11-20 (×2): qty 2

## 2021-11-20 MED ORDER — IPRATROPIUM-ALBUTEROL 0.5-2.5 (3) MG/3ML IN SOLN
3.0000 mL | Freq: Four times a day (QID) | RESPIRATORY_TRACT | Status: AC
  Administered 2021-11-20 – 2021-11-21 (×4): 3 mL via RESPIRATORY_TRACT
  Filled 2021-11-20 (×4): qty 3

## 2021-11-20 MED ORDER — ACETAMINOPHEN 650 MG RE SUPP: 650.0000 mg | Freq: Four times a day (QID) | RECTAL | Status: DC | PRN

## 2021-11-20 MED ORDER — VITAMIN D 25 MCG (1000 UNIT) PO TABS
1000.0000 [IU] | ORAL_TABLET | Freq: Every evening | ORAL | Status: DC
  Administered 2021-11-20 – 2021-11-21 (×2): 1000 [IU] via ORAL
  Filled 2021-11-20 (×2): qty 1

## 2021-11-20 MED ORDER — CARBIDOPA-LEVODOPA 25-100 MG PO TABS
2.0000 | ORAL_TABLET | Freq: Every evening | ORAL | Status: DC
  Administered 2021-11-20 – 2021-11-21 (×2): 2 via ORAL
  Filled 2021-11-20 (×4): qty 2

## 2021-11-20 MED ORDER — GUAIFENESIN ER 600 MG PO TB12
600.0000 mg | ORAL_TABLET | Freq: Two times a day (BID) | ORAL | Status: DC
Start: 2021-11-20 — End: 2021-11-23
  Administered 2021-11-20 – 2021-11-21 (×4): 600 mg via ORAL
  Filled 2021-11-20 (×6): qty 1

## 2021-11-20 MED ORDER — IPRATROPIUM-ALBUTEROL 0.5-2.5 (3) MG/3ML IN SOLN
3.0000 mL | Freq: Once | RESPIRATORY_TRACT | Status: AC
  Administered 2021-11-20: 3 mL via RESPIRATORY_TRACT
  Filled 2021-11-20: qty 3

## 2021-11-20 MED ORDER — CARBIDOPA-LEVODOPA 25-100 MG PO TABS
1.0000 | ORAL_TABLET | Freq: Two times a day (BID) | ORAL | Status: DC
  Administered 2021-11-20 – 2021-11-22 (×3): 1 via ORAL
  Filled 2021-11-20 (×5): qty 1

## 2021-11-20 MED ORDER — HYDRALAZINE HCL 20 MG/ML IJ SOLN
5.0000 mg | Freq: Four times a day (QID) | INTRAMUSCULAR | Status: DC | PRN
  Administered 2021-11-20 (×2): 5 mg via INTRAVENOUS
  Filled 2021-11-20: qty 1

## 2021-11-20 MED ORDER — CARBIDOPA-LEVODOPA 25-100 MG PO TABS: 1.0000 | ORAL_TABLET | ORAL | Status: DC

## 2021-11-20 MED ORDER — FLUTICASONE FUROATE-VILANTEROL 200-25 MCG/ACT IN AEPB
1.0000 | INHALATION_SPRAY | Freq: Every day | RESPIRATORY_TRACT | Status: DC
  Administered 2021-11-20: 1 via RESPIRATORY_TRACT
  Filled 2021-11-20 (×2): qty 28

## 2021-11-20 MED ORDER — DOXYCYCLINE HYCLATE 100 MG PO TABS
100.0000 mg | ORAL_TABLET | Freq: Two times a day (BID) | ORAL | Status: DC
  Administered 2021-11-20 (×2): 100 mg via ORAL
  Filled 2021-11-20 (×2): qty 1

## 2021-11-20 MED ORDER — DOXYCYCLINE HYCLATE 100 MG PO TABS
100.0000 mg | ORAL_TABLET | Freq: Two times a day (BID) | ORAL | Status: DC
  Filled 2021-11-20: qty 1

## 2021-11-20 MED ORDER — DOCUSATE SODIUM 100 MG PO CAPS: 100.0000 mg | ORAL_CAPSULE | Freq: Every day | ORAL | Status: DC | PRN

## 2021-11-20 MED ORDER — SERTRALINE HCL 100 MG PO TABS
100.0000 mg | ORAL_TABLET | Freq: Every day | ORAL | Status: DC
  Administered 2021-11-20 – 2021-11-22 (×3): 100 mg via ORAL
  Filled 2021-11-20 (×5): qty 1

## 2021-11-20 MED ORDER — SODIUM CHLORIDE 0.9 % IV SOLN: INTRAVENOUS | Status: DC

## 2021-11-20 MED ORDER — SODIUM CHLORIDE 0.9 % IV SOLN: 1.0000 g | INTRAVENOUS | Status: DC

## 2021-11-20 MED ORDER — ENOXAPARIN SODIUM 30 MG/0.3ML IJ SOSY
30.0000 mg | PREFILLED_SYRINGE | INTRAMUSCULAR | Status: DC
  Administered 2021-11-20 – 2021-11-22 (×3): 30 mg via SUBCUTANEOUS
  Filled 2021-11-20 (×3): qty 0.3

## 2021-11-20 NOTE — ED Notes (Signed)
Placed pt on 4L 02 per [place] ?

## 2021-11-20 NOTE — Assessment & Plan Note (Signed)
No signs of exacerbation on exam, no wheezing appreciated ?Continue advair, scheduled duonebs ?No indication for steroids at this time  ?

## 2021-11-20 NOTE — ED Notes (Signed)
Dietary notified about diet order and needing meal tray ? ?

## 2021-11-20 NOTE — Assessment & Plan Note (Signed)
hgb stable at baseline of 11-12 ?Continue to monitor  ?

## 2021-11-20 NOTE — Progress Notes (Signed)
PHARMACIST - PHYSICIAN COMMUNICATION ? ?CONCERNING:  Enoxaparin (Lovenox) for DVT Prophylaxis  ? ? ?RECOMMENDATION: ?Patient was prescribed enoxaprin '40mg'$  q24 hours for VTE prophylaxis.  ? ?Filed Weights  ? 11/20/21 1007  ?Weight: 44.7 kg (98 lb 8.7 oz)  ? ? ?Body mass index is 17.46 kg/m?. ? ?Estimated Creatinine Clearance: 31.7 mL/min (by C-G formula based on SCr of 0.76 mg/dL). ? ? ?Patient is candidate for enoxaparin '30mg'$  every 24 hours based on CrCl <1m/min or Weight <45kg ? ?DESCRIPTION: ?[facility] ? ?Patient is now receiving enoxaparin 30 mg every 24 hours  ? ? ?SBerta Minor PharmD ?Clinical Pharmacist  ?11/20/2021 ?1:31 PM ? ?

## 2021-11-20 NOTE — ED Notes (Signed)
No change in BP with hydralazine '5mg'$  MD notified ?

## 2021-11-20 NOTE — Assessment & Plan Note (Signed)
Continue norvasc and prn IV parameters as she has been a bit hypertensive while in ED  ?

## 2021-11-20 NOTE — ED Triage Notes (Signed)
Pt arrived via [facility] from home for increased SOBx2 days. Pt was recently dx w/pneumonia. Pt's lungs are audible w/o stethoscope and are rattling sound. Pt is tachypneic. Pt is A&Ox2 to self and place. EMS gave nitrox1 to bring bp down and it brought it from 168/105 to 132/85. Pt arrived on a NRB. ?

## 2021-11-20 NOTE — Assessment & Plan Note (Signed)
Continue sinemet IR TID as prescribed  ?RT consult ordered  ?Nutrition consult due to cachexia and poor PO intake  ?PT eval  ?

## 2021-11-20 NOTE — Assessment & Plan Note (Signed)
Nutrition consult

## 2021-11-20 NOTE — Assessment & Plan Note (Signed)
Continue acyclovir °

## 2021-11-20 NOTE — Assessment & Plan Note (Addendum)
88 year old female presenting with worsening shortness of breath, cough, known LLL pneumonia found to be hypoxic to 83% on RA with shortness of breath, tachypnea and dyspnea.  ?-admit to med surg for CAP/respiratory failure ?-repeat CXR with opacity in LLL concerning for pneumonia with stable left pleural effusion  ?-continue oxygen at 2L and wean as tolerated ?-blood cultures pending ?-check urine Ag studies ?-check RVP/flu and covid negative on 4/25 ?-continue rocephin and doxycycline ?-duonebs scheduled ?-IS to bedside ?-mucinex ?-gentle IVF ?-RT consult  ?

## 2021-11-20 NOTE — ED Notes (Signed)
MD notified of patients BP 180/114 no new orders at this time ?

## 2021-11-20 NOTE — ED Notes (Signed)
Called 5W to get purple man assigned since bed has been assigned ?

## 2021-11-20 NOTE — Assessment & Plan Note (Signed)
Presenting with systolic >568 and diastolic >127 ?No complaints of chest pain, headache, vision changes or acute changes on ekg. Troponin wnl x 2 ?History of being on amlodipine in the past, stopped due to orthostasis. Husband states her bp can get elevated when anxious ?Will do hydralazine prn for systolic >517; however, if remains HTN consider adding low dose daily medication with close f/u with pcp ?

## 2021-11-20 NOTE — H&P (Signed)
?History and Physical  ? ? ?Patient: [name] PIR:[id number] DOB: 05/28/1928 ?DOA: 11/20/2021 ?DOS: the patient was seen and examined on 11/20/2021 ?PCP: [name], MD  ?Patient coming from: Home - lives with her husband and they have aide. She uses a walker at baseline and WC to transport.  ? ? ?Chief Complaint: shortness of breath  ? ?HPI: [name] is a 88 y.o. female with medical history significant of anxiety, asthma, depression, GERD, HTN, NHL, PD, CKD stage 3, remote hx of breast cancer who presented to ED with complaints of  shortness of breath. Her husband states that her symptoms started over the weekend with a productive cough. She started to have shortness of breath on Tuesday and was seen at [facility] on 11/18/21 with concerns for pneumonia. Given dose of rocephin and PO doxycycline. After going back home, shortness of breath seem to progress. This morning her husband states she was short of breath and didn't look like her normal self so they called EMS.  She was tachypneic and hypertensive with oxygenation of 83% on room air. She was placed on NRB.  ? ? ?Husband denies any fever/chills, vision changes/headaches, chest pain or palpitations, abdominal pain, N/V/D, dysuria or leg swelling. She has poor PO intake at baseline and is quite thin.  ? ?She does not smoke or drink.  ? ? ?ER Course:  vitals: afebrile, bp: 141/104, HR: 96, RR: 30, oxygen: 83%RA<100% on 2L.  ?Pertinent labs: hgb: 11.5 ?CXR: unchanged left pleural effusion and left lower lung airspace disease concerning for pneumonia.  ?In ED: given rocephin and duoneb.  ? ? ? ?Review of Systems: As mentioned in the history of present illness. All other systems reviewed and are negative. ?Past Medical History:  ?Diagnosis Date  ? Anxiety   ? Asthma   ? Depression   ? Diabetes mellitus without complication ([place])   ? 01/24/15.Marland KitchenMarland Kitchenpt denies  ? GERD (gastroesophageal reflux disease)   ? Hearing loss   ? Hypertension   ? NHL (non-Hodgkin's  lymphoma) ([place])   ? nhl dx 9/04 breast ca dx1/12  ? Parkinson disease ([place])   ? ?Past Surgical History:  ?Procedure Laterality Date  ? Ba-HA Ear implant    ? ESOPHAGOGASTRODUODENOSCOPY N/A 02/04/2017  ? Procedure: ESOPHAGOGASTRODUODENOSCOPY (EGD);  Surgeon: Arta Silence, MD;  Location: [facility];  Service: Endoscopy;  Laterality: N/A;  ? IR THORACENTESIS ASP PLEURAL SPACE W/IMG GUIDE  01/30/2020  ? MASTECTOMY  2 /8/ 12  ? bilateral  ? MASTECTOMY    ? ?Social History:  reports that she has never smoked. She has never used smokeless tobacco. She reports that she does not drink alcohol and does not use drugs. ? ?Allergies  ?Allergen Reactions  ? Azithromycin Other (See Comments)  ?  confusion  ? Tramadol Other (See Comments)  ?  Hallucinations  ? ? ?Family History  ?Problem Relation Age of Onset  ? Lung cancer Father   ? Cancer Father   ?     lung  ? Cancer Mother   ? ? ?Prior to Admission medications   ?Medication Sig Start Date End Date Taking? Authorizing Provider  ?doxycycline (VIBRAMYCIN) 100 MG capsule Take 1 capsule (100 mg total) by mouth 2 (two) times daily for 10 days. 11/18/21 11/28/21 Yes Curatolo, Adam, DO  ?acetaminophen (TYLENOL) 325 MG tablet Take 650 mg by mouth every 6 (six) hours as needed for mild pain, moderate pain, fever or headache.     [provider]  ?acyclovir (ZOVIRAX) 400  MG tablet TAKE 1 TABLET BY MOUTH TWICE DAILY 02/03/21   [name], MD  ?amLODipine (NORVASC) 10 MG tablet Take 1 tablet (10 mg total) by mouth daily. 06/12/21   Shelly Coss, MD  ?Calcium Carbonate (CALCIUM-CARB 600 PO) Take 600 mg by mouth daily.    [provider]  ?carbidopa-levodopa (SINEMET IR) 25-100 MG tablet TAKE 1 TABLET BY MOUTH DAILY EVERY MORNING, 1 IN THE AFTERNOON, AND 2 IN THE EVENING ?Patient taking differently: Take 1 tablet by mouth See admin instructions. TAKE 1 TABLET BY MOUTH DAILY EVERY MORNING, 1 IN THE AFTERNOON, AND 2 IN THE EVENING 02/03/21   Tat, Eustace Quail, DO   ?cholecalciferol (VITAMIN D) 1000 units tablet Take 1,000 Units by mouth every evening.     [provider]  ?docusate sodium (COLACE) 100 MG capsule Take 100 mg by mouth daily as needed for mild constipation.    [provider]  ?sertraline (ZOLOFT) 100 MG tablet Take 1 tablet (100 mg total) by mouth daily. 03/04/18   Ludwig Clarks, DO  ? ? ?Physical Exam: ?Vitals:  ? 11/20/21 1200 11/20/21 1230 11/20/21 1300 11/20/21 1330  ?BP: (!) 189/115 (!) 184/115 (!) 188/117 (!) 156/91  ?Pulse: 95 94 100 93  ?Resp: (!) 28 (!) 22 (!) 32 (!) 31  ?Temp:      ?TempSrc:      ?SpO2: 100% 100% 100% 99%  ?Weight:      ?Height:      ? ?General:  Appears calm and comfortable and is in NAD. Cachetic  ?Eyes:  PERRL, EOMI, normal lids, iris ?ENT:  extremely hard of hearing lips & tongue, dry mucous membranes; appropriate dentition ?Neck:  no LAD, masses or thyromegaly; no carotid bruits ?Cardiovascular:  RRR, +murmur. No LE edema.  ?Respiratory:   decreased breath sounds in LLL> course breath sounds throughout.   Mildly tachypneic  ?Abdomen:  soft, NT, ND, NABS ?Back:   normal alignment, no CVAT ?Skin:  no rash or induration seen on limited exam ?Musculoskeletal:  grossly normal tone BUE/BLE, good ROM, no bony abnormality ?Lower extremity:  No LE edema.  Limited foot exam with no ulcerations.  2+ distal pulses. ?Psychiatric:  grossly normal mood and affect, speech fluent and appropriate,difficult exam as she can not  hear.  ?Neurologic:  CN 2-12 grossly intact, moves all extremities in coordinated fashion, sensation intact ? ? ?Radiological Exams on Admission: ?Independently reviewed - see discussion in A/P where applicable ? ?DG Chest Portable 1 View ? ?Result Date: 11/20/2021 ?CLINICAL DATA:  Shortness of breath EXAM: PORTABLE CHEST 1 VIEW COMPARISON:  Radiograph 11/18/2021 FINDINGS: Unchanged cardiomediastinal silhouette. Left pleural effusion and adjacent left lower lung consolidation. Postsurgical changes in the  right lung base. No visible pneumothorax. No acute osseous abnormality. IMPRESSION: Unchanged left pleural effusion and left lower lung airspace disease concerning for pneumonia Electronically Signed   By: Maurine Simmering M.D.   On: 11/20/2021 10:47   ? ?EKG: Independently reviewed.  NSR with rate 96 with PVC; nonspecific ST changes with no evidence of acute ischemia ? ? ?Labs on Admission: I have personally reviewed the available labs and imaging studies at the time of the admission. ? ?Pertinent labs:   ?Hgb 11.5 ? ?Assessment and Plan: ?Principal Problem: ?  Community acquired pneumonia with acute respiratory failure with hypoxia  ?Active Problems: ?  Hypertensive urgency ?  Recurrent left pleural effusion ?  Mild intermittent asthma ?  Chronic kidney disease, stage 3 unspecified ([place]) ?  Parkinson's disease ([place]) ?  Anxiety and depression ?  Protein-calorie malnutrition, severe ?  Personal history of non-Hodgkin lymphomas ?  Iron deficiency anemia due to chronic blood loss ?  Acute respiratory failure with hypoxia ([place]) ?  History of herpes zoster ?  ? ?Assessment and Plan: ?* Community acquired pneumonia with acute respiratory failure with hypoxia  ?88 year old female presenting with worsening shortness of breath, cough, known LLL pneumonia found to be hypoxic to 83% on RA with shortness of breath, tachypnea and dyspnea.  ?-admit to med surg for CAP/respiratory failure ?-repeat CXR with opacity in LLL concerning for pneumonia with stable left pleural effusion  ?-continue oxygen at 2L and wean as tolerated ?-blood cultures pending ?-check urine Ag studies ?-check RVP/flu and covid negative on 4/25 ?-continue rocephin and doxycycline ?-duonebs scheduled ?-IS to bedside ?-mucinex ?-gentle IVF ?-RT consult  ? ?Hypertensive urgency ?Presenting with systolic >967 and diastolic >591 ?No complaints of chest pain, headache, vision changes or acute changes on ekg. Troponin wnl x 2 ?History of being on amlodipine in the past,  stopped due to orthostasis. Husband states her bp can get elevated when anxious ?Will do hydralazine prn for systolic >638; however, if remains HTN consider adding low dose daily medication with close f/u with p

## 2021-11-20 NOTE — ED Provider Notes (Signed)
?[facility] ?Provider Note ? ? ?CSN: [id number] ?Arrival date & time: 11/20/21  7253 ? ?  ? ?History ? ?Chief Complaint  ?Patient presents with  ? Shortness of Breath  ? ? ?[name] is a 88 y.o. female. ? ? ?Shortness of Breath ?Associated symptoms: cough   ?Patient with increasing shortness of breath.  Recently been seen and diagnosed with pneumonia.  Had been given Rocephin and doxycycline.  Sats of 83% for EMS and was somewhat hypotensive.  Patient is very hard of hearing and difficult to get history from.  Has squealing from her hearing aids.  Frequent harsh breath sounds with coughing. ?  ? ?Home Medications ?Prior to Admission medications   ?Medication Sig Start Date End Date Taking? Authorizing Provider  ?doxycycline (VIBRAMYCIN) 100 MG capsule Take 1 capsule (100 mg total) by mouth 2 (two) times daily for 10 days. 11/18/21 11/28/21 Yes Curatolo, Adam, DO  ?acetaminophen (TYLENOL) 325 MG tablet Take 650 mg by mouth every 6 (six) hours as needed for mild pain, moderate pain, fever or headache.     [provider]  ?acyclovir (ZOVIRAX) 400 MG tablet TAKE 1 TABLET BY MOUTH TWICE DAILY 02/03/21   [name], MD  ?amLODipine (NORVASC) 10 MG tablet Take 1 tablet (10 mg total) by mouth daily. 06/12/21   Shelly Coss, MD  ?Calcium Carbonate (CALCIUM-CARB 600 PO) Take 600 mg by mouth daily.    [provider]  ?carbidopa-levodopa (SINEMET IR) 25-100 MG tablet TAKE 1 TABLET BY MOUTH DAILY EVERY MORNING, 1 IN THE AFTERNOON, AND 2 IN THE EVENING ?Patient taking differently: Take 1 tablet by mouth See admin instructions. TAKE 1 TABLET BY MOUTH DAILY EVERY MORNING, 1 IN THE AFTERNOON, AND 2 IN THE EVENING 02/03/21   Tat, Eustace Quail, DO  ?cholecalciferol (VITAMIN D) 1000 units tablet Take 1,000 Units by mouth every evening.     [provider]  ?docusate sodium (COLACE) 100 MG capsule Take 100 mg by mouth daily as needed for mild constipation.     [provider]  ?sertraline (ZOLOFT) 100 MG tablet Take 1 tablet (100 mg total) by mouth daily. 03/04/18   Ludwig Clarks, DO  ?   ? ?Allergies    ?Azithromycin and Tramadol   ? ?Review of Systems   ?Review of Systems  ?Respiratory:  Positive for cough and shortness of breath.   ? ?Physical Exam ?Updated Vital Signs ?BP (!) 184/115   Pulse 94   Temp 97.9 ?F (36.6 ?C) (Oral)   Resp (!) 22   Ht '5\' 3"'$  (1.6 m)   Wt 44.7 kg   LMP  (LMP Unknown)   SpO2 100%   BMI 17.46 kg/m?  ?Physical Exam ?Vitals and nursing note reviewed.  ?Cardiovascular:  ?   Rate and Rhythm: Regular rhythm.  ?Pulmonary:  ?   Comments: Diffuse harsh breath sounds with coughing ?Skin: ?   General: Skin is warm.  ?   Capillary Refill: Capillary refill takes less than 2 seconds.  ?Neurological:  ?   Mental Status: She is alert.  ? ? ?ED Results / Procedures / Treatments   ?Labs ?(all labs ordered are listed, but only abnormal results are displayed) ?Labs Reviewed  ?COMPREHENSIVE METABOLIC PANEL - Abnormal; Notable for the following components:  ?    Result Value  ? Glucose, Bld 112 (*)   ? Albumin 3.4 (*)   ? All other components within normal limits  ?CBC WITH DIFFERENTIAL/PLATELET -  Abnormal; Notable for the following components:  ? Hemoglobin 11.5 (*)   ? Lymphs Abs 0.6 (*)   ? All other components within normal limits  ?CULTURE, BLOOD (ROUTINE X 2)  ?CULTURE, BLOOD (ROUTINE X 2)  ?RESPIRATORY PANEL BY PCR  ?LACTIC ACID, PLASMA  ? ? ?EKG ?None ? ?Radiology ?DG Chest Portable 1 View ? ?Result Date: 11/20/2021 ?CLINICAL DATA:  Shortness of breath EXAM: PORTABLE CHEST 1 VIEW COMPARISON:  Radiograph 11/18/2021 FINDINGS: Unchanged cardiomediastinal silhouette. Left pleural effusion and adjacent left lower lung consolidation. Postsurgical changes in the right lung base. No visible pneumothorax. No acute osseous abnormality. IMPRESSION: Unchanged left pleural effusion and left lower lung airspace disease concerning for pneumonia  Electronically Signed   By: Maurine Simmering M.D.   On: 11/20/2021 10:47   ? ?Procedures ?Procedures  ? ? ?Medications Ordered in ED ?Medications  ?hydrALAZINE (APRESOLINE) injection 5 mg (5 mg Intravenous Given 11/20/21 1251)  ?cefTRIAXone (ROCEPHIN) 1 g in sodium chloride 0.9 % 100 mL IVPB (0 g Intravenous Stopped 11/20/21 1115)  ?ipratropium-albuterol (DUONEB) 0.5-2.5 (3) MG/3ML nebulizer solution 3 mL (3 mLs Nebulization Given 11/20/21 1021)  ? ? ?ED Course/ Medical Decision Making/ A&P ?  ?                        ?Medical Decision Making ?Amount and/or Complexity of Data Reviewed ?Labs: ordered. ?Radiology: ordered. ? ?Risk ?Prescription drug management. ?Decision regarding hospitalization. ? ? ?Patient presents with shortness of breath.  Recently seen at [facility] and I reviewed the notes for this.  Found to have pneumonia then.  Given Rocephin and then doxycycline.  Now worsening shortness of breath.  Continue coughing.  Found to be hypoxic with sats about 83 upon arrival.  Improved with oxygen breathing treatment.  Still somewhat tachypnea.  Also having hypertension.  Although I think the hypertension is not the cause of the trouble breathing.  Does not peripheral edema.  Doubt pulmonary embolism.  Appears stable for admission.  Discussed with hospitalist ? ? ? ? ? ? ? ?Final Clinical Impression(s) / ED Diagnoses ?Final diagnoses:  ?Community acquired pneumonia, unspecified laterality  ?Hypoxia  ? ? ?Rx / DC Orders ?ED Discharge Orders   ? ? None  ? ?  ? ? ?  ?Davonna Belling, MD ?11/20/21 1259 ? ?

## 2021-11-20 NOTE — Assessment & Plan Note (Signed)
Stable, continue to monitor  ?

## 2021-11-20 NOTE — ED Notes (Signed)
Called again for [facility] and spoke with 5W charge RN ? ?

## 2021-11-20 NOTE — Assessment & Plan Note (Signed)
-

## 2021-11-20 NOTE — Assessment & Plan Note (Addendum)
Stable, continue to monitor in setting of pnuemonia  ?Appears to be mild and likely not a big contributing factor to her respiratory failure  ?

## 2021-11-21 ENCOUNTER — Inpatient Hospital Stay (HOSPITAL_COMMUNITY): Payer: Medicare Other

## 2021-11-21 DIAGNOSIS — F419 Anxiety disorder, unspecified: Secondary | ICD-10-CM

## 2021-11-21 DIAGNOSIS — I10 Essential (primary) hypertension: Secondary | ICD-10-CM

## 2021-11-21 DIAGNOSIS — I160 Hypertensive urgency: Secondary | ICD-10-CM

## 2021-11-21 DIAGNOSIS — F32A Depression, unspecified: Secondary | ICD-10-CM

## 2021-11-21 DIAGNOSIS — G20 Parkinson's disease: Secondary | ICD-10-CM

## 2021-11-21 DIAGNOSIS — J9601 Acute respiratory failure with hypoxia: Secondary | ICD-10-CM | POA: Diagnosis not present

## 2021-11-21 DIAGNOSIS — J189 Pneumonia, unspecified organism: Secondary | ICD-10-CM | POA: Diagnosis not present

## 2021-11-21 DIAGNOSIS — K219 Gastro-esophageal reflux disease without esophagitis: Secondary | ICD-10-CM

## 2021-11-21 LAB — CBC
HCT: 30.9 % — ABNORMAL LOW (ref 36.0–46.0)
Hemoglobin: 9.9 g/dL — ABNORMAL LOW (ref 12.0–15.0)
MCH: 28.4 pg (ref 26.0–34.0)
MCHC: 32 g/dL (ref 30.0–36.0)
MCV: 88.5 fL (ref 80.0–100.0)
Platelets: 133 10*3/uL — ABNORMAL LOW (ref 150–400)
RBC: 3.49 MIL/uL — ABNORMAL LOW (ref 3.87–5.11)
RDW: 14.5 % (ref 11.5–15.5)
WBC: 3.3 10*3/uL — ABNORMAL LOW (ref 4.0–10.5)
nRBC: 0 % (ref 0.0–0.2)

## 2021-11-21 LAB — BASIC METABOLIC PANEL WITH GFR
Anion gap: 8 (ref 5–15)
BUN: 14 mg/dL (ref 8–23)
CO2: 24 mmol/L (ref 22–32)
Calcium: 8.6 mg/dL — ABNORMAL LOW (ref 8.9–10.3)
Chloride: 110 mmol/L (ref 98–111)
Creatinine, Ser: 0.66 mg/dL (ref 0.44–1.00)
GFR, Estimated: >60 mL/min (ref >60)
Glucose, Bld: 99 mg/dL (ref 70–99)
Potassium: 3.6 mmol/L (ref 3.5–5.1)
Sodium: 142 mmol/L (ref 135–145)

## 2021-11-21 LAB — PROCALCITONIN: Procalcitonin: <0.1 ng/mL

## 2021-11-21 LAB — C-REACTIVE PROTEIN: CRP: 2.8 mg/dL — ABNORMAL HIGH (ref <1.0)

## 2021-11-21 LAB — MRSA NEXT GEN BY PCR, NASAL: MRSA by PCR Next Gen: NOT DETECTED

## 2021-11-21 LAB — BRAIN NATRIURETIC PEPTIDE: B Natriuretic Peptide: 2398.2 pg/mL — ABNORMAL HIGH (ref 0.0–100.0)

## 2021-11-21 MED ORDER — SODIUM CHLORIDE 0.9 % IV SOLN
3.0000 g | Freq: Two times a day (BID) | INTRAVENOUS | Status: DC
  Administered 2021-11-21 – 2021-11-23 (×4): 3 g via INTRAVENOUS
  Filled 2021-11-21 (×4): qty 8

## 2021-11-21 MED ORDER — DILTIAZEM HCL 25 MG/5ML IV SOLN: 10.0000 mg | Freq: Four times a day (QID) | INTRAVENOUS | Status: DC | PRN

## 2021-11-21 MED ORDER — METOPROLOL TARTRATE 50 MG PO TABS
50.0000 mg | ORAL_TABLET | Freq: Two times a day (BID) | ORAL | Status: DC
  Administered 2021-11-21 – 2021-11-22 (×3): 50 mg via ORAL
  Filled 2021-11-21 (×4): qty 1

## 2021-11-21 MED ORDER — ADULT MULTIVITAMIN W/MINERALS CH
1.0000 | ORAL_TABLET | Freq: Every day | ORAL | Status: DC
  Administered 2021-11-21: 1 via ORAL
  Filled 2021-11-21 (×2): qty 1

## 2021-11-21 MED ORDER — FUROSEMIDE 10 MG/ML IJ SOLN
40.0000 mg | Freq: Once | INTRAMUSCULAR | Status: AC
  Administered 2021-11-21: 40 mg via INTRAVENOUS
  Filled 2021-11-21: qty 4

## 2021-11-21 NOTE — Evaluation (Signed)
Occupational Therapy Evaluation ?Patient Details ?Name: [name] ?MRN: [id number] ?DOB: Jan 03, 1928 ?Today's Date: 11/21/2021 ? ? ?History of Present Illness Pt is a 88 yo female admitted with c/o SOB and found to have ARF with hypoxia. Pt with LLL pneumonia.  Pt with h/o anxiety, depression, GERD, HTN, CKD stage 3, parkinsons, breast CA.  Pt with pneumonia appx 6 months ago as well.  ? ?Clinical Impression ?  ?Pt admitted with the above diagnosis and has the deficits listed below. Pt would benefit from cont OT to increase independence with basic adls and adl transfers back to baseline so pt can return home with husband and caregiver at the level she was at prior to coming to the hospital.  Pt has a caregiver 5 mornings a week and 5 nights a week to assist with all basic adls. Husband also assists.  Feel, when pt is medically stable, she can return home if this aid is available to assist pt.  Pt limited this am due to HR into 120s with activity but will continue to see and recommend HHOT if not back to baseline prior to d/c. ?  ?   ? ?Recommendations for follow up therapy are one component of a multi-disciplinary discharge planning process, led by the attending physician.  Recommendations may be updated based on patient status, additional functional criteria and insurance authorization.  ? ?Follow Up Recommendations ? Home health OT  ?  ?Assistance Recommended at Discharge Frequent or constant Supervision/Assistance  ?Patient can return home with the following A little help with walking and/or transfers;A lot of help with bathing/dressing/bathroom;Assistance with cooking/housework;Direct supervision/assist for medications management;Direct supervision/assist for financial management;Assist for transportation;Help with stairs or ramp for entrance ? ?  ?Functional Status Assessment ? Patient has had a recent decline in their functional status and demonstrates the ability to make significant improvements in  function in a reasonable and predictable amount of time.  ?Equipment Recommendations ? None recommended by OT  ?  ?Recommendations for Other Services   ? ? ?  ?Precautions / Restrictions Precautions ?Precautions: Fall ?Precaution Comments: check HR ?Restrictions ?Weight Bearing Restrictions: No  ? ?  ? ?Mobility Bed Mobility ?Overal bed mobility: Needs Assistance ?Bed Mobility: Rolling, Sidelying to Sit, Sit to Sidelying ?Rolling: Min assist ?Sidelying to sit: Mod assist ?  ?  ?Sit to sidelying: Max assist ?General bed mobility comments: Pt required tactile cues to know what to do due to being [facility] and therapist wearing mask. ?  ? ?Transfers ?Overall transfer level: Needs assistance ?Equipment used: 1 person hand held assist ?Transfers: Sit to/from Stand, Bed to chair/wheelchair/BSC ?Sit to Stand: Min assist ?Stand pivot transfers: Mod assist ?  ?  ?  ?  ?General transfer comment: Pt shuffles feet when transerring. HR up to 125 therefore did not ambulate. ?  ? ?  ?Balance Overall balance assessment: Needs assistance ?Sitting-balance support: Feet supported, Bilateral upper extremity supported ?Sitting balance-Leahy Scale: Good ?  ?  ?Standing balance support: Bilateral upper extremity supported, During functional activity ?Standing balance-Leahy Scale: Poor ?Standing balance comment: Pt dependent on walker or outside support to remain safely standing. ?  ?  ?  ?  ?  ?  ?  ?  ?  ?  ?  ?   ? ?ADL either performed or assessed with clinical judgement  ? ?ADL Overall ADL's : Needs assistance/impaired ?Eating/Feeding: Minimal assistance;Sitting ?Eating/Feeding Details (indicate cue type and reason): eval placed for speech to look at swallowing. ?Grooming: Wash/dry hands;Wash/dry face;Oral care;Minimal  assistance;Sitting ?  ?Upper Body Bathing: Moderate assistance;Sitting ?  ?Lower Body Bathing: Moderate assistance;Sit to/from stand;Cueing for compensatory techniques ?Lower Body Bathing Details (indicate cue type and  reason): pt accustomed to aid assisting with bathing ?Upper Body Dressing : Moderate assistance;Sitting ?  ?Lower Body Dressing: Maximal assistance;Sit to/from stand;Cueing for compensatory techniques ?  ?Toilet Transfer: Moderate assistance;Stand-pivot;BSC/3in1 ?Toilet Transfer Details (indicate cue type and reason): pt had +1 assist from therapist. May do better with walker as that is what pt is accustomed to using ?Toileting- Clothing Manipulation and Hygiene: Maximal assistance;Sit to/from stand;Cueing for compensatory techniques ?Toileting - Clothing Manipulation Details (indicate cue type and reason): Pt wears depends at home and aid assists in changing depends and managing clothing ?  ?  ?Functional mobility during ADLs: Moderate assistance;Standard walker ?General ADL Comments: Pt requires assist with most adls as she has an aid at home 5x a week to assist with bathing, dressing, grooming and toileting.  Pt assists at times with these tasks. Pt HR at 120 during session so activity kept to a minimum.  ? ? ? ?Vision Baseline Vision/History: 1 Wears glasses ?Ability to See in Adequate Light: 0 Adequate ?Patient Visual Report: No change from baseline ?Vision Assessment?: No apparent visual deficits  ?   ?Perception   ?  ?Praxis   ?  ? ?Pertinent Vitals/Pain Pain Assessment ?Pain Assessment: Faces ?Faces Pain Scale: Hurts a little bit ?Pain Location: unsure.  General grimacing with movement ?Pain Descriptors / Indicators: Discomfort ?Pain Intervention(s): Monitored during session, Repositioned, Limited activity within patient's tolerance  ? ? ? ?Hand Dominance Right ?  ?Extremity/Trunk Assessment Upper Extremity Assessment ?Upper Extremity Assessment: Generalized weakness ?  ?Lower Extremity Assessment ?Lower Extremity Assessment: Defer to PT evaluation ?  ?Cervical / Trunk Assessment ?Cervical / Trunk Assessment: Kyphotic ?  ?Communication Communication ?Communication: HOH ?  ?Cognition Arousal/Alertness:  Awake/alert ?Behavior During Therapy: Restless ?Overall Cognitive Status: History of cognitive impairments - at baseline ?  ?  ?  ?  ?  ?  ?  ?  ?  ?  ?  ?  ?  ?  ?  ?  ?General Comments: Pt very HOH and mask needed for droplet precautions so evaluation difficult ?  ?  ?General Comments  Pt very limited with communication and unsure of cognitive status due to [facility] and decreased ability to communicate with therapist.  Pt became SOB with very little activity so talking became laborius and was avoided. ? ?  ?Exercises   ?  ?Shoulder Instructions    ? ? ?Home Living Family/patient expects to be discharged to:: Private residence ?  ?  ?  ?  ?  ?  ?  ?  ?  ?  ?  ?  ?  ?  ?  ?  ?Additional Comments: aide 9-1:00 5x/week, then comes back at night to put her to bed ?  ? ?  ?Prior Functioning/Environment Prior Level of Function : Needs assist ? Cognitive Assist : Mobility (cognitive);ADLs (cognitive) ?Mobility (Cognitive): Intermittent cues ?ADLs (Cognitive): Intermittent cues ?Physical Assist : Mobility (physical);ADLs (physical) ?Mobility (physical): Bed mobility;Transfers;Gait;Stairs ?ADLs (physical): Grooming;Bathing;Dressing;Toileting;IADLs ?Mobility Comments: pt uses walker and never walks alone. Requires supervision to min assist at all times when walking with walker. ?ADLs Comments: Aid assists with all adls. pt sponge bathes outside of 1x week getting into shower with aid. ?  ? ?  ?  ?OT Problem List: Decreased activity tolerance;Impaired balance (sitting and/or standing);Decreased cognition;Decreased safety awareness;Decreased knowledge of use  of DME or AE;Cardiopulmonary status limiting activity;Pain ?  ?   ?OT Treatment/Interventions: Self-care/ADL training;Therapeutic activities;Balance training  ?  ?OT Goals(Current goals can be found in the care plan section) Acute Rehab OT Goals ?Patient Stated Goal: husband would like to get pt home ?OT Goal Formulation: With family ?Time For Goal Achievement:  12/05/21 ?Potential to Achieve Goals: Fair ?ADL Goals ?Pt Will Perform Eating: with set-up;sitting ?Pt Will Perform Grooming: with min assist;sitting ?Pt Will Transfer to Toilet: with min guard assist;ambulating;bedside commode;regul

## 2021-11-21 NOTE — Progress Notes (Signed)
?  Transition of Care (TOC) Screening Note ? ? ?Patient Details  ?Name: [name] ?Date of Birth: 13-Dec-1927 ? ? ?Transition of Care (TOC) CM/SW Contact:    ?Benard Halsted, LCSW ?Phone Number: ?11/21/2021, 9:13 AM ? ? ? ?Transition of Care Department [facility]) has reviewed patient and no TOC needs have been identified at this time. We will continue to monitor patient advancement through interdisciplinary progression rounds. If new patient transition needs arise, please place a TOC consult. ? ? ?

## 2021-11-21 NOTE — Progress Notes (Signed)
Initial Nutrition Assessment ? ?DOCUMENTATION CODES:  ? ?Severe malnutrition in context of chronic illness, Underweight ? ?INTERVENTION:  ? ?Encourage good PO intake ?Multivitamin w/ minerals daily ?Magic cup TID with meals, each supplement provides 290 kcal and 9 grams of protein ? ?NUTRITION DIAGNOSIS:  ? ?Severe Malnutrition related to chronic illness as evidenced by severe muscle depletion, severe fat depletion. ? ?GOAL:  ? ?Patient will meet greater than or equal to 90% of their needs ? ?MONITOR:  ? ?PO intake, Supplement acceptance, Labs, Weight trends ? ?REASON FOR ASSESSMENT:  ? ?Consult ?Assessment of nutrition requirement/status, Poor PO ? ?ASSESSMENT:  ? ?88 y.o. female presented to the ED with shortness of breath. PMH includes GERD, HTN, CKD III, breast cancer, malnutrition, and Parkinson's disease. Pt admitted with CAP with acute respiratory failure with hypoxia.  ? ?4/28 - MBS w/ SLP; Dysphagia 1, Honey Thick Liquids ? ?Pt up in chair in room. Pt unable to provide nutrition related information to RD.  ? ?Per EMR, pt weight has been stable since November 2022.  ? ?RD to provide supplements.  ? ?Medications reviewed and include: Vitamin D3, Lasix, IV antibiotics  ?Labs reviewed. ? ?NUTRITION - FOCUSED PHYSICAL EXAM: ? ?Flowsheet Row Most Recent Value  ?Orbital Region Severe depletion  ?Upper Arm Region Severe depletion  ?Thoracic and Lumbar Region Severe depletion  ?Buccal Region Severe depletion  ?Temple Region Severe depletion  ?Clavicle Bone Region Severe depletion  ?Clavicle and Acromion Bone Region Severe depletion  ?Scapular Bone Region Severe depletion  ?Dorsal Hand Severe depletion  ?Patellar Region Severe depletion  ?Anterior Thigh Region Severe depletion  ?Posterior Calf Region Severe depletion  ?Edema (RD Assessment) None  ?Hair Reviewed  ?Eyes Reviewed  ?Mouth Reviewed  ?Skin Reviewed  ?Nails Unable to assess  [Nail Polish]  ? ?Diet Order:   ?Diet Order   ? ?       ?  DIET - DYS 1 Room  service appropriate? No; Fluid consistency: Honey Thick; Fluid restriction: 1500 mL Fluid  Diet effective now       ?  ? ?  ?  ? ?  ? ?EDUCATION NEEDS:  ? ?Not appropriate for education at this time ? ?Skin:  Skin Assessment: Reviewed RN Assessment ? ?Last BM:  No Documentation ? ?Height:  ?Ht Readings from Last 1 Encounters:  ?11/20/21 '5\' 3"'$  (1.6 m)  ? ?Weight:  ?Wt Readings from Last 1 Encounters:  ?11/20/21 44.7 kg  ? ?Ideal Body Weight:  52.3 kg ? ?BMI:  Body mass index is 17.46 kg/m?. ? ?Estimated Nutritional Needs:  ?Kcal:  1400-1600 ?Protein:  70-95 grams ?Fluid:  >/= 1.5 L ? ? ?Hermina Barters RD, LDN ?Clinical Dietitian ?See [facility] for contact information.  ?

## 2021-11-21 NOTE — Evaluation (Signed)
Clinical/Bedside Swallow Evaluation ?Patient Details  ?Name: [name] ?MRN: [id number] ?Date of Birth: Mar 02, 1928 ? ?Today's Date: 11/21/2021 ?Time: SLP Start Time (ACUTE ONLY): 6734 SLP Stop Time (ACUTE ONLY): 1058 ?SLP Time Calculation (min) (ACUTE ONLY): 10 min ? ?Past Medical History:  ?Past Medical History:  ?Diagnosis Date  ? Anxiety   ? Asthma   ? Depression   ? Diabetes mellitus without complication ([place])   ? 1/93/79.Marland KitchenMarland Kitchenpt denies  ? GERD (gastroesophageal reflux disease)   ? Hearing loss   ? Hypertension   ? NHL (non-Hodgkin's lymphoma) ([place])   ? nhl dx 9/04 breast ca dx1/12  ? Parkinson disease ([place])   ? ?Past Surgical History:  ?Past Surgical History:  ?Procedure Laterality Date  ? Ba-HA Ear implant    ? ESOPHAGOGASTRODUODENOSCOPY N/A 02/04/2017  ? Procedure: ESOPHAGOGASTRODUODENOSCOPY (EGD);  Surgeon: Arta Silence, MD;  Location: [facility];  Service: Endoscopy;  Laterality: N/A;  ? IR THORACENTESIS ASP PLEURAL SPACE W/IMG GUIDE  01/30/2020  ? MASTECTOMY  2 /8/ 12  ? bilateral  ? MASTECTOMY    ? ?HPI:  ?[name] is a 88 y.o. female who presented to ED with complaints of shortness of breath. Her husband states that her symptoms started over the weekend with a productive cough. She started to have shortness of breath on Tuesday and was seen at [facility] on [facility]/18/21 with concerns for pneumonia. Given dose of rocephin and PO doxycycline. After going back home, shortness of breath seem to progress. This morning her husband states she was short of breath and didn't look like her normal self so they called EMS. CXR 4/28: "Left base collapse/consolidation with effusion."  Pt with medical history significant of anxiety, asthma, depression, GERD, HTN, NHL, PD, CKD stage 3, remote hx of breast cancer.  Chart review from prior admissions also includes hx Parkinson's disease in history.  ?  ?Assessment / Plan / Recommendation  ?Clinical Impression ? Pt presents with clinical indicators of pharyngeal  dysphagia. Pt with congested, crackling breath sounds on arrival.  With first cup sip of thin liquid there was immediate, prolonged, wet cough. With straw sip of water there was delayed coughing.  Pt exhibited good oral clearance with mild oral resiude with puree and solids. Pt with reported decreased PO intake per chart review. CXR concerning for LLL pneumonia.  Pt did not speak much throughout assessment.  It is unclear if her cochlear implant is functioning, there was feedback heard at end of session.  Throughout assessment pt appeared to be short of breath. Pt had removed ekg leads and SpO2.  PT arrived with pocket pulse ox and measured SpO2 at around 93.  Given pt's medical history and current clinical presentation, there is concern for safety with PO intake at this time. ? ?Recommend pt be made NPO pending instrumental assessment of swallow function. ? ?SLP Visit Diagnosis: Dysphagia, unspecified (R13.10) ?   ?Aspiration Risk ? Moderate aspiration risk  ?  ?Diet Recommendation NPO  ? ?Medication Administration: Via alternative means  ?  ?Other  Recommendations Oral Care Recommendations: Oral care QID   ? ?Recommendations for follow up therapy are one component of a multi-disciplinary discharge planning process, led by the attending physician.  Recommendations may be updated based on patient status, additional functional criteria and insurance authorization. ? ?Follow up Recommendations  (TBD)  ? ? ?  ?Assistance Recommended at Discharge    ?Functional Status Assessment Patient has had a recent decline in their functional status and demonstrates the ability to  make significant improvements in function in a reasonable and predictable amount of time.  ?Frequency and Duration  (TBD)  ?  ?  ?   ? ?Prognosis Prognosis for Safe Diet Advancement:  (TBD)  ? ?  ? ?Swallow Study   ?General HPI: [name] is a 88 y.o. female who presented to ED with complaints of shortness of breath. Her husband states that her  symptoms started over the weekend with a productive cough. She started to have shortness of breath on Tuesday and was seen at [facility] on 11/18/21 with concerns for pneumonia. Given dose of rocephin and PO doxycycline. After going back home, shortness of breath seem to progress. This morning her husband states she was short of breath and didn't look like her normal self so they called EMS. CXR 4/28: "Left base collapse/consolidation with effusion."  Pt with medical history significant of anxiety, asthma, depression, GERD, HTN, NHL, PD, CKD stage 3, remote hx of breast cancer.  Chart review from prior admissions also includes hx Parkinson's disease in history.  ?  ?Oral/Motor/Sensory Function Overall Oral Motor/Sensory Function: Mild impairment ?Facial ROM:  (Could not test) ?Facial Symmetry: Within Functional Limits ?Lingual ROM:  (Could not test) ?Lingual Symmetry: Within Functional Limits ?Lingual Strength:  (Could not test) ?Velum:  (Could not test) ?Mandible: Within Functional Limits   ?Ice Chips Ice chips: Not tested   ?Thin Liquid Thin Liquid: Impaired ?Presentation: Cup;Straw ?Pharyngeal  Phase Impairments: Cough - Immediate;Cough - Delayed  ?  ?Nectar Thick Nectar Thick Liquid: Not tested   ?Honey Thick Honey Thick Liquid: Not tested   ?Puree Puree: Impaired ?Oral Phase Functional Implications: Oral residue   ?Solid ? ? ?  Solid: Impaired ?Oral Phase Functional Implications: Oral residue  ? ?  ? ?Lamia Mariner E Beniah Magnan, MA, CCC-SLP ?Acute Rehabilitation Services ?Office: [phone] ?11/21/2021,11:30 AM ? ? ? ?

## 2021-11-21 NOTE — Progress Notes (Signed)
?  2D echo was attempted, but patient was being transported to another procedure. Will try later ? ?Roseanna Rainbow R ?11/21/2021, 1:51 PM ?

## 2021-11-21 NOTE — Evaluation (Signed)
Physical Therapy Evaluation ?Patient Details ?Name: [name] ?MRN: [id number] ?DOB: June 09, 1928 ?Today's Date: 11/21/2021 ? ?History of Present Illness ? Pt is a 88 yo female admitted with c/o SOB and found to have ARF with hypoxia. Pt with LLL pneumonia.  Pt with h/o anxiety, depression, GERD, HTN, CKD stage 3, parkinsons, breast CA.  Pt with pneumonia appx 6 months ago as well.  ?Clinical Impression ? Pt admitted with above diagnosis. Pt was able to pivot to chair with mod assist for all aspects of mobility. Pt has caregiver pt chart.  Will follow acutely and progress pt as able. Pt currently with functional limitations due to the deficits listed below (see PT Problem List). Pt will benefit from skilled PT to increase their independence and safety with mobility to allow discharge to the venue listed below.     ?   ? ?Recommendations for follow up therapy are one component of a multi-disciplinary discharge planning process, led by the attending physician.  Recommendations may be updated based on patient status, additional functional criteria and insurance authorization. ? ?Follow Up Recommendations Home health PT (has caregiver) ? ?  ?Assistance Recommended at Discharge Frequent or constant Supervision/Assistance  ?Patient can return home with the following ? A lot of help with walking and/or transfers;A lot of help with bathing/dressing/bathroom;Help with stairs or ramp for entrance ? ?  ?Equipment Recommendations None recommended by PT  ?Recommendations for Other Services ?    ?  ?Functional Status Assessment Patient has had a recent decline in their functional status and demonstrates the ability to make significant improvements in function in a reasonable and predictable amount of time.  ? ?  ?Precautions / Restrictions Precautions ?Precautions: Fall ?Precaution Comments: check HR ?Restrictions ?Weight Bearing Restrictions: No  ? ?  ? ?Mobility ? Bed Mobility ?Overal bed mobility: Needs Assistance ?Bed  Mobility: Rolling, Sidelying to Sit, Sit to Sidelying ?Rolling: Min assist ?Sidelying to sit: Mod assist ?  ?  ?  ?General bed mobility comments: Pt required tactile cues to know what to do due to being [facility] and therapist wearing mask. ?  ? ?Transfers ?Overall transfer level: Needs assistance ?Equipment used: 1 person hand held assist ?Transfers: Sit to/from Stand, Bed to chair/wheelchair/BSC ?Sit to Stand: Min assist ?Stand pivot transfers: Mod assist ?  ?  ?  ?  ?General transfer comment: Pt shuffles feet when transerring. HR up to 130 therefore did not ambulate. ?  ? ?Ambulation/Gait ?  ?  ?  ?  ?  ?  ?  ?  ? ?Stairs ?  ?  ?  ?  ?  ? ?Wheelchair Mobility ?  ? ?Modified Rankin (Stroke Patients Only) ?  ? ?  ? ?Balance Overall balance assessment: Needs assistance ?Sitting-balance support: Feet supported, Bilateral upper extremity supported ?Sitting balance-Leahy Scale: Good ?  ?  ?Standing balance support: Bilateral upper extremity supported, During functional activity ?Standing balance-Leahy Scale: Poor ?Standing balance comment: Dependent on UE support ?  ?  ?  ?  ?  ?  ?  ?  ?  ?  ?  ?   ? ? ? ?Pertinent Vitals/Pain Pain Assessment ?Pain Assessment: Faces ?Faces Pain Scale: Hurts a little bit ?Pain Location: unsure.  General grimacing with movement ?Pain Descriptors / Indicators: Discomfort ?Pain Intervention(s): Limited activity within patient's tolerance, Monitored during session, Repositioned  ? ? ?Home Living Family/patient expects to be discharged to:: Private residence ?Living Arrangements: Spouse/significant other ?Available Help at Discharge: Family;Personal care attendant;Available 24  hours/day ?Type of Home: House ?Home Access: Ramped entrance ?  ?  ?  ?Home Layout: One level ?Home Equipment: Rollator (4 wheels);Shower seat;BSC/3in1 ?Additional Comments: aide 9-1:00 5x/week, then comes back at night to put her to bed  ?  ?Prior Function Prior Level of Function : Needs assist ? Cognitive Assist :  Mobility (cognitive);ADLs (cognitive) ?Mobility (Cognitive): Intermittent cues ?ADLs (Cognitive): Intermittent cues ?Physical Assist : Mobility (physical);ADLs (physical) ?Mobility (physical): Bed mobility;Transfers;Gait;Stairs ?ADLs (physical): Grooming;Bathing;Dressing;Toileting;IADLs ?Mobility Comments: pt uses walker and never walks alone. Requires supervision to min assist at all times when walking with walker. ?ADLs Comments: Aid assists with all adls. pt sponge bathes outside of 1x week getting into shower with aid. ?  ? ? ?Hand Dominance  ? Dominant Hand: Right ? ?  ?Extremity/Trunk Assessment  ? Upper Extremity Assessment ?Upper Extremity Assessment: Defer to OT evaluation ?  ? ?Lower Extremity Assessment ?Lower Extremity Assessment: RLE deficits/detail;LLE deficits/detail ?RLE Deficits / Details: grossly 2+/5 ?LLE Deficits / Details: grossly 2+/5 ?  ? ?Cervical / Trunk Assessment ?Cervical / Trunk Assessment: Kyphotic  ?Communication  ? Communication: HOH  ?Cognition Arousal/Alertness: Awake/alert ?Behavior During Therapy: Restless ?Overall Cognitive Status: History of cognitive impairments - at baseline ?  ?  ?  ?  ?  ?  ?  ?  ?  ?  ?  ?  ?  ?  ?  ?  ?General Comments: Pt very HOH and mask needed for droplet precautions so evaluation difficult ?  ?  ? ?  ?General Comments General comments (skin integrity, edema, etc.): Pt very limited with communication and unsure of cognitive status due to [facility] and decreased ability to communicate with therapist.  Pt became SOB with very little activity ? ?  ?Exercises    ? ?Assessment/Plan  ?  ?PT Assessment Patient needs continued PT services  ?PT Problem List Decreased activity tolerance;Decreased balance;Decreased mobility;Decreased knowledge of use of DME;Decreased safety awareness;Decreased knowledge of precautions;Cardiopulmonary status limiting activity ? ?   ?  ?PT Treatment Interventions DME instruction;Gait training;Functional mobility training;Therapeutic  activities;Therapeutic exercise;Balance training;Patient/family education   ? ?PT Goals (Current goals can be found in the Care Plan section)  ?Acute Rehab PT Goals ?Patient Stated Goal: unable to state ?PT Goal Formulation: Patient unable to participate in goal setting ?Time For Goal Achievement: 12/05/21 ?Potential to Achieve Goals: Fair ? ?  ?Frequency Min 3X/week ?  ? ? ?Co-evaluation   ?  ?  ?  ?  ? ? ?  ?AM-PAC PT "6 Clicks" Mobility  ?Outcome Measure Help needed turning from your back to your side while in a flat bed without using bedrails?: A Lot ?Help needed moving from lying on your back to sitting on the side of a flat bed without using bedrails?: A Lot ?Help needed moving to and from a bed to a chair (including a wheelchair)?: A Lot ?Help needed standing up from a chair using your arms (e.g., wheelchair or bedside chair)?: A Lot ?Help needed to walk in hospital room?: A Lot ?Help needed climbing 3-5 steps with a railing? : Total ?6 Click Score: 11 ? ?  ?End of Session Equipment Utilized During Treatment: Gait belt;Oxygen ?Activity Tolerance: Patient limited by fatigue ?Patient left: in chair;with call bell/phone within reach;with chair alarm set ?Nurse Communication: Mobility status ?PT Visit Diagnosis: Muscle weakness (generalized) (M62.81);Unsteadiness on feet (R26.81) ?  ? ?Time: 9323-5573 ?PT Time Calculation (min) (ACUTE ONLY): 16 min ? ? ?Charges:   PT Evaluation ?$PT Eval Moderate  Complexity: 1 Mod ?  ?  ?   ? ? ?Abas Leicht M,PT ?Acute Rehab Services ?[phone] ?[phone] (pager)  ? ?Alvira Philips ?11/21/2021, 3:13 PM ? ?

## 2021-11-21 NOTE — Progress Notes (Signed)
Attempted flutter valve with patient. The patient was not able to understand instructions even with her husband helping. RT offered chest vest but husband declined. He states that their bed at home shakes and that the patient cannot stand the shaking. CPT on hold for now.  ?

## 2021-11-21 NOTE — Progress Notes (Signed)
Pharmacy Antibiotic Note ? ?[name] is a 88 y.o. female admitted on 11/20/2021 with shortness of breath.  Pharmacy has been consulted for Unasyn dosing for aspiration pneumonia coverage. Recent outpatient Doxycycline (Rx'd 4/25 for 10 days), and Ceftriaxone given 4/27 for CAP coverage. ? ?Plan: ?Unasyn 3 gm IV q12h. ?Follow renal function, culture data, clinical progress and antibiotic plans. ? ?Height: '5\' 3"'$  (160 cm) ?Weight: 44.7 kg (98 lb 8.7 oz) ?IBW/kg (Calculated) : 52.4 ? ?Temp (24hrs), Avg:97.8 ?F (36.6 ?C), Min:97 ?F (36.1 ?C), Max:98.7 ?F (37.1 ?C) ? ?Recent Labs  ?Lab 11/18/21 ?1135 11/20/21 ?1015 11/21/21 ?0252  ?WBC 4.2 4.9 3.3*  ?CREATININE 0.79 0.76 0.66  ?LATICACIDVEN  --  1.1  --   ?  ?Estimated Creatinine Clearance: 31.7 mL/min (by C-G formula based on SCr of 0.66 mg/dL).   ? ?Allergies  ?Allergen Reactions  ? Azithromycin Other (See Comments)  ?  confusion  ? Tramadol Other (See Comments)  ?  Hallucinations  ? ? ?Antimicrobials this admission: ? Ceftriaxone x 1 on 4/27 ? Unasyn 4/28 >> ? ?Dose adjustments this admission: ?n/a ? ?Microbiology results: ? 4/27 blood: no growth < 24 hrs to date ? 4/27 respiratory panel: positive for Rhinovirus/Enterovirus ? 4/28 MRSA PCR: neg ? ?Thank you for allowing pharmacy to be a part of this patient?s care. ? ?Arty Baumgartner, RPh ?11/21/2021 12:54 PM ? ?

## 2021-11-21 NOTE — Progress Notes (Signed)
Modified Barium Swallow Progress Note ? ?Patient Details  ?Name: [name] ?MRN: [id number] ?Date of Birth: [birth year] ? ?Today's Date: 11/21/2021 ? ?Modified Barium Swallow completed.  Full report located under Chart Review in the Imaging Section. ? ?Brief recommendations include the following: ? ?Clinical Impression ? Pt presents with oropharyngeal dysphagia felt to be exacerbated from documentation of previous visits given silent aspiration noted of all liquid consistencies tested. Orally she has reduced lingual propulsion, making for slower oral transit and at times mild lingual residue although overall she clears her oral cavity well. Pharyngeall she has reduced base of tongue retraction with subsequent mild vallecular residue. Impaired timing and sensation allow for silent aspiration before and during the swallow with cup sips, and pt does not cough to command or follow commands consistently enough to utilize compensatory strategies. Trace penetration even occurred with a spoonful of puree, and although this did not happen again, she also did not fully clear her laryngeal vestibule. With repeated trials of puree her swallowing appeared to be more "warmed up" with decreased vallecular residue. SLP then provided a variety of liquids by spoon, with aspiration of thin liquids and penetration of nectar thick liquids, which often reached the pyfirorm sinuses before the swallow. Airway protection was improved with spoonfuls of honey thick liquids with swallow initiation more consistently at the valleculae. Given current deconditioning, mentation, and respiratory status, will start more conservatively with Dsy 1 (puree) diet and honey thick liquids by spoon. Will f/u to see what her potential may be to advance with overall improvement in her medical condition vs if this could be declining swallowing function related to medical hx including PD. ?  ?Swallow Evaluation Recommendations ? ?   ? ? SLP Diet  Recommendations: Dysphagia 1 (Puree) solids;Honey thick liquids ? ? Liquid Administration via: Spoon ? ? Medication Administration: Crushed with puree ? ? Supervision: Staff to assist with self feeding;Full supervision/cueing for compensatory strategies ? ? Compensations: Minimize environmental distractions;Slow rate;Small sips/bites ? ? Postural Changes: Seated upright at 90 degrees ? ? Oral Care Recommendations: Oral care BID ? ?   ? ? ? ?Osie Bond., M.A. CCC-SLP ?Acute Rehabilitation Services ?Office [phone] ? ?Secure chat preferred ? ?11/21/2021,3:37 PM ?

## 2021-11-21 NOTE — Progress Notes (Addendum)
?                                  PROGRESS NOTE                                             ?                                                                                                                     ?                                         ? ? Patient Demographics:  ? ? [name], is a 88 y.o. female, DOB - 01/31/1928, EQA:[id number] ? ?Outpatient Primary MD for the patient is [name], MD    LOS - 1  Admit date - 11/20/2021   ? ?Chief Complaint  ?Patient presents with  ? Shortness of Breath  ?    ? ?Brief Narrative (HPI from H&P) -  88 y.o. female with medical history significant of anxiety, asthma, depression, GERD, HTN, NHL, PD, CKD stage 3, remote hx of breast cancer who presented to ED with complaints of  shortness of breath. Her husband states that her symptoms started over the weekend with a productive cough. She started to have shortness of breath on Tuesday and was seen at [facility] on 11/18/21 with concerns for pneumonia. Given dose of rocephin and PO doxycycline. After going back home, shortness of breath seem to progress. This morning her husband states she was short of breath and didn't look like her normal self so they called EMS.  She was tachypneic and hypertensive with oxygenation of 83% on room air. She was placed on NRB.  ?  ? ? Subjective:  ? ? [name] today has, No headache, No chest pain, No abdominal pain - No Nausea, No new weakness tingling or numbness, +ve cough and SOB. ? ? Assessment  & Plan :  ? ? ?Acute hypoxic respiratory failure due to combination of Rhinovirus PNA + Aspiration pneumonia along with acute on chronic diastolic CHF with EF 97% -  ? ?She has ongoing aspiration with severe cachexia and weakness, IV Unasyn, speech monitoring, currently NPO.  High risk for recurrent aspiration due to severe weakness and underlying dysphagia.  Poor long-term prognosis.  Will involve palliative care as well.  Kindly see below  for CHF. ? ?Acute on chronic diastolic CHF EF 98%.  Gently diurese.  As n.p.o. IV fluids only if needed, monitor closely. ?  ?Hypertensive urgency - Due to dysphagia currently n.p.o. as needed IV hydralazine ? ?Recurrent left pleural effusion  - Stable, diurese, antibiotics and monitor. ? ?Mild intermittent asthma - No signs of  exacerbation on exam, no wheezing appreciated, Continue advair, scheduled duonebs. ? ?Chronic kidney disease, stage 3 unspecified (HCC) - Stable, continue to monitor  ? ?Parkinson's disease ([place]) - Continue sinemet IR TID as prescribed, Nutrition consult due to cachexia and poor PO intake, PT eval , SLP. ? ?Anxiety and depression - Continue Zoloft. ? ?Protein-calorie malnutrition, severe - Nutrition consult but currently NPO. ? ?Iron deficiency anemia due to chronic blood loss - hgb stable at baseline of 11-12, continue to monitor . ? ?History of herpes zoster - Continue acyclovir.  ? ? ? ?  ? ?   ? ?Condition - Extremely Guarded ? ?Family Communication  :   ? ?Called son [name] on [phone] 11/21/2021 message left at 11:05 AM ? ?Called husband [name] [phone] on 11/21/2021   ? ?Called son Mitzi Hansen on [phone] 11/21/2021 at 11:06 AM and message left. ? ? ?Code Status :  DNR ? ?Consults  :  Pall Care ? ?PUD Prophylaxis :   ? ? Procedures  :    ? ? TTE ? ?   ? ?Disposition Plan  :   ? ?Status is: Inpatient ? ?DVT Prophylaxis  :   ? ?enoxaparin (LOVENOX) injection 30 mg Start: 11/20/21 2200 ? ? ?Lab Results  ?Component Value Date  ? PLT 133 (L) 11/21/2021  ? ? ?Diet :  ?Diet Order   ? ?       ?  Diet Heart Room service appropriate? Yes; Fluid consistency: Thin  Diet effective now       ?  ? ?  ?  ? ?  ?  ? ?Inpatient Medications ? ?Scheduled Meds: ? carbidopa-levodopa  1 tablet Oral BID  ? carbidopa-levodopa  2 tablet Oral QPM  ? cholecalciferol  1,000 Units Oral QPM  ? enoxaparin (LOVENOX) injection  30 mg Subcutaneous Q24H  ? fluticasone furoate-vilanterol  1 puff Inhalation Daily  ?  furosemide  40 mg Intravenous Once  ? guaiFENesin  600 mg Oral BID  ? sertraline  100 mg Oral Daily  ? ?Continuous Infusions: ?PRN Meds:.acetaminophen **OR** acetaminophen, docusate sodium, hydrALAZINE ? ? ? Time Spent in minutes  30 ? ? ?Lala Lund M.D on 11/21/2021 at 11:05 AM ? ?To page go to [email or web address]  ? ?[facility] -  Office  [phone] ? ?See all Orders from today for further details ? ? ? Objective:  ? ?Vitals:  ? 11/21/21 6283 11/21/21 1517 11/21/21 0751 11/21/21 0754  ?BP:  (!) 148/90  (!) 172/98  ?Pulse:  82  90  ?Resp:    18  ?Temp:  (!) 97.5 ?F (36.4 ?C)  (!) 97.5 ?F (36.4 ?C)  ?TempSrc:  Axillary  Oral  ?SpO2: 100% 99% 99% 99%  ?Weight:      ?Height:      ? ? ?Wt Readings from Last 3 Encounters:  ?11/20/21 44.7 kg  ?11/18/21 44.7 kg  ?06/07/21 44.7 kg  ? ? ? ?Intake/Output Summary (Last 24 hours) at 11/21/2021 1105 ?Last data filed at 11/21/2021 0200 ?Gross per 24 hour  ?Intake 1003.38 ml  ?Output --  ?Net 1003.38 ml  ? ? ? ?Physical Exam ? ?Awake Alert, No new F.N deficits, Normal affect ?[place].AT,PERRAL ?Supple Neck, No JVD,   ?Symmetrical Chest wall movement, Good air movement bilaterally, CTAB ?RRR,No Gallops,Rubs or new Murmurs,  ?+ve B.Sounds, Abd Soft, No tenderness,   ?No Cyanosis, Clubbing or edema  ?  ? ?RN pressure injury documentation: ?  ? ? Data Review:  ? ? ?CBC ?  Recent Labs  ?Lab 11/18/21 ?1135 11/20/21 ?1015 11/21/21 ?0252  ?WBC 4.2 4.9 3.3*  ?HGB 11.4* 11.5* 9.9*  ?HCT 36.0 36.1 30.9*  ?PLT 145* 164 133*  ?MCV 87.0 89.1 88.5  ?MCH 27.5 28.4 28.4  ?MCHC 31.7 31.9 32.0  ?RDW 14.2 14.2 14.5  ?LYMPHSABS 0.8 0.6*  --   ?MONOABS 0.3 0.2  --   ?EOSABS 0.0 0.1  --   ?BASOSABS 0.0 0.0  --   ? ? ?Electrolytes ?Recent Labs  ?Lab 11/18/21 ?1135 11/20/21 ?1015 11/21/21 ?0252 11/21/21 ?0803  ?NA 142 143 142  --   ?K 3.8 4.0 3.6  --   ?CL 105 108 110  --   ?CO2 '29 27 24  '$ --   ?GLUCOSE 105* 112* 99  --   ?BUN '16 15 14  '$ --   ?CREATININE 0.79 0.76 0.66  --   ?CALCIUM 9.6 9.1 8.6*  --   ?AST   --  17  --   --   ?ALT  --  7  --   --   ?ALKPHOS  --  78  --   --   ?BILITOT  --  0.7  --   --   ?ALBUMIN  --  3.4*  --   --   ?CRP  --   --   --  2.8*  ?PROCALCITON  --   --  <0.10  --   ?LATICACIDVEN  --  1.1  --   --   ?BNP  --   --  2,398.2*  --   ? ? ? ?Radiology Reports ?DG Chest Port 1 View ? ?Result Date: 11/21/2021 ?CLINICAL DATA:  Shortness of breath. EXAM: PORTABLE CHEST 1 VIEW COMPARISON:  11/20/2021 FINDINGS: The cardio pericardial silhouette is enlarged. Interstitial markings are diffusely coarsened with chronic features. Left base collapse/consolidation with effusion is similar to prior. Bones are diffusely demineralized. Atherosclerotic calcification noted in the thoracic aorta. IMPRESSION: 1. Stable exam. Left base collapse/consolidation with effusion. 2. Chronic interstitial changes. Electronically Signed   By: Misty Stanley M.D.   On: 11/21/2021 07:58  ? ?DG Chest Portable 1 View ? ?Result Date: 11/20/2021 ?CLINICAL DATA:  Shortness of breath EXAM: PORTABLE CHEST 1 VIEW COMPARISON:  Radiograph 11/18/2021 FINDINGS: Unchanged cardiomediastinal silhouette. Left pleural effusion and adjacent left lower lung consolidation. Postsurgical changes in the right lung base. No visible pneumothorax. No acute osseous abnormality. IMPRESSION: Unchanged left pleural effusion and left lower lung airspace disease concerning for pneumonia Electronically Signed   By: Maurine Simmering M.D.   On: 11/20/2021 10:47  ? ?DG Chest Portable 1 View ? ?Result Date: 11/18/2021 ?CLINICAL DATA:  Productive cough for 5 days EXAM: PORTABLE CHEST 1 VIEW COMPARISON:  06/08/2021 FINDINGS: Cardiac enlargement without heart failure.  Atherosclerotic aorta. Left lower lobe airspace disease has progressed. Small left pleural effusion also has progressed. Surgical staples in the right lung base unchanged. Mild pleural scarring on the right unchanged. IMPRESSION: Progression of left lower lobe airspace disease and left effusion compared with the  prior study. Possible pneumonia. Followup PA and lateral chest X-ray is recommended in 3-4 weeks following trial of antibiotic therapy to ensure resolution and exclude underlying malignancy. Electronically [employer]

## 2021-11-22 ENCOUNTER — Encounter (HOSPITAL_COMMUNITY): Payer: Self-pay | Admitting: Family Medicine

## 2021-11-22 ENCOUNTER — Inpatient Hospital Stay (HOSPITAL_COMMUNITY): Payer: Medicare Other

## 2021-11-22 DIAGNOSIS — G20 Parkinson's disease: Secondary | ICD-10-CM | POA: Diagnosis not present

## 2021-11-22 DIAGNOSIS — I5033 Acute on chronic diastolic (congestive) heart failure: Secondary | ICD-10-CM | POA: Diagnosis not present

## 2021-11-22 DIAGNOSIS — Z66 Do not resuscitate: Secondary | ICD-10-CM

## 2021-11-22 DIAGNOSIS — E43 Unspecified severe protein-calorie malnutrition: Secondary | ICD-10-CM | POA: Diagnosis not present

## 2021-11-22 DIAGNOSIS — J189 Pneumonia, unspecified organism: Secondary | ICD-10-CM | POA: Diagnosis not present

## 2021-11-22 DIAGNOSIS — Z7189 Other specified counseling: Secondary | ICD-10-CM

## 2021-11-22 DIAGNOSIS — Z515 Encounter for palliative care: Secondary | ICD-10-CM

## 2021-11-22 DIAGNOSIS — J9601 Acute respiratory failure with hypoxia: Secondary | ICD-10-CM | POA: Diagnosis not present

## 2021-11-22 LAB — ECHOCARDIOGRAM COMPLETE
AR max vel: 0.93 cm2
AV Area VTI: 1.01 cm2
AV Area mean vel: 0.84 cm2
AV Mean grad: 31.7 mmHg
AV Peak grad: 60.5 mmHg
Ao pk vel: 3.89 m/s
Area-P 1/2: 4.33 cm2
Calc EF: 50.9 %
Height: 63 in
MV VTI: 2.18 cm2
S' Lateral: 2.6 cm
Single Plane A2C EF: 54.8 %
Single Plane A4C EF: 51.6 %
Weight: 1576.73 oz

## 2021-11-22 LAB — CBC WITH DIFFERENTIAL/PLATELET
Abs Immature Granulocytes: 0.03 10*3/uL (ref 0.00–0.07)
Basophils Absolute: 0 10*3/uL (ref 0.0–0.1)
Basophils Relative: 0 %
Eosinophils Absolute: 0 10*3/uL (ref 0.0–0.5)
Eosinophils Relative: 0 %
HCT: 32.4 % — ABNORMAL LOW (ref 36.0–46.0)
Hemoglobin: 10.4 g/dL — ABNORMAL LOW (ref 12.0–15.0)
Immature Granulocytes: 1 %
Lymphocytes Relative: 8 %
Lymphs Abs: 0.5 10*3/uL — ABNORMAL LOW (ref 0.7–4.0)
MCH: 28 pg (ref 26.0–34.0)
MCHC: 32.1 g/dL (ref 30.0–36.0)
MCV: 87.3 fL (ref 80.0–100.0)
Monocytes Absolute: 0.4 10*3/uL (ref 0.1–1.0)
Monocytes Relative: 6 %
Neutro Abs: 5.1 10*3/uL (ref 1.7–7.7)
Neutrophils Relative %: 85 %
Platelets: 163 10*3/uL (ref 150–400)
RBC: 3.71 MIL/uL — ABNORMAL LOW (ref 3.87–5.11)
RDW: 14.2 % (ref 11.5–15.5)
WBC: 6 10*3/uL (ref 4.0–10.5)
nRBC: 0 % (ref 0.0–0.2)

## 2021-11-22 LAB — C-REACTIVE PROTEIN: CRP: 2.9 mg/dL — ABNORMAL HIGH (ref <1.0)

## 2021-11-22 LAB — COMPREHENSIVE METABOLIC PANEL
ALT: <5 U/L (ref 0–44)
AST: 18 U/L (ref 15–41)
Albumin: 3.5 g/dL (ref 3.5–5.0)
Alkaline Phosphatase: 79 U/L (ref 38–126)
Anion gap: 8 (ref 5–15)
BUN: 13 mg/dL (ref 8–23)
CO2: 26 mmol/L (ref 22–32)
Calcium: 9.3 mg/dL (ref 8.9–10.3)
Chloride: 109 mmol/L (ref 98–111)
Creatinine, Ser: 0.75 mg/dL (ref 0.44–1.00)
GFR, Estimated: >60 mL/min (ref >60)
Glucose, Bld: 129 mg/dL — ABNORMAL HIGH (ref 70–99)
Potassium: 3.5 mmol/L (ref 3.5–5.1)
Sodium: 143 mmol/L (ref 135–145)
Total Bilirubin: 0.7 mg/dL (ref 0.3–1.2)
Total Protein: 6.7 g/dL (ref 6.5–8.1)

## 2021-11-22 LAB — BRAIN NATRIURETIC PEPTIDE: B Natriuretic Peptide: >4500 pg/mL — ABNORMAL HIGH (ref 0.0–100.0)

## 2021-11-22 LAB — MAGNESIUM: Magnesium: 1.9 mg/dL (ref 1.7–2.4)

## 2021-11-22 LAB — PROCALCITONIN: Procalcitonin: 0.18 ng/mL

## 2021-11-22 MED ORDER — SODIUM CHLORIDE 0.9 % IV SOLN
INTRAVENOUS | Status: DC | PRN
Start: 2021-11-22 — End: 2021-11-23

## 2021-11-22 MED ORDER — POTASSIUM CHLORIDE 20 MEQ PO PACK
40.0000 meq | PACK | ORAL | Status: AC
  Administered 2021-11-22: 40 meq via ORAL
  Filled 2021-11-22: qty 2

## 2021-11-22 MED ORDER — ACYCLOVIR 400 MG PO TABS
400.0000 mg | ORAL_TABLET | Freq: Two times a day (BID) | ORAL | Status: DC
  Administered 2021-11-22: 400 mg via ORAL
  Filled 2021-11-22 (×4): qty 1

## 2021-11-22 MED ORDER — ORAL CARE MOUTH RINSE
15.0000 mL | Freq: Two times a day (BID) | OROMUCOSAL | Status: DC
  Administered 2021-11-22: 15 mL via OROMUCOSAL

## 2021-11-22 MED ORDER — FUROSEMIDE 10 MG/ML IJ SOLN
40.0000 mg | Freq: Once | INTRAMUSCULAR | Status: AC
  Administered 2021-11-22: 40 mg via INTRAVENOUS
  Filled 2021-11-22: qty 4

## 2021-11-22 MED ORDER — FENTANYL CITRATE PF 50 MCG/ML IJ SOSY
12.5000 ug | PREFILLED_SYRINGE | INTRAMUSCULAR | Status: DC | PRN
  Administered 2021-11-22: 25 ug via INTRAVENOUS
  Filled 2021-11-22: qty 1

## 2021-11-22 NOTE — Progress Notes (Addendum)
?                                  PROGRESS NOTE                                             ?                                                                                                                     ?                                         ? ? Patient Demographics:  ? ? [name], is a 88 y.o. female, DOB - 03-03-1928, CBJ:[id number] ? ?Outpatient Primary MD for the patient is [name], MD    LOS - 2  Admit date - 11/20/2021   ? ?Chief Complaint  ?Patient presents with  ? Shortness of Breath  ?    ? ?Brief Narrative (HPI from H&P) -  88 y.o. female with medical history significant of anxiety, asthma, depression, GERD, HTN, NHL, PD, CKD stage 3, remote hx of breast cancer who presented to ED with complaints of  shortness of breath. Her husband states that her symptoms started over the weekend with a productive cough. She started to have shortness of breath on Tuesday and was seen at [facility] on 11/18/21 with concerns for pneumonia. Given dose of rocephin and PO doxycycline. After going back home, shortness of breath seem to progress. This morning her husband states she was short of breath and didn't look like her normal self so they called EMS.  She was tachypneic and hypertensive with oxygenation of 83% on room air. She was placed on NRB.  ?  ? ? Subjective:  ? ?In bed, weak and frail, gurgling, cannot answer questions or follow commands reliably. ? ? Assessment  & Plan :  ? ? ?Acute hypoxic respiratory failure due to combination of Rhinovirus PNA + Aspiration pneumonia along with acute on chronic diastolic CHF with EF 16% -  ? ?She has ongoing aspiration with severe cachexia and weakness, IV Unasyn, speech monitoring. High risk for recurrent aspiration due to severe weakness and underlying dysphagia.  Poor long-term prognosis.  Speech therapy team as following and currently on dysphagia 3 diet, she continues to decline gradually despite supportive care,  discussed with family in detail on 11/21/2021 and 11/22/2021 if no meaningful recovery or if she appears to be suffering at any point she will be transition to full comfort measures.  Palliative care also consulted ? ?Acute on chronic diastolic CHF EF 07%.  Gently diurese.  As n.p.o. IV fluids only if needed, monitor closely.  Echocardiogram pending. ?  ?Hypertensive urgency -  Due to dysphagia currently n.p.o. as needed IV hydralazine ? ?Recurrent left pleural effusion  - Stable, diurese, antibiotics and monitor. ? ?Mild intermittent asthma - No signs of exacerbation on exam, no wheezing appreciated, Continue advair, scheduled duonebs. ? ?Chronic kidney disease, stage 3 unspecified (HCC) - Stable, continue to monitor  ? ?Parkinson's disease ([place]) - Continue sinemet IR TID as prescribed, Nutrition consult due to cachexia and poor PO intake, PT eval , SLP. ? ?Anxiety and depression - Continue Zoloft. ? ?Protein-calorie malnutrition, severe - Nutrition consult but currently NPO. ? ?Iron deficiency anemia due to chronic blood loss - hgb stable at baseline of 11-12, continue to monitor . ? ?History of herpes zoster - Continue acyclovir.  ? ? ? ?   ? ?Condition - Extremely Guarded ? ?Family Communication  :   ? ?Called son [name] on [phone] 11/21/2021 message left at 11:05 AM ? ?Called husband [name] [phone] on 11/21/2021, 11/22/2021, ? ?Called son Mitzi Hansen on [phone] 11/21/2021 at 11:06 AM and message left. ? ? ?Code Status :  DNR ? ?Consults  :  Pall Care ? ?PUD Prophylaxis :   ? ? Procedures  :    ? ? TTE -  ? ?   ? ?Disposition Plan  :   ? ?Status is: Inpatient ? ?DVT Prophylaxis  :   ? ?enoxaparin (LOVENOX) injection 30 mg Start: 11/20/21 2200 ? ? ?Lab Results  ?Component Value Date  ? PLT 163 11/22/2021  ? ? ?Diet :  ?Diet Order   ? ?       ?  DIET - DYS 1 Room service appropriate? No; Fluid consistency: Honey Thick; Fluid restriction: 1500 mL Fluid  Diet effective now       ?  ? ?  ?  ? ?  ?  ? ?Inpatient  Medications ? ?Scheduled Meds: ? acyclovir  400 mg Oral BID  ? carbidopa-levodopa  1 tablet Oral BID  ? carbidopa-levodopa  2 tablet Oral QPM  ? cholecalciferol  1,000 Units Oral QPM  ? enoxaparin (LOVENOX) injection  30 mg Subcutaneous Q24H  ? fluticasone furoate-vilanterol  1 puff Inhalation Daily  ? furosemide  40 mg Intravenous Once  ? guaiFENesin  600 mg Oral BID  ? metoprolol tartrate  50 mg Oral BID  ? multivitamin with minerals  1 tablet Oral Daily  ? potassium chloride  40 mEq Oral Q4H  ? sertraline  100 mg Oral Daily  ? ?Continuous Infusions: ? ampicillin-sulbactam (UNASYN) IV 3 g (11/22/21 0220)  ? ?PRN Meds:.acetaminophen **OR** acetaminophen, diltiazem, docusate sodium, hydrALAZINE ? ? ? Time Spent in minutes  30 ? ? ?Lala Lund M.D on 11/22/2021 at 10:17 AM ? ?To page go to [email or web address]  ? ?[facility] -  Office  [phone] ? ?See all Orders from today for further details ? ? ? Objective:  ? ?Vitals:  ? 11/22/21 0500 11/22/21 2993 11/22/21 0615 11/22/21 0843  ?BP:    (!) 182/115  ?Pulse: 82 88 85 82  ?Resp:      ?Temp:    (!) 97.3 ?F (36.3 ?C)  ?TempSrc:    Axillary  ?SpO2: 96% 91% 92% 97%  ?Weight:      ?Height:      ? ? ?Wt Readings from Last 3 Encounters:  ?11/20/21 44.7 kg  ?11/18/21 44.7 kg  ?06/07/21 44.7 kg  ? ? ? ?Intake/Output Summary (Last 24 hours) at 11/22/2021 1017 ?Last data filed at 11/22/2021 0400 ?Gross per 24 hour  ?Intake 100  ml  ?Output --  ?Net 100 ml  ? ? ? ?Physical Exam ? ?Very weak frail elderly Caucasian female sitting in hospital bed somewhat confused unable to answer questions, ?[place].AT,PERRAL ?Supple Neck, No JVD,   ?Symmetrical Chest wall movement, coarse bilat breath sounds, upper airway gurgling ?RRR,No Gallops,Rubs or new Murmurs,  ?+ve B.Sounds, Abd Soft, No tenderness,   ?No Cyanosis, Clubbing or edema  ?  ? ? Data Review:  ? ? ?CBC ?Recent Labs  ?Lab 11/18/21 ?1135 11/20/21 ?1015 11/21/21 ?0252 11/22/21 ?0122  ?WBC 4.2 4.9 3.3* 6.0  ?HGB 11.4* 11.5* 9.9*  10.4*  ?HCT 36.0 36.1 30.9* 32.4*  ?PLT 145* 164 133* 163  ?MCV 87.0 89.1 88.5 87.3  ?MCH 27.5 28.4 28.4 28.0  ?MCHC 31.7 31.9 32.0 32.1  ?RDW 14.2 14.2 14.5 14.2  ?LYMPHSABS 0.8 0.6*  --  0.5*  ?MONOABS 0.3 0.2  --  0.4  ?EOSABS 0.0 0.1  --  0.0  ?BASOSABS 0.0 0.0  --  0.0  ? ? ?Electrolytes ?Recent Labs  ?Lab 11/18/21 ?1135 11/20/21 ?1015 11/21/21 ?0252 11/21/21 ?0803 11/22/21 ?0122  ?NA 142 143 142  --  143  ?K 3.8 4.0 3.6  --  3.5  ?CL 105 108 110  --  109  ?CO2 '29 27 24  '$ --  26  ?GLUCOSE 105* 112* 99  --  129*  ?BUN '16 15 14  '$ --  13  ?CREATININE 0.79 0.76 0.66  --  0.75  ?CALCIUM 9.6 9.1 8.6*  --  9.3  ?AST  --  17  --   --  18  ?ALT  --  7  --   --  <5  ?ALKPHOS  --  78  --   --  79  ?BILITOT  --  0.7  --   --  0.7  ?ALBUMIN  --  3.4*  --   --  3.5  ?MG  --   --   --   --  1.9  ?CRP  --   --   --  2.8* 2.9*  ?PROCALCITON  --   --  <0.10  --  0.18  ?LATICACIDVEN  --  1.1  --   --   --   ?BNP  --   --  2,398.2*  --  >4,500.0*  ? ? ? ?Radiology Reports ?DG Chest Port 1 View ? ?Result Date: 11/22/2021 ?CLINICAL DATA:  Pleural effusions. EXAM: PORTABLE CHEST 1 VIEW COMPARISON:  11/21/2021 FINDINGS: The cardio pericardial silhouette is enlarged. Diffuse interstitial opacity again noted with left mid and basilar airspace disease. Small to moderate left and tiny right pleural effusions appear mildly progressed in the interval. Bones are diffusely demineralized. IMPRESSION: Interval progression of left mid and basilar airspace disease with small to moderate left and tiny right pleural effusions. Electronically Signed   By: Misty Stanley M.D.   On: 11/22/2021 08:11  ? ?DG Chest Port 1 View ? ?Result Date: 11/21/2021 ?CLINICAL DATA:  Shortness of breath. EXAM: PORTABLE CHEST 1 VIEW COMPARISON:  11/20/2021 FINDINGS: The cardio pericardial silhouette is enlarged. Interstitial markings are diffusely coarsened with chronic features. Left base collapse/consolidation with effusion is similar to prior. Bones are diffusely  demineralized. Atherosclerotic calcification noted in the thoracic aorta. IMPRESSION: 1. Stable exam. Left base collapse/consolidation with effusion. 2. Chronic interstitial changes. Electronically Signed   By: Verda Cumins

## 2021-11-22 NOTE — Consult Note (Signed)
? ?Palliative Care Consult Note  ?                                ?Date: 11/22/2021  ? ?Patient Name: [name]  ?DOB: 08-Nov-1927  MRN: [id number]  Age / Sex: 88 y.o., female  ?PCP: [name] Frees, MD ?Referring Physician: Thurnell Lose, MD ? ?Reason for Consultation: Establishing goals of care ? ?HPI/Patient Profile: 88 y.o. female  with past medical history of Parkinson's, asthma, CKD stage III, remote history of breast cancer, anxiety, depression, non-Hodgkin's lymphoma, GERD, and hypertension.  She presented to the emergency department on 11/20/2021 with shortness of breath.  On presentation she was tachypneic and hypoxic with saturation of 83% on room air.  She was placed on nonrebreather.  Chest x-ray showed left lower lung airspace disease concerning for pneumonia and unchanged left pleural effusion. ?She is admitted to [facility] with community-acquired pneumonia and acute hypoxic respiratory failure.  ? ? ?Past Medical History:  ?Diagnosis Date  ? Anxiety   ? Asthma   ? Depression   ? Diabetes mellitus without complication ([place])   ? 6/50/35.Marland KitchenMarland Kitchenpt denies  ? GERD (gastroesophageal reflux disease)   ? Hearing loss   ? Hypertension   ? NHL (non-Hodgkin's lymphoma) ([place])   ? nhl dx 9/04 breast ca dx1/12  ? Parkinson disease ([place])   ? ? ?Subjective:  ? ?I have reviewed medical records including EPIC notes, labs and imaging,  and assessed the patient at bedside.  She is tachypneic and appears to be in some discomfort.  She is non-verbal on my assessment, but when I asked if she is having trouble breathing she nods her head "yes".   ? ?I met with husband/[name], son/[name], and daughter-in-law [name] in the waiting room to discuss diagnosis, prognosis, GOC, EOL wishes, disposition, and options ? ?I introduced Palliative Medicine as specialized medical care for people living with serious illness. It focuses on providing relief from the symptoms and stress of a  serious illness.  ? ?We discussed a brief life review of the patient.  [name] and [name] are originally from [place].  They have been married for 56 years.  They have 3 sons together -[name], [name], and [name].  There is also another son [name] from her previous marriage and whom [name] adopted.  ? ?Patient lives at home with her husband.  Prior to admission, she was able to walk only with significant assistance.  She also required assistance to stand and transfer.  She spent most of her day on the couch and sleeping.  She needs assistance with all ADLs with the exception she was able to feed herself.  Family reports she has not been eating well for quite some time. ? ?We discussed his/her current illness and what it means in the larger context of her ongoing co-morbidities. We reviewed that dementia is a progressive, non-curable disease underlying the patient's current acute medical conditions. I provided education on the diagnosis of Parkinson's disease and its natural trajectory. This includes decreased ability to communicate, ambulate, swallow, and maintain continence.  We discussed that [name] appears to have advanced Parkinson's disease. ? ?Discussed that patient has irreversible dysphagia, likely secondary to Parkinson's disease.  Provided education on the continue risk of aspiration, and how this can result in recurrent pneumonia. Discussed current diet recommendations per SLP and concern that patient's current PO intake is not enough to sustain her long term.  ? ?  I attempted to elicit values and goals of care important to the patient.  Her family and her faith are of utmost importance.  Family states she always "puts others first".  She is also a woman of strong [place] faith, which has been a pillar of their family ? ?The difference between full scope medical intervention and comfort care was considered.  I introduced the concept of a comfort path to family, emphasizing that this path involves  de-escalating and stopping full scope medical interventions, allowing a natural course to occur. Discussed that the goal is comfort and dignity rather than cure/prolonging life. Encouraged family to consider at what point they would want to stop full scope medical interventions, keeping in mind the concept of quality of life. Introduced hospice philosophy and provided information on home vs residential hospice services - answered all questions.  ? ?At this time, family wishes to continue current interventions.  They are strongly considering transition to comfort care but need more time to discuss as a family.  Discussion was had regarding patient's discomfort and dyspnea.  Family states they want patient to be "comfortable" and want her to receive medication as needed to relieve pain or dyspnea.  Discussed the need to be cautious with pain medication until decision is made for full comfort. ? ?Questions and concerns were addressed.  The family was encouraged to call with questions or concerns.  ? ? ? ?Review of Systems  ?Unable to perform ROS: Patient nonverbal  ? ?Objective:  ? ?Primary Diagnoses: ?Present on Admission: ? (Resolved) Depression ? (Resolved) HYPERTENSION, BENIGN ? Parkinson's disease ([place]) ? Recurrent left pleural effusion ? Chronic kidney disease, stage 3 unspecified ([place]) ? Anxiety and depression ? (Resolved) GERD (gastroesophageal reflux disease) ? Iron deficiency anemia due to chronic blood loss ? Community acquired pneumonia with acute respiratory failure with hypoxia  ? Hypertensive urgency ? Protein-calorie malnutrition, severe ? ? ?Physical Exam ?Vitals reviewed.  ?Constitutional:   ?   General: She is in acute distress.  ?   Appearance: She is ill-appearing.  ?Pulmonary:  ?   Effort: Tachypnea and accessory muscle usage present.  ?Neurological:  ?   Mental Status: She is alert.  ?   Motor: Weakness present.  ?Psychiatric:     ?   Speech: She is noncommunicative.  ? ? ?Vital Signs:  ?BP  127/84 (BP Location: Right Arm)   Pulse 99   Temp (!) 97.3 ?F (36.3 ?C) (Axillary)   Resp 20   Ht _0  (1.6 m)   Wt 44.7 kg   LMP  (LMP Unknown)   SpO2 91%   BMI 17.46 kg/m?  ? ?Palliative Assessment/Data: PPS 20% ? ? ? ? ?Assessment & Plan:  ? ?SUMMARY OF RECOMMENDATIONS   ?DNR/DNI as previously documented ?Continue all current interventions for now ?Family is considering transition to comfort but need time to discuss ?Plan for follow-up meeting tomorrow morning around 10:30 AM ? ?Primary Decision Maker: ?Husband and sons are working together to make decisions ? ?Symptom Management:  ?Fentanyl 12.5 to 25 mcg IV every 3 hours as needed for pain or shortness of breath ? ?Prognosis:  ?Poor ? ?Discharge Planning:  ?To Be Determined  ? ?Discussed with: Dr. Candiss Norse and bedside RN ? ? ?Thank you for allowing [place] to participate in the care of Lesta Limbert ? ? ?Time Total: 90 minutes ? ? ? ?Signed by: ?Elie Confer, NP ?Palliative Medicine Team ? ?Team Phone # [phone]  ?For individual providers, please see AMION ? ? ? ? ? ? ? ? ? ? ? ? ? ? ? ?

## 2021-11-22 NOTE — Progress Notes (Signed)
*  PRELIMINARY RESULTS* ?Echocardiogram ?2D Echocardiogram has been performed. ? ?[name] ?11/22/2021, 1:33 PM ?

## 2021-11-23 DIAGNOSIS — J9601 Acute respiratory failure with hypoxia: Secondary | ICD-10-CM | POA: Diagnosis not present

## 2021-11-23 DIAGNOSIS — J189 Pneumonia, unspecified organism: Secondary | ICD-10-CM | POA: Diagnosis not present

## 2021-11-23 DIAGNOSIS — N183 Chronic kidney disease, stage 3 unspecified: Secondary | ICD-10-CM

## 2021-11-23 DIAGNOSIS — Z515 Encounter for palliative care: Secondary | ICD-10-CM

## 2021-11-23 DIAGNOSIS — G20 Parkinson's disease: Secondary | ICD-10-CM | POA: Diagnosis not present

## 2021-11-23 DIAGNOSIS — J90 Pleural effusion, not elsewhere classified: Secondary | ICD-10-CM

## 2021-11-23 DIAGNOSIS — E43 Unspecified severe protein-calorie malnutrition: Secondary | ICD-10-CM

## 2021-11-23 LAB — COMPREHENSIVE METABOLIC PANEL
ALT: 9 U/L (ref 0–44)
AST: 21 U/L (ref 15–41)
Albumin: 3.2 g/dL — ABNORMAL LOW (ref 3.5–5.0)
Alkaline Phosphatase: 64 U/L (ref 38–126)
Anion gap: 9 (ref 5–15)
BUN: 18 mg/dL (ref 8–23)
CO2: 27 mmol/L (ref 22–32)
Calcium: 9 mg/dL (ref 8.9–10.3)
Chloride: 110 mmol/L (ref 98–111)
Creatinine, Ser: 0.86 mg/dL (ref 0.44–1.00)
GFR, Estimated: >60 mL/min (ref >60)
Glucose, Bld: 93 mg/dL (ref 70–99)
Potassium: 2.8 mmol/L — ABNORMAL LOW (ref 3.5–5.1)
Sodium: 146 mmol/L — ABNORMAL HIGH (ref 135–145)
Total Bilirubin: 0.8 mg/dL (ref 0.3–1.2)
Total Protein: 6.4 g/dL — ABNORMAL LOW (ref 6.5–8.1)

## 2021-11-23 LAB — BRAIN NATRIURETIC PEPTIDE: B Natriuretic Peptide: >4500 pg/mL — ABNORMAL HIGH (ref 0.0–100.0)

## 2021-11-23 LAB — CBC WITH DIFFERENTIAL/PLATELET
Abs Immature Granulocytes: 0.02 10*3/uL (ref 0.00–0.07)
Basophils Absolute: 0 10*3/uL (ref 0.0–0.1)
Basophils Relative: 1 %
Eosinophils Absolute: 0 10*3/uL (ref 0.0–0.5)
Eosinophils Relative: 0 %
HCT: 31.4 % — ABNORMAL LOW (ref 36.0–46.0)
Hemoglobin: 10.2 g/dL — ABNORMAL LOW (ref 12.0–15.0)
Immature Granulocytes: 1 %
Lymphocytes Relative: 14 %
Lymphs Abs: 0.6 10*3/uL — ABNORMAL LOW (ref 0.7–4.0)
MCH: 28.7 pg (ref 26.0–34.0)
MCHC: 32.5 g/dL (ref 30.0–36.0)
MCV: 88.2 fL (ref 80.0–100.0)
Monocytes Absolute: 0.3 10*3/uL (ref 0.1–1.0)
Monocytes Relative: 8 %
Neutro Abs: 3.3 10*3/uL (ref 1.7–7.7)
Neutrophils Relative %: 76 %
Platelets: 139 10*3/uL — ABNORMAL LOW (ref 150–400)
RBC: 3.56 MIL/uL — ABNORMAL LOW (ref 3.87–5.11)
RDW: 13.8 % (ref 11.5–15.5)
WBC: 4.2 10*3/uL (ref 4.0–10.5)
nRBC: 0 % (ref 0.0–0.2)

## 2021-11-23 LAB — C-REACTIVE PROTEIN: CRP: 3.7 mg/dL — ABNORMAL HIGH (ref <1.0)

## 2021-11-23 LAB — MAGNESIUM: Magnesium: 1.9 mg/dL (ref 1.7–2.4)

## 2021-11-23 LAB — PROCALCITONIN: Procalcitonin: 0.17 ng/mL

## 2021-11-23 MED ORDER — MORPHINE SULFATE (PF) 2 MG/ML IV SOLN
1.0000 mg | INTRAVENOUS | Status: DC
  Administered 2021-11-23 (×2): 1 mg via INTRAVENOUS
  Filled 2021-11-23 (×2): qty 1

## 2021-11-23 MED ORDER — ONDANSETRON HCL 4 MG/2ML IJ SOLN: 4.0000 mg | Freq: Four times a day (QID) | INTRAMUSCULAR | Status: DC | PRN

## 2021-11-23 MED ORDER — BIOTENE DRY MOUTH MT LIQD: 15.0000 mL | OROMUCOSAL | Status: DC | PRN

## 2021-11-23 MED ORDER — GLYCOPYRROLATE 0.2 MG/ML IJ SOLN: 0.2000 mg | INTRAMUSCULAR | Status: DC | PRN

## 2021-11-23 MED ORDER — HALOPERIDOL 0.5 MG PO TABS
0.5000 mg | ORAL_TABLET | ORAL | Status: DC | PRN
  Filled 2021-11-23: qty 1

## 2021-11-23 MED ORDER — POTASSIUM CHLORIDE 20 MEQ PO PACK
40.0000 meq | PACK | ORAL | Status: DC
Start: 2021-11-23 — End: 2021-11-23

## 2021-11-23 MED ORDER — LORAZEPAM 2 MG/ML PO CONC: 1.0000 mg | ORAL | Status: DC | PRN

## 2021-11-23 MED ORDER — LORAZEPAM 2 MG/ML IJ SOLN: 1.0000 mg | INTRAMUSCULAR | Status: DC | PRN

## 2021-11-23 MED ORDER — POLYVINYL ALCOHOL 1.4 % OP SOLN
1.0000 [drp] | Freq: Four times a day (QID) | OPHTHALMIC | Status: DC | PRN
  Filled 2021-11-23: qty 15

## 2021-11-23 MED ORDER — ONDANSETRON 4 MG PO TBDP: 4.0000 mg | ORAL_TABLET | Freq: Four times a day (QID) | ORAL | Status: DC | PRN

## 2021-11-23 MED ORDER — LORAZEPAM 1 MG PO TABS: 1.0000 mg | ORAL_TABLET | ORAL | Status: DC | PRN

## 2021-11-23 MED ORDER — HALOPERIDOL LACTATE 2 MG/ML PO CONC
0.5000 mg | ORAL | Status: DC | PRN
  Filled 2021-11-23: qty 0.3

## 2021-11-23 MED ORDER — HALOPERIDOL LACTATE 5 MG/ML IJ SOLN: 0.5000 mg | INTRAMUSCULAR | Status: DC | PRN

## 2021-11-23 MED ORDER — POTASSIUM CHLORIDE 20 MEQ PO PACK
40.0000 meq | PACK | Freq: Once | ORAL | Status: DC
  Filled 2021-11-23: qty 2

## 2021-11-23 MED ORDER — GLYCOPYRROLATE 1 MG PO TABS
1.0000 mg | ORAL_TABLET | ORAL | Status: DC | PRN
  Filled 2021-11-23: qty 1

## 2021-11-23 MED ORDER — POTASSIUM CHLORIDE 10 MEQ/100ML IV SOLN
10.0000 meq | INTRAVENOUS | Status: AC
  Administered 2021-11-23 (×4): 10 meq via INTRAVENOUS
  Filled 2021-11-23 (×4): qty 100

## 2021-11-23 NOTE — TOC Transition Note (Signed)
Transition of Care (TOC) - CM/SW Discharge Note ? ? ?Patient Details  ?Name: [name] ?MRN: [id number] ?Date of Birth: May 19, 1928 ? ?Transition of Care (TOC) CM/SW Contact:  ?[name], RN ?Phone Number: ?11/23/2021, 2:44 PM ? ? ?Clinical Narrative:   Notified by [facility] that there is a bed available at the [facility]. Referral made to Hospice by Palliative NP.  ? ?Met with family in the room who confirmed plans for hospice discharge.  ?Informed MD who wrote DC order. ? ?PTAR forms and DNR  placed on chart and PTAR called.  ? ?Nurse to call report to [phone]. ? ? ? ?Final next level of care: [place] ?Barriers to Discharge: No Barriers Identified ? ? ?Patient Goals and CMS Choice ?Patient states their goals for this hospitalization and ongoing recovery are:: to go to hospice ?CMS Medicare.gov Compare Post Acute Care list provided to:: Other (Comment Required) ?Choice offered to / list presented to : Spouse ? ?Discharge Placement ?  ?           ?  ?  ?  ?  ? ?Discharge Plan and Services ?  ?  ?           ?  ?  ?  ?  ?  ?  ?  ?  ?  ?  ? ?Social Determinants of Health (SDOH) Interventions ?  ? ? ?Readmission Risk Interventions ?   ? View : No data to display.  ?  ?  ?  ? ? ? ? ? ?

## 2021-11-23 NOTE — Discharge Summary (Addendum)
?                                                                                ? ?[name] KVQ:[id number] DOB: [birth year] DOA: 11/20/2021 ? ?PCP: [name], MD ? ?Admit date: 11/20/2021  Discharge date: 11/23/2021 ? ?Admitted From: Home   Disposition:  Residential Hospice ? ? ?Recommendations for Outpatient Follow-up:  ? ?Follow up with PCP in 1-2 weeks ? ?PCP Please obtain BMP/CBC, 2 view CXR in 1week,  (see Discharge instructions)  ? ?PCP Please follow up on the following pending results:   ? ? ?Home Health: None   ?Equipment/Devices: None  ?Consultations: [place] ?Discharge Condition: Guarded   ?CODE STATUS: DNR ?Diet Recommendation:  ? ?Diet Order   ? ?       ?  DIET - DYS 1 Room service appropriate? No; Fluid consistency: Honey Thick; Fluid restriction: 1500 mL Fluid  Diet effective now       ?  ? ?  ?  ? ?  ?  ? ?Chief Complaint  ?Patient presents with  ? Shortness of Breath  ?  ? ?Brief history of present illness from the day of admission and additional interim summary   ? ?88 y.o. female with medical history significant of anxiety, asthma, depression, GERD, HTN, NHL, PD, CKD stage 3, remote hx of breast cancer who presented to ED with complaints of  shortness of breath. Her husband states that her symptoms started over the weekend with a productive cough. She started to have shortness of breath on Tuesday and was seen at [facility] on 11/18/21 with concerns for pneumonia. Given dose of rocephin and PO doxycycline. After going back home, shortness of breath seem to progress. This morning her husband states she was short of breath and didn't look like her normal self so they called EMS.  She was tachypneic and hypertensive with oxygenation of 83% on room air. She was placed on NRB.  ? ? ?                                                               Hospital Course  ? ? ?Patient is now on full comfort measures,  all known comfort medications will be stopped.  We are looking for residential hospice placement. Other medical issues which were addressed earlier during this medical stay are below - ?  ?  ?  ?  ?Acute hypoxic respiratory failure due to combination of Rhinovirus PNA + Aspiration pneumonia along with acute on chronic diastolic CHF with EF 64% -  ?  ?She has ongoing aspiration with severe cachexia and weakness, IV Unasyn, speech monitoring. High risk for recurrent aspiration due to severe weakness and underlying dysphagia.  Poor long-term prognosis.  Speech therapy team as following and currently on dysphagia 3 diet, she continues to decline gradually despite supportive care, discussed with family in detail on 11/21/2021 ,11/22/2021 and 11/23/2021.  Minimal to no clinical improvement transition to full comfort  measures and look for residential hospice. Use 2lits/ min o2 PRN ?  ?  ?Acute on chronic diastolic CHF EF 02%.  Gently diuresed.      ?Hypertensive urgency - Due to dysphagia currently n.p.o. as needed IV hydralazine ?  ?Recurrent left pleural effusion  - Stable,. ?  ?Mild intermittent asthma - No signs of exacerbation on exam, no wheezing appreciated, Continue advair, PRN duonebs. ?  ?Chronic kidney disease, stage 3 unspecified (HCC) - Stable, continue to monitor  ?  ?Parkinson's disease ([place]) - Continue sinemet IR TID as prescribed, Nutrition consult due to cachexia and poor PO intake, PT eval , SLP. ?  ?Anxiety and depression - Continue Zoloft. ?  ?Protein-calorie malnutrition, severe - comfort care now ?  ?Iron deficiency anemia due to chronic blood loss - hgb stable at baseline of 11-12, continue to monitor . ?  ?History of herpes zoster - Continue acyclovir.  ? ?  ? ? ?Discharge diagnosis   ? ? ?Principal Problem: ?  Community acquired pneumonia with acute respiratory failure with hypoxia  ?Active Problems: ?  Hypertensive urgency ?  Recurrent left pleural effusion ?  Mild intermittent asthma ?  Chronic  kidney disease, stage 3 unspecified ([place]) ?  Parkinson's disease ([place]) ?  Anxiety and depression ?  Protein-calorie malnutrition, severe ?  Personal history of non-Hodgkin lymphomas ?  Iron deficiency anemia due to chronic blood loss ?  Acute respiratory failure with hypoxia ([place]) ?  History of herpes zoster ? ? ? ?Discharge instructions   ? ?Discharge Instructions   ? ? Discharge instructions   Complete by: As directed ?  ? Disposition.  Residential hospice ?Condition.  Guarded ?CODE STATUS.  DNR ?Activity.  With assistance as tolerated, full fall precautions. ?Diet.  Soft with feeding assistance and aspiration precautions. ?Goal of care.  Comfort.  ? ?  ? ? ?Discharge Medications  ? ?Allergies as of 11/23/2021   ? ?   Reactions  ? Azithromycin Other (See Comments)  ? confusion  ? Tramadol Other (See Comments)  ? Hallucinations  ? ?  ? ?  ?Medication List  ?  ? ?STOP taking these medications   ? ?amLODipine 10 MG tablet ?Commonly known as: NORVASC ?  ?CALCIUM-CARB 600 PO ?  ?doxycycline 100 MG capsule ?Commonly known as: VIBRAMYCIN ?  ? ?  ? ?TAKE these medications   ? ?acetaminophen 325 MG tablet ?Commonly known as: TYLENOL ?Take 650 mg by mouth every 6 (six) hours as needed for mild pain, moderate pain, fever or headache. ?  ?acyclovir 400 MG tablet ?Commonly known as: ZOVIRAX ?TAKE 1 TABLET BY MOUTH TWICE DAILY ?  ?carbidopa-levodopa 25-100 MG tablet ?Commonly known as: SINEMET IR ?TAKE 1 TABLET BY MOUTH DAILY EVERY MORNING, 1 IN THE AFTERNOON, AND 2 IN THE EVENING ?What changed: See the new instructions. ?  ?cholecalciferol 1000 units tablet ?Commonly known as: VITAMIN D ?Take 1,000 Units by mouth every evening. ?  ?docusate sodium 100 MG capsule ?Commonly known as: COLACE ?Take 100 mg by mouth daily as needed for mild constipation. ?  ?sertraline 100 MG tablet ?Commonly known as: ZOLOFT ?Take 1 tablet (100 mg total) by mouth daily. ?  ? ?  ? ? ? ? ?Major procedures and Radiology Reports - PLEASE review detailed  and final reports thoroughly  -    ?   ?DG Chest Port 1 View ? ?Result Date: 11/22/2021 ?CLINICAL DATA:  Pleural effusions. EXAM: PORTABLE CHEST 1 VIEW COMPARISON:  11/21/2021 FINDINGS:  The cardio pericardial silhouette is enlarged. Diffuse interstitial opacity again noted with left mid and basilar airspace disease. Small to moderate left and tiny right pleural effusions appear mildly progressed in the interval. Bones are diffusely demineralized. IMPRESSION: Interval progression of left mid and basilar airspace disease with small to moderate left and tiny right pleural effusions. Electronically Signed   By: Misty Stanley M.D.   On: 11/22/2021 08:11  ? ?DG Chest Port 1 View ? ?Result Date: 11/21/2021 ?CLINICAL DATA:  Shortness of breath. EXAM: PORTABLE CHEST 1 VIEW COMPARISON:  11/20/2021 FINDINGS: The cardio pericardial silhouette is enlarged. Interstitial markings are diffusely coarsened with chronic features. Left base collapse/consolidation with effusion is similar to prior. Bones are diffusely demineralized. Atherosclerotic calcification noted in the thoracic aorta. IMPRESSION: 1. Stable exam. Left base collapse/consolidation with effusion. 2. Chronic interstitial changes. Electronically Signed   By: Misty Stanley M.D.   On: 11/21/2021 07:58  ? ?DG Chest Portable 1 View ? ?Result Date: 11/20/2021 ?CLINICAL DATA:  Shortness of breath EXAM: PORTABLE CHEST 1 VIEW COMPARISON:  Radiograph 11/18/2021 FINDINGS: Unchanged cardiomediastinal silhouette. Left pleural effusion and adjacent left lower lung consolidation. Postsurgical changes in the right lung base. No visible pneumothorax. No acute osseous abnormality. IMPRESSION: Unchanged left pleural effusion and left lower lung airspace disease concerning for pneumonia Electronically Signed   By: Maurine Simmering M.D.   On: 11/20/2021 10:47  ? ?DG Chest Portable 1 View ? ?Result Date: 11/18/2021 ?CLINICAL DATA:  Productive cough for 5 days EXAM: PORTABLE CHEST 1 VIEW COMPARISON:   06/08/2021 FINDINGS: Cardiac enlargement without heart failure.  Atherosclerotic aorta. Left lower lobe airspace disease has progressed. Small left pleural effusion also has progressed. Surgical staple

## 2021-11-23 NOTE — Progress Notes (Signed)
?                                                                                                                                                     ?                                                   ?Palliative Medicine Progress Note  ? ?Patient Name: [name]       Date: 11/23/2021 ?DOB: 12-01-1927  Age: 88 y.o. MRN#: [id number] ?Attending Physician: Thurnell Lose, MD ?Primary Care Physician: [name], MD ?Admit Date: 11/20/2021 ? ?Reason for Consultation/Follow-up: Goals of care ? ?HPI/Patient Profile: ?88 y.o. female  with past medical history of Parkinson's, asthma, CKD stage III, remote history of breast cancer, anxiety, depression, non-Hodgkin's lymphoma, GERD, and hypertension.  She presented to the emergency department on 11/20/2021 with shortness of breath.  On presentation she was tachypneic and hypoxic with saturation of 83% on room air.  She was placed on nonrebreather.  Chest x-ray showed left lower lung airspace disease concerning for pneumonia and unchanged left pleural effusion. ?She is admitted to [facility] with community-acquired pneumonia and acute hypoxic respiratory failure.  ? ?Subjective: ?I went to see patient at bedside.  She is alert and able to verbalize, but is confused.  Her breathing continues to appear labored, although slightly less compared to yesterday.  She is able to take some bites and sips from her tray with assistance from family. ? ?I met with her family again outside the room.  Discussed that even though patient appears slightly improved compared to yesterday, this does not change her overall trajectory.  Emphasized the ongoing risk for aspiration.  Discussed her frail condition at baseline. ? ?The difference between full scope medical intervention and comfort care was discussed.  We reviewed the concept of a comfort path, emphasizing that this path involves de-escalating and stopping full scope medical interventions, allowing a natural course to occur.  Discussed that the goal is comfort and dignity rather than cure/prolonging life.  ? ?Family is tearful as they acknowledge that Arliene's time is likely quite limited.  They ultimately decide they do not want to prolong the end-of-life process for her.  They also agree they want to ensure that she is comfortable.  ? ?Discussed transitioning to comfort care in the hospital, and what that would look like--keeping her clean and dry, no labs, no artificial hydration or feeding, no antibiotics, minimizing of medications, comfort feeds, and medication for pain and dyspnea.  Discussed starting low-dose scheduled morphine to help relieve patient's dyspnea - family verbalizes understanding and agrees. ? ?Family request that referral is made to hospice facility in [facility].  They are hopeful that she can transfer there as soon as possible for a more peaceful setting at end-of-life.  Provided education on the natural trajectory at end-of-life.  Emotional support provided. ? ? ?Objective: ? ?Physical Exam ?Vitals reviewed.  ?Constitutional:   ?   General: She is not in acute distress. ?   Appearance: She is cachectic. She is ill-appearing.  ?Pulmonary:  ?   Effort: Tachypnea present.  ?Neurological:  ?   Mental Status: She is alert. She is confused.  ?   Motor: Weakness present.  ?         ? ?Vital Signs: BP (!) 157/89 (BP Location: Right Arm)   Pulse 90   Temp 97.6 ?F (36.4 ?C) (Axillary)   Resp 17   Ht _0  (1.6 m)   Wt 44.7 kg   LMP  (LMP Unknown)   SpO2 97%   BMI 17.46 kg/m?  ?SpO2: SpO2: 97 % ?O2 Device: O2 Device: Room Air ?O2 Flow Rate: O2 Flow Rate (L/min): 2 L/min ? ? ?LBM: Last BM Date :  (UTA) ? ?   ?Palliative Assessment/Data: PPS 20% ? ? ? ? ?Palliative Medicine Assessment & Plan  ? ?Assessment: ?Principal Problem: ?  Community acquired pneumonia with acute respiratory failure with hypoxia  ?Active Problems: ?  Parkinson's disease ([place]) ?  Hypertensive urgency ?  Recurrent left pleural effusion ?   Protein-calorie malnutrition, severe ?  Chronic kidney disease, stage 3 unspecified ([place]) ?  Anxiety and depression ?  Iron deficiency anemia due to chronic blood loss ?  Personal history of non-Hodgkin lymphomas ?  Acute respiratory failure with hypoxia ([place]) ?  History of herpes zoster ?  Mild intermittent asthma ?  ? ?Recommendations/Plan: ?Full comfort measures initiated ?Referral made to hospice facility in [place] consult placed; TOC and hospice liaison notifed ?Added orders for symptom management at EOL  ?Discontinued orders that were not focused on comfort ?Discontinued antibiotics ?Minimize medications ?PMT will continue to follow ? ?Symptom Management:  ?Morphine 1 mg IV every 4 hours ?Lorazepam (ATIVAN) prn for anxiety ?Haloperidol (HALDOL) prn for agitation  ?Glycopyrrolate (ROBINUL) for excessive secretions ?Ondansetron (ZOFRAN) prn for nausea ?Polyvinyl alcohol (LIQUIFILM TEARS) prn for dry eyes ?Antiseptic oral rinse (BIOTENE) prn for dry mouth ? ? ?Code Status: DNR/DNI as previously documented ? ?Prognosis: ? < 2 weeks ? ?Discharge Planning: ?Hospice facility ? ?Care plan was discussed with Dr. Candiss Norse, [facility] team, bedside nurse, and hospice liaison ? ?Thank you for allowing the Palliative Medicine Team to assist in the care of this patient. ? ? ?MDM - high ? ? ?Lavena Bullion, NP ? ? ?Please contact Palliative Medicine Team phone at [phone] for questions and concerns.  ?For individual providers, please see AMION. ? ? ? ? ? ?

## 2021-11-23 NOTE — Progress Notes (Signed)
?                                  PROGRESS NOTE                                             ?                                                                                                                     ?                                         ? ? Patient Demographics:  ? ? [name], is a 88 y.o. female, DOB - 1928/05/05, XNA:[id number] ? ?Outpatient Primary MD for the patient is [name], MD    LOS - 3  Admit date - 11/20/2021   ? ?Chief Complaint  ?Patient presents with  ? Shortness of Breath  ?    ? ?Brief Narrative (HPI from H&P) -  88 y.o. female with medical history significant of anxiety, asthma, depression, GERD, HTN, NHL, PD, CKD stage 3, remote hx of breast cancer who presented to ED with complaints of  shortness of breath. Her husband states that her symptoms started over the weekend with a productive cough. She started to have shortness of breath on Tuesday and was seen at [facility] on 11/18/21 with concerns for pneumonia. Given dose of rocephin and PO doxycycline. After going back home, shortness of breath seem to progress. This morning her husband states she was short of breath and didn't look like her normal self so they called EMS.  She was tachypneic and hypertensive with oxygenation of 83% on room air. She was placed on NRB.  ?  ? ? Subjective:  ? ?In bed, weak and frail, gurgling, cannot answer questions or follow commands reliably. ? ? Assessment  & Plan :  ? ?Patient is now on full comfort measures, all known comfort medications will be stopped.  We are looking for residential hospice placement if patient survives the next 24 hours.  Other medical issues which were addressed earlier during this medical stay are below - ? ? ? ? ?Acute hypoxic respiratory failure due to combination of Rhinovirus PNA + Aspiration pneumonia along with acute on chronic diastolic CHF with EF 54% -  ? ?She has ongoing aspiration with severe cachexia and weakness, IV  Unasyn, speech monitoring. High risk for recurrent aspiration due to severe weakness and underlying dysphagia.  Poor long-term prognosis.  Speech therapy team as following and currently on dysphagia 3 diet, she continues to decline gradually despite supportive care, discussed with family in detail on 11/21/2021 ,11/22/2021 and 11/23/2021.  Minimal to no clinical improvement transition  to full comfort measures and look for residential hospice. ? ? ?Acute on chronic diastolic CHF EF 49%.  Gently diurese.  As n.p.o. IV fluids only if needed, monitor closely.  Echocardiogram pending. ?  ?Hypertensive urgency - Due to dysphagia currently n.p.o. as needed IV hydralazine ? ?Recurrent left pleural effusion  - Stable, diurese, antibiotics and monitor. ? ?Mild intermittent asthma - No signs of exacerbation on exam, no wheezing appreciated, Continue advair, scheduled duonebs. ? ?Chronic kidney disease, stage 3 unspecified (HCC) - Stable, continue to monitor  ? ?Parkinson's disease ([place]) - Continue sinemet IR TID as prescribed, Nutrition consult due to cachexia and poor PO intake, PT eval , SLP. ? ?Anxiety and depression - Continue Zoloft. ? ?Protein-calorie malnutrition, severe - Nutrition consult but currently NPO. ? ?Iron deficiency anemia due to chronic blood loss - hgb stable at baseline of 11-12, continue to monitor . ? ?History of herpes zoster - Continue acyclovir.  ? ? ? ?   ? ?Condition - Extremely Guarded ? ?Family Communication  :   ? ?Called son [name] on [phone] 11/21/2021 message left at 11:05 AM ? ?Called husband [name] [phone] on 11/21/2021, 11/22/2021, 11/23/2021 ? ?Called son Mitzi Hansen on [phone] 11/21/2021 at 11:06 AM and message left. ? ? ?Code Status :  DNR ? ?Consults  :  Pall Care ? ?PUD Prophylaxis :   ? ? Procedures  :    ? ? TTE - 1. In 88 yo DNR severe MAC without MS mild MR and moderate AS with normal EF does not need caridolgy consulting Not a TAVR candidate.  2. Left ventricular ejection fraction,  by estimation, is 55 to 60%. The left ventricle has normal function. The left ventricle has no regional wall motion abnormalities. There is moderate left ventricular hypertrophy. Left ventricular diastolic parameters are indeterminate.  3. Right ventricular systolic function is normal. The right ventricular size is normal. There is normal pulmonary artery systolic pressure.  4. Left atrial size was moderately dilated.  5. Small gradient across MV from severe MAC but no MS by PT 1/2 at HR 86 bpm. The mitral valve is degenerative. Mild mitral valve regurgitation. No evidence of mitral stenosis. Severe mitral annular calcification.  6. The aortic valve is tricuspid. There is moderate calcification of the aortic valve. There is moderate thickening of the aortic valve. Aortic valve regurgitation is not visualized. No aortic stenosis is present.  7. The inferior vena cava is normal in size with greater than 50% respiratory variability, suggesting right atrial pressure of 3 mmHg. ? ?   ? ?Disposition Plan  :   ? ?Status is: Inpatient ? ?DVT Prophylaxis  :   ? ? ? ? ?Lab Results  ?Component Value Date  ? PLT 139 (L) 11/23/2021  ? ? ?Diet :  ?Diet Order   ? ?       ?  DIET - DYS 1 Room service appropriate? No; Fluid consistency: Honey Thick; Fluid restriction: 1500 mL Fluid  Diet effective now       ?  ? ?  ?  ? ?  ?  ? ?Inpatient Medications ? ?Scheduled Meds: ? acyclovir  400 mg Oral BID  ? carbidopa-levodopa  1 tablet Oral BID  ? carbidopa-levodopa  2 tablet Oral QPM  ? fluticasone furoate-vilanterol  1 puff Inhalation Daily  ? guaiFENesin  600 mg Oral BID  ? mouth rinse  15 mL Mouth Rinse BID  ? metoprolol tartrate  50 mg Oral BID  ?  morphine injection  1 mg Intravenous Q4H  ? sertraline  100 mg Oral Daily  ? ?Continuous Infusions: ? sodium chloride Stopped (11/22/21 1629)  ? ampicillin-sulbactam (UNASYN) IV Stopped (11/23/21 0203)  ? potassium chloride 10 mEq (11/23/21 0825)  ? ?PRN Meds:.sodium chloride, acetaminophen  **OR** acetaminophen, antiseptic oral rinse, diltiazem, docusate sodium, glycopyrrolate **OR** glycopyrrolate **OR** glycopyrrolate, haloperidol **OR** haloperidol **OR** haloperidol lactate, hydrALAZINE, LORazepam **OR** LORazepam **OR** LORazepam, ondansetron **OR** ondansetron (ZOFRAN) IV, polyvinyl alcohol ? ? ? Time Spent in minutes  30 ? ? ?Lala Lund M.D on 11/23/2021 at 10:57 AM ? ?To page go to [email or web address]  ? ?[facility] -  Office  [phone] ? ?See all Orders from today for further details ? ? ? Objective:  ? ?Vitals:  ? 11/22/21 2016 11/22/21 2329 11/23/21 0323 11/23/21 0700  ?BP: (!) 142/80 (!) 154/82 112/67 (!) 154/95  ?Pulse: 78 74 76 90  ?Resp: _0 ?Temp: 97.8 ?F (36.6 ?C) 97.9 ?F (36.6 ?C) 98.1 ?F (36.7 ?C) 97.6 ?F (36.4 ?C)  ?TempSrc: Axillary Axillary Axillary Axillary  ?SpO2: 97% 97% 97% 98%  ?Weight:      ?Height:      ? ? ?Wt Readings from Last 3 Encounters:  ?11/20/21 44.7 kg  ?11/18/21 44.7 kg  ?06/07/21 44.7 kg  ? ? ? ?Intake/Output Summary (Last 24 hours) at 11/23/2021 1057 ?Last data filed at 11/23/2021 0407 ?Gross per 24 hour  ?Intake 309.5 ml  ?Output 1500 ml  ?Net -1190.5 ml  ? ? ? ?Physical Exam ? ?Very weak frail elderly Caucasian female sitting in hospital bed somewhat confused unable to answer questions, ?[place].AT,PERRAL ?Supple Neck, No JVD,   ?Symmetrical Chest wall movement, Good air movement bilaterally, CTAB ?RRR,No Gallops, Rubs or new Murmurs,  ?+ve B.Sounds, Abd Soft, No tenderness,   ?No Cyanosis, Clubbing or edema  ? ?  ? ? Data Review:  ? ? ?CBC ?Recent Labs  ?Lab 11/18/21 ?1135 11/20/21 ?1015 11/21/21 ?0252 11/22/21 ?0122 11/23/21 ?1224  ?WBC 4.2 4.9 3.3* 6.0 4.2  ?HGB 11.4* 11.5* 9.9* 10.4* 10.2*  ?HCT 36.0 36.1 30.9* 32.4* 31.4*  ?PLT 145* 164 133* 163 139*  ?MCV 87.0 89.1 88.5 87.3 88.2  ?MCH 27.5 28.4 28.4 28.0 28.7  ?MCHC 31.7 31.9 32.0 32.1 32.5  ?RDW 14.2 14.2 14.5 14.2 13.8  ?LYMPHSABS 0.8 0.6*  --  0.5* 0.6*  ?MONOABS 0.3 0.2  --  0.4 0.3   ?EOSABS 0.0 0.1  --  0.0 0.0  ?BASOSABS 0.0 0.0  --  0.0 0.0  ? ? ?Electrolytes ?Recent Labs  ?Lab 11/18/21 ?1135 11/20/21 ?1015 11/21/21 ?0252 11/21/21 ?0803 11/22/21 ?0122 11/23/21 ?8250  ?NA 142 143 142

## 2021-11-23 NOTE — Discharge Instructions (Signed)
Disposition.  Residential hospice °Condition.  Guarded °CODE STATUS.  DNR °Activity.  With assistance as tolerated, full fall precautions. °Diet.  Soft with feeding assistance and aspiration precautions. °Goal of care.  Comfort. ° °

## 2021-11-24 DIAGNOSIS — G20 Parkinson's disease: Secondary | ICD-10-CM | POA: Diagnosis not present

## 2021-11-24 DIAGNOSIS — E43 Unspecified severe protein-calorie malnutrition: Secondary | ICD-10-CM | POA: Diagnosis not present

## 2021-11-24 DIAGNOSIS — J690 Pneumonitis due to inhalation of food and vomit: Secondary | ICD-10-CM | POA: Diagnosis not present

## 2021-11-24 DIAGNOSIS — R1310 Dysphagia, unspecified: Secondary | ICD-10-CM | POA: Diagnosis not present

## 2021-11-24 DIAGNOSIS — I10 Essential (primary) hypertension: Secondary | ICD-10-CM | POA: Diagnosis not present

## 2021-11-24 DIAGNOSIS — I5033 Acute on chronic diastolic (congestive) heart failure: Secondary | ICD-10-CM | POA: Diagnosis not present

## 2021-11-24 DIAGNOSIS — J90 Pleural effusion, not elsewhere classified: Secondary | ICD-10-CM | POA: Diagnosis not present

## 2021-11-24 DIAGNOSIS — J9601 Acute respiratory failure with hypoxia: Secondary | ICD-10-CM | POA: Diagnosis not present

## 2021-11-27 LAB — CULTURE, BLOOD (ROUTINE X 2)
Culture: NO GROWTH
Culture: NO GROWTH

## 2021-12-25 DEATH — deceased
